# Patient Record
Sex: Female | Born: 1950 | Race: White | Hispanic: No | Marital: Married | Smoking: Former smoker
Health system: Southern US, Community
[De-identification: ages and names within clinical notes are randomized; demographics above are authoritative.]

## PROBLEM LIST (undated history)

## (undated) ENCOUNTER — Emergency Department (HOSPITAL_COMMUNITY): Admission: EM | Discharge status: Absent without leave | Payer: Medicare Other

## (undated) DIAGNOSIS — D509 Iron deficiency anemia, unspecified: Secondary | ICD-10-CM

## (undated) DIAGNOSIS — T4145XA Adverse effect of unspecified anesthetic, initial encounter: Secondary | ICD-10-CM

## (undated) DIAGNOSIS — G5603 Carpal tunnel syndrome, bilateral upper limbs: Secondary | ICD-10-CM

## (undated) DIAGNOSIS — C649 Malignant neoplasm of unspecified kidney, except renal pelvis: Secondary | ICD-10-CM

## (undated) DIAGNOSIS — F32A Depression, unspecified: Secondary | ICD-10-CM

## (undated) DIAGNOSIS — IMO0002 Reserved for concepts with insufficient information to code with codable children: Secondary | ICD-10-CM

## (undated) DIAGNOSIS — N189 Chronic kidney disease, unspecified: Secondary | ICD-10-CM

## (undated) DIAGNOSIS — D649 Anemia, unspecified: Secondary | ICD-10-CM

## (undated) DIAGNOSIS — Z8679 Personal history of other diseases of the circulatory system: Secondary | ICD-10-CM

## (undated) DIAGNOSIS — I872 Venous insufficiency (chronic) (peripheral): Secondary | ICD-10-CM

## (undated) DIAGNOSIS — M1990 Unspecified osteoarthritis, unspecified site: Secondary | ICD-10-CM

## (undated) DIAGNOSIS — I2510 Atherosclerotic heart disease of native coronary artery without angina pectoris: Secondary | ICD-10-CM

## (undated) DIAGNOSIS — E039 Hypothyroidism, unspecified: Secondary | ICD-10-CM

## (undated) DIAGNOSIS — C4359 Malignant melanoma of other part of trunk: Secondary | ICD-10-CM

## (undated) DIAGNOSIS — R079 Chest pain, unspecified: Secondary | ICD-10-CM

## (undated) DIAGNOSIS — F329 Major depressive disorder, single episode, unspecified: Secondary | ICD-10-CM

## (undated) DIAGNOSIS — S72001A Fracture of unspecified part of neck of right femur, initial encounter for closed fracture: Secondary | ICD-10-CM

## (undated) DIAGNOSIS — F419 Anxiety disorder, unspecified: Secondary | ICD-10-CM

## (undated) DIAGNOSIS — R51 Headache: Secondary | ICD-10-CM

## (undated) DIAGNOSIS — M545 Low back pain, unspecified: Secondary | ICD-10-CM

## (undated) DIAGNOSIS — I509 Heart failure, unspecified: Secondary | ICD-10-CM

## (undated) DIAGNOSIS — C3490 Malignant neoplasm of unspecified part of unspecified bronchus or lung: Secondary | ICD-10-CM

## (undated) DIAGNOSIS — J189 Pneumonia, unspecified organism: Secondary | ICD-10-CM

## (undated) DIAGNOSIS — T8859XA Other complications of anesthesia, initial encounter: Secondary | ICD-10-CM

## (undated) DIAGNOSIS — G8929 Other chronic pain: Secondary | ICD-10-CM

## (undated) DIAGNOSIS — M797 Fibromyalgia: Secondary | ICD-10-CM

## (undated) DIAGNOSIS — R0600 Dyspnea, unspecified: Secondary | ICD-10-CM

## (undated) DIAGNOSIS — Z8543 Personal history of malignant neoplasm of ovary: Secondary | ICD-10-CM

## (undated) DIAGNOSIS — G43909 Migraine, unspecified, not intractable, without status migrainosus: Secondary | ICD-10-CM

## (undated) DIAGNOSIS — F0390 Unspecified dementia without behavioral disturbance: Secondary | ICD-10-CM

## (undated) HISTORY — PX: MELANOMA EXCISION: SHX5266

## (undated) HISTORY — DX: Anxiety disorder, unspecified: F41.9

## (undated) HISTORY — DX: Unspecified osteoarthritis, unspecified site: M19.90

## (undated) HISTORY — PX: APPENDECTOMY: SHX54

## (undated) HISTORY — DX: Chronic kidney disease, unspecified: N18.9

## (undated) HISTORY — PX: TOTAL KNEE ARTHROPLASTY: SHX125

## (undated) HISTORY — PX: TOTAL SHOULDER REPLACEMENT: SUR1217

## (undated) HISTORY — PX: TUBAL LIGATION: SHX77

## (undated) HISTORY — DX: Anemia, unspecified: D64.9

## (undated) HISTORY — PX: KNEE ARTHROSCOPY: SHX127

## (undated) HISTORY — PX: SHOULDER ARTHROSCOPY W/ ROTATOR CUFF REPAIR: SHX2400

## (undated) HISTORY — PX: ORIF HIP FRACTURE: SHX2125

## (undated) HISTORY — PX: POSTERIOR LUMBAR FUSION: SHX6036

## (undated) HISTORY — PX: REDUCTION MAMMAPLASTY: SUR839

## (undated) HISTORY — PX: FRACTURE SURGERY: SHX138

## (undated) HISTORY — DX: Heart failure, unspecified: I50.9

## (undated) HISTORY — PX: PARTIAL NEPHRECTOMY: SHX414

## (undated) HISTORY — PX: RIGHT OOPHORECTOMY: SHX2359

## (undated) HISTORY — PX: PERICARDIAL WINDOW: SHX2213

---
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
## 1977-10-23 HISTORY — PX: ABDOMINAL HYSTERECTOMY: SHX81

## 1977-10-23 HISTORY — PX: LEFT OOPHORECTOMY: SHX1961

## 1982-10-23 HISTORY — PX: LUMBAR DISC SURGERY: SHX700

## 1995-08-15 ENCOUNTER — Encounter: Payer: Self-pay | Admitting: Internal Medicine

## 1995-08-17 ENCOUNTER — Encounter: Payer: Self-pay | Admitting: Internal Medicine

## 1998-02-11 ENCOUNTER — Other Ambulatory Visit: Admission: RE | Admit: 1998-02-11 | Discharge: 1998-02-11 | Payer: Self-pay | Admitting: Sports Medicine

## 1998-02-22 ENCOUNTER — Other Ambulatory Visit: Admission: RE | Admit: 1998-02-22 | Discharge: 1998-02-22 | Payer: Self-pay | Admitting: Obstetrics and Gynecology

## 1998-03-08 ENCOUNTER — Ambulatory Visit (HOSPITAL_COMMUNITY): Admission: RE | Admit: 1998-03-08 | Discharge: 1998-03-08 | Payer: Self-pay | Admitting: Orthopedic Surgery

## 1998-03-16 ENCOUNTER — Ambulatory Visit (HOSPITAL_COMMUNITY): Admission: RE | Admit: 1998-03-16 | Discharge: 1998-03-16 | Payer: Self-pay | Admitting: Orthopedic Surgery

## 1998-04-19 ENCOUNTER — Ambulatory Visit (HOSPITAL_COMMUNITY): Admission: RE | Admit: 1998-04-19 | Discharge: 1998-04-19 | Payer: Self-pay | Admitting: Orthopedic Surgery

## 1998-04-27 ENCOUNTER — Ambulatory Visit (HOSPITAL_COMMUNITY): Admission: RE | Admit: 1998-04-27 | Discharge: 1998-04-27 | Payer: Self-pay | Admitting: Orthopedic Surgery

## 1998-06-14 ENCOUNTER — Ambulatory Visit (HOSPITAL_COMMUNITY): Admission: RE | Admit: 1998-06-14 | Discharge: 1998-06-14 | Payer: Self-pay | Admitting: Neurosurgery

## 1998-10-19 ENCOUNTER — Other Ambulatory Visit: Admission: RE | Admit: 1998-10-19 | Discharge: 1998-10-19 | Payer: Self-pay | Admitting: Obstetrics and Gynecology

## 1999-01-21 ENCOUNTER — Other Ambulatory Visit: Admission: RE | Admit: 1999-01-21 | Discharge: 1999-01-21 | Payer: Self-pay | Admitting: *Deleted

## 1999-07-10 ENCOUNTER — Ambulatory Visit (HOSPITAL_COMMUNITY)
Admission: RE | Admit: 1999-07-10 | Discharge: 1999-07-10 | Payer: Self-pay | Admitting: Physical Medicine & Rehabilitation

## 1999-07-10 ENCOUNTER — Encounter: Payer: Self-pay | Admitting: Physical Medicine & Rehabilitation

## 1999-08-26 ENCOUNTER — Encounter: Payer: Self-pay | Admitting: Internal Medicine

## 1999-09-24 ENCOUNTER — Ambulatory Visit (HOSPITAL_COMMUNITY)
Admission: RE | Admit: 1999-09-24 | Discharge: 1999-09-24 | Payer: Self-pay | Admitting: Physical Medicine & Rehabilitation

## 1999-09-24 ENCOUNTER — Encounter: Payer: Self-pay | Admitting: Physical Medicine & Rehabilitation

## 1999-10-12 ENCOUNTER — Other Ambulatory Visit: Admission: RE | Admit: 1999-10-12 | Discharge: 1999-10-12 | Payer: Self-pay | Admitting: Obstetrics and Gynecology

## 2000-02-28 ENCOUNTER — Ambulatory Visit (HOSPITAL_COMMUNITY)
Admission: RE | Admit: 2000-02-28 | Discharge: 2000-02-28 | Payer: Self-pay | Admitting: Physical Medicine & Rehabilitation

## 2000-02-28 ENCOUNTER — Encounter: Payer: Self-pay | Admitting: Physical Medicine & Rehabilitation

## 2000-04-10 ENCOUNTER — Encounter: Payer: Self-pay | Admitting: Internal Medicine

## 2000-04-12 ENCOUNTER — Encounter (INDEPENDENT_AMBULATORY_CARE_PROVIDER_SITE_OTHER): Payer: Self-pay | Admitting: Specialist

## 2000-04-12 ENCOUNTER — Encounter (INDEPENDENT_AMBULATORY_CARE_PROVIDER_SITE_OTHER): Payer: Self-pay | Admitting: *Deleted

## 2000-04-12 ENCOUNTER — Encounter: Payer: Self-pay | Admitting: Internal Medicine

## 2000-04-12 ENCOUNTER — Other Ambulatory Visit: Admission: RE | Admit: 2000-04-12 | Discharge: 2000-04-12 | Payer: Self-pay | Admitting: Internal Medicine

## 2000-04-13 ENCOUNTER — Encounter: Payer: Self-pay | Admitting: Internal Medicine

## 2000-04-13 ENCOUNTER — Ambulatory Visit (HOSPITAL_COMMUNITY): Admission: RE | Admit: 2000-04-13 | Discharge: 2000-04-13 | Payer: Self-pay | Admitting: Internal Medicine

## 2000-04-13 ENCOUNTER — Encounter (INDEPENDENT_AMBULATORY_CARE_PROVIDER_SITE_OTHER): Payer: Self-pay | Admitting: *Deleted

## 2000-05-25 ENCOUNTER — Emergency Department (HOSPITAL_COMMUNITY): Admission: EM | Admit: 2000-05-25 | Discharge: 2000-05-25 | Payer: Self-pay | Admitting: Emergency Medicine

## 2000-05-25 ENCOUNTER — Encounter: Payer: Self-pay | Admitting: Emergency Medicine

## 2000-07-27 ENCOUNTER — Encounter: Payer: Self-pay | Admitting: Physical Medicine & Rehabilitation

## 2000-07-27 ENCOUNTER — Ambulatory Visit (HOSPITAL_COMMUNITY)
Admission: RE | Admit: 2000-07-27 | Discharge: 2000-07-27 | Payer: Self-pay | Admitting: Physical Medicine & Rehabilitation

## 2000-07-27 ENCOUNTER — Encounter (INDEPENDENT_AMBULATORY_CARE_PROVIDER_SITE_OTHER): Payer: Self-pay | Admitting: *Deleted

## 2000-08-07 ENCOUNTER — Ambulatory Visit (HOSPITAL_COMMUNITY): Admission: RE | Admit: 2000-08-07 | Discharge: 2000-08-07 | Payer: Self-pay | Admitting: Hematology & Oncology

## 2000-08-07 ENCOUNTER — Encounter (INDEPENDENT_AMBULATORY_CARE_PROVIDER_SITE_OTHER): Payer: Self-pay | Admitting: *Deleted

## 2000-12-07 ENCOUNTER — Other Ambulatory Visit: Admission: RE | Admit: 2000-12-07 | Discharge: 2000-12-07 | Payer: Self-pay | Admitting: Obstetrics and Gynecology

## 2001-01-26 ENCOUNTER — Ambulatory Visit (HOSPITAL_COMMUNITY)
Admission: RE | Admit: 2001-01-26 | Discharge: 2001-01-26 | Payer: Self-pay | Admitting: Physical Medicine & Rehabilitation

## 2001-01-26 ENCOUNTER — Encounter: Payer: Self-pay | Admitting: Physical Medicine & Rehabilitation

## 2001-02-28 ENCOUNTER — Encounter
Admission: RE | Admit: 2001-02-28 | Discharge: 2001-02-28 | Payer: Self-pay | Admitting: Physical Medicine & Rehabilitation

## 2001-02-28 ENCOUNTER — Encounter: Payer: Self-pay | Admitting: Physical Medicine & Rehabilitation

## 2001-03-20 ENCOUNTER — Encounter: Payer: Self-pay | Admitting: Physical Medicine & Rehabilitation

## 2001-03-20 ENCOUNTER — Encounter
Admission: RE | Admit: 2001-03-20 | Discharge: 2001-03-20 | Payer: Self-pay | Admitting: Physical Medicine & Rehabilitation

## 2001-08-12 ENCOUNTER — Ambulatory Visit (HOSPITAL_COMMUNITY)
Admission: RE | Admit: 2001-08-12 | Discharge: 2001-08-12 | Payer: Self-pay | Admitting: Physical Medicine & Rehabilitation

## 2001-08-12 ENCOUNTER — Encounter: Payer: Self-pay | Admitting: Physical Medicine & Rehabilitation

## 2001-10-26 ENCOUNTER — Encounter: Payer: Self-pay | Admitting: Physical Medicine & Rehabilitation

## 2001-10-26 ENCOUNTER — Ambulatory Visit (HOSPITAL_COMMUNITY)
Admission: RE | Admit: 2001-10-26 | Discharge: 2001-10-26 | Payer: Self-pay | Admitting: Physical Medicine & Rehabilitation

## 2002-01-06 ENCOUNTER — Inpatient Hospital Stay (HOSPITAL_COMMUNITY): Admission: RE | Admit: 2002-01-06 | Discharge: 2002-01-11 | Payer: Self-pay | Admitting: Neurological Surgery

## 2002-01-06 ENCOUNTER — Encounter: Payer: Self-pay | Admitting: Neurological Surgery

## 2002-05-06 ENCOUNTER — Encounter
Admission: RE | Admit: 2002-05-06 | Discharge: 2002-08-04 | Payer: Self-pay | Admitting: Physical Medicine & Rehabilitation

## 2002-07-07 ENCOUNTER — Encounter
Admission: RE | Admit: 2002-07-07 | Discharge: 2002-07-07 | Payer: Self-pay | Admitting: Physical Medicine & Rehabilitation

## 2002-07-07 ENCOUNTER — Encounter: Payer: Self-pay | Admitting: Physical Medicine & Rehabilitation

## 2002-08-05 ENCOUNTER — Encounter
Admission: RE | Admit: 2002-08-05 | Discharge: 2002-08-14 | Payer: Self-pay | Admitting: Physical Medicine & Rehabilitation

## 2003-02-18 ENCOUNTER — Encounter
Admission: RE | Admit: 2003-02-18 | Discharge: 2003-05-19 | Payer: Self-pay | Admitting: Physical Medicine & Rehabilitation

## 2003-06-02 ENCOUNTER — Encounter: Payer: Self-pay | Admitting: Physical Medicine & Rehabilitation

## 2003-06-02 ENCOUNTER — Ambulatory Visit (HOSPITAL_COMMUNITY)
Admission: RE | Admit: 2003-06-02 | Discharge: 2003-06-02 | Payer: Self-pay | Admitting: Physical Medicine & Rehabilitation

## 2003-06-09 ENCOUNTER — Encounter
Admission: RE | Admit: 2003-06-09 | Discharge: 2003-09-07 | Payer: Self-pay | Admitting: Physical Medicine & Rehabilitation

## 2003-07-31 ENCOUNTER — Encounter: Admission: RE | Admit: 2003-07-31 | Discharge: 2003-07-31 | Payer: Self-pay | Admitting: Emergency Medicine

## 2003-07-31 ENCOUNTER — Encounter: Payer: Self-pay | Admitting: Emergency Medicine

## 2003-11-03 ENCOUNTER — Encounter
Admission: RE | Admit: 2003-11-03 | Discharge: 2004-02-01 | Payer: Self-pay | Admitting: Physical Medicine & Rehabilitation

## 2003-12-10 ENCOUNTER — Other Ambulatory Visit: Admission: RE | Admit: 2003-12-10 | Discharge: 2003-12-10 | Payer: Self-pay | Admitting: Obstetrics and Gynecology

## 2004-01-18 ENCOUNTER — Encounter (INDEPENDENT_AMBULATORY_CARE_PROVIDER_SITE_OTHER): Payer: Self-pay | Admitting: Specialist

## 2004-01-18 ENCOUNTER — Inpatient Hospital Stay (HOSPITAL_COMMUNITY): Admission: RE | Admit: 2004-01-18 | Discharge: 2004-01-22 | Payer: Self-pay | Admitting: Urology

## 2004-02-01 ENCOUNTER — Encounter
Admission: RE | Admit: 2004-02-01 | Discharge: 2004-05-01 | Payer: Self-pay | Admitting: Physical Medicine & Rehabilitation

## 2004-05-02 ENCOUNTER — Ambulatory Visit (HOSPITAL_COMMUNITY)
Admission: RE | Admit: 2004-05-02 | Discharge: 2004-05-02 | Payer: Self-pay | Admitting: Physical Medicine & Rehabilitation

## 2004-06-30 ENCOUNTER — Encounter
Admission: RE | Admit: 2004-06-30 | Discharge: 2004-08-26 | Payer: Self-pay | Admitting: Physical Medicine & Rehabilitation

## 2004-07-04 ENCOUNTER — Ambulatory Visit: Payer: Self-pay | Admitting: Physical Medicine & Rehabilitation

## 2004-07-12 ENCOUNTER — Encounter (INDEPENDENT_AMBULATORY_CARE_PROVIDER_SITE_OTHER): Payer: Self-pay | Admitting: *Deleted

## 2004-07-12 ENCOUNTER — Inpatient Hospital Stay (HOSPITAL_COMMUNITY): Admission: EM | Admit: 2004-07-12 | Discharge: 2004-07-14 | Payer: Self-pay | Admitting: Obstetrics and Gynecology

## 2004-08-03 ENCOUNTER — Encounter: Admission: RE | Admit: 2004-08-03 | Discharge: 2004-08-03 | Payer: Self-pay | Admitting: Obstetrics and Gynecology

## 2004-08-23 ENCOUNTER — Ambulatory Visit: Admission: RE | Admit: 2004-08-23 | Discharge: 2004-08-23 | Payer: Self-pay | Admitting: Gynecologic Oncology

## 2004-08-26 ENCOUNTER — Encounter
Admission: RE | Admit: 2004-08-26 | Discharge: 2004-10-27 | Payer: Self-pay | Admitting: Physical Medicine & Rehabilitation

## 2004-10-27 ENCOUNTER — Encounter
Admission: RE | Admit: 2004-10-27 | Discharge: 2005-01-25 | Payer: Self-pay | Admitting: Physical Medicine & Rehabilitation

## 2004-10-31 ENCOUNTER — Ambulatory Visit: Payer: Self-pay | Admitting: Physical Medicine & Rehabilitation

## 2004-11-04 ENCOUNTER — Ambulatory Visit (HOSPITAL_COMMUNITY)
Admission: RE | Admit: 2004-11-04 | Discharge: 2004-11-04 | Payer: Self-pay | Admitting: Physical Medicine & Rehabilitation

## 2005-01-04 ENCOUNTER — Encounter (INDEPENDENT_AMBULATORY_CARE_PROVIDER_SITE_OTHER): Payer: Self-pay | Admitting: *Deleted

## 2005-01-04 ENCOUNTER — Ambulatory Visit (HOSPITAL_COMMUNITY): Admission: RE | Admit: 2005-01-04 | Discharge: 2005-01-04 | Payer: Self-pay | Admitting: Obstetrics and Gynecology

## 2005-01-25 ENCOUNTER — Encounter
Admission: RE | Admit: 2005-01-25 | Discharge: 2005-04-25 | Payer: Self-pay | Admitting: Physical Medicine & Rehabilitation

## 2005-01-30 ENCOUNTER — Ambulatory Visit: Payer: Self-pay | Admitting: Physical Medicine & Rehabilitation

## 2005-02-13 ENCOUNTER — Ambulatory Visit (HOSPITAL_BASED_OUTPATIENT_CLINIC_OR_DEPARTMENT_OTHER): Admission: RE | Admit: 2005-02-13 | Discharge: 2005-02-13 | Payer: Self-pay | Admitting: Urology

## 2005-02-13 ENCOUNTER — Ambulatory Visit (HOSPITAL_COMMUNITY): Admission: RE | Admit: 2005-02-13 | Discharge: 2005-02-13 | Payer: Self-pay | Admitting: Urology

## 2005-04-24 ENCOUNTER — Encounter
Admission: RE | Admit: 2005-04-24 | Discharge: 2005-07-23 | Payer: Self-pay | Admitting: Physical Medicine & Rehabilitation

## 2005-04-24 ENCOUNTER — Ambulatory Visit: Payer: Self-pay | Admitting: Physical Medicine & Rehabilitation

## 2005-06-28 ENCOUNTER — Ambulatory Visit: Payer: Self-pay | Admitting: Physical Medicine & Rehabilitation

## 2005-07-17 ENCOUNTER — Encounter: Admission: RE | Admit: 2005-07-17 | Discharge: 2005-07-17 | Payer: Self-pay | Admitting: Orthopedic Surgery

## 2005-08-23 ENCOUNTER — Encounter
Admission: RE | Admit: 2005-08-23 | Discharge: 2005-11-21 | Payer: Self-pay | Admitting: Physical Medicine & Rehabilitation

## 2005-08-23 ENCOUNTER — Ambulatory Visit: Payer: Self-pay | Admitting: Physical Medicine & Rehabilitation

## 2005-09-18 ENCOUNTER — Ambulatory Visit (HOSPITAL_COMMUNITY): Admission: RE | Admit: 2005-09-18 | Discharge: 2005-09-18 | Payer: Self-pay | Admitting: Obstetrics and Gynecology

## 2005-09-18 ENCOUNTER — Encounter (INDEPENDENT_AMBULATORY_CARE_PROVIDER_SITE_OTHER): Payer: Self-pay | Admitting: *Deleted

## 2005-10-25 ENCOUNTER — Other Ambulatory Visit: Admission: RE | Admit: 2005-10-25 | Discharge: 2005-10-25 | Payer: Self-pay | Admitting: Obstetrics and Gynecology

## 2005-11-17 ENCOUNTER — Ambulatory Visit: Payer: Self-pay | Admitting: Physical Medicine & Rehabilitation

## 2006-01-15 ENCOUNTER — Encounter
Admission: RE | Admit: 2006-01-15 | Discharge: 2006-04-15 | Payer: Self-pay | Admitting: Physical Medicine & Rehabilitation

## 2006-01-15 ENCOUNTER — Ambulatory Visit: Payer: Self-pay | Admitting: Physical Medicine & Rehabilitation

## 2006-01-22 ENCOUNTER — Encounter: Admission: RE | Admit: 2006-01-22 | Discharge: 2006-01-22 | Payer: Self-pay | Admitting: Urology

## 2006-02-21 ENCOUNTER — Encounter: Payer: Self-pay | Admitting: Neurosurgery

## 2006-05-09 ENCOUNTER — Ambulatory Visit: Payer: Self-pay | Admitting: Physical Medicine & Rehabilitation

## 2006-05-09 ENCOUNTER — Encounter
Admission: RE | Admit: 2006-05-09 | Discharge: 2006-08-07 | Payer: Self-pay | Admitting: Physical Medicine & Rehabilitation

## 2006-08-09 ENCOUNTER — Inpatient Hospital Stay (HOSPITAL_COMMUNITY): Admission: RE | Admit: 2006-08-09 | Discharge: 2006-08-12 | Payer: Self-pay | Admitting: Specialist

## 2006-09-05 ENCOUNTER — Encounter
Admission: RE | Admit: 2006-09-05 | Discharge: 2006-12-04 | Payer: Self-pay | Admitting: Physical Medicine & Rehabilitation

## 2006-09-05 ENCOUNTER — Ambulatory Visit: Payer: Self-pay | Admitting: Physical Medicine & Rehabilitation

## 2006-11-27 ENCOUNTER — Encounter
Admission: RE | Admit: 2006-11-27 | Discharge: 2007-02-25 | Payer: Self-pay | Admitting: Physical Medicine & Rehabilitation

## 2006-11-27 ENCOUNTER — Ambulatory Visit: Payer: Self-pay | Admitting: Physical Medicine & Rehabilitation

## 2006-11-29 ENCOUNTER — Encounter (INDEPENDENT_AMBULATORY_CARE_PROVIDER_SITE_OTHER): Payer: Self-pay | Admitting: *Deleted

## 2006-11-29 ENCOUNTER — Ambulatory Visit (HOSPITAL_COMMUNITY): Admission: RE | Admit: 2006-11-29 | Discharge: 2006-11-29 | Payer: Self-pay | Admitting: Neurological Surgery

## 2007-02-19 ENCOUNTER — Ambulatory Visit: Payer: Self-pay | Admitting: Physical Medicine & Rehabilitation

## 2007-04-12 ENCOUNTER — Ambulatory Visit: Payer: Self-pay | Admitting: Physical Medicine & Rehabilitation

## 2007-04-12 ENCOUNTER — Encounter
Admission: RE | Admit: 2007-04-12 | Discharge: 2007-07-11 | Payer: Self-pay | Admitting: Physical Medicine & Rehabilitation

## 2007-06-19 ENCOUNTER — Ambulatory Visit (HOSPITAL_COMMUNITY): Admission: RE | Admit: 2007-06-19 | Discharge: 2007-06-19 | Payer: Self-pay | Admitting: Obstetrics and Gynecology

## 2007-06-19 ENCOUNTER — Encounter (INDEPENDENT_AMBULATORY_CARE_PROVIDER_SITE_OTHER): Payer: Self-pay | Admitting: *Deleted

## 2007-07-22 ENCOUNTER — Ambulatory Visit: Payer: Self-pay | Admitting: Physical Medicine & Rehabilitation

## 2007-07-23 ENCOUNTER — Encounter
Admission: RE | Admit: 2007-07-23 | Discharge: 2007-07-24 | Payer: Self-pay | Admitting: Physical Medicine & Rehabilitation

## 2007-09-09 ENCOUNTER — Encounter (INDEPENDENT_AMBULATORY_CARE_PROVIDER_SITE_OTHER): Payer: Self-pay | Admitting: Urology

## 2007-09-09 ENCOUNTER — Inpatient Hospital Stay (HOSPITAL_COMMUNITY): Admission: RE | Admit: 2007-09-09 | Discharge: 2007-09-12 | Payer: Self-pay | Admitting: Urology

## 2007-10-08 ENCOUNTER — Ambulatory Visit: Payer: Self-pay | Admitting: Physical Medicine & Rehabilitation

## 2007-10-08 ENCOUNTER — Encounter
Admission: RE | Admit: 2007-10-08 | Discharge: 2007-10-10 | Payer: Self-pay | Admitting: Physical Medicine & Rehabilitation

## 2007-12-27 ENCOUNTER — Ambulatory Visit: Payer: Self-pay | Admitting: Oncology

## 2008-01-06 LAB — CBC WITH DIFFERENTIAL/PLATELET
BASO%: 0.4 % (ref 0.0–2.0)
Basophils Absolute: 0 10*3/uL (ref 0.0–0.1)
EOS%: 0.5 % (ref 0.0–7.0)
Eosinophils Absolute: 0 10*3/uL (ref 0.0–0.5)
HCT: 34.5 % — ABNORMAL LOW (ref 34.8–46.6)
HGB: 11.8 g/dL (ref 11.6–15.9)
LYMPH%: 29.7 % (ref 14.0–48.0)
MCH: 28.1 pg (ref 26.0–34.0)
MCHC: 34.2 g/dL (ref 32.0–36.0)
MCV: 82.3 fL (ref 81.0–101.0)
MONO#: 0.3 10*3/uL (ref 0.1–0.9)
MONO%: 3.8 % (ref 0.0–13.0)
NEUT#: 4.7 10*3/uL (ref 1.5–6.5)
NEUT%: 65.6 % (ref 39.6–76.8)
Platelets: 212 10*3/uL (ref 145–400)
RBC: 4.2 10*6/uL (ref 3.70–5.32)
RDW: 16.5 % — ABNORMAL HIGH (ref 11.3–14.5)
WBC: 7.2 10*3/uL (ref 3.9–10.0)
lymph#: 2.1 10*3/uL (ref 0.9–3.3)

## 2008-01-06 LAB — COMPREHENSIVE METABOLIC PANEL
ALT: 14 U/L (ref 0–35)
AST: 23 U/L (ref 0–37)
Albumin: 4.2 g/dL (ref 3.5–5.2)
Alkaline Phosphatase: 69 U/L (ref 39–117)
BUN: 22 mg/dL (ref 6–23)
CO2: 28 mEq/L (ref 19–32)
Calcium: 9.3 mg/dL (ref 8.4–10.5)
Chloride: 99 mEq/L (ref 96–112)
Creatinine, Ser: 0.82 mg/dL (ref 0.40–1.20)
Glucose, Bld: 102 mg/dL — ABNORMAL HIGH (ref 70–99)
Potassium: 4 mEq/L (ref 3.5–5.3)
Sodium: 141 mEq/L (ref 135–145)
Total Bilirubin: 0.3 mg/dL (ref 0.3–1.2)
Total Protein: 7.1 g/dL (ref 6.0–8.3)

## 2008-01-06 LAB — LACTATE DEHYDROGENASE: LDH: 172 U/L (ref 94–250)

## 2008-01-09 LAB — IRON AND TIBC
%SAT: 19 % — ABNORMAL LOW (ref 20–55)
Iron: 80 ug/dL (ref 42–145)
TIBC: 431 ug/dL (ref 250–470)
UIBC: 351 ug/dL

## 2008-01-09 LAB — VITAMIN B12: Vitamin B-12: 401 pg/mL (ref 211–911)

## 2008-01-09 LAB — TRANSFERRIN RECEPTOR, SOLUABLE: Transferrin Receptor, Soluble: 24 nmol/L

## 2008-01-09 LAB — FERRITIN: Ferritin: 11 ng/mL (ref 10–291)

## 2008-01-29 ENCOUNTER — Encounter
Admission: RE | Admit: 2008-01-29 | Discharge: 2008-02-03 | Payer: Self-pay | Admitting: Physical Medicine & Rehabilitation

## 2008-02-03 ENCOUNTER — Ambulatory Visit: Payer: Self-pay | Admitting: Physical Medicine & Rehabilitation

## 2008-02-06 LAB — CBC WITH DIFFERENTIAL/PLATELET
BASO%: 0.5 % (ref 0.0–2.0)
Basophils Absolute: 0 10*3/uL (ref 0.0–0.1)
EOS%: 2.6 % (ref 0.0–7.0)
Eosinophils Absolute: 0.1 10*3/uL (ref 0.0–0.5)
HCT: 33.9 % — ABNORMAL LOW (ref 34.8–46.6)
HGB: 11.7 g/dL (ref 11.6–15.9)
LYMPH%: 40.8 % (ref 14.0–48.0)
MCH: 29.9 pg (ref 26.0–34.0)
MCHC: 34.4 g/dL (ref 32.0–36.0)
MCV: 87.1 fL (ref 81.0–101.0)
MONO#: 0.4 10*3/uL (ref 0.1–0.9)
MONO%: 7.8 % (ref 0.0–13.0)
NEUT#: 2.7 10*3/uL (ref 1.5–6.5)
NEUT%: 48.3 % (ref 39.6–76.8)
Platelets: 205 10*3/uL (ref 145–400)
RBC: 3.89 10*6/uL (ref 3.70–5.32)
RDW: 18.4 % — ABNORMAL HIGH (ref 11.3–14.5)
WBC: 5.5 10*3/uL (ref 3.9–10.0)
lymph#: 2.3 10*3/uL (ref 0.9–3.3)

## 2008-02-06 LAB — ERYTHROCYTE SEDIMENTATION RATE: Sed Rate: 61 mm/hr — ABNORMAL HIGH (ref 0–30)

## 2008-02-08 LAB — COMPREHENSIVE METABOLIC PANEL
ALT: 21 U/L (ref 0–35)
AST: 15 U/L (ref 0–37)
Albumin: 3.7 g/dL (ref 3.5–5.2)
Alkaline Phosphatase: 74 U/L (ref 39–117)
BUN: 21 mg/dL (ref 6–23)
CO2: 29 mEq/L (ref 19–32)
Calcium: 8.8 mg/dL (ref 8.4–10.5)
Chloride: 101 mEq/L (ref 96–112)
Creatinine, Ser: 0.77 mg/dL (ref 0.40–1.20)
Glucose, Bld: 98 mg/dL (ref 70–99)
Potassium: 3.7 mEq/L (ref 3.5–5.3)
Sodium: 142 mEq/L (ref 135–145)
Total Bilirubin: 0.3 mg/dL (ref 0.3–1.2)
Total Protein: 6.5 g/dL (ref 6.0–8.3)

## 2008-02-08 LAB — IRON AND TIBC
%SAT: 24 % (ref 20–55)
Iron: 69 ug/dL (ref 42–145)
TIBC: 286 ug/dL (ref 250–470)
UIBC: 217 ug/dL

## 2008-02-08 LAB — FERRITIN: Ferritin: 577 ng/mL — ABNORMAL HIGH (ref 10–291)

## 2008-02-08 LAB — LACTATE DEHYDROGENASE: LDH: 152 U/L (ref 94–250)

## 2008-02-08 LAB — TRANSFERRIN RECEPTOR, SOLUABLE: Transferrin Receptor, Soluble: 19.3 nmol/L

## 2008-02-10 LAB — HEMOGLOBINOPATHY EVALUATION
Hemoglobin Other: 0 % (ref 0.0–0.0)
Hgb A2 Quant: 2 % — ABNORMAL LOW (ref 2.2–3.2)
Hgb A: 98 % — ABNORMAL HIGH (ref 96.8–97.8)
Hgb F Quant: 0 % (ref 0.0–2.0)
Hgb S Quant: 0 % (ref 0.0–0.0)

## 2008-03-03 ENCOUNTER — Ambulatory Visit: Payer: Self-pay | Admitting: Internal Medicine

## 2008-03-03 ENCOUNTER — Ambulatory Visit: Payer: Self-pay | Admitting: Family Medicine

## 2008-03-03 ENCOUNTER — Inpatient Hospital Stay (HOSPITAL_COMMUNITY): Admission: EM | Admit: 2008-03-03 | Discharge: 2008-03-09 | Payer: Self-pay | Admitting: Emergency Medicine

## 2008-03-04 ENCOUNTER — Encounter (INDEPENDENT_AMBULATORY_CARE_PROVIDER_SITE_OTHER): Payer: Self-pay | Admitting: Family Medicine

## 2008-03-05 ENCOUNTER — Ambulatory Visit: Payer: Self-pay | Admitting: Thoracic Surgery (Cardiothoracic Vascular Surgery)

## 2008-03-05 ENCOUNTER — Encounter (INDEPENDENT_AMBULATORY_CARE_PROVIDER_SITE_OTHER): Payer: Self-pay | Admitting: *Deleted

## 2008-03-06 ENCOUNTER — Encounter: Payer: Self-pay | Admitting: Thoracic Surgery (Cardiothoracic Vascular Surgery)

## 2008-03-18 ENCOUNTER — Ambulatory Visit: Payer: Self-pay | Admitting: Oncology

## 2008-03-23 ENCOUNTER — Inpatient Hospital Stay (HOSPITAL_COMMUNITY): Admission: EM | Admit: 2008-03-23 | Discharge: 2008-03-24 | Payer: Self-pay | Admitting: Emergency Medicine

## 2008-03-23 ENCOUNTER — Ambulatory Visit: Payer: Self-pay | Admitting: Internal Medicine

## 2008-03-24 ENCOUNTER — Encounter (INDEPENDENT_AMBULATORY_CARE_PROVIDER_SITE_OTHER): Payer: Self-pay | Admitting: *Deleted

## 2008-03-24 ENCOUNTER — Encounter (INDEPENDENT_AMBULATORY_CARE_PROVIDER_SITE_OTHER): Payer: Self-pay | Admitting: Internal Medicine

## 2008-03-27 LAB — COMPREHENSIVE METABOLIC PANEL
ALT: 19 U/L (ref 0–35)
AST: 20 U/L (ref 0–37)
Albumin: 3.6 g/dL (ref 3.5–5.2)
Alkaline Phosphatase: 93 U/L (ref 39–117)
BUN: 27 mg/dL — ABNORMAL HIGH (ref 6–23)
CO2: 25 mEq/L (ref 19–32)
Calcium: 9.6 mg/dL (ref 8.4–10.5)
Chloride: 99 mEq/L (ref 96–112)
Creatinine, Ser: 0.94 mg/dL (ref 0.40–1.20)
Glucose, Bld: 112 mg/dL — ABNORMAL HIGH (ref 70–99)
Potassium: 3.5 mEq/L (ref 3.5–5.3)
Sodium: 140 mEq/L (ref 135–145)
Total Bilirubin: 0.3 mg/dL (ref 0.3–1.2)
Total Protein: 7.2 g/dL (ref 6.0–8.3)

## 2008-03-27 LAB — CBC WITH DIFFERENTIAL/PLATELET
BASO%: 0.3 % (ref 0.0–2.0)
Basophils Absolute: 0 10*3/uL (ref 0.0–0.1)
EOS%: 1.8 % (ref 0.0–7.0)
Eosinophils Absolute: 0.1 10*3/uL (ref 0.0–0.5)
HCT: 30.6 % — ABNORMAL LOW (ref 34.8–46.6)
HGB: 10.1 g/dL — ABNORMAL LOW (ref 11.6–15.9)
LYMPH%: 26.3 % (ref 14.0–48.0)
MCH: 29.4 pg (ref 26.0–34.0)
MCHC: 33 g/dL (ref 32.0–36.0)
MCV: 88.9 fL (ref 81.0–101.0)
MONO#: 0.4 10*3/uL (ref 0.1–0.9)
MONO%: 5.5 % (ref 0.0–13.0)
NEUT#: 5 10*3/uL (ref 1.5–6.5)
NEUT%: 66.1 % (ref 39.6–76.8)
Platelets: 373 10*3/uL (ref 145–400)
RBC: 3.44 10*6/uL — ABNORMAL LOW (ref 3.70–5.32)
RDW: 15.4 % — ABNORMAL HIGH (ref 11.3–14.5)
WBC: 7.6 10*3/uL (ref 3.9–10.0)
lymph#: 2 10*3/uL (ref 0.9–3.3)

## 2008-03-27 LAB — LACTATE DEHYDROGENASE: LDH: 158 U/L (ref 94–250)

## 2008-03-27 LAB — IRON AND TIBC
%SAT: 13 % — ABNORMAL LOW (ref 20–55)
Iron: 25 ug/dL — ABNORMAL LOW (ref 42–145)
TIBC: 198 ug/dL — ABNORMAL LOW (ref 250–470)
UIBC: 173 ug/dL

## 2008-03-27 LAB — FERRITIN: Ferritin: 664 ng/mL — ABNORMAL HIGH (ref 10–291)

## 2008-03-28 LAB — SEDIMENTATION RATE: Sed Rate: 110 mm/hr — ABNORMAL HIGH (ref 0–22)

## 2008-03-30 LAB — RHEUMATOID FACTOR: Rhuematoid fact SerPl-aCnc: <20 IU/mL (ref 0–20)

## 2008-03-30 LAB — ANA: Anti Nuclear Antibody(ANA): NEGATIVE

## 2008-03-31 ENCOUNTER — Ambulatory Visit: Payer: Self-pay | Admitting: Internal Medicine

## 2008-04-02 ENCOUNTER — Encounter
Admission: RE | Admit: 2008-04-02 | Discharge: 2008-04-02 | Payer: Self-pay | Admitting: Thoracic Surgery (Cardiothoracic Vascular Surgery)

## 2008-04-02 ENCOUNTER — Ambulatory Visit: Payer: Self-pay | Admitting: Thoracic Surgery (Cardiothoracic Vascular Surgery)

## 2008-04-03 ENCOUNTER — Ambulatory Visit: Payer: Self-pay | Admitting: Physical Medicine & Rehabilitation

## 2008-04-03 ENCOUNTER — Encounter
Admission: RE | Admit: 2008-04-03 | Discharge: 2008-06-01 | Payer: Self-pay | Admitting: Physical Medicine & Rehabilitation

## 2008-04-21 LAB — CBC WITH DIFFERENTIAL/PLATELET
BASO%: 0.3 % (ref 0.0–2.0)
Basophils Absolute: 0 10*3/uL (ref 0.0–0.1)
EOS%: 1.8 % (ref 0.0–7.0)
Eosinophils Absolute: 0.1 10*3/uL (ref 0.0–0.5)
HCT: 34.3 % — ABNORMAL LOW (ref 34.8–46.6)
HGB: 11.7 g/dL (ref 11.6–15.9)
LYMPH%: 26.8 % (ref 14.0–48.0)
MCH: 29.8 pg (ref 26.0–34.0)
MCHC: 34 g/dL (ref 32.0–36.0)
MCV: 87.5 fL (ref 81.0–101.0)
MONO#: 0.6 10*3/uL (ref 0.1–0.9)
MONO%: 8.6 % (ref 0.0–13.0)
NEUT#: 4.1 10*3/uL (ref 1.5–6.5)
NEUT%: 62.5 % (ref 39.6–76.8)
Platelets: 181 10*3/uL (ref 145–400)
RBC: 3.92 10*6/uL (ref 3.70–5.32)
RDW: 15.6 % — ABNORMAL HIGH (ref 11.3–14.5)
WBC: 6.6 10*3/uL (ref 3.9–10.0)
lymph#: 1.8 10*3/uL (ref 0.9–3.3)

## 2008-04-21 LAB — COMPREHENSIVE METABOLIC PANEL
ALT: 17 U/L (ref 0–35)
AST: 30 U/L (ref 0–37)
Albumin: 4.2 g/dL (ref 3.5–5.2)
Alkaline Phosphatase: 79 U/L (ref 39–117)
BUN: 34 mg/dL — ABNORMAL HIGH (ref 6–23)
CO2: 28 mEq/L (ref 19–32)
Calcium: 9.7 mg/dL (ref 8.4–10.5)
Chloride: 100 mEq/L (ref 96–112)
Creatinine, Ser: 1.19 mg/dL (ref 0.40–1.20)
Glucose, Bld: 73 mg/dL (ref 70–99)
Potassium: 4.2 mEq/L (ref 3.5–5.3)
Sodium: 140 mEq/L (ref 135–145)
Total Bilirubin: 0.3 mg/dL (ref 0.3–1.2)
Total Protein: 7.2 g/dL (ref 6.0–8.3)

## 2008-04-21 LAB — ERYTHROCYTE SEDIMENTATION RATE: Sed Rate: 30 mm/hr (ref 0–30)

## 2008-04-21 LAB — LACTATE DEHYDROGENASE: LDH: 180 U/L (ref 94–250)

## 2008-04-28 ENCOUNTER — Ambulatory Visit: Payer: Self-pay | Admitting: Cardiovascular Disease

## 2008-04-28 ENCOUNTER — Inpatient Hospital Stay (HOSPITAL_COMMUNITY): Admission: AD | Admit: 2008-04-28 | Discharge: 2008-04-30 | Payer: Self-pay | Admitting: Cardiology

## 2008-04-30 ENCOUNTER — Ambulatory Visit: Payer: Self-pay | Admitting: Oncology

## 2008-06-01 ENCOUNTER — Ambulatory Visit: Payer: Self-pay | Admitting: Physical Medicine & Rehabilitation

## 2008-06-18 ENCOUNTER — Ambulatory Visit: Payer: Self-pay | Admitting: Oncology

## 2008-06-30 LAB — LACTATE DEHYDROGENASE: LDH: 169 U/L (ref 94–250)

## 2008-06-30 LAB — CBC WITH DIFFERENTIAL/PLATELET
BASO%: 0.5 % (ref 0.0–2.0)
Basophils Absolute: 0 10*3/uL (ref 0.0–0.1)
EOS%: 1 % (ref 0.0–7.0)
Eosinophils Absolute: 0.1 10*3/uL (ref 0.0–0.5)
HCT: 39.6 % (ref 34.8–46.6)
HGB: 13.6 g/dL (ref 11.6–15.9)
LYMPH%: 30.5 % (ref 14.0–48.0)
MCH: 30.6 pg (ref 26.0–34.0)
MCHC: 34.4 g/dL (ref 32.0–36.0)
MCV: 89.1 fL (ref 81.0–101.0)
MONO#: 0.3 10*3/uL (ref 0.1–0.9)
MONO%: 5.1 % (ref 0.0–13.0)
NEUT#: 3.7 10*3/uL (ref 1.5–6.5)
NEUT%: 62.9 % (ref 39.6–76.8)
Platelets: 185 10*3/uL (ref 145–400)
RBC: 4.45 10*6/uL (ref 3.70–5.32)
RDW: 15.2 % — ABNORMAL HIGH (ref 11.3–14.5)
WBC: 5.8 10*3/uL (ref 3.9–10.0)
lymph#: 1.8 10*3/uL (ref 0.9–3.3)

## 2008-06-30 LAB — COMPREHENSIVE METABOLIC PANEL
ALT: 16 U/L (ref 0–35)
AST: 17 U/L (ref 0–37)
Albumin: 4.5 g/dL (ref 3.5–5.2)
Alkaline Phosphatase: 74 U/L (ref 39–117)
BUN: 26 mg/dL — ABNORMAL HIGH (ref 6–23)
CO2: 28 mEq/L (ref 19–32)
Calcium: 9.7 mg/dL (ref 8.4–10.5)
Chloride: 96 mEq/L (ref 96–112)
Creatinine, Ser: 1.05 mg/dL (ref 0.40–1.20)
Glucose, Bld: 127 mg/dL — ABNORMAL HIGH (ref 70–99)
Potassium: 3.6 mEq/L (ref 3.5–5.3)
Sodium: 138 mEq/L (ref 135–145)
Total Bilirubin: 0.4 mg/dL (ref 0.3–1.2)
Total Protein: 7.6 g/dL (ref 6.0–8.3)

## 2008-06-30 LAB — ERYTHROCYTE SEDIMENTATION RATE: Sed Rate: 16 mm/hr (ref 0–30)

## 2008-07-27 ENCOUNTER — Ambulatory Visit (HOSPITAL_COMMUNITY): Admission: RE | Admit: 2008-07-27 | Discharge: 2008-07-27 | Payer: Self-pay | Admitting: Neurological Surgery

## 2008-08-04 ENCOUNTER — Encounter
Admission: RE | Admit: 2008-08-04 | Discharge: 2008-08-06 | Payer: Self-pay | Admitting: Physical Medicine & Rehabilitation

## 2008-08-06 ENCOUNTER — Ambulatory Visit: Payer: Self-pay | Admitting: Physical Medicine & Rehabilitation

## 2008-08-07 ENCOUNTER — Ambulatory Visit: Payer: Self-pay | Admitting: Oncology

## 2008-08-14 LAB — CBC WITH DIFFERENTIAL/PLATELET
BASO%: 0.4 % (ref 0.0–2.0)
Basophils Absolute: 0 10*3/uL (ref 0.0–0.1)
EOS%: 2.4 % (ref 0.0–7.0)
Eosinophils Absolute: 0.1 10*3/uL (ref 0.0–0.5)
HCT: 36.3 % (ref 34.8–46.6)
HGB: 12.6 g/dL (ref 11.6–15.9)
LYMPH%: 41.6 % (ref 14.0–48.0)
MCH: 31.4 pg (ref 26.0–34.0)
MCHC: 34.8 g/dL (ref 32.0–36.0)
MCV: 90.2 fL (ref 81.0–101.0)
MONO#: 0.4 10*3/uL (ref 0.1–0.9)
MONO%: 7.2 % (ref 0.0–13.0)
NEUT#: 2.4 10*3/uL (ref 1.5–6.5)
NEUT%: 48.4 % (ref 39.6–76.8)
Platelets: 170 10*3/uL (ref 145–400)
RBC: 4.03 10*6/uL (ref 3.70–5.32)
RDW: 13.2 % (ref 11.3–14.5)
WBC: 5 10*3/uL (ref 3.9–10.0)
lymph#: 2.1 10*3/uL (ref 0.9–3.3)

## 2008-09-25 ENCOUNTER — Ambulatory Visit: Payer: Self-pay | Admitting: Oncology

## 2008-09-29 LAB — CBC WITH DIFFERENTIAL/PLATELET
BASO%: 0.3 % (ref 0.0–2.0)
Basophils Absolute: 0 10*3/uL (ref 0.0–0.1)
EOS%: 2 % (ref 0.0–7.0)
Eosinophils Absolute: 0.1 10*3/uL (ref 0.0–0.5)
HCT: 36.8 % (ref 34.8–46.6)
HGB: 12.7 g/dL (ref 11.6–15.9)
LYMPH%: 36.3 % (ref 14.0–48.0)
MCH: 31.4 pg (ref 26.0–34.0)
MCHC: 34.5 g/dL (ref 32.0–36.0)
MCV: 90.8 fL (ref 81.0–101.0)
MONO#: 0.4 10*3/uL (ref 0.1–0.9)
MONO%: 6.3 % (ref 0.0–13.0)
NEUT#: 3.2 10*3/uL (ref 1.5–6.5)
NEUT%: 55.1 % (ref 39.6–76.8)
Platelets: 173 10*3/uL (ref 145–400)
RBC: 4.05 10*6/uL (ref 3.70–5.32)
RDW: 13.3 % (ref 11.3–14.5)
WBC: 5.8 10*3/uL (ref 3.9–10.0)
lymph#: 2.1 10*3/uL (ref 0.9–3.3)

## 2008-10-01 LAB — COMPREHENSIVE METABOLIC PANEL
ALT: 17 U/L (ref 0–35)
AST: 20 U/L (ref 0–37)
Albumin: 4.3 g/dL (ref 3.5–5.2)
Alkaline Phosphatase: 66 U/L (ref 39–117)
BUN: 22 mg/dL (ref 6–23)
CO2: 31 mEq/L (ref 19–32)
Calcium: 9 mg/dL (ref 8.4–10.5)
Chloride: 101 mEq/L (ref 96–112)
Creatinine, Ser: 0.73 mg/dL (ref 0.40–1.20)
Glucose, Bld: 87 mg/dL (ref 70–99)
Potassium: 4.4 mEq/L (ref 3.5–5.3)
Sodium: 140 mEq/L (ref 135–145)
Total Bilirubin: 0.4 mg/dL (ref 0.3–1.2)
Total Protein: 6.7 g/dL (ref 6.0–8.3)

## 2008-10-01 LAB — TRANSFERRIN RECEPTOR, SOLUABLE: Transferrin Receptor, Soluble: 15.4 nmol/L

## 2008-10-01 LAB — IRON AND TIBC
%SAT: 26 % (ref 20–55)
Iron: 71 ug/dL (ref 42–145)
TIBC: 268 ug/dL (ref 250–470)
UIBC: 197 ug/dL

## 2008-10-01 LAB — FERRITIN: Ferritin: 296 ng/mL — ABNORMAL HIGH (ref 10–291)

## 2008-10-01 LAB — LACTATE DEHYDROGENASE: LDH: 189 U/L (ref 94–250)

## 2008-10-28 ENCOUNTER — Encounter
Admission: RE | Admit: 2008-10-28 | Discharge: 2008-10-28 | Payer: Self-pay | Admitting: Physical Medicine & Rehabilitation

## 2008-10-28 ENCOUNTER — Ambulatory Visit: Payer: Self-pay | Admitting: Physical Medicine & Rehabilitation

## 2008-12-21 ENCOUNTER — Encounter
Admission: RE | Admit: 2008-12-21 | Discharge: 2009-03-21 | Payer: Self-pay | Admitting: Physical Medicine & Rehabilitation

## 2008-12-22 ENCOUNTER — Ambulatory Visit: Payer: Self-pay | Admitting: Physical Medicine & Rehabilitation

## 2008-12-24 ENCOUNTER — Ambulatory Visit: Payer: Self-pay | Admitting: Oncology

## 2009-01-05 LAB — COMPREHENSIVE METABOLIC PANEL
ALT: 29 U/L (ref 0–35)
AST: 21 U/L (ref 0–37)
Albumin: 4.1 g/dL (ref 3.5–5.2)
Alkaline Phosphatase: 98 U/L (ref 39–117)
BUN: 27 mg/dL — ABNORMAL HIGH (ref 6–23)
CO2: 28 mEq/L (ref 19–32)
Calcium: 9.4 mg/dL (ref 8.4–10.5)
Chloride: 96 mEq/L (ref 96–112)
Creatinine, Ser: 1.04 mg/dL (ref 0.40–1.20)
Glucose, Bld: 117 mg/dL — ABNORMAL HIGH (ref 70–99)
Potassium: 3.4 mEq/L — ABNORMAL LOW (ref 3.5–5.3)
Sodium: 139 mEq/L (ref 135–145)
Total Bilirubin: 0.4 mg/dL (ref 0.3–1.2)
Total Protein: 7.2 g/dL (ref 6.0–8.3)

## 2009-01-05 LAB — CBC WITH DIFFERENTIAL/PLATELET
BASO%: 0.7 % (ref 0.0–2.0)
Basophils Absolute: 0.1 10*3/uL (ref 0.0–0.1)
EOS%: 1.9 % (ref 0.0–7.0)
Eosinophils Absolute: 0.2 10*3/uL (ref 0.0–0.5)
HCT: 35 % (ref 34.8–46.6)
HGB: 12 g/dL (ref 11.6–15.9)
LYMPH%: 20.3 % (ref 14.0–49.7)
MCH: 31 pg (ref 25.1–34.0)
MCHC: 34.2 g/dL (ref 31.5–36.0)
MCV: 90.6 fL (ref 79.5–101.0)
MONO#: 0.6 10*3/uL (ref 0.1–0.9)
MONO%: 5.6 % (ref 0.0–14.0)
NEUT#: 7.3 10*3/uL — ABNORMAL HIGH (ref 1.5–6.5)
NEUT%: 71.5 % (ref 38.4–76.8)
Platelets: 228 10*3/uL (ref 145–400)
RBC: 3.87 10*6/uL (ref 3.70–5.45)
RDW: 13.3 % (ref 11.2–14.5)
WBC: 10.2 10*3/uL (ref 3.9–10.3)
lymph#: 2.1 10*3/uL (ref 0.9–3.3)

## 2009-01-05 LAB — IRON AND TIBC
%SAT: 10 % — ABNORMAL LOW (ref 20–55)
Iron: 26 ug/dL — ABNORMAL LOW (ref 42–145)
TIBC: 258 ug/dL (ref 250–470)
UIBC: 232 ug/dL

## 2009-01-05 LAB — ERYTHROCYTE SEDIMENTATION RATE: Sed Rate: 82 mm/hr — ABNORMAL HIGH (ref 0–30)

## 2009-01-05 LAB — FERRITIN: Ferritin: 372 ng/mL — ABNORMAL HIGH (ref 10–291)

## 2009-01-05 LAB — LACTATE DEHYDROGENASE: LDH: 207 U/L (ref 94–250)

## 2009-01-21 ENCOUNTER — Ambulatory Visit: Payer: Self-pay | Admitting: Physical Medicine & Rehabilitation

## 2009-02-25 ENCOUNTER — Ambulatory Visit: Payer: Self-pay | Admitting: Physical Medicine & Rehabilitation

## 2009-03-08 ENCOUNTER — Ambulatory Visit: Payer: Self-pay | Admitting: Physical Medicine & Rehabilitation

## 2009-03-12 ENCOUNTER — Encounter
Admission: RE | Admit: 2009-03-12 | Discharge: 2009-06-10 | Payer: Self-pay | Admitting: Physical Medicine & Rehabilitation

## 2009-03-16 ENCOUNTER — Ambulatory Visit (HOSPITAL_COMMUNITY)
Admission: RE | Admit: 2009-03-16 | Discharge: 2009-03-16 | Payer: Self-pay | Admitting: Physical Medicine & Rehabilitation

## 2009-03-16 ENCOUNTER — Ambulatory Visit: Payer: Self-pay | Admitting: Physical Medicine & Rehabilitation

## 2009-04-15 ENCOUNTER — Ambulatory Visit: Payer: Self-pay | Admitting: Physical Medicine & Rehabilitation

## 2009-04-15 ENCOUNTER — Ambulatory Visit: Payer: Self-pay | Admitting: Oncology

## 2009-04-19 LAB — CBC WITH DIFFERENTIAL/PLATELET
BASO%: 0.4 % (ref 0.0–2.0)
Basophils Absolute: 0 10*3/uL (ref 0.0–0.1)
EOS%: 1.6 % (ref 0.0–7.0)
Eosinophils Absolute: 0.2 10*3/uL (ref 0.0–0.5)
HCT: 36.2 % (ref 34.8–46.6)
HGB: 12.5 g/dL (ref 11.6–15.9)
LYMPH%: 34.9 % (ref 14.0–49.7)
MCH: 31.6 pg (ref 25.1–34.0)
MCHC: 34.7 g/dL (ref 31.5–36.0)
MCV: 91.3 fL (ref 79.5–101.0)
MONO#: 0.6 10*3/uL (ref 0.1–0.9)
MONO%: 6 % (ref 0.0–14.0)
NEUT#: 5.7 10*3/uL (ref 1.5–6.5)
NEUT%: 57.1 % (ref 38.4–76.8)
Platelets: 190 10*3/uL (ref 145–400)
RBC: 3.96 10*6/uL (ref 3.70–5.45)
RDW: 13.8 % (ref 11.2–14.5)
WBC: 10 10*3/uL (ref 3.9–10.3)
lymph#: 3.5 10*3/uL — ABNORMAL HIGH (ref 0.9–3.3)

## 2009-04-21 LAB — COMPREHENSIVE METABOLIC PANEL
ALT: 18 U/L (ref 0–35)
AST: 16 U/L (ref 0–37)
Albumin: 4.2 g/dL (ref 3.5–5.2)
Alkaline Phosphatase: 73 U/L (ref 39–117)
BUN: 31 mg/dL — ABNORMAL HIGH (ref 6–23)
CO2: 30 mEq/L (ref 19–32)
Calcium: 9.4 mg/dL (ref 8.4–10.5)
Chloride: 98 mEq/L (ref 96–112)
Creatinine, Ser: 1.18 mg/dL (ref 0.40–1.20)
Glucose, Bld: 98 mg/dL (ref 70–99)
Potassium: 3.5 mEq/L (ref 3.5–5.3)
Sodium: 141 mEq/L (ref 135–145)
Total Bilirubin: 0.4 mg/dL (ref 0.3–1.2)
Total Protein: 6.9 g/dL (ref 6.0–8.3)

## 2009-04-21 LAB — TRANSFERRIN RECEPTOR, SOLUABLE: Transferrin Receptor, Soluble: 19.1 nmol/L

## 2009-04-21 LAB — IRON AND TIBC
%SAT: 18 % — ABNORMAL LOW (ref 20–55)
Iron: 58 ug/dL (ref 42–145)
TIBC: 325 ug/dL (ref 250–470)
UIBC: 267 ug/dL

## 2009-04-21 LAB — LACTATE DEHYDROGENASE: LDH: 184 U/L (ref 94–250)

## 2009-04-21 LAB — FERRITIN: Ferritin: 226 ng/mL (ref 10–291)

## 2009-05-11 ENCOUNTER — Ambulatory Visit: Payer: Self-pay | Admitting: Physical Medicine & Rehabilitation

## 2009-06-04 ENCOUNTER — Encounter
Admission: RE | Admit: 2009-06-04 | Discharge: 2009-09-02 | Payer: Self-pay | Admitting: Physical Medicine & Rehabilitation

## 2009-06-09 ENCOUNTER — Ambulatory Visit: Payer: Self-pay | Admitting: Physical Medicine & Rehabilitation

## 2009-07-05 ENCOUNTER — Ambulatory Visit: Payer: Self-pay | Admitting: Physical Medicine & Rehabilitation

## 2009-08-03 ENCOUNTER — Ambulatory Visit: Payer: Self-pay | Admitting: Physical Medicine & Rehabilitation

## 2009-08-31 ENCOUNTER — Ambulatory Visit: Payer: Self-pay | Admitting: Physical Medicine & Rehabilitation

## 2009-09-27 ENCOUNTER — Encounter
Admission: RE | Admit: 2009-09-27 | Discharge: 2009-10-14 | Payer: Self-pay | Admitting: Physical Medicine & Rehabilitation

## 2009-09-28 ENCOUNTER — Ambulatory Visit: Payer: Self-pay | Admitting: Oncology

## 2009-09-28 ENCOUNTER — Ambulatory Visit: Payer: Self-pay | Admitting: Physical Medicine & Rehabilitation

## 2009-09-29 LAB — IRON AND TIBC
%SAT: 19 % — ABNORMAL LOW (ref 20–55)
Iron: 70 ug/dL (ref 42–145)
TIBC: 361 ug/dL (ref 250–470)
UIBC: 291 ug/dL

## 2009-09-29 LAB — FERRITIN: Ferritin: 244 ng/mL (ref 10–291)

## 2009-09-29 LAB — TRANSFERRIN RECEPTOR, SOLUABLE: Transferrin Receptor, Soluble: 26.7 nmol/L

## 2009-10-26 ENCOUNTER — Encounter
Admission: RE | Admit: 2009-10-26 | Discharge: 2010-01-24 | Payer: Self-pay | Admitting: Physical Medicine & Rehabilitation

## 2009-10-27 ENCOUNTER — Ambulatory Visit: Payer: Self-pay | Admitting: Physical Medicine & Rehabilitation

## 2009-11-23 ENCOUNTER — Ambulatory Visit: Payer: Self-pay | Admitting: Physical Medicine & Rehabilitation

## 2009-11-26 ENCOUNTER — Encounter (INDEPENDENT_AMBULATORY_CARE_PROVIDER_SITE_OTHER): Payer: Self-pay | Admitting: *Deleted

## 2009-12-22 ENCOUNTER — Ambulatory Visit: Payer: Self-pay | Admitting: Physical Medicine & Rehabilitation

## 2009-12-27 ENCOUNTER — Ambulatory Visit: Payer: Self-pay | Admitting: Internal Medicine

## 2009-12-27 DIAGNOSIS — R197 Diarrhea, unspecified: Secondary | ICD-10-CM | POA: Insufficient documentation

## 2010-01-04 ENCOUNTER — Ambulatory Visit: Payer: Self-pay | Admitting: Oncology

## 2010-01-13 ENCOUNTER — Ambulatory Visit (HOSPITAL_COMMUNITY): Admission: RE | Admit: 2010-01-13 | Discharge: 2010-01-13 | Payer: Self-pay | Admitting: Internal Medicine

## 2010-01-13 ENCOUNTER — Ambulatory Visit: Payer: Self-pay | Admitting: Internal Medicine

## 2010-01-18 ENCOUNTER — Ambulatory Visit: Payer: Self-pay | Admitting: Physical Medicine & Rehabilitation

## 2010-02-01 ENCOUNTER — Telehealth: Payer: Self-pay | Admitting: Internal Medicine

## 2010-02-03 ENCOUNTER — Ambulatory Visit: Payer: Self-pay | Admitting: Oncology

## 2010-02-03 LAB — CBC WITH DIFFERENTIAL/PLATELET
BASO%: 0.4 % (ref 0.0–2.0)
Basophils Absolute: 0 10*3/uL (ref 0.0–0.1)
EOS%: 0.7 % (ref 0.0–7.0)
Eosinophils Absolute: 0 10*3/uL (ref 0.0–0.5)
HCT: 34.9 % (ref 34.8–46.6)
HGB: 11.9 g/dL (ref 11.6–15.9)
LYMPH%: 27.6 % (ref 14.0–49.7)
MCH: 31.2 pg (ref 25.1–34.0)
MCHC: 34 g/dL (ref 31.5–36.0)
MCV: 91.6 fL (ref 79.5–101.0)
MONO#: 0.4 10*3/uL (ref 0.1–0.9)
MONO%: 5.5 % (ref 0.0–14.0)
NEUT#: 4.5 10*3/uL (ref 1.5–6.5)
NEUT%: 65.8 % (ref 38.4–76.8)
Platelets: 237 10*3/uL (ref 145–400)
RBC: 3.81 10*6/uL (ref 3.70–5.45)
RDW: 13.5 % (ref 11.2–14.5)
WBC: 6.8 10*3/uL (ref 3.9–10.3)
lymph#: 1.9 10*3/uL (ref 0.9–3.3)

## 2010-02-04 LAB — C-REACTIVE PROTEIN: CRP: 6.4 mg/dL — ABNORMAL HIGH (ref <0.6)

## 2010-02-05 LAB — LACTATE DEHYDROGENASE: LDH: 219 U/L (ref 94–250)

## 2010-02-05 LAB — COMPREHENSIVE METABOLIC PANEL
ALT: 14 U/L (ref 0–35)
AST: 16 U/L (ref 0–37)
Albumin: 4.1 g/dL (ref 3.5–5.2)
Alkaline Phosphatase: 76 U/L (ref 39–117)
BUN: 37 mg/dL — ABNORMAL HIGH (ref 6–23)
CO2: 24 mEq/L (ref 19–32)
Calcium: 9.6 mg/dL (ref 8.4–10.5)
Chloride: 100 mEq/L (ref 96–112)
Creatinine, Ser: 1.6 mg/dL — ABNORMAL HIGH (ref 0.40–1.20)
Glucose, Bld: 103 mg/dL — ABNORMAL HIGH (ref 70–99)
Potassium: 4 mEq/L (ref 3.5–5.3)
Sodium: 142 mEq/L (ref 135–145)
Total Bilirubin: 0.4 mg/dL (ref 0.3–1.2)
Total Protein: 7.1 g/dL (ref 6.0–8.3)

## 2010-02-05 LAB — IRON AND TIBC
%SAT: 20 % (ref 20–55)
Iron: 61 ug/dL (ref 42–145)
TIBC: 299 ug/dL (ref 250–470)
UIBC: 238 ug/dL

## 2010-02-05 LAB — TRANSFERRIN RECEPTOR, SOLUABLE: Transferrin Receptor, Soluble: 22.1 nmol/L

## 2010-02-05 LAB — SEDIMENTATION RATE: Sed Rate: 102 mm/hr — ABNORMAL HIGH (ref 0–22)

## 2010-02-05 LAB — FERRITIN: Ferritin: 276 ng/mL (ref 10–291)

## 2010-02-11 ENCOUNTER — Encounter: Admission: RE | Admit: 2010-02-11 | Discharge: 2010-02-11 | Payer: Self-pay | Admitting: Emergency Medicine

## 2010-03-07 ENCOUNTER — Ambulatory Visit: Payer: Self-pay | Admitting: Oncology

## 2010-03-07 LAB — SEDIMENTATION RATE: Sed Rate: 15 mm/hr (ref 0–22)

## 2010-03-07 LAB — COMPREHENSIVE METABOLIC PANEL
ALT: 19 U/L (ref 0–35)
AST: 15 U/L (ref 0–37)
Albumin: 4.6 g/dL (ref 3.5–5.2)
Alkaline Phosphatase: 56 U/L (ref 39–117)
BUN: 39 mg/dL — ABNORMAL HIGH (ref 6–23)
CO2: 24 mEq/L (ref 19–32)
Calcium: 9.7 mg/dL (ref 8.4–10.5)
Chloride: 102 mEq/L (ref 96–112)
Creatinine, Ser: 1.75 mg/dL — ABNORMAL HIGH (ref 0.40–1.20)
Glucose, Bld: 118 mg/dL — ABNORMAL HIGH (ref 70–99)
Potassium: 4 mEq/L (ref 3.5–5.3)
Sodium: 138 mEq/L (ref 135–145)
Total Bilirubin: 0.6 mg/dL (ref 0.3–1.2)
Total Protein: 7.3 g/dL (ref 6.0–8.3)

## 2010-03-07 LAB — CBC WITH DIFFERENTIAL/PLATELET
BASO%: 0.4 % (ref 0.0–2.0)
Basophils Absolute: 0 10*3/uL (ref 0.0–0.1)
EOS%: 0.3 % (ref 0.0–7.0)
Eosinophils Absolute: 0 10*3/uL (ref 0.0–0.5)
HCT: 36.6 % (ref 34.8–46.6)
HGB: 12.5 g/dL (ref 11.6–15.9)
LYMPH%: 21.5 % (ref 14.0–49.7)
MCH: 31.4 pg (ref 25.1–34.0)
MCHC: 34.2 g/dL (ref 31.5–36.0)
MCV: 91.8 fL (ref 79.5–101.0)
MONO#: 0.5 10*3/uL (ref 0.1–0.9)
MONO%: 5.2 % (ref 0.0–14.0)
NEUT#: 6.5 10*3/uL (ref 1.5–6.5)
NEUT%: 72.6 % (ref 38.4–76.8)
Platelets: 183 10*3/uL (ref 145–400)
RBC: 3.99 10*6/uL (ref 3.70–5.45)
RDW: 14.7 % — ABNORMAL HIGH (ref 11.2–14.5)
WBC: 8.9 10*3/uL (ref 3.9–10.3)
lymph#: 1.9 10*3/uL (ref 0.9–3.3)

## 2010-03-07 LAB — LACTATE DEHYDROGENASE: LDH: 207 U/L (ref 94–250)

## 2010-07-27 ENCOUNTER — Ambulatory Visit (HOSPITAL_COMMUNITY): Admission: RE | Admit: 2010-07-27 | Discharge: 2010-07-27 | Payer: Self-pay | Admitting: Urology

## 2010-09-23 ENCOUNTER — Ambulatory Visit: Payer: Self-pay | Admitting: Oncology

## 2010-10-13 ENCOUNTER — Ambulatory Visit (HOSPITAL_COMMUNITY)
Admission: RE | Admit: 2010-10-13 | Discharge: 2010-10-14 | Payer: Self-pay | Source: Home / Self Care | Attending: Orthopedic Surgery | Admitting: Orthopedic Surgery

## 2010-11-12 ENCOUNTER — Encounter: Payer: Self-pay | Admitting: Obstetrics and Gynecology

## 2010-11-14 ENCOUNTER — Encounter (HOSPITAL_COMMUNITY): Payer: Self-pay | Admitting: Oncology

## 2010-11-22 NOTE — Consult Note (Signed)
Summary: [facility]  [facility]   Imported By: Lester Audrain 01/20/2010 08:33:04  _____________________________________________________________________  External Attachment:    Type:   Image     Comment:   External Document

## 2010-11-22 NOTE — Procedures (Signed)
Summary: Upper Edoscopy  Upper Edoscopy   Imported By: Stephannie Li 04/09/2008 09:16:46  _____________________________________________________________________  External Attachment:    Type:   Image     Comment:   External Document

## 2010-11-22 NOTE — Letter (Signed)
Summary: [facility] Instructions  [facility]  [address] [place], [place] [address]   Phone: [phone]  Fax: [phone]       BRIYAH WHEELWRIGHT    1950/10/30    MRN: [id number]        Procedure Day /Date:THURSDAY, 01/13/10     Arrival Time:11:00 AM     Procedure Time:1:15 PM    Location of Procedure:                     X  [facility] ( Outpatient Registration)                        PREPARATION FOR COLONOSCOPY WITH MOVIPREP WITH PROPOFUL   Starting 5 days prior to your procedure 3/20/11do not eat nuts, seeds, popcorn, corn, beans, peas,  salads, or any raw vegetables.  Do not take any fiber supplements (e.g. Metamucil, Citrucel, and Benefiber).  THE DAY BEFORE YOUR PROCEDURE         DATE: 01/12/10 DAY: WEDNESDAY  1.  Drink clear liquids the entire day-NO SOLID FOOD  2.  Do not drink anything colored red or purple.  Avoid juices with pulp.  No orange juice.  3.  Drink at least 64 oz. (8 glasses) of fluid/clear liquids during the day to prevent dehydration and help the prep work efficiently.  CLEAR LIQUIDS INCLUDE: Water Jello Ice Popsicles Tea (sugar ok, no milk/cream) Powdered fruit flavored drinks Coffee (sugar ok, no milk/cream) Gatorade Juice: apple, white grape, white cranberry  Lemonade Clear bullion, consomm, broth Carbonated beverages (any kind) Strained chicken noodle soup Hard Candy                             4.  In the morning, mix first dose of MoviPrep solution:    Empty 1 Pouch A and 1 Pouch B into the disposable container    Add lukewarm drinking water to the top line of the container. Mix to dissolve    Refrigerate (mixed solution should be used within 24 hrs)  5.  Begin drinking the prep at 5:00 p.m. The MoviPrep container is divided by 4 marks.   Every 15 minutes drink the solution down to the next mark (approximately 8 oz) until the full liter is complete.   6.  Follow completed prep with 16 oz of clear liquid of  your choice (Nothing red or purple).  Continue to drink clear liquids until bedtime.  7.  Before going to bed, mix second dose of MoviPrep solution:    Empty 1 Pouch A and 1 Pouch B into the disposable container    Add lukewarm drinking water to the top line of the container. Mix to dissolve    Refrigerate  THE DAY OF YOUR PROCEDURE      DATE: 01/13/10 DAY: THURSDAY  Beginning at 8:00 a.m. (5 hours before procedure):         1. Every 15 minutes, drink the solution down to the next mark (approx 8 oz) until the full liter is complete.  2. Follow completed prep with 16 oz. of clear liquid of your choice.    3. NOTHING TO EAT OR DRINK AFTER MIDNIGHT   MEDICATION INSTRUCTIONS  Unless otherwise instructed, you should take regular prescription medications with a small sip of water   as early as possible the morning of your procedure.  : _  OTHER INSTRUCTIONS  You will need a responsible adult at least 62 years of age to accompany you and drive you home.   This person must remain in the waiting room during your procedure.  Wear loose fitting clothing that is easily removed.  Leave jewelry and other valuables at home.  However, you may wish to bring a book to read or  an iPod/MP3 player to listen to music as you wait for your procedure to start.  Remove all body piercing jewelry and leave at home.  Total time from sign-in until discharge is approximately 2-3 hours.  You should go home directly after your procedure and rest.  You can resume normal activities the  day after your procedure.  The day of your procedure you should not:   Drive   Make legal decisions   Operate machinery   Drink alcohol   Return to work  You will receive specific instructions about eating, activities and medications before you leave.    The above instructions have been reviewed and explained to me by   _______________________    I fully understand and can verbalize these  instructions _____________________________ Date _________

## 2010-11-22 NOTE — Procedures (Signed)
Summary: [facility]  [facility]   Imported By: Lester  01/20/2010 08:28:25  _____________________________________________________________________  External Attachment:    Type:   Image     Comment:   External Document

## 2010-11-22 NOTE — Letter (Signed)
Summary: New Patient letter  [facility]  [address] [place], [place] [address]   Phone: [phone]  Fax: [phone]       11/26/2009 MRN: [id number]  [name] [address] [place], [place]  [address]  Dear Ms. Gayla Doss,  Welcome to the Gastroenterology Division at [facility].    You are scheduled to see Dr. Marina Goodell on 12/27/2009 at 9:15AM on the 3rd floor at [facility], [address]  We ask that you try to arrive at our office 15 minutes prior to your appointment time to allow for check-in.  We would like you to complete the enclosed self-administered evaluation form prior to your visit and bring it with you on the day of your appointment.  We will review it with you.  Also, please bring a complete list of all your medications or, if you prefer, bring the medication bottles and we will list them.  Please bring your insurance card so that we may make a copy of it.  If your insurance requires a referral to see a specialist, please bring your referral form from your primary care physician.  Co-payments are due at the time of your visit and may be paid by cash, check or credit card.     Your office visit will consist of a consult with your physician (includes a physical exam), any laboratory testing he/she may order, scheduling of any necessary diagnostic testing (e.g. x-ray, ultrasound, CT-scan), and scheduling of a procedure (e.g. Endoscopy, Colonoscopy) if required.  Please allow enough time on your schedule to allow for any/all of these possibilities.    If you cannot keep your appointment, please call [phone] to cancel or reschedule prior to your appointment date.  This allows [place] the opportunity to schedule an appointment for another patient in need of care.  If you do not cancel or reschedule by 5 p.m. the business day prior to your appointment date, you will be charged a $50.00 late cancellation/no-show fee.    Thank you for choosing  [facility] for your medical needs.  We appreciate the opportunity to care for you.  Please visit [place] at our website  to learn more about our practice.                     Sincerely,                                                             The Gastroenterology Division

## 2010-11-22 NOTE — Consult Note (Signed)
Summary: [facility]  [facility]   Imported By: Lester  01/20/2010 08:34:49  _____________________________________________________________________  External Attachment:    Type:   Image     Comment:   External Document

## 2010-11-22 NOTE — Progress Notes (Signed)
Summary: wants col sheet  Phone Note Call from Patient Call back at Home Phone [phone]   Caller: Patient Call For: Marina Goodell Reason for Call: Talk to Nurse Summary of Call: Patient would like discharge sheet from colon done 3-24, states that she was not given anything and usually she gets a copy of the pictures. Initial call taken by: Tawni Levy,  February 01, 2010 1:48 PM  Follow-up for Phone Call        Pt. had done at [facility].Husband will give her message to call back. Follow-up by: Teryl Lucy RN,  February 01, 2010 2:37 PM  Additional Follow-up for Phone Call Additional follow up Details #1::        sending colonoscopy report with color pictures to Teryl Lucy for distribution to patient. Additional Follow-up by: Quincy Carnes RN,  February 01, 2010 3:17 PM

## 2010-11-22 NOTE — Procedures (Signed)
Summary: [facility]  [facility]   Imported By: Lester [place] 01/20/2010 08:31:20  _____________________________________________________________________  External Attachment:    Type:   Image     Comment:   External Document

## 2010-11-22 NOTE — Procedures (Signed)
Summary: Preparation for Colonoscopy / [facility]  Preparation for Colonoscopy / [facility]   Imported By: Lennie Odor 12/30/2009 15:12:41  _____________________________________________________________________  External Attachment:    Type:   Image     Comment:   External Document

## 2010-11-22 NOTE — Procedures (Signed)
Summary: Colonoscopy  Patient: [name] Note: All result statuses are Final unless otherwise noted.  Tests: (1) Colonoscopy (COL)   COL Colonoscopy           DONE     [facility]     [address] [place], [place]  [address]           COLONOSCOPY PROCEDURE REPORT           PATIENT:  [name], [name]  MR#:  [id number]     BIRTHDATE:  1951-06-23, 58 yrs. old  GENDER:  female     ENDOSCOPIST:  Wilhemina Bonito. Eda Keys, MD     REF. BY:  Lesle Chris, M.D.     PROCEDURE DATE:  01/13/2010     PROCEDURE:  Average-risk screening colonoscopy     G0121     ASA CLASS:  Class II     INDICATIONS:  colorectal cancer screening, average risk ; prior     colonoscopy 1996 and 2000 negative     MEDICATIONS:   MAC sedation, administered by CRNA           DESCRIPTION OF PROCEDURE:   After the risks benefits and     alternatives of the procedure were thoroughly explained, informed     consent was obtained.  Digital rectal exam was performed and     revealed no abnormalities.   The EC-[id number] ([id number]) endoscope was     introduced through the anus and advanced to the cecum, which was     identified by both the appendix and ileocecal valve, without     limitations.  The quality of the prep was good, using MoviPrep.     The instrument was then slowly withdrawn (time = ) as the     colon was fully examined.     <<PROCEDUREIMAGES>>           FINDINGS:  Mild diverticulosis was found in the sigmoid colon.     This was otherwise a normal examination of the colon.  No polyps or     cancers were seen.   Retroflexed views in the rectum revealed no     abnormalities.    The scope was then withdrawn from the patient     and the procedure completed.           COMPLICATIONS:  None     ENDOSCOPIC IMPRESSION:     1) Mild diverticulosis in the sigmoid colon     2) Otherwise normal examination     3) No polyps or cancers     RECOMMENDATIONS:     1) Continue current colorectal screening recommendations  for     "routine risk" patients with a repeat colonoscopy in 10 years.           ______________________________     Wilhemina Bonito. Eda Keys, MD           CC:  Lesle Chris, MD; The Patient           n.     eSIGNED:   Wilhemina Bonito. Eda Keys at 01/13/2010 02:37 PM           Gayla Doss, [place], [phone]  Note: An exclamation mark (!) indicates a result that was not dispersed into the flowsheet. Document Creation Date: 01/13/2010 2:38 PM _______________________________________________________________________  (1) Order result status: Final Collection or observation date-time: 01/13/2010 14:13 Requested date-time:  Receipt date-time:  Reported date-time:  Referring Physician:   Ordering  Physician: Fransico Setters [phone]) Specimen Source:  Source: Launa Grill Order Number: [id number] Lab site:   Appended Document: Colonoscopy    Clinical Lists Changes  Observations: Added new observation of COLONOSCOPY: 12/22/2019 (01/13/2010 14:40)      Appended Document: Colonoscopy Colon report mailed to pt.

## 2010-11-22 NOTE — Assessment & Plan Note (Signed)
Summary: diarrhea...as.   History of Present Illness Visit Type: Initial Visit Primary GI MD: Yancey Flemings MD Primary Provider: Lesle Chris, MD Chief Complaint: Patient has no GI complaints now when she made the appt she had some diarrhea which has resolved.  History of Present Illness:   60 year old female with multiple medical problems including hypothyroidism, congestive heart failure, morbid obesity, fibromyalgia, bilateral "kidney cancer", precancerous uterine/ovarian condition with subsequent resection, and melanoma. She presents today regarding diarrhea and screening colonoscopy. Patient reports developing diarrhea approximately one month ago. There was no associated nausea or vomiting. Mild abdominal cramping. No bleeding. The problem was exacerbated with meals. Somewhat improved with antidiarrheals. The condition continued for about 10 days then resolved. Currently she describes normal bowel movements. Patient has undergone colonoscopy on 2 prior occasions. Examination in 1996 revealed a diminutive hyperplastic polyp and mild diverticulosis. Followup in 2000 was normal except for mild diverticulosis. She did undergo upper endoscopy in 2001 regarding iron deficiency anemia. The exam was normal including normal small bowel biopsies. She has not been seen since. She takes chronic narcotics, benzodiazepines, and antidepressants for chronic pain syndromes.   GI Review of Systems      Denies abdominal pain, acid reflux, belching, bloating, chest pain, dysphagia with liquids, dysphagia with solids, heartburn, loss of appetite, nausea, vomiting, vomiting blood, weight loss, and  weight gain.      Reports diarrhea.     Denies anal fissure, black tarry stools, change in bowel habit, constipation, diverticulosis, fecal incontinence, heme positive stool, hemorrhoids, irritable bowel syndrome, jaundice, light color stool, liver problems, rectal bleeding, and  rectal pain. Preventive Screening-Counseling  & Management  Alcohol-Tobacco     Smoking Status: quit      Drug Use:  no.      Current Medications (verified): 1)  Ambien 10 Mg Tabs (Zolpidem Tartrate) .... Take One By Mouth At Bedtime 2)  Multivitamins  Tabs (Multiple Vitamin) .... Take One By Mouth Once Daily 3)  Celexa 40 Mg Tabs (Citalopram Hydrobromide) .... Take One By Mouth Once Daily 4)  Synthroid 100 Mcg Tabs (Levothyroxine Sodium) .... Take One By Mouth Once Daily 5)  Morphine Sulfate Cr 30 Mg Xr12h-Tab (Morphine Sulfate) .... Take One By Mouth Three Times A Day 6)  Morphine Sulfate 15 Mg Tabs (Morphine Sulfate) .... Take One By Mouth Four Times A Day 7)  Wellbutrin Xl 150 Mg Xr24h-Tab (Bupropion Hcl) .... Take One By Mouth Once Daily 8)  Valium 5 Mg Tabs (Diazepam) .... Take One By Mouth Two Times A Day 9)  Triamterene-Hctz 75-50 Mg Tabs (Triamterene-Hctz) .... Take One By Mouth Once Daily  Allergies (verified): 1)  ! Ampicillin  Past History:  Past Medical History: GERD Anemia Iron Deficiency Diverticulosis Adenomatous Colon Polyps Hypothyroidism Kidney Cancer Congestive Heart Failure / pericarditis Depression Obesity Fibromyalgia malignant melanoma  Past Surgical History: Hysterectomy Lt. Oophorectomy Hip Surgery (as a child) Lumbar Laminectomy  Tubal Ligation Appendectomy Tonsillectomy back surgery right shoulder surgeryright knee replacement Pericardial window  Family History: Family History of Stomach Cancer: mother Fmaily History of Lung Cancer: Father (smoker) No FH of Colon Cancer: Family History of Diabetes: maternal aunt  Social History: Occupation: dsablied Patient is a former smoker. quit years ago Alcohol Use - no Daily Caffeine Use 3 per day Illicit Drug Use - no Smoking Status:  quit Drug Use:  no  Review of Systems       The patient complains of allergy/sinus, anemia, anxiety-new, arthritis/joint pain, depression-new, and shortness of breath.  The patient denies back  pain, blood in urine, breast changes/lumps, change in vision, confusion, cough, coughing up blood, fainting, fatigue, fever, headaches-new, hearing problems, heart murmur, heart rhythm changes, itching, menstrual pain, muscle pains/cramps, night sweats, nosebleeds, pregnancy symptoms, skin rash, sleeping problems, sore throat, swelling of feet/legs, swollen lymph glands, thirst - excessive , urination - excessive , urination changes/pain, urine leakage, vision changes, and voice change.    Vital Signs:  Patient profile:   60 year old female Height:      66.5 inches Weight:      209.4 pounds BMI:     33.41 Pulse rate:   76 / minute Pulse rhythm:   regular BP sitting:   116 / 70  (left arm) Cuff size:   regular  Vitals Entered By: Harlow Mares CMA Duncan Dull) (December 27, 2009 9:28 AM)  Physical Exam  General:  Well developed, obese,well nourished, no acute distress. Head:  Normocephalic and atraumatic. Eyes:  PERRLA, no icterus. Ears:  Normal auditory acuity. Nose:  No deformity, discharge,  or lesions. Mouth:  No deformity or lesions,  Neck:  Supple; no masses or thyromegaly. Lungs:  Clear throughout to auscultation. Heart:  Regular rate and rhythm; no murmurs, rubs,  or bruits. Abdomen:  Soft, nontender and nondistended. No masses, hepatosplenomegaly or hernias noted. Normal bowel sounds.. Prior surgical incisions well-healed Rectal:  deferred until colonoscopy Msk:  Symmetrical with no gross deformities. Normal posture. Pulses:  Normal pulses noted. Extremities:  No clubbing, cyanosis, edema or deformities noted. Neurologic:  Alert and  oriented x4;  grossly normal neurologically. Skin:  Intact without significant lesions or rashes. Cervical Nodes:  No significant cervical adenopathy.no supraclavicular adenopathy Psych:  Alert and cooperative. Normal mood and affect.   Impression & Recommendations:  Problem # 1:  DIARRHEA-PRESUMED INFECTIOUS (ICD-009.3) recent 10 day illness  manifested diarrhea. Noninflammatory. Suspect viral gastroenteritis. Now resolved.  Plan: #1 expectant management  Problem # 2:  SCREENING COLORECTAL-CANCER (ICD-V76.51) appropriate candidate for followup colon cancer screening. Last examination in 2000. No active lower GI symptoms (save diarrhea see above). She is an appropriate candidate without contraindication. I think that she would be difficult to sedate or high risk for sedation given her medications and large body habitus. As she is HIGH RISK, we will schedule the examination at the hospital with MAC and propofol. I discussed this with her. She is agreeable. We also discussed the nature of colonoscopy as well as its risks, benefits, and alternatives. She understood and agreed to proceed. Additionally, Movi prep prescribed. The patient instructed on its use  Other Orders: ZCOL (ZCOL)  Patient Instructions: 1)  Colonoscopy with Propoful scheduled at [facility] 01/13/10 1:15 pm 2)  Colonoscopy and Flexible Sigmoidoscopy brochure given.  3)  Movi prep instructions given to patient and Movi prep Rx. sent to pharmacy for patient to pick up. 4)  Nothing to eat or drink after midnight night before procedure. 5)  The medication list was reviewed and reconciled.  All changed / newly prescribed medications were explained.  A complete medication list was provided to the patient / caregiver. 6)  Copy: Dr. Lesle Chris Prescriptions: MOVIPREP 100 GM  SOLR (PEG-KCL-NACL-NASULF-NA ASC-C) As per prep instructions.  #1 x 0   Entered by:   Milford Cage NCMA   Authorized by:   Hilarie Fredrickson MD   Signed by:   Milford Cage NCMA on 12/27/2009   Method used:   Electronically to        [facility] (  retail)       [address]       [place], [place]  [address]       Ph: [phone]       Fax: [phone]   RxID:   [id number]

## 2010-11-22 NOTE — Progress Notes (Signed)
Summary: EGD  [facility]   Imported By: Stephannie Li 04/09/2008 09:19:21  _____________________________________________________________________  External Attachment:    Type:   Image     Comment:   External Document

## 2010-11-22 NOTE — Discharge Summary (Signed)
Summary: Discharge Summary   NAME:  [name], [name]                 ACCOUNT NO.:  [id number]      MEDICAL RECORD NO.:  [id number]          PATIENT TYPE:  INP      LOCATION:  4730                         FACILITY:  [facility]      PHYSICIAN:  Zenaida Deed. Mayford Knife, M.D.DATE OF BIRTH:  February 08, 1951      DATE OF ADMISSION:  03/23/2008   DATE OF DISCHARGE:  03/24/2008                                  DISCHARGE SUMMARY      REASON FOR HOSPITALIZATION:  Chest pain.      DISCHARGE DIAGNOSIS:  Chest pain, ruled out for myocardial infarction.      OTHER DISCHARGE DIAGNOSES:   1. Multiple primary cancers including renal cell carcinoma, status       post left and right partial nephrectomies; ovarian cancer, status       post resection; and melanoma.   2. 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   3. Recent admission for pericardial effusion with window performed by       cardiovascular thoracic surgery with negative fluid studies.   4. Iron deficiency anemia, status post IV iron in May 2009.   5. Hyperlipidemia.   6. Status post lumbar decompression, March 2003.   7. Status post hemilaminectomy and partial facetectomy on February 08, 1989.   8. Status post right total knee replacement.   9. Status post total abdominal hysterectomy with bilateral salpingo-       oophorectomy status post ovarian cancer.      DISCHARGE MEDICATIONS:   1. Protonix 40 mg twice daily, this is a new medication.   2. Aspirin 650 mg 3 times daily, this is increased after previous dose       of aspirin.   3. Celexa 40 mg p.o. daily.   4. Synthroid 100 mcg p.o. daily.   5. Amitriptyline 75 mg nightly.   6. Ambien 10 mg nightly.   7. MS Contin 30 mg 4 times daily as needed.   8. Soma 350 mg as needed 3 times daily.   9. Flexeril 70 mg at night.   10.Xanax 0.5 mg 4 times daily as needed.   11.Vicodin as  needed.   12.Dilaudid 4 mg every 4 hours as needed.   13.Premarin 0.65 mg daily.      DISCHARGE INSTRUCTIONS:   1. The patient is to take medications as mentioned previously.   2. The patient is to follow up with Dr. Cleta Alberts at [facility] on       [name] 9, 2009, at 3 p.m.  The patient is to follow up with       cardiologist Dr. Clarene Duke.  Cardiologist office will call the patient       with an appointment time.  Dr. Cleta Alberts to follow up for establishment       of cardiology followup.  SIGNIFICANT FINDINGS.:  Admission workup.  Chest x-ray on admission [name]   1, 2009, showed no acute cardiopulmonary findings with mild chronic   interstitial lung changes.  Admission EKG showed diffuse T-wave changes   in all leads.  Point-of-care cardiac markers on admission were negative.   INR on admission was 1.2.  PT was 15.0, PTT was 29.  BNP was 67 on   admission.  CMP on admission had a low potassium of 2.6, slightly low   sodium at 134.  Liver function tests were within normal limits except   for a slightly low albumin at 3.3, and lipase on admission was less than   10.      Further inpatient workup, CBC with differential was performed.  On   admission; white blood cell count was 11.2, hemoglobin was 11.2, and   platelet count was 440.  Basic metabolic panel was performed on the   morning following after admission showed responsive potassium to   potassium repletion.  On admission, potassium was 3.6 on this panel.   Other values were within normal limits.  Cardiac enzymes were cycled for   2 more sets and were negative for both sets.  Cardiac echo on [name] 2,   2009, [name] noted to be 50-60%, and no left ventricular wall   motion abnormalities.  Left ventricular wall thickness was again at the   upper limits of normal.  The feature was consistent with mild diastolic   dysfunction.  Aortic valve thickness was noted to be mildly increased.   There was mild mitral annular  calcification and mild mitral valvular   regurgitation.  Estimated peak pulmonary artery systolic pressure was 29   mmHg, and there was a small pericardial effusion anterior to the heart   and a trivial apical effusion.      BRIEF HOSPITAL COURSE:  The patient is a 60 year old female with a   history of multiple primary cancers, who presents to the emergency   department with chest pain.  Of note, the patient was recently   discharged from the hospital on Mar 09, 2008, after being diagnosed with   a pericardial effusion.  On this admission, pericardial window was   performed by cardiovascular thoracic surgery, and fluid studies were   sent.  Pericardial fluid studies showed atypical cells present favoring   atypical mesothelial cells, and biopsy of the pericardium at that time   showed mild inflammation.  The patient was admitted for rule out of   myocardial infarction.  Cardiac enzymes were negative x3 sets.  EKG   changes were noted as evidenced by T-wave changes in all leads.   Therefore, echocardiogram was ordered to evaluate for changes from   previous.  Of note, echo on Mar 21, 2008, showed an EF of 60-65%.  No   left ventricular wall motion abnormalities with a left ventricular wall   thickness that was the upper limits of normal.  Left atrium was mildly   dilated, and there was a small-to-possibly moderate free-flowing   circumferential pericardial effusion.  No evidence of tamponade.   Echocardiogram performed on this admission showed small pericardial   effusion anterior to the heart and trivial apical effusion.  The patient   was placed on high-dose aspirin 3 times daily.  For treatment of this   effusion, she received Toradol x1 dose in the emergency department.   Consideration was given to prednisone and colchicine.  Colchicine was   deferred given the patient's  history of partial nephrectomies   bilaterally.  Prednisone was deferred and could be considered as an    outpatient if the patient continues to have symptoms.  Aspirin was   chosen as this is a cardiac preventative medication for myocardial   infarction and is also on anti-inflammatory effective for resolving   pericardial effusion.  By hospital day #2, the patient's chest pain had   improved significantly, and the patient has effectively been ruled out   for myocardial infarction.  She was set for follow up with Dr. Ellis Parents   office on Nash[place] 9, 2009, at 3:00 p.m., and family practice teaching   service attempted to set the patient up with follow up with Dr. Clarene Duke   of cardiology.  Dr. Fredirick Maudlin office stated that they would have to call   the patient with an appointment.  Dr. Cleta Alberts, her primary care provider,   can follow this up at the next office visit.  Vital signs remained   stable on this admission, and other medical issues were stable as well.   The patient was placed on her chronic pain regimen with the exception of   making her scheduled or we would make the medications p.r.n.  The   patient reported good pain control with this regimen.      PROCEDURES:  A 2-D echocardiogram.      CONSULTATIONS:  None.      DISPOSITION:  The patient discharged to home with close followup.      DISCHARGE CONDITION:  Stable.      ISSUES FOR FOLLOWUP:  The patient should have cardiology followup.  We   have attempted to make appointment with Dr. Clarene Duke.  We would recommend   that Dr. Cleta Alberts followup whether or not the patient has been able to be   establish with Dr. Clarene Duke.  Symptoms should be monitored for possible   recurrence of effusion or evidence of pericarditis.  It still remains   possible that the patient's pericarditis and pericardial effusion from   both previous admission and this admission as a result of an occult   malignancy, especially given the patient's strong history of multiple   primary cancers, we would recommend that the patient have oncologic   followup for further monitoring of  this.  We would defer this patient to   the patient's primary care provider Dr. Cleta Alberts at [facility].               Myrtie Soman, MD   Electronically Signed               Zenaida Deed. Mayford Knife, M.D.   Electronically Signed         TE/MEDQ  D:  03/24/2008  T:  03/25/2008  Job:  [id number]      cc:   Thereasa Solo. Little, M.D.   [place] Cleta Alberts, M.D.

## 2011-01-02 LAB — PROTIME-INR
INR: 0.99 (ref 0.00–1.49)
Prothrombin Time: 13.3 seconds (ref 11.6–15.2)

## 2011-01-02 LAB — COMPREHENSIVE METABOLIC PANEL
ALT: 18 U/L (ref 0–35)
AST: 25 U/L (ref 0–37)
Albumin: 4.3 g/dL (ref 3.5–5.2)
Alkaline Phosphatase: 76 U/L (ref 39–117)
BUN: 21 mg/dL (ref 6–23)
CO2: 30 mEq/L (ref 19–32)
Calcium: 9.7 mg/dL (ref 8.4–10.5)
Chloride: 98 mEq/L (ref 96–112)
Creatinine, Ser: 1.19 mg/dL (ref 0.4–1.2)
GFR calc Af Amer: 56 mL/min — ABNORMAL LOW (ref >60)
GFR calc non Af Amer: 46 mL/min — ABNORMAL LOW (ref >60)
Glucose, Bld: 100 mg/dL — ABNORMAL HIGH (ref 70–99)
Potassium: 3.2 mEq/L — ABNORMAL LOW (ref 3.5–5.1)
Sodium: 139 mEq/L (ref 135–145)
Total Bilirubin: 0.7 mg/dL (ref 0.3–1.2)
Total Protein: 7.2 g/dL (ref 6.0–8.3)

## 2011-01-02 LAB — CBC
HCT: 41.9 % (ref 36.0–46.0)
Hemoglobin: 13.9 g/dL (ref 12.0–15.0)
MCH: 31.8 pg (ref 26.0–34.0)
MCHC: 33.2 g/dL (ref 30.0–36.0)
MCV: 95.9 fL (ref 78.0–100.0)
Platelets: 240 10*3/uL (ref 150–400)
RBC: 4.37 MIL/uL (ref 3.87–5.11)
RDW: 13.4 % (ref 11.5–15.5)
WBC: 7.7 10*3/uL (ref 4.0–10.5)

## 2011-01-02 LAB — URINALYSIS, ROUTINE W REFLEX MICROSCOPIC
Bilirubin Urine: NEGATIVE
Glucose, UA: NEGATIVE mg/dL
Hgb urine dipstick: NEGATIVE
Ketones, ur: NEGATIVE mg/dL
Nitrite: NEGATIVE
Protein, ur: NEGATIVE mg/dL
Specific Gravity, Urine: 1.014 (ref 1.005–1.030)
Urobilinogen, UA: 1 mg/dL (ref 0.0–1.0)
pH: 7 (ref 5.0–8.0)

## 2011-01-02 LAB — APTT: aPTT: 28 seconds (ref 24–37)

## 2011-01-02 LAB — TYPE AND SCREEN
ABO/RH(D): AB POS
Antibody Screen: NEGATIVE

## 2011-01-02 LAB — SURGICAL PCR SCREEN
MRSA, PCR: NEGATIVE
Staphylococcus aureus: NEGATIVE

## 2011-01-04 LAB — CREATININE, SERUM
Creatinine, Ser: 0.95 mg/dL (ref 0.4–1.2)
GFR calc Af Amer: >60 mL/min (ref >60)
GFR calc non Af Amer: >60 mL/min (ref >60)

## 2011-01-26 ENCOUNTER — Other Ambulatory Visit: Payer: Self-pay | Admitting: Physical Medicine and Rehabilitation

## 2011-01-26 DIAGNOSIS — M545 Low back pain: Secondary | ICD-10-CM

## 2011-02-02 ENCOUNTER — Ambulatory Visit
Admission: RE | Admit: 2011-02-02 | Discharge: 2011-02-02 | Disposition: A | Discharge status: Home / Self Care | Payer: Medicare Other | Source: Ambulatory Visit | Attending: Physical Medicine and Rehabilitation | Admitting: Physical Medicine and Rehabilitation

## 2011-02-02 DIAGNOSIS — M545 Low back pain: Secondary | ICD-10-CM

## 2011-02-02 MED ORDER — GADOBENATE DIMEGLUMINE 529 MG/ML IV SOLN
20.0000 mL | Freq: Once | INTRAVENOUS | Status: AC | PRN
  Administered 2011-02-02: 20 mL via INTRAVENOUS

## 2011-02-23 HISTORY — PX: DOPPLER ECHOCARDIOGRAPHY: SHX263

## 2011-03-03 ENCOUNTER — Other Ambulatory Visit: Payer: Self-pay | Admitting: Neurological Surgery

## 2011-03-03 DIAGNOSIS — M48061 Spinal stenosis, lumbar region without neurogenic claudication: Secondary | ICD-10-CM

## 2011-03-07 NOTE — Assessment & Plan Note (Signed)
[name] returns to the clinic today for followup evaluation.  She  reports that she did see Dr. Danielle Dess, local neurosurgeon.  Dr. Danielle Dess had  done prior lumbar surgery with arthrodesis at L3-L5 in March 2003.  Unfortunately, the patient has experienced increased pain of her left  leg and she was told she has a herniated disk below the level of fusion.  Dr. Danielle Dess was considering surgery, but the patient is not interested in  any surgery at this time as long as she has sufficient pain relief.  She  does use her morphine, Soma, and hydrocodone for most of her pain  relief.  She has had a recent refill on hydrocodone and morphine, but  needs a refill on the Soma and alprazolam.  Overall, she is doing fairly  well, although she still has some stress-related to her son who had been  living with her, and her daughter who gives her a lot of grief regarding  her pain medicines.   REVIEW OF SYSTEMS:  Positive for urinary retention.   MEDICATIONS:  1. Celexa 40 mg daily.  2. Premarin 0.625 mg daily.  3. Synthroid daily.  4. Allegra daily.  5. Amitriptyline 25 mg at bedtime.  6. Ambien 10 mg at bedtime.  7. Morphine sulfate continuous release 30 mg q.i.d.  8. Soma 350 mg t.i.d. p.r.n. (3 per day).  9. Flexeril 10 mg 2 tablets p.o. at bedtime.  10.Pravachol daily.  11.Xanax 0.5 mg t.i.d. p.r.n. (0-3 per day).   PHYSICAL EXAMINATION:  GENERAL:  Well-appearing, fit adult female in  mild-to-moderate acute discomfort.  VITAL SIGNS:  Blood pressure 105/61 with a pulse of 96, respiratory rate  18, and O2 saturation 96% on room air.  EXTREMITIES:  She ambulates without any assistive device.  She has 5-/5  strength throughout the bilateral upper and lower extremities.   IMPRESSION:  1. Status post lumbar decompression/arthrodesis at L3-L5 in March      2003, with chronic low back pain status post hemilaminectomy and      partial facetectomy in April 1999.  2. Chronic lumbar spondylosis.  3.  Cervical stenosis at C5-C6 and C6-C7.  4. History of renal cell carcinoma with recent resection of the right      and prior resection of the left kidney tumor.  5. Status post rotator cuff repair, right upper extremity.  6. Right total knee replacement, October 2007.  7. Bilateral breast reduction surgery.  8. History of fall with left wrist fracture.  9. History of melanoma.  10.Kyphoplasty.  11.History of ovarian cancer.   At the present time, we have refilled the patient's Soma along with her  Ambien and alprazolam.  We will plan on seeing the patient in followup  in this office in approximately 3 months' time with refills prior to  that appointment if necessary.           ______________________________  [name], M.D.     DC/MedQ  D:  08/06/2008 10:22:30  T:  08/06/2008 22:21:16  Job #:  [id number]

## 2011-03-07 NOTE — Discharge Summary (Signed)
NAMEMALAY, FANTROY NO.:  [id number]   MEDICAL RECORD NO.:  [id number]          PATIENT TYPE:  INP   LOCATION:  4737                         FACILITY:  [facility]   PHYSICIAN:  Ritta Slot, MD     DATE OF BIRTH:  1951-02-05   DATE OF ADMISSION:  04/28/2008  DATE OF DISCHARGE:  04/30/2008                               DISCHARGE SUMMARY   DISCHARGE DIAGNOSES:  1. Recurrent pericarditis, steroids instituted this admission.  2. History of pericardial window, May 2009.  3. History of renal cell cancer, status post nephrectomy in 2005 with      recurrence and partial nephrectomy in November 2008.  4. History of ovarian cancer, status post total abdominal hysterectomy      and bilateral salpingo-oophorectomy in 2006.  5. History of prior melanoma status post surgical resection.  6. Degenerative joint disease with chronic back pain, on multiple pain      medicines.  7. Treated dyslipidemia.   HOSPITAL COURSE:  The patient is a 60 year old female is followed by Dr.  Cleta Alberts.  She has had recurrent pericarditis.  She had a pericardial window  in May 2009.  There is no evidence of malignancy at biopsy.  She has had  recurrent pericarditis and was hospitalized again in [place], started on  high-dose aspirin.  She also was put on ibuprofen and colchicine.  She  was seen in the office by Dr. Lynnea Ferrier on April 28, 2008.  He felt she had  failed medical therapy as an outpatient.  Echocardiogram showed normal  LV function but no evidence of tamponade, no significant hemodynamic  compromise from a small pericardial effusion.  The patient had been  seeing Dr. Arline Asp.  He felt she might have autoimmune pericarditis and  did not find any evidence of malignant pericarditis.  Dr. Lynnea Ferrier  admitted her to telemetry on April 28, 2008, and started on IV steroids  with plans to start p.o. steroids and then take her off nonsteroidal  antiinflammatories.  She was seen in consult by the  Hematology/Oncology  Service.  She was also seen in consult by Dr. Estill Bakes.  A cardiac MRI  was obtained.  The results of this confirm pericarditis, there is no  obvious malignancy noted.  PET scan was considered but did not felt to  be useful at this time.  The patient's symptoms improved on steroids.  Dr. Phylliss Bob would like to see the patient in followup.  We feel she can be  discharged on April 30, 2008, on tapering steroid dose.  She will follow  up with Dr. Estill Bakes and Dr. Lynnea Ferrier, and the patient has requested a  transfer to Dr. Darnelle Catalan for further hematology/oncology followup, and  this will be arranged.   LABORATORY DATA:  White count 5.7, hemoglobin 11.7, hematocrit 34,  platelets 183, sodium 140, potassium 4.1, BUN 24, creatinine 1.12.  The  sed rate is 30, retake count 1.4, reticulocyte count 53.8, iron level  220, B12 levels 358, folate 6.7, ferritin 289, LDH 146, CEA 3.8, C-  reactive protein 2.4, homocysteine level 17.3, TSH  0.50, liver functions  were normal PTT-LA 41, DRVVT 42.6, lupus anticoagulant was not detected.  Telemetry shows sinus rhythm.   EKG from the office shows sinus rhythm, nonspecific anterior ST changes.  Previous Myoview shows normal perfusion with an EF of 68%.  Echocardiogram on [place] 2, 2009, showed good LV function with an EF of  50%-60% with a small pericardial effusion.   DISCHARGE MEDICATIONS:  1. Aspirin 325 mg a day.  2. Gabapentin 300 mg daily.  3. Citalopram 40 mg a day.  4. Triamterene and hydrochlorothiazide daily.  5. Amitriptyline 25 mg nightly.  6. KCL 20 mEq a day.  7. Premarin 0.625 mg daily.  8. Cyclobenzaprine 20 mg daily.  9. Synthroid 0.1 mg daily.  10.Morphine 3 mg q.6 h. P.r.n.  11.Alprazolam 0.5 mg q.i.d.  12.Colchicine 0.6 mg 3 times a day.  13.Dilaudid p.r.n.  14.Ibuprofen 400 mg b.i.d. if needed.  15.Zolpidem 10 mg q.24 h.  16.Prednisone on a tapering schedule of 40 mg a day for 4 days, 30 mg      a day for 5 days, 20  mg a day for 5 days, and 10 mg a day after      that.   DISPOSITION:  The patient is discharged in stable condition.  She will  follow up with Dr. Darnelle Catalan, Dr. Estill Bakes, and Dr. Lynnea Ferrier.      Abelino Derrick, P.A.      Ritta Slot, MD  Electronically Signed    LKK/MEDQ  D:  04/30/2008  T:  05/01/2008  Job:  [id number]   cc:   Valentino Hue. Magrinat, M.D.  Areatha Keas, M.D.  Salvatore Decent Dorris Fetch, M.D.  [place] Cleta Alberts, M.D.

## 2011-03-07 NOTE — Assessment & Plan Note (Signed)
HISTORY OF PRESENT ILLNESS:  [name] returns to the clinic today for  followup evaluation. She has had recent kidney surgery on the right side  for recurrent renal cell carcinoma. She was hospitalized for  approximately 4 days with Dr. Isabel Caprice. The patient has used a slight  increased amounts of her morphine sulfate after her release from the  hospital. She reports that she was only given minimal pain medicines  from Dr. Isabel Caprice at the time of discharge. She does need a refill on her  hydrocodone and morphine in the office today. She has been feeling weak  lately and was not placed on any iron supplements at the time of  discharge. We have decided to start her on a months supply of iron  supplements in the office today.   REVIEW OF SYSTEMS:  Noncontributory.   MEDICATIONS:  Celexa 40 mg daily, Premarin 0.625 mg daily, Synthroid  daily, Allegra daily, amitriptyline 25 mg q.h.s., Ambien 10 mg q.h.s.,  morphine sulfate continuous release 30 mg q.i.d., Soma 250 mg t.i.d.  p.r.n., Flexeril 10 mg 2 tablets p.o. q.h.s., Pravachol daily, Xanax 0.5  mg t.i.d. p.r.n., hydrocodone 5/325 1 to 2 tablets p.o. q.i.d. p.r.n.   PHYSICAL EXAMINATION:  GENERAL:  A well appearing adult female in mild  acute discomfort.  VITAL SIGNS:  Blood pressure 122/67 with pulse of 95, respiratory rate  18, and O2 saturation 99% on room air.  CHEST:  She has a 9 inch scar in her right flank where surgery was  performed with the skin completely closed without drainage.  NEUROLOGIC:  She has 5-/5 strength throughout. She ambulates without any  assistive device.   IMPRESSION:  1. Status post lumbar decompression, arthrodesis L3-L5 March 2003.  2. Chronic low back pain, status post hemi-laminectomy and partial      facetectomy April 1999.  3. Chronic lumbar spondylosis.  4. Cervical stenosis C5-6 and C6-7.  5. History of renal cell carcinoma with recent resection on the right      and prior resection on the left.  6. Status post rotator cuff tear right upper extremity October 2006.  7. Prior right total knee replacement October 2007.  8. Breast reduction surgery bilaterally.  9. History of fall with left wrist fracture.  10.History of melanoma resection.  11.History of kyphoplasty.   In the office today, we did refill the patient's hydrocodone and  morphine, as of today, October 10, 2007. We also gave her a new  prescription for Nu-Iron at 150 mg 1 tablet b.i.d., total of 60, to  cover her for one months time. We will plan on seeing her in followup in  approximately 3 to 4 months with refill prior to that appointment if  necessary.           ______________________________  [name], M.D.     DC/MedQ  D:  10/10/2007 10:58:46  T:  10/10/2007 19:02:57  Job #:  [id number]

## 2011-03-07 NOTE — H&P (Signed)
[name], FILIPPI NO.:  [id number]   MEDICAL RECORD NO.:  [id number]          PATIENT TYPE:  INP   LOCATION:  4730                         FACILITY:  [facility]   PHYSICIAN:  Zenaida Deed. Mayford Knife, M.D.DATE OF BIRTH:  [birth year]   DATE OF ADMISSION:  03/23/2008  DATE OF DISCHARGE:                              HISTORY & PHYSICAL   PRIMARY CARE PHYSICIAN:  Tina Edin, MD of [place].   ONCOLOGIST:  Tina Dada, MD   UROLOGY:  Tina Fuller, MD   PAIN CLINIC PHYSICIAN:  Tina Dense, MD   CHIEF COMPLAINT:  Chest pain.   HISTORY OF PRESENT ILLNESS:  This is a 60 year old white female with a  history of multiple cancers including renal cell carcinoma, melanoma,  and ovarian cancer.  Recently discharged after an acute pericardial  effusion status post drainage with a window by [facility] and a chest tube,  presents with acute onset of left chest pain and pressure radiating to  her upper back.  Pain is described as intermittently sharp, but mostly  pressure on the left side that woke her from sleep this morning.  She  denies any associated dizziness, shortness of breath, or diaphoresis.  It is not exacerbated by anything including movement or eating and it is  not relieved by anything including rest, nitro, or the Dilaudid that she  took at home.  She also reports no relief with antacids.  Since arrival  to the Emergency Department, she reports some improvement after the IV  Dilaudid times 2 mg, but no change after getting the nitro.  The patient  currently is being worked up by Dr. Cleta [name] for the etiology of a  pericardial effusion.  The cytology shows likely reactive mesothelial  cells, but no frank malignancy was identified.  She has yet to be set up  with cardiology but her husband is seen by Dr Clarene [name].  A chest x-ray in  the emergency department was negative and point of care enzymes were  negative x1.  An EKG shows diffuse nonspecific T-wave changes, which is  a change from her previous EKG.   REVIEW OF SYSTEMS:  Negative except for the above.   ALLERGIES:  Ampicillin causes itching.   MEDICATIONS:  1. Celexa 40 mg p.o. daily.  2. Synthroid 100 mcg p.o. daily.  3. Amitriptyline 25 mg p.o. nightly.  4. Ambien 10 mg p.o. nightly.  5. MS Contin 30 mg q.i.d. p.r.n. pain.  The patient reports she has      not been taking this since being given the Dilaudid.  6. Dilaudid 4 mg q.4 h. p.r.n. pain.  7. Soma 350 mg p.o. t.i.d. p.r.n.  8. Flexeril 20 mg p.o. nightly p.r.n.  9. Xanax 0.5 mg p.o. q.i.d. p.r.n.  The patient reports taking      approximately one per day, although she does skip some days and      take upwards of 2 to 3 on others.  10.Vicodin p.r.n.  11.Premarin 0.65 mg p.o. daily since the age of 60.   PAST MEDICAL HISTORY:  1. Multiple  cancers including renal cell carcinoma status post partial      left and right nephrectomies, ovarian cancer status post resection      by Dr. Vincente [name] and melanoma.  2. Chronic severe back pain with cervical stenosis and status post      multiple surgeries.  She sees a pain clinic for this and has a pain      contract.  3. Recent admission for pericardial effusion status post window by Dr.      Dorris [name] with fluid studies showing reactive mesothelial      cells.  4. Anemia of iron deficiency status post IV iron infusion on May 2009      and a unclear etiology per Dr. Arline [name].  5. Hyperlipidemia although the patient is not on a statin.   PAST SURGICAL HISTORY:  1. Status post lumbar decompression on March 2003.  2. Status post hemilaminectomy and partial facetectomy on February 08, 1989.  3. Right total knee replacement.  4. TAH and BSO status ovarian cancer.   SOCIAL HISTORY:  The patient lives with her husband and is on primary  disability due to her back pain.  She denies any alcohol, tobacco, or  drugs.   FAMILY HISTORY:  Her father died at 56s with lung cancer.  Her mother   died in her 80s with stomach cancer.  She has 2 obese siblings, both  with diabetes.  She has 1 daughter with melanoma and a sister with  bipolar.   PHYSICAL EXAMINATION:  VITAL SIGNS:  Temperature 97.8, heart rate 97 to  102, blood pressure 87-108/56-59, and respirations is 20.  She is 99% on  room air.  GENERAL:  This is a morbidly obese white female who appears her stated  age, alert, oriented x3, and pleasant.  No apparent distress.  HEENT:  Pupils equal, round, and reactive to light.  Extraocular  movements intact.  Moist mucous membranes.  Upper dentures are in place.  Atraumatic and normocephalic.  NECK:  Supple.  Full range of motion.  CARDIOVASCULAR:  Regular rate and rhythm.  No murmurs.  Unable to  appreciate a rub.  Pericardial window incision is clean, dry, intact,  and nontender.  PULMONARY:  Clear to auscultation bilaterally.  No wheezes, rhonchi, or  crackles.  ABDOMEN:  Obese, positive bowel sounds, soft, nontender, and  nondistended.  EXTREMITIES:  No edema.  2+ radial and pedal pulses.  NEURO:  Alert and oriented x3, otherwise nonfocal.  SKIN:  Warm and dry and no apparent rashes.   LABORATORY DATA:  EKG showed nonspecific T-wave changes diffusely, which  is new from her previous EKG.  Chest x-ray showed nothing acute with  some mild chronic interstitial changes.  BNP was 67.  INR is 1.2.  Point  of care enzymes were negative x1.  The pericardial fluid analysis from  earlier this month shows a negative culture with atypical mesothelial  cells.  Differential diagnosis including florid atypical mesothelial  cells, but metastatic carcinoma cannot be excluded.  She also had a CRP  of 194, ESR of 64, ANA that was negative, and a rheumatoid factor that  was 7 on her previous admission.   ASSESSMENT AND PLAN:  This is a 60 year old white female with multiple  primary cancers and recent pericardial effusion of unknown etiology,  status post drainage 2 weeks ago and now  with acute chest pain.  1. Chest pain.  This is atypical in nature and  likely not secondary to      coronary artery disease given her history, negative point of care      and other risk factors, etc.  Concern for pericarditis, given her      EKG changes and then the history of the pericardial effusion.      Reaccumulation of the fluid is also a possibility although the      chest x-ray does not appear to show enlargement of the cardiac      silhouette at this time.  Differential diagnosis also includes GI      etiology, which could be reflux, pancreatic or hepatic source.  I      doubt pulmonary cause such as pneumonia or pulmonary embolus, given      a negative chest x-ray and no hypoxia or shortness of breath and no      calf discrepancies.  We will admit to telemetry for observation and      to empirically treat for pericarditis, while ruling out      reaccumulation of fluid versus an myocardial infarction.  Gave her      Toradol 30 mg IV push in the ED and I will start aspirin 650 mg      p.o. t.i.d. per literature search on pericarditis.  We will hold on      colchicine given limited data and also partial nephrectomies along      with holding prednisone until the aspirin takes effect.  I did not      opt to use NSAIDs in this instance, because we are ruling her out      for an myocardial infarction, although I think that is low      probability.  We will cycle her enzymes and repeat the EKG in the      morning.  Check an echo in the morning and Dilaudid for pain and      workup with pericardial effusion as below.  2. Pericardial effusion.  The etiology of this is still clear.  There      is no frank malignancy  seen on the path.  Pathology was still      concerning given her history of multiple cancers.  Her husband sees      Dr. Clarene [name] and we will attempt to get the patient set up with him      as an outpatient for workup.  3. History of multiple primary cancers.  The patient has  been offered      genetic testing by Dr. Cleta [name] that she cannot afford because she      reports her insurance does not cover it.  I was under the      impression that her insurance would pay for this if it was      medically indicated, which it most certainly is.  We will consider      calling genetics in the morning versus social work to determine if      this might be case and that she would benefit from this greatly      including her family members.  4. Chronic back pain.  She had a pain contract but I did believe she      is currently in legitimate pain.  I will give her Dilaudid 2 mg IV      push q.4 h. p.r.n. and taper to p.o. as quickly as possible.  We      will need to be in contact  closely with Dr. Thomasena Edis, who is her      pain doctor to reinforce her contract and be sure that we have open      lines of communication with him for any changes that need to be      made.  5. Polypharmacy.  Her list medications for chronic pain and insomnia      is extensive and has a potential to be quite dangerous.  We will      maintain her for now on her medicines on a p.r.n. basis as she is      on telemetry, but we will discuss with the team possibly tapering      or consolidating some of them.  6. Anemia.  The patient has a history of iron deficiency anemia of      undetermined cause.  She reports being followed by Dr. Arline [name] at      the [facility].  We will check her CBC and follow up with him as      an outpatient.  7. FEN, GI heplocked, heart healthy diet, check her electrolytes and      replace as needed.  8. Prophylaxis, SCDs and PPI.  9. Disposition.  I anticipate a 24-hour to 48-hour admission with this      patient to obtain an echo to rule out for myocardial infarction and      set her up with cardiology as an outpatient and also to control her      pain.      Lupita Raider, M.D.  Electronically Signed      Zenaida Deed. Mayford Knife, M.D.  Electronically Signed   KS/MEDQ   D:  03/23/2008  T:  03/24/2008  Job:  [id number]   cc:   Brett Canales A. [name], M.D.

## 2011-03-07 NOTE — Assessment & Plan Note (Signed)
HISTORY:  A 60 year old female with history of lumbar spondylosis and  spondylolisthesis, who underwent L3 and L4 laminectomy with posterior  interbody fusion, implantable bone spacers on January 06, 2002.   She has been treated with narcotic analgesic medications for number of  years.  She has been on hydrocodone back in 2005.  She also has history  of neck pain with left cervical stenosis at C5-6 and C6-7.  She has been  treated in the past for renal cell carcinoma as well by both Neurology  and HEM/ONC.  She has had knee problems, has had treatment by Dr. Valma Cava, injections of corticosteroid and Hyalgan.  She switched from  hydrocodone and oxycodone in 2006.  Eventually, underwent a right total  knee arthroplasty on August 09, 2006.  She has had some problems with  medication management claiming taking too many of her medicines when she  was on morphine.  She has also a history of L1 compression fracture and  had a L3-L5 fusion.  She has had recurrent renal cell mass in 2008.  She  has had history of chest pain, not felt to be cardiac or pulmonary  source.  Question pericarditis.  Underwent pericardial window in 2009.  She also trialed Dilaudid in the past for pain and that was in December  of 2009.   She indicates she has had recent MRI.  Last when I saw MRI of the lumbar  spine dated July 27, 2008, pedicle screw fusion at L3, L4, and L5,  pseudarthrosis at L4-5 level, central left-sided disk protrusion at L4,  L5, and S1 level.   Pain is 7/10.  Interferes with activity at 7.  This is fairly baseline  for her.  She seems to fluctuate in the 6-7/10 range on her medications.  Her review of system is positive for numbness, depression, weakness,  urine problems, night sweats.  No past medical history as noted above.  Heart disease, thyroid, renal carcinoma, partial nephrectomy, and  osteoarthritis.   SOCIAL HISTORY:  Married.  Denies any drug or alcohol abuse.   PHYSICAL  EXAMINATION:  Blood pressure 117/67, pulse 84, respirations 18,  and 02 sat is 100% on room air.   General, no acute distress.  Orientation x3.  Affect is alert.  Gait is  normal.  She has good strength in upper and lower extremities.  She has  mildly reduced sensation in hands.  Negative Phalen's.  Negative  Spurling's.   Upper and lower extremity range of motion is within functional limits.   IMPRESSION:  1. Status post lumbar decompression and arthrodesis at L3-L5 seven      years ago.  2. Cervical stenosis at C5-6 and C6-7.  3. History of renal cell carcinoma.  4. Osteoarthritis status post right total knee replacement.   PLAN:  1. We will continue on her current medications.  We have that done in      [facility] Check, which appeared to be consistent in      terms of having Dr. Thomasena Edis filled her medications with the      exception of phentermine, which they really do not want to fill      with and the Ambien, which her obstetrician-gynecologist prescribed      for her.  Plan to reduce Xanax over time.  2. Trial the nortriptyline in place of amitriptyline and Flexeril      together.  3. Hydrocodone 10/325 t.i.d.  4. MS Contin 60 mg b.i.d.  and MS Contin 30 t.i.d.  Xanax wean 0.5      b.i.d. x3 weeks and daily x2 weeks, then stop.  Gabapentin 100 mg      nightly x7 days, b.i.d. x7 days, and t.i.d. x7 days.      Erick Colace, M.D.  Electronically Signed     AEK/MedQ  D:  01/07/2009 17:21:11  T:  01/08/2009 03:16:07  Job #:  [id number]   cc:   Stefani Dama, M.D.  Fax: [phone]   Erasmo Leventhal, M.D.  Fax: [phone]

## 2011-03-07 NOTE — Op Note (Signed)
NAMELARAH, KUNTZMAN NO.:  [id number]   MEDICAL RECORD NO.:  [id number]          PATIENT TYPE:  INP   LOCATION:  1445                         FACILITY:  [facility]   PHYSICIAN:  [name], M.D.  DATE OF BIRTH:  1951-03-07   DATE OF PROCEDURE:  09/09/2007  DATE OF DISCHARGE:                               OPERATIVE REPORT   PREOPERATIVE DIAGNOSIS:  Right renal mass.   POSTOPERATIVE DIAGNOSES:  1. Right renal mass.  2. Right renal cell carcinoma.   PROCEDURE PERFORMED:  Right flank exploration with right partial  nephrectomy.   SURGEON:  [name], M.D.   ASSISTANT:  Heloise Purpura, MD   ANESTHESIA:  General endotracheal.   INDICATIONS:  Ms. Wonders is a 60 year old female.  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  She also has a history of borderline  ovarian tumor.  The patient recently had a CT scan which showed what  appeared to be a new suspicious 3.5 cm mixed cystic and solid  abnormality in the lower pole of the right kidney.  For a long period of  time she was known to have a small category 1 cyst of the lower pole of  her kidney which showed no worrisome features whatsoever.  Her most  recent CT scan was a dramatic change from 12 months earlier.  We felt  that this lesion was certainly suspicious enough to warrant exploration  with partial nephrectomy since there appeared to be a high likelihood  that this was indeed a new renal cell carcinoma.  Ms. Savich appeared to  understand the advantages and disadvantages of the surgical approach.  She appeared to understand the potential complications and risks of  surgery of this nature.  She was told that this operative procedure  would be more difficult than her original surgery given the location of  the tumor which was significantly into  the parenchyma and probably  adjacent to the collecting system.  She understood that radical  nephrectomy could be necessary because of failure to obtain clear  margins and also because of potential for complications or bleeding  issues.  She presents now for the procedure.   TECHNIQUE AND FINDINGS:  The patient was brought to the operating room  where she had successful induction of general endotracheal anesthesia.  She was then placed a standard flank position with the right flank up.  The flank had previously been marked to determine the correct side.  Axillary roll was used.  All extremities were very carefully padded.  The beanbag was utilized.  The kidney rest was utilized and the table  was flexed, giving [place] again a standard flank approach.  After careful  padding and protection of all extremities and after placement of the  Foley catheter, the patient was prepped and draped in the usual manner.   A standard right flank incision was then performed off the tip of the  twelfth  rib.  We found her fascia to be extremely attenuated.  The  standard muscular layers were incised and the posterior aspect of the  fossa was then entered exposing the inferior pole of the kidney.  Gerota's fascia was identified and the plane between the peritoneum and  Gerota's fascia was established.  The lower pole of the kidney was then  totally freed up off the posterior musculature.  The canal vein was  identified and followed towards its insertion into the vena cava.  The  ureter was also identified and then encircled with a vascular tape.  We  then elected to free the upper pole of the kidney to allow for better  mobilization and also for slush application of the kidney for the  ischemia portion.  We went across Gerota's fascia at the top of the  kidney.  The adrenal gland was identified and preserved.  Clips were  used across the top of Gerota's fascia which then allowed excellent  mobilization of the  upper pole of the kidney.  Once the kidney was  completely mobilized, we were able to identify a single renal artery and  renal vein.  These structures were individually dissected free.  They  were encircled with vessel loops individually.  Once the vasculature had  been identified, we began to carefully take off some of Gerota's fascia  overlying the inferior pole of the right kidney.  We could palpate this  mass which appeared to be both cystic and solid in nature.  Once we had  the Gerota's fascia off the parenchyma of the kidney we could see a  fairly clear demarcation between the tumor and the normal parenchyma.  We extended our margin around the renal capsule for about a centimeter  beyond the abnormality circumferentially around this mass.  Once the  renal capsule was scored, we asked anesthesia to give [place] 12.5 g of  mannitol.  After the mannitol had been administered, we placed a bowel  bag around the kidney.  The artery was then clamped with a bulldog clamp  and subsequently the vein was also clamped.  Once these structures were  clamped, we immediately provided ice slush around the kidney and allowed  the kidney to be subjected to hypothermia with the frozen slush mixture  for 10 minutes.  At the completion of 10 minutes, we went ahead with the  resection of the tumor.  Primarily scissors were utilized..  We felt  that we obtained a nice margin of normal parenchyma around the tumor.  There were two small entry points where we clearly were in the  collecting system.  Hemostasis was excellent.  Tumor was then sent for  frozen section.  The open collecting system was closed with some running  4-0 Vicryl suture.  The whole base of the resection margin was then  carefully fulgurated with the argon beam photocoagulator.  We then  utilized FloSeal with a Surgicel bolster.  We used horizontal mattress  sutures of Vicryl to reapproximate the renal parenchyma over this  FloSeal and  Surgicel bolster.  The vasculature was then released after  approximately 15-20 minutes of ischemia time.  Hemostasis was really  quite good.   We watched the defect in the kidney for at least 10 minutes.  In the  interim frozen sections revealed that this was renal cell carcinoma.  There appeared to be a margin of normal parenchyma.  There was really  minimal to no significant oozing or bleeding from the  resection margins  and the defect remained quite dry.  A copious amount of saline was used  to irrigate the flank.  A drain was then placed alongside the kidney  through a separate stab incision in the inferior aspect of the incision.  The drain was anchored to the skin.  The fascia was then closed with  running #1 PDS suture with three layers anteriorly and two posteriorly  in an anatomic manner.  Skin was infiltrated with Marcaine.  Clips were  used for the skin closure.  Urinary output remained quite good during  the procedure.  The patient had no hemodynamic instability and remained  quite stable.  Estimated blood loss was 700 mL.  All sponge and needle  counts were correct.  She was brought to recovery room in stable  condition.           ______________________________  [name], M.D.  Electronically Signed     DSG/MEDQ  D:  09/09/2007  T:  09/10/2007  Job:  [id number]

## 2011-03-07 NOTE — Procedures (Signed)
[name], [name]:  [id number]   MEDICAL RECORD NO.:  [id number]          PATIENT TYPE:  OUT   LOCATION:  XRAY                         FACILITY:  [facility]   PHYSICIAN:  Erick Colace, M.D.DATE OF BIRTH:  [birth year]   DATE OF PROCEDURE:  DATE OF DISCHARGE:  03/16/2009                               OPERATIVE REPORT   [name] returns today.  She has had EMG to evaluate upper and lower  extremity pain and weakness.  Her hand symptoms woke her up at night.  She did have signs of left median neuropathy at the wrist which looked  moderate.  We discussed results as well as splinting for the next month  and if no improvement, injection.  We also reviewed that she does have a  peroneal neuropathy left lower extremity which is mild, but maybe  explaining some of her left foot and ankle numbness and tingling  symptoms.  Her main complaint is left hip pain.  She points over the  lateral side of the hip.  Pain is rated as 8/10 on average.  She does  have her back pain as well as left shoulder pain which is her second  most bothersome symptoms.  She has had no falls.  No other trauma.   REVIEW OF SYSTEMS:  Positive for night sweats.   MEDICAL HISTORY:  Positive with thyroid disease, kidney cancer.  She is  having a surveillance CT scan this week.   SOCIAL HISTORY:  Married, lives with her husband, retired.   PHYSICAL EXAMINATION:  VITAL SIGNS:  Her blood pressure is 141/80, pulse  86, respirations 18, O2 sat 98% on room air.  Oswestry score 64% today.  NEUROLOGIC:  Orientation x3.  Affect is bright and alert.  MUSCULOSKELETAL:  Gait is normal.  Upper and lower extremity strength is  5/5 with exception of left ankle dorsiflexors 4.  She has no light touch  sensory deficit in the hand, but does have on the dorsum of left foot.  She has negative impingement signs, left shoulder.  Negative adduction  testing.  Negative pain with abduction testing.  Has mildly reduced  internal/external rotation, left shoulder.  Neck range of motion is 50%  range forward flexion/extension, lateral rotation, bending.   IMPRESSION:  1. Lumbar postlaminectomy syndrome.  2. Cervical spinal stenosis with the neck pain maybe contributing to      shoulder pain as well given her shoulder exam to show some mildly      reduced range of motion.  3. Left hip pain appears to be more of a trochanteric bursitis.  4. Left carpal tunnel syndrome, median neuropathy at the wrist noted      on EMG testing.   PLAN:  1. Wrist splint to the left upper extremity on nightly, off q.a.m.  2. For her lumbar postlaminectomy syndrome, we will go ahead and bump      up her short-acting morphine to q.i.d. 15 mg.  Continue her MS      Contin CR 30 t.i.d.  3. We will increase her gabapentin to 300 mg nightly  and work up to      q.i.d. over the course of next couple of weeks.  This will      hopefully help some of her peroneal nerve symptoms.  Certainly does      not need AFO given her relatively strong dorsiflexors.  4. Shoulder x-rays two-view to see if she got any subacromial spurring      or other signs of DJD.  5. I will see her back in 1 month.  6. Left hip trochanteric bursa injection.      Erick Colace, M.D.  Electronically Signed     AEK/MEDQ  D:  03/16/2009 11:23:34  T:  03/17/2009 01:36:26  Job:  [id number]   cc:   Dr. Ernest Mallick, M.D.  Fax: [phone]   Dr. Hayden Rasmussen

## 2011-03-07 NOTE — H&P (Signed)
[name], [name]:  [id number]   MEDICAL RECORD NO.:  [id number]          PATIENT TYPE:  INP   LOCATION:  2920                         FACILITY:  [facility]   PHYSICIAN:  Zenaida Deed. Mayford Knife, M.D.DATE OF BIRTH:  [birth year]   DATE OF ADMISSION:  03/03/2008  DATE OF DISCHARGE:                              HISTORY & PHYSICAL   PRIMARY CARE PHYSICIAN:  Dr. Marice Potter, [facility].   CHIEF COMPLAINT:  Chest pain and dyspnea.   HISTORY OF PRESENT ILLNESS:  This is a 60 year old white female with a  history significant for cancer who comes to the emergency room with a 2-  3 day history of continuous substernal chest pain.  It is not exertional  and was not relieved with nitro paste in the emergency room.  Her chest  pain is worse with deep inspiration and laying flat.  It has been  associated with shortness of breath and tachycardia while in the  emergency room.  She denies any fevers or cough.  She reports that she  has had a normal exercise stress test within the last 6 months by Dr.  Clarene Duke.   REVIEW OF SYSTEMS:  Negative except as above.   PAST MEDICAL HISTORY:  1. Renal cell carcinoma, status post partial resection of the left      kidney several years ago and subsequent partial resection of the      right kidney in November 2008.  2. History of left ovarian cancer, status post resection by Dr.      Vincente Poli.  3. History of melanoma.  4. Hyperlipidemia but not taking statin.  5. Chronic back pain, status post multiple procedures as well as      cervical stenosis.   SURGICAL HISTORY:  1. Status post lumbar decompression and arthrodesis of L5-L3 in March      2003.  2. Status post hemilaminectomy and partial facetectomy in April 1990.  3. History of a right total knee replacement in October 2007.  4. History of a bilateral breast reduction.  5. History of kyphoplasty.   FAMILY HISTORY:  She reports that her father died in his 41s from lung  cancer.  Her mother died in her 39s from stomach cancer and she has two  obese siblings, both who have diabetes.   SOCIAL HISTORY:  She lives with her husband and is on permanent  disability.  She denies any alcohol, tobacco, or drug use.   ALLERGIES:  She reports that AMOXICILLIN causes itching.   MEDICATIONS:  1. Vicodin 5/325 one tablet four times daily as needed.  2. Xanax 0.5 mg p.o. t.i.d.  3. Flexeril 20 mg at bedtime.  4. Soma 350 mg three times daily as needed.  5. Celexa 40 mg daily.  6. Premarin 0.625 mg daily.  7. Synthroid 100 mcg p.o. daily.  8. Amitriptyline 25 mg p.o. at bedtime.  9. Ambient 10 mg p.o. at bedtime.  10.MS Contin 30 mg four times daily.   Vitals reveal temperature of 100.0; initial blood pressure was 110/63,  repeat was 96/51, which has  been improved to 106/57; respirations 22;  pulse 106; and she is 100% on 2 liters of oxygen.   LABS:  Reveal a white count of 10.6, hemoglobin 12.1, and platelets 176.  Point of care cardiac markers are negative x1 and a basic metabolic  panel is significant only for potassium of 3.1, otherwise is normal.   A chest CT is currently pending.   ASSESSMENT:  This is a 60 year old female with a 2-3-day history of  chest pain that is pleuritic in nature.  1. Pleuritic chest pain:  There is a strong suspicion for pulmonary      embolism given her history of cancer with concomitant use of      Premarin.  This diagnosis is support by tachycardia and a low grade      fever.  We will presumptively treat her with Lovenox 1 mg/kg now      until her chest CT comes back.  We will also help to alleviate her      pain with Toradol 30 mg q.6 h. as needed.  We will continue cardiac      markers and obtain an EKG in the morning, although she has few      coronary artery risk factors.  2. Chronic pain:  We will continue her home pain and anxiety medicines      as prescribed.      Sylvan Cheese, M.D.  Electronically  Signed      Zenaida Deed. Mayford Knife, M.D.  Electronically Signed    MJ/MEDQ  D:  03/03/2008  T:  03/04/2008  Job:  [id number]

## 2011-03-07 NOTE — Assessment & Plan Note (Signed)
[name] is a 60 year old female with lumbar spondylosis and  spondylolisthesis who has a history of L3-L5 fusion.  Has a history of  renal cell carcinoma with partial resection.  She has used morphine as  well as Dilaudid in the past.  Currently, he is on morphine but we  reduced her dosage to 30 mg t.i.d., the MS Contin as well as hydrocodone  10/325 t.i.d.  We weaned her off the Xanax, we weaned her off the [place],  and trialed nortriptyline in the place of amitriptyline and Flexeril.  She feels that she is clear.  She is on gabapentin 100 t.i.d. as well.   She does have some anxiety and wondering if she can have her Xanax back.   She was brought to me and said at that time her average pain is 6-7/10.  Pain interferes her activity at 5/10 level.  Pain does interfere with  household duties and shopping but otherwise independent.  She has  depression, anxiety, on her review of systems as well as urine  retention, and she is being tested for lupus for pain all over her body.   SOCIAL HISTORY:  Married, lives with her husband.   PHYSICAL EXAMINATION:  VITAL SIGNS:  Blood pressure is 125/58, pulse 91,  respirations 18, O2 sat 97% on room air.  GENERAL:  No acute stress.  Orientation x3.  Affect is bright.  Gait is  normal.  EXTREMITIES:  Her exam of upper and lower extremity and range of motion  is normal.  She has normal strength in upper and lower extremities with  mildly reduced sensation in her hands.   IMPRESSION:  1. Lumbar post laminectomy syndrome, tolerated reduction of her      medication, she feels more clear.  2. History of renal cell carcinoma, no active treatment for this at      the current time but getting monitored by Hematology/Oncology.  3. Cervical stenosis C5-C6, C6-7.   PLAN:  1. We will go ahead and set her for medial branch blocks, looking      another nonmedication options for her.  2. Switch her from hydrocodone 10/325 t.i.d. to morphine sulfate  immediate release 15 t.i.d., sure medial branch blocks be helpful,      maybe, able to drop this down some.   Discussed with the patient and in agreement with plan.      Erick Colace, M.D.  Electronically Signed     AEK/MedQ  D:  01/21/2009 15:39:18  T:  01/22/2009 03:46:20  Job #:  [id number]   cc:   Stefani Dama, M.D.  Fax: [phone]   Erasmo Leventhal, M.D.  Fax: [phone]

## 2011-03-07 NOTE — Discharge Summary (Signed)
[name], [name]:  [id number]   MEDICAL RECORD NO.:  [id number]          PATIENT TYPE:  INP   LOCATION:  4730                         FACILITY:  [facility]   PHYSICIAN:  Zenaida Deed. Mayford Knife, M.D.DATE OF BIRTH:  Jul 05, 1951   DATE OF ADMISSION:  03/23/2008  DATE OF DISCHARGE:  03/24/2008                               DISCHARGE SUMMARY   REASON FOR HOSPITALIZATION:  Chest pain.   DISCHARGE DIAGNOSIS:  Chest pain, ruled out for myocardial infarction.   OTHER DISCHARGE DIAGNOSES:  1. Multiple primary cancers including renal cell carcinoma, status      post left and right partial nephrectomies; ovarian cancer, status      post resection; and melanoma.  2. Chronic severe back pain with central stenosis, status post      multiple surgeries and chronic pain regimen, for which she is      enrolled in pain clinic.  3. Recent admission for pericardial effusion with window performed by      cardiovascular thoracic surgery with negative fluid studies.  4. Iron deficiency anemia, status post IV iron in May 2009.  5. Hyperlipidemia.  6. Status post lumbar decompression, March 2003.  7. Status post hemilaminectomy and partial facetectomy on February 08, 1989.  8. Status post right total knee replacement.  9. Status post total abdominal hysterectomy with bilateral salpingo-      oophorectomy status post ovarian cancer.   DISCHARGE MEDICATIONS:  1. Protonix 40 mg twice daily, this is a new medication.  2. Aspirin 650 mg 3 times daily, this is increased after previous dose      of aspirin.  3. Celexa 40 mg p.o. daily.  4. Synthroid 100 mcg p.o. daily.  5. Amitriptyline 75 mg nightly.  6. Ambien 10 mg nightly.  7. MS Contin 30 mg 4 times daily as needed.  8. Soma 350 mg as needed 3 times daily.  9. Flexeril 70 mg at night.  10.Xanax 0.5 mg 4 times daily as needed.  11.Vicodin as needed.  12.Dilaudid 4 mg every 4 hours as needed.  13.Premarin 0.65 mg daily.   DISCHARGE INSTRUCTIONS:  1. The patient is to take medications as mentioned previously.  2. The patient is to follow up with Dr. Cleta Alberts at [facility] on      [name] 9, 2009, at 3 p.m.  The patient is to follow up with      cardiologist Dr. Clarene Duke.  Cardiologist office will call the patient      with an appointment time.  Dr. Cleta Alberts to follow up for establishment      of cardiology followup.   SIGNIFICANT FINDINGS.:  Admission workup.  Chest x-ray on admission [name]  1, 2009, showed no acute cardiopulmonary findings with mild chronic  interstitial lung changes.  Admission EKG showed diffuse T-wave changes  in all leads.  Point-of-care cardiac markers on admission were negative.  INR on admission was 1.2.  PT was 15.0, PTT was 29.  BNP was 67 on  admission.  CMP on admission had a low potassium  of 2.6, slightly low  sodium at 134.  Liver function tests were within normal limits except  for a slightly low albumin at 3.3, and lipase on admission was less than  10.   Further inpatient workup, CBC with differential was performed.  On  admission; white blood cell count was 11.2, hemoglobin was 11.2, and  platelet count was 440.  Basic metabolic panel was performed on the  morning following after admission showed responsive potassium to  potassium repletion.  On admission, potassium was 3.6 on this panel.  Other values were within normal limits.  Cardiac enzymes were cycled for  2 more sets and were negative for both sets.  Cardiac echo on [name] 2,  2009, ejection fraction noted to be 50-60%, and no left ventricular wall  motion abnormalities.  Left ventricular wall thickness was again at the  upper limits of normal.  The feature was consistent with mild diastolic  dysfunction.  Aortic valve thickness was noted to be mildly increased.  There was mild mitral annular calcification and mild mitral valvular  regurgitation.  Estimated peak pulmonary artery systolic pressure was 29  mmHg, and there  was a small pericardial effusion anterior to the heart  and a trivial apical effusion.   BRIEF HOSPITAL COURSE:  The patient is a 60 year old female with a  history of multiple primary cancers, who presents to the emergency  department with chest pain.  Of note, the patient was recently  discharged from the hospital on Mar 09, 2008, after being diagnosed with  a pericardial effusion.  On this admission, pericardial window was  performed by cardiovascular thoracic surgery, and fluid studies were  sent.  Pericardial fluid studies showed atypical cells present favoring  atypical mesothelial cells, and biopsy of the pericardium at that time  showed mild inflammation.  The patient was admitted for rule out of  myocardial infarction.  Cardiac enzymes were negative x3 sets.  EKG  changes were noted as evidenced by T-wave changes in all leads.  Therefore, echocardiogram was ordered to evaluate for changes from  previous.  Of note, echo on Mar 21, 2008, showed an EF of 60-65%.  No  left ventricular wall motion abnormalities with a left ventricular wall  thickness that was the upper limits of normal.  Left atrium was mildly  dilated, and there was a small-to-possibly moderate free-flowing  circumferential pericardial effusion.  No evidence of tamponade.  Echocardiogram performed on this admission showed small pericardial  effusion anterior to the heart and trivial apical effusion.  The patient  was placed on high-dose aspirin 3 times daily.  For treatment of this  effusion, she received Toradol x1 dose in the emergency department.  Consideration was given to prednisone and colchicine.  Colchicine was  deferred given the patient's history of partial nephrectomies  bilaterally.  Prednisone was deferred and could be considered as an  outpatient if the patient continues to have symptoms.  Aspirin was  chosen as this is a cardiac preventative medication for myocardial  infarction and is also on  anti-inflammatory effective for resolving  pericardial effusion.  By hospital day #2, the patient's chest pain had  improved significantly, and the patient has effectively been ruled out  for myocardial infarction.  She was set for follow up with Dr. Ellis Parents  office on [name] 9, 2009, at 3:00 p.m., and family practice teaching  service attempted to set the patient up with follow up with Dr. Clarene Duke  of cardiology.  Dr. Fredirick Maudlin office stated  that they would have to call  the patient with an appointment.  Dr. Cleta Alberts, her primary care [name],  can follow this up at the next office visit.  Vital signs remained  stable on this admission, and other medical issues were stable as well.  The patient was placed on her chronic pain regimen with the exception of  making her scheduled or we would make the medications p.r.n.  The  patient reported good pain control with this regimen.   PROCEDURES:  A 2-D echocardiogram.   CONSULTATIONS:  None.   DISPOSITION:  The patient discharged to home with close followup.   DISCHARGE CONDITION:  Stable.   ISSUES FOR FOLLOWUP:  The patient should have cardiology followup.  We  have attempted to make appointment with Dr. Clarene Duke.  We would recommend  that Dr. Cleta Alberts followup whether or not the patient has been able to be  establish with Dr. Clarene Duke.  Symptoms should be monitored for possible  recurrence of effusion or evidence of pericarditis.  It still remains  possible that the patient's pericarditis and pericardial effusion from  both previous admission and this admission as a result of an occult  malignancy, especially given the patient's strong history of multiple  primary cancers, we would recommend that the patient have oncologic  followup for further monitoring of this.  We would defer this patient to  the patient's primary care [name] Dr. Cleta Alberts at [facility].      Myrtie Soman, MD  Electronically Signed      Zenaida Deed. Mayford Knife, M.D.   Electronically Signed    TE/MEDQ  D:  03/24/2008  T:  03/25/2008  Job:  [id number]   cc:   Thereasa Solo. Little, M.D.  [place] Cleta Alberts, M.D.

## 2011-03-07 NOTE — Assessment & Plan Note (Signed)
She follows up today for left shoulder pain.  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   EMG also suggested a potential C8 radiculopathy and this may be  explaining some of her symptoms as well.  She does have cervical spinal  stenosis by history.   Examination shows positive impingement sign on the left side.  She has  no pain over the [facility] joint.  No pain with crossed abduction either.   Her mood and affect are appropriate.   Gait is normal.  She does have positive Tinel's left wrist.   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   PLAN:  1. We will go ahead and do injection, left wrist today.  2. Scheduled for left shoulder injection and if this is not      particularly helpful in relieving her pain, may need to reimage her      neck and send her back to neurosurgery.   She has seen Dr. Everardo All in the past, but this was in 2009.       Erick Colace, M.D.  Electronically Signed     AEK/MedQ  D:  04/15/2009 14:34:25  T:  04/16/2009 02:15:16  Job #:  [id number]

## 2011-03-07 NOTE — Assessment & Plan Note (Signed)
[name] returns to clinic today for followup evaluation.  Unfortunately she has had a CAT scan done of her abdomen and pelvis that  is dated June 19, 2007 that showed right renal mass suspicious for  cancer.  She has been seen by Dr. Isabel Caprice and they are planning surgery  for resection of either full or partial right kidney September 09, 2007.  The patient does have a history of prior left partial nephrectomy for  renal cell carcinoma a few years ago.  She also has a history of  melanoma and prior ovarian cancer.   In terms of her pain medicines she still gets good relief with the  combination of the morphine sulfate continuous release along with the  hydrocodone, Soma and Flexeril.  She does need a refill on several  medicines in the office today.   REVIEW OF SYSTEMS:  Positive for coughing.   MEDICATIONS:  1. Celexa 40 mg daily.  2. Premarin 0.625 mg daily.  3. Synthroid daily.  4. Allegra daily.  5. Amitriptyline 25 mg nightly.  6. Ambien 10 mg nightly.  7. Morphine sulfate continuous release 30 mg q.i.d.  8. Soma 350 mg t.i.d. p.r.n.  9. Flexeril 10 mg two tablets p.o. nightly.  10.Pravachol daily.  11.Xanax 0.5 mg t.i.d. p.r.n.  12.Hydrocodone 5/25 one to two tablets p.o. q.i.d. p.r.n.   PHYSICAL EXAMINATION:  Well-appearing, middle-aged adult female in mild  acute discomfort with mild anxiety.  Blood pressure is 104/63 with a  pulse of 93, respiratory rate 18 and O2 saturation 96% on room air.  She  ambulates without any assistive device.  She has 5-/5 strength  throughout.   IMPRESSION:  1. Status post lumbar decompression/arthrodesis L3-L5 March 2003.  2. Chronic low back pains, status post hemilaminectomy and partial      facetectomy April 1999.  3. Chronic lumbar spondylosis.  4. Cervical stenosis C5-6 and C6-7.  5. History of renal cell carcinoma on the left with new diagnosis on      the right.  6. Status post rotator cuff repair right upper extremity  October 2006.  7. 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  8. Breast reduction surgery bilaterally.  9. History of fall with left wrist fracture.  10.History of melanoma resection.  11.History of kyphoplasty.   In the office today we did refill the patient's Ambien, Flexeril and  Soma over the next few days.  We refilled her hydrocodone and morphine  as of the end of the month to avoid coming in for a refill which is  somewhat difficult for  her to do.  Will plan on seeing her in followup in approximately 3  month's time with refills prior to that appointment as necessary.           ______________________________  [name], M.D.     DC/MedQ  D:  07/24/2007 10:04:14  T:  07/24/2007 13:05:44  Job #:  [id number]

## 2011-03-07 NOTE — Assessment & Plan Note (Signed)
Ms. Qazi returns to the clinic today for follow-up evaluation.  She  reports that she has developed some flank pain and hematuria.  She  reports that she is planning to see a [facility] physician on April 24, 2007.  She really does not want any further surgery or procedures, but  she feels she needs to have this evaluation, and I have encouraged her  to do so also.   Patient reports that she has been using morphine sulfate continuous  release, 30 mg, occasionally 3 times a day instead of 4 times a day.  In  place of that, she has increased her hydrocodone up to 8-10 tablets per  day.  She reports that overall, she is getting reasonable relief.  She  does need a refill on the morphine along with the hydrocodone and Ambien  in the office today.   MEDICATIONS:  1. Celexa 40 mg daily.  2. Premarin 0.625 mg daily.  3. Synthroid 100 mcg daily.  4. Allegra daily.  5. Amitriptyline 25 mg nightly.  6. Ambien 10 mg nightly.  7. Morphine sulfate continuous release 30 mg q.i.d.  8. Soma 350 mg t.i.d. p.r.n.  9. Flexeril 2 tablets p.o. nightly.  10.Pravachol daily.  11.Xanax 0.5 mg t.i.d. p.r.n.  12.Hydrocodone 5/325 1-2 tablets p.o. q.i.d. p.r.n.   REVIEW OF SYSTEMS:  Positive for urinary retention along with left-sided  pain.   PHYSICAL EXAMINATION:  GENERAL:  A reasonably well-appearing, slightly  overweight adult female in mild to no acute discomfort.  VITAL SIGNS:  Blood pressure 121/68 with a pulse of 82, respiratory rate  16, O2 saturation 96% on room air.  NEUROMUSCULAR:  Patient has -5/5 strength at the bilateral upper and  lower extremities.  She ambulates without any assistive device.   IMPRESSION:  1. Status post lumbar decompressive/arthrodesis, L3-5, March, 2003.  2. Chronic low back pain, status post hemilaminectomy and partial      facetectomy in April, 1999.  3. Chronic lumbar spondylosis.  4. Left cervical stenosis, C5-6 and C6-7.  5. History of renal cell  carcinoma.  6. Status post rotator cuff surgery at the right upper extremity in      October, 2006.  7. Status post right total knee replacement in October, 2007.  8. Status post breast reduction surgery bilaterally.  9. Status post fall with left wrist fracture.  10.Status post melanoma resection.  11.Status post kyphoplasty.   In the office today, we did refill the patient's pain medicines.  I have  encouraged her to not over-use the hydrocodone, as she will experience  Tylenol overdose.  She needs to keep that hydrocodone no more than eight  tablets per day.  She still has the morphine sulfate available to up to  4 times a day and certainly the combination of those has been helpful  for her.  We also refilled her Ambien in the office today.  We will plan  on seeing her in  followup in approximately three months time.  I have encouraged her to  see the physician at [facility] for the flank pain and hematuria.           ______________________________  [name], M.D.     DC/MedQ  D:  04/17/2007 10:31:48  T:  04/17/2007 14:39:00  Job #:  [id number]

## 2011-03-07 NOTE — Op Note (Signed)
NAMELAVENDER, [name]:  [id number]   MEDICAL RECORD NO.:  [id number]          PATIENT TYPE:  INP   LOCATION:  2033                         FACILITY:  [facility]   PHYSICIAN:  Salvatore Decent. Dorris Fetch, M.D.DATE OF BIRTH:  19-Feb-1951   DATE OF PROCEDURE:  03/06/2008  DATE OF DISCHARGE:                               OPERATIVE REPORT   PREOPERATIVE DIAGNOSIS:  Pericardial effusion.   POSTOPERATIVE DIAGNOSIS:  Pericardial effusion.   PROCEDURE:  Subxiphoid pericardial window.   SURGEON:  Salvatore Decent. Dorris Fetch, MD.   FIRST ASSISTANT:  Sheliah Plane, M.D.   SECOND ASSISTANT:  Rowe Clack, PA-C   ANESTHESIA:  General.   FINDINGS:  100 mL of bloody pericardial fluid, pericardium thickened,  fibrous stranding, no definitive masses seen.   CLINICAL NOTE:  [name] is a 60 year old woman who presented with  chest pain.  Workup was negative for pulmonary embolus and angina.  She  was found to have a mild-to-moderate pericardial effusion and  pericardial thickening on both CT and echocardiography raising the  concern of a malignant effusion.  She was advised to undergo a  diagnostic subxiphoid pericardial window.  The indications, risks,  benefits, and alternatives were discussed in detail with the patient.  She understood that this was primarily a diagnostic, might provide some  symptomatic relief without guarantee.  She understood and accepted the  risks and agreed to proceed.   OPERATIVE NOTE:  [name] was brought to the preop holding area on Mar 06, 2008.  An arterial blood pressure monitoring catheter was placed.  Intravenous antibiotics were administered.  PAS hose were placed for DVT  prophylaxis.  The patient was taken to the operating room, anesthetized,  and intubated.  The chest and abdomen were prepped and draped in usual  sterile fashion.  The incision was made over the lower portion of the  sternum and upper portion of the abdomen, it was carried  through the  skin and subcutaneous tissue.  The patient was markedly obese.  The  xiphoid was identified.  It was cartilaginous.  It was excised, there  was bleeding from a crossing vein at the lower portion of the sternum,  which was controlled with electrocautery.  The patient's tissues were  generally friable and bled easily.  Retraction was placed on the  diaphragmatic insertion of the pericardium, and the pericardium was  incised.  A suction catheter was placed into the pericardium and  approximately 100 mL of bloody fluid was evacuated.  This was sent for  cytology, culture, cell counts, chemistries, and serologies.  After  evacuating the fluid, a window was created in the pericardium excising a  piece of the pericardium, which was sent for pathology.  The strand was  visible on the epicardial surface of the heart.  No discrete nodules  were noted on either of the epicardial surface of the heart or the  pericardium.  A standard tip was gently passed around in the pericardium  to ensure there were no areas of loculated effusion.  A 32-French Blake  drain was placed through  a separate incision and directed inferiorly on  the diaphragmatic surface of the pericardium.  The wound then was  closed with #1 Vicryl fascial suture, 2-0 Vicryl subcutaneous suture,  and a 3-0 Vicryl subcuticular suture.  All sponge, needle, and  instrument counts were correct at the end of the procedure.  The patient  was taken from the operating room to the postanesthetic care unit and  extubated in good condition.      Salvatore Decent Dorris Fetch, M.D.  Electronically Signed     SCH/MEDQ  D:  03/06/2008  T:  03/07/2008  Job:  [id number]

## 2011-03-07 NOTE — Assessment & Plan Note (Signed)
[name] returns to clinic today for followup evaluation.  We have  recently had her using Dilaudid for breakthrough pain.  That was at her  request.  She now reports that she is not getting any relief at all from  the Dilaudid and would like to return to using the hydrocodone for  breakthrough pain.  She does need a refill on hydrocodone, but reports  that she also needs a refill on the morphine sulfate continues release  which she uses 4 times a day for pain relief on a long term basis.  She  is accompanied into the office today by her granddaughter.  The patient  herself was recently on prednisone and just completed that apparently  fluid build up around her heart secondary to a viral infection.  That  has improved and she is doing well overall in that regard.   REVIEW OF SYSTEMS:  Noncontributory.   MEDICATIONS:  1. Celexa 40 mg daily.  2. Premarin 0.625 mg daily.  3. Synthroid daily.  4. Allegra daily.  5. Amitriptyline 25 mg nightly.  6. Ambien 10 mg nightly.  7. Morphine sulfate continuous release 30 mg q.i.d.  8. Soma 350 mg t.i.d. p.r.n.  9. Flexeril 10 mg 2 tablets p.o. nightly.  10.Pravachol daily.  11.Xanax 0.5 mg t.i.d. p.r.n.  12.Dilaudid 4 mg 1 to 2 p.o. t.i.d. p.r.n.   PHYSICAL EXAMINATION:  GENERAL:  Well-appearing fit adult female in mild  acute discomfort.  VITAL SIGNS:  Blood pressure 115/62 with a pulse of 103, respiratory  rate 18, and O2 saturation 96% on room air.  She ambulates without any  assistive device.  She has 5-/5 strength throughout.   IMPRESSION:  1. Status post lumbar decompression/arthrodesis L3-L5 in March 2003      with chronic low back pain, status post hemilaminectomy and partial      facetectomy in April 1999.  2. Chronic lumbar spondylosis.  3. Cervical stenosis C5-C6 and C6-C7.  4. History of renal cell carcinoma with recent resection of the right      and prior resection of the left kidney tumor.  5. Status post rotator cuff  repair, right upper extremity.  6. Right total knee replacement in October 2007.  7. Bilateral breast reduction surgery.  8. History of fall with left wrist fracture.  9. History of melanoma resection.  10.Kyphoplasty.  11.History of ovarian cancer.   In the office today, we did switch the patient back to hydrocodone 5/325  one to two tablets p.o. q.i.d. p.r.n. for breakthrough pain.  We also  refilled her morphine sulfate continues release as of June 06, 2008.  She is to stop  Dilaudid completely at this point.  We will plan on seeing the patient  in follow up in approximately 3 months' time with refills prior to that  appointment as necessary.           ______________________________  [name], M.D.     DC/MedQ  D:  06/01/2008 10:20:53  T:  06/01/2008 23:21:41  Job #:  [id number]

## 2011-03-07 NOTE — Assessment & Plan Note (Signed)
PROCEDURE:  Left hip trochanteric bursa injection.   INDICATION:  Left hip trochanteric bursitis with pain only partially  responsive to medication management and other conservative care.  Informed consent was obtained after describing the risks and benefits of  the procedure with the patient.  These include bleeding, bruising, and  infection.  She elects to proceed and has given written consent.   The patient was placed in the right lateral decubitus position.  Area  was marked, prepped with Betadine and alcohol, entered with 25-gauge 1-  1/2-inch needle was inserted, the bone contact slightly withdrawn.  Solution containing 1 mL of 40 mg/mL Depo-Medrol with 4 mL of 1%  lidocaine injected.  The patient tolerated the procedure well.  Post-  injection instruction was given including some TFL stretching exercises.      Erick Colace, M.D.  Electronically Signed     AEK/MedQ  D:  03/16/2009 11:24:31  T:  03/17/2009 02:25:08  Job #:  [id number]

## 2011-03-07 NOTE — Procedures (Signed)
[name], [name]:  [id number]   MEDICAL RECORD NO.:  [id number]          PATIENT TYPE:  OUT   LOCATION:  XRAY                         FACILITY:  [facility]   PHYSICIAN:  Erick Colace, M.D.DATE OF BIRTH:  May 06, 1951   DATE OF PROCEDURE:  04/15/2009  DATE OF DISCHARGE:  03/16/2009                               OPERATIVE REPORT   This is a left carpal tunnel injection.   INDICATIONS:  Left median neuropathy through its demonstrated as  moderate on EMG study from last month.   Informed consent was obtained after describing the risks and benefits of  the procedure with the patient.  These include bleeding, bruising, and  infection.  She elects to proceed and has given written consent.  The  patient placed seated on chair with hand on exam table.  Area marked  prepped with Betadine alcohol.  A skin wheal was raised with 1 quarter  cc of 1% lidocaine using a 27-gauge 5-inch needle.  Then a separate 27-  gauge 5-inch needle was inserted angling distally.  Paresthesias were  elicited at depth of 1-1/2-inch.  Needle was withdrawn slightly and then  a solution of 1 quarter cc of 40 mg/mL Depo-Medrol was injected.  The  patient tolerated the procedure well.  Post injection instructions  given.      Erick Colace, M.D.  Electronically Signed     AEK/MEDQ  D:  04/15/2009 14:36:20  T:  04/16/2009 02:30:30  Job:  [id number]

## 2011-03-07 NOTE — Procedures (Signed)
[name], [name]:  [id number]   MEDICAL RECORD NO.:  [id number]           PATIENT TYPE:   LOCATION:                                 FACILITY:   PHYSICIAN:  Erick Colace, M.D.DATE OF BIRTH:  [birth year]   DATE OF PROCEDURE:  05/11/2009  DATE OF DISCHARGE:                               OPERATIVE REPORT   This is a left subacromial bursa injection.   INDICATION:  Left subacromial bursitis with left shoulder pain.   Impingement sign, positive left side.   Informed consent was obtained after describing risks and benefits of the  procedure with the patient.  These include bleeding, bruising, and  infection.  She elects to proceed and has given written consent.  The  patient placed in a seated position.  Area marked and prepped with  Betadine.  Posterolateral approach utilized.  A 25-gauge inch and half  needle was inserted to approximately 1 inch depth.  After negative  drawback for blood solution containing 1 mL of 40 mg/mL Depo-Medrol and  3 mL of 1% lidocaine injected.  The patient tolerated the procedure  well.  Pre and post injection vitals stable.  Post injection  instructions given.      Erick Colace, M.D.  Electronically Signed     AEK/MEDQ  D:  05/11/2009 09:25:29  T:  05/11/2009 23:53:40  Job:  [id number]

## 2011-03-07 NOTE — Assessment & Plan Note (Signed)
The patient is seen in follow-up today.  She reports that since her last  clinic visit on October 09, 2008, she underwent an MRI scan of her  lumbar spine dated November 06, 2007.  The impression was extensive  postoperative changes with old compression fracture at L1 with probable  vertebroplasty.  There was mild central and left paracentral protrusion  at L5-S1 without high grade canal or foraminal stenosis or nerve root  compression or displacement.   The patient reports that she subsequently had a blood test which showed  a low iron and low ferritin levels.  We had previously prescribed iron  for her.  She subsequently was referred to [name], M.D., a  local hematologist.  She does have a history of extensive cancer  including renal cell carcinoma and melanoma.  Dr. Arline Asp saw her once  and they are planning a follow-up.  Apparently they did an IV infusion  of iron which she reports gave her some benefit for a few days, but now  she has felt weak again.  There is a possibility that a future bone  marrow biopsy may be necessary according to the patient.   The patient does need a refill on her pain medicines in the office  today.   REVIEW OF SYSTEMS:  Noncontributory.   MEDICATIONS:  1. Celexa 40 mg daily.  2. Premarin 0.625 mg daily.  3. Synthroid daily.  4. Allegra daily.  5. Amitriptyline 25 mg q.h.s.  6. Ambien 10 mg q.h.s.  7. Morphine sulfate continuous release 30 mg q.i.d.  8. Soma 350 mg t.i.d. p.r.n.  9. Flexeril 10 mg two tablet p.o. q.h.s.  10.Pravachol daily.  11.Xanax 0.5 mg t.i.d. p.r.n.  12.Hydrocodone 5/325 one to two tablets p.o. q.i.d. p.r.n. (5-8 per      day).   PHYSICAL EXAMINATION:  GENERAL:  A well-appearing middle-age adult  female in mild acute discomfort.  VITAL SIGNS:  Not obtained in the office today.  She ambulates without  any assistive device.  She has 5-/5 strength throughout.   IMPRESSION:  1. Status post lumbar  decompression/arthrodesis L3-L5 March 2003.  2. Chronic low back pain status post hemilaminectomy and partial      facetectomy in April of 1990.  3. Chronic lumbar spondylosis.  4. Cervical stenosis at C5-6 and C6-7.  5. History of renal cell carcinoma with recent resection of the right      and prior resection on the left.  6. Status post rotator cuff tear of the right upper extremity in      October of 2006.  7. Prior right total knee replacement in October of 2007.  8. Breast reduction surgery bilaterally.  9. History of fall with left wrist fracture.  10.History of melanoma resection.  11.History of kyphoplasty.  12.History of ovarian cancer.   In the office today, we did refill the patient's hydrocodone along with  her Soma, Flexeril, Ambien, morphine sulfate, and Xanax.  We will plan  on seeing the patient in follow-up in this office in approximately 3-4  months time.  We did have a discussion regarding her present hematologic  problems, and the fact that Dr. Arline Asp may be suggesting a bone marrow  biopsy.  Hopefully the patient may be responsive to hormone therapy  including Aranesp given the fact that she has had renal cell carcinoma  in the past.   We will plan on seeing the patient in follow-up as noted above.  ______________________________  [name], M.D.     DC/MedQ  D:  02/03/2008 12:29:36  T:  02/03/2008 13:27:06  Job #:  [id number]

## 2011-03-07 NOTE — H&P (Signed)
[name], [name] NO.:  [id number]   MEDICAL RECORD NO.:  [id number]          PATIENT TYPE:  INP   LOCATION:  0002                         FACILITY:  [facility]   PHYSICIAN:  [name], M.D.  DATE OF BIRTH:  Apr 05, 1951   DATE OF ADMISSION:  09/09/2007  DATE OF DISCHARGE:                              HISTORY & PHYSICAL   ADMISSION DIAGNOSIS:  Right renal mass.   HISTORY OF PRESENT ILLNESS:  Mr. [name] is a 60 year old female.  She  has a history of renal cell carcinoma involving the left kidney and  underwent a partial nephrectomy about 3-1/2 to 4 years ago.  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  She has never had evidence of recurrence or metastatic  disease.  In the lower pole of her right kidney, she has had a  longstanding 2-3 cm category 1 cyst involving that lower pole.  If  anything, that had decreased in size.  The patient subsequently had a  repeat CT for followup of a borderline ovarian tumor, which showed a new  3-1/2 cm complex mass involving the lower pole of the right kidney.  It  appeared that this mass was adjacent to the previous benign cyst and  appears that this is a new renal cell carcinoma involving the lower pole  of her right kidney.  We certainly felt that this was a substantially  suspicious abnormality and felt that flank exploration with partial  nephrectomy and possible radical nephrectomy would be necessary to deal  with this.  The patient has had some microscopic hematuria but has had  no gross hematuria.  She has had no flank pain.  The patient has had no  other complaints or issues.   PAST MEDICAL HISTORY:  Notable mostly for some chronic musculoskeletal  back problems.  She has also had hyperlipidemia and significant  arthritis.  She has had a multitude of orthopedic surgeries and back  surgeries in the past.   CURRENT MEDICATIONS:  Her current medications include Soma,  Hydrocodone,  thyroid replacement, cyclobenzaprine, triamterene/hydrochlorothiazide,  Ambien, Celexa, Premarin, amitriptyline, Neurontin and some potassium  replacement.   ALLERGIES:  The patient has allergy to ampicillin.  She had a previous  tobacco use history but has not had any recently.   EXAMINATION:  GENERAL:  On exam, she is a moderately obese female in no  acute distress.  She is afebrile.  LUNGS:  Respiratory effort is normal.  Lungs clear to auscultation.  ABDOMEN:  Exam shows well-healed left flank incision.  No palpable  masses, obvious hernia.  EXTREMITIES:  Showed no edema.   DATA:  Hemoglobin is 11.8.  Creatinine is 0.8.   ASSESSMENT:  A 3-1/2-cm solid and cystic mass involving the lower pole  of the right kidney.  This is suspicious for a cystic renal cell  carcinoma.  She is to undergo right flank exploration with probable  right partial nephrectomy, possible right radical nephrectomy.  She will  be hopefully admitted for routine postoperative management and care.  ______________________________  [name], M.D.  Electronically Signed     DSG/MEDQ  D:  09/09/2007  T:  09/09/2007  Job:  [id number]

## 2011-03-07 NOTE — Discharge Summary (Signed)
NAMENEYLAN, [name]   MEDICAL RECORD NO.:  [id number]          PATIENT TYPE:  INP   LOCATION:  1445                         FACILITY:  [facility]   PHYSICIAN:  [name], M.D.  DATE OF BIRTH:  1951-03-21   DATE OF ADMISSION:  09/09/2007  DATE OF DISCHARGE:  09/12/2007                               DISCHARGE SUMMARY   DISCHARGE DIAGNOSES:  1. Renal cell carcinoma.  2. History of borderline ovarian cancer.  3. Depressive disorder.  4. History of prior renal malignancy.   PROCEDURE PERFORMED:  Right flank exploration with right partial  nephrectomy on September 09, 2007.   HOSPITAL COURSE:  [name] is a 60 year old female.  She had been  diagnosed with a small solid renal lesion 4 years ago and underwent  partial nephrectomy for a 2 cm clear cell renal carcinoma.  Margins were  negative and the patient never had any recurrence or evidence of  metastatic disease.  The patient also had a history of borderline  ovarian tumor.  She had had followup imaging over the years for both a  history of renal cell carcinoma as well as her borderline ovarian tumor.  She recently had a CT scan which showed a new 3.5 cm solid abnormality  in the lower pole of the right kidney.  She had been noted to have a  small category 1 cyst in the lower pole of the right kidney but clearly  there had been a change in her most recent CT scan.  For that reason it  was recommended that this abnormality be further investigated with  exploration.   On September 09, 2007, the patient underwent right flank exploration.  We  did isolate the vessels and her kidney was subjected to cold ischemia.  Mannitol had been given and the patient did have her kidney iced after  placement of bulldog clamps on the artery and vein.  The patient then  underwent partial nephrectomy of a lower pole right renal lesion.  Open  collecting system was closed with Vicryl suture.  Ischemia time was  15-  20 minutes.  Hemostasis was quite good.  Frozen sections revealed that  we did have a renal cell carcinoma with negative margins.  The surgery  itself was otherwise uneventful.  The patient's postoperative course was  also unremarkable other than some issues with pain control due to a  history of some chronic pain medication use.  She remained otherwise  quite stable.  She had good urinary output with minimal JP drainage.  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  Her hemoglobin stabilized at  just about 9.0.  Renal function remained normal.  The patient was  eventually discharged on September 12, 2007.  At that time she had had  evidence of bowel activity, was tolerating a diet well and was  ambulating.   DISPOSITION:  The patient was discharged to home.  She was kept on  current medications as per the medication rec form as well as pain  medication.  She will follow up in our office in approximately 1 week.           ______________________________  [name], M.D.  Electronically Signed     DSG/MEDQ  D:  11/04/2007  T:  11/04/2007  Job:  [id number]   cc:   Marcelino Duster L. Vincente Poli, M.D.  Fax: [phone]

## 2011-03-07 NOTE — Assessment & Plan Note (Signed)
Ms. Vandehei returns today.  She has a history of lumbar post laminectomy  syndrome as well as cervical stenosis.  She has a peroneal neuropathy as  well as shoulder pain, has responded well to subacromial injections in  the past.  She has chronic anxiety, had been on long-term Xanax, but was  taken off this.  She is on Celexa as well as BuSpar.  She likes her  Xanax better.  Her average pain is 8/10, basically all over the whole  body, mainly shoulders, left lower extremity and back, as well as neck  area.   She walks 15 minutes at a time.  She drives.  She needs assist with  certain household duties and shopping.  She does take care of an 8-year-  old granddaughter part of the day.   REVIEW OF SYSTEMS:  Positive for weakness, numbness, tingling,  depression, anxiety, but negative for suicidal thoughts.  Remainder of  review of systems is negative.   SOCIAL HISTORY:  Married, lives with her husband, nondrinker, nonsmoker.   PHYSICAL EXAMINATION:  Vital Signs:  Blood pressure 124/48, pulse 97,  respirations 18, O2 sat 95% on room air.  GENERAL:  Overweight female in no acute distress.  Orientation x3.  Affect alert.  Gait is normal.  She has positive impingement sign, left  shoulder.  Mood and affect are appropriate.  EXTREMITIES:  Without edema.   IMPRESSION:  1. Lumbar post laminectomy syndrome.  2. Cervical stenosis.  3. Left shoulder pain, question whether this is mainly related to      subacromial bursitis versus radicular pain.  We will inject      shoulder and monitor results.  If she improves from this, then we      will not pursue any further cervical spine workup.  If she does not      improve from this, may consider further workup.      Erick Colace, M.D.  Electronically Signed     AEK/MedQ  D:  05/11/2009 09:30:58  T:  05/12/2009 00:16:51  Job #:  [id number]

## 2011-03-07 NOTE — Assessment & Plan Note (Signed)
[name] returns to clinic today ahead of schedule after recent  hospitalizations.  She was hospitalized a total of 2 times recently.  The first one was Mar 03, 2008 through Mar 09, 2008, when she had  shortness of breath and was told that she had fluid on her lung, and  possibly cancer in the area behind her sternum.  A biopsy was  subsequently negative for cancer, but she had treatment for the fluid  around her heart and had improvement.  She subsequently was discharged  home and had chest pain and was sent for a chest x-ray to rule out a  collapsed lung.  That was not the case, but she was told she had  suffered a myocardial infarction.  Subsequent lab tests ruled out the  myocardial infarction, but she was started on aspirin 325 mg 6 tablets  per day by Dr. Charissa Bash in [facility].  The patient  contacted her primary care physician, Dr. Cleta Alberts, at Urgent Care.  They  cut her aspirin back to 4 tablets per day.   The patient did follow up with Dr. Dorris Fetch yesterday.  A chest x-ray  showed minimal fluid around her left lung and he released her at that  time.   During the hospitalizations, she was started on Dilaudid 4 mg 1 tablet  q.4 hours for pain.  She was off the hydrocodone and morphine sulfate  during the time that she was using Dilaudid.  She subsequently has ran  out of the Dilaudid and has restarted the hydrocodone and morphine, but  feels that overall, she got better relief using the Dilaudid.  She is  not sure she got enough relief with the Dilaudid used alone and she  would like to use something for long-acting relief.   The patient did follow up with Dr. Arline Asp recently.  She was told she  has anemia and they planned to review her medical records to see what  action will be taken.  She was told that her iron level was now within  normal limits.   REVIEW OF SYSTEMS:  Positive for shortness of breath.   MEDICATIONS:  1. Celexa 40 mg.  2. Premarin  0.625 mg daily.  3. Synthroid daily.  4. Allegra daily.  5. Amitriptyline 25 mg at bedtime.  6. Ambien 10 mg at bedtime.  7. Morphine sulfate continuous release 30 mg q.i.d.  8. Soma 350 mg t.i.d. p.r.n.  9. Flexeril 10 mg 2 tablets p.o. at bedtime.  10.Pravachol daily.  11.Xanax 0.5 mg t.i.d. p.r.n.  12.Hydrocodone 5/325mg  1-2 tablets p.o. q.i.d. p.r.n. (5-8 per day).   PHYSICAL EXAMINATION:  Well-appearing fit adult female in mild-to-  moderate acute discomfort.  Blood pressure 123/62 with pulse 96, respiratory rate 20, and O2  saturation 96% on room air.  She has 5-/5 strength throughout and ambulates without any assistive  device.   IMPRESSION:  1. Status post lumbar decompression/arthrodesis L3-L5, March 2003 with      chronic low back pain; status post hemilaminectomy and partial      facetectomy, April 1999.  2. Chronic lumbar spondylosis.  3. Cervical stenosis C5-C6 and C6-C7.  4. History of renal cell carcinoma with recent resection of right and      prior resection of left kidney tumor.  5. Status post rotator cuff repair, right upper extremity.  6. Right total knee replacement, October 2007.  7. Bilateral breast reduction surgery.  8. History of fall with left wrist fracture.  9. History of melanoma resection.  10.Kyphoplasty.  11.History of ovarian cancer.   In the office today, we decided to continue the Dilaudid at 4 mg 1-2  tablets p.o. t.i.d. p.r.n.  We have also decided to restart slightly  lower dose of the morphine sulfate continuous release for long-term pain  management.  She will be using the morphine sulfate continuous release  30 mg t.i.d. and using the Dilaudid only as absolutely needed for  breakthrough pain.  She is off the hydrocodone completely at this time,  but continues to take the Soma, Flexeril, and Ambien along with Xanax.   We will plan on seeing her in follow up in approximately 3 months' time  with refills prior to that appointment as  necessary.           ______________________________  [name], M.D.     DC/MedQ  D:  04/03/2008 09:48:59  T:  04/03/2008 22:42:57  Job #:  [id number]

## 2011-03-07 NOTE — Procedures (Signed)
[name], Tina NO.:  [id number]   MEDICAL RECORD NO.:  [id number]           PATIENT TYPE:   LOCATION:                                 FACILITY:   PHYSICIAN:  Erick Colace, M.D.DATE OF BIRTH:  [birth year]   DATE OF PROCEDURE:  DATE OF DISCHARGE:                               OPERATIVE REPORT   PROCEDURE:  Bilateral T12, L1, L2 medial branch blocks under  fluoroscopic guidance.   INDICATIONS:  Lumbar spondylosis, lumbar spondylolisthesis with history  of L3-L5 fusion.  Her pain is only partially responsive to medication  management, interferes with self-care and mobility.   DESCRIPTION OF PROCEDURE:  Informed consent was obtained after  describing risks and benefits of the procedure with the patient.  These  include bleeding, bruising, infection, temporary or permanent paralysis.  She elects to proceed and has given written consent.  The patient placed  prone on fluoroscopy table.  Betadine prep, sterile draped.  A 25 gauge  inch and half needle was used to anesthetize the skin and subcutaneous  tissue with 1% lidocaine x2 mL and a 22-gauge 3-1/2-inch spinal needle  was inserted under fluoroscopic guidance.  First starting, left L3 SAP  transverse process junction, bone contact made and confirmed with  lateral imaging.  Omnipaque 180 under live fluoro demonstrate no  intravascular uptake, 0.5 mL of solution containing 1 mL of 4 mg/mL  dexamethasone and 2 mL of 2% MPF lidocaine were injected.  Then the left  L2 SAP transverse process junction targeted, bone contact made,  confirmed with lateral imaging.  Omnipaque 180 x 0.5 mL demonstrate no  intravascular uptake, then 0.5 mL of dexamethasone lidocaine solution  was injected.  Then left L1 SAP transverse process junction targeted,  bone contact made, confirmed with lateral imaging.  Omnipaque 180 under  live fluoro demonstrated no intravascular uptake, then 0.5 mL of  dexamethasone lidocaine solution  injected.  The same procedure was  repeated on the right side using same needle, injectate, and technique.  The patient tolerated the procedure well.  Pre and post injection vitals  stable.  Post injection instructions were given.  Pre injection pain  level 8/10.  Post injection 7/10.  We will follow with pain diary.  If  she has no significant improvement, may need to get intra-articular  injections at L5-S1.   She is also complaining of left upper and left lower extremity tingling  and numbness.  We will set up with EMG NCV.   She also reports taking more pain medicines and prescribed.  We will  need to follow pill counts.      Erick Colace, M.D.  Electronically Signed     AEK/MEDQ  D:  02/25/2009 14:21:00  T:  02/26/2009 04:10:34  Job:  [id number]   cc:   Stefani Dama, M.D.  Fax: [phone]   Erasmo Leventhal, M.D.  Fax: [phone]

## 2011-03-07 NOTE — Assessment & Plan Note (Signed)
[name] returns to clinic today for followup evaluation.  She reports  that she is having problems with her insomnia related to Ambien.  She  does not feel that it is working as well as it had been previously.  She  does not feel that she wants to try Ambien CR at this point.  She is on  the max dose of Ambien at 10 mg q. day.  She has decided that she will  just continue it at the present time without any adjustments.   The patient was seen recently for upper posterior back pain and  shortness of breath.  She underwent an echocardiogram with followup with  cardiologist in 2 weeks.  She continues on fluid pills daily.   The patient reports that Dr. Danielle Dess recently told her that she has  herniated disk below her prior fusion level.  She has decided to avoid  any repeat surgery at this point.  She does need a refill on morphine  sulfate along with her hydrocodone, Flexeril, and amitriptyline in the  office today.   REVIEW OF SYSTEMS:  Positive for urinary retention.   MEDICATIONS:  1. Celexa 40 mg daily.  2. Premarin 0.625 daily.  3. Synthroid daily.  4. Allegra daily.  5. Amitriptyline 25 mg nightly.  6. Ambien 10 mg nightly.  7. Morphine sulfate continuous release 30 mg q.i.d.  8. Hydrocodone 5/325 one to two tablets p.o. q.i.d. p.r.n. (8 per      day).  9. Soma 350 mg t.i.d. p.r.n. (3 per day).  10.Flexeril 10 mg 2 tablets p.o. nightly.  11.Pravachol daily.  12.Xanax 0.5 mg t.i.d. p.r.n. (0-3 per day).   PHYSICAL EXAMINATION:  GENERAL:  A well-appearing fit adult female in  mild-to-moderate acute discomfort.  Vitals were not obtained.  She  ambulates without any assistive device.  She has 5-/5 strength  throughout the bilateral upper and lower extremities.   IMPRESSION:  1. Status post lumbar decompression/arthrodesis at L3-L5 in March 2003      with chronic lower back pain.  2. Status post hemilaminectomy and partial facetectomy, April 1990.  3. Chronic lumbar  spondylosis.  4. Cervical stenosis at C5-C6 and C6-C7.  5. History of renal cell carcinoma with prior resection of right      kidney and prior resection of the left kidney tumor.  6. Status post rotator cuff repair, right upper extremities.  7. Right total knee replacement, October 2007.  8. Bilateral breast reduction surgery.  9. History of fall and left wrist fracture.  10.History of melanoma.  11.Kyphoplasty.  12.History of ovarian cancer.   In the office today, we did refill the patient's hydrocodone along with  her morphine sulfate, Flexeril, and amitriptyline.  She has sufficient  supply of Ambien at this time.  We have decided not to make a change  there.  We plan on seeing her in followup in approximately 3 months'  time.           ______________________________  [name], M.D.     DC/MedQ  D:  10/28/2008 09:51:34  T:  10/29/2008 00:15:54  Job #:  [id number]

## 2011-03-07 NOTE — Assessment & Plan Note (Signed)
OFFICE VISIT   [name], [name]  DOB:  1951/04/04                                        Mikayah 11, 2009  CHART #:  [id number]   HISTORY:  This patient is a 60 year old female who recently underwent a  subxiphoid pericardial window for a pericardial effusion on 03/06/2008  by Dr. Dorris Fetch.  She returns on today's date for office visit  followup.  She was recently hospitalized on  03/23/2008 to 03/24/2008  for presentation of chest pain where she ruled out for myocardial  infarction.  She has chronic pain issues.  She is treated in the Pain  Clinic.  She also has history of multiple malignancies including renal  cell carcinoma, ovarian cancer, as well as melanoma.  Currently, she  reports that she still continues to have some pain issues related to the  left side of her chest.  These have improved.  She denies significant  symptoms of shortness of breath, cough, or other constitutional  symptoms.   Chest x-ray was reviewed on today's date  and reveals only very tiny  left-sided effusion.  There are no other new abnormalities.   PHYSICAL EXAMINATION:  VITAL SIGNS:  Blood pressure is 120/83, pulse 88,  respirations 18, and oxygen saturation is 98% on room air.  GENERAL:  Adult female in no acute distress.  PULMONARY:  Reveals clear breath sounds bilaterally.  CARDIAC:  Regular rate and rhythm.  No murmurs or rubs.  Incision  inspected  and is healing well without signs of infection.   ASSESSMENT:  This patient is doing well following her pericardial window  from a cardiothoracic surgical viewpoint  Her pathology did reveal mild  inflammation.  The pericardial fluid revealed atypical cells that favor  atypical mesothelial cells.  She is encouraged to continue her follow up  with her cardiologist as scheduled for further evaluation of her chest  pain.  Additionally, she is encouraged to continue seeing her oncologist  and primary physician for  further  evaluation of these ongoing symptoms.  From our viewpoint, we do  not need to see her, but we will be happy to see her on a p.r.n. basis,  if we can be of any further assistance.   Rowe Clack, P.A.-C.   Sherryll Burger  D:  04/02/2008  T:  04/03/2008  Job:  [id number]   cc:   Brett Canales A. Cleta Alberts, M.D.

## 2011-03-07 NOTE — Discharge Summary (Signed)
[name], [name]   MEDICAL RECORD NO.:  [id number]          PATIENT TYPE:  INP   LOCATION:  2033                         FACILITY:  [facility]   PHYSICIAN:  Leighton Roach McDiarmid, M.D.DATE OF BIRTH:  February 13, 1951   DATE OF ADMISSION:  03/03/2008  DATE OF DISCHARGE:  03/09/2008                               DISCHARGE SUMMARY   PRIMARY CARE Kayode Petion:  Brett Canales A. Cleta Alberts, MD at [facility].   CONSULTANTS:  Salvatore Decent. Dorris Fetch, MD with [facility].   PROCEDURES:  Pericardial window.  Please see dictation for complete  details of that procedure.  In short, pericardial window was performed  and approximately 100 mL of bloody fluid was evacuated, which was sent  for cytology culture, cell count, chemistries, and serologies.  This  procedure was performed on Mar 06, 2008.  A drain was placed, which was  removed on Mar 08, 2008.   REASON FOR ADMISSION:  The patient is a 60 year old with a strong  history of cancer including a renal cell cancer status partial left and  right nephrectomies and a history of ovarian cancer.  She presented to  the emergency department with 2-3 days of continuous chest pain,  nonexertional and not improved by nitro paste.  An echocardiogram showed  of pericardial effusion and an EKG showed normal sinus rhythm but low-  voltage QRS.   DISCHARGE DIAGNOSES:  1. Pericardial effusion.  2. History of renal cell cancer.  3. History of ovarian cancer.  4. History of melanoma.  5. Hyperlipidemia.  6. History of severe back pain with cervical stenosis, status post      multiple surgeries.   LABS AND STUDIES:  On admission, the patient had electrolytes that were  within normal limits with the exception of a potassium of 3.1.  Of note,  her creatinine was 0.9.  CBC showed a white blood cell count of 10.6,  hemoglobin of 12.1, and platelets of 176.  A chest CT showed no  pulmonary embolism and normal aorta, a small pleural effusion with  bibasilar atelectasis, two radiodense objects in the posterior  mediastinum, and five and most importantly pericardial thickening versus  effusion.  As mentioned previously, an EKG showed normal sinus rhythm,  low-voltage QRS.  A chest x-ray showed cardiomegaly with bibasilar  atelectasis or infiltrate and small bilateral effusion.  The patient had  cardiac enzymes cycle those were negative.  An echocardiogram was done  that showed overall left ventricular ejection fraction normal with an EF  of between 60 and 65%, no wall motion abnormalities, left ventricular  wall thickness was noted to be the upper limits of normal, mildly  dilated left atrium, small-to-moderate free flowing circumferential  pericardial effusion with the largest component being posterior to the  heart, and no signs of tamponade.  Number of the labs were done  including HIV test, which was negative.  CRP, which was elevated at  194.4.  An ESR, which was elevated at 64.  A TSH, which was normal at  2.012.  A CT abdomen and pelvis done to look for  signs of metastatic  disease given the patient's history of cancer showed a 1.5-cm cyst on  the left lower pole of her kidney that was stable prior to past CTs, but  should have followup in 6 months with a CT or preferably an MRI with and  without contrast.  CT of the pelvis was normal.  An ANA was negative.  Culture of the pericardial effusion was no growth at 1 day.  A number of  tests were run on the pericardial fluid including an LDH, which was 1571  and amylase, which was 46.  Protein 3.8, and glucose 72.  Cell count of  the pericardial fluid showed 317 white blood cells, 46 segmented  neutrophils, 47 lymphocytes, 2 eosinophils, and 5 monocytes.  Other  cells included mesos.  Cytology of the pericardial fluid showed signs of  inflammation but no signs of malignancy.  Other tissue biopsies are  pending.   DISCHARGE MEDICATIONS:  The patient was discharged on the following   medicines:  1. Celexa 40 mg daily.  2. Synthroid 100 mcg daily.  3. Amitriptyline 25 mg at bedtime.  4. Ambien 10 mg at bedtime p.r.n. for sleep.  5. MS Contin 30 mg four times a day p.r.n. pain.  6. Soma 350 mg t.i.d. p.r.n.  7. Flexeril 20 mg at bedtime.  8. Xanax 0.5 mg t.i.d. p.r.n. anxiety.  9. Vicodin 5/325 mg 1-2 tablets four times a day as needed for pain.  10.Dilaudid 4 mg 1 tablet every 4 hours as needed for pain.  The other      pain medicines listed above were medicine she was taking upon      admission.  11.Aspirin 325 mg 2 tablets every 6 hours as needed for pain.      Dilaudid and aspirin are the only new pain medicines we are sending      her home on.  12.Premarin 0.65 mg daily.   HOSPITAL COURSE:  1. Chest pain.  The patient's history was consistent with pleuritic      chest pain.  Her EKG initially had a high suspicion for PE given      the pleuritic nature, tachycardia, temperature of 100 on exam, and      her history of cancer, but a chest CT showed no signs of pulmonary      embolism.  Her EKG did show some signs of a possible pericardial      effusion including the low QRS voltage and tachycardia.  Cardiac      enzymes were cycled and those were negative x3 and her EKG did not      show signs of acute coronary syndrome.  However, as mentioned      previously, an echocardiogram did show a pericardial effusion.      [facility] was consulted and a pericardial window was done.  Because of      this patient's strong history of cancer, there was significant      concern that perhaps this was a malignant effusion.  At the time of      discharge, most of her pathology is pending and the initial      cytology results did not show any signs of malignancy only of      inflammation.  The pericardial window was performed on Mar 06, 2008.  A drain was placed by [facility].  These were sent for pathology.      The drain was pulled on  the 17th and the patient's pain from this       procedure has been controlled primarily with Dilaudid and also some      scheduled aspirin.  I talked with the patient's pain clinic and      spoke with the nurse and let them know that we would likely send      her home on p.o. Dilaudid about 3 weeks worth with no refills.      This is acceptable to them and they are aware of this change.  The      patient is aware that she should call the pain clinic if she needs      any further adjustment to her pain medicines.  The patient is to      follow up with Dr. Cleta Alberts, her primary care Kyshon Tolliver who can follow      up the results of the pathology.  Please cc any copies of pathology      results to Dr. Cleta Alberts and he can do any referrals if there are any      signs of malignancy on the pathology.  2. Back pain.  The patient was continued on her extensive home regimen      of pain medicine.  3. Cancer risk.  The patient does have a strong personal history of      cancer as well as the family history.  Dr. Cleta Alberts may want to      consider at some point referring her for genetic testing if this      has not already been done for both her and her family members as      appropriate.  Followup chest x-ray after the pericardial window      showed a small right pleural effusion with suspected bibasilar      atelectasis, left greater than right but no pneumothorax.  At the      time of discharge, the patient's pain is controlled relatively      well.  Again, we will send her home with several weeks of Dilaudid      in addition to her home pain regimen.  The patient's condition at      the time of discharge is stable.  She certainly needs to follow up      on the pathology or certainly needs to follow up on her pathology      results.  Pending test results at the time of discharge as      mentioned previously pathology for biopsies done during the      pericardial window.   DISPOSITION:  The patient is discharged to home.   DISCHARGE FOLLOWUP:  The patient  is to follow up with Dr. Lady Deutscher at  [facility] on Mar 17, 2008, at 10:45 a.m.  She is also to call  Dr. Dorris Fetch, cardiovascular surgeon for an appointment in 3 weeks  after discharge, phone number [phone].  Follow up issues and primary  issues, follow up of her pathology from her pericardial window.      Asher Muir, MD  Electronically Signed      Leighton Roach McDiarmid, M.D.  Electronically Signed    SO/MEDQ  D:  03/09/2008  T:  03/10/2008  Job:  [id number]   cc:   Brett Canales A. Cleta Alberts, M.D.  Salvatore Decent Dorris Fetch, M.D.  [facility]  [name], M.D.

## 2011-03-07 NOTE — Consult Note (Signed)
[name], [name]:  [id number]   MEDICAL RECORD NO.:  [id number]          PATIENT TYPE:  INP   LOCATION:  2033                         FACILITY:  [facility]   PHYSICIAN:  Salvatore Decent. Dorris Fetch, M.D.DATE OF BIRTH:  Aug 03, 1951   DATE OF CONSULTATION:  03/05/2008  DATE OF DISCHARGE:                                 CONSULTATION   [name] is a 60 year old woman with a history of renal cell ovarian  cancers and melanoma.  She presented on Mar 03, 2008, with a 2-3 day  history of continuous chest pain.  This was substernal and was not  nonexertional.  She was treated with nitropaste in the emergency room  without relief.  She did have a pleuritic component.  The chest pain was  worse with deep inspiration and supine position.  She did have shortness  of breath and was also tachycardiac on admission.  She had a normal  stress test about 6 months prior to admission.  She had her chest CT to  rule out pulmonary embolus.  There was no evidence of pulmonary embolus.  There were small bilateral effusions and there also was mild-to-moderate  pericardial effusion, as well as some pericardial thickening.  A 2-D  echocardiogram was performed which confirmed mild-to-moderate  pericardial effusion.  There was no significant valvular pathology.  There was preserved left ventricular function.  There was no evidence of  tamponade.   The patient has continued to have pain.  Her tachycardia has improved  since admission.   Her past medical history is significant for  1. Renal cell carcinoma status post partial resection of both left and      right kidneys.  2. 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  3. Back pain with previous lumbar decompression, previous      hemilaminectomy, as well as injections to cervical vertebrae for      her fracture, chronic pain.  4. Prior breast reduction.  5. Prior right total knee replacement.   MEDICATIONS ON ADMISSION:  1.  Vicodin 5/325 one tablet q.i.d. p.r.n.  2. Xanax 0.5 mg t.i.d. p.r.n.  3. Flexeril 20 mg nightly.  4. Soma 350 mg t.i.d. p.r.n.  5. Celexa 40 mg daily.  6. Premarin 0.625 mg daily.  7. Synthroid 100 mcg daily.  8. Amitriptyline 25 mg p.o. nightly.  9. Ambien 10 mg p.o. nightly.  10.MS Contin 30 mg p.o. q.i.d.   ALLERGIES:  She has an allergy to AMOXICILLIN which causes rash and  itching.   FAMILY HISTORY:  Significant for father having lung cancer and mother  having stomach cancer.   SOCIAL HISTORY:  Denies any alcohol, tobacco or nonprescription drug  use.  She is on disability.  She lives with her husband.   REVIEW OF SYSTEMS:  Chronic pain is noted, shortness of breath which is  improved since admission, chest pain which has persisted and required  intravenous narcotics since admission and no recent exposures.  She does  have a low-grade fever on admission, no chills or night sweats.  All  other systems are  negative.   PHYSICAL EXAMINATION:  [name] is a 60 year old woman.  She is  overweight.  She is in moderate discomfort.  She is well-developed and  well-nourished.  Neurologically, she is alert and oriented x3 with no  focal deficits.  HEENT exam is unremarkable.  Neck is supple without thyromegaly or adenopathy.  Cardiac exam has regular rate and rhythm.  There is no rub or murmur.  Lungs are clear.  ABDOMEN:  Soft, nontender with well-healed scars.  Extremities are without clubbing, cyanosis or edema.  Skin is warm and dry.   LABORATORY DATA:  White count is 5.5, hematocrit 29, platelets 154.  Sodium 142, potassium 3.1, BUN and creatinine 11 and 0.6, glucose 129,  albumin is 2.3.  LFTs within normal limits.  CK-MBs and troponins  negative x3.  ANA is pending.  HIV is pending.  TSH is pending.  CRP is  markedly elevated at 194.  Sed rate is elevated at 64.   IMPRESSION:  [name] is a 60 year old woman who presents with new  onset chest pain.  Workup has shown  no evidence of a primary cardiac  source and no evidence of pulmonary embolus.  She has been found to have  a mild-to-moderate pericardial effusion and pericardial thickening  raising concern her case for malignancy given her previous history of  renal cell cancer, ovarian cancer and melanoma.  There also is a  possibility this could represent pericarditis from an inflammatory or  infectious source, although given her normal white count and relatively  low-grade temperature, the infectious source is unlikely.   I have offered [name] two options, one would be continued  observation of her pericardial effusion with a repeat 2-D echocardiogram  and apparent treatment for pericarditis.  In her case, given her history  of multiple malignancies, I think she will be  better off with  subxiphoid pericardial window for definitive diagnostic and initial  therapy.  I discussed in detail with her the indications, risks,  benefits and alternatives.  She understands that there is no urgent  indication for surgery but it does offer the best chance of a definitive  diagnosis.  She understands that general conduct of the procedure are  need for general anesthesia and expected hospital stay.  She understands  the risk of the  procedure include but not limited to death, pulmonary embolus, cardiac  injury, bleeding, infection, as well as possibility of a recurrent  effusion.  She understands and accepts these risks and agrees to  proceed.  Will plan to proceed with surgery tomorrow, second case.      Salvatore Decent Dorris Fetch, M.D.  Electronically Signed     SCH/MEDQ  D:  03/05/2008  T:  03/06/2008  Job:  [id number]   cc:   [name], M.D.  Dr. Shari Prows, M.D.  Dr. Guadlupe Spanish A. Mayford Knife, M.D.

## 2011-03-08 ENCOUNTER — Other Ambulatory Visit: Payer: 59

## 2011-03-09 ENCOUNTER — Ambulatory Visit
Admission: RE | Admit: 2011-03-09 | Discharge: 2011-03-09 | Disposition: A | Discharge status: Home / Self Care | Payer: Medicare Other | Source: Ambulatory Visit | Attending: Neurological Surgery | Admitting: Neurological Surgery

## 2011-03-09 ENCOUNTER — Other Ambulatory Visit: Payer: 59

## 2011-03-09 DIAGNOSIS — M48061 Spinal stenosis, lumbar region without neurogenic claudication: Secondary | ICD-10-CM

## 2011-03-10 NOTE — Discharge Summary (Signed)
[place]  Patient:    [name], [name] Visit Number: [id number] MRN: [id number]          Service Type: SUR Location: [id number] Attending Physician:  Jonne Ply Dictated by:   Payton Doughty, M.D. 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  OPERATIVE PROCEDURE:  L3-4 and L4-5 laminectomy, discectomy, posterior lumbar body interbody fusion with posterior arthrodesis.  ATTENDING:  Stefani Dama, M.D.  COMPLICATIONS:  None.  DISCHARGE STATUS:  Alive and well.  HISTORY OF PRESENT ILLNESS:  This is a 60 year old right-handed white female whose history and physical was recounted in the chart.  Dr. Danielle Dess had been visiting with her for a few months.  The 4-5 showed continued progression as well as 3-4.  She was therefore admitted for decompression and fusion.  PAST MEDICAL HISTORY:  This is remarkable for good general health.  She has had tonsillectomy, hip operation twice, an appendectomy and hysterectomy, back surgery in 1984, and shoulder operation in 1999 and 2000, knee operations in 90 and 92.  MEDICATIONS:  Synthroid, Celexa, Lipitor, Allegra and Premarin, Vioxx, hydrocodone and Flexeril.  PHYSICAL EXAMINATION:  General exam was unremarkable.  Neurologic exam showed she was awake, alert and oriented.  She had intact strength save for the tibialis anterior on the left which was 4/5.  She had some lumbosacral tenderness.  HOSPITAL COURSE:  She was admitted after ascertainment of normal laboratory values and underwent a 3-4, 4-5 laminectomy, discectomy and pedicle fixation. Postoperatively, she did well.  The patient was on a PCA for a couple of days and then that was stopped. She has been up and about walking. She has difficulty with sleeping and  she would be started on Ambien for that.  On exam, her lower extremity strength is full.  She is being discharged home at this time, having completed physical therapy, facile with a walker.  FOLLOW-UP:  This will be in the [facility] office in about a week for follow-up with Dr. Danielle Dess.  It was noted that she did have a vaginal yeast infection as well as probable UTI.  She was started on Nystatin vaginal suppositories for that and ciprofloxacin for the urinary tract infection.  She will continue these medications at home as well as the Percocet for pain. Dictated by:   Payton Doughty, M.D. Attending Physician:  Jonne Ply DD:  01/11/02 TD:  01/13/02 Job: [id number]

## 2011-03-10 NOTE — Op Note (Signed)
[name], [name]:  [id number]   MEDICAL RECORD NO.:  [id number]          PATIENT TYPE:  INP   LOCATION:  0375                         FACILITY:  [facility]   PHYSICIAN:  De Blanch, M.D.DATE OF BIRTH:  1951/05/05   DATE OF PROCEDURE:  07/12/2004  DATE OF DISCHARGE:                                 OPERATIVE REPORT   PREOPERATIVE DIAGNOSES:  1.  Ovarian cyst.  2.  Pelvic pain.   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   SURGEON:  De Blanch, M.D.   ASSISTANT:  Stann Mainland. Vincente Poli, M.D.   ANESTHESIA:  General with orotracheal tube.   ESTIMATED BLOOD LOSS:  100 mL.   SURGICAL FINDINGS:  I was called to the operating room to assist Dr. Vincente Poli,  who had previously removed the patient's right tube and ovary and a cystic  mass from the left pelvic sidewall.  Interpretation on frozen section was  that the cystic mass, which had measured approximately 3 cm in diameter, was  a low-grade peritoneal tumor.  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  The patient had a past history of renal carcinoma.  Exploration of the upper abdomen was initially performed by laparoscopy,  visualizing the liver capsule and diaphragms.  At the time of laparotomy,  the upper abdomen was palpated and no metastases were noted.  The small and  large bowel appeared normal except for some adhesions from prior pelvic  surgery.  The omentum appeared normal.  The appendix was surgically absent.  There were a number of peritoneal implants on the left side of the pelvis,  the largest measuring approximately 2 cm in diameter, with the smallest  being 5 mm in diameter.  These involved the pelvic sidewall and vaginal cuff  peritoneum.   At the completion of the surgical procedure, there was no gross residual  disease.   Upon entering the operating room, we removed the O'Connor-O'Sullivan  retractor and extended the previously-placed midline incision.  A Bookwalter  retractor was assembled.  The upper abdomen and pelvis were explored with  the above-noted findings.  In addition to the above-noted findings, the  lymph nodes were palpated and were normal.   The bowel was packed out of the pelvis.  The left pelvic peritoneum was  incised and the retroperitoneal space opened.  Peritoneal implants were  implanted along the left pelvic sidewall and in order to resect these  implants, a ureterolysis was required.  The ureter was mobilized from its  attachments to the peritoneum down to the level where the uterine artery  crossed it.  The uterine artery was then doubly clipped and divided and the  ureter further mobilized to the bladder.  The peritoneum on the pelvic  sidewall was then excised, incorporating all of the peritoneal implants.  We  extended the peritoneal incision along the bladder flap and the vaginal  cuff.  At this juncture the vaginal cuff was opened and grasped.  Vaginal  veins were  clamped and suture ligated.  The peritoneum was further excised  and then submitted to frozen section.  Hemostasis was achieved in the pelvis  using cautery.  The vaginal cuff was closed with interrupted figure-of-eight  sutures of 0 Vicryl.   Attention was turned to the upper abdomen.  Because of the uncertain  histologic nature of the peritoneal implants and because of the possibility  of a malignancy, omentectomy was performed, removing the omentum from the  transverse colon.  Vascular pedicles were clamped and free-tied using 2-0  Vicryl.  The pelvis was irrigated and found to be hemostatic.  Packs and  retractors were removed.  The anterior abdominal wall was closed in layers,  the first being a running mass closure using #1 PDS, subcutaneous tissue was  irrigated, hemostasis achieved.  The skin was closed with  skin staples and  the laparoscopy site was also closed.  The patient was awakened from  anesthesia and taken to the recovery room in satisfactory condition.  Sponge, needle, and instrument counts correct x2.     Dani   DC/MEDQ  D:  07/12/2004  T:  07/13/2004  Job:  [id number]   cc:   Marcelino Duster L. Vincente Poli, M.D.  [address]  [place]  [place] [address]  Fax: [phone]   Telford Nab, R.N.  [phone] [address]  [place], [place] [address]

## 2011-03-10 NOTE — Op Note (Signed)
NAME:  [name], [name]                           ACCOUNT NO.:  [id number]   MEDICAL RECORD NO.:  [id number]                   PATIENT TYPE:  INP   LOCATION:  1610                                 FACILITY:  [facility]   PHYSICIAN:  [name], M.D.               DATE OF BIRTH:  August 28, 1951   DATE OF PROCEDURE:  01/18/2004  DATE OF DISCHARGE:                                 OPERATIVE REPORT   PREOPERATIVE DIAGNOSIS:  Left renal mass.   POSTOPERATIVE DIAGNOSIS:  Left renal mass.   OPERATION/PROCEDURE:  Flank exploration on the left with left partial  nephrectomy.   SURGEON:  [name], M.D.   ASSISTANT:  Thyra Breed, M.D.   ANESTHESIA:  General endotracheal anesthesia.   INDICATIONS:  Mrs. [name] is a 60 year old female.  As part of an evaluation  for some nonspecific left lower quadrant pain and microhematuria, she had a  contrast CT scan of her abdomen and pelvis.  This showed an enhancing solid  lesion off the lower pole of her left kidney measuring approximately 2.2 cm.  Mrs. [name] and her family were told that this has about an 80% chance of  being a malignancy.  We recommended against fine-needle aspiration for  several reasons.  We recommended flank exploration with probably partial  nephrectomy.  She understood that there was the potential for radical  nephrectomy if margins could not be obtained or significant bleeding or  other complications occurred.  The patient appeared to understand the  advantages and disadvantages of surgery as well as all the potential risks.  Full and informed consent was obtained.   DESCRIPTION OF PROCEDURE:  The patient was brought to the operating room  where she had successful induction of general endotracheal anesthesia.  A  Foley catheter was placed.  She was then placed in the standard flank  position.  Compression stockings were used for the lower extremities.  Great  care was taken in padding all the extremities and an axillary roll  was  utilized.  Kidney rest was utilized and the table was flexed.  She was then  prepped and draped in the usual manner.  Incision was made off the tip of  the 12th rib.  A rib was not taken and we essentially were underneath the  majority of the 11th rib.  Once the retroperitoneal space was entered, the  lower pole of the kidney was palpable.  We were able to use self-retaining  Omni retractor.  The anterior surface of Gerota's fascia was dissected free  from the colon.  Once we had the lower pole of the kidney isolated, we  opened Gerota's fascia in the inferior aspect of the kidney.  A 2-2.5 cm  solid mass was palpable right off the inferior pole laterally.  Gerota's  fascia was opened completely on the lower half of the kidney, leaving the  fat adherent to the  actual tumor.  Once we had complete mobilization of the  lower pole, we felt that vascular occlusion was not necessary given the  location of the tumor and we felt that compression of the parenchyma would  be adequate or any hemostatic control.  We clearly demarcated the area of  normal parenchyma and then used an electrocautery unit to score the renal  capsule.  A blunt and sharp dissection technique was then used to excise the  tumor with surrounding rim of parenchyma.  The tumor was sent for frozen  section.  We had a normal margin of parenchyma.  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  Flow seal was used on the kidney defect in addition to the argon  beam photocoagulator.  Some Surgicel and Gelfoam were then packed and  pressure was held for five minutes.  At the completion of his, no obvious  bleeding occurred.  The resection site appeared quite dry.  Utilizing some  interrupted Vicryl suture, the Gerota's fascia was reapproximated over the  defect, completing encapsulating the kidney.  We saw no evidence of any  collecting system injury and did not feel  a drain was required.  The flank  was copiously irrigated.  We then closed the incision anatomically with  three layers anteriorly and two posteriorly with #1 PDS suture.  Skin was  closed with clips.  Sponge and needle counts were correct.  The patient  appeared to tolerate the procedure well.  There were no obvious  complications.                                               [name], M.D.    DSG/MEDQ  D:  01/18/2004  T:  01/18/2004  Job:  [id number]

## 2011-03-10 NOTE — Assessment & Plan Note (Signed)
[name] returns to the clinic today for follow-up evaluation.  She  underwent a right total knee replacement August 09, 2006 at [facility] performed by Dr. Lloyd Huger.  We had seen her in consult for pain  control at that time.  She had previously given her 30 tablets of continuous  release morphine as of August 21, 2006.  The patient reports that Dr. Lloyd Huger gave her 30 more tablets of the continuous release when he discharged  her and also 30 mg of immediate release 15 mg strength.  The patient reports  that she is out of all medicine at this time.  That essentially results in  her having taken 130 of the continuous release 30 mg tablets over the past  41 days.  She reports that even on that regimen she has not had complete  relief.  She has been very aggressive with her therapy and has range of  motion of her right knee up to 108 degrees with home health therapy.  She is  due to follow up with Dr. Lloyd Huger next Thursday, a week from today and  reports that she will probably have one session of outpatient therapy.  She  has sufficient supply of Soma and Flexeril at this point.  She seems to also  have been told that she has soft bones and is due for a bone study and they  possibly start of osteoporotic medication.   MEDICATION:  1. Celexa 40 mg daily.  2. Premarin 0.625 mg daily.  3. Synthroid 100 mcg q.day.  4. Allegra daily.  5. Amitriptyline 25 mg q.h.s.  6. Ambien 10 mg q.h.s.  7. Morphine sulfate continuous release 30 mg t.i.d. (out at present).  8. Morphine sulfate immediate release 15 mg p.r.n. (out at present).  9. Soma 350 mg t.i.d.  10.Flexeril 10 mg two tablets q.h.s.  11.Pravachol __________.  12.Xanax 0.5 mg t.i.d. p.r.n.   REVIEW OF SYSTEMS:  Positive for night sweats.   PHYSICAL EXAMINATION:  GENERAL:  Well-appearing, slightly overweight adult  female in mild acute discomfort.  VITAL SIGNS:  BP 140/53 with a pulse of 103, respirations 16  and O2  saturation 99% on room air.  She has __________  strength with the bilateral  upper and lower extremities.  She ambulates without any assistive device.  MUSCULOSKELETAL:  Examination of her right knee shows that it has healed  well with no drainage noted.   IMPRESSION:  1. Status post lumbar decompression/arthrodesis at L3-L5 March 2003.  2. Chronic low back pain status post hemilaminectomy and partial      facetectomy April 1999.  3. Chronic lumbar spondylosis.  4. Left cervical stenosis C5-C6 and C6-C7 with __________  history of      renal cell carcinoma.  5. Status post rotator cuff surgery on the right upper extremity October      2006.  6. Status post right total knee replacement August 09, 2006.  7. Status post breast reduction surgery bilaterally.  8. Status post fall with left wrist fracture.   In the office today we did refill the patient's morphine sulfate continuous  release taken 30 mg q.day, no immediately release medication will be written  for.  This is slightly increased up from her t.i.d. dosing that we have been  using previously, but she has undergone recent surgery and still had some  persistent pain with good improvement in the  range of motion and walking abilities.  We  will plan on seeing the patient  in follow-up in this office in approximately 3 months' time with refills  prior to that appointment if necessary.           ______________________________  [name], M.D.     DC/MedQ  D:  09/06/2006 09:51:12  T:  09/06/2006 10:20:35  Job #:  [id number]

## 2011-03-10 NOTE — Discharge Summary (Signed)
NAME:  [name], [name]                           ACCOUNT NO.:  [id number]   MEDICAL RECORD NO.:  [id number]                   PATIENT TYPE:  INP   LOCATION:  1610                                 FACILITY:  [facility]   PHYSICIAN:  [name], M.D.               DATE OF BIRTH:  Nov 30, 1950   DATE OF ADMISSION:  01/18/2004  DATE OF DISCHARGE:  01/22/2004                                 DISCHARGE SUMMARY   DIAGNOSIS:  Left kidney mass.   PROCEDURE:  Left partial nephrectomy, performed January 18, 2004.   DISCHARGE MEDICATIONS:  The patient may resume home medications. In addition  the patient is sent home with p.o. Percocet p.r.n. pain.   DISCHARGE DIET:  Regular.   ACTIVITY LEVEL:  The patient should avoid bending, lifting, driving, or  moving heavy objects.   FOLLOWUP:  The patient will be contacted with a date for follow-up  appointment.   HISTORY OF PRESENT ILLNESS:  The patient is a 60 year old female who has a  long history of voiding dysfunction and was recently noted to have  microhematuria. The patient underwent a contrasted CT scan to evaluate her  upper tracts secondary to her microhematuria as well as her flank pain and  this demonstrated a 2.2 cm exophytic solid renal mass which was enhancing  coming off the lower pole of the left kidney.  The patient presents for left  partial nephrectomy.   HOSPITAL COURSE:  [name] was admitted to [facility] on January 18, 2004, and taken to the operating room at which time she underwent a left  partial nephrectomy. The patient tolerated the procedure well and  postoperatively was transferred to the PACU in stable condition. For a  detailed description of the operation please see the typed operative note on  the chart.  The patient did well postoperatively and by postoperative day  one remained afebrile and hemodynamically stable with good urine output. The  patient's hemoglobin on postoperative day one was 11.0 and her  creatinine  was 0.7.  the patient complained of flank pain, however this appeared to be  fairly well controlled on a Dilaudid PCA.  The patient however had  experienced no flatus and was subsequently given a Dulcolax suppository.  In  the addition, the patient's diet was advanced as tolerated and she was  encouraged to at least get out of the bed to a chair. The patient continued  to do well and by postoperative day two had had a loose bowel movement, and  remained stable. The patient's Foley catheter was subsequently removed and  she was able to ambulate in the hallway without difficulty. On postoperative  day three, the patient had experienced a low-grade fever up to 100.6, but  was otherwise feeling well with no major complaints. She was encouraged to  ambulate and to use an incentive spirometer to enhance deep breathing  exercises.  The patient was also changed from a Dilaudid PCA to p.o. pain  medication. The patient was watched the final night and by postoperative day  four had  been afebrile and hemodynamically stable with no low-grade fevers for over  24 hours. The patient was subsequently discharged to home in stable  condition. The plan will be for our office to contact her with a date of  follow-up appointment.     Tina Mech, MD                        [name], M.D.    JP/MEDQ  D:  03/08/2004  T:  03/08/2004  Job:  [id number]

## 2011-03-10 NOTE — Assessment & Plan Note (Signed)
[name] returns to clinic today for follow-up evaluation.  Unfortunately,  she has been seeing her surgeon and was found to have some type of tumor or  cyst on her right ovary.  There are planning a laparoscopic resection on  July 12, 2004, with Dr. Loyal Jacobson.  She also reports that Dr. Truett Perna  may also be on standby.  The patient does have a history of renal cell  carcinoma treated by Dr. Isabel Caprice in the past.   The patient reports that she has been using the Xanax 0.25 mg.  She reports  that that does not seem to be strong enough and she would like a stronger  dose of the same medication for increased anxiety.   We did copy the MRI scan result of her hip and gave her a copy of that.  The  MRI scan was done May 02, 2004, and showed no significant abnormality of  the left hip.  She does have a history of chronic low back pain and our last  MRI scan of her lumbar spine was done June 02, 2003.  We will probably  repeat that study in October or November when she recovers from this  upcoming surgery.  The patient does report that she uses her hydrocodone,  approximately 2 tablets three times a day with an occasional extra dose.  She does need a refill on hydrocodone as of tomorrow.  She also would like  an increased dose of the Xanax as noted above.  She continues to take her  Soma 350 mg three times a day.   MEDICATIONS:  1.  Celexa 40 mg daily.  2.  Premarin 0.625 mg daily.  3.  Synthroid 100 mcg daily.  4.  Allegra daily.  5.  Amitriptyline 25 mg at bedtime.  6.  Lunesta 1 tablet at bedtime.  7.  Aleve PRN  8.  Hydrocodone 7.5/750, 2 tablets p.o. three times a day PRN (approximately      6 per day).  9.  Soma 350 mg three times a day.   PHYSICAL EXAMINATION:  Well-appearing, well-nourished adult female.  Blood  pressure 116/53; pulse 83; respiratory rate 14; O2 saturations 97% on room  air.  She has 5-/5 strength throughout bilateral upper and lower  extremities.  Bulk  and tone were normal.  Reflexes 2+ and symmetrical.  She  ambulates without assistive device.  There was minimal fluid buildup in the  right knee on exam today.   IMPRESSION:  1.  Status lumbar decompression and arthrodesis L3-25 December 2001.  2.  Chronic low back pain, status post prior hemilaminectomy and partial      facetectomy April 1999.  3.  Chronic left lumbar spondylosis.  4.  Prior arthroscopic debridement of the right knee x2.  5.  Chronic left trochanteric bursitis.  6.  Left cervical stenosis C5-6 and foraminal stenosis C6-7.  7.  History of renal cell carcinoma treated by Dr. Isabel Caprice.  8.  New onset right ovarian tumor.   In the clinic today, we did refill her Xanax at 0.5 mg, 1 tablet p.o. three  times a day as of the 17th.  She will be using her Xanax 0.25 mg until that  time and will be doubling the dose up to the equivalent of 0.5 mg.  We have  also refilled her hydrocodone 7.5/325 mg and she is allowed to take 2  tablets p.o. three times a day, a total of 180, as of tomorrow.  That  dose  of hydrocodone is switched in terms of the Tylenol from 750 mg to 325 mg  each tablet to put her at lower risk of liver problems.  She has had liver  function tests which were normal recently.   We will plan on seeing the patient in followup in approximately late October  to early November 2005 after she has recovered from this planned  laparoscopic surgery with Dr. Loyal Jacobson.      [name], M.D.   DC/MedQ  D:  07/04/2004 11:16:37  T:  07/04/2004 12:48:53  Job #:  [id number]

## 2011-03-10 NOTE — Assessment & Plan Note (Signed)
MEDICAL RECORD NUMBER:  [id number]   DATE OF BIRTH:  [birth year]   REASON FOR EVALUATION:  Ms. Flegal returns to clinic today for followup  evaluation.  She reports that she is doing a little bit better today,  especially with the warmer weather.  She continues to take her pain  medicines and still has persistent pain, especially of her bilateral legs,  more of the right knee along with her right shoulder.  She reports that Dr.  Rennis Chris for the right shoulder and Dr. Hayden Rasmussen for the right knee have  been told that she is trying to avoid any surgery at this time, as she has a  wedding coming up within her family.   Dr. Isabel Caprice recently saw her for hematuria that was microscopic.  He  apparently sent a urinalysis out for evaluation and may have been for  pathology, as she has a history of renal cell carcinoma.  He also requested  a chest x-ray, but she has not had that done yet.   The patient  reports that she continues to take her Relafen twice a day per  Dr. Rennis Chris.  She does need a refill on her Ambien and Avinza in the office  today.  She reports that Avinza is helping more than any medicine that she  has taken in the past.   MEDICATIONS:  1.  Celexa 40 mg daily.  2.  Premarin 0.625 mg daily.  3.  Synthroid 100 mcg daily.  4.  Allegra daily.  5.  Amitriptyline 25 mg nightly.  6.  Ambien 10 mg nightly.  7.  Avinza 30 mg 3 times daily and occasional one extra tablet daily.  8.  Soma 350 mg 3 times daily.  9.  Flexeril 10 mg two tablets nightly.   REVIEW OF SYSTEMS:  Positive for knee pain on the right.   PHYSICAL EXAMINATION:  GENERAL:  A reasonably well-appearing adult female in  mild acute discomfort.  VITAL SIGNS:  Blood pressure 133/77 with a pulse of 93, respiratory rate of  16 and O2 saturation 94% on room air.  NEUROMUSCULAR:  She has 4/5 strength  distally.  Left upper extremity strength was 4/5 and bilateral lower  extremity strength was 4 to 4+/5.   IMPRESSION:  1.  Status post lumbar decompression/arthrodesis, L3-L5, March 2003.  2.  Chronic low back pain, status post prior hemilaminectomy and partial      facetectomy, April 1999.  3.  Chronic lumbar spondylosis.  4.  Left cervical stenosis, C5-6 and C6-7.  5.  Chronic restless leg syndrome.  6.  History of renal cell carcinoma.  7.  Status post right rotator cuff surgery, October 2006.  8.  Osteoarthritis of the right knee.   DISCHARGE MEDICATIONS:  In the office today, we did refill the patient's  Avinza and Ambien medications.  She has a sufficient supply of the  amitriptyline and Soma at this point.   FOLLOWUP:  We will plan on seeing her in followup in approximately 3 months'  time.           ______________________________  [name], M.D.     DC/MedQ  D:  01/15/2006 13:44:59  T:  01/17/2006 05:11:31  Job #:  [id number]

## 2011-03-10 NOTE — Assessment & Plan Note (Signed)
MEDICAL RECORD NUMBER:  [id number]   [name] returns to the clinic today for follow-up evaluation. When I last  saw her June 29, 2005, we were using oxycodone and she was using up to  eight per day. She subsequently called in around the third week in October  and asked to be switched from that medication. We subsequently started her  on Avinza 30 mg twice a day. She reports that she presently takes and gets  about 5 hours of relief with each dose. She would like to have an adjustment  in her medication. She has undergone the right shoulder surgery July 27, 2005, with Dr. Rennis Chris. She reports that she did follow up with Dr. Rennis Chris  approximately 2 weeks ago and has a follow-up scheduled for September 06, 2005. He apparently knows that she has persistent pain, and according to the  patient's report there was no promise about complete pain relief with the  last shoulder surgery. Apparently, there was significant damage to the  rotator cuff and he performed an open right shoulder subacromial bursectomy  and lysis of adhesions as well as removal of retained sutures from the prior  rotator cuff repair. 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   The patient reports that she has three more therapy visits this year. She  also was started on Relafen one tablet twice a day by Dr. Rennis Chris.   MEDICATIONS:  1.  Celexa 40 mg daily.  2.  Premarin 0.625 mg daily.  3.  Synthroid 100 mcg daily.  4.  Allegra daily.  5.  Amitriptyline 25 mg nightly.  6.  Ambien 10 mg nightly.  7.  Avinza 30 mg b.i.d.  8.  Soma 350 mg t.i.d.  9.  Flexeril 10 mg two tablets q.h.s.   REVIEW OF SYSTEMS:  Noncontributory.   PHYSICAL EXAMINATION:  Reasonably well-appearing, mildly- to moderately-  overweight adult female in moderate to mild acute discomfort. Blood pressure  109/48 with a pulse of 86, respiratory rate 16, and O2 saturation 98% on  room air. She has 3-/5 strength  throughout the right shoulder. Right upper  extremity strength was otherwise 4/5 distally. Left upper extremity strength  was 4+/5.   IMPRESSION:  1.  Status post lumbar decompression/arthrodesis at L3-L5 March 2003.  2.  Chronic low back pain status post prior hemilaminectomy and partial      facetectomy April 1999.  3.  Chronic lumbar spondylosis.  4.  Left cervical stenosis C5-6 and C6-7.  5.  Chronic restless leg syndrome.  6.  History of renal cell carcinoma.  7.  Status post recent surgery of her right shoulder for recurrent retracted      rotator cuff injury.  8.  Osteoarthritis of the right knee.   At the present, we have decided to try to switch her from Avinza to morphine  sulfate extended release at 30 mg three times a day. This will be an  increase from twice a day to three times a day and hopefully we will be able  to get slightly less expensive medicine compared to the Avinza. We have also  refilled her Flexeril, Ambien, and Soma in  the office today. We will plan on seeing her in follow-up in approximately 3  months' time and she will, of course, call in if other problems arise.           ______________________________  [name], M.D.     DC/MedQ  D:  08/24/2005 10:32:02  T:  08/24/2005 11:41:13  Job #:  [id number]

## 2011-03-10 NOTE — H&P (Signed)
[place]  Patient:    [name], [name] Visit Number: [id number] MRN: [id number]          Service Type: SUR Location: [id number] Attending Physician:  Jonne Ply Dictated by:   Stefani Dama, M.D. 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  HISTORY OF PRESENT ILLNESS:  The patient is a 60 year old individual whom I first saw in August of 2002.  At that time she complained of significant pain with burning in the left hip with pain radiating through the left hip through the posterior thigh and intermittent lower extremity pain down to the heel of the left leg.  The patient had no problems with bowel or bladder control.  At that time I evaluated an MRI which showed that the patient had had a previous laminectomy at L4-5 on the right side and she had some stenosis at the L3-L4 level.  I advised further conservative management.  I did not feel that surgery was indicated.  The patient had a normal neurologic examination.  The patient returned in March of this year with new MRI on October 26, 2001.  This study demonstrated that there had been progression of the stenotic changes at the L3-L4 levels from the films a year ago.  L4-L5 showed that she had progression of some spondylitic changes which were minimal at best. Nonetheless because of the significant changes at L3-4, I advised surgical decompression of L3-L4 with a stabilization procedure because a spondylolisthesis was noted at that level.  Furthermore at L4-5 because of the spondylitic changes there, I advised that this could be incorporated into the arthrodesis.  L5-S1 appeared to be fairly stable.  She is now being admitted to undergo this above surgical procedure.  PAST MEDICAL HISTORY:  The patients  general health has been good.  She had previous surgery including tonsillectomy and broken left hip in 1955 and then again in 1956 which required surgery.  An appendectomy in 58, hysterectomy in 76, back surgery in 1984 and then again she had shoulder surgery in 1999 and 2000, knee surgery in 1990, again in 1992.  ALLERGIES:  She notes an allergy to AMPICILLIN which causes some sensitivity.  CURRENT MEDICATIONS: 1. Synthroid. 2. Celexa. 3. Lipitor. 4. Allegra. 5. Premarin. 6. Vioxx. 7. Hydrocodone. 8. Flexeril.  SOCIAL HISTORY:  She does not smoke.  She drinks alcohol on a social basis. She has had some weight gain and currently states her height and weight at 5 feet 6 inches and 189 pounds.  REVIEW OF SYSTEMS:  Notable for night sweats, wearing of glasses, nasal congestion and drainage, sinus problems, sinus headaches, swelling in the hands and feet, arm weakness, leg weakness, back pain, arm pain, neck pain, joint pain, swelling, arthritis, indigestion and problems with memory, skin disease, thyroid problems and chronic anemia.  PHYSICAL EXAMINATION:  GENERAL:  She is an alert and oriented individual in no overt distress.  She stands straight and erect without difficulty.  Straight leg raising reproducing pain in the region of the left hip and Patricks maneuver is positive on the left side.  Patricks maneuver on the right side is negative. Palpation and percussion of her back produces moderate tenderness in the lumbosacral junction and  the midline region above where she has a well-healed scar.  Mobility in her back appears quite normal and there is tenderness over the spinous processes notably most at L4.  Motor strength in the lower extremities reveals that the iliopsoas, quad, tibialis anterior and gastroc have normal strength, tone and bulk to confrontation; however, tibialis anterior on the left appears to be weak to 4+/5 when heal walking for any distance.  Deep  tendon reflexes are 2+ in the patellae, 1+ in the Achilles. Babinskis downgoing.  Sensation is intact to pin and light touch.  Straight leg raise is as noted above.  HEENT:  The general physical exam reveals that head, ears, eyes, nose and throat are within limits of normal and pupils are 4 mm briskly reactive to light and accommodation.  Extraocular movements were full.  The face is symmetric ____________ tongue and uvula are in the midline.  Sclerae and conjunctivae are clear.  Fundi reveal the disks to be flat.  Neck reveals no masses and no bruits are heard.  LUNGS:  The lungs are clear to auscultation.  HEART:  The heart has a regular rate and rhythm.  ABDOMEN:  Abdomen is soft.  Bowel sounds are positive.  No masses are palpable.  EXTREMITIES:  Extremities reveal no clubbing, cyanosis or edema.  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  She also had severe spondylosis at L4-5, particularly on the left side.  She has been advised regarding surgical decompression and stabilization of the lower two levels of the lumbar spine. She is now being admitted to undergo this procedure. Dictated by:   Stefani Dama, M.D. Attending Physician:  Jonne Ply DD:  01/06/02 TD:  01/07/02 Job: [id number]

## 2011-03-10 NOTE — Assessment & Plan Note (Signed)
MEDICAL RECORD NUMBER:  [id number].   Ms. Eckstrom returns to clinic today for followup evaluation. Since her last  appointment October 31, 2004, she has undergone arthroscopy of the right knee  December 20, 2004 by Dr. Thomasena Edis. She still has problems involving pain at  the right knee and had a cortisone injection into that right knee  approximately one month ago. Dr. Thomasena Edis is still planning possible Hyalgan  injection into the right knee and possible future total knee replacement.   The patient had multiple complaints. One is pain of her anterolateral right  upper arm. She reports that she also has cramps of her legs which are a  problem for her at night and causing her sleeping problems. This is despite  using the Soma at night along with the Ambien. She also reports that she has  pain and itching of her bladder region. She reports that she had been  diagnosed with chronic cystitis and was tried on Vesicare but was unable to  tolerate that. She is due to followup with her urologist tomorrow. She does  report that she takes hydrocodone 7.5/325 approximately two tablets three to  four times per day. She would like to have a refill on that dated for early  May so that we can avoid problems with call ins.   MEDICATIONS:  1.  Celexa 40 mg q.d.  2.  Premarin 0.625 mg q.d.  3.  Synthroid 100 mcg q.d.  4.  Allegra daily.  5.  Amitriptyline 25 mg q.h.s.  6.  Ambien 10 mg q.h.s.  7.  Hydrocodone 7.5/325 two tablets p.o. t.i.d. p.r.n.  8.  Soma 350 mg t.i.d.  9.  Relpax daily p.r.n.   PHYSICAL EXAMINATION:  Reasonably well appearing adult female in mild acute  discomfort. Blood pressure 135/75 with a pulse of 66, respiratory rate 16,  and O2 saturation 97% on room air. She complains of pain of her right upper  lateral arm and would like an injection into that area today. She also  complains of right knee pain which is chronic along with pain of her low  back. We did review the MRI scans of  her back done November 04, 2004. This  showed stable L3 to L5 fusion without any spinal or foraminal stenosis at  those two levels. There was a swallow disk protrusion at L5-S1 which  appeared stable and may contact the left S1 nerve root. There was mild  diffuse annular bulge at L2-3 in combination with facet joint hypertrophy  and ligamentum flavum thickening, creating early spinal and lateral recess  stenosis which also appeared relatively stable.   IMPRESSION:  1.  Status post lumbar decompression/arthrodesis, L3-L5, March 2003.  2.  Chronic low back pain, status post prior hemilaminectomy and partial      facetectomy April 1999.  3.  Chronic left lumbar spondylosis.  4.  Left trochanteric bursitis.  5.  Left cervical stenosis at C5-6 and foraminal stenosis at C6-7.  6.  History of renal cell carcinoma.  7.  Borderline peritoneal serous tumor, status post resection 2005.  8.  Chronic restless leg syndrome.   In the office today, we did give her a refill on the Vicodin 7.5/325 as of  December 21, 2004. We also gave her a new prescription for Flexeril to be taken  10 mg 2 tablets p.o. q.h.s. to see we can help her with her restless leg  syndrome. We will plan on seeing the patient in followup in this  office in  approximately three months' time with refill of  medications prior to that appointment as necessary. She will be following up  with Dr. Isabel Caprice, her urologist, tomorrow for evaluation and treatment of  chronic hematuria and cystitis.      DC/MedQ  D:  01/30/2005 11:55:22  T:  01/30/2005 13:11:45  Job #:  [id number]

## 2011-03-10 NOTE — Op Note (Signed)
[name], [name]:  [id number]   MEDICAL RECORD NO.:  [id number]          PATIENT TYPE:  INP   LOCATION:  1511                         FACILITY:  [facility]   PHYSICIAN:  Erasmo Leventhal, M.D.DATE OF BIRTH:  Aug 15, 1951   DATE OF PROCEDURE:  08/09/2006  DATE OF DISCHARGE:                                 OPERATIVE REPORT   PREOPERATIVE DIAGNOSIS:  Right knee end stage osteoarthritis.   POSTOPERATIVE DIAGNOSIS:  Right knee end stage osteoarthritis.   PROCEDURE:  Right total knee arthroplasty.   SURGEON:  Erasmo Leventhal, M.D.   ASSISTANT:  Jaquelyn Bitter. Chabon, P.A.-C.   ANESTHESIA:  Spinal with monitored anesthesia care.   ESTIMATED BLOOD LOSS:  Less than 100 mL.   COMPLICATIONS:  None.   DISPOSITION:  To the PACU stable.   PROCEDURE IN DETAIL:  The patient was counseled in the holding area, correct  side was identified.  IV was started, antibiotics were given.  She was taken  to the OR where a spinal anesthetic was administered by Dr. Rica Mast.  She  was placed in the supine position and a Foley catheter was placed utilizing  sterile technique by the OR circulating nurse.  The operative extremity was  well padded.  The right knee was examined, it had a 5 degree flexion  contracture, could flex to 120 degrees.  It was elevated, prepped with  DuraPrep and draped in a sterile fashion.  It was exsanguinated with an  Esmarch and the tourniquet inflated to 300 mmHg.  A straight midline  incision was made through the skin and subcutaneous tissue.  Medial and  lateral soft tissue flaps were developed.  The small vessels were  electrocoagulated.  A medial parapatellar arthrotomy was performed, a  proximal medial release was done just enough to see the proximal medial  tibia.  The patella was retracted but not everted.  The knee was flexed.  End stage arthritic changes.  The cruciate ligaments were resected.  The  starting hole was made in the distal  femur, the canal was irrigated, the  effluent was clear, extramedullary guide was gently placed.  We chose to a 5  degree valgus cut and a 10 mm cut off the distal femur.  The distal femur  was found to be a size 3.  Rotation marks were cut and set and it was set to  cut for the size 3.  I will note that the patient's bone quality was  relatively soft.  The medial and lateral menisci were removed.  The  geniculate vessels were coagulated under direct visualization.  The tibial  eminence was resected.  The proximal tibia was subluxed, found to be a size  3, central aspect was noted, reamed with the step reamer, and the canal was  irrigated until the effluent was clear.  An intramedullary guide was gently  placed.  We chose a 10 mm cut based upon the least efficient side.  This was  a 0 0 degree slope.  Posteromedial approach, lateral femoral chamfer was  augmented under direct visualization.  At  this time, with the 10 mm flexion  extension blocks, we were well balanced in the flexion and extension gaps.  The knee was then flexed, the tibial baseplate was applied, rotation cup was  set, reamer and punch.  The femoral box cutting guide was applied and  general femoral box cut was performed.  With a size 3 femur, size 3 tibia,  10 insert, we had excellent range of motion and soft tissue balance.  We  only took a very small shim cut across the top with small size bone that we  could, found to be a size 32.  Locking holes were made, patellar tracking  was anatomic now with the button in place.  The trials were then removed.  The knee was irrigated with pulsatile lavage.  Utilizing Moder cement  technique, all components were cemented into place, size 3 tibia, size 3  femur, with a 32 patella.  After the cement had cured with a trial of 10 mm  insert, we had an excellent range of motion and soft tissue balance.  The  trial was removed, excess cement was removed, the knee was copiously  irrigated  again, and the final 10 mm posterior stabilized rotating platform  tibial insert was implanted.  We now had a well balanced knee, excellent  alignment and fixation.  Two medium Hemovac drains were placed.  Sequential  closure of the layers were done.  The arthrotomy with Vicryl, subcu with  Vicryl, the skin was closed with subcuticular Monocryl suture.  Steri-Strips  were applied.  The drain was then hooked to suction.  A sterile dressing was  applied to the knee.  There were no complications or problems.  The  tourniquet was deflated, she had normal circulation at the end of the case.  She tolerated the procedure well with no complications.  She was taken to  the recovery room in stable condition.  To help with surgical time and  decision making, Mr. Jodene Nam, P.A.-C. was needed throughout the  entire case.           ______________________________  Erasmo Leventhal, M.D.     RAC/MEDQ  D:  08/09/2006  T:  08/11/2006  Job:  [id number]

## 2011-03-10 NOTE — Assessment & Plan Note (Signed)
Ms. Belli returns to the clinic today for follow-up evaluation.  She  has recovered well from her recent right total knee replacement done by  Dr. Hayden Rasmussen.  She reports that she is due to have vertebroplasty  done in her thoracic spine with Dr. Danielle Dess tomorrow.  Apparently, there  is a vertebral compression fracture and it is pressing on a nerve.  The  patient was told by Dr. Danielle Dess that vertebroplasty should help that  condition.   In terms of her overall pain medicine, she continues to take the  morphine sulfate continuous release 30 mg q.i.d.  That was the change  that we had made up from t.i.d. during the last clinic visit when we  also had discontinued the immediate release morphine.  She feels that  she is getting reasonable benefit from that although she does take an  extra tablet and is short on the prescription a few days.  She reports  that no other medicine changes have been made.  She does need a refill  on the Soma also in the office today.   MEDICATIONS:  1. Celexa 40 mg q. day.  2. Premarin 0.625 mg q. day.  3. Synthroid 100 mcg q. day.  4. Allegra daily.  5. Amitriptyline 25 mg nightly.  6. Ambien 10 mg nightly.  7. Morphine sulfate continuous release 30 mg q.i.d.  8. Soma 350 mg t.i.d. p.r.n.  9. Flexeril 10 mg 2 tablets p.o. nightly.  10.Pravachol q. day.  11.Xanax 0.5 mg t.i.d. p.r.n.   REVIEW OF SYSTEMS:  Positive for urinary retention along with vertebral  pain in her upper thoracic region.   PHYSICAL EXAMINATION:  Reasonably well appearing, slightly overweight  adult female in mild acute discomfort.  Blood pressure is 134/73, with  pulse of 98, respiratory rate 16, and O2 saturation 97% on room air.  She has 4+/5 strength throughout the bilateral upper and lower  extremities.  Bulk and tone were normal.  Reflexes are 2+ and  symmetrical.   IMPRESSION:  1. Status post lumbar decompression/arthrodesis L3-L5 March 2003.  2. Chronic low back pain,  status post hemilaminectomy and partial      facetectomy April 1999.  3. Chronic lumbar spondylosis.  4. Left cervical stenosis C5-6 and C6-7.  5. History of renal cell carcinoma.  6. Status post rotator cuff surgery in the right upper extremity      October 2006.  7. 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  8. Status post breast reduction surgery bilaterally.  9. Status post fall with left wrist fracture.   In the office today we did refill the patient's Soma along with her  morphine sulfate continuous release at 30 mg q.i.d.  We will plan on  seeing her in follow-up in approximately 3 months time.  Hopefully she  will gain some relief from the vertebroplasty planned with Dr. Danielle Dess  for tomorrow.           ______________________________  [name], M.D.     DC/MedQ  D:  11/28/2006 09:52:54  T:  11/28/2006 10:21:32  Job #:  [id number]

## 2011-03-10 NOTE — Assessment & Plan Note (Signed)
[name] returns to clinic today for follow-up evaluation.  She has  undergone a recent right shoulder surgery for  rotator cuff repair by Dr.  Rennis Chris approximately four weeks ago.  She is doing outpatient therapies at  this time.  She still has decreased range of motion especially in abduction  but has fairly good flexion at the present time.  She is also complaining of  a fair amount of right knee pain.  She has had Dr. Hayden Rasmussen do cortisone  injections at least once in the past, a series of three and then also she  has had Synvisc injections in the past.  She follows up with him in the next  several weeks and is considering having repeat injections.  She would like  to try to put off any total knee replacement for several months so that she  could recover from this recent right shoulder surgery.  She has had repeat  surgeries to the shoulder and knee on the right side over the past several  years.   The patient does need a refill on her Soma, Flexeril, Ambien and oxycodone  in the office today.   MEDICATIONS:  1.  Celexa 40 mg daily.  2.  Premarin 0.625 mg daily.  3.  Synthroid 100 mcg daily.  4.  Allegra daily.  5.  Amitriptyline 25 mg nightly.  6.  Ambien 10 mg nightly.  7.  Oxycodone 5 mg two tablets p.o. q.i.d. p.r.n. (eight per day).  8.  Soma 350 mg t.i.d.  9.  Flexeril 10 mg one to two tablets p.o. nightly p.r.n.   PHYSICAL EXAMINATION:  GENERAL:  A well-appearing mildly overweight adult  female in mild acute discomfort.  VITAL SIGNS:  Blood pressure 109/60 with pulse 77, respiratory rate 16 and  O2 saturation 95% on room air.   She has 3-/5 strength in the abduction of the right shoulder.  Right upper  extremity examination otherwise reveals 4/5 strength.  Left upper extremity  strength was 4+5.  Bilateral lower extremity strength was 4+/5.   REVIEW OF SYSTEMS:  Noncontributory.   IMPRESSION:  1.  Status post lumbar decompression/arthrodesis L3-L5 March  2003.  2.  Chronic low back pain, status post prior hemilaminectomy and partial      facetectomy April 1999.  3.  Chronic lumbar spondylosis.  4.  Left trochanteric bursitis.  5.  Left cervical stenosis at C5-6 and C6-7.  6.  History of renal cell carcinoma.  7.  Chronic restless leg syndrome.  8.  Peritoneal serous tumor, status post resection in 2005.  9.  Recent repair of right rotator cuff tear.  10. Osteoarthritis of the right knee.   In the office today, we did refill the patient's oxycodone along with her  Ambien, Soma and Flexeril medications.  We will plan on seeing the patient  in follow-up in approximately two months' time with refill of medication  prior to that appointment.  I have encouraged her to talk with Dr. Hayden Rasmussen about repeat injections either with Cortisone  or Synvisc into the right knee.  She continues on outpatient therapy for  recovery after the recent right rotator cuff surgery.  We will plan on  seeing her in follow-up as noted above.           ______________________________  [name], M.D.     DC/MedQ  D:  06/29/2005 10:13:07  T:  06/29/2005 10:57:08  Job #:  [id number]

## 2011-03-10 NOTE — Assessment & Plan Note (Signed)
REASON FOR VISIT:  [name] returns to clinic today for follow-up  evaluation.  I last saw her in this office on January 30, 2005.  At that time,  she was using Vicodin, but she subsequently called back requesting a switch  to oxycodone.  We have had her on oxycodone 5 mg two tablets p.o. t.i.d.  She generally uses six per day, but occasionally does go up to eight.  She  is having a fair amount of pain involving her right shoulder where she is  due to have rotator cuff repair and spurs treated by Dr. Hayden Rasmussen in mid  August 2006.  She has had two cortisone injections into her right shoulder  without any benefit.  She is requesting an increase in her pain medicines  from her baseline.   The patient did have a recent EKG which was abnormal, but a followup  Cardiolite stress test was unremarkable.  She also had ultrasound of her  gallbladder that reportedly showed slightly enlarged duct in her liver, but  a CAT scan of her abdomen was judged to be within normal limits.  She  continues to have injections of her right knee with Dr. Hayden Rasmussen, but  those have given her minimal benefit.  She does not need a refill on her  Ambien or Flexeril in the office today, but does need one on her Soma and  oxycodone.   MEDICATIONS:  1.  Celexa 40 mg q.d.  2.  Premarin 0.625 mg q.d.  3.  Synthroid 100 mcg q.d.  4.  Allegra q.d.  5.  Amitriptyline 25 mg q.h.s.  6.  Ambien 10 mg q.h.s.  7.  Oxycodone 5 mg two tablets p.o. t.i.d. p.r.n. (6-8 per day).  8.  Soma 350 mg t.i.d.  9.  Flexeril 10 mg one to two tablets p.o. q.h.s. p.r.n.   PHYSICAL EXAMINATION:  GENERAL:  Reasonably well-appearing, adult female in  mild to moderate acute discomfort.  VITAL SIGNS:  Blood pressure 125/61, pulse 87, respirations 16, O2  saturations 93% on room air.  NEUROLOGIC:  She has 3+/5 strength in her right upper extremity at least  proximally and 4+/5 strength distally.  Left upper extremity strength  generally  4/5.  Bilateral lower extremity strength was 4+/5.   IMPRESSION:  1.  Status post lumbar decompression/arthrodesis L3-L5 in March 2003.  2.  Chronic low back pain, status post prior hemilaminectomy and partial      facetectomy in April 1999.  3.  Chronic lumbar spondylosis.  4.  Left trochanteric bursitis.  5.  Left cervical stenosis at C5-C6 and C6-C7.  6.  History of renal cell carcinoma.  7.  Chronic restless leg syndrome.  8.  Peritoneal serous tumor, status post resection in 2005.  9.  Right rotator cuff tear.   PLAN:  In the office today, we did refill her oxycodone 5 mg and allowed her  to go up to two tablets p.o. q.i.d., a total of 240 were written.  She will  use up the prescription she has and fill this new prescription as of May 07, 2005.  We also refilled her Soma at 350 mg one tablet p.o. t.i.d.  Ideally, the oxycodone that we have her on at present would continue up  until the time of her surgery.  We will have to reassess her pain level at  that time when she is finished with surgery with Dr. Hayden Rasmussen.   I will plan on seeing  her in followup in approximately 2 months' time to  allow adequate recovery time from her planned surgery in mid August.       DC/MedQ  D:  04/27/2005 10:03:33  T:  04/27/2005 10:51:35  Job #:  [id number]

## 2011-03-10 NOTE — Assessment & Plan Note (Signed)
DATE OF EVALUATION:  October 31, 2004.   MEDICAL RECORD NUMBER:  [id number].   DATE OF BIRTH:  09-17-1951.   [name] returns to the clinic today for followup evaluation.  She reports  that overall she is doing reasonably well.  She has had a recent cortisone  injection into her right knee by Erasmo Leventhal, M.D.  She reports an  MRI scan is scheduled for November 05, 2004, involving her right knee.  She  has a history of left hip trochanteric bursitis and reports that she has  been told she probably has bursitis of the right hip with no arthritis being  seen on x-rays.   The patient continues to be followed by Dr. Vincente Poli and Dr. Stanford Breed  for peritoneal serous tumor.  She has also been contacted by [facility] to see if  she would like to participate in their cancer study for ovarian cancer.   The patient requests a refill prescription for Ambien, but she would like  the continuous release as she has used the Ambien regular in the past.  She  has not gained any relief with Alfonso Patten that she has been using and the copay  is high.   The patient also reports that her back pain has been giving her more  problems recently.  The last MRI scan of her lumbar spine was in August of  2004.  The patient continues to get help for migraines with Relpax.   MEDICATIONS:  1.  Celexa 40 mg daily.  2.  Premarin 0.625 mg daily.  3.  Synthroid 100 mcg daily.  4.  Allegra daily.  5.  Amitriptyline 25 mg q.h.s.  6.  Lunesta one tablet q.h.s.  7.  Oxycodone 5 mg two tablets p.o. t.i.d. (approximately six per day).  8.  Soma 350 mg t.i.d.  9.  Relpax daily p.r.n.   PHYSICAL EXAMINATION:  A well-appearing, well-nourished, adult female in  mild acute discomfort.  Blood pressure 117/69 with a pulse of 78,  respiratory rate 18 and O2 saturation 97% on room air.  She has 5-/5  strength throughout the bilateral upper and lower extremities.  Lumbar range  of motion was decreased in all planes  with complaints of pain.  She  ambulates without assistive device.  Sensation was intact to light touch  throughout the bilateral upper and lower extremities.   IMPRESSION:  1.  Status post lumbar decompression/arthrodesis at L3-5 in March of 2003.  2.  Chronic low back pain, status post prior hemilaminectomy and partial      facetectomy in April of 1999.  3.  Chronic left lumbar spondylosis.  4.  Chronic left trochanteric bursitis.  5.  Left cervical stenosis at C5-6 and foraminal stenosis at C6-7.  6.  History of renal cell carcinoma.  7.  Borderline peritoneal serous tumors, status post resection in September      of 2005.   In the clinic today we did set the patient up for a followup MRI of her  lumbar spine to be compared with the August of 2004 study to rule out  worsening stenosis.  We also refilled her Soma and Ambien in the office  today.  We will  plan on seeing the patient in followup in this office in approximately two  to three months' time.  I will contact her regarding the results of the MRI  scan if that is available.       DC/MedQ  D:  10/31/2004  12:01:56  T:  10/31/2004 12:23:30  Job #:  [id number]

## 2011-03-10 NOTE — Assessment & Plan Note (Signed)
HISTORY:  Ms. Mcinroy returns to clinic today for followup evaluation.  She  reports that overall the morphine sulfate extended release 30 mg used three  times a day is helping her.  She does use an occasional tablet periodically  and gets good relief.  She would to have the ability to add an extra tablet  through the day as needed after her three tablets are used up.  She reports  that Dr. Hayden Rasmussen has done injections into her right knee twice recently  with removal of fluid from the lateral aspect of the right knee.  She is due  for a third injection this week.  She also reports that Dr. Rennis Chris has told  her that she may eventually need a shoulder replacement on the right side  where she has had rotator cuff surgery in October of 2006.  She is trying to  put both of those surgeries off until absolutely necessary.   MEDICATIONS:  1.  Celexa 40 mg daily.  2.  Premarin 0.625 mg daily.  3.  Synthroid 100 mcg daily.  4.  Allegra daily.  5.  Amitriptyline 25 mg nightly.  6.  Ambien 10 mg nightly.  7.  Avinza 30 mg b.i.d. and occasionally one tablet extra daily.  8.  Soma 350 mg t.i.d.  9.  Flexeril 10 mg two tablets q.h.s.   REVIEW OF SYSTEMS:  Fluid on the right knee.   PHYSICAL EXAMINATION:  GENERAL:  A well-appearing, fit, adult female in mild  to moderate acute discomfort.  VITAL SIGNS:  Blood pressure was 127/71 with a pulse of 95, respiratory rate  16, and O2 saturation 97% on room air.  NEUROLOGIC:  She is 3-/5 strength throughout the right upper extremity  proximally, and 4/5 strength distally.  Left upper extremity strength was  4/5.  Bilateral lower extremity strength was generally 4 to 4+/5.   IMPRESSION:  1.  Status post lumbar decompression/arthrodesis, L3-L5, March 2003.  2.  Chronic low back pain, status post prior hemilaminectomy and partial      facetectomy, April 1999.  3.  Chronic lumbar spondylosis.  4.  Left cervical stenosis, C5-6 and C6-7.  5.  Chronic  restless leg syndrome.  6.  History of renal cell carcinoma.  7.  Status post right rotator cuff surgery, October 2006.  8.  Osteoarthritis of the right knee.   In the office today, we did refill the patient's Flexeril, Amitriptyline,  and Soma as of today.  We also allowed her a slight increase on her morphine  sulfate extended release to 30 mg t.i.d. and one extra tablet daily up to 10  days per month.  That is a total of 100 tablets and that was written for  December 07, 2005, when she needs her next refill.  We will plan on seeing  her in followup in this office in approximately two months time with refill  medication prior to that appointment as necessary.          ______________________________  [name], M.D.    DC/MedQ  D:  11/20/2005 09:44:05  T:  11/20/2005 04:54:09  Job #:  [id number]

## 2011-03-10 NOTE — Assessment & Plan Note (Signed)
A 60 year old female with history of left shoulder pain.  She has a  diagnostics/therapeutic injection of the left subacromial bursa with 1%  lidocaine x 4 mL and 1 mL of 40 mg/mL Kenalog.  Post injection, she had  good relief of pain.  She can now raise her arm over her head without  any significant pain.  Given these results, we will not order any  further cervical imaging studies.      Erick Colace, M.D.  Electronically Signed     AEK/MedQ  D:  07/05/2009 17:05:13  T:  07/06/2009 16:10:96  Job #:  [id number]   cc:   Stefani Dama, M.D.  Fax: [phone]

## 2011-03-10 NOTE — Assessment & Plan Note (Signed)
CONTINUATION:  This is a continuation of a note that I just finished, and  the note's number is [id number]. I need to continue before we sign at the bottom  there:  In the office today, we did have a patient sign a consent for the  right upper arm steroid injection. After the consent was signed, she was  chaperoned, and I prepared a 3-cm syringe with 2 cc of 1% lidocaine, 1 cc of  Kenalog 40. I cleansed the right upper arm with an alcohol pad and injected  sterilely. The patient tolerated the procedure well, and good hemostasis was  obtained.   We will plan on seeing the patient in followup as noted above.      DC/MedQ  D:  01/30/2005 11:57:24  T:  01/30/2005 13:24:54  Job #:  [id number]

## 2011-03-10 NOTE — Op Note (Signed)
[name]                 ACCOUNT NO.:  [id number]   MEDICAL RECORD NO.:  [id number]          PATIENT TYPE:  AMB   LOCATION:  SDS                          FACILITY:  [facility]   PHYSICIAN:  Stefani Dama, M.D.  DATE OF BIRTH:  01-12-1951   DATE OF PROCEDURE:  11/29/2006  DATE OF DISCHARGE:                               OPERATIVE REPORT   PREOPERATIVE DIAGNOSIS:  L1 compression fracture, status post  arthrodesis L3-L5.   POSTOPERATIVE DIAGNOSIS:  L1 compression fracture, status post  arthrodesis L3-L5.   OPERATION:  1. Percutaneous biopsy of L1 vertebra.  2. Acrylic balloon kyphoplasty via bilateral approach L1 vertebra.   SURGEON:  Dr. Barnett Abu.   ANESTHESIA:  General endotracheal.   INDICATIONS:  [name] is a 60 year old individual who has had  significant back pain at the thoracolumbar junction for the past number  of weeks.  She was evaluated in the office and had found to have  evidence of subacute fracture of the L1 vertebra.  She has previously  undergone surgical decompression arthrodesis from L3-L5.  Bone density  testing revealed that she did not have evidence of significant  osteoporosis.  That study was from September 19, 2006.  We had started  the patient on [name] nasal spray which did not give her significant  relief and therefore she is taken to the operating room to undergo  balloon kyphoplasty.   PROCEDURE:  The patient was brought to the operating room supine on the  stretcher.  After smooth induction of general endotracheal anesthesia,  she was turned prone.  The back was prepped with DuraPrep and draped in  sterile fashion.  Fluoroscopic guidance was used in the biplane  arrangement to obtain views of the L1 vertebra.  Entry sites were chosen  at the 10 o'clock and 2 o'clock levels of the left and right pedicles,  respectively.  The skin above this area was infiltrated with lidocaine  1% mixed 50/50 with 0.50% Marcaine for total of 5 mL.   Stab incision was  created with 15 blade and then a Jamshidi needle was used to perforate  the superior outer portion of the posterior arch of the pedicle, first  on the left side and then on the right side.  The needle was driven with  fluoroscopic guidance into the vertebral body through the pedicle using  a lateral to medial trajectory.  Once the tip of the needle was well  within the vertebral body, then her trocar was removed and a bone biopsy  cannula was inserted into the vertebra and a core of bone was retrieved.  The bone felt solid and the biopsy was removed.  A second trajectory was  chosen, second biopsy was obtained on the left side, then a balloon was  inserted and gradually increased in pressure to expand the cavity.  Pressure was noted to rapidly rise to approximately 250 mmHg.  This was  allowed to decay to 180 mmHg.  Meanwhile, the cannula was inserted on  the right side and an additional biopsy was obtained.  The bone was also  noted be significantly rigid.  On the left side there had been more  collapse of the vertebrae than on the right side and it did seem that  this bone was slightly softer and that the pressures decayed more  rapidly with expansion on the left side.  In the end it was evident that  approximately 4 mL of dye was injected into the balloons to pressurize  them and some reduction of the fracture, particularly on the left side,  had occurred.  Once this was accomplished, the balloons were removed and  then a cannula of the premixed cement that was allowed to harden into a  very thick consistency was inserted. A total of 2.5 mL was inserted on  the left side, 1.5 mL was inserted on the right side for total of  approximately 4 mL of cement being inserted into this fracture.  With  that, the procedure  was completed.  The cannula were removed.  A small tail existed on the  right side.  Attempts to tamp this were unsuccessful.  The wounds were  then  dressed with a singular 3-0 stitch and a dry sterile dressing was  placed on the skin.  The patient tolerated the procedure well.  Blood  loss was estimated at less than 5 mL.      Stefani Dama, M.D.  Electronically Signed     HJE/MEDQ  D:  11/29/2006  T:  11/29/2006  Job:  [id number]

## 2011-03-10 NOTE — Assessment & Plan Note (Signed)
REFERRING PHYSICIAN:  Reinaldo Meeker, M.D.   [name] returns to clinic today for follow-up evaluation.  The patient  unfortunately has had recent bad news.  She was diagnosed with a mass on her  left kidney.  Dr. Isabel Caprice, local neurologist, apparently did a resection of  that mass of her left kidney and part of the kidney was also removed.  The  pathology on that came back positive for a renal cell type of cancer.  The  patient reports that no chemotherapy or radiation is planned.  Dr. Isabel Caprice  apparently feels that there is a high percentage of chance of complete  resolution with the resection that he did.   After surgery, the patient was initially placed on Tylox 5/500 one to two  tablets p.o. q.4-6h.  She used that medication in place of her hydrocodone  which we had her on for a period of time.  Now that she is away from the  initial postoperative pain, she has returned to taking her hydrocodone  7.5/750 and she takes approximately two tablets in the morning, two tablets  at noon, two tablets in the evening and one tablet at bedtime.  She  generally uses approximately seven per day of the hydrocodone.   The patient reports that she still has persistent pain around her surgery  area.  She would like to have some Tylox just for a short period of time to  be used on an as needed basis.   MEDICATIONS:  1. Celexa 40 mg daily.  2. Premarin 0.625 mg daily.  3. Synthroid 100 mcg daily.  4. Allegra daily.  5. Amitriptyline 25 mg q.h.s.  6. Ambien 10 mg q.h.s.  7. Crestor 10 mg daily.  8. Aleve p.r.n.  9. Hydrocodone 7.5/750 two tablets p.o. t.i.d. to q.i.d. (seven per day).   PHYSICAL EXAMINATION:  GENERAL APPEARANCE:  A well-appearing adult female.  VITAL SIGNS:  Blood pressure 143/73, pulse 79, respiratory rate 14, O2  saturation 99% on room air.  NEUROLOGIC:  The patient has 5/5 strength throughout the bilateral upper  extremities.  Left lower extremity strength was 4/5.   Right lower extremity  strength was 4+/5.  The patient ambulates without an assistive device.   IMPRESSION:  1. Status post lumbar decompression and arthrodesis L3-25 December 2001.  2. Chronic low back pain, status post prior hemilaminectomy and partial     facetectomy April 1999.  3. Chronic left lumbar spondylosis.  4. Prior arthroscopic debridement of the right knee x2.  5. Chronic left trochanteric bursitis.  6. Left cervical stenosis C5-6 and foraminal stenosis C6-7.  7. Recent diagnosis of renal cell carcinoma.   In the clinic today, I did refill her hydrocodone 7.5/750 to use two tablets  p.o. t.i.d. p.r.n.  I have given that to her as of February 11, 2004.  I have  also given her a minimal amount of oxycodone to use one to two tablets p.o.  b.i.d. p.r.n. and a total of 40 tablets were prescribed.  That is to be used  only for severe pain not relieved by the hydrocodone.  I have told her that  the plan would be to use that oxycodone only on an as needed basis for a  short time.   We have decided also to obtain liver function tests on her in the office  today.  Those are being drawn at this time and will get back to her if any  abnormalities are noted.  I will plan on seeing the patient in follow-up in approximately two months'  time.      [name], M.D.   DC/MedQ  D:  02/03/2004 13:14:22  T:  02/03/2004 14:09:53  Job #:  [id number]   cc:   Reinaldo Meeker, M.D.  [address]  [place]  [place] [address]  Fax: [phone]

## 2011-03-10 NOTE — Consult Note (Signed)
[name], [name]:  [id number]   MEDICAL RECORD NO.:  [id number]          PATIENT TYPE:  OUT   LOCATION:  GYN                          FACILITY:  [facility]   PHYSICIAN:  [name] T. Kyla Balzarine, M.D.    DATE OF BIRTH:  02/21/1951   DATE OF CONSULTATION:  08/23/2004  DATE OF DISCHARGE:                                   CONSULTATION   CHIEF COMPLAINT:  Malignant potential tumor of the ovary.   HISTORY OF PRESENT ILLNESS:  This patient underwent exploration on July 12, 2004 for an ovarian cyst. She was found to have a cystic mass involving  the left pelvic sidewall, approximately 3 cm in diameter.  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 Frozen  section confirmed a low-grade peritoneal tumor.  The patient had assessment  of the upper abdomen via laparoscopy without evidence of peritoneal  carcinomatosis.   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  The right tube and ovary were histologically  negative.  It should be noted that the patient has a remote history of renal  cancer.   The patient's postoperative convalescence has been relatively slow but  steady.  She has normalized bowel and bladder function and states that her  left pelvic discomfort has markedly improved since surgery.  Incision is  giving her no problems.   She has a history of renal cancer treated with surgery alone and is being  followed with CT scans every 6 months.   PHYSICAL EXAMINATION:  Weight 186 pounds, vital signs stable and afebrile  according to the flow sheet. The patient is alert and oriented x3, in no  acute distress.  The abdominal examination reveals well-healed incision  without erythema, ascites, mass and with minimal tenderness.  No hernia is  present.  Pelvic examination is deferred per patient request.   ASSESSMENT:  Convalescing from BSO and radical peritoneal stripping of LMP  tumor combined with endosalpingiosis.  Remote history of renal cancer.   RECOMMENDATIONS:  I had a long discussion with patient regarding the nature  of her disease.  It likely has been adequately treated with surgery alone,  and her risk of recurrent LMP tumor of the ovary even in the presence of  disseminated endosalpingiosis is exceedingly small. There were no invasive  components and so there is no indication for a need for chemotherapy.  Because she is being followed with intermittent CT scans, I believe it would  be reasonable for her to be followed by Dr. Vincente Poli at 24-month intervals with  general and pelvic examinations  coupled with CA-125 values.  We would be glad to see the patient back for  follow up if there were any concerns regarding findings on follow up.  I  answered questions posed by the patient and she appeared satisfied with  these recommendations.     [name]   JTS/MEDQ  D:  08/23/2004  T:  08/24/2004  Job:  [id number]   cc:   Telford Nab, R.N.  [address]  [place], [place] [address]   Stann Mainland. Vincente Poli, M.D.  [address] [place]  [place] [address]  Fax: [phone]

## 2011-03-10 NOTE — Op Note (Signed)
[facility]  Patient:    [name], [name] Visit Number: [id number] MRN: [id number]          Service Type: SUR Location: [id number] Attending Physician:  Jonne Ply Dictated by:   Stefani Dama, M.D. Proc. 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 Spondylosis L4-5 with left lumbar radiculopathy.  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  OPERATION PERFORMED:  L3 and L4 laminectomy.  Posterior interbody arthrodesis with symmetry implantable bone spacers, fixation with Spinetek 360 pedicle screws and rod system, local autograft.  SURGEON:  Stefani Dama, M.D.  ASSISTANT:  Hewitt Shorts, M.D.  ANESTHESIA:  General endotracheal.  INDICATIONS FOR PROCEDURE:  The patient is a 61 year old individual who has had significant back and leg pain.  She has progressive stenosis at the L3-L4 level demonstrated on a recent MRI from January of 2003.  The patient was advised regarding surgical decompression and stabilization.  DESCRIPTION OF PROCEDURE:  The patient was brought to the operating room supine on the stretcher.  After smooth induction of general endotracheal anesthesia, she was turned prone.  The back was shaved, prepped with DuraPrep and draped in sterile fashion.  An elliptical incision was made around the previous scar and this was excised.  Dissection was carried down to the lumbodorsal fascia which was opened on either side of the midline to expose the spinous processes of L3, L4 and L5.  L4 was identified positively on localizing radiograph.  Then laminectomy of L3 and L4 were completed with the superior arch of L3 being intact.  The facets at L3-4 and L4-5 were then completely removed to well decompress the  common dural tube and identify the take offs of the L3 nerve root superiorly and the L4 nerve roots in the middle and the L5 nerve roots inferiorly.  Dissection of scar and thickened, redundant yellow ligament identified that there was a tight stenosis at L3-4 as noted and a modest stenosis at L4-5 with lateral recess stenosis particularly on the left side.  With this being decompressed, care was taken to retract the dural tube and expose the disk space at L3-4 and L4-5.  Then using a 15 blade to open the ligament, the disk space was entered and all the disk material that could be removed safely through this aperture was removed on both sides at L3-4 and at L4-5.  In the end, the interspaces could be sized and spaced for a 9 mm spacer at L4-5 and an 11 mm spacer at L3-4.  The interbody spacers were then removed and Symmetry bone grafts were placed at L3-4 and L4-5.  These were 12 mm spacers at L3-4 and 10 mm spacers at L4-5. These were countersunk to the appropriate depth and then a combination of the autograft from the laminectomy bone was mixed with 10 cc of ____________ and this was packed into the interspace so as to fill the entire interspace. Pedicle entry sites were chosen at L3, L4 and L5.  Localizing radiograph identified the position of the probes within the pedicle sites and then 6.5 by 45 mm screws were placed in L3, L4 and L5 bilaterally.  The ST360 apparatus was then connected together  using the appropriate sized rod that was precontoured and cut to the appropriate length and then screw caps were applied and tightened and torqued in the required fashion as per the ST360 recommendation.  A final localizing x-ray identified good position of the screws and the rods.  The lateral recess on the left side was then packed with the remaining portions of bone that were had and then the wound was copiously irrigated with antibiotic irrigating solution.  Care was taken to make sure that  the L3, L4 and L5 nerve roots were all free and clear and well decompressed and then hemostasis was achieved in the soft tissues and the lumbodorsal fascia was closed with #1 Vicryl in interrupted fashion.  2-0 Vicryl was used in the subcutaneous and subcuticular tissues.  The patient tolerated the procedure well and was returned to the recovery room in stable condition. Dictated by:   Stefani Dama, M.D. Attending Physician:  Jonne Ply DD:  01/06/02 TD:  01/07/02 Job: [id number]

## 2011-03-10 NOTE — H&P (Signed)
NAME:  [name]                           ACCOUNT NO.:  [id number]   MEDICAL RECORD NO.:  [id number]                   PATIENT TYPE:  INP   LOCATION:  X003                                 FACILITY:  [facility]   PHYSICIAN:  [name], M.D.               DATE OF BIRTH:  Jan 06, 1951   DATE OF ADMISSION:  01/18/2004  DATE OF DISCHARGE:                                HISTORY & PHYSICAL   CHIEF COMPLAINT:  Left renal mass.   HISTORY OF PRESENT ILLNESS:  This is a 60 year old female who has been  followed in the past for a longstanding history of voiding dysfunction.  In  addition, she was also found to have microhematuria.  In addition, she has  been complaining of some vague left lower quadrant pain.  Therefore, she  underwent a contrasted CT scan to evaluate her upper tracts with also her  flank pain.  This demonstrated a 2.2 cm exophytic solid renal mass which was  enhancing coming off the lower pole of the left kidney.  The remainder of  her CT scan as well as examination of her lower tracts by cystoscopy was  negative.  The patient has a normal creatinine level at 0.8.  She denies any  associated symptoms such as fever, chills, night sweats, nausea, vomiting,  chest pain, or shortness of breath.   PAST MEDICAL HISTORY:  1. Chronic low back pain from scoliosis.  2. Migraine headaches.  3. Depression.  4. Hypothyroidism.  5. Hypertension.   PAST SURGICAL HISTORY:  1. Left hip surgery x2.  2. Hysterectomy.  3. Back surgery x2 with fusion.  4. Right shoulder surgery x2.  5. Right knee arthroscopy x2.  6. Tonsillectomy.  7. Appendectomy.  8. 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  9. Tubal ligation.  10.      Surgery to the right foot.   SOCIAL HISTORY:  The patient has a 40 pack-year smoking history but quit  approximately 15 years ago.  She denies any alcohol or illicit drug abuse.   FAMILY HISTORY:  No family history of genitourinary malignancy.   REVIEW OF  SYSTEMS:  As per the history of present illness.  All other  systems reviewed are negative.   MEDICATIONS:  1. Allegra 60 mg daily.  2. Elavil 25 mg q.h.s.  3. Ambien 10 mg q.h.s.  4. Celexa 40 mg daily.  5. Premarin 0.625 mg daily.  6. Synthroid 100 mcg p.o. daily.  7. Soma one p.o. t.i.d.  8. Hydrocodone two p.o. b.i.d.  9. Hydrochlorothiazide 12.5 mg p.o. daily.  10.      Relpax p.r.n. migraines.   ALLERGIES:  AMPICILLIN (rash).   PHYSICAL EXAMINATION:  VITAL SIGNS:  Temperature 98.4, heart rate 74,  respiratory rate 16, blood pressure 126/65, saturations 96%, weight 180  pounds.  GENERAL:  Well-developed, well-nourished, in no apparent distress.  HEENT:  Normocephalic, atraumatic.  Pupils equal, round, and reactive to  light and accommodation.  Extraocular movements are intact.  The oropharynx  is clear and moist.  NECK:  Supple, no lymphadenopathy.  There is no JVD, no carotid bruits  bilaterally.  CARDIOVASCULAR:  Regular rate and rhythm.  No murmurs, gallops, or rubs.  PULMONARY:  Clear to auscultation bilaterally.  ABDOMEN:  Soft, nontender, nondistended, with positive bowel sounds.  Previous right lower quadrant appendectomy incision, well healed.  GENITOURINARY:  Normal-appearing female genitalia.  EXTREMITIES:  No clubbing, cyanosis, or edema.  NEUROLOGIC:  Cranial nerves II-XII intact.  Sensation is grossly nonfocal  and motor is 5/5 throughout.  SKIN:  Integument intact.   IMPRESSION:  This is a 60 year old white female with a history of voiding  dysfunction as well as microhematuria.  On CT scan she has been found to  have a 2.2 cm exophytic enhancing left lower pole renal mass which appears  to be amenable to a partial nephrectomy through a flank incision.  The  risks, benefits, and alternatives of this procedure have been discussed with  the patient and she is willing to proceed; specifically, risks of the  procedure including bleeding, infection, damage to  adjacent structures,  future renal problems, and mortality.   PLAN:  1. She will undergo a left partial nephrectomy today.  The patient has been     consented on the possibility of converting to a left radical nephrectomy     if necessary.  2. Following surgery she will be admitted to the floor for observation and     advancement of her diet.     Thyra Breed, MD                            [name], M.D.    EG/MEDQ  D:  01/18/2004  T:  01/18/2004  Job:  [id number]

## 2011-03-10 NOTE — Assessment & Plan Note (Signed)
MEDICAL RECORD #[id number]   REASON FOR VISIT:  Ms. Manke returns to the clinic today for follow-up  evaluation.  The patient reports that she is getting good relief with her  hydrocodone used two tablets p.o. t.i.d. p.r.n.  She has had a recent refill  as of Janell 9, 2005 but that prescription was for #120.  That will cover her  for approximately 3 weeks and she will need a refill at the end of [place].   The patient reports that she has obtained SSI as of January 06, 2002.  The  finding is retroactive and she will be paid back to that date when she had  her last lumbar surgery.  She also reports that Medicare is due to start  September 2005.   The patient complains of burning persistent pain of her left lower abdomen.  She reportedly only has one ovary left and that is on that side.  She also  has a diagnosis of renal cell carcinoma in the past.  She is planning to see  her OB/GYN and due for an ultrasound Jaquelyne 23, 2005.  We did discuss that she  may still need a CT of her abdomen and pelvis if that ultrasound is  negative.  She does have a history of lumbar decompression and arthrodesis  at L3-5 but the pain is present in her lower abdomen region on the left and  is not consistent with an L3-5 problem.   There has been some concern from the pharmacy about prescriptions filled for  Ms. Recupero.  The prescriptions for oxycodone had been through Dr. Ellin Goodie  office and those were after surgery with him for the renal cell carcinoma.  The patient has not used oxycodone recently and gets all of her pain  medicine through this office.  She is presently maintained on hydrocodone  and Soma and has had refills of medications - specifically those medications  - through this office with other physicians in this office.  She does not  doctor-shop nor obtain any other pain medicines from other sources at the  present time.   MEDICATIONS:  1. Celexa 40 mg daily.  2. Premarin 0.625 mg daily.  3.  Synthroid 100 mcg daily.  4. Allegra daily.  5. Amitriptyline 25 mg q.h.s.  6. Ambien 10 mg q.h.s.  7. Crestor 10 mg daily.  8. Aleve p.r.n.  9. Hydrocodone 7.5/750 two tablets p.o. t.i.d. p.r.n.  10.      Soma 350 mg t.i.d.   PHYSICAL EXAMINATION:  Well-appearing, well-nourished adult female, blood  pressure 111/38 with a pulse of 75 and O2 saturation 98% on room air.  Respirations were 20.  The patient has 5-/5 strength throughout the  bilateral upper and lower extremities.  Bulk and tone were normal and  reflexes were 2+ and symmetrical.  She ambulates without any assistive  device.   IMPRESSION:  1. Status post lumbar decompression and arthrodesis L3-25 December 2001.  2. Chronic low back pain, status post prior hemilaminectomy and partial     facetectomy April 1999.  3. Chronic left lumbar spondylosis.  4. Prior arthroscopic debridement of the right knee x2.  5. Chronic left trochanteric bursitis.  6. Left cervical stenosis C5-6 and foraminal stenosis C6-7.  7. Recent diagnosis of renal cell carcinoma treated by Dr. Isabel Caprice.   In the clinic today I did refill her hydrocodone 7.5/750 two tablets p.o.  t.i.d. p.r.n. as of Metta 28, 2005.  A total of #180 were prescribed and  this  will cover her for 1 month's time using six per day.   She has sufficient Soma at the present time.  We will plan on seeing her in  follow-up in approximately 2-3 months time.  I have asked her to contact  this office if the ultrasound is negative for any acute abnormalities.  We  may need to proceed with a CT of her abdomen and pelvis if that ultrasound  is negative for any acute abnormality.      [name], M.D.   DC/MedQ  D:  04/04/2004 14:16:04  T:  04/04/2004 15:16:06  Job #:  [id number]

## 2011-03-10 NOTE — H&P (Signed)
[name], [name]:  [id number]   MEDICAL RECORD NO.:  [id number]          PATIENT TYPE:  INP   LOCATION:  NA                           FACILITY:  [facility]   PHYSICIAN:  Erasmo Leventhal, M.D.DATE OF BIRTH:  13-Sep-1951   DATE OF ADMISSION:  08/09/2006  DATE OF DISCHARGE:                                HISTORY & PHYSICAL   CHIEF COMPLAINT:  Right knee osteoarthritis.   HISTORY OF PRESENT ILLNESS:  This is a 60 year old lady with a history of  end-stage osteoarthritis of her right knee.  She has failed conservative  management to alleviate her pain and has pain with every step and pain at  night.  Due to her continued symptoms, she wants to proceed with total knee  arthroplasty.  The surgery, risks, benefits and aftercare were discussed in  detail with the patient.  She understands that with her chronic pain history  and her strong history of narcotic use in the past that she will have  significant pain postoperatively and it may be difficult to control her pain  completely but that we should be able to keep it in a tolerable range.  She  understands that.  The surgery will go ahead as scheduled.  The surgery,  risks, benefits and aftercare again were discussed in detail with the  patient.  Questions invited and answered.   PAST MEDICAL HISTORY:  DRUG ALLERGY TO AMPICILLIN WITH A RASH.   CURRENT MEDICATIONS:  1. Premarin 0.65 mg one daily.  2. Phenazopyridine 200 mg b.i.d.  3. Celexa 40 mg daily.  4. Fexofenadine 60 mg b.i.d.  5. Triamterene/hydrochlorothiazide 75/50 one daily.  6. Synthroid 100 mcg daily.  7. Bethanechol 25 mg daily.  8. Xanax 0.25 mg t.i.d.  9. Morphine sulfate 30 mg one t.i.d.  10.Flexeril 10 mg one b.i.d.  11.Elavil 50 mg q.h.s.  12.Ambien 10 mg q.h.s.  13.Soma one t.i.d.   MEDICAL HISTORY:  Positive for hypertension, depression, renal cell  carcinoma, ovarian cancer and chronic pain syndrome.   SURGICAL HISTORY:  Positive  for a left femur fracture, ORIF, tonsillectomy,  appendectomy, lumbar laminectomy, lumbar fusion, knee arthroscopy x3, right  shoulder rotator cuff repair, partial nephrectomy, total hysterectomy and  left finger partial amputation of the index finger.   FAMILY HISTORY:  Positive for cancer.   SOCIAL HISTORY:  The patient is married.  She lives at home.  She is  disabled.  She does not smoke or drink.   REVIEW OF SYSTEMS:  CENTRAL NERVOUS SYSTEM:  Positive for migraine  headaches, otherwise negative.  PULMONARY:  Negative for shortness of  breath, PND and orthopnea. CARDIOVASCULAR:  Negative for chest pain or  palpitation.  GI:  Negative for ulcers or hepatitis.  GU:  Positive for  history of renal cell cancer with partial nephrectomy and total hysterectomy  for ovarian cancer.   PHYSICAL EXAMINATION:  VITAL SIGNS:  BP 122/72, respirations 16, pulse 68  and regular.  GENERAL APPEARANCE:  This is a well-developed, well-nourished lady in no  acute distress.  HEENT:  Head normocephalic.  Nose patent.  Ears patent.  Pupils equal,  round, react to light.  Throat without injection.  NECK:  Supple without adenopathy.  Carotids 2+ without bruit.  CHEST:  Clear to auscultation.  No rales or rhonchi. Respirations 16.  HEART:  Regular rate and rhythm at 68 beats per minute without murmur.  ABDOMEN:  Soft with active bowel sounds.  No masses or organomegaly.  Old  scar secondary to her hysterectomy and partial nephrectomy.  NEUROLOGIC:  Patient alert and oriented to time, place and person.  Cranial  nerves II-XII grossly intact.  EXTREMITIES:  Show the right knee with a slight valgus deformity.  Negative  3-135 degree range of motion.  Dorsalis pedis and posterior tibialis pulses  are 2+.   X-RAYS:  X-rays show end-stage osteoarthritis of the right knee.   IMPRESSION:  End-stage osteoarthritis right knee.   PLAN:  Total knee arthroplasty right knee.      Jaquelyn Bitter. Chabon, P.A.     ______________________________  Erasmo Leventhal, M.D.    SJC/MEDQ  D:  08/01/2006  T:  08/01/2006  Job:  [id number]

## 2011-03-10 NOTE — Discharge Summary (Signed)
[name], WAMPOLE NO.:  [id number]   MEDICAL RECORD NO.:  [id number]          PATIENT TYPE:  INP   LOCATION:  1511                         FACILITY:  [facility]   PHYSICIAN:  Erasmo Leventhal, M.D.DATE OF BIRTH:  [birth year]   DATE OF ADMISSION:  08/09/2006  DATE OF DISCHARGE:  08/12/2006                                 DISCHARGE SUMMARY   ADMITTING DIAGNOSIS:  End-stage osteoarthritis of the right knee.   DISCHARGE DIAGNOSIS:  End-stage osteoarthritis of the right knee.   OPERATION:  Total knee arthroplasty, right knee.   BRIEF HISTORY:  This 60 year old lady with history of end-stage  osteoarthritis of the right knee had failed conservative management to  alleviate her pain. After discussion of treatment options, the patient was  scheduled for total knee arthroplasty.  Surgery, risks, benefits and  aftercare were discussed in detail with the patient. Questions invited and  answered.   LABORATORY VALUES:  Admission CBC showed hemoglobin low at 11.7, hematocrit  low at 33.9, RDW high at 15.9, otherwise normal.  Hemoglobin and hematocrit  reached a low of 9.5, and 27.6, respectively on October 21.  Admission  PT/PTT within normal limits.  INR at discharge 3.7.  Admission CMET showed  CO2 slightly elevated at 34, glucose high at 120, albumin low at 3.1,  otherwise within normal limits.  Glucose ran moderately elevated through  admission at 172 and 167.  Urinalysis within normal limits.   COURSE IN THE HOSPITAL:  The patient tolerated the operative procedure well.  On the first postoperative day, vital signs were stable.  She was afebrile.  I and O was good.  WBC slightly elevated at 10.6. Hemoglobin and hematocrit  were stable.  BMET within normal with exception of glucose at 172.  Dressing  was dry.  Calves were negative.  Drain was removed without difficulty.  Heart sounds and lung sounds were clear and normal, and bowel sounds were  sluggish.  The  patient was started on CPM, physical therapy, and incentive  spirometry q.1 h due to her slightly elevated WBC.  With her chronic pain  history, a consult was obtained with Dr. [name] regarding  postoperative outpatient pain management.   The second postoperative day, vital signs were stable.  Temperature to 01.2  maximum, O2 97% on room air.  I's and O's good. Hemoglobin 99, hematocrit  28.5. Glucose 167, INR 2.2. Heart sounds normal.  Bowel sounds active. Lung  sounds clear.  Dressing was changed.  Wound was benign, and physical therapy  was continued.   On the third postoperative day, the patient was progressing well in therapy,  was having expected discomfort well maintained on her medication regimen  recommended by Dr. Thomasena Edis.  Her vital signs were stable.  She was afebrile.  Hemoglobin 9.5, hematocrit 27.6.  Neurovascular status was intact. The leg  calves were negative.  Wound was benign. After discussion of continued  treatment, the patient would like to be discharged home for followup with  home health, home physical therapy, and this is reasonable and patient  subsequently discharged home.  CONDITION ON DISCHARGE:  Improved.   DISCHARGE MEDICATIONS:  1. Morphine CR 30 mg one p.o. 3 times a day.  2. Morphine IR 1 q. 8 h. p.r.n. breakthrough pain.  3. She will resume her home medications with the exception of anti-      inflammatories and aspirin.  4. She will also take Coumadin per pharmacy protocol for 3 weeks.   Her diet will be Coumadin-restricted diet.  She will weightbear as  tolerated. work with home health, home physical therapy. Follow up in our  office in 2 weeks.  Follow up with Dr. [name] at her regularly  scheduled appointment.  Questions were invited and answered, and the patient  is subsequently discharged home.      Jaquelyn Bitter. Chabon, P.A.    ______________________________  Erasmo Leventhal, M.D.    SJC/MEDQ  D:  08/29/2006   T:  08/29/2006  Job:  [id number]   cc:   [name], M.D.  Fax: [phone]

## 2011-03-10 NOTE — Op Note (Signed)
NAMEJERRIAH, INES NO.:  [id number]   MEDICAL RECORD NO.:  [id number]          PATIENT TYPE:  AMB   LOCATION:  [facility]                         FACILITY:  [facility]   PHYSICIAN:  [name], M.D.  DATE OF BIRTH:  03/09/1951   DATE OF PROCEDURE:  02/13/2005  DATE OF DISCHARGE:                                 OPERATIVE REPORT   PREOPERATIVE DIAGNOSES:  1.  Chronic pelvic pain and dysuria.  2.  Persistent microhematuria.  3.  History of renal cell carcinoma.   POSTOPERATIVE DIAGNOSES:  1.  Chronic pelvic pain and dysuria.  2.  Persistent microhematuria.  3.  History of renal cell carcinoma.   PROCEDURES PERFORMED:  1.  Cystoscopy.  2.  Bilateral retrograde pyelography  3.  Hydraulic overdistention of the bladder.  4.  Instillation of Marcaine.   INDICATIONS:  Ms. Woolen is a 60 year old female.  Approximately a year ago,  she underwent a left flank exploration and was found to have a small renal  cell carcinoma about 2 cm in size.  She underwent a partial nephrectomy and  did well.  She has had several follow up CT scans which have failed to  reveal any evidence of obvious recurrence.  The patient has also been seeing  Dr. 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  Ms. Swalley had a recent CT scan for follow up which did  not reveal any evidence of renal cell carcinoma recurrence since she seems  to be doing well from both malignancies.  The patient, however, has had some  ongoing problems with chronic pelvic and vaginal discomfort.  She has also  had persistent microhematuria, and we have assessed that a couple of times  including office bedside cystoscopy which has failed to reveal anything  significant.  Dr. Vincente Poli had raised the question of possibility of  interstitial cystitis.  We felt that that was a possibility, although we  did  not feel her symptoms were really classic for that.  Given her ongoing  microhematuria as well as her ongoing complaints, we decided to fully  evaluate her urologic status.   TECHNIQUE AND FINDINGS:  The patient was brought to the operating room where  she had successful induction of general anesthesia.  She was placed in  lithotomy position and prepped and draped in the usual manner.  Cystoscopy  revealed an unremarkable urethra and bladder.  I saw no evidence of any  significant pathology.  We performed bilateral retrograde pyelograms which  showed delicate ureters and collecting systems bilaterally without evidence  of obstruction or filling defect.  The patient then underwent hydraulic  overdistention of her bladder for 4-5 minutes at 1 meter of water pressure.  Bladder capacity was measured at 1200 mL.  The patient did not have any  significant glomerular hemorrhaging, and endoscopically she did not have any  findings really consistent with interstitial cystitis.  I did instill some  Marcaine in her bladder to help with  any postop pain issues.  The patient  was brought to recovery room in stable condition.      DSG/MEDQ  D:  02/13/2005  T:  02/13/2005  Job:  [id number]   cc:   Marcelino Duster L. Vincente Poli, M.D.  [address] [place]  [place] [address]  Fax: [phone]

## 2011-03-10 NOTE — Procedures (Signed)
[place]  Patient:    [name], [name]                          MRN: [id number] Proc. Date: 08/07/00 Adm. Date:  [id number] Attending:  Jim Desanctis                           Procedure Report  PROCEDURE:  Left posterior iliac crest bone marrow biopsy aspirate.  DESCRIPTION OF PROCEDURE:  The patient was brought to the short-stay unit. She had an IV placed.  She was placed on a monitor.  We put her over onto her right side.  She was given 6 mg of Versed IV for sedation.  We prepped and draped the left posterior iliac crest region in sterile fashion.  With the use of a spinal needle, we infiltrated lidocaine down to the periosteum.  We were able to obtain a good aspirate biopsy without difficulties.  Throughout there were no complications.  The patient dropped her blood pressure during the procedure but was totally asymptomatic.  Her blood pressure came back with IV hydration. DD:  08/07/00 TD:  08/07/00 Job: [id number]

## 2011-03-10 NOTE — Assessment & Plan Note (Signed)
[name] returns to clinic today for follow-up evaluation.  She has had  subsequent surgeries.  She had a breast reduction done Mar 08, 2006 with Dr.  Odis Luster.  She reports that that helped a lot with decreasing shoulder pain.  She also reports that she fell and broke her left wrist approximately six to  seven weeks ago and was in a cast and then a brace treated by Dr. Shelle Iron.   The patient does need refills on numerous medicines in the office today.  She reports that she gets good relief from her morphine sulfate continuous  release, used 30 mg three times daily along with an extra tablet as needed  up to 10 times throughout the month.  She also uses Soma and Flexeril as  noted below.   MEDICATIONS:  1.  Celexa 40 mg daily.  2.  Premarin 0.625 mg daily.  3.  Synthroid 100 mcg daily.  4.  Allegra daily.  5.  Amitriptyline 25 mg at bedtime.  6.  Ambien 10 mg at bedtime.  7.  Morphine sulfate continuous relief 30 mg three times daily and an      occasional extra tablet daily as needed.  8.  Soma 350 mg three times daily.  9.  Flexeril 10 mg two tablets by mouth at bedtime.  10. Pravachol q.d.  11. Xanax 0.5 mg three times daily as needed.   REVIEW OF SYSTEMS:  Noncontributory.   PHYSICAL EXAMINATION:  GENERAL:  Well appearing, slightly overweight adult  female in mild to no acute discomfort.  VITAL SIGNS:  Blood pressure 115/63 with a pulse of 81, respiratory rate 16  and oxygen saturations 98% on room air.   She has 5-/5 strength throughout the bilateral upper and lower extremities.  She ambulates without any assistive device.   IMPRESSION:  Status post lumbar decompression/arthrodesis, L3 and L5 March  2003.  Chronic low back pain status post prior hemilaminectomy and partial  facetectomy, April 1999.  Chronic lumbar spondylosis.  Left cervical stenosis, C5-C6 and C6-C7 with chronic restless leg syndrome.  History of renal cell carcinoma.  Status post rotator cuff surgery  on the right, October 2006.  Osteoarthritis of the right knee.  Status post breast reduction surgery bilaterally.  Status post fall with left wrist fracture.   In the office today we did fill the Xanax, morphine sulfate, Ambien,  amitriptyline and several medications for her.  She has refills on all but  the Xanax and morphine sulfate continuous release.  We will plan to see her  in follow-up in approximately four months with refills prior to that  appointment as necessary.           ______________________________  [name], M.D.     DC/MedQ  D:  05/10/2006 10:39:56  T:  05/10/2006 12:43:43  Job #:  [id number]

## 2011-03-10 NOTE — Assessment & Plan Note (Signed)
[name] returned to clinic today for followup evaluation.  She has  undergone vertebroplasty for compression fracture in her back with Dr.  Danielle Dess.  She gained minimal relief and subsequently had radiating pain  around her trunk.  She had an injection with Dr. Danielle Dess that helped only  minimally.  She was started on hydrocodone, approximately 8 tablets per  day of 5/500 strength.  She reports that when she takes that with the  morphine she gets better relief, and she would like to have that filled  through this office.  She reports that she had a melanoma resected with  Dr. Yetta Barre, a local dermatologist, over the past few weeks.  She does  need refills on numerous medicines in the office today.   MEDICATIONS:  1. Celexa 40 mg daily.  2. Premarin 0.625 mg daily.  3. Synthroid 100 mcg daily.  4. Allegra daily.  5. Amitriptyline 25 mg nightly.  6. Ambien 10 mg nightly.  7. Morphine sulfate, continuous-release, 30 mg q.i.d.  8. Soma 350 mg t.i.d. p.r.n.  9. Flexeril 10 mg 2 tablets p.o. nightly.  10.Pravachol daily.  11.Xanax 0.5 mg t.i.d. p.r.n.   REVIEW OF SYSTEMS:  Noncontributory.   PHYSICAL EXAMINATION:  GENERAL:  Reasonably well appearing, slightly  overweight, adult female in mild to no acute discomfort.  VITAL SIGNS:  Blood pressure and vitals were not obtained in the office  today.  MUSCULOSKELETAL:  She has 5-/5 strength throughout the bilateral upper  and lower extremities.   IMPRESSION:  1. Status post lumbar decompression/arthrodesis, L3-L5, March 2003.  2. Chronic low back pain, status post hemilaminectomy and partial      facetectomy, April 1999.  3. Chronic lumbar spondylosis.  4. Left cervical stenosis, C5-6 and C6-7.  5. History of renal cell carcinoma.  6. Status post rotator cuff surgery of the right upper extremity,      October 2006.  7. Status post right total knee replacement, October 2007.  8. Status post breast reduction surgery bilaterally.  9.  Status post fall with left wrist fracture.  10.Status post melanoma resection.  11.Status post kyphoplasty.   In the office today we did refill the patient's amitriptyline, Ambien,  Flexeril, and morphine.  We also changed her hydrocodone over to 5/325  one to two tablets p.o. q.i.d. p.r.n.; a total of #240 were prescribed.  Will plan on seeing  the patient in followup in this office in approximately 2-3 months'  time, with refills prior to that appointment if necessary.           ______________________________  [name], M.D.     DC/MedQ  D:  02/20/2007 10:36:24  T:  02/20/2007 11:25:13  Job #:  [id number]

## 2011-03-10 NOTE — Assessment & Plan Note (Signed)
HISTORY OF PRESENT ILLNESS:  [name] returns to the clinic today for  follow up evaluation.  She reports that overall she is doing well.  She does  still take her Hydrocodone 7.5/750 two tablets p.o. b.i.d. and that was  recently filled at the end of December.  She also takes her Soma  approximately one tablet p.o. t.i.d. p.r.n. and that was also filled at that  time.  In addition, she is on other medications as noted below.  Those are  her main pain medications at the present time.  Some apparently working  better than Flexeril when we tried it in the past.  She is not using Aleve  or Vioxx at the present time.   The patient is attending [employer] weight loss program, and she is working on  flexibility along with walking.  She also does some light weight lifting at  the gym area.   The patient has not worked since March 2003 after last lumbar surgery with  Dr. Danielle Dess.  She is still on long-term disability but is still applying for  [employer] at the present time.   MEDICATIONS:  1. Celexa 40 mg daily.  2. Premarin 0.625 mg daily.  3. Synthroid 100 mcg daily.  4. Allegra daily.  5. Amitriptyline 25 mg q.h.s.  6. Ambien 10 mg q.h.s.  7. Crestor 10 mg daily.  8. Aleve p.r.n.  9. Hydrocodone 7.5/750 two tablets p.o. b.i.d.  10.      Soma 350 mg one tablet p.o. t.i.d. p.r.n.   PHYSICAL EXAMINATION:  GENERAL APPEARANCE:  Well-appearing adult female.  VITAL SIGNS:  Blood pressure 108/60 with a pulse of 82 and O2 saturation 97%  on room air.  NEUROLOGICAL:  She has strength 5- to 5/5 throughout the bilateral upper and  lower extremities.  Bulk and tone were normal and reflex were 2+ and  symmetrical.  Sensation was intact to light touch.  She also has some mild  tenderness in the lower aspect of her lumbar spine near the midline of her  back.  There is no appreciable spasms in the paraspinal musculature.   IMPRESSION:  1. Status post lumbar decompression and arthrodesis L3-25 December 2001.  2. Chronic low back pain, status post prior hemilaminectomy and partial     facetectomy April 1999.  3. Chronic left lumbar spondylosis.  4. Prior arthroscopic debridement of the right knee x2.  5. Chronic left trochanteric bursitis.  6. Left cervical stenosis C5-6 and foraminal stenosis C6-7.   PLAN:  The patient reports that she has sufficient medication with the Soma  having failed October 21, 2003, and the Hydrocodone November 04, 2003.  We  plan on seeing her in follow up for evaluation in approximately 3-4 months.      [name], M.D.   DC/MedQ  D:  11/05/2003 10:39:30  T:  11/05/2003 11:01:23  Job #:  [id number]

## 2011-03-10 NOTE — Op Note (Signed)
[name], [name]                 ACCOUNT NO.:  [id number]   MEDICAL RECORD NO.:  [id number]          PATIENT TYPE:  INP   LOCATION:  0375                         FACILITY:  [facility]   PHYSICIAN:  Michelle L. Grewal, M.D.DATE OF BIRTH:  [birth year]   DATE OF PROCEDURE:  07/12/2004  DATE OF DISCHARGE:                                 OPERATIVE REPORT   PREOPERATIVE DIAGNOSES:  Left lower quadrant pain and left adnexal mass.   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   PROCEDURE:  1.  Diagnostic laparoscopy.  2.  Exploratory laparotomy.  3.  Right salpingo-oophorectomy.  4.  Resection of borderline peritoneal tumor.  5.  Left ureterolysis.  6.  Infracolic omentectomy.   SURGEONS:  1.  Dr. Vincente Poli  2.  Dr. Stanford Breed   ANESTHESIA:  General endotracheal tubal anesthesia.   ESTIMATED BLOOD LOSS:  Less than 100 mL.   DRAINS:  Foley.   PATHOLOGY:  Right tube and ovary and implants on the left pelvic sidewall  overlying the left ureter and the left vaginal cuff and omentum.   DESCRIPTION OF PROCEDURE:  After informed consent was obtained, the patient  was taken to the operating room.  She was intubated in the standard fashion  without any difficulty.  She was prepped and draped in the low lithotomy  position.  A sterile drape was applied.  A Foley catheter was inserted which  was draining clear urine.  Using the scalpel, a small infraumbilical  incision was made, and the Veress needle was inserted without difficulty.  Pneumoperitoneum was achieved with carbon dioxide with a maximum pressure of  15 mmHg.  The Veress needle was removed, and 11 mm trocar was inserted into  the same incision.  The laparoscope was then immediately introduced into the  abdominal cavity, and the patient was placed in Trendelenburg position.  There was no area of bleeding or intestinal injury noted.  A secondary  trocar was  placed under direct visualization using the 5 mm trocar.  This  was placed suprapubically.  We then did a careful exploration of the abdomen  and pelvis.  Upper abdomen appeared normal.  The diaphragmatic surface was  normal as well as the liver surface.  The omentum grossly appeared normal.  She had a right tube and ovary that appeared to be normal.  In the region  where the left ovary and tube had been, there was nothing that was  discernable as a discrete ovary; however, overlying the ureter, there were  small papillations and excrescences that appeared mucus-appearing along the  course of the left ureter.  There also appeared to be some drop mets into  the vaginal cuff over on the left side.  Pelvic washings were obtained.  We  then called Dr. Stanford Breed in for an intraoperative consult.  He took a  look through the laparoscope and agreed with my assessment that this might  possibly be a borderline tumor and recommended laparotomy.  At this point, I  removed the laparoscope, released the pneumoperitoneum,  and converted to  laparotomy by making a vertical skin incision, was carried down to the  fascia.  The rectus muscles were divided in the midline, and the peritoneum  was entered bluntly, and the peritoneal incision was then stretched.  We  then placed an O'Connor-O'Sullivan retractor in the abdominal cavity.  The  large and small bowel were placed in the upper abdomen.  I then removed some  of the implants over the vaginal cuff.  Those were set aside for frozen  section as well as some of the superficial nodular area overlying the left  ureter and along the left pelvic sidewall.  These were sent for frozen  section.  Frozen section returned with revealed that the excrescences along  the vaginal cuff were just peritoneal inclusion cysts; however, the nodular  area that was approximately 1.5-2 cm in diameter was consistent with a  borderline tumor.  While we were waiting for the  frozen section to return, I  had placed a curved Heaney clamp just beneath the right tube and ovary with  careful attention to avoid the ureter, and the specimen was removed using  Mayo scissors, and the vessels were secured using a suture ligature of 0  Vicryl suture and a further free tie of 0 Vicryl suture.  After we received  the information that the nodules were borderline nature, we called Dr.  Stanford Breed back to the operating room and at this point in time, he  scrubbed in, and this portion of the surgery will be dictated by him.  Briefly, what he did was performed a left ureterolysis with my assistance  and then removed the peritoneal surfaces that included the nodular areas and  the excrescences on the left pelvic sidewall.  We then performed an  infracolic omentectomy, and he then performed a thorough exploration of the  upper abdomen and did not note any enlarged lymph nodes.  We then closed the  fascia using #1 loop PDS x 2 and after irrigation of the subcutaneous  layers, the skin was closed with staples.  All sponge, lap, and instrument  counts were correct x 2.  The patient tolerated the procedure extremely  well.  She was extubated and went to the recovery room in stable condition.      MLG/MEDQ  D:  07/12/2004  T:  07/13/2004  Job:  [id number]

## 2011-03-10 NOTE — Assessment & Plan Note (Signed)
PROCEDURE:  Left shoulder injection subacromial bursa.   INDICATIONS:  Subacromial bursitis.  X-rays showed no evidence of  significant OA.   Informed consent was obtained after describing risks and benefits of  this procedure with the patient these include bleeding bruising  infection.  She elects to proceed and has given written consent.  The  patient seated on exam table, area marked and prepped with Betadine and  alcohol, entered with a 25-gauge inch half needle inserted to 0.75 inch  depth.  After negative draw back for blood, 1 mL of 40 mg/mL Kenalog and  4 mL of 1% lidocaine were injected.  The patient tolerated the procedure  well.  Pre and post injection vitals stable.  Post injection  instructions given.      Erick Colace, M.D.  Electronically Signed     AEK/MedQ  D:  07/05/2009 17:03:56  T:  07/06/2009 06:51:59  Job #:  [id number]

## 2011-04-06 ENCOUNTER — Other Ambulatory Visit (HOSPITAL_COMMUNITY): Payer: Self-pay | Admitting: Neurological Surgery

## 2011-04-06 ENCOUNTER — Encounter (HOSPITAL_COMMUNITY)
Admission: RE | Admit: 2011-04-06 | Discharge: 2011-04-06 | Disposition: A | Discharge status: Home / Self Care | Payer: Medicare Other | Source: Ambulatory Visit | Attending: Neurological Surgery | Admitting: Neurological Surgery

## 2011-04-06 ENCOUNTER — Ambulatory Visit (HOSPITAL_COMMUNITY)
Admission: RE | Admit: 2011-04-06 | Discharge: 2011-04-06 | Disposition: A | Discharge status: Home / Self Care | Payer: Medicare Other | Source: Ambulatory Visit | Attending: Neurological Surgery | Admitting: Neurological Surgery

## 2011-04-06 DIAGNOSIS — Z01812 Encounter for preprocedural laboratory examination: Secondary | ICD-10-CM | POA: Insufficient documentation

## 2011-04-06 DIAGNOSIS — M47816 Spondylosis without myelopathy or radiculopathy, lumbar region: Secondary | ICD-10-CM

## 2011-04-06 DIAGNOSIS — Z01818 Encounter for other preprocedural examination: Secondary | ICD-10-CM | POA: Insufficient documentation

## 2011-04-06 DIAGNOSIS — M48061 Spinal stenosis, lumbar region without neurogenic claudication: Secondary | ICD-10-CM

## 2011-04-06 DIAGNOSIS — Z01810 Encounter for preprocedural cardiovascular examination: Secondary | ICD-10-CM | POA: Insufficient documentation

## 2011-04-06 DIAGNOSIS — M47817 Spondylosis without myelopathy or radiculopathy, lumbosacral region: Secondary | ICD-10-CM | POA: Insufficient documentation

## 2011-04-06 LAB — SURGICAL PCR SCREEN
MRSA, PCR: POSITIVE — AB
Staphylococcus aureus: POSITIVE — AB

## 2011-04-06 LAB — CBC
HCT: 34.1 % — ABNORMAL LOW (ref 36.0–46.0)
Hemoglobin: 11 g/dL — ABNORMAL LOW (ref 12.0–15.0)
MCH: 29.1 pg (ref 26.0–34.0)
MCHC: 32.3 g/dL (ref 30.0–36.0)
MCV: 90.2 fL (ref 78.0–100.0)
Platelets: 194 10*3/uL (ref 150–400)
RBC: 3.78 MIL/uL — ABNORMAL LOW (ref 3.87–5.11)
RDW: 15.1 % (ref 11.5–15.5)
WBC: 5.5 10*3/uL (ref 4.0–10.5)

## 2011-04-06 LAB — BASIC METABOLIC PANEL
BUN: 29 mg/dL — ABNORMAL HIGH (ref 6–23)
CO2: 33 mEq/L — ABNORMAL HIGH (ref 19–32)
Calcium: 9.4 mg/dL (ref 8.4–10.5)
Chloride: 100 mEq/L (ref 96–112)
Creatinine, Ser: 0.98 mg/dL (ref 0.4–1.2)
GFR calc Af Amer: >60 mL/min (ref >60)
GFR calc non Af Amer: 58 mL/min — ABNORMAL LOW (ref >60)
Glucose, Bld: 90 mg/dL (ref 70–99)
Potassium: 4.1 mEq/L (ref 3.5–5.1)
Sodium: 141 mEq/L (ref 135–145)

## 2011-04-06 LAB — TYPE AND SCREEN
ABO/RH(D): AB POS
Antibody Screen: NEGATIVE

## 2011-04-10 ENCOUNTER — Inpatient Hospital Stay (HOSPITAL_COMMUNITY): Payer: Medicare Other

## 2011-04-10 ENCOUNTER — Inpatient Hospital Stay (HOSPITAL_COMMUNITY)
Admission: RE | Admit: 2011-04-10 | Discharge: 2011-04-13 | DRG: 460 | Disposition: A | Discharge status: Home / Self Care | Payer: Medicare Other | Source: Ambulatory Visit | Attending: Neurological Surgery | Admitting: Neurological Surgery

## 2011-04-10 DIAGNOSIS — I509 Heart failure, unspecified: Secondary | ICD-10-CM | POA: Diagnosis present

## 2011-04-10 DIAGNOSIS — M47817 Spondylosis without myelopathy or radiculopathy, lumbosacral region: Principal | ICD-10-CM | POA: Diagnosis present

## 2011-04-10 DIAGNOSIS — I10 Essential (primary) hypertension: Secondary | ICD-10-CM | POA: Diagnosis present

## 2011-04-10 DIAGNOSIS — Z981 Arthrodesis status: Secondary | ICD-10-CM

## 2011-04-10 DIAGNOSIS — IMO0002 Reserved for concepts with insufficient information to code with codable children: Secondary | ICD-10-CM | POA: Diagnosis present

## 2011-04-27 NOTE — Op Note (Signed)
[name], [name]:  [id number]  MEDICAL RECORD NO.:  [id number]  LOCATION:  3033                         FACILITY:  [facility]  PHYSICIAN:  Stefani Dama, M.D.  DATE OF BIRTH:  [birth year]  DATE OF PROCEDURE:  04/10/2011 DATE OF DISCHARGE:                              OPERATIVE REPORT   PREOPERATIVE DIAGNOSIS:  Lumbar spondylosis and stenosis at L5-S1 with lumbar radiculopathy at L5-S1 distributions, status post arthrodesis, L3- L5, 10 years ago.  POSTOPERATIVE DIAGNOSIS:  Lumbar spondylosis and stenosis at L5-S1 with lumbar radiculopathy at L5-S1 distributions, status post arthrodesis, L3- L5, 10 years ago.  PROCEDURE:  Decompression of L5-S1 via laminectomy and total diskectomy at L5-S1, posterior lumbar interbody arthrodesis with PEEK spacers, local autograft and allograft L5-S1, add-on pedicle screw fixation at L5- S1, and posterolateral arthrodesis using local autograft and allograft L5-S1.  SURGEON:  Stefani Dama, MD.  FIRST ASSISTANT:  Hewitt Shorts, MD.  ANESTHESIA:  General endotracheal.  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  She had done well until last year to where she had developed increasing back pain, bilateral lower extremity pain, was found to have developed advanced degenerative changes at L5-S1.  She now has a large central disk rupture eccentric to the right side that causes a moderately severe central and lateral recess stenosis.  She has been advised regarding the need for further surgery to decompress and stabilize L5-S1.  DESCRIPTION OF PROCEDURE:  The patient was brought to the operating room, placed supine on the stretcher.  After smooth induction of general endotracheal anesthesia,  she was turned prone and care was taken to appropriately pad and protect all  the bony prominences.  The right arm was placed at her side as she has significant shoulder problems in the past and has the severely limited range of motion of the right shoulder. Back was then prepped with alcohol and DuraPrep and draped in sterile fashion and lower portion of her previously made incision was opened and this was dissected down to lumbodorsal fascia which was opened on either side of midline, and hardware was uncovered on the inferior aspect of it.  There was noted be some evidence of the screw protruding from the bottom of the hardware.  This was more prominent on the right side than on the left.  Dissection was then carried inferiorly to expose the laminar arch of L5 and the L5-S1 junction.  The soft tissues above this area were cleared and then laminotomies were created removing the inferior margin lamina out to including the entirety of the facet at L5- S1 on either side.  Yellow ligament was noted be severely thickened. This was then removed in a piecemeal fashion and this decompressed the central canal and both S1 nerve roots which were severely compromised in the lateral recesses.  Once these areas were decompressed further, the S1 nerve roots could easily be mobilized towards the midline.  The L5 nerve root was then checked for its patency out in the lateral recesses of the foramen.  The disk was noted be severely degenerated in this level and it protruded to either lateral recess.  Diskectomy was then performed by incising the annular ligament with a #15 blade and then removing entirety of the disk from within the interspace at L5-S1 from both sides.  Care was taken to protect the S1 nerve roots in the common dural tube at each of these levels.  Then by exploring the disk space further, we were able to place distracting spacers up to a 10-mm size. The 10-mm size spacer was then packed with local autograft and allograft and Vitoss bone sponge that was soaked in  [employer]ure[employer].  This was then packed into each side of the disk space.  The posterolateral gutters were then exposed and the intertransverse space at L5-S1 was prepared for grafting.  Pedicle screws were then placed at S1.  These were 6.5 x 45 mm sacral screws.  To connect the construct to the previously placed system, add-on connectors from [employer] were used on the right side and the left-sided screws required removal of the L5 screw in order to place an add-on connected there to allow placement of short rod measuring 40 mm in length to connect to the pedicle screw at S1.  Once this hardware was installed and provisionally tightened, radiographically was checked and was found be in good position with good anatomic alignment.  The system was torqued into place, securing the add-on connectors and also the S1 screws to short precontoured rods.  The lateral gutters were then packed with local autograft and allograft.  After this, the nerve roots were inspected and found be free and clear.  Hemostasis was doubly checked in the soft tissues.  Blood loss is minimum, estimated about 300 mL for the entirety of the case, and then the lumbodorsal fascia was closed with #1 Vicryl interrupted fashion, 2-0 Vicryl using subcutaneous tissues, and 3-0 Vicryl subcuticularly.  Dermabond was placed on the skin.  Blood loss for the procedure was estimated 300 mL.     Stefani Dama, M.D.     [name]  D:  04/10/2011  T:  04/11/2011  Job:  [id number]  Electronically Signed by Barnett Abu M.D. on 04/27/2011 05:19:48 PM

## 2011-05-16 ENCOUNTER — Other Ambulatory Visit: Payer: Self-pay | Admitting: Dermatology

## 2011-06-23 ENCOUNTER — Other Ambulatory Visit (HOSPITAL_COMMUNITY): Payer: Self-pay | Admitting: Urology

## 2011-06-23 DIAGNOSIS — C649 Malignant neoplasm of unspecified kidney, except renal pelvis: Secondary | ICD-10-CM

## 2011-06-29 NOTE — Discharge Summary (Signed)
  [name], [name] NO.:  [id number]  MEDICAL RECORD NO.:  [id number]  LOCATION:  3033                         FACILITY:  [facility]  PHYSICIAN:  Stefani Dama, M.D.  DATE OF BIRTH:  1951/04/15  DATE OF ADMISSION:  04/10/2011 DATE OF DISCHARGE:  04/13/2011                              DISCHARGE SUMMARY   ADMITTING DIAGNOSIS:  Lumbar spondylosis and stenosis at L5-S1 with lumbar radiculopathy, status post arthrodesis L3-L5 ten years ago.  DISCHARGE DIAGNOSIS:  Lumbar spondylosis and stenosis at L5-S1 with lumbar radiculopathy, status post arthrodesis L3-L5 ten years ago.  OPERATIONS AND PROCEDURES:  Decompression L5-S1 with posterior lumbar interbody arthrodesis with add-on pedicle screw fixation and posterolateral arthrodesis.  BRIEF HISTORY AND HOSPITAL COURSE:  [name] is a 60 year old female with degenerative spondylolisthesis at L4-5 and spondylosis at L3-4, underwent decompression and fusion.  She has done well until she developed some advanced degenerative changes to adjacent segment at L5- S1, has a large central disk rupture and moderately severe central recess stenosis advised regarding further surgery to decompress and stabilized L5-S1.  She tolerated this procedure well on Brienna 18, 2012, stabilized in recovery room, placed on a PCA pump for pain control. First day postoperatively, she was doing well.  Foley catheter was discontinued, weaned off the PCA pump to p.o. pain medicines.  Started with physical therapy, occupational therapy.  Placed on Toradol for pain control and a bowel regimen.  Second day postoperatively, the patient was feeling better.  Vital signs stable, afebrile.  Continue with physical therapy, ready for discharge home.  On Coco 21, 2012, eating well, voiding well, ambulating safely.  Neurovascularly intact.  Wound benign.  No erythema or drainage.  Vital signs stable, afebrile.  DISCHARGE CONDITION:  Stable,  improved.  DISCHARGE INSTRUCTIONS:  Work with physical therapy on stair work prior to discharge.  Given prescription for Percocet and Valium.  Follow up in 3-4 weeks.  Continue with back precautions.  Contact our office prior to follow up if she has any question or concerns.  Continue with her home medications of: 1. Synthroid 100 mcg daily. 2. MiraLax p.r.n. 3. Cyclobenzaprine 5 mg 3 times a day p.r.n. 4. Wellbutrin SR 150 mg p.o. daily. 5. Triamterene/hydrochlorothiazide 75/50 p.o. b.i.d. 6. Phenergan 25 mg weekly. 7. Multivitamins p.o. daily. 8. Morphine sulfate 30 mg 3 times a day. 9. Hydrocodone 10/500 one p.o. t.i.d. p.r.n. pain or Percocet for     breakthrough pain at this point in time. 10.Folic acid 1 mg daily. 11.Estrogens vaginal cream twice weekly. 12.Valium 5 mg 1 p.o. daily p.r.n. 13.Ambien 10 mg 1 p.o. at bedtime p.r.n. sleep.  Contact our office prior to followup.  All questions encouraged and answered.     [name].   ______________________________ Stefani Dama, M.D.    SCI/MEDQ  D:  06/22/2011  T:  06/22/2011  Job:  [id number]  Electronically Signed by Velora Heckler. on 06/27/2011 09:36:55 AM Electronically Signed by Barnett Abu M.D. on 06/29/2011 03:16:01 PM

## 2011-07-04 ENCOUNTER — Ambulatory Visit (HOSPITAL_COMMUNITY)
Admission: RE | Admit: 2011-07-04 | Discharge: 2011-07-04 | Disposition: A | Discharge status: Home / Self Care | Payer: Medicare Other | Source: Ambulatory Visit | Attending: Urology | Admitting: Urology

## 2011-07-04 DIAGNOSIS — C649 Malignant neoplasm of unspecified kidney, except renal pelvis: Secondary | ICD-10-CM

## 2011-07-04 DIAGNOSIS — K8050 Calculus of bile duct without cholangitis or cholecystitis without obstruction: Secondary | ICD-10-CM | POA: Insufficient documentation

## 2011-07-04 DIAGNOSIS — Z85528 Personal history of other malignant neoplasm of kidney: Secondary | ICD-10-CM | POA: Insufficient documentation

## 2011-07-04 DIAGNOSIS — N289 Disorder of kidney and ureter, unspecified: Secondary | ICD-10-CM | POA: Insufficient documentation

## 2011-07-04 LAB — CREATININE, SERUM
Creatinine, Ser: 1.06 mg/dL (ref 0.50–1.10)
GFR calc Af Amer: >60 mL/min (ref >60)
GFR calc non Af Amer: 53 mL/min — ABNORMAL LOW (ref >60)

## 2011-07-04 MED ORDER — GADOBENATE DIMEGLUMINE 529 MG/ML IV SOLN
17.0000 mL | Freq: Once | INTRAVENOUS | Status: AC | PRN
  Administered 2011-07-04: 17 mL via INTRAVENOUS

## 2011-07-19 LAB — LACTATE DEHYDROGENASE, PLEURAL OR PERITONEAL FLUID: LD, Fluid: 1571 — ABNORMAL HIGH

## 2011-07-19 LAB — BODY FLUID CULTURE: Culture: NO GROWTH

## 2011-07-19 LAB — BASIC METABOLIC PANEL
BUN: 14
BUN: 8
BUN: 8
CO2: 27
CO2: 27
CO2: 28
Calcium: 8.2 — ABNORMAL LOW
Calcium: 8.3 — ABNORMAL LOW
Calcium: 8.4
Chloride: 105
Chloride: 106
Chloride: 107
Creatinine, Ser: 0.67
Creatinine, Ser: 0.71
Creatinine, Ser: 0.77
GFR calc Af Amer: >60
GFR calc Af Amer: >60
GFR calc Af Amer: >60
GFR calc non Af Amer: >60
GFR calc non Af Amer: >60
GFR calc non Af Amer: >60
Glucose, Bld: 110 — ABNORMAL HIGH
Glucose, Bld: 116 — ABNORMAL HIGH
Glucose, Bld: 94
Potassium: 3.9
Potassium: 4
Potassium: 4.4
Sodium: 139
Sodium: 140
Sodium: 142

## 2011-07-19 LAB — COMPREHENSIVE METABOLIC PANEL
ALT: 22
AST: 20
Albumin: 2.1 — ABNORMAL LOW
Alkaline Phosphatase: 93
BUN: 10
CO2: 28
Calcium: 8.2 — ABNORMAL LOW
Chloride: 104
Creatinine, Ser: 0.73
GFR calc Af Amer: >60
GFR calc non Af Amer: >60
Glucose, Bld: 139 — ABNORMAL HIGH
Potassium: 3.3 — ABNORMAL LOW
Sodium: 137
Total Bilirubin: 0.3
Total Protein: 5.4 — ABNORMAL LOW

## 2011-07-19 LAB — BLOOD GAS, ARTERIAL
Acid-Base Excess: 1.4
Bicarbonate: 25.5 — ABNORMAL HIGH
FIO2: 0.21
O2 Saturation: 86.9
Patient temperature: 97.5
TCO2: 26.8
pCO2 arterial: 39.7
pH, Arterial: 7.42 — ABNORMAL HIGH
pO2, Arterial: 50.1 — ABNORMAL LOW

## 2011-07-19 LAB — BODY FLUID CELL COUNT WITH DIFFERENTIAL
Eos, Fluid: 2
Lymphs, Fluid: 47
Monocyte-Macrophage-Serous Fluid: 5 — ABNORMAL LOW
Neutrophil Count, Fluid: 46 — ABNORMAL HIGH
Total Nucleated Cell Count, Fluid: 317

## 2011-07-19 LAB — CBC
HCT: 29.7 — ABNORMAL LOW
HCT: 30.2 — ABNORMAL LOW
Hemoglobin: 10.2 — ABNORMAL LOW
Hemoglobin: 10.3 — ABNORMAL LOW
MCHC: 34.1
MCHC: 34.4
MCV: 91.6
MCV: 93.6
Platelets: 193
Platelets: 195
RBC: 3.23 — ABNORMAL LOW
RBC: 3.24 — ABNORMAL LOW
RDW: 16 — ABNORMAL HIGH
RDW: 16.1 — ABNORMAL HIGH
WBC: 6.4
WBC: 7.3

## 2011-07-19 LAB — GLUCOSE, SEROUS FLUID: Glucose, Fluid: 72

## 2011-07-19 LAB — RHEUMATOID FACTORS, FLUID: Cortisol #1 (Base): 7 IU/mL (ref 0–14)

## 2011-07-19 LAB — PROTEIN, BODY FLUID: Total protein, fluid: 3.8

## 2011-07-19 LAB — AMYLASE, BODY FLUID: Amylase, Fluid: 46

## 2011-07-19 LAB — ANA, BODY FLUID: Anti-Nuclear Ab, IgG: NOT DETECTED

## 2011-07-20 LAB — LUPUS ANTICOAGULANT PANEL
DRVVT: 42.6 (ref 36.1–47.0)
Lupus Anticoagulant: NOT DETECTED
PTT Lupus Anticoagulant: 41 (ref 36.3–48.8)

## 2011-07-20 LAB — IRON AND TIBC
Iron: 220 — ABNORMAL HIGH
UIBC: <55

## 2011-07-20 LAB — CEA
CEA: <0.5
CEA: 3.8

## 2011-07-20 LAB — COMPREHENSIVE METABOLIC PANEL
ALT: 13
ALT: 18
AST: 16
AST: 21
Albumin: 3.3 — ABNORMAL LOW
Albumin: 3.4 — ABNORMAL LOW
Alkaline Phosphatase: 88
Alkaline Phosphatase: 68
BUN: 19
BUN: 26 — ABNORMAL HIGH
CO2: 29
CO2: 32
Calcium: 9.1
Calcium: 8.8
Chloride: 95 — ABNORMAL LOW
Chloride: 100
Creatinine, Ser: 0.96
Creatinine, Ser: 1.24 — ABNORMAL HIGH
GFR calc Af Amer: >60
GFR calc Af Amer: 54 — ABNORMAL LOW
GFR calc non Af Amer: 60 — ABNORMAL LOW
GFR calc non Af Amer: 44 — ABNORMAL LOW
Glucose, Bld: 129 — ABNORMAL HIGH
Glucose, Bld: 105 — ABNORMAL HIGH
Potassium: 2.6 — CL
Potassium: 3.5
Sodium: 134 — ABNORMAL LOW
Sodium: 141
Total Bilirubin: 0.6
Total Bilirubin: 0.6
Total Protein: 7.1
Total Protein: 6.4

## 2011-07-20 LAB — BASIC METABOLIC PANEL
BUN: 24 — ABNORMAL HIGH
BUN: 24 — ABNORMAL HIGH
CO2: 28
CO2: 29
Calcium: 9.1
Calcium: 9.1
Chloride: 99
Chloride: 102
Creatinine, Ser: 1.13
Creatinine, Ser: 1.12
GFR calc Af Amer: 60 — ABNORMAL LOW
GFR calc Af Amer: >60
GFR calc non Af Amer: 49 — ABNORMAL LOW
GFR calc non Af Amer: 50 — ABNORMAL LOW
Glucose, Bld: 111 — ABNORMAL HIGH
Glucose, Bld: 136 — ABNORMAL HIGH
Potassium: 3.6
Potassium: 4.1
Sodium: 138
Sodium: 140

## 2011-07-20 LAB — TSH: TSH: 0.507 (ref 0.350–4.500)

## 2011-07-20 LAB — CBC
HCT: 32.5 — ABNORMAL LOW
HCT: 32 — ABNORMAL LOW
HCT: 34 — ABNORMAL LOW
Hemoglobin: 11.2 — ABNORMAL LOW
Hemoglobin: 10.8 — ABNORMAL LOW
Hemoglobin: 11.7 — ABNORMAL LOW
MCHC: 34.4
MCHC: 33.7
MCHC: 34.5
MCV: 92
MCV: 89.4
MCV: 90.4
Platelets: 440 — ABNORMAL HIGH
Platelets: 171
Platelets: 183
RBC: 3.54 — ABNORMAL LOW
RBC: 3.58 — ABNORMAL LOW
RBC: 3.76 — ABNORMAL LOW
RDW: 15.2
RDW: 15.6 — ABNORMAL HIGH
RDW: 15.8 — ABNORMAL HIGH
WBC: 11.2 — ABNORMAL HIGH
WBC: 5.7
WBC: 6

## 2011-07-20 LAB — PROTIME-INR
INR: 1.2
Prothrombin Time: 15

## 2011-07-20 LAB — DIFFERENTIAL
Basophils Absolute: 0
Basophils Absolute: 0.1
Basophils Relative: 0
Basophils Relative: 1
Eosinophils Absolute: 0.1
Eosinophils Absolute: 0.1
Eosinophils Relative: 1
Eosinophils Relative: 3
Lymphocytes Relative: 23
Lymphocytes Relative: 39
Lymphs Abs: 2.6
Lymphs Abs: 2.3
Monocytes Absolute: 0.9
Monocytes Absolute: 0.4
Monocytes Relative: 8
Monocytes Relative: 7
Neutro Abs: 7.6
Neutro Abs: 3
Neutrophils Relative %: 68
Neutrophils Relative %: 50

## 2011-07-20 LAB — CK TOTAL AND CKMB (NOT AT ARMC)
CK, MB: 1.4
Relative Index: INVALID
Total CK: 49

## 2011-07-20 LAB — SEDIMENTATION RATE
Sed Rate: 30 — ABNORMAL HIGH
Sed Rate: 39 — ABNORMAL HIGH

## 2011-07-20 LAB — C-REACTIVE PROTEIN
CRP: 15.7 — ABNORMAL HIGH (ref <0.6)
CRP: 2.4 — ABNORMAL HIGH (ref <0.6)

## 2011-07-20 LAB — CARDIAC PANEL(CRET KIN+CKTOT+MB+TROPI)
CK, MB: 1.2
Relative Index: INVALID
Total CK: 51
Troponin I: 0.02

## 2011-07-20 LAB — FOLATE: Folate: 6.7

## 2011-07-20 LAB — B-NATRIURETIC PEPTIDE (CONVERTED LAB): Pro B Natriuretic peptide (BNP): 67

## 2011-07-20 LAB — HOMOCYSTEINE: Homocysteine: 17.3 — ABNORMAL HIGH

## 2011-07-20 LAB — LIPASE, BLOOD: Lipase: <10 — ABNORMAL LOW

## 2011-07-20 LAB — TROPONIN I: Troponin I: 0.01

## 2011-07-20 LAB — VITAMIN B12: Vitamin B-12: 358 (ref 211–911)

## 2011-07-20 LAB — POCT CARDIAC MARKERS
CKMB, poc: 2.4
Myoglobin, poc: 122
Operator id: 151321
Troponin i, poc: <0.05

## 2011-07-20 LAB — LACTATE DEHYDROGENASE: LDH: 146

## 2011-07-20 LAB — RETICULOCYTES
RBC.: 3.84 — ABNORMAL LOW
Retic Count, Absolute: 53.8
Retic Ct Pct: 1.4

## 2011-07-20 LAB — ANTI-NEUTROPHIL ANTIBODY

## 2011-07-20 LAB — ANA: Anti Nuclear Antibody(ANA): NEGATIVE

## 2011-07-20 LAB — FERRITIN: Ferritin: 289 (ref 10–291)

## 2011-07-20 LAB — APTT: aPTT: 29

## 2011-08-01 LAB — BASIC METABOLIC PANEL
BUN: 2 — ABNORMAL LOW
BUN: 7
BUN: 20
CO2: 30
CO2: 33 — ABNORMAL HIGH
CO2: 32
Calcium: 7.7 — ABNORMAL LOW
Calcium: 8 — ABNORMAL LOW
Calcium: 9.1
Chloride: 96
Chloride: 99
Chloride: 101
Creatinine, Ser: 0.68
Creatinine, Ser: 0.69
Creatinine, Ser: 0.77
GFR calc Af Amer: >60
GFR calc Af Amer: >60
GFR calc Af Amer: >60
GFR calc non Af Amer: >60
GFR calc non Af Amer: >60
GFR calc non Af Amer: >60
Glucose, Bld: 104 — ABNORMAL HIGH
Glucose, Bld: 144 — ABNORMAL HIGH
Glucose, Bld: 93
Potassium: 3.6
Potassium: 3.7
Potassium: 4.2
Sodium: 133 — ABNORMAL LOW
Sodium: 134 — ABNORMAL LOW
Sodium: 141

## 2011-08-01 LAB — DIFFERENTIAL
Basophils Absolute: 0
Basophils Relative: 0
Eosinophils Absolute: 0 — ABNORMAL LOW
Eosinophils Relative: 0
Lymphocytes Relative: 19
Lymphs Abs: 1.5
Monocytes Absolute: 0.6
Monocytes Relative: 8
Neutro Abs: 5.6
Neutrophils Relative %: 73

## 2011-08-01 LAB — CBC
HCT: 25.5 — ABNORMAL LOW
HCT: 26.2 — ABNORMAL LOW
Hemoglobin: 8.9 — ABNORMAL LOW
Hemoglobin: 9.1 — ABNORMAL LOW
MCHC: 34.9
MCHC: 34.9
MCV: 89.1
MCV: 90.1
Platelets: 151
Platelets: 155
RBC: 2.84 — ABNORMAL LOW
RBC: 2.94 — ABNORMAL LOW
RDW: 13.9
RDW: 13.9
WBC: 6.4
WBC: 7.8

## 2011-08-01 LAB — HEMOGLOBIN AND HEMATOCRIT, BLOOD
HCT: 34.5 — ABNORMAL LOW
Hemoglobin: 11.8 — ABNORMAL LOW

## 2011-08-01 LAB — TYPE AND SCREEN
ABO/RH(D): AB POS
Antibody Screen: NEGATIVE

## 2011-09-29 ENCOUNTER — Other Ambulatory Visit (HOSPITAL_COMMUNITY): Payer: Self-pay | Admitting: Obstetrics and Gynecology

## 2011-09-29 DIAGNOSIS — R1032 Left lower quadrant pain: Secondary | ICD-10-CM

## 2011-10-04 ENCOUNTER — Ambulatory Visit (HOSPITAL_COMMUNITY)
Admission: RE | Admit: 2011-10-04 | Discharge: 2011-10-04 | Disposition: A | Discharge status: Home / Self Care | Payer: Medicare Other | Source: Ambulatory Visit | Attending: Obstetrics and Gynecology | Admitting: Obstetrics and Gynecology

## 2011-10-04 DIAGNOSIS — R1032 Left lower quadrant pain: Secondary | ICD-10-CM | POA: Insufficient documentation

## 2011-10-04 DIAGNOSIS — Z8553 Personal history of malignant neoplasm of renal pelvis: Secondary | ICD-10-CM | POA: Insufficient documentation

## 2011-10-04 DIAGNOSIS — D3000 Benign neoplasm of unspecified kidney: Secondary | ICD-10-CM | POA: Insufficient documentation

## 2011-10-04 DIAGNOSIS — Z90710 Acquired absence of both cervix and uterus: Secondary | ICD-10-CM | POA: Insufficient documentation

## 2011-10-04 MED ORDER — IOHEXOL 300 MG/ML  SOLN: 100.0000 mL | Freq: Once | INTRAMUSCULAR | Status: AC | PRN

## 2011-10-04 MED ORDER — IOHEXOL 300 MG/ML  SOLN
100.0000 mL | Freq: Once | INTRAMUSCULAR | Status: AC | PRN
  Administered 2011-10-04: 100 mL via INTRAVENOUS

## 2011-12-20 ENCOUNTER — Telehealth: Payer: Self-pay

## 2011-12-20 NOTE — Telephone Encounter (Signed)
Please fax refill info to [facility]: RX 5mg  Diazepam. Took last pill today. Please call back with results. Only have pharmacy Phone #: [phone]

## 2011-12-22 NOTE — Telephone Encounter (Signed)
This had already been done on 12/20/2011.

## 2012-01-13 ENCOUNTER — Other Ambulatory Visit: Payer: Self-pay | Admitting: *Deleted

## 2012-01-13 MED ORDER — DIAZEPAM 5 MG PO TABS: 5.0000 mg | ORAL_TABLET | Freq: Two times a day (BID) | ORAL | Status: AC

## 2012-01-17 ENCOUNTER — Other Ambulatory Visit: Payer: Self-pay

## 2012-01-17 MED ORDER — BUPROPION HCL ER (SR) 150 MG PO TB12: 150.0000 mg | ORAL_TABLET | ORAL | Status: DC

## 2012-02-14 ENCOUNTER — Other Ambulatory Visit: Payer: Self-pay | Admitting: Family Medicine

## 2012-02-14 MED ORDER — DIAZEPAM 5 MG PO TABS: 5.0000 mg | ORAL_TABLET | Freq: Two times a day (BID) | ORAL | Status: AC | PRN

## 2012-02-28 ENCOUNTER — Other Ambulatory Visit: Payer: Self-pay | Admitting: Emergency Medicine

## 2012-02-29 NOTE — Telephone Encounter (Signed)
Pt would like to have wellbutrin sent over to her pharmacy asap because she is currently out.

## 2012-03-11 ENCOUNTER — Other Ambulatory Visit: Payer: Self-pay | Admitting: Physician Assistant

## 2012-03-12 ENCOUNTER — Telehealth: Payer: Self-pay

## 2012-03-12 NOTE — Telephone Encounter (Signed)
Patient request refill on Vallium

## 2012-03-13 ENCOUNTER — Other Ambulatory Visit: Payer: Self-pay | Admitting: Family Medicine

## 2012-03-13 MED ORDER — DIAZEPAM 5 MG PO TABS: 5.0000 mg | ORAL_TABLET | Freq: Two times a day (BID) | ORAL | Status: DC | PRN

## 2012-03-13 NOTE — Telephone Encounter (Signed)
Chart already at nurse station 

## 2012-03-13 NOTE — Telephone Encounter (Signed)
Done

## 2012-03-13 NOTE — Telephone Encounter (Signed)
Please pull paper chart.  

## 2012-03-13 NOTE — Telephone Encounter (Signed)
Pt states that she has not has had her vallium in 2 days and would like to know whether or not we could refill it for enough to lat her until her appt on Mar 19, 2012.

## 2012-03-14 ENCOUNTER — Telehealth: Payer: Self-pay

## 2012-03-14 NOTE — Telephone Encounter (Signed)
Pt is not going to have enough of her valium to last to her appt would like to know if we could prescribe her enough to last until then

## 2012-03-15 NOTE — Telephone Encounter (Signed)
Left message on VM of info

## 2012-03-15 NOTE — Telephone Encounter (Signed)
Already done

## 2012-03-19 ENCOUNTER — Encounter: Payer: Self-pay | Admitting: Emergency Medicine

## 2012-03-19 ENCOUNTER — Ambulatory Visit (INDEPENDENT_AMBULATORY_CARE_PROVIDER_SITE_OTHER): Payer: Medicare Other | Admitting: Emergency Medicine

## 2012-03-19 VITALS — BP 122/79 | HR 88 | Temp 96.5°F | Resp 16 | Ht 65.5 in | Wt 197.5 lb

## 2012-03-19 DIAGNOSIS — C689 Malignant neoplasm of urinary organ, unspecified: Secondary | ICD-10-CM

## 2012-03-19 DIAGNOSIS — G8929 Other chronic pain: Secondary | ICD-10-CM

## 2012-03-19 DIAGNOSIS — G4700 Insomnia, unspecified: Secondary | ICD-10-CM

## 2012-03-19 DIAGNOSIS — E039 Hypothyroidism, unspecified: Secondary | ICD-10-CM

## 2012-03-19 DIAGNOSIS — Z79899 Other long term (current) drug therapy: Secondary | ICD-10-CM

## 2012-03-19 DIAGNOSIS — F419 Anxiety disorder, unspecified: Secondary | ICD-10-CM

## 2012-03-19 LAB — CBC WITH DIFFERENTIAL/PLATELET
Basophils Absolute: 0 10*3/uL (ref 0.0–0.1)
Basophils Relative: 1 % (ref 0–1)
Eosinophils Absolute: 0.4 10*3/uL (ref 0.0–0.7)
Eosinophils Relative: 6 % — ABNORMAL HIGH (ref 0–5)
HCT: 34.6 % — ABNORMAL LOW (ref 36.0–46.0)
Hemoglobin: 11.4 g/dL — ABNORMAL LOW (ref 12.0–15.0)
Lymphocytes Relative: 33 % (ref 12–46)
Lymphs Abs: 2.1 10*3/uL (ref 0.7–4.0)
MCH: 29.9 pg (ref 26.0–34.0)
MCHC: 32.9 g/dL (ref 30.0–36.0)
MCV: 90.8 fL (ref 78.0–100.0)
Monocytes Absolute: 0.4 10*3/uL (ref 0.1–1.0)
Monocytes Relative: 7 % (ref 3–12)
Neutro Abs: 3.4 10*3/uL (ref 1.7–7.7)
Neutrophils Relative %: 53 % (ref 43–77)
Platelets: 185 10*3/uL (ref 150–400)
RBC: 3.81 MIL/uL — ABNORMAL LOW (ref 3.87–5.11)
RDW: 14.7 % (ref 11.5–15.5)
WBC: 6.3 10*3/uL (ref 4.0–10.5)

## 2012-03-19 LAB — TSH: TSH: 1.261 u[IU]/mL (ref 0.350–4.500)

## 2012-03-19 LAB — COMPREHENSIVE METABOLIC PANEL
ALT: 25 U/L (ref 0–35)
AST: 30 U/L (ref 0–37)
Albumin: 4.2 g/dL (ref 3.5–5.2)
Alkaline Phosphatase: 103 U/L (ref 39–117)
BUN: 29 mg/dL — ABNORMAL HIGH (ref 6–23)
CO2: 29 mEq/L (ref 19–32)
Calcium: 9.5 mg/dL (ref 8.4–10.5)
Chloride: 102 mEq/L (ref 96–112)
Creat: 1.24 mg/dL — ABNORMAL HIGH (ref 0.50–1.10)
Glucose, Bld: 121 mg/dL — ABNORMAL HIGH (ref 70–99)
Potassium: 3.9 mEq/L (ref 3.5–5.3)
Sodium: 140 mEq/L (ref 135–145)
Total Bilirubin: 0.3 mg/dL (ref 0.3–1.2)
Total Protein: 6.5 g/dL (ref 6.0–8.3)

## 2012-03-19 LAB — T4: T4, Total: 7.8 ug/dL (ref 5.0–12.5)

## 2012-03-19 MED ORDER — LEVOTHYROXINE SODIUM 100 MCG PO TABS: 100.0000 ug | ORAL_TABLET | Freq: Every day | ORAL | Status: DC

## 2012-03-19 MED ORDER — DIAZEPAM 5 MG PO TABS: 5.0000 mg | ORAL_TABLET | Freq: Two times a day (BID) | ORAL | Status: AC | PRN

## 2012-03-19 MED ORDER — ZOLPIDEM TARTRATE 10 MG PO TABS: ORAL_TABLET | ORAL | Status: DC

## 2012-03-19 NOTE — Progress Notes (Signed)
  Subjective:    Patient ID: [name], female    DOB: Mar 03, 1951, 61 y.o.   MRN: [id number]  HPI patient enters for followup of her chronic anxiety chronic pain disorder. All pain medications are prescribed by pain management.She has had 3 malignancies; a melanoma on her back as well as renal cell carcinoma and a premalignant ovarian tumor removal.    Review of Systems     Objective:   Physical Exam  Constitutional: She appears well-developed.  HENT:  Head: Normocephalic.  Eyes: Pupils are equal, round, and reactive to light.  Neck: Neck supple. No thyromegaly present.  Cardiovascular: Normal rate.   Pulmonary/Chest: No respiratory distress. She has no wheezes.  Abdominal: Soft. There is no tenderness.          Assessment & Plan:  Patient stable regarding her anxiety and depression. As noted we did not prescribe any of her pain medications. Her other medications were refilled and lab work was done. She is followed regularly because of her history of 2 previous malignancies and one premalignant lesion. She has a urologist and a gynecologist to follow both of these issues.

## 2012-04-01 ENCOUNTER — Other Ambulatory Visit: Payer: Self-pay | Admitting: Physician Assistant

## 2012-06-10 ENCOUNTER — Other Ambulatory Visit: Payer: Self-pay | Admitting: Physician Assistant

## 2012-06-10 ENCOUNTER — Ambulatory Visit
Admission: RE | Admit: 2012-06-10 | Discharge: 2012-06-10 | Disposition: A | Discharge status: Home / Self Care | Payer: Medicare Other | Source: Ambulatory Visit | Attending: Physician Assistant | Admitting: Physician Assistant

## 2012-06-10 ENCOUNTER — Other Ambulatory Visit (HOSPITAL_COMMUNITY): Payer: Self-pay | Admitting: Urology

## 2012-06-10 DIAGNOSIS — M1710 Unilateral primary osteoarthritis, unspecified knee: Secondary | ICD-10-CM

## 2012-06-10 DIAGNOSIS — C649 Malignant neoplasm of unspecified kidney, except renal pelvis: Secondary | ICD-10-CM

## 2012-06-25 ENCOUNTER — Other Ambulatory Visit: Payer: Self-pay | Admitting: Emergency Medicine

## 2012-06-27 ENCOUNTER — Other Ambulatory Visit (HOSPITAL_COMMUNITY): Payer: Medicare Other

## 2012-06-27 ENCOUNTER — Inpatient Hospital Stay (HOSPITAL_COMMUNITY): Admission: RE | Admit: 2012-06-27 | Payer: Medicare Other | Source: Ambulatory Visit

## 2012-06-27 ENCOUNTER — Telehealth: Payer: Self-pay

## 2012-06-27 DIAGNOSIS — N289 Disorder of kidney and ureter, unspecified: Secondary | ICD-10-CM

## 2012-06-27 NOTE — Telephone Encounter (Signed)
Patient has malignancy of her kidney, has urologist, does she need nephrologist? She is asking, please advise.

## 2012-06-27 NOTE — Telephone Encounter (Signed)
Collagen and let her know her kidney function has been stable over the last year. We would be happy to refer her to a nephrologist but we should probably discuss it at her next office visit. If she has had recent blood work that shows a decline in  kidney function please let me know and I can make the referral sooner

## 2012-06-27 NOTE — Telephone Encounter (Signed)
Pt would like a referral to a kidney doctor she says her levels are high

## 2012-06-27 NOTE — Telephone Encounter (Signed)
I have called patient to advise. She had labs done with Urology Dr Isabel Caprice. Her creat was 2.7. She is sending me the labs tomorrow. Patient is concerned she is in full blown kidney failure. When I get labs I will refer.

## 2012-06-28 ENCOUNTER — Other Ambulatory Visit: Payer: Self-pay | Admitting: Emergency Medicine

## 2012-06-28 DIAGNOSIS — N289 Disorder of kidney and ureter, unspecified: Secondary | ICD-10-CM

## 2012-06-28 NOTE — Telephone Encounter (Signed)
She is aware we are sending for Nephrology. She is on Methotrexate, but was told not to discontinue this she uses it every 7 days/ she uses this for pericarditis, the cardiologist is going to see her today.

## 2012-07-02 ENCOUNTER — Other Ambulatory Visit: Payer: Self-pay | Admitting: Emergency Medicine

## 2012-07-19 ENCOUNTER — Other Ambulatory Visit: Payer: Self-pay | Admitting: Physician Assistant

## 2012-08-27 ENCOUNTER — Telehealth: Payer: Self-pay

## 2012-08-27 MED ORDER — DIAZEPAM 5 MG PO TABS: 5.0000 mg | ORAL_TABLET | Freq: Two times a day (BID) | ORAL | Status: DC | PRN

## 2012-08-27 NOTE — Telephone Encounter (Signed)
Patient advised Rx called in , she has appt with Dr Cleta Alberts on 11/19.

## 2012-08-27 NOTE — Telephone Encounter (Signed)
PT STATES HER PHARMACY HAVE BEEN TRYING TO GET HER DIAZEPAM FILLED AND THEY HAVEN'T HEARD FROM [place]. SHE IS OUT AND NEED HER MEDS PLEASE CALL [phone]    [facility] IN [facility] [place]

## 2012-08-27 NOTE — Telephone Encounter (Signed)
Signed at desk

## 2012-08-27 NOTE — Telephone Encounter (Signed)
Looks like she is due for follow up end of the month. Pended Rx. Please advise.

## 2012-09-10 ENCOUNTER — Encounter: Payer: Self-pay | Admitting: Emergency Medicine

## 2012-09-10 ENCOUNTER — Ambulatory Visit (INDEPENDENT_AMBULATORY_CARE_PROVIDER_SITE_OTHER): Payer: Medicare Other | Admitting: Emergency Medicine

## 2012-09-10 VITALS — BP 100/62 | HR 96 | Temp 98.9°F | Resp 16 | Ht 65.5 in | Wt 159.8 lb

## 2012-09-10 DIAGNOSIS — G4700 Insomnia, unspecified: Secondary | ICD-10-CM

## 2012-09-10 DIAGNOSIS — E039 Hypothyroidism, unspecified: Secondary | ICD-10-CM

## 2012-09-10 DIAGNOSIS — F439 Reaction to severe stress, unspecified: Secondary | ICD-10-CM

## 2012-09-10 DIAGNOSIS — Z23 Encounter for immunization: Secondary | ICD-10-CM

## 2012-09-10 MED ORDER — ZOLPIDEM TARTRATE 5 MG PO TABS: 5.0000 mg | ORAL_TABLET | Freq: Every evening | ORAL | Status: DC | PRN

## 2012-09-10 MED ORDER — LEVOTHYROXINE SODIUM 100 MCG PO TABS: 100.0000 ug | ORAL_TABLET | Freq: Every day | ORAL | Status: DC

## 2012-09-10 MED ORDER — DIAZEPAM 5 MG PO TABS: 5.0000 mg | ORAL_TABLET | Freq: Three times a day (TID) | ORAL | Status: DC | PRN

## 2012-09-10 NOTE — Progress Notes (Signed)
  Subjective:    Patient ID: [name], female    DOB: 03-12-1951, 61 y.o.   MRN: [id number]  HPI patient enters for followup of her chronic pain. She was also found to have renal dysfunction and has been to see the nephrologist. She also has been with Dr. Isabel Caprice. for evaluation of a lesion found in the kidney. She is scheduled to have a followup CT in one year overall she is doing fairly well. She's under a lot of stress at home she continues to go to pain management. She is requesting a mild increase her Valium to 3 times a day.    Review of Systems     Objective:   Physical Exam patient is alert cooperative in no distress. She has had a significant weight loss by intention. Neck is supple. Chest clear. Heart regular rate no murmurs. Abdomen soft no tenderness .        Assessment & Plan:  I did increase her Valium to 5 mg 3 times a day. I let him be in the same. We do not write for her pain medications as she goes to pain management for this. We'll followup in about 4 months.

## 2012-11-18 ENCOUNTER — Telehealth: Payer: Self-pay | Admitting: *Deleted

## 2012-11-18 MED ORDER — BUPROPION HCL ER (SR) 150 MG PO TB12: 150.0000 mg | ORAL_TABLET | Freq: Every day | ORAL | Status: DC

## 2012-11-18 NOTE — Telephone Encounter (Signed)
[facility] requesting refill on bupropion sr 150mg .  Last fill 10/14/12

## 2012-11-18 NOTE — Telephone Encounter (Signed)
Sent!

## 2012-11-27 ENCOUNTER — Encounter (HOSPITAL_COMMUNITY): Payer: Self-pay | Admitting: Cardiology

## 2012-11-27 ENCOUNTER — Inpatient Hospital Stay (HOSPITAL_COMMUNITY): Payer: Medicare Other

## 2012-11-27 ENCOUNTER — Inpatient Hospital Stay (HOSPITAL_COMMUNITY)
Admission: AD | Admit: 2012-11-27 | Discharge: 2012-11-28 | DRG: 313 | Disposition: A | Discharge status: Home / Self Care | Payer: Medicare Other | Source: Ambulatory Visit | Attending: Internal Medicine | Admitting: Internal Medicine

## 2012-11-27 DIAGNOSIS — Z905 Acquired absence of kidney: Secondary | ICD-10-CM

## 2012-11-27 DIAGNOSIS — Z8543 Personal history of malignant neoplasm of ovary: Secondary | ICD-10-CM

## 2012-11-27 DIAGNOSIS — Z85820 Personal history of malignant melanoma of skin: Secondary | ICD-10-CM

## 2012-11-27 DIAGNOSIS — R0600 Dyspnea, unspecified: Secondary | ICD-10-CM | POA: Diagnosis present

## 2012-11-27 DIAGNOSIS — G40909 Epilepsy, unspecified, not intractable, without status epilepticus: Secondary | ICD-10-CM | POA: Diagnosis present

## 2012-11-27 DIAGNOSIS — F419 Anxiety disorder, unspecified: Secondary | ICD-10-CM | POA: Diagnosis present

## 2012-11-27 DIAGNOSIS — E039 Hypothyroidism, unspecified: Secondary | ICD-10-CM | POA: Diagnosis present

## 2012-11-27 DIAGNOSIS — R0789 Other chest pain: Principal | ICD-10-CM | POA: Diagnosis present

## 2012-11-27 DIAGNOSIS — Z87891 Personal history of nicotine dependence: Secondary | ICD-10-CM

## 2012-11-27 DIAGNOSIS — I872 Venous insufficiency (chronic) (peripheral): Secondary | ICD-10-CM | POA: Diagnosis present

## 2012-11-27 DIAGNOSIS — Z79899 Other long term (current) drug therapy: Secondary | ICD-10-CM

## 2012-11-27 DIAGNOSIS — Z7982 Long term (current) use of aspirin: Secondary | ICD-10-CM

## 2012-11-27 DIAGNOSIS — G8929 Other chronic pain: Secondary | ICD-10-CM | POA: Diagnosis present

## 2012-11-27 DIAGNOSIS — Z8679 Personal history of other diseases of the circulatory system: Secondary | ICD-10-CM

## 2012-11-27 DIAGNOSIS — G4700 Insomnia, unspecified: Secondary | ICD-10-CM

## 2012-11-27 DIAGNOSIS — Z96659 Presence of unspecified artificial knee joint: Secondary | ICD-10-CM

## 2012-11-27 DIAGNOSIS — R079 Chest pain, unspecified: Secondary | ICD-10-CM | POA: Diagnosis present

## 2012-11-27 DIAGNOSIS — M549 Dorsalgia, unspecified: Secondary | ICD-10-CM | POA: Diagnosis present

## 2012-11-27 DIAGNOSIS — F439 Reaction to severe stress, unspecified: Secondary | ICD-10-CM

## 2012-11-27 DIAGNOSIS — R0602 Shortness of breath: Secondary | ICD-10-CM | POA: Diagnosis present

## 2012-11-27 HISTORY — DX: Personal history of malignant neoplasm of ovary: Z85.43

## 2012-11-27 HISTORY — DX: Personal history of other diseases of the circulatory system: Z86.79

## 2012-11-27 HISTORY — DX: Hypothyroidism, unspecified: E03.9

## 2012-11-27 HISTORY — DX: Fibromyalgia: M79.7

## 2012-11-27 HISTORY — DX: Venous insufficiency (chronic) (peripheral): I87.2

## 2012-11-27 HISTORY — DX: Chest pain, unspecified: R07.9

## 2012-11-27 LAB — PRO B NATRIURETIC PEPTIDE: Pro B Natriuretic peptide (BNP): 99.4 pg/mL (ref 0–125)

## 2012-11-27 LAB — CBC WITH DIFFERENTIAL/PLATELET
Basophils Absolute: 0 10*3/uL (ref 0.0–0.1)
Basophils Relative: 0 % (ref 0–1)
Eosinophils Absolute: 0.1 10*3/uL (ref 0.0–0.7)
Eosinophils Relative: 2 % (ref 0–5)
HCT: 31.4 % — ABNORMAL LOW (ref 36.0–46.0)
Hemoglobin: 10.6 g/dL — ABNORMAL LOW (ref 12.0–15.0)
Lymphocytes Relative: 21 % (ref 12–46)
Lymphs Abs: 1.4 10*3/uL (ref 0.7–4.0)
MCH: 31.3 pg (ref 26.0–34.0)
MCHC: 33.8 g/dL (ref 30.0–36.0)
MCV: 92.6 fL (ref 78.0–100.0)
Monocytes Absolute: 0.1 10*3/uL (ref 0.1–1.0)
Monocytes Relative: 1 % — ABNORMAL LOW (ref 3–12)
Neutro Abs: 5.3 10*3/uL (ref 1.7–7.7)
Neutrophils Relative %: 77 % (ref 43–77)
Platelets: 175 10*3/uL (ref 150–400)
RBC: 3.39 MIL/uL — ABNORMAL LOW (ref 3.87–5.11)
RDW: 14 % (ref 11.5–15.5)
WBC: 6.9 10*3/uL (ref 4.0–10.5)

## 2012-11-27 LAB — COMPREHENSIVE METABOLIC PANEL
ALT: 17 U/L (ref 0–35)
AST: 20 U/L (ref 0–37)
Albumin: 3.4 g/dL — ABNORMAL LOW (ref 3.5–5.2)
Alkaline Phosphatase: 77 U/L (ref 39–117)
BUN: 32 mg/dL — ABNORMAL HIGH (ref 6–23)
CO2: 29 mEq/L (ref 19–32)
Calcium: 9.5 mg/dL (ref 8.4–10.5)
Chloride: 96 mEq/L (ref 96–112)
Creatinine, Ser: 1.27 mg/dL — ABNORMAL HIGH (ref 0.50–1.10)
GFR calc Af Amer: 51 mL/min — ABNORMAL LOW (ref >90)
GFR calc non Af Amer: 44 mL/min — ABNORMAL LOW (ref >90)
Glucose, Bld: 207 mg/dL — ABNORMAL HIGH (ref 70–99)
Potassium: 3.7 mEq/L (ref 3.5–5.1)
Sodium: 137 mEq/L (ref 135–145)
Total Bilirubin: 0.2 mg/dL — ABNORMAL LOW (ref 0.3–1.2)
Total Protein: 6.9 g/dL (ref 6.0–8.3)

## 2012-11-27 LAB — D-DIMER, QUANTITATIVE: D-Dimer, Quant: 1.95 ug/mL-FEU — ABNORMAL HIGH (ref 0.00–0.48)

## 2012-11-27 LAB — TROPONIN I
Troponin I: <0.3 ng/mL (ref <0.30)
Troponin I: <0.3 ng/mL (ref <0.30)

## 2012-11-27 LAB — APTT: aPTT: 39 seconds — ABNORMAL HIGH (ref 24–37)

## 2012-11-27 LAB — TSH: TSH: 0.345 u[IU]/mL — ABNORMAL LOW (ref 0.350–4.500)

## 2012-11-27 LAB — MRSA PCR SCREENING: MRSA by PCR: POSITIVE — AB

## 2012-11-27 LAB — PROTIME-INR
INR: 1.05 (ref 0.00–1.49)
Prothrombin Time: 13.6 seconds (ref 11.6–15.2)

## 2012-11-27 LAB — MAGNESIUM: Magnesium: 1.7 mg/dL (ref 1.5–2.5)

## 2012-11-27 LAB — SEDIMENTATION RATE: Sed Rate: 84 mm/hr — ABNORMAL HIGH (ref 0–22)

## 2012-11-27 MED ORDER — IOHEXOL 350 MG/ML SOLN
100.0000 mL | Freq: Once | INTRAVENOUS | Status: AC | PRN
  Administered 2012-11-27: 100 mL via INTRAVENOUS

## 2012-11-27 MED ORDER — ATORVASTATIN CALCIUM 10 MG PO TABS
10.0000 mg | ORAL_TABLET | Freq: Every day | ORAL | Status: DC
  Administered 2012-11-27: 10 mg via ORAL
  Filled 2012-11-27 (×2): qty 1

## 2012-11-27 MED ORDER — ASPIRIN EC 81 MG PO TBEC: 81.0000 mg | DELAYED_RELEASE_TABLET | Freq: Every day | ORAL | Status: DC

## 2012-11-27 MED ORDER — ACETAMINOPHEN 325 MG PO TABS: 650.0000 mg | ORAL_TABLET | ORAL | Status: DC | PRN

## 2012-11-27 MED ORDER — KETOROLAC TROMETHAMINE 30 MG/ML IJ SOLN
30.0000 mg | Freq: Four times a day (QID) | INTRAMUSCULAR | Status: DC | PRN
  Administered 2012-11-27 – 2012-11-28 (×2): 30 mg via INTRAVENOUS
  Filled 2012-11-27: qty 1

## 2012-11-27 MED ORDER — METOPROLOL TARTRATE 12.5 MG HALF TABLET
12.5000 mg | ORAL_TABLET | Freq: Two times a day (BID) | ORAL | Status: DC
  Administered 2012-11-27 – 2012-11-28 (×2): 12.5 mg via ORAL
  Filled 2012-11-27 (×3): qty 1

## 2012-11-27 MED ORDER — ASPIRIN 300 MG RE SUPP
300.0000 mg | RECTAL | Status: AC
  Filled 2012-11-27: qty 1

## 2012-11-27 MED ORDER — SODIUM CHLORIDE 0.9 % IV SOLN
INTRAVENOUS | Status: DC
  Administered 2012-11-27 – 2012-11-28 (×2): via INTRAVENOUS

## 2012-11-27 MED ORDER — ONDANSETRON HCL 4 MG/2ML IJ SOLN: 4.0000 mg | Freq: Four times a day (QID) | INTRAMUSCULAR | Status: DC | PRN

## 2012-11-27 MED ORDER — BUPROPION HCL ER (SR) 150 MG PO TB12
150.0000 mg | ORAL_TABLET | Freq: Every day | ORAL | Status: DC
  Administered 2012-11-27 – 2012-11-28 (×2): 150 mg via ORAL
  Filled 2012-11-27 (×2): qty 1

## 2012-11-27 MED ORDER — KETOROLAC TROMETHAMINE 30 MG/ML IJ SOLN
INTRAMUSCULAR | Status: AC
  Administered 2012-11-27: 30 mg via INTRAVENOUS
  Filled 2012-11-27: qty 1

## 2012-11-27 MED ORDER — LEVOTHYROXINE SODIUM 100 MCG PO TABS
100.0000 ug | ORAL_TABLET | Freq: Every day | ORAL | Status: DC
  Administered 2012-11-28: 100 ug via ORAL
  Filled 2012-11-27 (×2): qty 1

## 2012-11-27 MED ORDER — NITROGLYCERIN 0.4 MG SL SUBL: 0.4000 mg | SUBLINGUAL_TABLET | SUBLINGUAL | Status: DC | PRN

## 2012-11-27 MED ORDER — MORPHINE SULFATE 15 MG PO TABS
30.0000 mg | ORAL_TABLET | Freq: Two times a day (BID) | ORAL | Status: DC
  Administered 2012-11-27 – 2012-11-28 (×2): 30 mg via ORAL
  Filled 2012-11-27 (×2): qty 1
  Filled 2012-11-27: qty 2

## 2012-11-27 MED ORDER — B COMPLEX-C PO TABS
1.0000 | ORAL_TABLET | Freq: Every day | ORAL | Status: DC
  Administered 2012-11-27 – 2012-11-28 (×2): 1 via ORAL
  Filled 2012-11-27 (×2): qty 1

## 2012-11-27 MED ORDER — HYDROCODONE-ACETAMINOPHEN 10-325 MG PO TABS: 1.0000 | ORAL_TABLET | Freq: Three times a day (TID) | ORAL | Status: DC | PRN

## 2012-11-27 MED ORDER — DIAZEPAM 5 MG PO TABS
5.0000 mg | ORAL_TABLET | Freq: Two times a day (BID) | ORAL | Status: DC | PRN
  Administered 2012-11-28: 5 mg via ORAL
  Filled 2012-11-27: qty 1

## 2012-11-27 MED ORDER — FOLIC ACID 1 MG PO TABS
1.0000 mg | ORAL_TABLET | Freq: Every day | ORAL | Status: DC
  Administered 2012-11-27 – 2012-11-28 (×2): 1 mg via ORAL
  Filled 2012-11-27 (×2): qty 1

## 2012-11-27 MED ORDER — ASPIRIN 81 MG PO CHEW
324.0000 mg | CHEWABLE_TABLET | ORAL | Status: AC
  Administered 2012-11-27: 324 mg via ORAL
  Filled 2012-11-27: qty 4

## 2012-11-27 MED ORDER — MORPHINE SULFATE 4 MG/ML IJ SOLN
4.0000 mg | INTRAMUSCULAR | Status: DC | PRN
  Administered 2012-11-27: 4 mg via INTRAVENOUS
  Filled 2012-11-27: qty 1

## 2012-11-27 MED ORDER — ZOLPIDEM TARTRATE 5 MG PO TABS
10.0000 mg | ORAL_TABLET | Freq: Every evening | ORAL | Status: DC | PRN
  Administered 2012-11-27: 10 mg via ORAL
  Filled 2012-11-27 (×2): qty 1

## 2012-11-27 MED ORDER — ASPIRIN EC 81 MG PO TBEC
81.0000 mg | DELAYED_RELEASE_TABLET | Freq: Every day | ORAL | Status: DC
  Administered 2012-11-28: 81 mg via ORAL
  Filled 2012-11-27 (×2): qty 1

## 2012-11-27 MED ORDER — ASPIRIN 81 MG PO TABS: 81.0000 mg | ORAL_TABLET | Freq: Every day | ORAL | Status: DC

## 2012-11-27 MED ORDER — VITAMIN B COMPLEX PO TABS: ORAL_TABLET | Freq: Every day | ORAL | Status: DC

## 2012-11-27 NOTE — H&P (Signed)
Pt. Seen and examined. Agree with the NP/PA-C note as written.  62 yo female with chronic chest pain, pericarditis in the past s/p window with recurrent bouts of chest pain attributable to pericarditis. She is on chronic methotrexate therapy by Dr. Dareen Piano and recently had a dose increase without improvement. She now reports subacute onset increasing shortness of breath (which is actually difficulty taking a deep breath) due to associated pleuritic chest pain. There is pain on palpation over the chest wall and she is reporting 10/10 chest pain (while lying comfortably in bed).  She is on chronic pain medication, including morphine extended release.  EKG shows low voltage, but no acute changes.   Impression: 1. Pleuritic, probably chest wall pain - suspect fibromyalgia flare or CRPS 2. Acute on chronic dyspnea with pleuritic chest pain - r/o PE 3. Progressive weight loss of unknown etiology  Plan: 1. Admitted for pain management. Will try anti-inflammatory toradol IV.  Added IV morphine for breakthrough. Continue MSSR. 2. CTPA to r/o PE, d-dimer. 3. Check ESR/CRP. 4. R/o ACS with enzymes.  Chrystie Nose, MD, [facility] Attending Cardiologist [facility]
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
## 2012-11-28 DIAGNOSIS — F411 Generalized anxiety disorder: Secondary | ICD-10-CM | POA: Insufficient documentation

## 2012-11-28 DIAGNOSIS — G40909 Epilepsy, unspecified, not intractable, without status epilepticus: Secondary | ICD-10-CM | POA: Diagnosis present

## 2012-11-28 DIAGNOSIS — F419 Anxiety disorder, unspecified: Secondary | ICD-10-CM | POA: Diagnosis present

## 2012-11-28 LAB — TROPONIN I: Troponin I: <0.3 ng/mL (ref <0.30)

## 2012-11-28 LAB — C-REACTIVE PROTEIN: CRP: 11.3 mg/dL — ABNORMAL HIGH (ref <0.60)

## 2012-11-28 LAB — CBC
HCT: 28.6 % — ABNORMAL LOW (ref 36.0–46.0)
Hemoglobin: 9.8 g/dL — ABNORMAL LOW (ref 12.0–15.0)
MCH: 31.8 pg (ref 26.0–34.0)
MCHC: 34.3 g/dL (ref 30.0–36.0)
MCV: 92.9 fL (ref 78.0–100.0)
Platelets: 167 10*3/uL (ref 150–400)
RBC: 3.08 MIL/uL — ABNORMAL LOW (ref 3.87–5.11)
RDW: 13.9 % (ref 11.5–15.5)
WBC: 5 10*3/uL (ref 4.0–10.5)

## 2012-11-28 LAB — LIPID PANEL
Cholesterol: 159 mg/dL (ref 0–200)
HDL: 51 mg/dL (ref >39)
LDL Cholesterol: 93 mg/dL (ref 0–99)
Total CHOL/HDL Ratio: 3.1 RATIO
Triglycerides: 74 mg/dL (ref <150)
VLDL: 15 mg/dL (ref 0–40)

## 2012-11-28 LAB — BASIC METABOLIC PANEL
BUN: 35 mg/dL — ABNORMAL HIGH (ref 6–23)
CO2: 29 mEq/L (ref 19–32)
Calcium: 8.9 mg/dL (ref 8.4–10.5)
Chloride: 99 mEq/L (ref 96–112)
Creatinine, Ser: 1.27 mg/dL — ABNORMAL HIGH (ref 0.50–1.10)
GFR calc Af Amer: 51 mL/min — ABNORMAL LOW (ref >90)
GFR calc non Af Amer: 44 mL/min — ABNORMAL LOW (ref >90)
Glucose, Bld: 104 mg/dL — ABNORMAL HIGH (ref 70–99)
Potassium: 4.2 mEq/L (ref 3.5–5.1)
Sodium: 136 mEq/L (ref 135–145)

## 2012-11-28 LAB — HEMOGLOBIN A1C
Hgb A1c MFr Bld: 6 % — ABNORMAL HIGH (ref <5.7)
Mean Plasma Glucose: 126 mg/dL — ABNORMAL HIGH (ref <117)

## 2012-11-28 MED ORDER — ASPIRIN 81 MG PO TBEC: 81.0000 mg | DELAYED_RELEASE_TABLET | Freq: Every day | ORAL | Status: DC

## 2012-11-28 MED ORDER — CHLORHEXIDINE GLUCONATE CLOTH 2 % EX PADS
6.0000 | MEDICATED_PAD | Freq: Every day | CUTANEOUS | Status: DC
  Administered 2012-11-28: 6 via TOPICAL

## 2012-11-28 MED ORDER — MUPIROCIN 2 % EX OINT
1.0000 "application " | TOPICAL_OINTMENT | Freq: Two times a day (BID) | CUTANEOUS | Status: DC
  Administered 2012-11-28: 1 via NASAL
  Filled 2012-11-28: qty 22

## 2012-11-28 MED ORDER — METOPROLOL TARTRATE 12.5 MG HALF TABLET: 12.5000 mg | ORAL_TABLET | Freq: Two times a day (BID) | ORAL | Status: DC

## 2012-11-28 MED ORDER — ACETAMINOPHEN 325 MG PO TABS: 650.0000 mg | ORAL_TABLET | ORAL | Status: DC | PRN

## 2012-11-28 MED ORDER — ATORVASTATIN CALCIUM 10 MG PO TABS: 10.0000 mg | ORAL_TABLET | Freq: Every day | ORAL | Status: DC

## 2012-11-28 NOTE — Progress Notes (Signed)
[facility]  Subjective: Feeling Better today.  Still with some CP.  It does not hurt as much with inspiration.  The toradol helped.  Objective: Vital signs in last 24 hours: Temp:  [97.6 F (36.4 C)-98.2 F (36.8 C)] 97.6 F (36.4 C) (02/06 0714) Pulse Rate:  [62-94] 62  (02/06 0714) Resp:  [20] 20  (02/05 1714) BP: (90-120)/(49-54) 90/50 mmHg (02/06 0714) SpO2:  [98 %-100 %] 100 % (02/06 0714) Weight:  [69.4 kg (153 lb)-73.573 kg (162 lb 3.2 oz)] 73.573 kg (162 lb 3.2 oz) (02/06 0714) Last BM Date: 11/26/12  Intake/Output from previous day: 02/05 0701 - 02/06 0700 In: 280 [P.O.:280] Out: -  Intake/Output this shift:    Medications Current Facility-Administered Medications  Medication Dose Route Frequency Provider Last Rate Last Dose  . 0.9 %  sodium chloride infusion   Intravenous Continuous Nada Boozer, NP 50 mL/hr at 11/27/12 1652    . acetaminophen (TYLENOL) tablet 650 mg  650 mg Oral Q4H PRN Nada Boozer, NP      . aspirin EC tablet 81 mg  81 mg Oral Daily Chrystie Nose, MD      . atorvastatin (LIPITOR) tablet 10 mg  10 mg Oral q1800 Nada Boozer, NP   10 mg at 11/27/12 1748  . B-complex with vitamin C tablet 1 tablet  1 tablet Oral Daily Chrystie Nose, MD   1 tablet at 11/27/12 1748  . buPROPion [facility] SR) 12 hr tablet 150 mg  150 mg Oral Daily Nada Boozer, NP   150 mg at 11/27/12 1748  . diazepam (VALIUM) tablet 5 mg  5 mg Oral Q12H PRN Nada Boozer, NP   5 mg at 11/28/12 0134  . folic acid (FOLVITE) tablet 1 mg  1 mg Oral Daily Nada Boozer, NP   1 mg at 11/27/12 1748  . ketorolac (TORADOL) 30 MG/ML injection 30 mg  30 mg Intravenous Q6H PRN Wilburt Finlay, PA   30 mg at 11/27/12 1658  . levothyroxine (SYNTHROID, LEVOTHROID) tablet 100 mcg  100 mcg Oral QAC breakfast Nada Boozer, NP   100 mcg at 11/28/12 1191  . metoprolol tartrate (LOPRESSOR) tablet 12.5 mg  12.5 mg Oral BID Nada Boozer, NP   12.5 mg at 11/27/12 2240  .  morphine (MSIR) tablet 30 mg  30 mg Oral BID Nada Boozer, NP   30 mg at 11/28/12 0920  . morphine 4 MG/ML injection 4 mg  4 mg Intravenous Q4H PRN Chrystie Nose, MD   4 mg at 11/27/12 2008  . nitroGLYCERIN (NITROSTAT) SL tablet 0.4 mg  0.4 mg Sublingual Q5 Min x 3 PRN Nada Boozer, NP      . ondansetron [facility]) injection 4 mg  4 mg Intravenous Q6H PRN Nada Boozer, NP      . zolpidem (AMBIEN) tablet 10 mg  10 mg Oral QHS PRN Nada Boozer, NP   10 mg at 11/27/12 2240    PE: General appearance: alert, cooperative and no distress Lungs: clear to auscultation bilaterally Heart: regular rate and rhythm, S1, S2 normal, no murmur, click, rub or gallop Extremities: No LEE Pulses: 2+ and symmetric Skin: Warm and dry. Neurologic: Grossly normal  Lab Results:   Basename 11/28/12 0500 11/27/12 1631  WBC 5.0 6.9  HGB 9.8* 10.6*  HCT 28.6* 31.4*  PLT 167 175   BMET  Basename 11/28/12 0500 11/27/12 1631  NA 136 137  K 4.2 3.7  CL 99 96  CO2 29 29  GLUCOSE 104* 207*  BUN 35* 32*  CREATININE 1.27* 1.27*  CALCIUM 8.9 9.5   PT/INR  Basename 11/27/12 1631  LABPROT 13.6  INR 1.05   Cholesterol  Basename 11/28/12 0500  CHOL 159   Cardiac Enzymes Cardiac Panel (last 3 results)  Basename 11/28/12 0410 11/27/12 2140 11/27/12 1631  CKTOTAL -- -- --  CKMB -- -- --  TROPONINI <0.30 <0.30 <0.30  RELINDX -- -- --    Studies/Results: CT ANGIOGRAPHY CHEST  Technique: Multidetector CT imaging of the chest using the  standard protocol during bolus administration of intravenous  contrast. Multiplanar reconstructed images including MIPs were  obtained and reviewed to evaluate the vascular anatomy.  Contrast: OMNIPAQUE IOHEXOL 350 MG/ML SOLN  Comparison: Chest x-ray 11/27/2012.  Findings: Pulmonary arterial opacification is excellent. No focal  filling defects are evident to suggest pulmonary embolus.  The study is not optimized for the evaluation of the aorta of bone  no  aortic injury is evident. Heart size is normal. No significant  pleural or pericardial effusion is present. No significant  mediastinal or axillary adenopathy is evident. Limited imaging of  the upper abdomen is unremarkable.  Lung windows demonstrate patchy airspace disease in the superior  segment of the left lower lobe compatible with atypical pneumonia.  Areas of more dependent atelectasis are present in the inferior  lower lobes bilaterally. No other significant airspace  consolidation is evident. There is no significant nodule or mass.  IMPRESSION:  1. The superior segment left lower lobe airspace disease most  likely represents pneumonia.  2. Moderate dependent atelectasis in the more inferior lower lobes  bilaterally.  3. No evidence for pulmonary embolus.  4. Atherosclerosis of the aorta without evidence for acute injury.  Please see the discussion above.    Assessment/Plan  Principal Problem:  *Chest pain at rest, rule out pericarditis Active Problems:  History of pericarditis, with pericardial window in 2009, and recurrent episodes 2011,2013, treated with methotrexate  SOB (shortness of breath)  Hypothyroidism  History of partial nephrectomy, both rt. and lt  Hx of ovarian cancer  Venous insufficiency  Chronic back pain, hx of lumbar spondylosis and stenosis L5-S1 with hx decompression and total diskectomy  Hx of melanoma of skin  Plan:  Ruled out for MI.   D dimer elevated.  CT angio was negative for PE but suggestive of LLL pna.  Clinically she does not have pna.  WBCs WNL and no obvious symptoms.  Mildly hypotensive.  Can likely DC home today.   LOS: 1 day    HAGER, BRYAN 11/28/2012 9:33 AM   Patient seen and examined. Agree with assessment and plan. Very pleasant 62 yo wf with history of recurrent pericaqrdial, s/p pericardial window and chronic therapy for recurrent chest pain episodes. No recurrent effusion on cxr. ESR and CRP elevated c/w ongoing  inflammation. Pt feels markedly improved following toradol. Non specific T changes on ECG. Nl WBC, no active lung disease on CXR. Pt does have increased muscle tension with knots on trapezius muscle which may be contributing to her discomfort. Prob OK to dc today with outpatient f/u.   Lennette Bihari, MD, [facility] 11/28/2012 11:31 AM

## 2012-11-28 NOTE — Discharge Summary (Signed)
Patient ID: [name],  MRN: [id number], DOB/AGE: Dec 24, 1950 62 y.o.  Admit date: 11/27/2012 Discharge date: 11/28/2012  Primary Care Provider: Dr Cleta Alberts Primary Cardiologist: Dr Rennis Golden  Discharge Diagnoses  Principal Problem:  *Chest pain at rest, believed to be musculoskeletal  Active Problems:  History of pericarditis, with pericardial window in 2009, and recurrent episodes 2011,2013, treated with methotrexate  SOB (shortness of breath)  Hypothyroidism  History of partial nephrectomy, both rt. and lt  Hx of ovarian cancer  Venous insufficiency  Chronic back pain, hx of lumbar spondylosis and stenosis L5-S1 with hx decompression and total diskectomy  Hx of melanoma of skin    Procedures: None   Hospital Course: The patient is a 61 year old female with a history of pericarditis, which is recurrent, and a history of ovarian cancer, status post TAH-BSO, right partial nephrectomy, history of pericarditis and pericardial window in 2009, now on methotrexate being followed by Dr Dareen Piano. In addition, she has a history of varicose veins and mild venous reflux. She was admitted 11/27/12 with chest pain consistent with her previous pericarditis episodes. She was treated with Toradal with relief of her symptoms. There was a suggestion of pneumonia on CXR but this did corollate clinically. CTA was negative for PE. She does have an elevated Sed Rate consistent with ongoing inflammation. Dr Tresa Endo feels she can be discharge 11/28/12.  Discharge Vitals:  Blood pressure 103/44, pulse 74, temperature 97.6 F (36.4 C), temperature source Oral, resp. rate 20, height 5' 5.5" (1.664 m), weight 73.573 kg (162 lb 3.2 oz), SpO2 100.00%.    Labs: Results for orders placed during the hospital encounter of 11/27/12 (from the past 48 hour(s))  SEDIMENTATION RATE     Status: Abnormal   Collection Time   11/27/12  4:27 PM      Component Value Range Comment   Sed Rate 84 (*) 0 - 22 mm/hr   C-REACTIVE PROTEIN      Status: Abnormal   Collection Time   11/27/12  4:27 PM      Component Value Range Comment   CRP 11.3 (*) <0.60 mg/dL   TROPONIN I     Status: Normal   Collection Time   11/27/12  4:31 PM      Component Value Range Comment   Troponin I <0.30  <0.30 ng/mL   PROTIME-INR     Status: Normal   Collection Time   11/27/12  4:31 PM      Component Value Range Comment   Prothrombin Time 13.6  11.6 - 15.2 seconds    INR 1.05  0.00 - 1.49   APTT     Status: Abnormal   Collection Time   11/27/12  4:31 PM      Component Value Range Comment   aPTT 39 (*) 24 - 37 seconds   CBC WITH DIFFERENTIAL     Status: Abnormal   Collection Time   11/27/12  4:31 PM      Component Value Range Comment   WBC 6.9  4.0 - 10.5 K/uL    RBC 3.39 (*) 3.87 - 5.11 MIL/uL    Hemoglobin 10.6 (*) 12.0 - 15.0 g/dL    HCT 02.7 (*) 25.3 - 46.0 %    MCV 92.6  78.0 - 100.0 fL    MCH 31.3  26.0 - 34.0 pg    MCHC 33.8  30.0 - 36.0 g/dL    RDW 66.4  40.3 - 47.4 %    Platelets 175  150 -  400 K/uL    Neutrophils Relative 77  43 - 77 %    Neutro Abs 5.3  1.7 - 7.7 K/uL    Lymphocytes Relative 21  12 - 46 %    Lymphs Abs 1.4  0.7 - 4.0 K/uL    Monocytes Relative 1 (*) 3 - 12 %    Monocytes Absolute 0.1  0.1 - 1.0 K/uL    Eosinophils Relative 2  0 - 5 %    Eosinophils Absolute 0.1  0.0 - 0.7 K/uL    Basophils Relative 0  0 - 1 %    Basophils Absolute 0.0  0.0 - 0.1 K/uL   TSH     Status: Abnormal   Collection Time   11/27/12  4:31 PM      Component Value Range Comment   TSH 0.345 (*) 0.350 - 4.500 uIU/mL   COMPREHENSIVE METABOLIC PANEL     Status: Abnormal   Collection Time   11/27/12  4:31 PM      Component Value Range Comment   Sodium 137  135 - 145 mEq/L    Potassium 3.7  3.5 - 5.1 mEq/L    Chloride 96  96 - 112 mEq/L    CO2 29  19 - 32 mEq/L    Glucose, Bld 207 (*) 70 - 99 mg/dL    BUN 32 (*) 6 - 23 mg/dL    Creatinine, Ser 4.09 (*) 0.50 - 1.10 mg/dL    Calcium 9.5  8.4 - 81.1 mg/dL    Total Protein 6.9  6.0 - 8.3 g/dL     Albumin 3.4 (*) 3.5 - 5.2 g/dL    AST 20  0 - 37 U/L    ALT 17  0 - 35 U/L    Alkaline Phosphatase 77  39 - 117 U/L    Total Bilirubin 0.2 (*) 0.3 - 1.2 mg/dL    GFR calc non Af Amer 44 (*) >90 mL/min    GFR calc Af Amer 51 (*) >90 mL/min   MAGNESIUM     Status: Normal   Collection Time   11/27/12  4:31 PM      Component Value Range Comment   Magnesium 1.7  1.5 - 2.5 mg/dL   PRO B NATRIURETIC PEPTIDE     Status: Normal   Collection Time   11/27/12  4:31 PM      Component Value Range Comment   Pro B Natriuretic peptide (BNP) 99.4  0 - 125 pg/mL   HEMOGLOBIN A1C     Status: Abnormal   Collection Time   11/27/12  4:31 PM      Component Value Range Comment   Hemoglobin A1C 6.0 (*) <5.7 %    Mean Plasma Glucose 126 (*) <117 mg/dL   D-DIMER, QUANTITATIVE     Status: Abnormal   Collection Time   11/27/12  4:31 PM      Component Value Range Comment   D-Dimer, Quant 1.95 (*) 0.00 - 0.48 ug/mL-FEU   MRSA PCR SCREENING     Status: Abnormal   Collection Time   11/27/12  4:32 PM      Component Value Range Comment   MRSA by PCR POSITIVE (*) NEGATIVE   TROPONIN I     Status: Normal   Collection Time   11/27/12  9:40 PM      Component Value Range Comment   Troponin I <0.30  <0.30 ng/mL   TROPONIN I     Status: Normal  Collection Time   11/28/12  4:10 AM      Component Value Range Comment   Troponin I <0.30  <0.30 ng/mL   CBC     Status: Abnormal   Collection Time   11/28/12  5:00 AM      Component Value Range Comment   WBC 5.0  4.0 - 10.5 K/uL    RBC 3.08 (*) 3.87 - 5.11 MIL/uL    Hemoglobin 9.8 (*) 12.0 - 15.0 g/dL    HCT 16.1 (*) 09.6 - 46.0 %    MCV 92.9  78.0 - 100.0 fL    MCH 31.8  26.0 - 34.0 pg    MCHC 34.3  30.0 - 36.0 g/dL    RDW 04.5  40.9 - 81.1 %    Platelets 167  150 - 400 K/uL   BASIC METABOLIC PANEL     Status: Abnormal   Collection Time   11/28/12  5:00 AM      Component Value Range Comment   Sodium 136  135 - 145 mEq/L    Potassium 4.2  3.5 - 5.1 mEq/L    Chloride 99   96 - 112 mEq/L    CO2 29  19 - 32 mEq/L    Glucose, Bld 104 (*) 70 - 99 mg/dL    BUN 35 (*) 6 - 23 mg/dL    Creatinine, Ser 9.14 (*) 0.50 - 1.10 mg/dL    Calcium 8.9  8.4 - 78.2 mg/dL    GFR calc non Af Amer 44 (*) >90 mL/min    GFR calc Af Amer 51 (*) >90 mL/min   LIPID PANEL     Status: Normal   Collection Time   11/28/12  5:00 AM      Component Value Range Comment   Cholesterol 159  0 - 200 mg/dL    Triglycerides 74  <956 mg/dL    HDL 51  >21 mg/dL    Total CHOL/HDL Ratio 3.1      VLDL 15  0 - 40 mg/dL    LDL Cholesterol 93  0 - 99 mg/dL     Disposition:      Follow-up Information    Follow up with [facility], NP. (office call)    Contact information:   [address] [place] [address] [phone]          Discharge Medications:    Medication List     As of 11/28/2012  1:02 PM    TAKE these medications         acetaminophen 325 MG tablet   Commonly known as: TYLENOL   Take 2 tablets (650 mg total) by mouth every 4 (four) hours as needed.      aspirin 81 MG EC tablet   Take 1 tablet (81 mg total) by mouth daily.      atorvastatin 10 MG tablet   Commonly known as: LIPITOR   Take 1 tablet (10 mg total) by mouth daily at 6 PM.      buPROPion 150 MG 12 hr tablet   Commonly known as: WELLBUTRIN SR   Take 1 tablet (150 mg total) by mouth daily.      cyclobenzaprine 5 MG tablet   Commonly known as: FLEXERIL   Take 5 mg by mouth 3 (three) times daily as needed. For spasms      diazepam 5 MG tablet   Commonly known as: VALIUM   Take 5 mg by mouth every 8 (eight)  hours as needed. For anxiety      diclofenac sodium 1 % Gel   Commonly known as: VOLTAREN   Apply 2 g topically as needed. For pain      Diethylpropion HCl CR 75 MG Tb24   Take 75 mg by mouth Daily.      folic acid 1 MG tablet   Commonly known as: FOLVITE   Take 1 mg by mouth daily.      HYDROcodone-acetaminophen 10-325 MG per tablet   Commonly known as: NORCO   Take 1  tablet by mouth every 6 (six) hours as needed. For pain      levothyroxine 100 MCG tablet   Commonly known as: SYNTHROID, LEVOTHROID   Take 1 tablet (100 mcg total) by mouth daily.      Methotrexate (PF) 25 MG/0.4ML Soaj   Inject 25 mg into the skin once a week.      metoprolol tartrate 12.5 mg Tabs   Commonly known as: LOPRESSOR   Take 0.5 tablets (12.5 mg total) by mouth 2 (two) times daily.      morphine 30 MG 12 hr tablet   Commonly known as: MS CONTIN   Take 30 mg by mouth 2 (two) times daily.      multivitamin with minerals Tabs   Take 1 tablet by mouth daily.      rOPINIRole 4 MG tablet   Commonly known as: REQUIP   Take 4 mg by mouth at bedtime.      triamterene-hydrochlorothiazide 75-50 MG per tablet   Commonly known as: MAXZIDE   Take 1 tablet by mouth daily.      VITAMIN B-12 PO   Take 1 tablet by mouth daily.      VITAMIN C PO   Take 1 tablet by mouth daily.      zolpidem 5 MG tablet   Commonly known as: AMBIEN   Take 2.5 mg by mouth at bedtime as needed. For sleep          Duration of Discharge Encounter: Greater than 30 minutes including physician time.  Jolene Provost PA-C 11/28/2012 1:02 PM

## 2012-11-29 NOTE — Progress Notes (Signed)
UR Completed.  [name] [phone] 11/29/2012  

## 2012-12-10 ENCOUNTER — Other Ambulatory Visit (HOSPITAL_COMMUNITY): Payer: Self-pay | Admitting: Cardiology

## 2012-12-10 DIAGNOSIS — I309 Acute pericarditis, unspecified: Secondary | ICD-10-CM

## 2012-12-10 DIAGNOSIS — R079 Chest pain, unspecified: Secondary | ICD-10-CM

## 2012-12-19 ENCOUNTER — Encounter (HOSPITAL_COMMUNITY): Payer: Self-pay

## 2012-12-19 ENCOUNTER — Ambulatory Visit (HOSPITAL_COMMUNITY)
Admission: RE | Admit: 2012-12-19 | Discharge: 2012-12-19 | Disposition: A | Discharge status: Home / Self Care | Payer: Medicare Other | Source: Ambulatory Visit | Attending: Cardiology | Admitting: Cardiology

## 2012-12-19 ENCOUNTER — Ambulatory Visit (HOSPITAL_COMMUNITY)
Admission: RE | Admit: 2012-12-19 | Discharge: 2012-12-19 | Disposition: A | Discharge status: Home / Self Care | Payer: Medicare Other | Source: Ambulatory Visit | Attending: Internal Medicine | Admitting: Internal Medicine

## 2012-12-19 DIAGNOSIS — R0989 Other specified symptoms and signs involving the circulatory and respiratory systems: Secondary | ICD-10-CM | POA: Insufficient documentation

## 2012-12-19 DIAGNOSIS — R5381 Other malaise: Secondary | ICD-10-CM | POA: Insufficient documentation

## 2012-12-19 DIAGNOSIS — E785 Hyperlipidemia, unspecified: Secondary | ICD-10-CM | POA: Insufficient documentation

## 2012-12-19 DIAGNOSIS — R42 Dizziness and giddiness: Secondary | ICD-10-CM | POA: Insufficient documentation

## 2012-12-19 DIAGNOSIS — R079 Chest pain, unspecified: Secondary | ICD-10-CM | POA: Insufficient documentation

## 2012-12-19 DIAGNOSIS — I10 Essential (primary) hypertension: Secondary | ICD-10-CM | POA: Insufficient documentation

## 2012-12-19 DIAGNOSIS — R0609 Other forms of dyspnea: Secondary | ICD-10-CM | POA: Insufficient documentation

## 2012-12-19 DIAGNOSIS — R5383 Other fatigue: Secondary | ICD-10-CM | POA: Insufficient documentation

## 2012-12-19 DIAGNOSIS — I309 Acute pericarditis, unspecified: Secondary | ICD-10-CM

## 2012-12-19 DIAGNOSIS — I739 Peripheral vascular disease, unspecified: Secondary | ICD-10-CM | POA: Insufficient documentation

## 2012-12-19 DIAGNOSIS — R0602 Shortness of breath: Secondary | ICD-10-CM | POA: Insufficient documentation

## 2012-12-19 DIAGNOSIS — R002 Palpitations: Secondary | ICD-10-CM | POA: Insufficient documentation

## 2012-12-19 MED ORDER — REGADENOSON 0.4 MG/5ML IV SOLN
0.4000 mg | Freq: Once | INTRAVENOUS | Status: AC
  Administered 2012-12-19: 0.4 mg via INTRAVENOUS

## 2012-12-19 MED ORDER — TECHNETIUM TC 99M SESTAMIBI GENERIC - CARDIOLITE
30.0000 | Freq: Once | INTRAVENOUS | Status: AC | PRN
  Administered 2012-12-19: 30 via INTRAVENOUS

## 2012-12-19 MED ORDER — TECHNETIUM TC 99M SESTAMIBI GENERIC - CARDIOLITE
10.0000 | Freq: Once | INTRAVENOUS | Status: AC | PRN
  Administered 2012-12-19: 10 via INTRAVENOUS

## 2012-12-19 NOTE — Procedures (Addendum)
[facility] [address] [place] [address] [place] [address] [phone]  Cardiology Nuclear Med Study  [name] is a 62 y.o. female     MRN : [id number]     DOB: 1951/09/23  Procedure Date: 12/19/2012  Nuclear Med Background Indication for Stress Test:  Post Hospital History:  NO PRIOR CARDIAC HISTORY Cardiac Risk Factors: History of Smoking, Hypertension, Lipids and PVD  Symptoms:  Chest Pain, Dizziness, DOE, Fatigue, Palpitations and SOB   Nuclear Pre-Procedure Caffeine/Decaff Intake:  1:00am NPO After: 11 AM   IV Site: R Forearm  IV 0.9% NS with Angio Cath:  22g  Chest Size (in):  N/A  IV Started by: Emmit Pomfret, RN  Height: 5\' 7"  (1.702 m)  Cup Size: DD  BMI:  Body mass index is 24.27 kg/(m^2). Weight:  155 lb (70.308 kg)   Tech Comments:  N/A    Nuclear Med Study 1 or 2 day study: 1 day  Stress Test Type:  Lexiscan  Order Authorizing Provider:  Iantha Fallen Huntington Leverich,MD    Resting Radionuclide: Technetium 30m Sestamibi  Resting Radionuclide Dose: 9.9 mCi   Stress Radionuclide:  Technetium 30m Sestamibi  Stress Radionuclide Dose: 30.8 mCi           Stress Protocol Rest HR: 63 Stress HR: 75  Rest BP: 117/68 Stress BP: 97/53  Exercise Time (min): n/a METS: n/a   Predicted Max HR: 157 bpm % Max HR: 47.77 bpm Rate Pressure Product: 5100  Dose of Adenosine (mg):  n/a Dose of Lexiscan: 0.4 mg  Dose of Atropine (mg): n/a Dose of Dobutamine: n/a mcg/kg/min (at max HR)  Stress Test Technologist: Ernestene Mention, CCT Nuclear Technologist: Koren Shiver, CNMT   Rest Procedure:  Myocardial perfusion imaging was performed at rest 45 minutes following the intravenous administration of Technetium 75m Sestamibi. Stress Procedure:  The patient received IV Lexiscan 0.4 mg over 15-seconds.  Technetium 17m Sestamibi injected at 30-seconds.  There were no significant changes with Lexiscan.  Quantitative spect images were obtained after a 45  minute delay.  Transient Ischemic Dilatation (Normal <1.22):  0.97 Lung/Heart Ratio (Normal <0.45):  0.28 QGS EDV:  67 ml QGS ESV:  21 ml LV Ejection Fraction: 69%  Rest ECG: NSR - Normal EKG  Stress ECG: No significant change from baseline ECG  QPS Raw Data Images:  Mild diaphragmatic attenuation.  Normal left ventricular size. Stress Images:  Normal homogeneous uptake in all areas of the myocardium. Rest Images:  Normal homogeneous uptake in all areas of the myocardium. Subtraction (SDS):  No evidence of ischemia.  Impression Exercise Capacity:  Lexiscan with no exercise. BP Response:  Normal blood pressure response. Clinical Symptoms:  No significant symptoms noted. ECG Impression:  No significant ECG changes with Lexiscan. Comparison with Prior Nuclear Study: No previous nuclear study performed  Overall Impression:  Normal stress nuclear study.  LV Wall Motion:  Wall motion cannot be assessed to problems with gating acquisition. EF was calculated to be 69%.  Chrystie Nose, MD, [facility] Board Certified in Nuclear Cardiology Attending Cardiologist [facility]  Chrystie Nose, MD  12/19/2012 4:54 PM

## 2012-12-31 ENCOUNTER — Telehealth: Payer: Self-pay

## 2012-12-31 ENCOUNTER — Other Ambulatory Visit: Payer: Self-pay | Admitting: Emergency Medicine

## 2012-12-31 DIAGNOSIS — R062 Wheezing: Secondary | ICD-10-CM

## 2012-12-31 MED ORDER — ALBUTEROL SULFATE HFA 108 (90 BASE) MCG/ACT IN AERS: 2.0000 | INHALATION_SPRAY | RESPIRATORY_TRACT | Status: DC | PRN

## 2012-12-31 NOTE — Telephone Encounter (Signed)
Pt would like another inhaler. Said Dr. Cleta Alberts rx'd one for her about a year ago and it really helped her breath. She doesn't remember the name. She has an appt 3/25 but would like inhaler before if possible   [facility]   [phone]

## 2012-12-31 NOTE — Telephone Encounter (Signed)
Need to know name of inhaler she previously had. She does not know and the pharmacy does not know either.

## 2013-01-01 NOTE — Telephone Encounter (Signed)
Patient states this is helping, she got it yesterday.

## 2013-01-01 NOTE — Telephone Encounter (Signed)
Patient advised Dr Cleta Alberts refilled the inhaler.

## 2013-01-07 ENCOUNTER — Other Ambulatory Visit (HOSPITAL_COMMUNITY): Payer: Self-pay | Admitting: Internal Medicine

## 2013-01-07 DIAGNOSIS — M79605 Pain in left leg: Secondary | ICD-10-CM

## 2013-01-08 ENCOUNTER — Encounter (HOSPITAL_COMMUNITY): Payer: Medicare Other

## 2013-01-14 ENCOUNTER — Ambulatory Visit (INDEPENDENT_AMBULATORY_CARE_PROVIDER_SITE_OTHER): Payer: Medicare Other | Admitting: Emergency Medicine

## 2013-01-14 ENCOUNTER — Encounter: Payer: Self-pay | Admitting: Emergency Medicine

## 2013-01-14 ENCOUNTER — Telehealth: Payer: Self-pay | Admitting: Family Medicine

## 2013-01-14 VITALS — BP 144/60 | HR 82 | Temp 98.5°F | Resp 16 | Ht 66.0 in | Wt 151.2 lb

## 2013-01-14 DIAGNOSIS — E039 Hypothyroidism, unspecified: Secondary | ICD-10-CM

## 2013-01-14 DIAGNOSIS — F411 Generalized anxiety disorder: Secondary | ICD-10-CM

## 2013-01-14 DIAGNOSIS — Z8619 Personal history of other infectious and parasitic diseases: Secondary | ICD-10-CM

## 2013-01-14 MED ORDER — DIAZEPAM 5 MG PO TABS: 5.0000 mg | ORAL_TABLET | Freq: Three times a day (TID) | ORAL | Status: DC | PRN

## 2013-01-14 MED ORDER — LEVOTHYROXINE SODIUM 100 MCG PO TABS: 100.0000 ug | ORAL_TABLET | Freq: Every day | ORAL | Status: DC

## 2013-01-14 NOTE — Telephone Encounter (Signed)
Faxed a signed medical release to Dr. Vincenza Hews Anderson's office.

## 2013-01-14 NOTE — Progress Notes (Signed)
  Subjective:    Patient ID: [name], female    DOB: [birth year], 63 y.o.   MRN: [id number]  HPI patient in for refill on her medications. She is a regular patient of Dr. Ewell Poe who sees her for Pericarditis. She also had a recent admission for pericarditis. The cardiologist manage this admission.  She had a CT of the chest during this admission and this turned out normal. She currently doing well. She continues to go to pain management and goes to [facility] for this. She continues to see Dr. Dareen Piano for regular treatment with methotrexate.. She overall is doing well and has lost a significant amount of weight with her diet. .    Review of Systems     Objective:   Physical Exam patient looks good she is in no distress. Her chest is clear. Cardiac exam reveals no rub        Assessment & Plan:  I did go ahead and refill her Synthroid. She states she had blood work done through Dr. Ewell Poe office. Will refill her Valium she takes 5 mg 3 times a day for anxiety and muscle spasm. Refills on this. I also did a swab of her nose for MRSA and that she tested positive for MRSA during her recent hospitalization.

## 2013-01-17 LAB — WOUND CULTURE
Gram Stain: NONE SEEN
Gram Stain: NONE SEEN
Organism ID, Bacteria: NORMAL

## 2013-01-20 ENCOUNTER — Other Ambulatory Visit: Payer: Self-pay | Admitting: Radiology

## 2013-01-20 NOTE — Telephone Encounter (Signed)
Please advise on refill request. pended

## 2013-01-21 ENCOUNTER — Other Ambulatory Visit: Payer: Self-pay | Admitting: Physician Assistant

## 2013-01-21 MED ORDER — BUPROPION HCL ER (SR) 150 MG PO TB12: 150.0000 mg | ORAL_TABLET | Freq: Every day | ORAL | Status: DC

## 2013-01-30 ENCOUNTER — Encounter (HOSPITAL_COMMUNITY): Payer: Medicare Other

## 2013-02-14 ENCOUNTER — Telehealth: Payer: Self-pay

## 2013-02-14 NOTE — Telephone Encounter (Signed)
Patient has been throwing up all night and is wondering if Dr Cleta Alberts can call in medication for her and/or tell her what to do. I told her she would need to be seen and she says she is not able to come in and asked me to put in this message anyway.

## 2013-02-14 NOTE — Telephone Encounter (Signed)
Patient should come in for this. Left message for her to advise, if she has questions she will call me back.

## 2013-02-27 ENCOUNTER — Other Ambulatory Visit: Payer: Self-pay | Admitting: Emergency Medicine

## 2013-02-27 NOTE — Telephone Encounter (Signed)
PATIENT NEEDS REFILL ON AMBIEN. SAYS HER PHARMACY NEVER GOT A REQUEST FROM [place]. USES [facility]. PLEASE CALL PT AT [phone]

## 2013-02-28 ENCOUNTER — Telehealth: Payer: Self-pay

## 2013-02-28 NOTE — Telephone Encounter (Signed)
Pt would like to know why she was prescribed 5 tablets of ambien. Best# [phone]

## 2013-03-01 NOTE — Telephone Encounter (Signed)
Please call patient and let her no that was a mistake in the computer system. Please get the name of her pharmacy and we can call her pharmacy and give her a one-month supply with 5 refills to be filled each month

## 2013-03-02 NOTE — Telephone Encounter (Signed)
Spoke with patient and voiced understanding. She uses [facility] and they are closed on Sundays. Will call tomorrow

## 2013-03-02 NOTE — Telephone Encounter (Signed)
done

## 2013-03-04 ENCOUNTER — Telehealth: Payer: Self-pay

## 2013-03-04 MED ORDER — ZOLPIDEM TARTRATE 5 MG PO TABS: ORAL_TABLET | ORAL | Status: DC

## 2013-03-04 NOTE — Telephone Encounter (Signed)
Pt CB to check status of message. I advised her that Dr Cleta Alberts did authorize [place] to change quantity to #30 as it should have been called in previously. I called [facility] and gave pharmacist corrected Rx.

## 2013-03-04 NOTE — Telephone Encounter (Signed)
She is supposed to be on ambien  5 mg 1 each bedtime #30 with 5 refills

## 2013-03-04 NOTE — Telephone Encounter (Signed)
PT STATES DR DAUB HAD CALLED IN SOME AMBIEN FOR HER AND SHE THINK HE MAY HAVE PUSHED THE WRONG BUTTON BECAUSE THEY GOT IT FOR 5 PILLS WITH 5 REFILLS, WAS HOPING WE WOULD HAVE IT FIXED BUT IT'S NOT PLEASE CALL  PT AT [phone]    [facility] [place]

## 2013-03-04 NOTE — Telephone Encounter (Signed)
This is how it was written, do you want to change?

## 2013-03-10 ENCOUNTER — Telehealth: Payer: Self-pay | Admitting: Internal Medicine

## 2013-03-10 NOTE — Telephone Encounter (Signed)
Hoy Morn from [facility] called for ekg, echo, stress test and last office note.  These records were faxed to her today 03/10/13. ST.

## 2013-03-18 ENCOUNTER — Encounter: Payer: Self-pay | Admitting: Internal Medicine

## 2013-03-18 ENCOUNTER — Other Ambulatory Visit: Payer: Self-pay | Admitting: Emergency Medicine

## 2013-03-19 ENCOUNTER — Ambulatory Visit (INDEPENDENT_AMBULATORY_CARE_PROVIDER_SITE_OTHER): Payer: Medicare Other | Admitting: Internal Medicine

## 2013-03-19 ENCOUNTER — Encounter: Payer: Self-pay | Admitting: Internal Medicine

## 2013-03-19 VITALS — BP 118/70 | HR 64 | Ht 67.0 in | Wt 149.8 lb

## 2013-03-19 DIAGNOSIS — I872 Venous insufficiency (chronic) (peripheral): Secondary | ICD-10-CM

## 2013-03-19 MED ORDER — DIAZEPAM 5 MG PO TABS: 5.0000 mg | ORAL_TABLET | Freq: Once | ORAL | Status: DC

## 2013-03-19 NOTE — Progress Notes (Signed)
OFFICE NOTE  Chief Complaint:  Symptomatic varicose vein  Primary Care Physician: [name], MD  HPI:  [name] has symptomatic left lower extremity varicose veins. She is now reporting that she is more symptomatic with her left leg varicose veins, which were found to be abnormal on a lower extremity Doppler in October of 2013. There was continuous reflux from the proximal calf to the proximal thigh and the vessel ranged between 0.43 and 0.51 mm. She has been wearing stockings for this without much improvement. Initially, she was set up for endovenous ablation. However, she felt that she was not interested at the time. However, she has now returned and is interested in venous ablation. As mentioned, she has been wearing stockings again for greater than 90 days, as evidenced in my note in September of 2013. She also has CEAP 1, 2, 3 disease. We will discuss with [name] about rescheduling endovenous ablation next month and re-contact her insurance company for prior authorization for this procedure.  PMHx:  Past Medical History  Diagnosis Date  . Chest pain at rest, rule out pericarditis 11/27/2012  . History of pericarditis, with pericardial window in 2009 11/27/2012  . SOB (shortness of breath) 11/27/2012  . Hypothyroidism 11/27/2012  . History of partial nephrectomy 11/27/2012  . Hx of ovarian cancer 11/27/2012  . Venous insufficiency 11/27/2012  . Chronic back pain 11/27/2012  . Hx of melanoma of skin 11/27/2012  . 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 H/O iron deficiency anemia   . Fibromyalgia     Past Surgical History  Procedure Laterality Date  . Joint replacement    . Abdominal hysterectomy    . Total abdominal hysterectomy w/ bilateral salpingoophorectomy    . Appendectomy    . Brain surgery    . Breast surgery    . Spine surgery    . Fracture surgery    . Tubal ligation    . Doppler echocardiography  02/23/2011    LVEF>50%  . Appendectomy      FAMHx:  Family  History  Problem Relation Age of Onset  . Cancer Mother   . Cancer Father   . Bipolar disorder Sister     SOCHx:   reports that she quit smoking about 27 years ago. Her smoking use included Cigarettes. She smoked 0.00 packs per day for 18 years. She does not have any smokeless tobacco history on file. She reports that she does not drink alcohol or use illicit drugs.  ALLERGIES:  Allergies  Allergen Reactions  . Ampicillin Rash    ROS: Pertinent items are noted in HPI.  HOME MEDS: Current Outpatient Prescriptions  Medication Sig Dispense Refill  . acetaminophen (TYLENOL) 325 MG tablet Take 2 tablets (650 mg total) by mouth every 4 (four) hours as needed.      Marland Kitchen albuterol (PROVENTIL HFA;VENTOLIN HFA) 108 (90 BASE) MCG/ACT inhaler Inhale 2 puffs into the lungs every 4 (four) hours as needed for wheezing (cough, shortness of breath or wheezing.).  1 Inhaler  1  . Ascorbic Acid (VITAMIN C PO) Take 1 tablet by mouth daily.      Marland Kitchen aspirin EC 81 MG EC tablet Take 1 tablet (81 mg total) by mouth daily.      Marland Kitchen atorvastatin (LIPITOR) 10 MG tablet Take 1 tablet (10 mg total) by mouth daily at 6 PM.  30 tablet  5  . buPROPion (WELLBUTRIN SR) 150 MG 12 hr tablet Take 1 tablet (150  mg total) by mouth daily.  30 tablet  1  . Cyanocobalamin (VITAMIN B-12 PO) Take 1 tablet by mouth daily.      . cyclobenzaprine (FLEXERIL) 5 MG tablet Take 5 mg by mouth 3 (three) times daily as needed. For spasms      . diazepam (VALIUM) 5 MG tablet Take 1 tablet (5 mg total) by mouth every 8 (eight) hours as needed for anxiety. For anxiety  90 tablet  3  . diclofenac sodium (VOLTAREN) 1 % GEL Apply 2 g topically as needed. For pain      . Diethylpropion HCl CR 75 MG TB24 Take 75 mg by mouth Daily.      . folic acid (FOLVITE) 1 MG tablet Take 1 mg by mouth daily.      Marland Kitchen HYDROcodone-acetaminophen (NORCO) 10-325 MG per tablet Take 1 tablet by mouth every 6 (six) hours as needed. For pain      . levothyroxine  (SYNTHROID, LEVOTHROID) 100 MCG tablet Take 1 tablet (100 mcg total) by mouth daily.  30 tablet  11  . Methotrexate, PF, 25 MG/0.4ML SOAJ Inject 25 mg into the skin once a week.      . metoprolol tartrate (LOPRESSOR) 12.5 mg TABS Take 0.5 tablets (12.5 mg total) by mouth 2 (two) times daily.  30 tablet  11  . morphine (MS CONTIN) 30 MG 12 hr tablet Take 30 mg by mouth 2 (two) times daily.      . Multiple Vitamin (MULTIVITAMIN WITH MINERALS) TABS Take 1 tablet by mouth daily.      . predniSONE (DELTASONE) 5 MG tablet Take 2 tablets by mouth daily.      Marland Kitchen rOPINIRole (REQUIP) 4 MG tablet Take 4 mg by mouth at bedtime.      . triamterene-hydrochlorothiazide (MAXZIDE) 75-50 MG per tablet Take 1 tablet by mouth daily.      Marland Kitchen zolpidem (AMBIEN) 5 MG tablet TAKE 1 TABLET BY MOUTH AT BEDTIME AS NEEDED FOR SLEEP  30 tablet  5  . diazepam (VALIUM) 5 MG tablet Take 1 tablet (5 mg total) by mouth once.  1 tablet  0   No current facility-administered medications for this visit.    LABS/IMAGING: No results found for this or any previous visit (from the past 48 hour(s)). No results found.  VITALS: BP 118/70  Pulse 64  Ht 5\' 7"  (1.702 m)  Wt 149 lb 12.8 oz (67.949 kg)  BMI 23.46 kg/m2  EXAM: deferred  EKG: deferred  ASSESSMENT: 1. Symptomatic varicose veins  PLAN: 1.   [name] has symptomatic varicose veins on the left leg. Specifically an enlarged left greater saphenous vein with a small tributary vein which is also noted to have continuous reflux. She's worn stockings for much greater than 90 days without significant relief. She continues to have swelling in her legs in a dependent position, associated with tenderness, pain, itching and discomfort. She is now desiring venous ablation for permanent solution of her venous insufficiency. We again discussed the risk benefit profile for the procedure and she provided informed consent for the procedure in the office today. I will provide her a  prescription for 5 mg Valium to take 2 hours prior to the procedure. She is aware that she needs a ride to and from the procedure. Followup will be with her approximately 2-3 weeks after the procedure to reassess her outcome.  Chrystie Nose, MD, [facility] Attending Cardiologist [facility]  Renette Hsu C 03/19/2013,  4:56 PM

## 2013-03-19 NOTE — Patient Instructions (Addendum)
Schedule an Endovascular Ablation.  No labs needed. Take 2 tablets of your Valium the day of your procedure before coming to the office . You will need someone with you.    Follow up appointment 2-3 weeks after Endovascular Ablation.

## 2013-03-21 ENCOUNTER — Other Ambulatory Visit (HOSPITAL_COMMUNITY): Payer: Self-pay | Admitting: Internal Medicine

## 2013-03-21 DIAGNOSIS — I872 Venous insufficiency (chronic) (peripheral): Secondary | ICD-10-CM

## 2013-03-26 ENCOUNTER — Other Ambulatory Visit: Payer: Self-pay | Admitting: Physician Assistant

## 2013-03-28 ENCOUNTER — Other Ambulatory Visit: Payer: Self-pay | Admitting: Physician Assistant

## 2013-03-29 DIAGNOSIS — M359 Systemic involvement of connective tissue, unspecified: Secondary | ICD-10-CM | POA: Insufficient documentation

## 2013-03-29 DIAGNOSIS — G4730 Sleep apnea, unspecified: Secondary | ICD-10-CM | POA: Insufficient documentation

## 2013-03-31 ENCOUNTER — Telehealth: Payer: Self-pay

## 2013-03-31 NOTE — Telephone Encounter (Signed)
Call she needs to come in and see me on Thursday or Friday when I'm here

## 2013-03-31 NOTE — Telephone Encounter (Signed)
Do you want her to watch/ wait or come in?

## 2013-03-31 NOTE — Telephone Encounter (Signed)
Pt called and states that she found a nodule on her neck this morning and wants Dr. Cleta Alberts to tell her whether he would like to see her before her appt. With him in July. Please call [phone]

## 2013-03-31 NOTE — Telephone Encounter (Signed)
Called her to advise. She is provided his hours

## 2013-04-04 ENCOUNTER — Ambulatory Visit: Payer: BC Managed Care – PPO

## 2013-04-04 ENCOUNTER — Ambulatory Visit (INDEPENDENT_AMBULATORY_CARE_PROVIDER_SITE_OTHER): Payer: Medicare Other | Admitting: Emergency Medicine

## 2013-04-04 VITALS — BP 122/76 | HR 82 | Temp 97.5°F | Resp 18 | Ht 66.0 in | Wt 154.0 lb

## 2013-04-04 DIAGNOSIS — D518 Other vitamin B12 deficiency anemias: Secondary | ICD-10-CM

## 2013-04-04 DIAGNOSIS — D519 Vitamin B12 deficiency anemia, unspecified: Secondary | ICD-10-CM

## 2013-04-04 DIAGNOSIS — D649 Anemia, unspecified: Secondary | ICD-10-CM

## 2013-04-04 DIAGNOSIS — R9389 Abnormal findings on diagnostic imaging of other specified body structures: Secondary | ICD-10-CM

## 2013-04-04 DIAGNOSIS — R918 Other nonspecific abnormal finding of lung field: Secondary | ICD-10-CM

## 2013-04-04 DIAGNOSIS — R5381 Other malaise: Secondary | ICD-10-CM

## 2013-04-04 LAB — POCT CBC
Granulocyte percent: 48.6 %G (ref 37–80)
HCT, POC: 37 % — AB (ref 37.7–47.9)
Hemoglobin: 11.3 g/dL — AB (ref 12.2–16.2)
Lymph, poc: 2.1 (ref 0.6–3.4)
MCH, POC: 30.8 pg (ref 27–31.2)
MCHC: 30.5 g/dL — AB (ref 31.8–35.4)
MCV: 100.8 fL — AB (ref 80–97)
MID (cbc): 0.3 (ref 0–0.9)
MPV: 10.6 fL (ref 0–99.8)
POC Granulocyte: 2.3 (ref 2–6.9)
POC LYMPH PERCENT: 45 %L (ref 10–50)
POC MID %: 6.4 %M (ref 0–12)
Platelet Count, POC: 164 10*3/uL (ref 142–424)
RBC: 3.67 M/uL — AB (ref 4.04–5.48)
RDW, POC: 14.9 %
WBC: 4.7 10*3/uL (ref 4.6–10.2)

## 2013-04-04 NOTE — Progress Notes (Signed)
  Subjective:    Patient ID: [name], female    DOB: July 21, 1951, 62 y.o.   MRN: [id number]  HPI Pt first noticed "lump" on Left side of neck. Has a history of melanoma, borderline ovarian cancer, and kidney cancer. She noticed this 3 days ago, and she was waiting to see if it would go away, but it has not. The site of her melanoma was on her back. She has a history of connective tissue disease which caused pericarditis. She is under the care of Dr. Azzie Roup as her rheumatologist . She is on methotrexate at the present time. Dr. Rennis Golden is her cardiologist    Review of Systems     Objective:   Physical Exam patient is alert and cooperative. I do not feel any masses in her neck. She does have a artery and her left superior clavicular area which is palpable it is about an inch in length. Chest is clear to auscultation and percussion.  [facility] reading (PRIMARY) by  Dr. Cleta Alberts there is evidence of a left shoulder replacement. The chest x-ray itself is normal.  Results for orders placed in visit on 01/14/13  WOUND CULTURE      Result Value Range   GRAM STAIN No WBC Seen     GRAM STAIN Rare Squamous Epithelial Cells Present     GRAM STAIN No Organisms Seen     Organism ID, Bacteria NORMAL NASOPHARYNGEAL FLORA     Results for orders placed in visit on 04/04/13  POCT CBC      Result Value Range   WBC 4.7  4.6 - 10.2 K/uL   Lymph, poc 2.1  0.6 - 3.4   POC LYMPH PERCENT 45.0  10 - 50 %L   MID (cbc) 0.3  0 - 0.9   POC MID % 6.4  0 - 12 %M   POC Granulocyte 2.3  2 - 6.9   Granulocyte percent 48.6  37 - 80 %G   RBC 3.67 (*) 4.04 - 5.48 M/uL   Hemoglobin 11.3 (*) 12.2 - 16.2 g/dL   HCT, POC 40.9 (*) 81.1 - 47.9 %   MCV 100.8 (*) 80 - 97 fL   MCH, POC 30.8  27 - 31.2 pg   MCHC 30.5 (*) 31.8 - 35.4 g/dL   RDW, POC 91.4     Platelet Count, POC 164  142 - 424 K/uL   MPV 10.6  0 - 99.8 fL       Assessment & Plan:  I do not feel a mass in her left supraclavicular area. Her chest x-ray  looks normal to me. She is somewhat anemic and has been so for a number of years and labs were ordered to further evaluate this.

## 2013-04-05 LAB — IRON AND TIBC
%SAT: 17 % — ABNORMAL LOW (ref 20–55)
Iron: 49 ug/dL (ref 42–145)
TIBC: 288 ug/dL (ref 250–470)
UIBC: 239 ug/dL (ref 125–400)

## 2013-04-05 LAB — VITAMIN B12: Vitamin B-12: 566 pg/mL (ref 211–911)

## 2013-04-05 LAB — FERRITIN: Ferritin: 98 ng/mL (ref 10–291)

## 2013-04-06 ENCOUNTER — Telehealth: Payer: Self-pay

## 2013-04-06 LAB — FOLATE RBC

## 2013-04-06 LAB — RETICULOCYTES

## 2013-04-06 NOTE — Telephone Encounter (Signed)
No. We will check next visit

## 2013-04-06 NOTE — Telephone Encounter (Signed)
Dr. Cleta Alberts, Loney Loh called, no lavender was sent for the Retic count. Would you like for [place] to have pt RTC for blood draw?

## 2013-04-14 ENCOUNTER — Ambulatory Visit (INDEPENDENT_AMBULATORY_CARE_PROVIDER_SITE_OTHER): Payer: Medicare Other | Admitting: Internal Medicine

## 2013-04-14 DIAGNOSIS — I83893 Varicose veins of bilateral lower extremities with other complications: Secondary | ICD-10-CM

## 2013-04-14 DIAGNOSIS — I872 Venous insufficiency (chronic) (peripheral): Secondary | ICD-10-CM

## 2013-04-14 NOTE — Patient Instructions (Signed)
We will ask for you to return for a repeat ultrasound in 2 weeks.  Please reference your post-procedure discharge instructions.

## 2013-04-16 ENCOUNTER — Encounter (HOSPITAL_COMMUNITY): Payer: Medicare Other

## 2013-05-07 ENCOUNTER — Ambulatory Visit: Payer: Medicare Other | Admitting: Internal Medicine

## 2013-05-20 ENCOUNTER — Encounter: Payer: Self-pay | Admitting: Emergency Medicine

## 2013-05-20 ENCOUNTER — Ambulatory Visit (INDEPENDENT_AMBULATORY_CARE_PROVIDER_SITE_OTHER): Payer: Medicare Other | Admitting: Emergency Medicine

## 2013-05-20 VITALS — BP 136/65 | HR 106 | Temp 97.1°F | Resp 16 | Ht 66.0 in | Wt 155.0 lb

## 2013-05-20 DIAGNOSIS — E039 Hypothyroidism, unspecified: Secondary | ICD-10-CM

## 2013-05-20 DIAGNOSIS — G4700 Insomnia, unspecified: Secondary | ICD-10-CM

## 2013-05-20 DIAGNOSIS — F411 Generalized anxiety disorder: Secondary | ICD-10-CM

## 2013-05-20 DIAGNOSIS — R634 Abnormal weight loss: Secondary | ICD-10-CM

## 2013-05-20 DIAGNOSIS — I10 Essential (primary) hypertension: Secondary | ICD-10-CM

## 2013-05-20 DIAGNOSIS — I309 Acute pericarditis, unspecified: Secondary | ICD-10-CM

## 2013-05-20 MED ORDER — DIAZEPAM 5 MG PO TABS: 5.0000 mg | ORAL_TABLET | Freq: Three times a day (TID) | ORAL | Status: DC | PRN

## 2013-05-20 MED ORDER — LEVOTHYROXINE SODIUM 100 MCG PO TABS: 100.0000 ug | ORAL_TABLET | Freq: Every day | ORAL | Status: DC

## 2013-05-20 MED ORDER — ZOLPIDEM TARTRATE 5 MG PO TABS: ORAL_TABLET | ORAL | Status: DC

## 2013-05-20 NOTE — Progress Notes (Signed)
  Subjective:    Patient ID: [name], female    DOB: 08-19-1951, 62 y.o.   MRN: [id number]  HPI patient in for routine followup. She overall is doing well without recent problems with her pericarditis. She had a pericardial window placed and has done well since that time. She continues on methotrexate. She continues on chronic pain management. We'll write prescriptions for her Synthroid Valium and Ambien    Review of Systems     Objective:   Physical Exam she looks great today she is alert and conversive. Her neck is supple her chest was clear her heart was regular rate without murmurs.        Assessment & Plan:  [name] is doing well. She has been able to decrease the amount of pain medication she is taking. She is stable on her other medications. She continues under treatment for her pericarditis with the rheumatologist and cardiologist.

## 2013-06-24 ENCOUNTER — Other Ambulatory Visit: Payer: Self-pay | Admitting: Physician Assistant

## 2013-08-08 ENCOUNTER — Other Ambulatory Visit: Payer: Self-pay

## 2013-08-08 MED ORDER — BUPROPION HCL ER (SR) 150 MG PO TB12: ORAL_TABLET | ORAL | Status: DC

## 2013-08-19 ENCOUNTER — Inpatient Hospital Stay (HOSPITAL_COMMUNITY)
Admission: EM | Admit: 2013-08-19 | Discharge: 2013-08-21 | DRG: 194 | Disposition: A | Discharge status: Home / Self Care | Payer: Medicare Other | Attending: Family Medicine | Admitting: Family Medicine

## 2013-08-19 ENCOUNTER — Emergency Department (HOSPITAL_COMMUNITY): Payer: Medicare Other

## 2013-08-19 ENCOUNTER — Encounter (HOSPITAL_COMMUNITY): Payer: Self-pay | Admitting: Emergency Medicine

## 2013-08-19 ENCOUNTER — Telehealth: Payer: Self-pay | Admitting: Internal Medicine

## 2013-08-19 DIAGNOSIS — F411 Generalized anxiety disorder: Secondary | ICD-10-CM | POA: Diagnosis present

## 2013-08-19 DIAGNOSIS — K5900 Constipation, unspecified: Secondary | ICD-10-CM | POA: Diagnosis present

## 2013-08-19 DIAGNOSIS — I319 Disease of pericardium, unspecified: Secondary | ICD-10-CM | POA: Diagnosis present

## 2013-08-19 DIAGNOSIS — Z8614 Personal history of Methicillin resistant Staphylococcus aureus infection: Secondary | ICD-10-CM

## 2013-08-19 DIAGNOSIS — IMO0001 Reserved for inherently not codable concepts without codable children: Secondary | ICD-10-CM | POA: Diagnosis present

## 2013-08-19 DIAGNOSIS — N390 Urinary tract infection, site not specified: Secondary | ICD-10-CM | POA: Diagnosis present

## 2013-08-19 DIAGNOSIS — E039 Hypothyroidism, unspecified: Secondary | ICD-10-CM | POA: Diagnosis present

## 2013-08-19 DIAGNOSIS — I129 Hypertensive chronic kidney disease with stage 1 through stage 4 chronic kidney disease, or unspecified chronic kidney disease: Secondary | ICD-10-CM | POA: Diagnosis present

## 2013-08-19 DIAGNOSIS — Z905 Acquired absence of kidney: Secondary | ICD-10-CM

## 2013-08-19 DIAGNOSIS — J189 Pneumonia, unspecified organism: Principal | ICD-10-CM | POA: Diagnosis present

## 2013-08-19 DIAGNOSIS — M129 Arthropathy, unspecified: Secondary | ICD-10-CM | POA: Diagnosis present

## 2013-08-19 DIAGNOSIS — Z87891 Personal history of nicotine dependence: Secondary | ICD-10-CM

## 2013-08-19 DIAGNOSIS — Z2989 Encounter for other specified prophylactic measures: Secondary | ICD-10-CM

## 2013-08-19 DIAGNOSIS — F329 Major depressive disorder, single episode, unspecified: Secondary | ICD-10-CM | POA: Diagnosis present

## 2013-08-19 DIAGNOSIS — R079 Chest pain, unspecified: Secondary | ICD-10-CM | POA: Diagnosis present

## 2013-08-19 DIAGNOSIS — G4700 Insomnia, unspecified: Secondary | ICD-10-CM | POA: Diagnosis present

## 2013-08-19 DIAGNOSIS — Z8679 Personal history of other diseases of the circulatory system: Secondary | ICD-10-CM

## 2013-08-19 DIAGNOSIS — I509 Heart failure, unspecified: Secondary | ICD-10-CM | POA: Diagnosis present

## 2013-08-19 DIAGNOSIS — N189 Chronic kidney disease, unspecified: Secondary | ICD-10-CM | POA: Diagnosis present

## 2013-08-19 DIAGNOSIS — Z418 Encounter for other procedures for purposes other than remedying health state: Secondary | ICD-10-CM

## 2013-08-19 DIAGNOSIS — G8929 Other chronic pain: Secondary | ICD-10-CM | POA: Diagnosis present

## 2013-08-19 DIAGNOSIS — Z85820 Personal history of malignant melanoma of skin: Secondary | ICD-10-CM

## 2013-08-19 DIAGNOSIS — Z8543 Personal history of malignant neoplasm of ovary: Secondary | ICD-10-CM

## 2013-08-19 DIAGNOSIS — Z96659 Presence of unspecified artificial knee joint: Secondary | ICD-10-CM

## 2013-08-19 DIAGNOSIS — M549 Dorsalgia, unspecified: Secondary | ICD-10-CM | POA: Diagnosis present

## 2013-08-19 DIAGNOSIS — Z7982 Long term (current) use of aspirin: Secondary | ICD-10-CM

## 2013-08-19 DIAGNOSIS — F3289 Other specified depressive episodes: Secondary | ICD-10-CM | POA: Diagnosis present

## 2013-08-19 HISTORY — DX: Iron deficiency anemia, unspecified: D50.9

## 2013-08-19 HISTORY — DX: Low back pain, unspecified: M54.50

## 2013-08-19 HISTORY — DX: Reserved for concepts with insufficient information to code with codable children: IMO0002

## 2013-08-19 HISTORY — DX: Headache: R51

## 2013-08-19 HISTORY — DX: Pneumonia, unspecified organism: J18.9

## 2013-08-19 HISTORY — DX: Migraine, unspecified, not intractable, without status migrainosus: G43.909

## 2013-08-19 HISTORY — DX: Malignant neoplasm of unspecified kidney, except renal pelvis: C64.9

## 2013-08-19 HISTORY — DX: Low back pain: M54.5

## 2013-08-19 HISTORY — DX: Malignant melanoma of other part of trunk: C43.59

## 2013-08-19 HISTORY — DX: Adverse effect of unspecified anesthetic, initial encounter: T41.45XA

## 2013-08-19 HISTORY — DX: Other complications of anesthesia, initial encounter: T88.59XA

## 2013-08-19 HISTORY — DX: Other chronic pain: G89.29

## 2013-08-19 HISTORY — DX: Depression, unspecified: F32.A

## 2013-08-19 HISTORY — DX: Major depressive disorder, single episode, unspecified: F32.9

## 2013-08-19 LAB — BASIC METABOLIC PANEL
BUN: 34 mg/dL — ABNORMAL HIGH (ref 6–23)
CO2: 26 mEq/L (ref 19–32)
Calcium: 9.3 mg/dL (ref 8.4–10.5)
Chloride: 100 mEq/L (ref 96–112)
Creatinine, Ser: 1.33 mg/dL — ABNORMAL HIGH (ref 0.50–1.10)
GFR calc Af Amer: 48 mL/min — ABNORMAL LOW (ref >90)
GFR calc non Af Amer: 42 mL/min — ABNORMAL LOW (ref >90)
Glucose, Bld: 105 mg/dL — ABNORMAL HIGH (ref 70–99)
Potassium: 3.5 mEq/L (ref 3.5–5.1)
Sodium: 139 mEq/L (ref 135–145)

## 2013-08-19 LAB — CBC
HCT: 37.1 % (ref 36.0–46.0)
HCT: 31.5 % — ABNORMAL LOW (ref 36.0–46.0)
Hemoglobin: 12.7 g/dL (ref 12.0–15.0)
Hemoglobin: 10.9 g/dL — ABNORMAL LOW (ref 12.0–15.0)
MCH: 31.8 pg (ref 26.0–34.0)
MCH: 32.4 pg (ref 26.0–34.0)
MCHC: 34.2 g/dL (ref 30.0–36.0)
MCHC: 34.6 g/dL (ref 30.0–36.0)
MCV: 93 fL (ref 78.0–100.0)
MCV: 93.8 fL (ref 78.0–100.0)
Platelets: 145 10*3/uL — ABNORMAL LOW (ref 150–400)
Platelets: 149 10*3/uL — ABNORMAL LOW (ref 150–400)
RBC: 3.99 MIL/uL (ref 3.87–5.11)
RBC: 3.36 MIL/uL — ABNORMAL LOW (ref 3.87–5.11)
RDW: 14.3 % (ref 11.5–15.5)
RDW: 14.5 % (ref 11.5–15.5)
WBC: 15.1 10*3/uL — ABNORMAL HIGH (ref 4.0–10.5)
WBC: 27.7 10*3/uL — ABNORMAL HIGH (ref 4.0–10.5)

## 2013-08-19 LAB — POCT I-STAT TROPONIN I: Troponin i, poc: 0 ng/mL (ref 0.00–0.08)

## 2013-08-19 LAB — TROPONIN I: Troponin I: <0.3 ng/mL (ref <0.30)

## 2013-08-19 LAB — GLUCOSE, CAPILLARY: Glucose-Capillary: 129 mg/dL — ABNORMAL HIGH (ref 70–99)

## 2013-08-19 LAB — CREATININE, SERUM
Creatinine, Ser: 1.24 mg/dL — ABNORMAL HIGH (ref 0.50–1.10)
GFR calc Af Amer: 52 mL/min — ABNORMAL LOW (ref >90)
GFR calc non Af Amer: 45 mL/min — ABNORMAL LOW (ref >90)

## 2013-08-19 LAB — PRO B NATRIURETIC PEPTIDE: Pro B Natriuretic peptide (BNP): 296.8 pg/mL — ABNORMAL HIGH (ref 0–125)

## 2013-08-19 LAB — MRSA PCR SCREENING: MRSA by PCR: POSITIVE — AB

## 2013-08-19 MED ORDER — SODIUM CHLORIDE 0.9 % IV SOLN: 250.0000 mL | INTRAVENOUS | Status: DC | PRN

## 2013-08-19 MED ORDER — SODIUM CHLORIDE 0.9 % IV BOLUS (SEPSIS)
500.0000 mL | Freq: Once | INTRAVENOUS | Status: AC
  Administered 2013-08-19: 500 mL via INTRAVENOUS

## 2013-08-19 MED ORDER — SENNA 8.6 MG PO TABS
1.0000 | ORAL_TABLET | Freq: Two times a day (BID) | ORAL | Status: DC
  Administered 2013-08-19 – 2013-08-20 (×3): 8.6 mg via ORAL
  Filled 2013-08-19 (×5): qty 1

## 2013-08-19 MED ORDER — DIAZEPAM 5 MG PO TABS
5.0000 mg | ORAL_TABLET | Freq: Three times a day (TID) | ORAL | Status: DC | PRN
  Administered 2013-08-20 – 2013-08-21 (×2): 5 mg via ORAL
  Filled 2013-08-19 (×2): qty 1

## 2013-08-19 MED ORDER — LEVOTHYROXINE SODIUM 100 MCG PO TABS
100.0000 ug | ORAL_TABLET | Freq: Every day | ORAL | Status: DC
  Administered 2013-08-20 – 2013-08-21 (×2): 100 ug via ORAL
  Filled 2013-08-19 (×3): qty 1

## 2013-08-19 MED ORDER — ASPIRIN EC 81 MG PO TBEC
81.0000 mg | DELAYED_RELEASE_TABLET | Freq: Every day | ORAL | Status: DC
  Administered 2013-08-20 – 2013-08-21 (×2): 81 mg via ORAL
  Filled 2013-08-19 (×2): qty 1

## 2013-08-19 MED ORDER — DOCUSATE SODIUM 100 MG PO CAPS
100.0000 mg | ORAL_CAPSULE | Freq: Two times a day (BID) | ORAL | Status: DC
  Administered 2013-08-19 – 2013-08-21 (×4): 100 mg via ORAL
  Filled 2013-08-19 (×6): qty 1

## 2013-08-19 MED ORDER — AZITHROMYCIN 500 MG PO TABS
500.0000 mg | ORAL_TABLET | Freq: Every day | ORAL | Status: DC
  Filled 2013-08-19: qty 1

## 2013-08-19 MED ORDER — SODIUM CHLORIDE 0.9 % IJ SOLN
3.0000 mL | Freq: Two times a day (BID) | INTRAMUSCULAR | Status: DC
  Administered 2013-08-20: 3 mL via INTRAVENOUS

## 2013-08-19 MED ORDER — CEFTRIAXONE SODIUM 1 G IJ SOLR
1.0000 g | INTRAMUSCULAR | Status: DC
Start: 2013-08-20 — End: 2013-08-20

## 2013-08-19 MED ORDER — ACETAMINOPHEN 325 MG PO TABS: 650.0000 mg | ORAL_TABLET | ORAL | Status: DC | PRN

## 2013-08-19 MED ORDER — ALBUTEROL SULFATE HFA 108 (90 BASE) MCG/ACT IN AERS
2.0000 | INHALATION_SPRAY | RESPIRATORY_TRACT | Status: DC | PRN
  Filled 2013-08-19: qty 6.7

## 2013-08-19 MED ORDER — METOPROLOL TARTRATE 12.5 MG HALF TABLET
12.5000 mg | ORAL_TABLET | Freq: Two times a day (BID) | ORAL | Status: DC
  Administered 2013-08-20: 12.5 mg via ORAL
  Filled 2013-08-19 (×5): qty 1

## 2013-08-19 MED ORDER — DEXTROSE 5 % IV SOLN
1.0000 g | Freq: Once | INTRAVENOUS | Status: AC
  Administered 2013-08-19: 1 g via INTRAVENOUS
  Filled 2013-08-19: qty 10

## 2013-08-19 MED ORDER — ROPINIROLE HCL 1 MG PO TABS
4.0000 mg | ORAL_TABLET | Freq: Every day | ORAL | Status: DC
  Administered 2013-08-19 – 2013-08-20 (×2): 4 mg via ORAL
  Filled 2013-08-19 (×3): qty 4

## 2013-08-19 MED ORDER — BUPROPION HCL ER (SR) 150 MG PO TB12
150.0000 mg | ORAL_TABLET | Freq: Every day | ORAL | Status: DC
  Administered 2013-08-20 – 2013-08-21 (×2): 150 mg via ORAL
  Filled 2013-08-19 (×2): qty 1

## 2013-08-19 MED ORDER — HEPARIN SODIUM (PORCINE) 5000 UNIT/ML IJ SOLN
5000.0000 [IU] | Freq: Three times a day (TID) | INTRAMUSCULAR | Status: DC
Start: 2013-08-19 — End: 2013-08-21
  Administered 2013-08-19 – 2013-08-21 (×4): 5000 [IU] via SUBCUTANEOUS
  Filled 2013-08-19 (×8): qty 1

## 2013-08-19 MED ORDER — ATORVASTATIN CALCIUM 10 MG PO TABS
10.0000 mg | ORAL_TABLET | Freq: Every day | ORAL | Status: DC
  Administered 2013-08-20: 10 mg via ORAL
  Filled 2013-08-19 (×2): qty 1

## 2013-08-19 MED ORDER — DICLOFENAC SODIUM 1 % TD GEL
2.0000 g | Freq: Every day | TRANSDERMAL | Status: DC | PRN
  Filled 2013-08-19: qty 100

## 2013-08-19 MED ORDER — SODIUM CHLORIDE 0.9 % IJ SOLN: 3.0000 mL | INTRAMUSCULAR | Status: DC | PRN

## 2013-08-19 MED ORDER — ZOLPIDEM TARTRATE 5 MG PO TABS
5.0000 mg | ORAL_TABLET | Freq: Every evening | ORAL | Status: DC | PRN
  Administered 2013-08-19 – 2013-08-21 (×2): 5 mg via ORAL
  Filled 2013-08-19 (×2): qty 1

## 2013-08-19 MED ORDER — SODIUM CHLORIDE 0.9 % IJ SOLN
3.0000 mL | Freq: Two times a day (BID) | INTRAMUSCULAR | Status: DC
  Administered 2013-08-19: 3 mL via INTRAVENOUS

## 2013-08-19 MED ORDER — AZITHROMYCIN 250 MG PO TABS
500.0000 mg | ORAL_TABLET | Freq: Once | ORAL | Status: AC
  Administered 2013-08-19: 500 mg via ORAL
  Filled 2013-08-19: qty 2

## 2013-08-19 MED ORDER — LIDOCAINE-PRILOCAINE 2.5-2.5 % EX CREA: TOPICAL_CREAM | Freq: Every day | CUTANEOUS | Status: DC | PRN

## 2013-08-19 MED ORDER — MORPHINE SULFATE ER 30 MG PO TBCR
30.0000 mg | EXTENDED_RELEASE_TABLET | Freq: Two times a day (BID) | ORAL | Status: DC
  Administered 2013-08-19 – 2013-08-21 (×4): 30 mg via ORAL
  Filled 2013-08-19 (×4): qty 1

## 2013-08-19 MED ORDER — HYDROCODONE-ACETAMINOPHEN 10-325 MG PO TABS
1.0000 | ORAL_TABLET | Freq: Four times a day (QID) | ORAL | Status: DC | PRN
  Administered 2013-08-19 – 2013-08-21 (×5): 1 via ORAL
  Filled 2013-08-19 (×5): qty 1

## 2013-08-19 MED ORDER — TRIAMTERENE-HCTZ 75-50 MG PO TABS
1.0000 | ORAL_TABLET | Freq: Every day | ORAL | Status: DC
  Administered 2013-08-20 – 2013-08-21 (×2): 1 via ORAL
  Filled 2013-08-19 (×2): qty 1

## 2013-08-19 NOTE — Telephone Encounter (Signed)
Pt is at [employer].

## 2013-08-19 NOTE — Progress Notes (Signed)
Received report from ED.  

## 2013-08-19 NOTE — Telephone Encounter (Signed)
[name] was having chest pain and back pain and hard to take deep breaths.  Wanted a nurse to all her.  Phone got disconnected when her daughter called her. I called her back and she said she was going to call an ambulance and go to ER . FYI

## 2013-08-19 NOTE — ED Provider Notes (Signed)
CSN: [id number]     Arrival date & time 08/19/13  1442 History   First MD Initiated Contact with Patient 08/19/13 1510     Chief Complaint  Patient presents with  . Chest Pain   (Consider location/radiation/quality/duration/timing/severity/associated sxs/prior Treatment) Patient is a 62 y.o. female presenting with chest pain. The history is provided by the patient.  Chest Pain Pain location:  Substernal area Associated symptoms: shortness of breath   Associated symptoms: no abdominal pain, no back pain, no headache, no nausea, no numbness, not vomiting and no weakness    patient fell pain in her upper chest it goes for back earlier today. She states she's had chills. No real cough. She's had some mild difficulty breathing. She states that she has a history of pericarditis. She's had previous pericardial window. She is on chronic methotrexate. She states she's been having chills but no fever. No vomiting. She states she's had some dysuria.  Past Medical History  Diagnosis Date  . Chest pain at rest, rule out pericarditis 11/27/2012  . History of pericarditis, with pericardial window in 2009 11/27/2012  . SOB (shortness of breath) 11/27/2012  . Hypothyroidism 11/27/2012  . History of partial nephrectomy 11/27/2012  . Hx of ovarian cancer 11/27/2012  . Venous insufficiency 11/27/2012  . Chronic back pain 11/27/2012  . Hx of melanoma of skin 11/27/2012  . 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 H/O iron deficiency anemia   . Fibromyalgia   . Arthritis   . CHF (congestive heart failure)   . Chronic kidney disease   . Anemia   . Anxiety   . Heart murmur    Past Surgical History  Procedure Laterality Date  . Joint replacement    . Abdominal hysterectomy    . Total abdominal hysterectomy w/ bilateral salpingoophorectomy    . Appendectomy    . Breast surgery    . Spine surgery    . Fracture surgery    . Tubal ligation    . Doppler echocardiography  02/23/2011    LVEF>50%  . Appendectomy      Family History  Problem Relation Age of Onset  . Cancer Mother   . Cancer Father   . Bipolar disorder Sister    History  Substance Use Topics  . Smoking status: Former Smoker -- 18 years    Types: Cigarettes    Quit date: 09/22/1985  . Smokeless tobacco: Not on file  . Alcohol Use: No   OB History   Grav Para Term Preterm Abortions TAB SAB Ect Mult Living                 Review of Systems  Constitutional: Negative for activity change and appetite change.  Eyes: Negative for pain.  Respiratory: Positive for shortness of breath. Negative for chest tightness.   Cardiovascular: Positive for chest pain. Negative for leg swelling.  Gastrointestinal: Negative for nausea, vomiting, abdominal pain and diarrhea.  Genitourinary: Positive for dysuria. Negative for flank pain.  Musculoskeletal: Negative for back pain and neck stiffness.  Skin: Negative for rash.  Neurological: Negative for weakness, numbness and headaches.  Psychiatric/Behavioral: Negative for behavioral problems.    Allergies  Ampicillin  Home Medications   Current Outpatient Rx  Name  Route  Sig  Dispense  Refill  . acetaminophen (TYLENOL) 325 MG tablet   Oral   Take 2 tablets (650 mg total) by mouth every 4 (four) hours as needed.         Marland Kitchen  albuterol (PROVENTIL HFA;VENTOLIN HFA) 108 (90 BASE) MCG/ACT inhaler   Inhalation   Inhale 2 puffs into the lungs every 4 (four) hours as needed for wheezing (cough, shortness of breath or wheezing.).   1 Inhaler   1   . Ascorbic Acid (VITAMIN C PO)   Oral   Take 1 tablet by mouth daily.         Marland Kitchen aspirin EC 81 MG EC tablet   Oral   Take 1 tablet (81 mg total) by mouth daily.         Marland Kitchen atorvastatin (LIPITOR) 10 MG tablet   Oral   Take 1 tablet (10 mg total) by mouth daily at 6 PM.   30 tablet   5   . buPROPion (WELLBUTRIN SR) 150 MG 12 hr tablet      TAKE 1 TABLET BY MOUTH ONCE DAILY.   30 tablet   2   . Cyanocobalamin (VITAMIN B-12 PO)    Oral   Take 1 tablet by mouth daily.         . cyclobenzaprine (FLEXERIL) 5 MG tablet   Oral   Take 5 mg by mouth 3 (three) times daily as needed. For spasms         . diazepam (VALIUM) 5 MG tablet   Oral   Take 1 tablet (5 mg total) by mouth once.   1 tablet   0     TAKE 2 TABLET MORNING OF Endovascular ABLATION   . diazepam (VALIUM) 5 MG tablet   Oral   Take 1 tablet (5 mg total) by mouth every 8 (eight) hours as needed for anxiety. For anxiety   90 tablet   3   . diclofenac sodium (VOLTAREN) 1 % GEL   Topical   Apply 2 g topically as needed. For pain         . Diethylpropion HCl CR 75 MG TB24   Oral   Take 75 mg by mouth Daily.         . folic acid (FOLVITE) 1 MG tablet   Oral   Take 1 mg by mouth daily.         Marland Kitchen HYDROcodone-acetaminophen (NORCO) 10-325 MG per tablet   Oral   Take 1 tablet by mouth every 6 (six) hours as needed. For pain         . levothyroxine (SYNTHROID, LEVOTHROID) 100 MCG tablet   Oral   Take 1 tablet (100 mcg total) by mouth daily.   30 tablet   11   . Methotrexate, PF, 25 MG/0.4ML SOAJ   Subcutaneous   Inject 25 mg into the skin once a week.         . metoprolol tartrate (LOPRESSOR) 12.5 mg TABS   Oral   Take 0.5 tablets (12.5 mg total) by mouth 2 (two) times daily.   30 tablet   11   . morphine (MS CONTIN) 30 MG 12 hr tablet   Oral   Take 30 mg by mouth 2 (two) times daily.         . Multiple Vitamin (MULTIVITAMIN WITH MINERALS) TABS   Oral   Take 1 tablet by mouth daily.         . predniSONE (DELTASONE) 5 MG tablet   Oral   Take 2 tablets by mouth daily.         Marland Kitchen rOPINIRole (REQUIP) 4 MG tablet   Oral   Take 4 mg by mouth at bedtime.         Marland Kitchen  triamterene-hydrochlorothiazide (MAXZIDE) 75-50 MG per tablet   Oral   Take 1 tablet by mouth daily.         Marland Kitchen zolpidem (AMBIEN) 5 MG tablet      TAKE 1 TABLET BY MOUTH AT BEDTIME AS NEEDED FOR SLEEP   30 tablet   5    BP 117/48  Pulse 111   Temp(Src) 100.1 F (37.8 C) (Oral)  Resp 15  Ht 5\' 6"  (1.676 m)  Wt 151 lb 9.6 oz (68.765 kg)  BMI 24.48 kg/m2  SpO2 97% Physical Exam  Nursing note and vitals reviewed. Constitutional: She is oriented to person, place, and time. She appears well-developed and well-nourished.  Patient feels warm.  HENT:  Head: Normocephalic and atraumatic.  Eyes: EOM are normal. Pupils are equal, round, and reactive to light.  Neck: Normal range of motion. Neck supple.  Cardiovascular: Regular rhythm and normal heart sounds.  Exam reveals no friction rub.   No murmur heard. Tachycardia  Pulmonary/Chest: Effort normal and breath sounds normal. No respiratory distress. She has no wheezes. She has no rales.  Abdominal: Soft. Bowel sounds are normal. She exhibits no distension. There is no tenderness. There is no rebound and no guarding.  Musculoskeletal: Normal range of motion.  Neurological: She is alert and oriented to person, place, and time. No cranial nerve deficit.  Skin: Skin is warm and dry.  Psychiatric: She has a normal mood and affect. Her speech is normal.    ED Course  Procedures (including critical care time) Labs Review Labs Reviewed  CBC - Abnormal; Notable for the following:    WBC 15.1 (*)    Platelets 145 (*)    All other components within normal limits  BASIC METABOLIC PANEL - Abnormal; Notable for the following:    Glucose, Bld 105 (*)    BUN 34 (*)    Creatinine, Ser 1.33 (*)    GFR calc non Af Amer 42 (*)    GFR calc Af Amer 48 (*)    All other components within normal limits  PRO B NATRIURETIC PEPTIDE - Abnormal; Notable for the following:    Pro B Natriuretic peptide (BNP) 296.8 (*)    All other components within normal limits  POCT I-STAT TROPONIN I   Imaging Review Dg Chest 2 View  08/19/2013   CLINICAL DATA:  Shortness of breath, fever  EXAM: CHEST  2 VIEW  COMPARISON:  04/04/2013  FINDINGS: Cardiomediastinal silhouette is stable. There is patchy infiltrate/  pneumonia in right upper lobe. Follow-up to resolution after appropriate treatment is recommended. Bony thorax is stable. Left shoulder prosthesis again noted.  IMPRESSION: Patchy infiltrate/ pneumonia in right upper lobe. Follow-up to resolution after appropriate treatment is recommended.   Electronically Signed   By: Natasha Mead M.D.   On: 08/19/2013 15:43    EKG Interpretation     Ventricular Rate:  131 PR Interval:  166 QRS Duration: 84 QT Interval:  256 QTC Calculation: 378 R Axis:   50 Text Interpretation:  Sinus tachycardia Right atrial enlargement Anterior infarct , age undetermined ST \\T \ T wave abnormality, consider inferolateral ischemia Abnormal ECG            MDM   1. CAP (community acquired pneumonia)    Patient presents with upper chest pain and fever. X-ray shows pneumonia. White count is elevated. She also has a previous history of pericarditis and there could be an involvement of this. Will be admitted to [facility] with a  cardiology consult.    Juliet Rude. Rubin Payor, MD 08/19/13 1712

## 2013-08-19 NOTE — Telephone Encounter (Signed)
Returned call.  Pt stated her daughter is a few minutes away from getting her to take her to ER.  Pt able to talk, but breathing audibly labored.  Pt c/o CP that radiates to back.  Stated when she had this before Vernona Rieger (NP) put her in the hospital.  Pt stated her daughter will be there shortly and she can breathe fine right now.  Pt thanked Charity fundraiser for calling and ended call.    Will monitor chart to check for pt's arrival in ER.    Message forwarded to Dr. Rennis Golden [facility]).

## 2013-08-19 NOTE — Telephone Encounter (Signed)
She is having pain in back and chest  Cant take a deep breath

## 2013-08-19 NOTE — H&P (Signed)
Family Medicine Teaching [facility] Admission History and Physical Service Pager: [phone]  Patient name: [name] Medical record number: [id number] Date of birth: [birth year] Age: 62 y.o. Gender: female  Primary Care Provider: [name], MD Consultants: None  Code Status: Full  Chief Complaint: Chest pain, SOB  Assessment and Plan: [name] is a 62 y.o. female presenting with chest pain, shortness of breath and fever. PMH is significant for pericarditis.   # Pleuritic chest pain. Likely explained by pulmonary infiltrate, i.e. pneumonia, and so will treat with antibiotics for community-acquired pneumonia. With history of pericarditis, there is concern for recurrence but pro-BNP not greatly elevated and there have been no changes in medical management. There is also no positional component to pain and cardiology consultant attributes symptoms to PNA. Will also rule out ischemia.  She is tachycardic raising concern for PE, but no signs/symptoms of DVT and the PNA is a more likely cause. - Ceftriaxone, azithromycin - Repeat CBC in AM - Continue albuterol prn ACS rule out - POC troponin neg. Will cycle troponins x3 - ECG with sinus tachycardia and some baseline wander, no signs of ischemia. Will repeat in AM - Risk stratification: Last lipid panel < 1 yr ago wnl; checking Hb A1c (previous was 6% in February) and TSH as she has hypothyroidism and previous result was abnormal.  - Continue statin  # Chronic pain.   - Continue home pain medications and monitor for over-sedation.  - Senna and ducosate for anticipated constipation  # Depression/anxiety/insomnia - Continue wellbutrin, valium, and ambien  # Dysuria and urinary frequency - Will obtain U/A with microscopy, though utility is limited after dosing of antibiotics.   # Elevated creatinine. Seems to be near baseline, so suspect some CKD.  - No IVF as can tolerate po.  - Repeat BMP in AM  # Hypothyroidism:  -  continue home dose of synthroid , check TSH  # History of MRSA infection - Contact isolation  FEN/GI: SLIV, carb-modified diet (pt is "pre-diabetic") Prophylaxis: Subcutaneous heparin  Disposition: Admit to FMTS, Dr. Jennette Kettle admitting, on telemetry for continued monitoring and IV antibiotic therapy.   History of Present Illness: [name] is a 62 y.o. female presenting with fever, chest pain, and dyspnea. She has a history of recurrent pericarditis s/p pericardial window in 2009, ovarian CA s/p TAH-BSO, partial nephrectomy, hypothyroidism, HTN, anxiety, insomnia, fibromyalgia and back pain.   She complains of substernal chest pain that started around noon today associated with fever and chills and some dyspnea. It radiates to her back, not related to exertion or relieved by rest, worse with stabbing sensation on deep inspiration. This is somewhat relieved by an albuterol inhaler. The pain has no positional component. She denies any cough, nausea, vomiting, diarrhea, leg swelling, orthopnea. She has had urinary frequency and dysuria, denies gross hematuria. She's never had a UTI. She has a remote history of smoking. Denies shortness of breath at rest or with exertion.  She endorses a moderate headache which she attributes to not having food for several hours and not receiving any of her home pain medications since early this morning. She is followed by pain management, Dr. Genella Mech in [facility], [place] for chronic pain related to fibromyalgia, and chronic back pain with a history of lumbar spondylosis and stenosis of L5-S1 s/p decompression and total diskectomy.   She called her cardiologist's office today complaining of chest pain and reported that she was going to the ED. In the  ED she was found to have an infiltrate in the right upper lobe consistent with pneumonia.    Review Of Systems: Per HPI Otherwise 12 point review of systems was performed and was unremarkable.  Patient Active  Problem List   Diagnosis Date Noted  . Anxiety 11/28/2012  . Seizure disorder 11/28/2012  . Chest pain at rest, rule out pericarditis 11/27/2012  . History of pericarditis, with pericardial window in 2009, and recurrent episodes 2011,2013, treated with methotrexate 11/27/2012  . SOB (shortness of breath) 11/27/2012  . Hypothyroidism 11/27/2012  . History of partial nephrectomy, both rt. and lt 11/27/2012  . Hx of ovarian cancer 11/27/2012  . Venous insufficiency 11/27/2012  . Chronic back pain, hx of lumbar spondylosis and stenosis L5-S1 with hx decompression and total diskectomy 11/27/2012  . Hx of melanoma of skin 11/27/2012  . 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 DIARRHEA-PRESUMED INFECTIOUS 12/27/2009   Past Medical History: Past Medical History  Diagnosis Date  . Chest pain at rest, rule out pericarditis 11/27/2012  . History of pericarditis, with pericardial window in 2009 11/27/2012  . SOB (shortness of breath) 11/27/2012  . Hypothyroidism 11/27/2012  . History of partial nephrectomy 11/27/2012  . Hx of ovarian cancer 11/27/2012  . Venous insufficiency 11/27/2012  . Chronic back pain 11/27/2012  . Hx of melanoma of skin 11/27/2012  . 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 H/O iron deficiency anemia   . Fibromyalgia   . Arthritis   . CHF (congestive heart failure)   . Chronic kidney disease   . Anemia   . Anxiety   . Heart murmur    Past Surgical History: Past Surgical History  Procedure Laterality Date  . Joint replacement    . Abdominal hysterectomy    . Total abdominal hysterectomy w/ bilateral salpingoophorectomy    . Appendectomy    . Breast surgery    . Spine surgery    . Fracture surgery    . Tubal ligation    . Doppler echocardiography  02/23/2011    LVEF>50%  . Appendectomy     Social History: History  Substance Use Topics  . Smoking status: Former Smoker -- 18 years    Types: Cigarettes    Quit date: 09/22/1985  . Smokeless tobacco: Not on file   . Alcohol Use: No   Please also refer to relevant sections of EMR.  Family History: Family History  Problem Relation Age of Onset  . Cancer Mother   . Cancer Father   . Bipolar disorder Sister    Allergies and Medications: Allergies  Allergen Reactions  . Ampicillin Rash   No current facility-administered medications on file prior to encounter.   Current Outpatient Prescriptions on File Prior to Encounter  Medication Sig Dispense Refill  . acetaminophen (TYLENOL) 325 MG tablet Take 2 tablets (650 mg total) by mouth every 4 (four) hours as needed.      Marland Kitchen albuterol (PROVENTIL HFA;VENTOLIN HFA) 108 (90 BASE) MCG/ACT inhaler Inhale 2 puffs into the lungs every 4 (four) hours as needed for wheezing (cough, shortness of breath or wheezing.).  1 Inhaler  1  . Ascorbic Acid (VITAMIN C PO) Take 1 tablet by mouth daily.      Marland Kitchen aspirin EC 81 MG EC tablet Take 1 tablet (81 mg total) by mouth daily.      Marland Kitchen atorvastatin (LIPITOR) 10 MG tablet Take 1 tablet (10 mg total) by mouth daily at 6 PM.  30  tablet  5  . buPROPion (WELLBUTRIN SR) 150 MG 12 hr tablet TAKE 1 TABLET BY MOUTH ONCE DAILY.  30 tablet  2  . Cyanocobalamin (VITAMIN B-12 PO) Take 1 tablet by mouth daily.      . cyclobenzaprine (FLEXERIL) 5 MG tablet Take 5 mg by mouth 3 (three) times daily as needed. For spasms      . diazepam (VALIUM) 5 MG tablet Take 1 tablet (5 mg total) by mouth once.  1 tablet  0  . diazepam (VALIUM) 5 MG tablet Take 1 tablet (5 mg total) by mouth every 8 (eight) hours as needed for anxiety. For anxiety  90 tablet  3  . diclofenac sodium (VOLTAREN) 1 % GEL Apply 2 g topically as needed. For pain      . Diethylpropion HCl CR 75 MG TB24 Take 75 mg by mouth Daily.      . folic acid (FOLVITE) 1 MG tablet Take 1 mg by mouth daily.      Marland Kitchen HYDROcodone-acetaminophen (NORCO) 10-325 MG per tablet Take 1 tablet by mouth every 6 (six) hours as needed. For pain      . levothyroxine (SYNTHROID, LEVOTHROID) 100 MCG tablet  Take 1 tablet (100 mcg total) by mouth daily.  30 tablet  11  . Methotrexate, PF, 25 MG/0.4ML SOAJ Inject 25 mg into the skin once a week.      . metoprolol tartrate (LOPRESSOR) 12.5 mg TABS Take 0.5 tablets (12.5 mg total) by mouth 2 (two) times daily.  30 tablet  11  . morphine (MS CONTIN) 30 MG 12 hr tablet Take 30 mg by mouth 2 (two) times daily.      . Multiple Vitamin (MULTIVITAMIN WITH MINERALS) TABS Take 1 tablet by mouth daily.      . predniSONE (DELTASONE) 5 MG tablet Take 2 tablets by mouth daily.      Marland Kitchen rOPINIRole (REQUIP) 4 MG tablet Take 4 mg by mouth at bedtime.      . triamterene-hydrochlorothiazide (MAXZIDE) 75-50 MG per tablet Take 1 tablet by mouth daily.      Marland Kitchen zolpidem (AMBIEN) 5 MG tablet TAKE 1 TABLET BY MOUTH AT BEDTIME AS NEEDED FOR SLEEP  30 tablet  5    Objective: BP 138/65  Pulse 128  Temp(Src) 100.1 F (37.8 C) (Oral)  Resp 24  Ht 5\' 6"  (1.676 m)  Wt 151 lb 9.6 oz (68.765 kg)  BMI 24.48 kg/m2  SpO2 97% Exam: General: Thin 62 yo female in some pain.  HEENT: MMM, oropharynx clear, no drainage Cardiovascular: tachycardic without mumurs Respiratory: Non-labored and normal rate on RA, diminished breath sounds at RUL.  Coarse breath sounds on the right. Abdomen: Normoactive BS, soft, NT, ND Extremities: Warm, well perfused. No edema, no calf tenderness, 2+ DP pulses  Skin: Healing burn on right shoulder without signs of infection, no other wounds or lesions noted.  Neuro: AOx3, CN II-XII intact, speech normal, gait normal  Labs and Imaging: CBC BMET   Recent Labs Lab 08/19/13 1500  WBC 15.1*  HGB 12.7  HCT 37.1  PLT 145*    Recent Labs Lab 08/19/13 1500  NA 139  K 3.5  CL 100  CO2 26  BUN 34*  CREATININE 1.33*  GLUCOSE 105*  CALCIUM 9.3    Pro-BNP: 296  CHEST 2 VIEW   FINDINGS: Cardiomediastinal silhouette is stable. There is patchy infiltrate/ pneumonia in right upper lobe. Follow-up to resolution after appropriate treatment is  recommended. Bony thorax is  stable. Left shoulder prosthesis again noted. IMPRESSION: Patchy infiltrate/ pneumonia in right upper lobe. Follow-up to resolution after appropriate treatment is recommended.   EKG: nsr; tachycardic; normal axis; normal intervals; T-wave inversions in infero-lateral leads (present on prior ekgs)  Tina Junker, MD 08/19/2013, 4:03 PM PGY-1, [facility] Family Medicine FPTS Intern pager: [phone], text pages welcome  PGY-3  Addendum I have seen and examined this patient.  I agree with the above note with my additions in pink.  [name] 08/19/2013, 10:30 PM

## 2013-08-19 NOTE — ED Notes (Addendum)
Pt reports mid chest sharp and dull pains that radiate through to her back and sob since this am. Recent hx of pericarditis. Denies swelling to extremities, reports hx of chf. Airway intact, no resp distress noted at triage, skin w/d, ekg done at triage.

## 2013-08-19 NOTE — Progress Notes (Signed)
[name] [id number] Code Status: full   Admission Data: 08/19/2013 6:57 PM Attending Provider:  neal WUJ:WJXB, Stan Head, MD Consults/ Treatment Team: Treatment Team:  Chrystie Nose, MD  [name] is a 62 y.o. female patient admitted from ED awake, alert - oriented  X 3 - no acute distress noted.  VSS - Blood pressure 115/62, pulse 104, temperature 98.3 F (36.8 C), temperature source Oral, resp. rate 18, height 5\' 6"  (1.676 m), weight 69.174 kg (152 lb 8 oz), SpO2 90.00%.  no c/o shortness of breath, no c/o chest pain. Cardiac tele # [id number], in place, cardiac monitor yields:normal sinus rhythm.  IV Fluids:  IV in place, occlusive dsg intact without redness, IV cath antecubital left, condition patent and no redness normal saline.  Allergies:   Allergies  Allergen Reactions  . Ampicillin Rash     Past Medical History  Diagnosis Date  . Chest pain at rest, rule out pericarditis 11/27/2012  . History of pericarditis, with pericardial window in 2009 11/27/2012  . SOB (shortness of breath) 11/27/2012  . Hypothyroidism 11/27/2012  . History of partial nephrectomy 11/27/2012  . Hx of ovarian cancer 11/27/2012  . Venous insufficiency 11/27/2012  . Chronic back pain 11/27/2012  . Hx of melanoma of skin 11/27/2012  . 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 H/O iron deficiency anemia   . Fibromyalgia   . Arthritis   . CHF (congestive heart failure)   . Chronic kidney disease   . Anemia   . Anxiety   . Heart murmur    Medications Prior to Admission  Medication Sig Dispense Refill  . acetaminophen (TYLENOL) 325 MG tablet Take 2 tablets (650 mg total) by mouth every 4 (four) hours as needed.      Marland Kitchen albuterol (PROVENTIL HFA;VENTOLIN HFA) 108 (90 BASE) MCG/ACT inhaler Inhale 2 puffs into the lungs every 4 (four) hours as needed for wheezing (cough, shortness of breath or wheezing.).  1 Inhaler  1  . Ascorbic Acid (VITAMIN C PO) Take 1 tablet by mouth daily.      Marland Kitchen aspirin EC 81 MG EC tablet  Take 1 tablet (81 mg total) by mouth daily.      Marland Kitchen atorvastatin (LIPITOR) 10 MG tablet Take 1 tablet (10 mg total) by mouth daily at 6 PM.  30 tablet  5  . buPROPion (WELLBUTRIN SR) 150 MG 12 hr tablet TAKE 1 TABLET BY MOUTH ONCE DAILY.  30 tablet  2  . Cyanocobalamin (VITAMIN B-12 PO) Take 1 tablet by mouth daily.      . cyclobenzaprine (FLEXERIL) 5 MG tablet Take 5 mg by mouth 3 (three) times daily as needed. For spasms      . diazepam (VALIUM) 5 MG tablet Take 1 tablet (5 mg total) by mouth once.  1 tablet  0  . diazepam (VALIUM) 5 MG tablet Take 1 tablet (5 mg total) by mouth every 8 (eight) hours as needed for anxiety. For anxiety  90 tablet  3  . diclofenac sodium (VOLTAREN) 1 % GEL Apply 2 g topically as needed. For pain      . Diethylpropion HCl CR 75 MG TB24 Take 75 mg by mouth Daily.      . folic acid (FOLVITE) 1 MG tablet Take 1 mg by mouth daily.      Marland Kitchen HYDROcodone-acetaminophen (NORCO) 10-325 MG per tablet Take 1 tablet by mouth every 6 (six) hours as needed. For pain      .  levothyroxine (SYNTHROID, LEVOTHROID) 100 MCG tablet Take 1 tablet (100 mcg total) by mouth daily.  30 tablet  11  . Methotrexate, PF, 25 MG/0.4ML SOAJ Inject 25 mg into the skin once a week.      . metoprolol tartrate (LOPRESSOR) 12.5 mg TABS Take 0.5 tablets (12.5 mg total) by mouth 2 (two) times daily.  30 tablet  11  . morphine (MS CONTIN) 30 MG 12 hr tablet Take 30 mg by mouth 2 (two) times daily.      . Multiple Vitamin (MULTIVITAMIN WITH MINERALS) TABS Take 1 tablet by mouth daily.      . predniSONE (DELTASONE) 5 MG tablet Take 2 tablets by mouth daily.      Marland Kitchen rOPINIRole (REQUIP) 4 MG tablet Take 4 mg by mouth at bedtime.      . triamterene-hydrochlorothiazide (MAXZIDE) 75-50 MG per tablet Take 1 tablet by mouth daily.      Marland Kitchen zolpidem (AMBIEN) 5 MG tablet TAKE 1 TABLET BY MOUTH AT BEDTIME AS NEEDED FOR SLEEP  30 tablet  5   History:  obtained from the patient. Tobacco/alcohol: denied social  drinker  Orientation to room, and floor completed with information packet given to patient/family.  Patient declined safety video at this time.  Admission INP armband ID verified with patient/family, and in place.   SR up x 2, fall assessment complete, with patient and family able to verbalize understanding of risk associated with falls, and verbalized understanding to call nsg before up out of bed.  Call light within reach, patient able to voice, and demonstrate understanding.  Skin, clean-dry- intact without evidence of bruising, or skin tears.   No evidence of skin break down noted on exam.     Will cont to eval and treat per MD orders.  Orvan Seen, RN 08/19/2013 6:57 PM

## 2013-08-19 NOTE — Telephone Encounter (Signed)
Thanks

## 2013-08-19 NOTE — Consult Note (Signed)
CONSULTATION NOTE  Reason for Consult: Chest pain  Requesting Physician: Dr. Rubin Payor  Cardiologist: Rennis Golden  HPI: This is a 62 y.o. 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 In addition, she has a history of varicose veins and mild venous reflux which has not been attended to. She returns today for regular followup. It has been almost 16 months. She has had about a 40-pound weight loss since her last visit, BMI now 26.5, blood pressure well controlled at 100/64. Again, she has very infrequent chest pain but has recently been having worsening renal function and the etiology is yet unclear.  I had seen her a few months ago for problems with varicose veins and she underwent successful venous ablation.   Over the past few days, she has noted increasing chest pressure, no cough, mild chills and today fevers.  She had reported dropping her curling iron on her right shoulder and burning it, but has been treating it to try to keep it from getting infected.  She called the office due to chest pain and was indicating she was presenting to the ER.  In the ER she was noted to have a right upper lobe pneumonia on CXR.    PMHx:  Past Medical History  Diagnosis Date  . Chest pain at rest, rule out pericarditis 11/27/2012  . History of pericarditis, with pericardial window in 2009 11/27/2012  . SOB (shortness of breath) 11/27/2012  . Hypothyroidism 11/27/2012  . History of partial nephrectomy 11/27/2012  . Hx of ovarian cancer 11/27/2012  . Venous insufficiency 11/27/2012  . Chronic back pain 11/27/2012  . Hx of melanoma of skin 11/27/2012  . 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 H/O iron deficiency anemia   . Fibromyalgia   .  Arthritis   . CHF (congestive heart failure)   . Chronic kidney disease   . Anemia   . Anxiety   . Heart murmur    Past Surgical History  Procedure Laterality Date  . Joint replacement    . Abdominal hysterectomy    . Total abdominal hysterectomy w/ bilateral salpingoophorectomy    . Appendectomy    . Breast surgery    . Spine surgery    . Fracture surgery    . Tubal ligation    . Doppler echocardiography  02/23/2011    LVEF>50%  . Appendectomy      FAMHx: Family History  Problem Relation Age of Onset  . Cancer Mother   . Cancer Father   . Bipolar disorder Sister     SOCHx:  reports that she quit smoking about 27 years ago. Her smoking use included Cigarettes. She smoked 0.00 packs per day for 18 years. She does not have any smokeless tobacco history on file. She reports that she does not drink alcohol or use illicit drugs.  ALLERGIES: Allergies  Allergen Reactions  . Ampicillin Rash    ROS: A comprehensive review of systems was negative except for: Constitutional: positive for anorexia, chills, fatigue, malaise and sweats Respiratory: positive for dyspnea on exertion and pleurisy/chest pain Cardiovascular: positive for chest pain and tachycardia Integument/breast: positive for healing second degree burn to the right shoulder above the clavicle, measuring 1 x 2 cm  HOME MEDICATIONS: Prescriptions prior to  admission  Medication Sig Dispense Refill  . acetaminophen (TYLENOL) 325 MG tablet Take 2 tablets (650 mg total) by mouth every 4 (four) hours as needed.      Marland Kitchen albuterol (PROVENTIL HFA;VENTOLIN HFA) 108 (90 BASE) MCG/ACT inhaler Inhale 2 puffs into the lungs every 4 (four) hours as needed for wheezing (cough, shortness of breath or wheezing.).  1 Inhaler  1  . Ascorbic Acid (VITAMIN C PO) Take 1 tablet by mouth daily.      Marland Kitchen aspirin EC 81 MG EC tablet Take 1 tablet (81 mg total) by mouth daily.      Marland Kitchen atorvastatin (LIPITOR) 10 MG tablet Take 1 tablet (10 mg total)  by mouth daily at 6 PM.  30 tablet  5  . buPROPion (WELLBUTRIN SR) 150 MG 12 hr tablet TAKE 1 TABLET BY MOUTH ONCE DAILY.  30 tablet  2  . Cyanocobalamin (VITAMIN B-12 PO) Take 1 tablet by mouth daily.      . cyclobenzaprine (FLEXERIL) 5 MG tablet Take 5 mg by mouth 3 (three) times daily as needed. For spasms      . diazepam (VALIUM) 5 MG tablet Take 1 tablet (5 mg total) by mouth once.  1 tablet  0  . diazepam (VALIUM) 5 MG tablet Take 1 tablet (5 mg total) by mouth every 8 (eight) hours as needed for anxiety. For anxiety  90 tablet  3  . diclofenac sodium (VOLTAREN) 1 % GEL Apply 2 g topically as needed. For pain      . Diethylpropion HCl CR 75 MG TB24 Take 75 mg by mouth Daily.      . folic acid (FOLVITE) 1 MG tablet Take 1 mg by mouth daily.      Marland Kitchen HYDROcodone-acetaminophen (NORCO) 10-325 MG per tablet Take 1 tablet by mouth every 6 (six) hours as needed. For pain      . levothyroxine (SYNTHROID, LEVOTHROID) 100 MCG tablet Take 1 tablet (100 mcg total) by mouth daily.  30 tablet  11  . Methotrexate, PF, 25 MG/0.4ML SOAJ Inject 25 mg into the skin once a week.      . metoprolol tartrate (LOPRESSOR) 12.5 mg TABS Take 0.5 tablets (12.5 mg total) by mouth 2 (two) times daily.  30 tablet  11  . morphine (MS CONTIN) 30 MG 12 hr tablet Take 30 mg by mouth 2 (two) times daily.      . Multiple Vitamin (MULTIVITAMIN WITH MINERALS) TABS Take 1 tablet by mouth daily.      . predniSONE (DELTASONE) 5 MG tablet Take 2 tablets by mouth daily.      Marland Kitchen rOPINIRole (REQUIP) 4 MG tablet Take 4 mg by mouth at bedtime.      . triamterene-hydrochlorothiazide (MAXZIDE) 75-50 MG per tablet Take 1 tablet by mouth daily.      Marland Kitchen zolpidem (AMBIEN) 5 MG tablet TAKE 1 TABLET BY MOUTH AT BEDTIME AS NEEDED FOR SLEEP  30 tablet  5    HOSPITAL MEDICATIONS: Prior to Admission:  Prescriptions prior to admission  Medication Sig Dispense Refill  . acetaminophen (TYLENOL) 325 MG tablet Take 2 tablets (650 mg total) by mouth  every 4 (four) hours as needed.      Marland Kitchen albuterol (PROVENTIL HFA;VENTOLIN HFA) 108 (90 BASE) MCG/ACT inhaler Inhale 2 puffs into the lungs every 4 (four) hours as needed for wheezing (cough, shortness of breath or wheezing.).  1 Inhaler  1  . Ascorbic Acid (VITAMIN C PO) Take 1 tablet by mouth  daily.      . aspirin EC 81 MG EC tablet Take 1 tablet (81 mg total) by mouth daily.      Marland Kitchen atorvastatin (LIPITOR) 10 MG tablet Take 1 tablet (10 mg total) by mouth daily at 6 PM.  30 tablet  5  . buPROPion (WELLBUTRIN SR) 150 MG 12 hr tablet TAKE 1 TABLET BY MOUTH ONCE DAILY.  30 tablet  2  . Cyanocobalamin (VITAMIN B-12 PO) Take 1 tablet by mouth daily.      . cyclobenzaprine (FLEXERIL) 5 MG tablet Take 5 mg by mouth 3 (three) times daily as needed. For spasms      . diazepam (VALIUM) 5 MG tablet Take 1 tablet (5 mg total) by mouth once.  1 tablet  0  . diazepam (VALIUM) 5 MG tablet Take 1 tablet (5 mg total) by mouth every 8 (eight) hours as needed for anxiety. For anxiety  90 tablet  3  . diclofenac sodium (VOLTAREN) 1 % GEL Apply 2 g topically as needed. For pain      . Diethylpropion HCl CR 75 MG TB24 Take 75 mg by mouth Daily.      . folic acid (FOLVITE) 1 MG tablet Take 1 mg by mouth daily.      Marland Kitchen HYDROcodone-acetaminophen (NORCO) 10-325 MG per tablet Take 1 tablet by mouth every 6 (six) hours as needed. For pain      . levothyroxine (SYNTHROID, LEVOTHROID) 100 MCG tablet Take 1 tablet (100 mcg total) by mouth daily.  30 tablet  11  . Methotrexate, PF, 25 MG/0.4ML SOAJ Inject 25 mg into the skin once a week.      . metoprolol tartrate (LOPRESSOR) 12.5 mg TABS Take 0.5 tablets (12.5 mg total) by mouth 2 (two) times daily.  30 tablet  11  . morphine (MS CONTIN) 30 MG 12 hr tablet Take 30 mg by mouth 2 (two) times daily.      . Multiple Vitamin (MULTIVITAMIN WITH MINERALS) TABS Take 1 tablet by mouth daily.      . predniSONE (DELTASONE) 5 MG tablet Take 2 tablets by mouth daily.      Marland Kitchen rOPINIRole  (REQUIP) 4 MG tablet Take 4 mg by mouth at bedtime.      . triamterene-hydrochlorothiazide (MAXZIDE) 75-50 MG per tablet Take 1 tablet by mouth daily.      Marland Kitchen zolpidem (AMBIEN) 5 MG tablet TAKE 1 TABLET BY MOUTH AT BEDTIME AS NEEDED FOR SLEEP  30 tablet  5    VITALS: Blood pressure 115/62, pulse 104, temperature 98.3 F (36.8 C), temperature source Oral, resp. rate 18, height 5\' 6"  (1.676 m), weight 152 lb 8 oz (69.174 kg), SpO2 90.00%.  PHYSICAL EXAM: General appearance: alert, but appears fatigued and in pain Neck: no carotid bruit and no JVD Lungs: diminished breath sounds RUL Heart: regular tachycardia Abdomen: soft, non-tender; bowel sounds normal; no masses,  no organomegaly Extremities: extremities normal, atraumatic, no cyanosis or edema Pulses: 2+ and symmetric Skin: Skin color is bright red across the chest, very warm to the touch, there is a 1x2 cm healing 2nd degree burn on the right shoulder above the clavicle Neurologic: Grossly normal Psych: Appears tired, upset  LABS: Results for orders placed during the hospital encounter of 08/19/13 (from the past 48 hour(s))  CBC     Status: Abnormal   Collection Time    08/19/13  3:00 PM      Result Value Range   WBC 15.1 (*) 4.0 -  10.5 K/uL   RBC 3.99  3.87 - 5.11 MIL/uL   Hemoglobin 12.7  12.0 - 15.0 g/dL   HCT 28.4  13.2 - 44.0 %   MCV 93.0  78.0 - 100.0 fL   MCH 31.8  26.0 - 34.0 pg   MCHC 34.2  30.0 - 36.0 g/dL   RDW 10.2  72.5 - 36.6 %   Platelets 145 (*) 150 - 400 K/uL  BASIC METABOLIC PANEL     Status: Abnormal   Collection Time    08/19/13  3:00 PM      Result Value Range   Sodium 139  135 - 145 mEq/L   Potassium 3.5  3.5 - 5.1 mEq/L   Chloride 100  96 - 112 mEq/L   CO2 26  19 - 32 mEq/L   Glucose, Bld 105 (*) 70 - 99 mg/dL   BUN 34 (*) 6 - 23 mg/dL   Creatinine, Ser 4.40 (*) 0.50 - 1.10 mg/dL   Calcium 9.3  8.4 - 34.7 mg/dL   GFR calc non Af Amer 42 (*) >90 mL/min   GFR calc Af Amer 48 (*) >90 mL/min    Comment: (NOTE)     The eGFR has been calculated using the CKD EPI equation.     This calculation has not been validated in all clinical situations.     eGFR's persistently <90 mL/min signify possible Chronic Kidney     Disease.  PRO B NATRIURETIC PEPTIDE     Status: Abnormal   Collection Time    08/19/13  3:00 PM      Result Value Range   Pro B Natriuretic peptide (BNP) 296.8 (*) 0 - 125 pg/mL  POCT I-STAT TROPONIN I     Status: None   Collection Time    08/19/13  3:16 PM      Result Value Range   Troponin i, poc 0.00  0.00 - 0.08 ng/mL   Comment 3            Comment: Due to the release kinetics of cTnI,     a negative result within the first hours     of the onset of symptoms does not rule out     myocardial infarction with certainty.     If myocardial infarction is still suspected,     repeat the test at appropriate intervals.    IMAGING: Dg Chest 2 View  08/19/2013   CLINICAL DATA:  Shortness of breath, fever  EXAM: CHEST  2 VIEW  COMPARISON:  04/04/2013  FINDINGS: Cardiomediastinal silhouette is stable. There is patchy infiltrate/ pneumonia in right upper lobe. Follow-up to resolution after appropriate treatment is recommended. Bony thorax is stable. Left shoulder prosthesis again noted.  IMPRESSION: Patchy infiltrate/ pneumonia in right upper lobe. Follow-up to resolution after appropriate treatment is recommended.   Electronically Signed   By: Natasha Mead M.D.   On: 08/19/2013 15:43    HOSPITAL DIAGNOSES: Principal Problem:   Pneumonia, organism unspecified Active Problems:   Chest pain at rest, rule out pericarditis   History of pericarditis, with pericardial window in 2009, and recurrent episodes 2011,2013, treated with methotrexate   IMPRESSION: 1. Right upper lobe infiltrate, suspicious for pneumonia 2. Pericarditis flare not suspected (pro BNP 296)  RECOMMENDATION: 1. [name] is having pleuritic chest pain, which was similar to her pericarditis in the past,  but is more likely explained by pneumonia. I would recommend medicine admission and antibiotic therapy for this. I would continue  her methotrexate and treat her pain with analgesics and NSAIDs.  I will be available as necessary for new cardiac issues if they arise.  Time Spent Directly with Patient: 30 minutes  Chrystie Nose, MD, [place] Attending Cardiologist [facility]  HILTY,Kenneth C 08/19/2013, 5:54 PM

## 2013-08-19 NOTE — ED Notes (Signed)
Pt followed up with pain clinic and normaly takes lot of pain medication.Pt asking for pain medication.

## 2013-08-20 ENCOUNTER — Inpatient Hospital Stay (HOSPITAL_COMMUNITY): Payer: Medicare Other

## 2013-08-20 ENCOUNTER — Encounter (HOSPITAL_COMMUNITY): Payer: Self-pay | Admitting: Radiology

## 2013-08-20 DIAGNOSIS — Z85820 Personal history of malignant melanoma of skin: Secondary | ICD-10-CM

## 2013-08-20 DIAGNOSIS — Z8543 Personal history of malignant neoplasm of ovary: Secondary | ICD-10-CM

## 2013-08-20 LAB — BASIC METABOLIC PANEL
BUN: 31 mg/dL — ABNORMAL HIGH (ref 6–23)
CO2: 26 mEq/L (ref 19–32)
Calcium: 8.5 mg/dL (ref 8.4–10.5)
Chloride: 97 mEq/L (ref 96–112)
Creatinine, Ser: 1.15 mg/dL — ABNORMAL HIGH (ref 0.50–1.10)
GFR calc Af Amer: 57 mL/min — ABNORMAL LOW (ref >90)
GFR calc non Af Amer: 50 mL/min — ABNORMAL LOW (ref >90)
Glucose, Bld: 110 mg/dL — ABNORMAL HIGH (ref 70–99)
Potassium: 3.1 mEq/L — ABNORMAL LOW (ref 3.5–5.1)
Sodium: 136 mEq/L (ref 135–145)

## 2013-08-20 LAB — HEMOGLOBIN A1C
Hgb A1c MFr Bld: 5.8 % — ABNORMAL HIGH (ref <5.7)
Mean Plasma Glucose: 120 mg/dL — ABNORMAL HIGH (ref <117)

## 2013-08-20 LAB — CBC
HCT: 33.9 % — ABNORMAL LOW (ref 36.0–46.0)
Hemoglobin: 11.8 g/dL — ABNORMAL LOW (ref 12.0–15.0)
MCH: 32.5 pg (ref 26.0–34.0)
MCHC: 34.8 g/dL (ref 30.0–36.0)
MCV: 93.4 fL (ref 78.0–100.0)
Platelets: 139 10*3/uL — ABNORMAL LOW (ref 150–400)
RBC: 3.63 MIL/uL — ABNORMAL LOW (ref 3.87–5.11)
RDW: 14.7 % (ref 11.5–15.5)
WBC: 29.2 10*3/uL — ABNORMAL HIGH (ref 4.0–10.5)

## 2013-08-20 LAB — TROPONIN I
Troponin I: <0.3 ng/mL (ref <0.30)
Troponin I: <0.3 ng/mL (ref <0.30)

## 2013-08-20 LAB — LACTIC ACID, PLASMA: Lactic Acid, Venous: 0.8 mmol/L (ref 0.5–2.2)

## 2013-08-20 LAB — TSH: TSH: 0.153 u[IU]/mL — ABNORMAL LOW (ref 0.350–4.500)

## 2013-08-20 LAB — GLUCOSE, CAPILLARY: Glucose-Capillary: 106 mg/dL — ABNORMAL HIGH (ref 70–99)

## 2013-08-20 MED ORDER — POTASSIUM CHLORIDE CRYS ER 20 MEQ PO TBCR
40.0000 meq | EXTENDED_RELEASE_TABLET | Freq: Two times a day (BID) | ORAL | Status: AC
  Administered 2013-08-20: 40 meq via ORAL
  Filled 2013-08-20: qty 2

## 2013-08-20 MED ORDER — ENSURE COMPLETE PO LIQD
237.0000 mL | Freq: Two times a day (BID) | ORAL | Status: DC
  Administered 2013-08-20 – 2013-08-21 (×2): 237 mL via ORAL

## 2013-08-20 MED ORDER — SODIUM CHLORIDE 0.9 % IV BOLUS (SEPSIS)
500.0000 mL | Freq: Once | INTRAVENOUS | Status: AC
  Administered 2013-08-20: 500 mL via INTRAVENOUS

## 2013-08-20 MED ORDER — IOHEXOL 300 MG/ML  SOLN
75.0000 mL | Freq: Once | INTRAMUSCULAR | Status: AC | PRN
  Administered 2013-08-20: 75 mL via INTRAVENOUS

## 2013-08-20 MED ORDER — PIPERACILLIN-TAZOBACTAM 3.375 G IVPB
3.3750 g | Freq: Three times a day (TID) | INTRAVENOUS | Status: DC
  Filled 2013-08-20 (×2): qty 50

## 2013-08-20 MED ORDER — CHLORHEXIDINE GLUCONATE CLOTH 2 % EX PADS
6.0000 | MEDICATED_PAD | Freq: Every day | CUTANEOUS | Status: DC
  Administered 2013-08-21: 6 via TOPICAL

## 2013-08-20 MED ORDER — KETOROLAC TROMETHAMINE 15 MG/ML IJ SOLN
30.0000 mg | Freq: Once | INTRAMUSCULAR | Status: AC
  Administered 2013-08-20: 30 mg via INTRAVENOUS
  Filled 2013-08-20: qty 2

## 2013-08-20 MED ORDER — VANCOMYCIN HCL IN DEXTROSE 750-5 MG/150ML-% IV SOLN
750.0000 mg | Freq: Two times a day (BID) | INTRAVENOUS | Status: DC
  Administered 2013-08-20 – 2013-08-21 (×3): 750 mg via INTRAVENOUS
  Filled 2013-08-20 (×5): qty 150

## 2013-08-20 MED ORDER — DEXTROSE 5 % IV SOLN
2.0000 g | INTRAVENOUS | Status: DC
  Administered 2013-08-20 – 2013-08-21 (×2): 2 g via INTRAVENOUS
  Filled 2013-08-20 (×2): qty 2

## 2013-08-20 MED ORDER — CHLORHEXIDINE GLUCONATE CLOTH 2 % EX PADS
6.0000 | MEDICATED_PAD | Freq: Every day | CUTANEOUS | Status: DC
  Administered 2013-08-20: 6 via TOPICAL

## 2013-08-20 MED ORDER — MUPIROCIN 2 % EX OINT
1.0000 "application " | TOPICAL_OINTMENT | Freq: Two times a day (BID) | CUTANEOUS | Status: DC
  Administered 2013-08-20 (×2): 1 via NASAL

## 2013-08-20 MED ORDER — MUPIROCIN 2 % EX OINT
1.0000 "application " | TOPICAL_OINTMENT | Freq: Two times a day (BID) | CUTANEOUS | Status: DC
  Administered 2013-08-20 – 2013-08-21 (×2): 1 via NASAL
  Filled 2013-08-20: qty 22

## 2013-08-20 NOTE — Progress Notes (Signed)
Met with Mrs Hepp and husband at bedside to explain [facility] Care Management services. Consents signed. Explained to patient she will receive post hospital discharge call and will be evaluated for monthly home visits. Explained that [facility] Care Management will not interfere with any services arranged by inpatient RNCM. Left [facility] Care Management packet at bedside and confirmed best contact information. Appreciative of visit. Raiford Noble, MSN-Ed, RN,BSN- [facility] [phone]

## 2013-08-20 NOTE — Progress Notes (Signed)
ANTIBIOTIC CONSULT NOTE - INITIAL  Pharmacy Consult for Vancomycin/Cefepime Indication: pneumonia, RUL  Allergies  Allergen Reactions  . Ampicillin Rash    Patient Measurements: Height: 5\' 6"  (167.6 cm) Weight: 152 lb 8 oz (69.174 kg) IBW/kg (Calculated) : 59.3  Vital Signs: Temp: 98.7 F (37.1 C) (10/29 0536) Temp src: Oral (10/29 0536) BP: 105/67 mmHg (10/29 0536) Pulse Rate: 85 (10/29 0536)  Labs:  Recent Labs  08/19/13 1500 08/19/13 2013 08/20/13 0155  WBC 15.1* 27.7* 29.2*  HGB 12.7 10.9* 11.8*  PLT 145* 149* 139*  CREATININE 1.33* 1.24* 1.15*   Estimated Creatinine Clearance: 46.9 ml/min (by C-G formula based on Cr of 1.15).  Microbiology: Recent Results (from the past 720 hour(s))  MRSA PCR SCREENING     Status: Abnormal   Collection Time    08/19/13  7:13 PM      Result Value Range Status   MRSA by PCR POSITIVE (*) NEGATIVE Final   Comment:            The GeneXpert MRSA Assay (FDA     approved for NASAL specimens     only), is one component of a     comprehensive MRSA colonization     surveillance program. It is not     intended to diagnose MRSA     infection nor to guide or     monitor treatment for     MRSA infections.     RESULT CALLED TO, READ BACK BY AND VERIFIED WITHBridget Hartshorn RN 2227 08/19/13 A BROWNING    Medical History: Past Medical History  Diagnosis Date  . Chest pain at rest, rule out pericarditis 11/27/2012  . History of pericarditis, with pericardial window in 2009 11/27/2012  . Hypothyroidism 11/27/2012  . Venous insufficiency 11/27/2012  . Hx of melanoma of skin 11/27/2012  . Fibromyalgia   . CHF (congestive heart failure)   . Chronic kidney disease   . Anxiety   . Heart murmur   . Complication of anesthesia     "woke up twice during knee replacement" (08/19/2013)  . Pneumonia     "several times; including today", RUL/notes 08/19/2013  . SOB (shortness of breath)     "can happen at anytime; it's not all the time"  (08/19/2013)  . Iron deficiency anemia   . Anemia   . Headache(784.0)     "felt like I've had one all day today; usually get headaches under stressful situations" (08/19/2013)  . Migraines     "maybe q 3-4 months" (08/19/2013)  . Arthritis     "everywhere" (08/19/2013)  . Chronic lower back pain   . Depression   . Hx of ovarian cancer 11/27/2012  . Kidney carcinoma     "both kidneys; ~ 3 yr apart" (08/19/2013)  . Melanoma of back   . Squamous carcinoma     "face, side of my nose, chest; used acid to get rid of them" (08/19/2013)    Assessment: 62 y/o F here with pleuritic CP, CXR with infiltrate in RUL, WBC 29.2<27.7, Tmax 100.1, Scr 1.15 with CrCl ~ 45-50. Reports allergy to ampicillin (rash)-tolerated dose of ceftriaxone.   10/29 Vanco>> 10/29 Zosyn>> 10/28 CTX x 1 10/28 Azithro x 1  Goal of Therapy:  Vancomycin trough level 15-20 mcg/ml  Plan:  -Vancomycin 750 mg IV q12h -Cefepime 2g IV q24h -Trend WBC, temp, renal function  Thank you for allowing me to take part in this patient's care,  Abran Duke,  PharmD Clinical Pharmacist Phone: [phone] Pager: [phone] 08/20/2013 7:04 AM

## 2013-08-20 NOTE — Care Management Note (Signed)
    Page 1 of 1   08/21/2013     12:51:58 PM   CARE MANAGEMENT NOTE 08/21/2013  Patient:  [name], [name]   Account Number:  [id number]  Date Initiated:  08/20/2013  Documentation initiated by:  Letha Cape  Subjective/Objective Assessment:   dx pna,hx of pericarditis  admit- from [facility] - indep living. pta indep.     Action/Plan:   Anticipated DC Date:  08/21/2013   Anticipated DC Plan:  HOME/SELF CARE      DC Planning Services  CM consult      Choice offered to / List presented to:             Status of service:  Completed, signed off Medicare Important Message given?   (If response is "NO", the following Medicare IM given date fields will be blank) Date Medicare IM given:   Date Additional Medicare IM given:    Discharge Disposition:  HOME/SELF CARE  Per UR Regulation:  Reviewed for med. necessity/level of care/duration of stay  If discussed at Long Length of Stay Meetings, dates discussed:    Comments:  08/21/13 12:48 Letha Cape RN, BSN [phone] patient is from [facility] indep living, [facility] indep. Patient has medication coverage and transportation at dc. No needs anticipated. Pateint for dc today.

## 2013-08-20 NOTE — Progress Notes (Addendum)
INITIAL NUTRITION ASSESSMENT  DOCUMENTATION CODES Per approved criteria  -Severe malnutrition in the context of chronic illness   INTERVENTION: Add Ensure Complete po BID, each supplement provides 350 kcal and 13 grams of protein. RD to continue to follow nutrition care plan.  NUTRITION DIAGNOSIS: Inadequate oral intake related to poor appetite as evidenced by pt report.   Goal: Intake to meet >90% of estimated nutrition needs.  Monitor:  weight trends, lab trends, I/O's, PO intake, supplement tolerance  Reason for Assessment: Malnutrition Screening Tool  62 y.o. female  Admitting Dx: Pneumonia, organism unspecified  ASSESSMENT: PMHx significant for pericarditis, hypothyroidism, depression. Admitted with chest pain, SOB and fever. Work-up reveals PNA.  RD drawn to patient 2/2 malnutrition screening tool. Pt reports 48 lb wt loss since last year. Per chart review, pt with 23% wt loss x 1.5 years which is significant for time frame. Pt reports that at first she was trying to lose weight but then she lost her appetite. She states that she has a big house and does lots of walking but doubts that she gets in at least 800 kcal daily. Pt appears thin with obvious muscle and fat mass loss.  She reports that she ate 3 bites of her breakfast this morning. Has tried Ensure in the past and likes it okay, agreeable to receiving it while here.  Nutrition Focused Physical Exam:  Subcutaneous Fat:  Orbital Region: moderate wasting Upper Arm Region: severe wasting Thoracic and Lumbar Region: moderate wasting  Muscle:  Temple Region: severe wasting Clavicle Bone Region: severe wasting Clavicle and Acromion Bone Region: n/a Scapular Bone Region: n/a Dorsal Hand: n/a Patellar Region: n/a Anterior Thigh Region: n/a Posterior Calf Region: n/a  Edema: none  Pt meets criteria for severe MALNUTRITION in the context of chronic illness as evidenced by severe fat and muscle mass  loss.  Potassium is currently low at 3.1.  Height: Ht Readings from Last 1 Encounters:  08/19/13 5\' 6"  (1.676 m)    Weight: Wt Readings from Last 1 Encounters:  08/19/13 152 lb 8 oz (69.174 kg)    Ideal Body Weight: 130 lb  % Ideal Body Weight: 117%  Wt Readings from Last 10 Encounters:  08/19/13 152 lb 8 oz (69.174 kg)  05/20/13 155 lb (70.308 kg)  04/04/13 154 lb (69.854 kg)  03/19/13 149 lb 12.8 oz (67.949 kg)  01/14/13 151 lb 3.2 oz (68.584 kg)  12/19/12 155 lb (70.308 kg)  11/28/12 162 lb 3.2 oz (73.573 kg)  09/10/12 159 lb 12.8 oz (72.485 kg)  03/19/12 197 lb 8 oz (89.585 kg)  12/27/09 209 lb 6.4 oz (94.983 kg)    Usual Body Weight: 197 lb  % Usual Body Weight: 77%  BMI:  Body mass index is 24.63 kg/(m^2). WNL  Estimated Nutritional Needs: Kcal: 1700 - 2000 Protein: 60 - 70 g Fluid: 1.7 - 2 liters  Skin: burn on R shoulder from curling iron  Diet Order: Cardiac  EDUCATION NEEDS: -No education needs identified at this time  No intake or output data in the 24 hours ending 08/20/13 1010  Last BM: 10/27  Labs:   Recent Labs Lab 08/19/13 1500 08/19/13 2013 08/20/13 0155  NA 139  --  136  K 3.5  --  3.1*  CL 100  --  97  CO2 26  --  26  BUN 34*  --  31*  CREATININE 1.33* 1.24* 1.15*  CALCIUM 9.3  --  8.5  GLUCOSE 105*  --  110*    CBG (last 3)   Recent Labs  08/19/13 2148 08/20/13 0739  GLUCAP 129* 106*    Scheduled Meds: . aspirin EC  81 mg Oral Daily  . atorvastatin  10 mg Oral q1800  . buPROPion  150 mg Oral Daily  . ceFEPime (MAXIPIME) IV  2 g Intravenous Q24H  . Chlorhexidine Gluconate Cloth  6 each Topical Q0600  . docusate sodium  100 mg Oral BID  . heparin  5,000 Units Subcutaneous Q8H  . levothyroxine  100 mcg Oral QAC breakfast  . metoprolol tartrate  12.5 mg Oral BID  . morphine  30 mg Oral BID  . mupirocin ointment  1 application Nasal BID  . potassium chloride  40 mEq Oral BID  . rOPINIRole  4 mg Oral QHS  .  senna  1 tablet Oral BID  . sodium chloride  3 mL Intravenous Q12H  . triamterene-hydrochlorothiazide  1 tablet Oral Daily  . vancomycin  750 mg Intravenous Q12H    Continuous Infusions:   Past Medical History  Diagnosis Date  . Chest pain at rest, rule out pericarditis 11/27/2012  . History of pericarditis, with pericardial window in 2009 11/27/2012  . Hypothyroidism 11/27/2012  . Venous insufficiency 11/27/2012  . Hx of melanoma of skin 11/27/2012  . Fibromyalgia   . CHF (congestive heart failure)   . Chronic kidney disease   . Anxiety   . Heart murmur   . Complication of anesthesia     "woke up twice during knee replacement" (08/19/2013)  . Pneumonia     "several times; including today", RUL/notes 08/19/2013  . SOB (shortness of breath)     "can happen at anytime; it's not all the time" (08/19/2013)  . Iron deficiency anemia   . Anemia   . Headache(784.0)     "felt like I've had one all day today; usually get headaches under stressful situations" (08/19/2013)  . Migraines     "maybe q 3-4 months" (08/19/2013)  . Arthritis     "everywhere" (08/19/2013)  . Chronic lower back pain   . Depression   . Hx of ovarian cancer 11/27/2012  . Kidney carcinoma     "both kidneys; ~ 3 yr apart" (08/19/2013)  . Melanoma of back   . Squamous carcinoma     "face, side of my nose, chest; used acid to get rid of them" (08/19/2013)    Past Surgical History  Procedure Laterality Date  . Joint replacement    . Left oophorectomy Left 1979  . Lumbar disc surgery  1984    "ruptured disc"  . Fracture surgery    . Doppler echocardiography  02/23/2011    LVEF>50%  . Partial nephrectomy Right 2005; ~ 2008  . Pericardial window  ~ 2012  . Tonsillectomy  1954  . Appendectomy    . Carpal tunnel release Right ~ 1992  . Tubal ligation  1952  . Reduction mammaplasty Bilateral ~ 2008  . Shoulder arthroscopy w/ rotator cuff repair Right 1990's  . Total shoulder replacement Left 2006?  Marland Kitchen Total knee  arthroplasty Right 1990's  . Knee arthroscopy Right     "twice before the replacement" (08/19/2013)  . Abdominal hysterectomy  1979  . Right oophorectomy  ~ 2007    "it had cancer in it" (08/19/2013)  . Posterior lumbar fusion  2002?; 03/2012  . Melanoma excision  ~ 2010    "my lower back" (08/19/2013)    Jarold Motto MS, RD, LDN Pager:  [phone] After-hours pager: [phone]

## 2013-08-20 NOTE — Progress Notes (Signed)
Family Medicine Teaching Service Daily Progress Note Intern Pager: [phone]  Patient name: [name] Medical record number: [id number] Date of birth: October 24, 1950 Age: 62 y.o. Gender: female  Primary Care Provider: [name], MD Consultants: cardiology Code Status: full  Pt Overview and Major Events to Date:  10/28: admitted for pleuritic chest with PNA on CXR  Assessment and Plan: [name] is a 62 y.o. female presenting with chest pain, shortness of breath and fever. PMH is significant for pericarditis.   # CAP. Seen on CXR.  Correlates with clinical picture.  No O2 requirement. - white count is rising - will broaden antibiotics to vanc and cefepime - continue albuterol prn  # Pleuritic chest pain. Likely explained by pneumonia.  Does have h/o pericarditis; has been seen by cardiology who think the pneumonia is the more likely cause.  Tachycardia is resolved and there no signs of DVT making PE unlikely. - troponins negative x3 - am EKG pending - A1c and TSH pending - started on lipitor 10mg  this admission  - continue methotrexate for recurrent pericarditis - will give toradol 30mg  IV x1 to see that helps with the pain  # Chronic pain.  Stable. - Continue home pain medications and monitor for over-sedation.  - Senna and ducosate for anticipated constipation   # Depression/anxiety/insomnia. Stable. - Continue wellbutrin, valium, and ambien   # Dysuria and urinary frequency  - UA ordered but not yet collected  # Elevated creatinine. Seems to be near baseline, so suspect some CKD.  - No IVF as can tolerate po.  - monitor with daily BMP   # Hypothyroidism:  - continue home dose of synthroid - TSH pending  # History of MRSA infection  - Contact isolation  FEN/GI: SLIV; cardiac diet; K repleted PPx: SQ heparin  Disposition: pending clinical improvement  Subjective: Reports feeling about the same today.  States the chest pain that radiates to her back  is the same as yesterday.  Also feeling short of breath this morning.  She was able to sleep okay last night.  Objective: Temp:  [98.3 F (36.8 C)-100.1 F (37.8 C)] 98.7 F (37.1 C) (10/29 0536) Pulse Rate:  [85-128] 85 (10/29 0536) Resp:  [15-24] 18 (10/29 0536) BP: (91-138)/(48-67) 105/67 mmHg (10/29 0536) SpO2:  [90 %-99 %] 99 % (10/29 0536) Weight:  [151 lb 9.6 oz (68.765 kg)-152 lb 8 oz (69.174 kg)] 152 lb 8 oz (69.174 kg) (10/28 1722) Physical Exam: General: sleeping but rousable, NAD, cooperative Cardiovascular: RRR, no murmurs Respiratory: decreased air movement on right; no wheezes or rales Extremities: no edema, no calf tenderness  Laboratory:  Recent Labs Lab 08/19/13 1500 08/19/13 2013 08/20/13 0155  WBC 15.1* 27.7* 29.2*  HGB 12.7 10.9* 11.8*  HCT 37.1 31.5* 33.9*  PLT 145* 149* 139*    Recent Labs Lab 08/19/13 1500 08/19/13 2013 08/20/13 0155  NA 139  --  136  K 3.5  --  3.1*  CL 100  --  97  CO2 26  --  26  BUN 34*  --  31*  CREATININE 1.33* 1.24* 1.15*  CALCIUM 9.3  --  8.5  GLUCOSE 105*  --  110*   Troponins neg x3  Imaging/Diagnostic Tests: CXR (10/28): Patchy infiltrate/ pneumonia in right upper lobe. Follow-up to  resolution after appropriate treatment is recommended.   Charm Rings, MD 08/20/2013, 6:08 AM PGY-3, [facility] FPTS Intern pager: [phone], text pages welcome

## 2013-08-21 LAB — URINE MICROSCOPIC-ADD ON

## 2013-08-21 LAB — BASIC METABOLIC PANEL
BUN: 27 mg/dL — ABNORMAL HIGH (ref 6–23)
CO2: 27 mEq/L (ref 19–32)
Calcium: 8.9 mg/dL (ref 8.4–10.5)
Chloride: 99 mEq/L (ref 96–112)
Creatinine, Ser: 0.98 mg/dL (ref 0.50–1.10)
GFR calc Af Amer: 70 mL/min — ABNORMAL LOW (ref >90)
GFR calc non Af Amer: 60 mL/min — ABNORMAL LOW (ref >90)
Glucose, Bld: 130 mg/dL — ABNORMAL HIGH (ref 70–99)
Potassium: 3.6 mEq/L (ref 3.5–5.1)
Sodium: 137 mEq/L (ref 135–145)

## 2013-08-21 LAB — URINALYSIS, ROUTINE W REFLEX MICROSCOPIC
Bilirubin Urine: NEGATIVE
Glucose, UA: NEGATIVE mg/dL
Ketones, ur: 15 mg/dL — AB
Nitrite: NEGATIVE
Protein, ur: NEGATIVE mg/dL
Specific Gravity, Urine: 1.037 — ABNORMAL HIGH (ref 1.005–1.030)
Urobilinogen, UA: 0.2 mg/dL (ref 0.0–1.0)
pH: 5 (ref 5.0–8.0)

## 2013-08-21 LAB — CBC
HCT: 31 % — ABNORMAL LOW (ref 36.0–46.0)
Hemoglobin: 10.5 g/dL — ABNORMAL LOW (ref 12.0–15.0)
MCH: 31.6 pg (ref 26.0–34.0)
MCHC: 33.9 g/dL (ref 30.0–36.0)
MCV: 93.4 fL (ref 78.0–100.0)
Platelets: 149 10*3/uL — ABNORMAL LOW (ref 150–400)
RBC: 3.32 MIL/uL — ABNORMAL LOW (ref 3.87–5.11)
RDW: 14.5 % (ref 11.5–15.5)
WBC: 18 10*3/uL — ABNORMAL HIGH (ref 4.0–10.5)

## 2013-08-21 MED ORDER — LEVOFLOXACIN 500 MG PO TABS: 500.0000 mg | ORAL_TABLET | Freq: Every day | ORAL | Status: DC

## 2013-08-21 MED ORDER — LEVOFLOXACIN 750 MG PO TABS: 750.0000 mg | ORAL_TABLET | Freq: Every day | ORAL | Status: DC

## 2013-08-21 MED ORDER — LEVOFLOXACIN 750 MG PO TABS
750.0000 mg | ORAL_TABLET | Freq: Every day | ORAL | Status: DC
  Filled 2013-08-21: qty 1

## 2013-08-21 MED ORDER — LEVOFLOXACIN 500 MG PO TABS
500.0000 mg | ORAL_TABLET | Freq: Every day | ORAL | Status: DC
  Filled 2013-08-21: qty 1

## 2013-08-21 NOTE — Progress Notes (Signed)
Family Medicine Teaching Service Daily Progress Note Intern Pager: [phone]  Patient name: [name] Medical record number: [id number] Date of birth: September 03, 1951 Age: 62 y.o. Gender: female  Primary Care Provider: [name], MD Consultants: cardiology Code Status: full  Pt Overview and Major Events to Date:  10/28: admitted for pleuritic chest with PNA on CXR  Assessment and Plan: [name] is a 62 y.o. female presenting with chest pain, shortness of breath and fever. PMH is significant for pericarditis.   # CAP. Seen on CXR.  Correlates with clinical picture.  No O2 requirement. - white count down significantly from yesterday 29 > 18 - Transition IV abx to po levaquin for a prolonged course - continue albuterol prn - Repeat CXR in 8 weeks to monitor resolution  # Pleuritic chest pain. Likely explained by pneumonia.  Does have h/o pericarditis; has been seen by cardiology who think the pneumonia is the more likely cause. Tachycardia is resolved and there are no signs of DVT making PE unlikely. - troponins negative x3 - A1c 5.8 - started on lipitor 10mg  this admission  - continue methotrexate for recurrent pericarditis  # Chronic pain.  Stable. - Continue home pain medications and monitor for over-sedation.  - Senna and ducosate for anticipated constipation   # Depression/anxiety/insomnia. Stable. - Continue wellbutrin, valium, and ambien   # Dysuria and urinary frequency  - UA with equivocal findings, and UTI likely to be treated effectively with abx for PNA.   # Elevated creatinine. Improved. Seems to be near baseline, so suspect some CKD.  - No IVF as can tolerate po.   # Hypothyroidism:  - continue home dose of synthroid - TSH 0.153, dose modification per PCP  # History of MRSA infection  - Contact isolation  FEN/GI: SLIV; cardiac diet; K repleted PPx: SQ heparin  Disposition: Discharge home today with [facility] care management after transition to po  antibiotics.   Subjective: Pt reports some unchanged pain on deep inspiration and some shortness of breath. Denies cough or fever.   Objective: Temp:  [98.4 F (36.9 C)-98.6 F (37 C)] 98.4 F (36.9 C) (10/30 0525) Pulse Rate:  [84-100] 100 (10/30 0525) Resp:  [18] 18 (10/30 0525) BP: (95-96)/(55-57) 96/57 mmHg (10/30 0525) SpO2:  [94 %-96 %] 96 % (10/30 0525) Physical Exam: General: sleeping but rousable, NAD, cooperative Cardiovascular: RRR, no murmurs Respiratory: decreased air movement on right; no wheezes or rales Extremities: no edema, no calf tenderness  Laboratory:  Recent Labs Lab 08/19/13 2013 08/20/13 0155 08/21/13 0500  WBC 27.7* 29.2* 18.0*  HGB 10.9* 11.8* 10.5*  HCT 31.5* 33.9* 31.0*  PLT 149* 139* 149*    Recent Labs Lab 08/19/13 1500 08/19/13 2013 08/20/13 0155 08/21/13 0500  NA 139  --  136 137  K 3.5  --  3.1* 3.6  CL 100  --  97 99  CO2 26  --  26 27  BUN 34*  --  31* 27*  CREATININE 1.33* 1.24* 1.15* 0.98  CALCIUM 9.3  --  8.5 8.9  GLUCOSE 105*  --  110* 130*   Troponins neg x3  Imaging/Diagnostic Tests: CXR (10/28): Patchy infiltrate/ pneumonia in right upper lobe. Follow-up to  resolution after appropriate treatment is recommended.   Hazeline Junker, MD 08/21/2013, 10:52 AM PGY-1, [facility] Family Medicine FPTS Intern pager: [phone], text pages welcome

## 2013-08-21 NOTE — Progress Notes (Signed)
FMTS Attending Daily Note: Denny Levy MD [phone] pager office [phone] I  have seen and examined this patient, reviewed their chart. I have discussed this patient with the resident. I agree with the resident's findings, assessment and care plan. Would recommend follow up CXR in 6-8 weeks.

## 2013-08-21 NOTE — Progress Notes (Signed)
NURSING PROGRESS NOTE  [name] [id number] Discharge Data: 08/21/2013 12:48 PM Attending Provider: Nestor Ramp, MD WUJ:WJXB, Stan Head, MD     [name] to be D/C'd Home per MD order.    All IV's discontinued with no bleeding noted.  All belongings returned to patient for patient to take home.   Last Vital Signs:  Blood pressure 96/57, pulse 100, temperature 98.4 F (36.9 C), temperature source Oral, resp. rate 18, height 5\' 6"  (1.676 m), weight 69.174 kg (152 lb 8 oz), SpO2 96.00%.  Discharge Medication List   Medication List         albuterol 108 (90 BASE) MCG/ACT inhaler  Commonly known as:  PROVENTIL HFA;VENTOLIN HFA  Inhale 2 puffs into the lungs every 4 (four) hours as needed for wheezing (cough, shortness of breath or wheezing.).     aspirin 81 MG EC tablet  Take 1 tablet (81 mg total) by mouth daily.     buPROPion 150 MG 12 hr tablet  Commonly known as:  WELLBUTRIN SR  TAKE 1 TABLET BY MOUTH ONCE DAILY.     cyclobenzaprine 5 MG tablet  Commonly known as:  FLEXERIL  Take 5 mg by mouth 3 (three) times daily as needed. For spasms     diazepam 5 MG tablet  Commonly known as:  VALIUM  Take 1 tablet (5 mg total) by mouth every 8 (eight) hours as needed for anxiety. For anxiety     diclofenac sodium 1 % Gel  Commonly known as:  VOLTAREN  Apply 2 g topically 4 (four) times daily as needed (for pain).     folic acid 1 MG tablet  Commonly known as:  FOLVITE  Take 1 mg by mouth daily.     HYDROcodone-acetaminophen 10-325 MG per tablet  Commonly known as:  NORCO  Take 1 tablet by mouth every 6 (six) hours as needed. For pain     levofloxacin 750 MG tablet  Commonly known as:  LEVAQUIN  Take 1 tablet (750 mg total) by mouth daily.     levothyroxine 100 MCG tablet  Commonly known as:  SYNTHROID, LEVOTHROID  Take 1 tablet (100 mcg total) by mouth daily.     Methotrexate (PF) 25 MG/0.4ML Soaj  Inject 25 mg into the skin once a week.     metoprolol  tartrate 12.5 mg Tabs tablet  Commonly known as:  LOPRESSOR  Take 0.5 tablets (12.5 mg total) by mouth 2 (two) times daily.     morphine 30 MG 12 hr tablet  Commonly known as:  MS CONTIN  Take 30 mg by mouth 2 (two) times daily.     multivitamin with minerals Tabs tablet  Take 1 tablet by mouth daily.     rOPINIRole 4 MG tablet  Commonly known as:  REQUIP  Take 4 mg by mouth at bedtime.     triamterene-hydrochlorothiazide 75-50 MG per tablet  Commonly known as:  MAXZIDE  Take 1 tablet by mouth daily.     VITAMIN B-12 PO  Take 1 tablet by mouth daily.     VITAMIN C PO  Take 1 tablet by mouth daily.     zolpidem 5 MG tablet  Commonly known as:  AMBIEN  Take 5 mg by mouth at bedtime.        Madelin Rear, MSN, RN, [employer]

## 2013-08-21 NOTE — Discharge Summary (Signed)
Family Medicine Teaching [facility] Discharge Summary  Patient name: [name] Medical record number: [id number] Date of birth: 1951-09-06 Age: 62 y.o. Gender: female Date of Admission: 08/19/2013  Date of Discharge: 08/21/2013 Admitting Physician: Nestor Ramp, MD  Primary Care Provider: [name], MD Consultants: Cardiology  Indication for Hospitalization: Fever, shortness of breath and chest pain  Discharge Diagnoses/Problem List:  Community-acquired pneumonia Recurrent pericarditis Hypothyroidism History of partial nephrectomy History of ovarian cancer History of melanoma Chronic back pain Anxiety Fibromyalgia  Disposition: Discharged to home  Discharge Condition: Stable, improved  Discharge Exam:  VSS, afebrile General: sleeping but rousable, NAD, cooperative  Cardiovascular: RRR, no murmurs  Respiratory: decreased air movement on right; no wheezes or rales  Extremities: no edema, no calf tenderness  Brief Hospital Course:  [name] is a 62 y.o. female who presented on 10/28 with pleuritic chest pain, shortness of breath and fever. She has a history of recurrent pericarditis s/p pericardial window in 2009, ovarian CA s/p TAH-BSO, melanoma, partial nephrectomy, hypothyroidism, HTN, anxiety, insomnia, fibromyalgia and back pain.   A right upper lobe infiltrate was seen on chest x-ray, she had a leukocytosis of 27.7 and reported fever indicating community-acquired pneumonia as the source of her symptoms. Empiric treatment with rocephin and azithromycin was started. Contact isolation precautions were instated because of a history of MRSA colonization. The following day the leukocytosis increased and coverage was expanded to vancomycin and cefepime with good response the following day. Because of this delayed response to treatment and history of cancer (melanoma, ovarian) a CT of the chest with contrast was performed, which showed the RUL infiltrate without  evidence of obstruction. Follow up imaging was recommended. She did not complain of a cough and remained afebrile throughout admission (100.42F in the ED). Pleuritic chest pain continued but did not limit her functional status. She was discharged on a 14-day course of oral levaquin in improved condition on 08/21/2013.   Dr. Ella Jubilee, her outpatient cardiologist saw her and believed her symptoms did not have a cardiac etiology. Her ECG was stable from previous and troponins remained negative. There were no reported events on the cardiac monitor.   Ms. Gironda complained of urinary frequency and a urinalysis showed equivocal results (see below). No further action was taken as the antibiotics targeting pneumonia are effective against UTI-causing organisms. Urine culture was obtained and is pending at time of discharge. Creatinine was mildly elevated (1.24) on admission and improved without IV resuscitation to 0.98.   Medications treating chronic pain, depression, anxiety, insomnia, and hypothyroidism were continued.   Issues for Follow Up:  - Will need follow up CXR in 8 weeks.  - TSH was 0.153, synthroid dose adjustment was deferred to PCP - Hb A1c was 5.8%  Significant Procedures: None  Significant Labs and Imaging:   Recent Labs Lab 08/19/13 2013 08/20/13 0155 08/21/13 0500  WBC 27.7* 29.2* 18.0*  HGB 10.9* 11.8* 10.5*  HCT 31.5* 33.9* 31.0*  PLT 149* 139* 149*    Recent Labs Lab 08/19/13 1500 08/19/13 2013 08/20/13 0155 08/21/13 0500  NA 139  --  136 137  K 3.5  --  3.1* 3.6  CL 100  --  97 99  CO2 26  --  26 27  GLUCOSE 105*  --  110* 130*  BUN 34*  --  31* 27*  CREATININE 1.33* 1.24* 1.15* 0.98  CALCIUM 9.3  --  8.5 8.9  Troponins neg x3    08/21/2013 01:25  Color, Urine YELLOW  APPearance CLEAR  Specific Gravity, Urine 1.037 (H)  pH 5.0  Glucose NEGATIVE  Bilirubin Urine NEGATIVE  Ketones, ur 15 (A)  Protein NEGATIVE  Urobilinogen, UA 0.2  Nitrite NEGATIVE   Leukocytes, UA TRACE (A)  Hgb urine dipstick TRACE (A)  WBC, UA 3-6  Squamous Epithelial / LPF FEW (A)  Bacteria, UA RARE  Casts GRANULAR CAST (A)    CXR (10/28): Patchy infiltrate/ pneumonia in right upper lobe. Follow-up to  resolution after appropriate treatment is recommended.  CT CHEST WITH CONTRAST 10/29 FINDINGS:  THORACIC INLET/BODY WALL:  No acute abnormality.  MEDIASTINUM:  Normal heart size. No pericardial effusion. Diffuse atherosclerosis,  with notable noncalcified plaque involving the proximal left  subclavian artery, without high-grade stenosis. No acute vascular  abnormality. Mild right hilar lymphadenopathy, 13 mm short axis,  likely related to the ipsilateral pneumonia.  LUNG WINDOWS:  Dense consolidation in the posterior segment right upper lobe. There  are patchy ground-glass opacities in the bilateral lower lobes,  likely endobronchial spread of infection. No suspicious pulmonary  nodule. No effusion or cavitation.  UPPER ABDOMEN:  Prominent biliary tree, stable from prior.  OSSEOUS:  No acute osseous findings. Left glenohumeral total arthroplasty with  atrophy of the rotator cuff musculature.  IMPRESSION:  Right upper lobe pneumonia. No evidence of lung mass, but recommend  followup to radiologic clearing.  Results/Tests Pending at Time of Discharge: Urine Culture  Discharge Medications:    Medication List         albuterol 108 (90 BASE) MCG/ACT inhaler  Commonly known as:  PROVENTIL HFA;VENTOLIN HFA  Inhale 2 puffs into the lungs every 4 (four) hours as needed for wheezing (cough, shortness of breath or wheezing.).     aspirin 81 MG EC tablet  Take 1 tablet (81 mg total) by mouth daily.     buPROPion 150 MG 12 hr tablet  Commonly known as:  WELLBUTRIN SR  TAKE 1 TABLET BY MOUTH ONCE DAILY.     cyclobenzaprine 5 MG tablet  Commonly known as:  FLEXERIL  Take 5 mg by mouth 3 (three) times daily as needed. For spasms     diazepam 5 MG  tablet  Commonly known as:  VALIUM  Take 1 tablet (5 mg total) by mouth every 8 (eight) hours as needed for anxiety. For anxiety     diclofenac sodium 1 % Gel  Commonly known as:  VOLTAREN  Apply 2 g topically 4 (four) times daily as needed (for pain).     folic acid 1 MG tablet  Commonly known as:  FOLVITE  Take 1 mg by mouth daily.     HYDROcodone-acetaminophen 10-325 MG per tablet  Commonly known as:  NORCO  Take 1 tablet by mouth every 6 (six) hours as needed. For pain     levofloxacin 750 MG tablet  Commonly known as:  LEVAQUIN  Take 1 tablet (750 mg total) by mouth daily.     levothyroxine 100 MCG tablet  Commonly known as:  SYNTHROID, LEVOTHROID  Take 1 tablet (100 mcg total) by mouth daily.     Methotrexate (PF) 25 MG/0.4ML Soaj  Inject 25 mg into the skin once a week.     metoprolol tartrate 12.5 mg Tabs tablet  Commonly known as:  LOPRESSOR  Take 0.5 tablets (12.5 mg total) by mouth 2 (two) times daily.     morphine 30 MG 12 hr tablet  Commonly known as:  MS CONTIN  Take 30  mg by mouth 2 (two) times daily.     multivitamin with minerals Tabs tablet  Take 1 tablet by mouth daily.     rOPINIRole 4 MG tablet  Commonly known as:  REQUIP  Take 4 mg by mouth at bedtime.     triamterene-hydrochlorothiazide 75-50 MG per tablet  Commonly known as:  MAXZIDE  Take 1 tablet by mouth daily.     VITAMIN B-12 PO  Take 1 tablet by mouth daily.     VITAMIN C PO  Take 1 tablet by mouth daily.     zolpidem 5 MG tablet  Commonly known as:  AMBIEN  Take 5 mg by mouth at bedtime.        Discharge Instructions: Please refer to Patient Instructions section of EMR for full details.  Patient was counseled important signs and symptoms that should prompt return to medical care, changes in medications, dietary instructions, activity restrictions, and follow up appointments.   Follow-Up Appointments: Follow-up Information   Follow up with DAUB, STEVE A, MD In 1 week. (Call  for hospital follow up appointment)    Specialty:  Family Medicine   Contact information:   [address] [place] [address] [phone]       Hazeline Junker, MD 08/21/2013, 1:41 PM PGY-1, [facility]

## 2013-08-22 LAB — URINE CULTURE
Colony Count: NO GROWTH
Culture: NO GROWTH

## 2013-08-22 NOTE — Discharge Summary (Signed)
Family Medicine Teaching Service  Discharge Note : Attending Jan Olano MD Pager [phone] Office [phone] I have seen and examined this patient, reviewed their chart and discussed discharge planning wit the resident at the time of discharge. I agree with the discharge plan as above.  

## 2013-08-22 NOTE — Progress Notes (Signed)
FMTS Attending Daily Note: Angla Delahunt MD [phone] pager office [phone] I have discussed this patient with the resident and reviewed the assessment and plan as documented above. I agree wit the resident's findings and plan.  

## 2013-08-22 NOTE — H&P (Signed)
FMTS Attending Admission Note: Tina Nucci MD [phone] pager office [phone] I  have seen and examined this patient, reviewed their chart. I have discussed this patient with the resident. I agree with the resident's findings, assessment and care plan. 

## 2013-08-24 ENCOUNTER — Telehealth: Payer: Self-pay

## 2013-08-24 NOTE — Telephone Encounter (Signed)
Patient called stated she is patient of Dr Cleta Alberts & has just got out of hospital. Patient needs call back from doctor to discuss  Some issues. [phone]

## 2013-08-25 NOTE — Telephone Encounter (Signed)
Called patient. She was in hospital for pneumonia, was in hospital for 3 days. She is home now and was told to follow up in 1 week, wants to know best time to do this, Dr Cleta Alberts wants her to come in on Friday 7:45 - 5. Or Wednesday. Called her to advise.

## 2013-08-25 NOTE — Telephone Encounter (Signed)
lmom to cb. 

## 2013-08-27 ENCOUNTER — Ambulatory Visit (INDEPENDENT_AMBULATORY_CARE_PROVIDER_SITE_OTHER): Payer: Medicare Other | Admitting: Emergency Medicine

## 2013-08-27 ENCOUNTER — Ambulatory Visit: Payer: Medicare Other

## 2013-08-27 VITALS — BP 110/68 | HR 90 | Temp 98.0°F | Resp 16 | Ht 66.0 in | Wt 152.0 lb

## 2013-08-27 DIAGNOSIS — J189 Pneumonia, unspecified organism: Secondary | ICD-10-CM

## 2013-08-27 DIAGNOSIS — A4902 Methicillin resistant Staphylococcus aureus infection, unspecified site: Secondary | ICD-10-CM

## 2013-08-27 LAB — POCT CBC
Granulocyte percent: 62.9 %G (ref 37–80)
HCT, POC: 35.9 % — AB (ref 37.7–47.9)
Hemoglobin: 11 g/dL — AB (ref 12.2–16.2)
Lymph, poc: 2.1 (ref 0.6–3.4)
MCH, POC: 30.2 pg (ref 27–31.2)
MCHC: 30.6 g/dL — AB (ref 31.8–35.4)
MCV: 98.7 fL — AB (ref 80–97)
MID (cbc): 0.4 (ref 0–0.9)
MPV: 9.1 fL (ref 0–99.8)
POC Granulocyte: 4.2 (ref 2–6.9)
POC LYMPH PERCENT: 31.3 %L (ref 10–50)
POC MID %: 5.8 %M (ref 0–12)
Platelet Count, POC: 274 10*3/uL (ref 142–424)
RBC: 3.64 M/uL — AB (ref 4.04–5.48)
RDW, POC: 13.8 %
WBC: 6.7 10*3/uL (ref 4.6–10.2)

## 2013-08-27 LAB — POCT SEDIMENTATION RATE: POCT SED RATE: 102 mm/hr — AB (ref 0–22)

## 2013-08-27 NOTE — Progress Notes (Addendum)
Subjective:    Patient ID: [name], female    DOB: 1950/11/06, 62 y.o.   MRN: [id number]  HPI This chart was scribed for Viviann Spare Kyna Blahnik-MD, by Ladona Ridgel Day, Scribe. This patient was seen in room 1 and the patient's care was started at 12:20 PM.  HPI Comments: [name] is a 62 y.o. female who presents to the [facility] for f/u after she was recently in the hospital for pneumonia and questionable pericarditis which she states was ruled out.  Today she states here for follow up and brought with her test results from hospital admission, she was discharged 5 days ago and started on 14-day Levaquin medicine. While in the hospital she had a chest CT which was noted to have likely pneumonia and unremarkable findings of her heart. She states had been taking Methotrexate which she thought might have decreased her immunity. She also states had positive MRSA test at hospital.   Past Medical History  Diagnosis Date  . Chest pain at rest, rule out pericarditis 11/27/2012  . History of pericarditis, with pericardial window in 2009 11/27/2012  . Hypothyroidism 11/27/2012  . Venous insufficiency 11/27/2012  . Hx of melanoma of skin 11/27/2012  . Fibromyalgia   . CHF (congestive heart failure)   . Chronic kidney disease   . Anxiety   . Heart murmur   . Complication of anesthesia     "woke up twice during knee replacement" (08/19/2013)  . Pneumonia     "several times; including today", RUL/notes 08/19/2013  . SOB (shortness of breath)     "can happen at anytime; it's not all the time" (08/19/2013)  . Iron deficiency anemia   . Anemia   . Headache(784.0)     "felt like I've had one all day today; usually get headaches under stressful situations" (08/19/2013)  . Migraines     "maybe q 3-4 months" (08/19/2013)  . Arthritis     "everywhere" (08/19/2013)  . Chronic lower back pain   . Depression   . Hx of ovarian cancer 11/27/2012  . Kidney carcinoma     "both kidneys; ~ 3 yr apart"  (08/19/2013)  . Melanoma of back   . Squamous carcinoma     "face, side of my nose, chest; used acid to get rid of them" (08/19/2013)    Past Surgical History  Procedure Laterality Date  . Joint replacement    . Left oophorectomy Left 1979  . Lumbar disc surgery  1984    "ruptured disc"  . Fracture surgery    . Doppler echocardiography  02/23/2011    LVEF>50%  . Partial nephrectomy Right 2005; ~ 2008  . Pericardial window  ~ 2012  . Tonsillectomy  1954  . Appendectomy    . Carpal tunnel release Right ~ 1992  . Tubal ligation  1952  . Reduction mammaplasty Bilateral ~ 2008  . Shoulder arthroscopy w/ rotator cuff repair Right 1990's  . Total shoulder replacement Left 2006?  Marland Kitchen Total knee arthroplasty Right 1990's  . Knee arthroscopy Right     "twice before the replacement" (08/19/2013)  . Abdominal hysterectomy  1979  . Right oophorectomy  ~ 2007    "it had cancer in it" (08/19/2013)  . Posterior lumbar fusion  2002?; 03/2012  . Melanoma excision  ~ 2010    "my lower back" (08/19/2013)    Family History  Problem Relation Age of Onset  . Cancer Mother   . Cancer Father   .  Bipolar disorder Sister     History   Social History  . Marital Status: Married    Spouse Name: N/A    Number of Children: N/A  . Years of Education: N/A   Occupational History  . Not on file.   Social History Main Topics  . Smoking status: Former Smoker -- 1.50 packs/day for 18 years    Types: Cigarettes    Quit date: 09/22/1985  . Smokeless tobacco: Never Used  . Alcohol Use: Yes     Comment: 08/19/2013 "drank occasionally in my 20's"  . Drug Use: No     Comment: prescribed  . Sexual Activity: No   Other Topics Concern  . Not on file   Social History Narrative   Married.    Allergies  Allergen Reactions  . Ampicillin Rash    Patient Active Problem List   Diagnosis Date Noted  . Pneumonia, organism unspecified 08/19/2013  . Anxiety 11/28/2012  . Seizure disorder 11/28/2012   . Chest pain at rest, rule out pericarditis 11/27/2012  . History of pericarditis, with pericardial window in 2009, and recurrent episodes 2011,2013, treated with methotrexate 11/27/2012  . SOB (shortness of breath) 11/27/2012  . Hypothyroidism 11/27/2012  . History of partial nephrectomy, both rt. and lt 11/27/2012  . Hx of ovarian cancer 11/27/2012  . Venous insufficiency 11/27/2012  . Chronic back pain, hx of lumbar spondylosis and stenosis L5-S1 with hx decompression and total diskectomy 11/27/2012  . Hx of melanoma of skin 11/27/2012  . 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 DIARRHEA-PRESUMED INFECTIOUS 12/27/2009    Results for orders placed during the hospital encounter of 08/19/13  MRSA PCR SCREENING      Result Value Range   MRSA by PCR POSITIVE (*) NEGATIVE  URINE CULTURE      Result Value Range   Specimen Description URINE, RANDOM     Special Requests NONE     Culture  Setup Time       Value: 08/21/2013 09:17     Performed at [facility] Count       Value: NO GROWTH     Performed at [facility]   Culture       Value: NO GROWTH     Performed at [facility]   Report Status 08/22/2013 FINAL    CBC      Result Value Range   WBC 15.1 (*) 4.0 - 10.5 K/uL   RBC 3.99  3.87 - 5.11 MIL/uL   Hemoglobin 12.7  12.0 - 15.0 g/dL   HCT 16.1  09.6 - 04.5 %   MCV 93.0  78.0 - 100.0 fL   MCH 31.8  26.0 - 34.0 pg   MCHC 34.2  30.0 - 36.0 g/dL   RDW 40.9  81.1 - 91.4 %   Platelets 145 (*) 150 - 400 K/uL  BASIC METABOLIC PANEL      Result Value Range   Sodium 139  135 - 145 mEq/L   Potassium 3.5  3.5 - 5.1 mEq/L   Chloride 100  96 - 112 mEq/L   CO2 26  19 - 32 mEq/L   Glucose, Bld 105 (*) 70 - 99 mg/dL   BUN 34 (*) 6 - 23 mg/dL   Creatinine, Ser 7.82 (*) 0.50 - 1.10 mg/dL   Calcium 9.3  8.4 - 95.6 mg/dL   GFR calc non Af Amer 42 (*) >90 mL/min   GFR calc Af Denyse Dago  48 (*) >90 mL/min  PRO B NATRIURETIC PEPTIDE      Result Value  Range   Pro B Natriuretic peptide (BNP) 296.8 (*) 0 - 125 pg/mL  CBC      Result Value Range   WBC 27.7 (*) 4.0 - 10.5 K/uL   RBC 3.36 (*) 3.87 - 5.11 MIL/uL   Hemoglobin 10.9 (*) 12.0 - 15.0 g/dL   HCT 16.1 (*) 09.6 - 04.5 %   MCV 93.8  78.0 - 100.0 fL   MCH 32.4  26.0 - 34.0 pg   MCHC 34.6  30.0 - 36.0 g/dL   RDW 40.9  81.1 - 91.4 %   Platelets 149 (*) 150 - 400 K/uL  CREATININE, SERUM      Result Value Range   Creatinine, Ser 1.24 (*) 0.50 - 1.10 mg/dL   GFR calc non Af Amer 45 (*) >90 mL/min   GFR calc Af Amer 52 (*) >90 mL/min  CBC      Result Value Range   WBC 29.2 (*) 4.0 - 10.5 K/uL   RBC 3.63 (*) 3.87 - 5.11 MIL/uL   Hemoglobin 11.8 (*) 12.0 - 15.0 g/dL   HCT 78.2 (*) 95.6 - 21.3 %   MCV 93.4  78.0 - 100.0 fL   MCH 32.5  26.0 - 34.0 pg   MCHC 34.8  30.0 - 36.0 g/dL   RDW 08.6  57.8 - 46.9 %   Platelets 139 (*) 150 - 400 K/uL  BASIC METABOLIC PANEL      Result Value Range   Sodium 136  135 - 145 mEq/L   Potassium 3.1 (*) 3.5 - 5.1 mEq/L   Chloride 97  96 - 112 mEq/L   CO2 26  19 - 32 mEq/L   Glucose, Bld 110 (*) 70 - 99 mg/dL   BUN 31 (*) 6 - 23 mg/dL   Creatinine, Ser 6.29 (*) 0.50 - 1.10 mg/dL   Calcium 8.5  8.4 - 52.8 mg/dL   GFR calc non Af Amer 50 (*) >90 mL/min   GFR calc Af Amer 57 (*) >90 mL/min  TSH      Result Value Range   TSH 0.153 (*) 0.350 - 4.500 uIU/mL  TROPONIN I      Result Value Range   Troponin I <0.30  <0.30 ng/mL  TROPONIN I      Result Value Range   Troponin I <0.30  <0.30 ng/mL  TROPONIN I      Result Value Range   Troponin I <0.30  <0.30 ng/mL  HEMOGLOBIN A1C      Result Value Range   Hemoglobin A1C 5.8 (*) <5.7 %   Mean Plasma Glucose 120 (*) <117 mg/dL  GLUCOSE, CAPILLARY      Result Value Range   Glucose-Capillary 129 (*) 70 - 99 mg/dL   Comment 1 Notify RN     Comment 2 Documented in Chart    URINALYSIS, ROUTINE W REFLEX MICROSCOPIC      Result Value Range   Color, Urine YELLOW  YELLOW   APPearance CLEAR  CLEAR    Specific Gravity, Urine 1.037 (*) 1.005 - 1.030   pH 5.0  5.0 - 8.0   Glucose, UA NEGATIVE  NEGATIVE mg/dL   Hgb urine dipstick TRACE (*) NEGATIVE   Bilirubin Urine NEGATIVE  NEGATIVE   Ketones, ur 15 (*) NEGATIVE mg/dL   Protein, ur NEGATIVE  NEGATIVE mg/dL   Urobilinogen, UA 0.2  0.0 - 1.0 mg/dL  Nitrite NEGATIVE  NEGATIVE   Leukocytes, UA TRACE (*) NEGATIVE  LACTIC ACID, PLASMA      Result Value Range   Lactic Acid, Venous 0.8  0.5 - 2.2 mmol/L  GLUCOSE, CAPILLARY      Result Value Range   Glucose-Capillary 106 (*) 70 - 99 mg/dL  BASIC METABOLIC PANEL      Result Value Range   Sodium 137  135 - 145 mEq/L   Potassium 3.6  3.5 - 5.1 mEq/L   Chloride 99  96 - 112 mEq/L   CO2 27  19 - 32 mEq/L   Glucose, Bld 130 (*) 70 - 99 mg/dL   BUN 27 (*) 6 - 23 mg/dL   Creatinine, Ser 1.61  0.50 - 1.10 mg/dL   Calcium 8.9  8.4 - 09.6 mg/dL   GFR calc non Af Amer 60 (*) >90 mL/min   GFR calc Af Amer 70 (*) >90 mL/min  CBC      Result Value Range   WBC 18.0 (*) 4.0 - 10.5 K/uL   RBC 3.32 (*) 3.87 - 5.11 MIL/uL   Hemoglobin 10.5 (*) 12.0 - 15.0 g/dL   HCT 04.5 (*) 40.9 - 81.1 %   MCV 93.4  78.0 - 100.0 fL   MCH 31.6  26.0 - 34.0 pg   MCHC 33.9  30.0 - 36.0 g/dL   RDW 91.4  78.2 - 95.6 %   Platelets 149 (*) 150 - 400 K/uL  URINE MICROSCOPIC-ADD ON      Result Value Range   Squamous Epithelial / LPF FEW (*) RARE   WBC, UA 3-6  <3 WBC/hpf   Bacteria, UA RARE  RARE   Casts GRANULAR CAST (*) NEGATIVE  POCT I-STAT TROPONIN I      Result Value Range   Troponin i, poc 0.00  0.00 - 0.08 ng/mL   Comment 3             No diagnosis found.  No orders of the defined types were placed in this encounter.    Review of Systems Triage Vitals: BP 110/68  Pulse 90  Temp(Src) 98 F (36.7 C) (Oral)  Resp 16  Ht 5\' 6"  (1.676 m)  Wt 152 lb (68.947 kg)  BMI 24.55 kg/m2  SpO2 98%    Objective:   Physical Exam  Nursing note and vitals reviewed. Constitutional: She is oriented to person,  place, and time. She appears well-developed and well-nourished. No distress.  HENT:  Head: Normocephalic and atraumatic.  Neck: Neck supple. No tracheal deviation present.  Cardiovascular: Normal rate.   Pulmonary/Chest: Effort normal. No respiratory distress.  Musculoskeletal: Normal range of motion.  Neurological: She is alert and oriented to person, place, and time.  Skin: Skin is warm and dry.  Psychiatric: She has a normal mood and affect. Her behavior is normal.   Results for orders placed in visit on 08/27/13  POCT CBC      Result Value Range   WBC 6.7  4.6 - 10.2 K/uL   Lymph, poc 2.1  0.6 - 3.4   POC LYMPH PERCENT 31.3  10 - 50 %L   MID (cbc) 0.4  0 - 0.9   POC MID % 5.8  0 - 12 %M   POC Granulocyte 4.2  2 - 6.9   Granulocyte percent 62.9  37 - 80 %G   RBC 3.64 (*) 4.04 - 5.48 M/uL   Hemoglobin 11.0 (*) 12.2 - 16.2 g/dL   HCT, POC 21.3 (*)  37.7 - 47.9 %   MCV 98.7 (*) 80 - 97 fL   MCH, POC 30.2  27 - 31.2 pg   MCHC 30.6 (*) 31.8 - 35.4 g/dL   RDW, POC 16.1     Platelet Count, POC 274  142 - 424 K/uL   MPV 9.1  0 - 99.8 fL   [facility] reading (PRIMARY) by  Dr. Cleta Alberts  approximately 50% resolution of the right upper lobe pneumonia        Assessment & Plan:  White count is better chest x-ray improved she will finish out her antibiotics recheck in one week.

## 2013-08-28 ENCOUNTER — Other Ambulatory Visit: Payer: Self-pay

## 2013-08-28 ENCOUNTER — Encounter: Payer: Self-pay | Admitting: Emergency Medicine

## 2013-08-29 ENCOUNTER — Telehealth: Payer: Self-pay

## 2013-08-29 NOTE — Telephone Encounter (Signed)
It is in process. Called to advise.

## 2013-08-29 NOTE — Telephone Encounter (Signed)
Pt would like to know if her wound culture results was back yet. Best# [phone]

## 2013-08-30 LAB — MRSA CULTURE: Organism ID, Bacteria: NO GROWTH

## 2013-09-10 ENCOUNTER — Other Ambulatory Visit: Payer: Self-pay | Admitting: Emergency Medicine

## 2013-09-10 DIAGNOSIS — F4321 Adjustment disorder with depressed mood: Secondary | ICD-10-CM

## 2013-09-11 ENCOUNTER — Other Ambulatory Visit: Payer: Self-pay | Admitting: Radiology

## 2013-09-23 ENCOUNTER — Ambulatory Visit (INDEPENDENT_AMBULATORY_CARE_PROVIDER_SITE_OTHER): Payer: Medicare Other | Admitting: Emergency Medicine

## 2013-09-23 ENCOUNTER — Encounter: Payer: Self-pay | Admitting: Emergency Medicine

## 2013-09-23 ENCOUNTER — Telehealth: Payer: Self-pay | Admitting: *Deleted

## 2013-09-23 VITALS — BP 138/73 | HR 85 | Temp 97.7°F | Resp 16 | Ht 65.75 in | Wt 148.0 lb

## 2013-09-23 DIAGNOSIS — F411 Generalized anxiety disorder: Secondary | ICD-10-CM

## 2013-09-23 DIAGNOSIS — H539 Unspecified visual disturbance: Secondary | ICD-10-CM

## 2013-09-23 DIAGNOSIS — R634 Abnormal weight loss: Secondary | ICD-10-CM

## 2013-09-23 DIAGNOSIS — C649 Malignant neoplasm of unspecified kidney, except renal pelvis: Secondary | ICD-10-CM

## 2013-09-23 DIAGNOSIS — E039 Hypothyroidism, unspecified: Secondary | ICD-10-CM

## 2013-09-23 DIAGNOSIS — Z23 Encounter for immunization: Secondary | ICD-10-CM

## 2013-09-23 DIAGNOSIS — C439 Malignant melanoma of skin, unspecified: Secondary | ICD-10-CM

## 2013-09-23 DIAGNOSIS — R700 Elevated erythrocyte sedimentation rate: Secondary | ICD-10-CM

## 2013-09-23 DIAGNOSIS — I309 Acute pericarditis, unspecified: Secondary | ICD-10-CM

## 2013-09-23 LAB — CBC WITH DIFFERENTIAL/PLATELET
Basophils Absolute: 0 10*3/uL (ref 0.0–0.1)
Basophils Relative: 1 % (ref 0–1)
Eosinophils Absolute: 0.2 10*3/uL (ref 0.0–0.7)
Eosinophils Relative: 3 % (ref 0–5)
HCT: 34.1 % — ABNORMAL LOW (ref 36.0–46.0)
Hemoglobin: 11.5 g/dL — ABNORMAL LOW (ref 12.0–15.0)
Lymphocytes Relative: 49 % — ABNORMAL HIGH (ref 12–46)
Lymphs Abs: 2.7 10*3/uL (ref 0.7–4.0)
MCH: 30.6 pg (ref 26.0–34.0)
MCHC: 33.7 g/dL (ref 30.0–36.0)
MCV: 90.7 fL (ref 78.0–100.0)
Monocytes Absolute: 0.4 10*3/uL (ref 0.1–1.0)
Monocytes Relative: 8 % (ref 3–12)
Neutro Abs: 2.1 10*3/uL (ref 1.7–7.7)
Neutrophils Relative %: 39 % — ABNORMAL LOW (ref 43–77)
Platelets: 163 10*3/uL (ref 150–400)
RBC: 3.76 MIL/uL — ABNORMAL LOW (ref 3.87–5.11)
RDW: 14.7 % (ref 11.5–15.5)
WBC: 5.5 10*3/uL (ref 4.0–10.5)

## 2013-09-23 LAB — SEDIMENTATION RATE: Sed Rate: 11 mm/hr (ref 0–22)

## 2013-09-23 MED ORDER — LEVOTHYROXINE SODIUM 88 MCG PO TABS: 88.0000 ug | ORAL_TABLET | Freq: Every day | ORAL | Status: DC

## 2013-09-23 MED ORDER — BUPROPION HCL ER (SR) 150 MG PO TB12: ORAL_TABLET | ORAL | Status: DC

## 2013-09-23 MED ORDER — DIAZEPAM 5 MG PO TABS: 5.0000 mg | ORAL_TABLET | Freq: Three times a day (TID) | ORAL | Status: DC | PRN

## 2013-09-23 MED ORDER — LEVOTHYROXINE SODIUM 100 MCG PO TABS: 100.0000 ug | ORAL_TABLET | Freq: Every day | ORAL | Status: DC

## 2013-09-23 NOTE — Progress Notes (Signed)
   Subjective:    Patient ID: [name], female    DOB: 1951/05/15, 62 y.o.   MRN: [id number]  HPI patient in for followup. She has been felt to have some type of inflammatory process with elevated sedimentation rate which caused pericarditis. This has never been clearly defined. She does have a cardiologist and is due to get a second opinion from Dr. Jacinto Halim. 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 She also has a history of melanoma. She is concerned about weight loss. She was recently hospitalized with a pneumonia which on last chest x-ray showed evidence of clearing. CT did not show any evidence of mass formation. She is complaining of visual discomfort scotomata involving the left eye but no headache.    Review of Systems     Objective:   Physical Exam HEENT exam is unremarkable. Neck is supple. Her chest is clear. Breast exam reveals evidence of a previous breast reduction. There no masses felt. Her cardiac exam reveals no rub or murmur. Her abdomen is soft nontender. Her extremities are without edema neurological exam is intact. Cranial nerves intact disc margins are sharp        Assessment & Plan:  Her last TSH is low she may be hyperthyroid secondary to too high dose of Synthroid. I did decrease this from 100 mcg to 88 mcg. We'll schedule CT of the head because of her history of melanoma and recent scotomata. She could have temporal arteritis that appointment has been made with Dr. Alden Hipp for his evaluation. I also schedule a noncontrast CT of the abdomen. She has a history of renal cancer. Her last BUN was elevated and I did not want to give her IV contrast. Shot was given she was given a pneumonia vaccine because of her recent episode of pneumonia in the hospital. From our records she has had an 11 pound weight loss in the last year.

## 2013-09-23 NOTE — Telephone Encounter (Signed)
Called [facility] to let them know medication Synthroid has been changed to 88 mcg, instead of 100 mcg, per Dr Cleta Alberts. Patient was advised in the office at her appointment.

## 2013-09-24 LAB — ANCA SCREEN W REFLEX TITER
Atypical p-ANCA Screen: NEGATIVE
c-ANCA Screen: NEGATIVE
p-ANCA Screen: NEGATIVE

## 2013-09-24 LAB — T4, FREE: Free T4: 1.32 ng/dL (ref 0.80–1.80)

## 2013-09-24 LAB — TSH: TSH: 0.785 u[IU]/mL (ref 0.350–4.500)

## 2013-09-27 ENCOUNTER — Encounter: Payer: Self-pay | Admitting: Emergency Medicine

## 2013-10-06 ENCOUNTER — Ambulatory Visit
Admission: RE | Admit: 2013-10-06 | Discharge: 2013-10-06 | Disposition: A | Discharge status: Home / Self Care | Payer: Medicare Other | Source: Ambulatory Visit | Attending: Emergency Medicine | Admitting: Emergency Medicine

## 2013-10-06 DIAGNOSIS — H539 Unspecified visual disturbance: Secondary | ICD-10-CM

## 2013-10-06 DIAGNOSIS — C649 Malignant neoplasm of unspecified kidney, except renal pelvis: Secondary | ICD-10-CM

## 2013-10-06 DIAGNOSIS — C439 Malignant melanoma of skin, unspecified: Secondary | ICD-10-CM

## 2013-10-06 DIAGNOSIS — R700 Elevated erythrocyte sedimentation rate: Secondary | ICD-10-CM

## 2013-10-06 DIAGNOSIS — R634 Abnormal weight loss: Secondary | ICD-10-CM

## 2013-10-09 ENCOUNTER — Telehealth: Payer: Self-pay

## 2013-10-09 ENCOUNTER — Other Ambulatory Visit: Payer: Self-pay | Admitting: Physician Assistant

## 2013-10-09 NOTE — Telephone Encounter (Signed)
Pt called regarding recent scan results on her stomach/abdomen  Please call pt.  bf

## 2013-10-10 ENCOUNTER — Encounter: Payer: Self-pay | Admitting: Emergency Medicine

## 2013-10-10 NOTE — Telephone Encounter (Signed)
Found it, in the CT abdomen radiology report indicates Centrilobular emphysema. There is not a mention of this in any of her chest xrays, patient very concerned over this, please advise.

## 2013-10-10 NOTE — Telephone Encounter (Signed)
Called her yesterday, she has questions I can not help her with she states xray findings show emphysema, but I do not see this in any of the reports.

## 2013-10-10 NOTE — Telephone Encounter (Signed)
Reply sent

## 2013-10-10 NOTE — Telephone Encounter (Signed)
Emphysema just means there changes in the lung tissue. Her chest x-ray reading is normal. This does not change her treatment. In the next time she comes to the office we can do some pulmonary function tests. The main thing with emphysema is to be sure that patients do not smoke.

## 2013-10-11 ENCOUNTER — Encounter: Payer: Self-pay | Admitting: Emergency Medicine

## 2013-10-13 NOTE — Telephone Encounter (Signed)
I called to advise, she quit smoking 28 yrs ago.

## 2013-10-29 ENCOUNTER — Other Ambulatory Visit: Payer: Self-pay | Admitting: Obstetrics and Gynecology

## 2013-11-06 ENCOUNTER — Other Ambulatory Visit: Payer: Self-pay | Admitting: Emergency Medicine

## 2013-11-06 NOTE — Telephone Encounter (Signed)
Called in.

## 2013-11-20 ENCOUNTER — Encounter: Payer: Self-pay | Admitting: Emergency Medicine

## 2013-11-27 ENCOUNTER — Other Ambulatory Visit: Payer: Self-pay | Admitting: Physician Assistant

## 2013-12-30 ENCOUNTER — Ambulatory Visit (INDEPENDENT_AMBULATORY_CARE_PROVIDER_SITE_OTHER): Payer: Medicare Other | Admitting: Emergency Medicine

## 2013-12-30 ENCOUNTER — Encounter: Payer: Self-pay | Admitting: Emergency Medicine

## 2013-12-30 VITALS — BP 132/79 | HR 94 | Temp 98.2°F | Resp 16 | Ht 65.5 in | Wt 155.4 lb

## 2013-12-30 DIAGNOSIS — E039 Hypothyroidism, unspecified: Secondary | ICD-10-CM

## 2013-12-30 DIAGNOSIS — Z23 Encounter for immunization: Secondary | ICD-10-CM

## 2013-12-30 DIAGNOSIS — R634 Abnormal weight loss: Secondary | ICD-10-CM

## 2013-12-30 DIAGNOSIS — G8929 Other chronic pain: Secondary | ICD-10-CM

## 2013-12-30 MED ORDER — LEVOTHYROXINE SODIUM 100 MCG PO TABS: 100.0000 ug | ORAL_TABLET | Freq: Every day | ORAL | Status: DC

## 2013-12-30 NOTE — Addendum Note (Signed)
Addended by: Alfredia Ferguson A on: 12/30/2013 01:52 PM   Modules accepted: Orders

## 2013-12-30 NOTE — Patient Instructions (Signed)
Tetanus, Diphtheria (Td) Vaccine What You Need to Know WHY GET VACCINATED? Tetanus  and diphtheria are very serious diseases. They are rare in the [place] today, but people who do become infected often have severe complications. Td vaccine is used to protect adolescents and adults from both of these diseases. Both tetanus and diphtheria are infections caused by bacteria. Diphtheria spreads from person to person through coughing or sneezing. Tetanus-causing bacteria enter the body through cuts, scratches, or wounds. TETANUS (Lockjaw) causes painful muscle tightening and stiffness, usually all over the body.  It can lead to tightening of muscles in the head and neck so you can't open your mouth, swallow, or sometimes even breathe. Tetanus kills about 1 out of every 5 people who are infected. DIPHTHERIA can cause a thick coating to form in the back of the throat.  It can lead to breathing problems, paralysis, heart failure, and death. Before vaccines, the [place] saw as many as 200,000 cases a year of diphtheria and hundreds of cases of tetanus. Since vaccination began, cases of both diseases have dropped by about 99%. TD VACCINE Td vaccine can protect adolescents and adults from tetanus and diphtheria. Td is usually given as a booster dose every 10 years but it can also be given earlier after a severe and dirty wound or burn. Your doctor can give you more information. Td may safely be given at the same time as other vaccines. SOME PEOPLE SHOULD NOT GET THIS VACCINE  If you ever had a life-threatening allergic reaction after a dose of any tetanus or diphtheria containing vaccine, OR if you have a severe allergy to any part of this vaccine, you should not get Td. Tell your doctor if you have any severe allergies.  Talk to your doctor if you:  have epilepsy or another nervous system problem,  had severe pain or swelling after any vaccine containing diphtheria or tetanus,  ever had  Guillain Barr Syndrome (GBS),  aren't feeling well on the day the shot is scheduled. RISKS OF A VACCINE REACTION With a vaccine, like any medicine, there is a chance of side effects. These are usually mild and go away on their own. Serious side effects are also possible, but are very rare. Most people who get Td vaccine do not have any problems with it. Mild Problems  following Td (Did not interfere with activities)  Pain where the shot was given (about 8 people in 10)  Redness or swelling where the shot was given (about 1 person in 3)  Mild fever (about 1 person in 15)  Headache or Tiredness (uncommon) Moderate Problems following Td (Interfered with activities, but did not require medical attention)  Fever over 102 F (38.9 C) (rare) Severe Problems  following Td (Unable to perform usual activities; required medical attention)  Swelling, severe pain, bleeding, or redness in the arm where the shot was given (rare). Problems that could happen after any vaccine:  Brief fainting spells can happen after any medical procedure, including vaccination. Sitting or lying down for about 15 minutes can help prevent fainting, and injuries caused by a fall. Tell your doctor if you feel dizzy, or have vision changes or ringing in the ears.  Severe shoulder pain and reduced range of motion in the arm where a shot was given can happen, very rarely, after a vaccination.  Severe allergic reactions from a vaccine are very rare, estimated at less than 1 in a million doses. If one were to occur, it would  usually be within a few minutes to a few hours after the vaccination. WHAT IF THERE IS A SERIOUS REACTION? What should I look for?  Look for anything that concerns you, such as signs of a severe allergic reaction, very high fever, or behavior changes. Signs of a severe allergic reaction can include hives, swelling of the face and throat, difficulty breathing, a fast heartbeat, dizziness, and  weakness. These would usually start a few minutes to a few hours after the vaccination. What should I do?  If you think it is a severe allergic reaction or other emergency that can't wait, call 911 or get the person to the nearest hospital. Otherwise, call your doctor.  Afterward, the reaction should be reported to the Vaccine Adverse Event Reporting System (VAERS). Your doctor might file this report, or, you can do it yourself through the VAERS website or by calling [phone]. VAERS is only for reporting reactions. They do not give medical advice. THE NATIONAL VACCINE INJURY COMPENSATION PROGRAM The National Vaccine Injury Compensation Program (VICP) is a federal program that was created to compensate people who may have been injured by certain vaccines. Persons who believe they may have been injured by a vaccine can learn about the program and about filing a claim by calling 2675967339 or visiting the [facility] website. HOW CAN I LEARN MORE?  Ask your doctor.  Contact your local or state health department.  Contact the Centers for Disease Control and Prevention (CDC):  Call 778-681-9449 (1-800-CDC-INFO)  Visit CDC's vaccines website CDC Td Vaccine Interim VIS (11/26/12) Document Released: 08/06/2006 Document Revised: 02/03/2013 Document Reviewed: 01/29/2013 [facility] Patient Information 2014 [place], [place].

## 2013-12-30 NOTE — Progress Notes (Addendum)
Subjective:  This chart was scribed for Tina Russian, MD by Mercy Moore, Medial Scribe. This patient was seen in room 22 and the patient's care was started at 1:25 PM.   Patient ID: [name], female    DOB: 04/17/1951, 63 y.o.   MRN: [id number]  HPI [name] is a 63 y.o. female here for a follow up. Patient is requesting an increase in her Synthroid dosage, because she felt better on the higher dosage. Patient reports that she feels sluggish and lazy on the her current, lowered dosage. Patient reports weight gain and expresses satisfaction with her current weight. Patient shares that she was in the hospital recently due to a bout with pneumonia. Patient vocalizes concern for the emphysema in her right lung. Patient has 28 year smoking history, but quit years ago.    Patient expresses need for an updated need for a tetanus shot.   Patient Active Problem List   Diagnosis Date Noted  . Pneumonia, organism unspecified 08/19/2013  . Anxiety 11/28/2012  . Seizure disorder 11/28/2012  . Chest pain at rest, rule out pericarditis 11/27/2012  . History of pericarditis, with pericardial window in 2009, and recurrent episodes 2011,2013, treated with methotrexate 11/27/2012  . SOB (shortness of breath) 11/27/2012  . Hypothyroidism 11/27/2012  . History of partial nephrectomy, both rt. and lt 11/27/2012  . Hx of ovarian cancer 11/27/2012  . Venous insufficiency 11/27/2012  . Chronic back pain, hx of lumbar spondylosis and stenosis L5-S1 with hx decompression and total diskectomy 11/27/2012  . Hx of melanoma of skin 11/27/2012  . 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 DIARRHEA-PRESUMED INFECTIOUS 12/27/2009   Past Medical History  Diagnosis Date  . Chest pain at rest, rule out pericarditis 11/27/2012  . History of pericarditis, with pericardial window in 2009 11/27/2012  . Hypothyroidism 11/27/2012  . Venous insufficiency 11/27/2012  . Hx of melanoma of skin 11/27/2012  . Fibromyalgia    . CHF (congestive heart failure)   . Chronic kidney disease   . Anxiety   . Heart murmur   . Complication of anesthesia     "woke up twice during knee replacement" (08/19/2013)  . Pneumonia     "several times; including today", RUL/notes 08/19/2013  . SOB (shortness of breath)     "can happen at anytime; it's not all the time" (08/19/2013)  . Iron deficiency anemia   . Anemia   . Headache(784.0)     "felt like I've had one all day today; usually get headaches under stressful situations" (08/19/2013)  . Migraines     "maybe q 3-4 months" (08/19/2013)  . Arthritis     "everywhere" (08/19/2013)  . Chronic lower back pain   . Depression   . Hx of ovarian cancer 11/27/2012  . Kidney carcinoma     "both kidneys; ~ 3 yr apart" (08/19/2013)  . Melanoma of back   . Squamous carcinoma     "face, side of my nose, chest; used acid to get rid of them" (08/19/2013)   Past Surgical History  Procedure Laterality Date  . Joint replacement    . Left oophorectomy Left 1979  . Lumbar disc surgery  1984    "ruptured disc"  . Fracture surgery    . Doppler echocardiography  02/23/2011    LVEF>50%  . Partial nephrectomy Right 2005; ~ 2008  . Pericardial window  ~ 2012  . Tonsillectomy  1954  . Appendectomy    . Carpal  tunnel release Right ~ 1992  . Tubal ligation  1952  . Reduction mammaplasty Bilateral ~ 2008  . Shoulder arthroscopy w/ rotator cuff repair Right 1990's  . Total shoulder replacement Left 2006?  Marland Kitchen Total knee arthroplasty Right 1990's  . Knee arthroscopy Right     "twice before the replacement" (08/19/2013)  . Abdominal hysterectomy  1979  . Right oophorectomy  ~ 2007    "it had cancer in it" (08/19/2013)  . Posterior lumbar fusion  2002?; 03/2012  . Melanoma excision  ~ 2010    "my lower back" (08/19/2013)   Allergies  Allergen Reactions  . Ampicillin Rash   Prior to Admission medications   Medication Sig Start Date End Date Taking? Authorizing Provider  albuterol  (PROVENTIL HFA;VENTOLIN HFA) 108 (90 BASE) MCG/ACT inhaler Inhale 2 puffs into the lungs every 4 (four) hours as needed for wheezing (cough, shortness of breath or wheezing.). 12/31/12   Tina Russian, MD  Ascorbic Acid (VITAMIN C PO) Take 1 tablet by mouth daily.    Historical Provider, MD  aspirin EC 81 MG EC tablet Take 1 tablet (81 mg total) by mouth daily. 11/28/12   Erlene Quan, PA-C  buPROPion (WELLBUTRIN SR) 150 MG 12 hr tablet TAKE 1 TABLET BY MOUTH ONCE DAILY. 09/23/13   Tina Russian, MD  Cyanocobalamin (VITAMIN B-12 PO) Take 1 tablet by mouth daily.    Historical Provider, MD  cyclobenzaprine (FLEXERIL) 5 MG tablet Take 5 mg by mouth 3 (three) times daily as needed. For spasms 08/23/12   Historical Provider, MD  diazepam (VALIUM) 5 MG tablet Take 1 tablet (5 mg total) by mouth every 8 (eight) hours as needed for anxiety. For anxiety 09/23/13   Tina Russian, MD  diclofenac sodium (VOLTAREN) 1 % GEL Apply 2 g topically 4 (four) times daily as needed (for pain).    Historical Provider, MD  folic acid (FOLVITE) 1 MG tablet Take 1 mg by mouth daily.    Historical Provider, MD  HYDROcodone-acetaminophen (NORCO) 10-325 MG per tablet Take 1 tablet by mouth every 6 (six) hours as needed. For pain    Historical Provider, MD  levothyroxine (SYNTHROID, LEVOTHROID) 88 MCG tablet Take 1 tablet (88 mcg total) by mouth daily. 09/23/13   Tina Russian, MD  Methotrexate, PF, 25 MG/0.4ML SOAJ Inject 25 mg into the skin once a week.    Historical Provider, MD  metoprolol tartrate (LOPRESSOR) 12.5 mg TABS Take 0.5 tablets (12.5 mg total) by mouth 2 (two) times daily. 11/28/12   Erlene Quan, PA-C  morphine (MS CONTIN) 30 MG 12 hr tablet Take 30 mg by mouth 2 (two) times daily.    Historical Provider, MD  Multiple Vitamin (MULTIVITAMIN WITH MINERALS) TABS Take 1 tablet by mouth daily.    Historical Provider, MD  rOPINIRole (REQUIP) 4 MG tablet Take 4 mg by mouth at bedtime.    Historical Provider, MD    triamterene-hydrochlorothiazide (MAXZIDE) 75-50 MG per tablet Take 1 tablet by mouth daily.    Historical Provider, MD  zolpidem (AMBIEN) 5 MG tablet TAKE 1 TABLET BY MOUTH AT BEDTIME AS NEEDED FOR SLEEP. 11/06/13   Tina Russian, MD   History   Social History  . Marital Status: Married    Spouse Name: N/A    Number of Children: N/A  . Years of Education: N/A   Occupational History  . Not on file.   Social History Main Topics  . Smoking status: Former  Smoker -- 1.50 packs/day for 18 years    Types: Cigarettes    Quit date: 09/22/1985  . Smokeless tobacco: Never Used  . Alcohol Use: Yes     Comment: 08/19/2013 "drank occasionally in my 20's"  . Drug Use: No     Comment: prescribed  . Sexual Activity: No   Other Topics Concern  . Not on file   Social History Narrative   Married.      Review of Systems     Objective:   Physical Exam  CONSTITUTIONAL: Well developed/well nourished HEAD: Normocephalic/atraumatic EYES: EOMI/PERRL ENMT: Mucous membranes moist NECK: supple no meningeal signs SPINE:entire spine nontender CV: S1/S2 noted, grade 2 murmur at left sternal border LUNGS: Lungs are clear to auscultation bilaterally, no apparent distress ABDOMEN: soft, nontender, no rebound or guarding GU:no cva tenderness NEURO: Pt is awake/alert, moves all extremitiesx4 EXTREMITIES: pulses normal, full ROM SKIN: warm, color normal PSYCH: no abnormalities of mood noted  Filed Vitals:   12/30/13 1308  BP: 132/79  Pulse: 94  Temp: 98.2 F (36.8 C)  TempSrc: Oral  Resp: 16  Height: 5' 5.5" (1.664 m)  Weight: 155 lb 6.4 oz (70.489 kg)  SpO2: 93%   Results for orders placed in visit on 09/23/13  CBC WITH DIFFERENTIAL      Result Value Ref Range   WBC 5.5  4.0 - 10.5 K/uL   RBC 3.76 (*) 3.87 - 5.11 MIL/uL   Hemoglobin 11.5 (*) 12.0 - 15.0 g/dL   HCT 34.1 (*) 36.0 - 46.0 %   MCV 90.7  78.0 - 100.0 fL   MCH 30.6  26.0 - 34.0 pg   MCHC 33.7  30.0 - 36.0 g/dL   RDW  14.7  11.5 - 15.5 %   Platelets 163  150 - 400 K/uL   Neutrophils Relative % 39 (*) 43 - 77 %   Neutro Abs 2.1  1.7 - 7.7 K/uL   Lymphocytes Relative 49 (*) 12 - 46 %   Lymphs Abs 2.7  0.7 - 4.0 K/uL   Monocytes Relative 8  3 - 12 %   Monocytes Absolute 0.4  0.1 - 1.0 K/uL   Eosinophils Relative 3  0 - 5 %   Eosinophils Absolute 0.2  0.0 - 0.7 K/uL   Basophils Relative 1  0 - 1 %   Basophils Absolute 0.0  0.0 - 0.1 K/uL   Smear Review Criteria for review not met    SEDIMENTATION RATE      Result Value Ref Range   Sed Rate 11  0 - 22 mm/hr  ANCA SCREEN W REFLEX TITER      Result Value Ref Range   c-ANCA Screen NEGATIVE  NEGATIVE   p-ANCA Screen NEGATIVE  NEGATIVE   Atypical p-ANCA Screen NEGATIVE  NEGATIVE  TSH      Result Value Ref Range   TSH 0.785  0.350 - 4.500 uIU/mL  T4, FREE      Result Value Ref Range   Free T4 1.32  0.80 - 1.80 ng/dL       Assessment & Plan:   Patient is eating better and has put on weight since her last visit. She felt much better on the higher dose of Synthroid 100 mcg so we'll reinstate that since she has put her weight back on. We'll need to see her in 3 months repeat TSH and T4 at that time. Otherwise no changes . I personally performed the services described in this documentation,  which was scribed in my presence. The recorded information has been reviewed and is accurate.

## 2014-01-19 ENCOUNTER — Telehealth: Payer: Self-pay

## 2014-03-02 ENCOUNTER — Other Ambulatory Visit: Payer: Self-pay | Admitting: Emergency Medicine

## 2014-03-03 ENCOUNTER — Other Ambulatory Visit: Payer: Self-pay | Admitting: Physician Assistant

## 2014-04-07 ENCOUNTER — Ambulatory Visit (INDEPENDENT_AMBULATORY_CARE_PROVIDER_SITE_OTHER): Payer: Medicare Other | Admitting: Emergency Medicine

## 2014-04-07 ENCOUNTER — Ambulatory Visit (INDEPENDENT_AMBULATORY_CARE_PROVIDER_SITE_OTHER): Payer: Medicare Other

## 2014-04-07 ENCOUNTER — Encounter: Payer: Self-pay | Admitting: Emergency Medicine

## 2014-04-07 VITALS — BP 134/66 | HR 93 | Temp 98.9°F | Resp 16 | Ht 65.5 in | Wt 163.4 lb

## 2014-04-07 DIAGNOSIS — R9389 Abnormal findings on diagnostic imaging of other specified body structures: Secondary | ICD-10-CM

## 2014-04-07 DIAGNOSIS — G894 Chronic pain syndrome: Secondary | ICD-10-CM

## 2014-04-07 DIAGNOSIS — Z8679 Personal history of other diseases of the circulatory system: Secondary | ICD-10-CM

## 2014-04-07 DIAGNOSIS — E039 Hypothyroidism, unspecified: Secondary | ICD-10-CM

## 2014-04-07 DIAGNOSIS — R918 Other nonspecific abnormal finding of lung field: Secondary | ICD-10-CM

## 2014-04-07 DIAGNOSIS — R0602 Shortness of breath: Secondary | ICD-10-CM

## 2014-04-07 LAB — CBC WITH DIFFERENTIAL/PLATELET
Basophils Absolute: 0.1 10*3/uL (ref 0.0–0.1)
Basophils Relative: 1 % (ref 0–1)
Eosinophils Absolute: 0.2 10*3/uL (ref 0.0–0.7)
Eosinophils Relative: 3 % (ref 0–5)
HCT: 36.5 % (ref 36.0–46.0)
Hemoglobin: 12.5 g/dL (ref 12.0–15.0)
Lymphocytes Relative: 39 % (ref 12–46)
Lymphs Abs: 2.3 10*3/uL (ref 0.7–4.0)
MCH: 29.8 pg (ref 26.0–34.0)
MCHC: 34.2 g/dL (ref 30.0–36.0)
MCV: 86.9 fL (ref 78.0–100.0)
Monocytes Absolute: 0.4 10*3/uL (ref 0.1–1.0)
Monocytes Relative: 7 % (ref 3–12)
Neutro Abs: 3 10*3/uL (ref 1.7–7.7)
Neutrophils Relative %: 50 % (ref 43–77)
Platelets: 165 10*3/uL (ref 150–400)
RBC: 4.2 MIL/uL (ref 3.87–5.11)
RDW: 13.4 % (ref 11.5–15.5)
WBC: 5.9 10*3/uL (ref 4.0–10.5)

## 2014-04-07 LAB — COMPLETE METABOLIC PANEL WITH GFR
ALT: 16 U/L (ref 0–35)
AST: 25 U/L (ref 0–37)
Albumin: 4.2 g/dL (ref 3.5–5.2)
Alkaline Phosphatase: 88 U/L (ref 39–117)
BUN: 27 mg/dL — ABNORMAL HIGH (ref 6–23)
CO2: 30 mEq/L (ref 19–32)
Calcium: 9.3 mg/dL (ref 8.4–10.5)
Chloride: 99 mEq/L (ref 96–112)
Creat: 1.12 mg/dL — ABNORMAL HIGH (ref 0.50–1.10)
GFR, Est African American: 60 mL/min
GFR, Est Non African American: 52 mL/min — ABNORMAL LOW
Glucose, Bld: 107 mg/dL — ABNORMAL HIGH (ref 70–99)
Potassium: 4 mEq/L (ref 3.5–5.3)
Sodium: 139 mEq/L (ref 135–145)
Total Bilirubin: 0.4 mg/dL (ref 0.2–1.2)
Total Protein: 6.7 g/dL (ref 6.0–8.3)

## 2014-04-07 MED ORDER — ALPRAZOLAM 0.5 MG PO TABS: 0.5000 mg | ORAL_TABLET | Freq: Three times a day (TID) | ORAL | Status: DC | PRN

## 2014-04-07 MED ORDER — ALPRAZOLAM 0.5 MG PO TBDP: 0.5000 mg | ORAL_TABLET | Freq: Three times a day (TID) | ORAL | Status: DC | PRN

## 2014-04-07 NOTE — Progress Notes (Addendum)
Subjective:    Patient ID: [name], female    DOB: 02/23/1951, 63 y.o.   MRN: [id number] This chart was scribed for [name], MD by Anastasia Pall, ED Scribe. This patient was seen in room 22 and the patient's care was started at 2:08 PM.  Chief Complaint  Patient presents with  . Hypothyroidism  . shortness of breath    x 1 month   HPI [name] is a 63 y.o. female Pt with h/o Hypothyroidism who presents with SOB, onset 1 month ago.   She states she stopped taking Methatrexate per Dr. Ouida Sills. She states she has been using her inhaler PRN with some relief. She reports she is tired, doesn't rest well at night. She still takes Ambien in the evenings. She states the Summer heat may be part of the cause of her SOB. She states she has always had a hard time breathing ever since her CHF. She also reports h/o emphysema. She states she quit smoking 20 years ago. 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 She denies fever, cough, congestion.   She states she has been taking her thyroid medication regularly. She reports she has been gaining some weight since her last visit. She reports constant back pain and has about 2 injections a year. Dr. Rochele Pages in [facility] follows her for pain management. [facility] is with [facility].   Patient Active Problem List   Diagnosis Date Noted  . Pneumonia, organism unspecified 08/19/2013  . Anxiety 11/28/2012  . Seizure disorder 11/28/2012  . Chest pain at rest, rule out pericarditis 11/27/2012  . History of pericarditis, with pericardial window in 2009, and recurrent episodes 2011,2013, treated with methotrexate 11/27/2012  . SOB (shortness of breath) 11/27/2012  . Hypothyroidism 11/27/2012  . History of partial nephrectomy, both rt. and lt 11/27/2012  . Hx of ovarian cancer 11/27/2012  . Venous insufficiency 11/27/2012  . Chronic back pain, hx of lumbar spondylosis and stenosis L5-S1  with hx decompression and total diskectomy 11/27/2012  . Hx of melanoma of skin 11/27/2012  . 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 DIARRHEA-PRESUMED INFECTIOUS 12/27/2009   Past Medical History  Diagnosis Date  . Chest pain at rest, rule out pericarditis 11/27/2012  . History of pericarditis, with pericardial window in 2009 11/27/2012  . Hypothyroidism 11/27/2012  . Venous insufficiency 11/27/2012  . Hx of melanoma of skin 11/27/2012  . Fibromyalgia   . CHF (congestive heart failure)   . Chronic kidney disease   . Anxiety   . Heart murmur   . Complication of anesthesia     "woke up twice during knee replacement" (08/19/2013)  . Pneumonia     "several times; including today", RUL/notes 08/19/2013  . SOB (shortness of breath)     "can happen at anytime; it's not all the time" (08/19/2013)  . Iron deficiency anemia   . Anemia   . Headache(784.0)     "felt like I've had one all day today; usually get headaches under stressful situations" (08/19/2013)  . Migraines     "maybe q 3-4 months" (08/19/2013)  . Arthritis     "everywhere" (08/19/2013)  . Chronic lower back pain   . Depression   . Hx of ovarian cancer 11/27/2012  . Kidney carcinoma     "both kidneys; ~ 3 yr apart" (08/19/2013)  . Melanoma of back   . Squamous carcinoma     "  face, side of my nose, chest; used acid to get rid of them" (08/19/2013)   Past Surgical History  Procedure Laterality Date  . Joint replacement    . Left oophorectomy Left 1979  . Lumbar disc surgery  1984    "ruptured disc"  . Fracture surgery    . Doppler echocardiography  02/23/2011    LVEF>50%  . Partial nephrectomy Right 2005; ~ 2008  . Pericardial window  ~ 2012  . Tonsillectomy  1954  . Appendectomy    . Carpal tunnel release Right ~ 1992  . Tubal ligation  1952  . Reduction mammaplasty Bilateral ~ 2008  . Shoulder arthroscopy w/ rotator cuff repair Right 1990's  . Total shoulder replacement Left 2006?  Marland Kitchen Total knee arthroplasty  Right 1990's  . Knee arthroscopy Right     "twice before the replacement" (08/19/2013)  . Abdominal hysterectomy  1979  . Right oophorectomy  ~ 2007    "it had cancer in it" (08/19/2013)  . Posterior lumbar fusion  2002?; 03/2012  . Melanoma excision  ~ 2010    "my lower back" (08/19/2013)   Allergies  Allergen Reactions  . Ampicillin Rash   Prior to Admission medications   Medication Sig Start Date End Date Taking? Authorizing Provider  Ascorbic Acid (VITAMIN C PO) Take 1 tablet by mouth daily.   Yes Historical Provider, MD  aspirin EC 81 MG EC tablet Take 1 tablet (81 mg total) by mouth daily. 11/28/12  Yes Luke K Kilroy, PA-C  buPROPion (WELLBUTRIN SR) 150 MG 12 hr tablet TAKE 1 TABLET BY MOUTH ONCE DAILY. 09/23/13  Yes [name], MD  Cyanocobalamin (VITAMIN B-12 PO) Take 1 tablet by mouth daily.   Yes Historical Provider, MD  cyclobenzaprine (FLEXERIL) 5 MG tablet Take 5 mg by mouth 3 (three) times daily as needed. For spasms 08/23/12  Yes Historical Provider, MD  diazepam (VALIUM) 5 MG tablet Take 1 tablet (5 mg total) by mouth every 8 (eight) hours as needed for anxiety. For anxiety 09/23/13  Yes [name], MD  diclofenac sodium (VOLTAREN) 1 % GEL Apply 2 g topically 4 (four) times daily as needed (for pain).   Yes Historical Provider, MD  HYDROcodone-acetaminophen (NORCO) 10-325 MG per tablet Take 1 tablet by mouth every 6 (six) hours as needed. For pain   Yes Historical Provider, MD  levothyroxine (SYNTHROID, LEVOTHROID) 100 MCG tablet Take 1 tablet (100 mcg total) by mouth daily. 12/30/13  Yes [name], MD  morphine (MS CONTIN) 30 MG 12 hr tablet Take 30 mg by mouth 2 (two) times daily.   Yes Historical Provider, MD  Multiple Vitamin (MULTIVITAMIN WITH MINERALS) TABS Take 1 tablet by mouth daily.   Yes Historical Provider, MD  PROAIR HFA 108 (90 BASE) MCG/ACT inhaler INHALE 2 PUFFS EVERY 4 HOURS AS NEEDED FOR WHEEZING, COUGH OR SHORTNESS OF BREATH   Yes [name], MD    rOPINIRole (REQUIP) 4 MG tablet Take 4 mg by mouth at bedtime.   Yes Historical Provider, MD  triamterene-hydrochlorothiazide (MAXZIDE) 75-50 MG per tablet Take 1 tablet by mouth daily.   Yes Historical Provider, MD  zolpidem (AMBIEN) 5 MG tablet TAKE 1 TABLET BY MOUTH AT BEDTIME AS NEEDED FOR SLEEP. 11/06/13  Yes [name], MD  folic acid (FOLVITE) 1 MG tablet Take 1 mg by mouth daily.    Historical Provider, MD  Methotrexate, PF, 25 MG/0.4ML SOAJ Inject 25 mg into the skin once a  week.    Historical Provider, MD  metoprolol tartrate (LOPRESSOR) 12.5 mg TABS Take 0.5 tablets (12.5 mg total) by mouth 2 (two) times daily. 11/28/12   Erlene Quan, PA-C   Review of Systems  Constitutional: Positive for fatigue. Negative for fever, chills, diaphoresis and unexpected weight change.  HENT: Negative for congestion.   Respiratory: Positive for shortness of breath. Negative for cough and wheezing.   Cardiovascular: Negative for chest pain.  Gastrointestinal: Negative for abdominal pain.  Musculoskeletal: Positive for back pain. Negative for neck pain.  Skin: Negative for rash.      Objective:   Physical Exam Nursing note and vitals reviewed. Constitutional: She is oriented to person, place, and time. She appears well-developed and well-nourished. No distress.  HENT:  Head: Normocephalic and atraumatic.  Eyes: Conjunctivae and EOM are normal. Pupils are equal, round, and reactive to light.  Neck: Normal range of motion. Neck supple. No thyromegaly present.  Cardiovascular: Normal rate, regular rhythm and normal heart sounds.   No murmur heard. Pulmonary/Chest: Effort normal and breath sounds normal. No respiratory distress. She exhibits no tenderness.  Abdominal: Soft. Bowel sounds are normal. She exhibits no mass. There is no hepatosplenomegaly. There is no tenderness. There is no rebound and no guarding.  Musculoskeletal: Normal range of motion.  Lymphadenopathy:    She has no cervical  adenopathy.  Neurological: She is alert and oriented to person, place, and time.  Skin: Skin is warm and dry.  Psychiatric: She has a normal mood and affect. Her behavior is normal.   BP 134/66  Pulse 93  Temp(Src) 98.9 F (37.2 C) (Oral)  Resp 16  Ht 5' 5.5" (1.664 m)  Wt 163 lb 6.4 oz (74.118 kg)  BMI 26.77 kg/m2  SpO2 97%  [facility] reading (PRIMARY) by Dr. Everlene Farrier there appears to be some scarring in the right upper lobe. This was an area of pneumonia is seen on previous chest x-ray and CT this chest x-ray was not able to be sent    Assessment & Plan:   Repeat CT ordered of the chest to evaluate the right upper lobe.. Cardiac size was normal showing no signs of pericarditis . She will continue her albuterol inhaler when necessary I personally performed the services described in this documentation, which was scribed in my presence. The recorded information has been reviewed and is accurate.

## 2014-04-08 LAB — TSH: TSH: 1.293 u[IU]/mL (ref 0.350–4.500)

## 2014-04-08 LAB — T4, FREE: Free T4: 1.17 ng/dL (ref 0.80–1.80)

## 2014-04-08 LAB — SEDIMENTATION RATE: Sed Rate: 16 mm/hr (ref 0–22)

## 2014-04-10 ENCOUNTER — Encounter: Payer: Self-pay | Admitting: Emergency Medicine

## 2014-04-17 ENCOUNTER — Inpatient Hospital Stay: Admission: RE | Admit: 2014-04-17 | Payer: Medicare Other | Source: Ambulatory Visit

## 2014-04-22 ENCOUNTER — Inpatient Hospital Stay: Admission: RE | Admit: 2014-04-22 | Payer: Medicare Other | Source: Ambulatory Visit

## 2014-04-28 ENCOUNTER — Other Ambulatory Visit: Payer: Self-pay | Admitting: Emergency Medicine

## 2014-04-29 ENCOUNTER — Ambulatory Visit
Admission: RE | Admit: 2014-04-29 | Discharge: 2014-04-29 | Disposition: A | Discharge status: Home / Self Care | Payer: Medicare Other | Source: Ambulatory Visit | Attending: Emergency Medicine | Admitting: Emergency Medicine

## 2014-04-29 DIAGNOSIS — R0602 Shortness of breath: Secondary | ICD-10-CM

## 2014-04-29 DIAGNOSIS — R9389 Abnormal findings on diagnostic imaging of other specified body structures: Secondary | ICD-10-CM

## 2014-04-30 NOTE — Telephone Encounter (Signed)
Refill - ambien Please advise.

## 2014-05-01 ENCOUNTER — Telehealth: Payer: Self-pay | Admitting: Emergency Medicine

## 2014-05-01 NOTE — Telephone Encounter (Signed)
Call patient the pneumonia has resolved. There are 2 small nodules present in the right upper lobe. She needs to have another CT scan done in 6 months to assure they remain stable. Please add this to her problem list  Pt wants to know if the nodules are related to her SOB? Also can wen RF her Ambien? Thanks

## 2014-05-01 NOTE — Telephone Encounter (Signed)
I suspect her shortness of breath is related to her cardiac problems. I would discuss these with her cardiologist. Ask her if she also has a pulmonary specialist and if she does we can make a referral to them.

## 2014-05-01 NOTE — Telephone Encounter (Signed)
Ambien faxed  

## 2014-05-01 NOTE — Telephone Encounter (Signed)
Pt prefers to hold off on pulmonary referral. About to lose ins. She says that her cardio said that she didn't have pericarditis and didn't say anything else about her SOB

## 2014-05-01 NOTE — Telephone Encounter (Signed)
Patient is calling about a scan she had done on her lungs. Also states that she has gone 3 days without her Ambien.   [phone]

## 2014-07-22 ENCOUNTER — Other Ambulatory Visit: Payer: Self-pay | Admitting: Emergency Medicine

## 2014-07-23 NOTE — Telephone Encounter (Signed)
Called in.

## 2014-07-28 ENCOUNTER — Ambulatory Visit (INDEPENDENT_AMBULATORY_CARE_PROVIDER_SITE_OTHER): Payer: Medicare Other | Admitting: Emergency Medicine

## 2014-07-28 VITALS — BP 140/75 | HR 92 | Temp 97.5°F | Resp 16 | Ht 66.0 in | Wt 160.0 lb

## 2014-07-28 DIAGNOSIS — Z23 Encounter for immunization: Secondary | ICD-10-CM

## 2014-07-28 DIAGNOSIS — E039 Hypothyroidism, unspecified: Secondary | ICD-10-CM

## 2014-07-28 MED ORDER — LEVOTHYROXINE SODIUM 100 MCG PO TABS: 100.0000 ug | ORAL_TABLET | Freq: Every day | ORAL | Status: DC

## 2014-07-28 NOTE — Progress Notes (Deleted)
   Subjective:    Patient ID: [name], female    DOB: 1951/05/01, 63 y.o.   MRN: [id number]  HPI    Review of Systems     Objective:   Physical Exam        Assessment & Plan:

## 2014-07-28 NOTE — Patient Instructions (Signed)
Influenza Vaccine (Flu Vaccine, Inactivated or Recombinant) 2014-2015: What You Need to Know 1. Why get vaccinated? Influenza ("flu") is a contagious disease that spreads around the [place] every winter, usually between October and May. Flu is caused by influenza viruses, and is spread mainly by coughing, sneezing, and close contact. Anyone can get flu, but the risk of getting flu is highest among children. Symptoms come on suddenly and may last several days. They can include:  fever/chills  sore throat  muscle aches  fatigue  cough  headache  runny or stuffy nose Flu can make some people much sicker than others. These people include young children, people 65 and older, pregnant women, and people with certain health conditions-such as heart, lung or kidney disease, nervous system disorders, or a weakened immune system. Flu vaccination is especially important for these people, and anyone in close contact with them. Flu can also lead to pneumonia, and make existing medical conditions worse. It can cause diarrhea and seizures in children. Each year thousands of people in the [place] die from flu, and many more are hospitalized. Flu vaccine is the best protection against flu and its complications. Flu vaccine also helps prevent spreading flu from person to person. 2. Inactivated and recombinant flu vaccines You are getting an injectable flu vaccine, which is either an "inactivated" or "recombinant" vaccine. These vaccines do not contain any live influenza virus. They are given by injection with a needle, and often called the "flu shot."  A different live, attenuated (weakened) influenza vaccine is sprayed into the nostrils. This vaccine is described in a separate Vaccine Information Statement. Flu vaccination is recommended every year. Some children 6 months through 8 years of age might need two doses during one year. Flu viruses are always changing. Each year's flu vaccine is made  to protect against 3 or 4 viruses that are likely to cause disease that year. Flu vaccine cannot prevent all cases of flu, but it is the best defense against the disease.  It takes about 2 weeks for protection to develop after the vaccination, and protection lasts several months to a year. Some illnesses that are not caused by influenza virus are often mistaken for flu. Flu vaccine will not prevent these illnesses. It can only prevent influenza. Some inactivated flu vaccine contains a very small amount of a mercury-based preservative called thimerosal. Studies have shown that thimerosal in vaccines is not harmful, but flu vaccines that do not contain a preservative are available. 3. Some people should not get this vaccine Tell the person who gives you the vaccine:  If you have any severe, life-threatening allergies. If you ever had a life-threatening allergic reaction after a dose of flu vaccine, or have a severe allergy to any part of this vaccine, including (for example) an allergy to gelatin, antibiotics, or eggs, you may be advised not to get vaccinated. Most, but not all, types of flu vaccine contain a small amount of egg protein.  If you ever had Guillain-Barr Syndrome (a severe paralyzing illness, also called GBS). Some people with a history of GBS should not get this vaccine. This should be discussed with your doctor.  If you are not feeling well. It is usually okay to get flu vaccine when you have a mild illness, but you might be advised to wait until you feel better. You should come back when you are better. 4. Risks of a vaccine reaction With a vaccine, like any medicine, there is a chance of side   effects. These are usually mild and go away on their own. Problems that could happen after any vaccine:  Brief fainting spells can happen after any medical procedure, including vaccination. Sitting or lying down for about 15 minutes can help prevent fainting, and injuries caused by a fall. Tell  your doctor if you feel dizzy, or have vision changes or ringing in the ears.  Severe shoulder pain and reduced range of motion in the arm where a shot was given can happen, very rarely, after a vaccination.  Severe allergic reactions from a vaccine are very rare, estimated at less than 1 in a million doses. If one were to occur, it would usually be within a few minutes to a few hours after the vaccination. Mild problems following inactivated flu vaccine:  soreness, redness, or swelling where the shot was given  hoarseness  sore, red or itchy eyes  cough  fever  aches  headache  itching  fatigue If these problems occur, they usually begin soon after the shot and last 1 or 2 days. Moderate problems following inactivated flu vaccine:  Young children who get inactivated flu vaccine and pneumococcal vaccine (PCV13) at the same time may be at increased risk for seizures caused by fever. Ask your doctor for more information. Tell your doctor if a child who is getting flu vaccine has ever had a seizure. Inactivated flu vaccine does not contain live flu virus, so you cannot get the flu from this vaccine. As with any medicine, there is a very remote chance of a vaccine causing a serious injury or death. The safety of vaccines is always being monitored. For more information, visit: www.cdc.gov/vaccinesafety/ 5. What if there is a serious reaction? What should I look for?  Look for anything that concerns you, such as signs of a severe allergic reaction, very high fever, or behavior changes. Signs of a severe allergic reaction can include hives, swelling of the face and throat, difficulty breathing, a fast heartbeat, dizziness, and weakness. These would start a few minutes to a few hours after the vaccination. What should I do?  If you think it is a severe allergic reaction or other emergency that can't wait, call 9-1-1 and get the person to the nearest hospital. Otherwise, call your  doctor.  Afterward, the reaction should be reported to the Vaccine Adverse Event Reporting System (VAERS). Your doctor should file this report, or you can do it yourself through the VAERS web site at www.vaers.hhs.gov, or by calling 1-800-822-7967. VAERS does not give medical advice. 6. The National Vaccine Injury Compensation Program The National Vaccine Injury Compensation Program (VICP) is a federal program that was created to compensate people who may have been injured by certain vaccines. Persons who believe they may have been injured by a vaccine can learn about the program and about filing a claim by calling 1-800-338-2382 or visiting the VICP website at www.hrsa.gov/vaccinecompensation. There is a time limit to file a claim for compensation. 7. How can I learn more?  Ask your health care provider.  Call your local or state health department.  Contact the Centers for Disease Control and Prevention (CDC):  Call 1-800-232-4636 (1-800-CDC-INFO) or  Visit CDC's website at www.cdc.gov/flu CDC Vaccine Information Statement (Interim) Inactivated Influenza Vaccine (06/10/2013) Document Released: 08/03/2006 Document Revised: 02/23/2014 Document Reviewed: 09/26/2013 ExitCare Patient Information 2015 [employer]. This information is not intended to replace advice given to you by your health care provider. Make sure you discuss any questions you have with your health   care provider.  

## 2014-07-28 NOTE — Progress Notes (Signed)
Subjective:    Patient ID: [name], female    DOB: Aug 13, 1951, 63 y.o.   MRN: [id number]  HPI Patient presents today for follow up and is doing well. Has questions in regards to her most recent CXR. Not following with cardiologist closely any longer as pericarditis resolved. Nephrologist following for chronic hematuria and right kidney lesion appt later this year (2015). Still seeing Dr. Rochele Pages for chronic pain management. Only needs inhaler once/month and denies current SOB or wheezing. Has hot flashes once per week. Not related to times she eats, takes meds, time of day and denies recent illness.  Only needs refill of synthyroid. Has not missed any doses of any medications.  Dentist did panoramic of mouth and found significant bone loss of the maxilla. Wearing dentures currently.  States that she remains active by doing yard work and walking her dog.   Health maintanence: Recived flu vaccine today and pneumococcal vaccine 2014.  Review of Systems  Constitutional: Negative for fever, chills, activity change, appetite change and fatigue.  HENT: Negative for congestion, ear pain, facial swelling, rhinorrhea, sinus pressure and sore throat.   Respiratory: Negative for shortness of breath.   Cardiovascular: Negative for chest pain, palpitations and leg swelling.  Gastrointestinal: Negative for nausea, vomiting, abdominal pain, diarrhea and constipation.  Genitourinary: Positive for frequency.  Musculoskeletal: Positive for arthralgias, back pain, joint swelling (hands, back), myalgias and neck pain.  Allergic/Immunologic: Negative for environmental allergies and food allergies.  Neurological: Negative for dizziness, weakness, light-headedness and headaches.  Hematological: Bruises/bleeds easily.  Psychiatric/Behavioral: Negative for behavioral problems and dysphoric mood. The patient is not nervous/anxious.        Objective:   Physical Exam  Constitutional: She is oriented to  person, place, and time. She appears well-developed and well-nourished. No distress.  HENT:  Head: Normocephalic and atraumatic.  Right Ear: External ear and ear canal normal. No drainage or tenderness. Tympanic membrane is scarred. Tympanic membrane is not injected, not perforated, not erythematous and not bulging. No middle ear effusion.  Left Ear: Tympanic membrane, external ear and ear canal normal. No drainage or tenderness. Tympanic membrane is not injected, not perforated, not erythematous and not bulging.  No middle ear effusion.  Mouth/Throat: No oropharyngeal exudate.  Eyes: Conjunctivae and EOM are normal. Pupils are equal, round, and reactive to light. Right eye exhibits no discharge. Left eye exhibits no discharge. Scleral icterus is present.  Neck: Neck supple. No JVD present. No thyromegaly present.  Cardiovascular: Normal rate, regular rhythm and normal heart sounds.  Exam reveals no friction rub.   No murmur heard. Pulmonary/Chest: Effort normal and breath sounds normal. No stridor. No respiratory distress. She has no wheezes. She has no rales.  Abdominal: Soft. Bowel sounds are normal. She exhibits no distension and no mass. There is no tenderness.  Musculoskeletal: She exhibits no edema and no tenderness.       Right shoulder: She exhibits decreased range of motion and pain. She exhibits no tenderness, no swelling, no deformity and normal strength.       Left shoulder: She exhibits decreased range of motion and pain. She exhibits no tenderness, no swelling, no deformity and normal strength.  Lymphadenopathy:    She has cervical adenopathy.  Neurological: She is alert and oriented to person, place, and time. She displays abnormal reflex.  Reflex Scores:      Patellar reflexes are 0 on the right side and 0 on the left side. Skin: Skin is warm and  dry. No rash noted. She is not diaphoretic. No erythema. No pallor.  Psychiatric: She has a normal mood and affect. Her behavior is  normal. Judgment and thought content normal.   Blood pressure 140/75, pulse 92, temperature 97.5 F (36.4 C), resp. rate 16, height 5\' 6"  (1.676 m), weight 160 lb (72.576 kg), SpO2 97.00%.      Assessment & Plan:  We'll plan a repeat CT chest in January. These are to followup on 2 small nodules. She has a urologist who follows her for her history of renal cell cancer 1. Hypothyroidism, unspecified hypothyroidism type - levothyroxine (SYNTHROID, LEVOTHROID) 100 MCG tablet; Take 1 tablet (100 mcg total) by mouth daily.  Dispense: 30 tablet; Refill: 11 - Has appt with nephrologist next week and labs will be drawn at that time and sent here  2. Need for prophylactic vaccination and inoculation against influenza - Flu Vaccine QUAD 36+ mos IM  Tishira Brewington PA-C  [facility] 07/28/2014 3:03 PM

## 2014-08-18 ENCOUNTER — Other Ambulatory Visit: Payer: Self-pay | Admitting: Emergency Medicine

## 2014-08-19 ENCOUNTER — Telehealth: Payer: Self-pay | Admitting: Emergency Medicine

## 2014-08-20 ENCOUNTER — Other Ambulatory Visit: Payer: Self-pay | Admitting: Family Medicine

## 2014-08-20 ENCOUNTER — Other Ambulatory Visit: Payer: Self-pay | Admitting: *Deleted

## 2014-08-20 MED ORDER — ZOLPIDEM TARTRATE 5 MG PO TABS
5.0000 mg | ORAL_TABLET | Freq: Every evening | ORAL | Status: DC | PRN
Start: 2014-08-20 — End: 2014-10-27

## 2014-08-20 NOTE — Telephone Encounter (Signed)
Called in by Grant Fontana and she notified pt.

## 2014-08-20 NOTE — Telephone Encounter (Signed)
Pt is about to lose her insurance. Can we send in a #90 for her? Her insurance expires tonight at midnight.

## 2014-08-20 NOTE — Telephone Encounter (Signed)
Pt insurance cancels at midnight today. Please advise refill

## 2014-08-21 ENCOUNTER — Other Ambulatory Visit: Payer: Self-pay | Admitting: Radiology

## 2014-08-21 NOTE — Telephone Encounter (Signed)
This was called in last night- it was refill for Ambien.

## 2014-08-21 NOTE — Telephone Encounter (Signed)
Dr Everlene Farrier has discussed with me, he indicates can not renew any controlled substances for more than 30 days (due to previous suicidal thoughts/attempt). He will approve 30 days only, is this request for Ambien?

## 2014-08-21 NOTE — Telephone Encounter (Signed)
error 

## 2014-08-21 NOTE — Telephone Encounter (Signed)
What medication does she need for  90 days ?Marland Kitchen I do not feel comfortable giving her 90 day supplies of medicine

## 2014-08-22 NOTE — Telephone Encounter (Signed)
Dr. Tamala Julian wrote her for 90 days of Ambien Thursday night.

## 2014-09-30 ENCOUNTER — Other Ambulatory Visit: Payer: Self-pay | Admitting: Emergency Medicine

## 2014-09-30 NOTE — Telephone Encounter (Signed)
Dr Everlene Farrier, pt was here for 3 mos Check up in Oct, but don't see anx/dep discussed. Ok to RF?

## 2014-10-12 ENCOUNTER — Other Ambulatory Visit: Payer: Self-pay | Admitting: Emergency Medicine

## 2014-10-12 NOTE — Telephone Encounter (Signed)
Pt shouldn't be out until 10/22/14.

## 2014-10-13 ENCOUNTER — Other Ambulatory Visit: Payer: Self-pay | Admitting: Radiology

## 2014-10-13 NOTE — Telephone Encounter (Signed)
Faxed xanax to pharmacy

## 2014-10-27 ENCOUNTER — Ambulatory Visit (INDEPENDENT_AMBULATORY_CARE_PROVIDER_SITE_OTHER): Payer: Medicare Other | Admitting: Emergency Medicine

## 2014-10-27 ENCOUNTER — Encounter: Payer: Self-pay | Admitting: Emergency Medicine

## 2014-10-27 ENCOUNTER — Other Ambulatory Visit: Payer: Self-pay | Admitting: Emergency Medicine

## 2014-10-27 VITALS — BP 146/70 | HR 82 | Temp 98.0°F | Resp 16 | Ht 65.5 in | Wt 165.0 lb

## 2014-10-27 DIAGNOSIS — R918 Other nonspecific abnormal finding of lung field: Secondary | ICD-10-CM

## 2014-10-27 DIAGNOSIS — Z1273 Encounter for screening for malignant neoplasm of ovary: Secondary | ICD-10-CM

## 2014-10-27 DIAGNOSIS — E039 Hypothyroidism, unspecified: Secondary | ICD-10-CM

## 2014-10-27 DIAGNOSIS — C649 Malignant neoplasm of unspecified kidney, except renal pelvis: Secondary | ICD-10-CM

## 2014-10-27 LAB — COMPREHENSIVE METABOLIC PANEL
ALT: 17 U/L (ref 0–35)
AST: 23 U/L (ref 0–37)
Albumin: 4 g/dL (ref 3.5–5.2)
Alkaline Phosphatase: 70 U/L (ref 39–117)
BUN: 19 mg/dL (ref 6–23)
CO2: 28 mEq/L (ref 19–32)
Calcium: 9.6 mg/dL (ref 8.4–10.5)
Chloride: 102 mEq/L (ref 96–112)
Creat: 0.96 mg/dL (ref 0.50–1.10)
Glucose, Bld: 87 mg/dL (ref 70–99)
Potassium: 3.9 mEq/L (ref 3.5–5.3)
Sodium: 139 mEq/L (ref 135–145)
Total Bilirubin: 0.4 mg/dL (ref 0.2–1.2)
Total Protein: 6.5 g/dL (ref 6.0–8.3)

## 2014-10-27 LAB — TSH: TSH: 0.301 u[IU]/mL — ABNORMAL LOW (ref 0.350–4.500)

## 2014-10-27 MED ORDER — LEVOTHYROXINE SODIUM 100 MCG PO TABS
100.0000 ug | ORAL_TABLET | Freq: Every day | ORAL | Status: DC
Start: 2014-10-27 — End: 2015-11-22

## 2014-10-27 MED ORDER — ZOLPIDEM TARTRATE 5 MG PO TABS: 5.0000 mg | ORAL_TABLET | Freq: Every evening | ORAL | Status: DC | PRN

## 2014-10-27 MED ORDER — METOPROLOL TARTRATE 25 MG PO TABS: ORAL_TABLET | ORAL | Status: DC

## 2014-10-27 MED ORDER — ALPRAZOLAM 0.5 MG PO TABS: ORAL_TABLET | ORAL | Status: DC

## 2014-10-27 NOTE — Progress Notes (Addendum)
Subjective:  This chart was scribed for Tina Jordan, MD by [name], ED Scribe at [facility].The patient was seen in exam room 22 and the patient's care was started at 1:48 PM.   Patient ID: [name], female    DOB: [birth year], 64 y.o.   MRN: [id number] Chief Complaint  Patient presents with  . Follow-up  . thyroid and blood work   HPI HPI Comments: [name] is a 64 y.o. female who presents to [facility] for a follow up and blood work. Pt scheduled her mammogram on January 16th. Pt has a hx melanoma, ovarian and kidney carcinoma. Pt quit smoking 29 years ago, her husband quit at the same time as well. Sees Dr. Einar [name] as needed for pericarditis. She is on synthroid for her hypothyroidism. Pt has a history of pneumonia as well.   Patient Active Problem List   Diagnosis Date Noted  . Pneumonia, organism unspecified 08/19/2013  . Anxiety 11/28/2012  . Seizure disorder 11/28/2012  . Chest pain at rest, rule out pericarditis 11/27/2012  . History of pericarditis, with pericardial window in 2009, and recurrent episodes 2011,2013, treated with methotrexate 11/27/2012  . SOB (shortness of breath) 11/27/2012  . Hypothyroidism 11/27/2012  . History of partial nephrectomy, both rt. and lt 11/27/2012  . Hx of ovarian cancer 11/27/2012  . Venous insufficiency 11/27/2012  . Chronic back pain, hx of lumbar spondylosis and stenosis L5-S1 with hx decompression and total diskectomy 11/27/2012  . Hx of melanoma of skin 11/27/2012  . 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 DIARRHEA-PRESUMED INFECTIOUS 12/27/2009   Past Medical History  Diagnosis Date  . Chest pain at rest, rule out pericarditis 11/27/2012  . History of pericarditis, with pericardial window in 2009 11/27/2012  . Hypothyroidism 11/27/2012  . Venous insufficiency 11/27/2012  . Hx of melanoma of skin 11/27/2012  . Fibromyalgia   . CHF (congestive heart failure)   . Chronic kidney disease   . Anxiety   .  Heart murmur   . Complication of anesthesia     "woke up twice during knee replacement" (08/19/2013)  . Pneumonia     "several times; including today", RUL/notes 08/19/2013  . SOB (shortness of breath)     "can happen at anytime; it's not all the time" (08/19/2013)  . Iron deficiency anemia   . Anemia   . Headache(784.0)     "felt like I've had one all day today; usually get headaches under stressful situations" (08/19/2013)  . Migraines     "maybe q 3-4 months" (08/19/2013)  . Arthritis     "everywhere" (08/19/2013)  . Chronic lower back pain   . Depression   . Hx of ovarian cancer 11/27/2012  . Kidney carcinoma     "both kidneys; ~ 3 yr apart" (08/19/2013)  . Melanoma of back   . Squamous carcinoma     "face, side of my nose, chest; used acid to get rid of them" (08/19/2013)   Past Surgical History  Procedure Laterality Date  . Joint replacement    . Left oophorectomy Left 1979  . Lumbar disc surgery  1984    "ruptured disc"  . Fracture surgery    . Doppler echocardiography  02/23/2011    LVEF>50%  . Partial nephrectomy Right 2005; ~ 2008  . Pericardial window  ~ 2012  . Tonsillectomy  1954  . Appendectomy    . Carpal tunnel release Right ~ 1992  . Tubal  ligation  1952  . Reduction mammaplasty Bilateral ~ 2008  . Shoulder arthroscopy w/ rotator cuff repair Right 1990's  . Total shoulder replacement Left 2006?  Marland Kitchen Total knee arthroplasty Right 1990's  . Knee arthroscopy Right     "twice before the replacement" (08/19/2013)  . Abdominal hysterectomy  1979  . Right oophorectomy  ~ 2007    "it had cancer in it" (08/19/2013)  . Posterior lumbar fusion  2002?; 03/2012  . Melanoma excision  ~ 2010    "my lower back" (08/19/2013)   Allergies  Allergen Reactions  . Ampicillin Rash   Prior to Admission medications   Medication Sig Start Date End Date Taking? Authorizing Provider  ALPRAZolam Duanne Moron) 0.5 MG tablet TAKE 1 TABLET BY MOUTH 3 TIMES DAILY AS NEEDED FOR ANXIETY  10/13/14  Yes [name], MD  Ascorbic Acid (VITAMIN C PO) Take 1 tablet by mouth daily.   Yes Historical Provider, MD  aspirin EC 81 MG EC tablet Take 1 tablet (81 mg total) by mouth daily. 11/28/12  Yes Luke K Kilroy, PA-C  buPROPion (WELLBUTRIN SR) 150 MG 12 hr tablet TAKE 1 TABLET BY MOUTH DAILY. 10/01/14  Yes [name], MD  Cyanocobalamin (VITAMIN B-12 PO) Take 1 tablet by mouth daily.   Yes Historical Provider, MD  cyclobenzaprine (FLEXERIL) 5 MG tablet Take 5 mg by mouth 3 (three) times daily as needed. For spasms 08/23/12  Yes Historical Provider, MD  diclofenac sodium (VOLTAREN) 1 % GEL Apply 2 g topically 4 (four) times daily as needed (for pain).   Yes Historical Provider, MD  HYDROcodone-acetaminophen (NORCO) 10-325 MG per tablet Take 1 tablet by mouth every 6 (six) hours as needed. For pain   Yes Historical Provider, MD  levothyroxine (SYNTHROID, LEVOTHROID) 100 MCG tablet Take 1 tablet (100 mcg total) by mouth daily. 07/28/14  Yes Tishira R Brewington, PA-C  morphine (MS CONTIN) 30 MG 12 hr tablet Take 30 mg by mouth 2 (two) times daily.   Yes Historical Provider, MD  Multiple Vitamin (MULTIVITAMIN WITH MINERALS) TABS Take 1 tablet by mouth daily.   Yes Historical Provider, MD  PROAIR HFA 108 (90 BASE) MCG/ACT inhaler INHALE 2 PUFFS EVERY 4 HOURS AS NEEDED FOR WHEEZING, COUGH OR SHORTNESS OF BREATH   Yes [name], MD  rOPINIRole (REQUIP) 4 MG tablet Take 4 mg by mouth at bedtime.   Yes Historical Provider, MD  triamterene-hydrochlorothiazide (MAXZIDE) 75-50 MG per tablet Take 1 tablet by mouth daily.   Yes Historical Provider, MD  zolpidem (AMBIEN) 5 MG tablet Take 1 tablet (5 mg total) by mouth at bedtime as needed for sleep. 08/20/14  Yes Wardell Honour, MD  diazepam (VALIUM) 5 MG tablet Take 1 tablet (5 mg total) by mouth every 8 (eight) hours as needed for anxiety. For anxiety Patient not taking: Reported on 10/27/2014 09/23/13   [name], MD  folic acid (FOLVITE) 1 MG  tablet Take 1 mg by mouth daily.    Historical Provider, MD  metoprolol tartrate (LOPRESSOR) 12.5 mg TABS Take 0.5 tablets (12.5 mg total) by mouth 2 (two) times daily. Patient not taking: Reported on 10/27/2014 11/28/12   Erlene Quan, PA-C   History   Social History  . Marital Status: Married    Spouse Name: N/A    Number of Children: N/A  . Years of Education: N/A   Occupational History  . Not on file.   Social History Main Topics  . Smoking  status: Former Smoker -- 1.50 packs/day for 18 years    Types: Cigarettes    Quit date: 09/22/1985  . Smokeless tobacco: Never Used  . Alcohol Use: Yes     Comment: 08/19/2013 "drank occasionally in my 20's"  . Drug Use: No     Comment: prescribed  . Sexual Activity: No   Other Topics Concern  . Not on file   Social History Narrative   Married.   Review of Systems     Objective:  BP 158/76 mmHg  Pulse 82  Temp(Src) 98 F (36.7 C) (Oral)  Resp 16  Ht 5' 5.5" (1.664 m)  Wt 165 lb (74.844 kg)  BMI 27.03 kg/m2  SpO2 97%  Physical Exam  Constitutional: She is oriented to person, place, and time. She appears well-developed and well-nourished. No distress.  HENT:  Head: Normocephalic and atraumatic.  Eyes: EOM are normal.  Neck: Normal range of motion.  Cardiovascular: Normal rate.   Pulmonary/Chest: Effort normal.  Neurological: She is alert and oriented to person, place, and time. No cranial nerve deficit. She exhibits normal muscle tone. Coordination normal.  Skin: Skin is warm and dry.  Psychiatric: She has a normal mood and affect. Her behavior is normal.  Nursing note and vitals reviewed.      Assessment & Plan:  She is placed on Lopressor 25 a half tablet twice a day. Her Xanax and Synthroid and Ambien were refilled. We'll recheck in 3-4 months. She looks good today.I personally performed the services described in this documentation, which was scribed in my presence. The recorded information has been reviewed and is  accurate. CT chest was ordered to follow-up on pulmonary nodules from CT in [place].

## 2014-10-27 NOTE — Progress Notes (Deleted)
   Subjective:    Patient ID: [name], female    DOB: 1951/08/21, 64 y.o.   MRN: [id number]  HPI    Review of Systems     Objective:   Physical Exam        Assessment & Plan:

## 2014-10-28 LAB — CBC
HCT: 37.7 % (ref 36.0–46.0)
Hemoglobin: 12.6 g/dL (ref 12.0–15.0)
MCH: 29.6 pg (ref 26.0–34.0)
MCHC: 33.4 g/dL (ref 30.0–36.0)
MCV: 88.7 fL (ref 78.0–100.0)
MPV: 11.8 fL (ref 8.6–12.4)
Platelets: 176 10*3/uL (ref 150–400)
RBC: 4.25 MIL/uL (ref 3.87–5.11)
RDW: 13.8 % (ref 11.5–15.5)
WBC: 6.2 10*3/uL (ref 4.0–10.5)

## 2014-10-28 LAB — CA 125: CA 125: 8 U/mL (ref <35)

## 2014-10-28 LAB — T4, FREE: Free T4: 1.54 ng/dL (ref 0.80–1.80)

## 2014-11-02 ENCOUNTER — Ambulatory Visit
Admission: RE | Admit: 2014-11-02 | Discharge: 2014-11-02 | Disposition: A | Discharge status: Home / Self Care | Payer: Medicare Other | Source: Ambulatory Visit | Attending: Emergency Medicine | Admitting: Emergency Medicine

## 2014-11-02 DIAGNOSIS — R918 Other nonspecific abnormal finding of lung field: Secondary | ICD-10-CM

## 2014-11-06 LAB — HM MAMMOGRAPHY

## 2014-11-14 ENCOUNTER — Other Ambulatory Visit: Payer: Self-pay | Admitting: Emergency Medicine

## 2014-11-16 NOTE — Telephone Encounter (Signed)
faxed

## 2014-12-05 ENCOUNTER — Other Ambulatory Visit: Payer: Self-pay | Admitting: Emergency Medicine

## 2014-12-15 ENCOUNTER — Telehealth: Payer: Self-pay | Admitting: Emergency Medicine

## 2014-12-15 NOTE — Telephone Encounter (Signed)
Patient states that she was recently diagnosed with COPD by her cardiologist. She states that they advised her to change the Lopressor 5 mg to something else immediately.   [phone]

## 2014-12-16 ENCOUNTER — Telehealth: Payer: Self-pay | Admitting: Emergency Medicine

## 2014-12-16 NOTE — Telephone Encounter (Signed)
I will call pt.

## 2014-12-16 NOTE — Telephone Encounter (Signed)
FYI

## 2014-12-16 NOTE — Telephone Encounter (Signed)
I called patient and told her there was no emphysema on her previous CT. I told her to continue the Lopressor and make an appointment to see me and we can discuss her medications

## 2014-12-16 NOTE — Telephone Encounter (Signed)
She had a recent CT which did not show any signs of emphysema. Who gave her a diagnosis of COPD. Some patients on a beta blocker have some wheezing but there are many patients on a beta blocker who have COPD. Can she get me the records of the physician that diagnosed her with COPD and how the diagnosis was made and then we can discuss a medication change

## 2014-12-17 NOTE — Telephone Encounter (Signed)
Left message for pt to call back  °

## 2014-12-18 NOTE — Telephone Encounter (Signed)
Spoke with pt, she spoke with Dr. Everlene Farrier already and has an appt for Tuesday to see him.

## 2014-12-22 ENCOUNTER — Ambulatory Visit (INDEPENDENT_AMBULATORY_CARE_PROVIDER_SITE_OTHER): Payer: Medicare Other | Admitting: Emergency Medicine

## 2014-12-22 VITALS — BP 122/78 | HR 77 | Temp 98.4°F | Resp 16 | Ht 65.5 in | Wt 164.0 lb

## 2014-12-22 DIAGNOSIS — R0602 Shortness of breath: Secondary | ICD-10-CM | POA: Diagnosis not present

## 2014-12-22 NOTE — Progress Notes (Signed)
   Subjective:    Patient ID: [name], female    DOB: Mar 18, 1951, 63 y.o.   MRN: [id number] This chart was scribed for [name], MD by Zola Button, Medical Scribe. This patient was seen in room 23 and the patient's care was started at 1:39 PM.   HPI HPI Comments: [name] is a 64 y.o. female who presents to the [facility] for a follow-up. When she talked about getting a refill of her Toprol, one of the nurses at Dr. Irven Shelling office advised her to get off of that because she has a note in her record about COPD. I advised her to come in to discuss the issue to see if the medication is appropriate for her or not. Patient notes having occasional SOB and left knee pain.   Review of Systems  Respiratory: Positive for shortness of breath.   Musculoskeletal: Positive for arthralgias.       Objective:   Physical Exam CONSTITUTIONAL: Well developed/well nourished HEAD: Normocephalic/atraumatic EYES: EOM/PERRL ENMT: Mucous membranes moist NECK: supple no meningeal signs SPINE: entire spine nontender CV: S1/S2 noted, no murmurs/rubs/gallops noted LUNGS: Lungs are clear to auscultation bilaterally, no apparent distress ABDOMEN: soft, nontender, no rebound or guarding GU: no cva tenderness NEURO: Pt is awake/alert, moves all extremitiesx4 EXTREMITIES: pulses normal, full ROM SKIN: warm, color normal there is a 1 x 1 cm wartlike area on the mid back. PSYCH: no abnormalities of mood noted  PFT normal      Assessment & Plan:  This patient does not have COPD. She can continue her Toprol. She is going to see her dermatologist regarding the lesion on her back.I personally performed the services described in this documentation, which was scribed in my presence. The recorded information has been reviewed and is accurate.

## 2014-12-30 ENCOUNTER — Other Ambulatory Visit: Payer: Self-pay | Admitting: Physician Assistant

## 2015-01-13 ENCOUNTER — Telehealth: Payer: Self-pay

## 2015-01-13 NOTE — Telephone Encounter (Signed)
Pt states her allergies are really bad and would like Dr.Daub to call her something in for it. Please call [name]

## 2015-01-14 MED ORDER — AZELASTINE HCL 0.1 % NA SOLN: 2.0000 | Freq: Two times a day (BID) | NASAL | Status: DC

## 2015-01-14 NOTE — Telephone Encounter (Signed)
Spoke with pt, advised Rx sent in. 

## 2015-01-14 NOTE — Telephone Encounter (Signed)
Dr. Everlene Farrier, please advise. Spoke with pt, she states she may need a a nasal spray to help her with allergies.

## 2015-01-14 NOTE — Telephone Encounter (Signed)
Please call in Astepro 1-2 puffs each nares twice a day

## 2015-01-25 ENCOUNTER — Telehealth: Payer: Self-pay

## 2015-01-25 NOTE — Telephone Encounter (Signed)
Her I do not think thais a good idea because Armour Thyroid will probably  be taken off the market soon.

## 2015-01-25 NOTE — Telephone Encounter (Signed)
Gave pt message.

## 2015-01-25 NOTE — Telephone Encounter (Signed)
Dr. Everlene Farrier   Patient is requesting to be switched to [facility] for her thyroid.  It worked well for her in the past and it is a natural product.   [facility] in [place]

## 2015-02-08 ENCOUNTER — Other Ambulatory Visit: Payer: Self-pay | Admitting: Emergency Medicine

## 2015-02-09 NOTE — Telephone Encounter (Signed)
Dr Everlene Farrier, you saw pt for check up in Jan, but don't see this med discussed. Do you want to RF?

## 2015-02-22 ENCOUNTER — Other Ambulatory Visit: Payer: Self-pay | Admitting: Emergency Medicine

## 2015-02-24 NOTE — Telephone Encounter (Signed)
Faxed

## 2015-03-09 ENCOUNTER — Ambulatory Visit: Payer: Medicare Other | Admitting: Emergency Medicine

## 2015-03-30 ENCOUNTER — Encounter: Payer: Self-pay | Admitting: *Deleted

## 2015-04-22 ENCOUNTER — Other Ambulatory Visit: Payer: Self-pay | Admitting: Emergency Medicine

## 2015-04-23 DIAGNOSIS — G5600 Carpal tunnel syndrome, unspecified upper limb: Secondary | ICD-10-CM | POA: Insufficient documentation

## 2015-04-27 ENCOUNTER — Encounter: Payer: Self-pay | Admitting: Emergency Medicine

## 2015-04-27 ENCOUNTER — Ambulatory Visit (INDEPENDENT_AMBULATORY_CARE_PROVIDER_SITE_OTHER): Payer: Medicare Other | Admitting: Emergency Medicine

## 2015-04-27 VITALS — BP 124/78 | HR 96 | Temp 98.8°F | Resp 16 | Ht 66.0 in | Wt 170.0 lb

## 2015-04-27 DIAGNOSIS — E039 Hypothyroidism, unspecified: Secondary | ICD-10-CM | POA: Diagnosis not present

## 2015-04-27 DIAGNOSIS — Z139 Encounter for screening, unspecified: Secondary | ICD-10-CM | POA: Diagnosis not present

## 2015-04-27 DIAGNOSIS — C649 Malignant neoplasm of unspecified kidney, except renal pelvis: Secondary | ICD-10-CM | POA: Diagnosis not present

## 2015-04-27 DIAGNOSIS — Z1389 Encounter for screening for other disorder: Secondary | ICD-10-CM

## 2015-04-27 DIAGNOSIS — Z01818 Encounter for other preprocedural examination: Secondary | ICD-10-CM | POA: Diagnosis not present

## 2015-04-27 DIAGNOSIS — Z23 Encounter for immunization: Secondary | ICD-10-CM | POA: Diagnosis not present

## 2015-04-27 LAB — COMPREHENSIVE METABOLIC PANEL
ALT: 16 U/L (ref 0–35)
AST: 24 U/L (ref 0–37)
Albumin: 3.9 g/dL (ref 3.5–5.2)
Alkaline Phosphatase: 76 U/L (ref 39–117)
BUN: 30 mg/dL — ABNORMAL HIGH (ref 6–23)
CO2: 30 mEq/L (ref 19–32)
Calcium: 9.5 mg/dL (ref 8.4–10.5)
Chloride: 94 mEq/L — ABNORMAL LOW (ref 96–112)
Creat: 0.95 mg/dL (ref 0.50–1.10)
Glucose, Bld: 97 mg/dL (ref 70–99)
Potassium: 3.9 mEq/L (ref 3.5–5.3)
Sodium: 134 mEq/L — ABNORMAL LOW (ref 135–145)
Total Bilirubin: 0.4 mg/dL (ref 0.2–1.2)
Total Protein: 6.5 g/dL (ref 6.0–8.3)

## 2015-04-27 LAB — CBC
HCT: 37.1 % (ref 36.0–46.0)
Hemoglobin: 12.2 g/dL (ref 12.0–15.0)
MCH: 28.8 pg (ref 26.0–34.0)
MCHC: 32.9 g/dL (ref 30.0–36.0)
MCV: 87.7 fL (ref 78.0–100.0)
MPV: 11.4 fL (ref 8.6–12.4)
Platelets: 186 10*3/uL (ref 150–400)
RBC: 4.23 MIL/uL (ref 3.87–5.11)
RDW: 14.1 % (ref 11.5–15.5)
WBC: 4.2 10*3/uL (ref 4.0–10.5)

## 2015-04-27 NOTE — Progress Notes (Signed)
   Subjective:    Patient ID: [name], female    DOB: 04-07-1951, 64 y.o.   MRN: [id number]  HPI This is a 64 yo patient who presents today for preop clearance before total knee replacement that is scheduled for 05/30/15.   She has an extensive past medical history significant for pericarditis, hypothyroidism, melanoma, ovarian cancer, right total knee replacement, pneumonia, bilateral partial nephrectomy, chronic back pain, anxiety and carpel tunnel syndrome.   She sees Dr. Esther Hardy annually. Has had mammogram within the last year.  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    Review of Systems No chest pain, occasional SOB- uses inhaler PRN (1x/ month), no edema,     Objective:   Physical Exam  Constitutional: She is oriented to person, place, and time. She appears well-developed and well-nourished.  HENT:  Head: Normocephalic and atraumatic.  Right Ear: Tympanic membrane, external ear and ear canal normal.  Left Ear: Tympanic membrane, external ear and ear canal normal.  Nose: Nose normal.  Eyes: Conjunctivae are normal. Pupils are equal, round, and reactive to light.  Neck: Normal range of motion. Neck supple. Carotid bruit is not present.  Cardiovascular: Normal rate, regular rhythm, normal heart sounds and intact distal pulses.  Exam reveals no gallop and no friction rub.   No murmur heard. Pulmonary/Chest: Effort normal and breath sounds normal.  Musculoskeletal: Normal range of motion. She exhibits no edema.  Neurological: She is alert and oriented to person, place, and time.  Skin: Skin is warm and dry.  Psychiatric: She has a normal mood and affect. Her behavior is normal. Judgment and thought content normal.  Vitals reviewed.  BP 124/78 mmHg  Pulse 96  Temp(Src) 98.8 F (37.1 C) (Oral)  Resp 16  Ht '5\' 6"'$   (1.676 m)  Wt 170 lb (77.111 kg)  BMI 27.45 kg/m2  SpO2 98%     Assessment & Plan:  Discussed with Dr. Everlene Farrier 1. Hypothyroidism, unspecified hypothyroidism type - TSH  2. Renal cancer, unspecified laterality - Comprehensive metabolic panel  3. Pre-op evaluation - CBC - Comprehensive metabolic panel  4. Screening for hematuria or proteinuria - POCT urinalysis dipstick- patient unable to void   5. Need for prophylactic vaccination against Streptococcus pneumoniae (pneumococcus) - Pneumococcal conjugate vaccine 13-valent IM  - follow up in 4 months  - Cleared for surgery pending cardiology clearance. She has an appointment with Dr. Einar Gip this afternoon.   Clarene Reamer, FNP-BC  [facility]  04/27/2015 2:07 PM

## 2015-04-27 NOTE — Patient Instructions (Signed)
You have an appointment with Dr. Einar Gip at 3 pm today

## 2015-04-28 ENCOUNTER — Telehealth: Payer: Self-pay | Admitting: *Deleted

## 2015-04-28 LAB — TSH: TSH: 0.36 u[IU]/mL (ref 0.350–4.500)

## 2015-04-28 NOTE — Telephone Encounter (Signed)
Faxed request to [facility] for results of most recent mammo.  Will update health maintenance once received.

## 2015-05-06 NOTE — Telephone Encounter (Signed)
Received faxed mammo report dated 11/09/2014 - no mammographic evidence of malignancy.  Updated health maintenance, abstracted, sent to PCP for review and then to be scanned into EMR.

## 2015-05-26 ENCOUNTER — Telehealth: Payer: Self-pay

## 2015-05-26 ENCOUNTER — Other Ambulatory Visit: Payer: Self-pay | Admitting: Emergency Medicine

## 2015-05-26 MED ORDER — CICLOPIROX 8 % EX SOLN: Freq: Every day | CUTANEOUS | Status: DC

## 2015-05-26 NOTE — Telephone Encounter (Signed)
Pt states she have another fungus growing under her thumb and would like to have Dr Everlene Farrier to call her something in. She has had it before. Please call [place]

## 2015-05-26 NOTE — Telephone Encounter (Signed)
Call patient and tell her I sent a topical medication to the pharmacy.

## 2015-05-27 NOTE — Telephone Encounter (Signed)
Called in Rx and notified pt on VM. 

## 2015-05-31 ENCOUNTER — Other Ambulatory Visit: Payer: Self-pay | Admitting: Emergency Medicine

## 2015-06-01 ENCOUNTER — Encounter (HOSPITAL_COMMUNITY): Admission: RE | Payer: Self-pay | Source: Ambulatory Visit

## 2015-06-01 ENCOUNTER — Inpatient Hospital Stay (HOSPITAL_COMMUNITY)
Admission: RE | Admit: 2015-06-01 | Payer: PRIVATE HEALTH INSURANCE | Source: Ambulatory Visit | Admitting: Orthopedic Surgery

## 2015-06-01 SURGERY — ARTHROPLASTY, KNEE, TOTAL
Anesthesia: Spinal | Laterality: Left

## 2015-07-16 ENCOUNTER — Telehealth: Payer: Self-pay

## 2015-07-16 NOTE — Telephone Encounter (Signed)
Pt called again to request a refill of Xanax.

## 2015-07-16 NOTE — Telephone Encounter (Signed)
Patient requested a refill for Xanax two days ago and the request was never put in. Pharmacy is [facility]

## 2015-07-17 ENCOUNTER — Other Ambulatory Visit: Payer: Self-pay | Admitting: Emergency Medicine

## 2015-07-17 MED ORDER — ALPRAZOLAM 0.5 MG PO TABS: ORAL_TABLET | ORAL | Status: DC

## 2015-07-17 NOTE — Telephone Encounter (Signed)
Out of Xanax for two days.   [facility]  [phone]

## 2015-07-17 NOTE — Telephone Encounter (Signed)
Rx faxed to pharmacy and pt notified

## 2015-07-27 ENCOUNTER — Encounter: Payer: Self-pay | Admitting: Emergency Medicine

## 2015-07-27 NOTE — Patient Instructions (Addendum)
Panhia R Broerman  07/27/2015   Your procedure is scheduled on: 08/02/2015    Report to [facility] Main  Entrance take [facility]  elevators to 3rd floor to  [place] at   0900 AM.  Call this number if you have problems the morning of surgery [phone]   Remember: ONLY 1 PERSON MAY GO WITH YOU TO SHORT STAY TO GET  READY MORNING OF [place].  Do not eat food or drink liquids :After Midnight.     Take these medicines the morning of surgery with A SIP OF WATER:  Xanax if needed, Astelin nasal spray, Wellbutrin, Synthroid, , Proair Inhaler if needed and bring , MS contin if needed or oxycodone if needed,                                You may not have any metal on your body including hair pins and              piercings  Do not wear jewelry, make-up, lotions, powders or perfumes, deodorant             Do not wear nail polish.  Do not shave  48 hours prior to surgery.                 Do not bring valuables to the hospital. [place].  Contacts, dentures or bridgework may not be worn into surgery.  Leave suitcase in the car. After surgery it may be brought to your room.       Special Instructions: coughing and deep breathing exercises, leg exercises              Please read over the following fact sheets you were given: _____________________________________________________________________             [facility] - Preparing for Surgery Before surgery, you can play an important role.  Because skin is not sterile, your skin needs to be as free of germs as possible.  You can reduce the number of germs on your skin by washing with CHG (chlorahexidine gluconate) soap before surgery.  CHG is an antiseptic cleaner which kills germs and bonds with the skin to continue killing germs even after washing. Please DO NOT use if you have an allergy to CHG or antibacterial soaps.  If your skin becomes reddened/irritated  stop using the CHG and inform your nurse when you arrive at Short Stay. Do not shave (including legs and underarms) for at least 48 hours prior to the first CHG shower.  You may shave your face/neck. Please follow these instructions carefully:  1.  Shower with CHG Soap the night before surgery and the  morning of Surgery.  2.  If you choose to wash your hair, wash your hair first as usual with your  normal  shampoo.  3.  After you shampoo, rinse your hair and body thoroughly to remove the  shampoo.                           4.  Use CHG as you would any other liquid soap.  You can apply chg directly  to the skin and wash  Gently with a scrungie or clean washcloth.  5.  Apply the CHG Soap to your body ONLY FROM THE NECK DOWN.   Do not use on face/ open                           Wound or open sores. Avoid contact with eyes, ears mouth and genitals (private parts).                       Wash face,  Genitals (private parts) with your normal soap.             6.  Wash thoroughly, paying special attention to the area where your surgery  will be performed.  7.  Thoroughly rinse your body with warm water from the neck down.  8.  DO NOT shower/wash with your normal soap after using and rinsing off  the CHG Soap.                9.  Pat yourself dry with a clean towel.            10.  Wear clean pajamas.            11.  Place clean sheets on your bed the night of your first shower and do not  sleep with pets. Day of Surgery : Do not apply any lotions/deodorants the morning of surgery.  Please wear clean clothes to the hospital/surgery center.  FAILURE TO FOLLOW THESE INSTRUCTIONS MAY RESULT IN THE CANCELLATION OF YOUR SURGERY PATIENT SIGNATURE_________________________________  NURSE SIGNATURE__________________________________  ________________________________________________________________________  WHAT IS A BLOOD TRANSFUSION? Blood Transfusion Information  A transfusion is the  replacement of blood or some of its parts. Blood is made up of multiple cells which provide different functions.  Red blood cells carry oxygen and are used for blood loss replacement.  White blood cells fight against infection.  Platelets control bleeding.  Plasma helps clot blood.  Other blood products are available for specialized needs, such as hemophilia or other clotting disorders. BEFORE THE TRANSFUSION  Who gives blood for transfusions?   Healthy volunteers who are fully evaluated to make sure their blood is safe. This is blood bank blood. Transfusion therapy is the safest it has ever been in the practice of medicine. Before blood is taken from a donor, a complete history is taken to make sure that person has no history of diseases nor engages in risky social behavior (examples are intravenous drug use or sexual activity with multiple partners). The donor's travel history is screened to minimize risk of transmitting infections, such as malaria. The donated blood is tested for signs of infectious diseases, such as HIV and hepatitis. The blood is then tested to be sure it is compatible with you in order to minimize the chance of a transfusion reaction. If you or a relative donates blood, this is often done in anticipation of surgery and is not appropriate for emergency situations. It takes many days to process the donated blood. RISKS AND COMPLICATIONS Although transfusion therapy is very safe and saves many lives, the main dangers of transfusion include:  1. Getting an infectious disease. 2. Developing a transfusion reaction. This is an allergic reaction to something in the blood you were given. Every precaution is taken to prevent this. The decision to have a blood transfusion has been considered carefully by your caregiver before blood is given. Blood is not given unless the benefits outweigh  the risks. AFTER THE TRANSFUSION  Right after receiving a blood transfusion, you will usually  feel much better and more energetic. This is especially true if your red blood cells have gotten low (anemic). The transfusion raises the level of the red blood cells which carry oxygen, and this usually causes an energy increase.  The nurse administering the transfusion will monitor you carefully for complications. HOME CARE INSTRUCTIONS  No special instructions are needed after a transfusion. You may find your energy is better. Speak with your caregiver about any limitations on activity for underlying diseases you may have. SEEK MEDICAL CARE IF:   Your condition is not improving after your transfusion.  You develop redness or irritation at the intravenous (IV) site. SEEK IMMEDIATE MEDICAL CARE IF:  Any of the following symptoms occur over the next 12 hours:  Shaking chills.  You have a temperature by mouth above 102 F (38.9 C), not controlled by medicine.  Chest, back, or muscle pain.  People around you feel you are not acting correctly or are confused.  Shortness of breath or difficulty breathing.  Dizziness and fainting.  You get a rash or develop hives.  You have a decrease in urine output.  Your urine turns a dark color or changes to pink, red, or brown. Any of the following symptoms occur over the next 10 days:  You have a temperature by mouth above 102 F (38.9 C), not controlled by medicine.  Shortness of breath.  Weakness after normal activity.  The white part of the eye turns yellow (jaundice).  You have a decrease in the amount of urine or are urinating less often.  Your urine turns a dark color or changes to pink, red, or brown. Document Released: 10/06/2000 Document Revised: 01/01/2012 Document Reviewed: 05/25/2008 ExitCare Patient Information 2014 [place].  _______________________________________________________________________  Incentive Spirometer  An incentive spirometer is a tool that can help keep your lungs clear and active. This tool  measures how well you are filling your lungs with each breath. Taking long deep breaths may help reverse or decrease the chance of developing breathing (pulmonary) problems (especially infection) following:  A long period of time when you are unable to move or be active. BEFORE THE PROCEDURE   If the spirometer includes an indicator to show your best effort, your nurse or respiratory therapist will set it to a desired goal.  If possible, sit up straight or lean slightly forward. Try not to slouch.  Hold the incentive spirometer in an upright position. INSTRUCTIONS FOR USE  3. Sit on the edge of your bed if possible, or sit up as far as you can in bed or on a chair. 4. Hold the incentive spirometer in an upright position. 5. Breathe out normally. 6. Place the mouthpiece in your mouth and seal your lips tightly around it. 7. Breathe in slowly and as deeply as possible, raising the piston or the ball toward the top of the column. 8. Hold your breath for 3-5 seconds or for as long as possible. Allow the piston or ball to fall to the bottom of the column. 9. Remove the mouthpiece from your mouth and breathe out normally. 10. Rest for a few seconds and repeat Steps 1 through 7 at least 10 times every 1-2 hours when you are awake. Take your time and take a few normal breaths between deep breaths. 11. The spirometer may include an indicator to show your best effort. Use the indicator as a goal to work  toward during each repetition. 12. After each set of 10 deep breaths, practice coughing to be sure your lungs are clear. If you have an incision (the cut made at the time of surgery), support your incision when coughing by placing a pillow or rolled up towels firmly against it. Once you are able to get out of bed, walk around indoors and cough well. You may stop using the incentive spirometer when instructed by your caregiver.  RISKS AND COMPLICATIONS  Take your time so you do not get dizzy or  light-headed.  If you are in pain, you may need to take or ask for pain medication before doing incentive spirometry. It is harder to take a deep breath if you are having pain. AFTER USE  Rest and breathe slowly and easily.  It can be helpful to keep track of a log of your progress. Your caregiver can provide you with a simple table to help with this. If you are using the spirometer at home, follow these instructions: [place] IF:   You are having difficultly using the spirometer.  You have trouble using the spirometer as often as instructed.  Your pain medication is not giving enough relief while using the spirometer.  You develop fever of 100.5 F (38.1 C) or higher. SEEK IMMEDIATE MEDICAL CARE IF:   You cough up bloody sputum that had not been present before.  You develop fever of 102 F (38.9 C) or greater.  You develop worsening pain at or near the incision site. MAKE SURE YOU:   Understand these instructions.  Will watch your condition.  Will get help right away if you are not doing well or get worse. Document Released: 02/19/2007 Document Revised: 01/01/2012 Document Reviewed: 04/22/2007 [facility] Patient Information 2014 [place], [place].   ________________________________________________________________________

## 2015-07-28 ENCOUNTER — Encounter (HOSPITAL_COMMUNITY): Payer: Self-pay

## 2015-07-28 ENCOUNTER — Encounter (HOSPITAL_COMMUNITY)
Admission: RE | Admit: 2015-07-28 | Discharge: 2015-07-28 | Disposition: A | Discharge status: Home / Self Care | Payer: Medicare Other | Source: Ambulatory Visit | Attending: Orthopedic Surgery | Admitting: Orthopedic Surgery

## 2015-07-28 DIAGNOSIS — Z01812 Encounter for preprocedural laboratory examination: Secondary | ICD-10-CM | POA: Diagnosis present

## 2015-07-28 DIAGNOSIS — M179 Osteoarthritis of knee, unspecified: Secondary | ICD-10-CM

## 2015-07-28 DIAGNOSIS — Z01818 Encounter for other preprocedural examination: Secondary | ICD-10-CM

## 2015-07-28 DIAGNOSIS — Z79899 Other long term (current) drug therapy: Secondary | ICD-10-CM | POA: Diagnosis not present

## 2015-07-28 HISTORY — DX: Carpal tunnel syndrome, bilateral upper limbs: G56.03

## 2015-07-28 LAB — PROTIME-INR
INR: 1.05 (ref 0.00–1.49)
Prothrombin Time: 13.9 seconds (ref 11.6–15.2)

## 2015-07-28 LAB — APTT: aPTT: 30 seconds (ref 24–37)

## 2015-07-28 LAB — CBC
HCT: 39.2 % (ref 36.0–46.0)
Hemoglobin: 12.7 g/dL (ref 12.0–15.0)
MCH: 29.5 pg (ref 26.0–34.0)
MCHC: 32.4 g/dL (ref 30.0–36.0)
MCV: 91 fL (ref 78.0–100.0)
Platelets: 171 10*3/uL (ref 150–400)
RBC: 4.31 MIL/uL (ref 3.87–5.11)
RDW: 12.9 % (ref 11.5–15.5)
WBC: 5 10*3/uL (ref 4.0–10.5)

## 2015-07-28 LAB — BASIC METABOLIC PANEL
Anion gap: 7 (ref 5–15)
BUN: 32 mg/dL — ABNORMAL HIGH (ref 6–20)
CO2: 32 mmol/L (ref 22–32)
Calcium: 9.5 mg/dL (ref 8.9–10.3)
Chloride: 98 mmol/L — ABNORMAL LOW (ref 101–111)
Creatinine, Ser: 1.31 mg/dL — ABNORMAL HIGH (ref 0.44–1.00)
GFR calc Af Amer: 48 mL/min — ABNORMAL LOW (ref >60)
GFR calc non Af Amer: 42 mL/min — ABNORMAL LOW (ref >60)
Glucose, Bld: 89 mg/dL (ref 65–99)
Potassium: 3.9 mmol/L (ref 3.5–5.1)
Sodium: 137 mmol/L (ref 135–145)

## 2015-07-28 LAB — SURGICAL PCR SCREEN
MRSA, PCR: NEGATIVE
Staphylococcus aureus: POSITIVE — AB

## 2015-07-28 NOTE — Progress Notes (Signed)
BMP results done 07/28/2015 faxed via EPIC to Dr Alvan Dame.

## 2015-07-28 NOTE — Progress Notes (Signed)
Patient unable to void at time of preop appointment.  Patient sent home with wipes and urine cup with name sticker in biohazard bag.  Patient to bring on 07/29/2015 and she will call to let us know she is on her way.  Patient has phone number to call nurse at [phone].  Patient states she will bring in U/A on 07/29/15.

## 2015-07-28 NOTE — Progress Notes (Signed)
ECHO - 2014- EPIC  Stress 11/2012- EPIC  11/02/14- CT chest in EPIC  04/27/2015- EKG , LOV with Dr Einar Gip- 04/27/15, ECHO-10/20/13- and clearance 06/12/15 on chart

## 2015-07-29 ENCOUNTER — Other Ambulatory Visit (HOSPITAL_COMMUNITY)
Admission: RE | Admit: 2015-07-29 | Discharge: 2015-07-29 | Disposition: A | Discharge status: Home / Self Care | Payer: Medicare Other | Source: Ambulatory Visit | Attending: Orthopedic Surgery | Admitting: Orthopedic Surgery

## 2015-07-29 DIAGNOSIS — Z01812 Encounter for preprocedural laboratory examination: Secondary | ICD-10-CM | POA: Insufficient documentation

## 2015-07-29 DIAGNOSIS — Z79899 Other long term (current) drug therapy: Secondary | ICD-10-CM | POA: Insufficient documentation

## 2015-07-29 DIAGNOSIS — M179 Osteoarthritis of knee, unspecified: Secondary | ICD-10-CM | POA: Insufficient documentation

## 2015-07-29 LAB — URINALYSIS, ROUTINE W REFLEX MICROSCOPIC
Bilirubin Urine: NEGATIVE
Glucose, UA: NEGATIVE mg/dL
Hgb urine dipstick: NEGATIVE
Ketones, ur: NEGATIVE mg/dL
Nitrite: NEGATIVE
Protein, ur: NEGATIVE mg/dL
Specific Gravity, Urine: 1.018 (ref 1.005–1.030)
Urobilinogen, UA: 1 mg/dL (ref 0.0–1.0)
pH: 5.5 (ref 5.0–8.0)

## 2015-07-29 LAB — URINE MICROSCOPIC-ADD ON

## 2015-07-29 NOTE — Progress Notes (Signed)
U/A and micro results faxed via EPIC to Dr Alvan Dame.

## 2015-07-30 NOTE — H&P (Signed)
TOTAL KNEE ADMISSION H&P  Patient is being admitted for left total knee arthroplasty.  Subjective:  Chief Complaint:  Left knee primary OA / pain.  HPI: [name], 64 y.o. female, has a history of pain and functional disability in the left knee due to arthritis and has failed non-surgical conservative treatments for greater than 12 weeks to include NSAID's and/or analgesics, corticosteriod injections, viscosupplementation injections and activity modification.  Onset of symptoms was gradual, starting 1+ years ago with gradually worsening course since that time. The patient noted prior procedures on the knee to include  arthroplasty on the right knee in 2004 by Dr. Theda Sers.  Patient currently rates pain in the left knee(s) at 9 out of 10 with activity. Patient has night pain, worsening of pain with activity and weight bearing, pain that interferes with activities of daily living, pain with passive range of motion, crepitus and joint swelling.  Patient has evidence of periarticular osteophytes and joint space narrowing by imaging studies. There is no active infection.  Risks, benefits and expectations were discussed with the patient.  Risks including but not limited to the risk of anesthesia, blood clots, nerve damage, blood vessel damage, failure of the prosthesis, infection and up to and including death.  Patient understand the risks, benefits and expectations and wishes to proceed with surgery.   PCP: Jenny Reichmann, MD  D/C Plans:      Home with HHPT  Post-op Meds:       No Rx given   Tranexamic Acid:      To be given - IV  Decadron:      Is to be given  FYI:     ASA post-op  On Norco and Morphine pre-op  Needs 3-n-1, has a RW  HHPT with [place]     Patient Active Problem List   Diagnosis Date Noted  . Carpal tunnel syndrome 04/23/2015  . Pneumonia, organism unspecified 08/19/2013  . Autoimmune disease ([place]) 03/29/2013  . Apnea, sleep 03/29/2013  . Anxiety 11/28/2012  . Seizure  disorder ([place]) 11/28/2012  . Anxiety state 11/28/2012  . Chest pain at rest, rule out pericarditis 11/27/2012  . History of pericarditis, with pericardial window in 2009, and recurrent episodes 2011,2013, treated with methotrexate 11/27/2012  . SOB (shortness of breath) 11/27/2012  . Hypothyroidism 11/27/2012  . History of partial nephrectomy, both rt. and lt 11/27/2012  . Hx of ovarian cancer 11/27/2012  . Venous insufficiency 11/27/2012  . Chronic back pain, hx of lumbar spondylosis and stenosis L5-S1 with hx decompression and total diskectomy 11/27/2012  . Hx of melanoma of skin 11/27/2012  . 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 Chronic venous insufficiency 11/27/2012  . DIARRHEA-PRESUMED INFECTIOUS 12/27/2009   Past Medical History  Diagnosis Date  . Chest pain at rest, rule out pericarditis 11/27/2012  . History of pericarditis, with pericardial window in 2009 11/27/2012  . Hypothyroidism 11/27/2012  . Venous insufficiency 11/27/2012  . Hx of melanoma of skin 11/27/2012  . Fibromyalgia   . CHF (congestive heart failure) ([place])   . Chronic kidney disease   . Anxiety   . Heart murmur   . Complication of anesthesia     "woke up twice during knee replacement" (08/19/2013)  . Pneumonia     "several times; including today", RUL/notes 08/19/2013  . Iron deficiency anemia   . Anemia   . Arthritis     "everywhere" (08/19/2013)  . Chronic lower back pain   . Depression   .  Hx of ovarian cancer 11/27/2012  . Kidney carcinoma ([place])     "both kidneys; ~ 3 yr apart" (08/19/2013)  . Melanoma of back ([place])   . Squamous carcinoma ([place])     "face, side of my nose, chest; used acid to get rid of them" (08/19/2013)  . SOB (shortness of breath)     occasional with exertion   . Headache(784.0)     hx of migraines   . Migraines   . Bilateral carpal tunnel syndrome     Past Surgical History  Procedure Laterality Date  . Joint replacement    . Left oophorectomy Left 1979  . Lumbar disc  surgery  1984    "ruptured disc"  . Fracture surgery    . Doppler echocardiography  02/23/2011    LVEF>50%  . Partial nephrectomy Right 2005; ~ 2008  . Pericardial window  ~ 2012  . Tonsillectomy  1954  . Appendectomy    . Tubal ligation  1952  . Reduction mammaplasty Bilateral ~ 2008  . Shoulder arthroscopy w/ rotator cuff repair Right 1990's  . Total shoulder replacement Left 2006?  Marland Kitchen Total knee arthroplasty Right 1990's  . Knee arthroscopy Right     "twice before the replacement" (08/19/2013)  . Abdominal hysterectomy  1979  . Right oophorectomy  ~ 2007    "it had cancer in it" (08/19/2013)  . Posterior lumbar fusion  2002?; 03/2012  . Melanoma excision  ~ 2010    "my lower back" (08/19/2013)    No prescriptions prior to admission   Allergies  Allergen Reactions  . Amoxicillin     Has patient had a PCN reaction causing immediate rash, facial/tongue/throat swelling, SOB or lightheadedness with hypotension: No Has patient had a PCN reaction causing severe rash involving mucus membranes or skin necrosis: No Has patient had a PCN reaction that required hospitalization No Has patient had a PCN reaction occurring within the last 10 years: No If all of the above answers are "NO", then may proceed with Cephalosporin use.  . Ampicillin Rash    Has patient had a PCN reaction causing immediate rash, facial/tongue/throat swelling, SOB or lightheadedness with hypotension: No Has patient had a PCN reaction causing severe rash involving mucus membranes or skin necrosis: No Has patient had a PCN reaction that required hospitalization No Has patient had a PCN reaction occurring within the last 10 years: No If all of the above answers are "NO", then may proceed with Cephalosporin use.    Social History  Substance Use Topics  . Smoking status: Former Smoker -- 1.50 packs/day for 18 years    Types: Cigarettes    Quit date: 09/22/1985  . Smokeless tobacco: Never Used  . Alcohol Use: No     Family History  Problem Relation Age of Onset  . Cancer Mother   . Cancer Father   . Bipolar disorder Sister      Review of Systems  Constitutional: Negative.   Eyes: Negative.   Respiratory: Negative.   Cardiovascular: Negative.   Gastrointestinal: Negative.   Genitourinary: Negative.   Musculoskeletal: Positive for joint pain.  Skin: Negative.   Neurological: Positive for headaches.  Endo/Heme/Allergies: Negative.   Psychiatric/Behavioral: Positive for depression. The patient is nervous/anxious.     Objective:  Physical Exam  Constitutional: She is oriented to person, place, and time. She appears well-developed.  HENT:  Head: Normocephalic.  Eyes: Pupils are equal, round, and reactive to light.  Neck: Neck supple. No JVD present. No  tracheal deviation present. No thyromegaly present.  Cardiovascular: Normal rate, regular rhythm and intact distal pulses.   Respiratory: Effort normal and breath sounds normal. No respiratory distress. She has no wheezes.  GI: Soft. There is no tenderness. There is no guarding.  Musculoskeletal:       Left knee: She exhibits decreased range of motion, swelling, abnormal alignment and bony tenderness. She exhibits no ecchymosis, no deformity, no laceration and no erythema. Tenderness found.  Lymphadenopathy:    She has no cervical adenopathy.  Neurological: She is alert and oriented to person, place, and time.  Skin: Skin is warm and dry.  Psychiatric: She has a normal mood and affect.     Labs:  Estimated body mass index is 27.45 kg/(m^2) as calculated from the following:   Height as of 04/27/15: '5\' 6"'$  (1.676 m).   Weight as of 04/27/15: 77.111 kg (170 lb).   Imaging Review Plain radiographs demonstrate severe degenerative joint disease of the left knee(s). The overall alignment is mild valgus. The bone quality appears to be good for age and reported activity level.  Assessment/Plan:  End stage arthritis, left knee   The patient  history, physical examination, clinical judgment of the provider and imaging studies are consistent with end stage degenerative joint disease of the left knee(s) and total knee arthroplasty is deemed medically necessary. The treatment options including medical management, injection therapy arthroscopy and arthroplasty were discussed at length. The risks and benefits of total knee arthroplasty were presented and reviewed. The risks due to aseptic loosening, infection, stiffness, patella tracking problems, thromboembolic complications and other imponderables were discussed. The patient acknowledged the explanation, agreed to proceed with the plan and consent was signed. Patient is being admitted for inpatient treatment for surgery, pain control, PT, OT, prophylactic antibiotics, VTE prophylaxis, progressive ambulation and ADL's and discharge planning. The patient is planning to be discharged home with home health services.     West Pugh Terius Jacuinde   PA-C  07/30/2015, 9:54 AM

## 2015-08-02 ENCOUNTER — Encounter (HOSPITAL_COMMUNITY)
Admission: RE | Disposition: A | Discharge status: Home Health | Payer: Self-pay | Source: Ambulatory Visit | Attending: Orthopedic Surgery

## 2015-08-02 ENCOUNTER — Inpatient Hospital Stay (HOSPITAL_COMMUNITY): Payer: Medicare Other | Admitting: Anesthesiology

## 2015-08-02 ENCOUNTER — Inpatient Hospital Stay (HOSPITAL_COMMUNITY)
Admission: RE | Admit: 2015-08-02 | Discharge: 2015-08-03 | DRG: 470 | Disposition: A | Discharge status: Home Health | Payer: Medicare Other | Source: Ambulatory Visit | Attending: Orthopedic Surgery | Admitting: Orthopedic Surgery

## 2015-08-02 ENCOUNTER — Encounter (HOSPITAL_COMMUNITY): Payer: Self-pay

## 2015-08-02 DIAGNOSIS — M25462 Effusion, left knee: Secondary | ICD-10-CM | POA: Diagnosis present

## 2015-08-02 DIAGNOSIS — Z01812 Encounter for preprocedural laboratory examination: Secondary | ICD-10-CM

## 2015-08-02 DIAGNOSIS — Z96659 Presence of unspecified artificial knee joint: Secondary | ICD-10-CM

## 2015-08-02 DIAGNOSIS — Z85820 Personal history of malignant melanoma of skin: Secondary | ICD-10-CM

## 2015-08-02 DIAGNOSIS — Z96651 Presence of right artificial knee joint: Secondary | ICD-10-CM | POA: Diagnosis present

## 2015-08-02 DIAGNOSIS — Z809 Family history of malignant neoplasm, unspecified: Secondary | ICD-10-CM

## 2015-08-02 DIAGNOSIS — M659 Synovitis and tenosynovitis, unspecified: Secondary | ICD-10-CM | POA: Diagnosis present

## 2015-08-02 DIAGNOSIS — Z981 Arthrodesis status: Secondary | ICD-10-CM | POA: Diagnosis not present

## 2015-08-02 DIAGNOSIS — Z87891 Personal history of nicotine dependence: Secondary | ICD-10-CM

## 2015-08-02 DIAGNOSIS — I872 Venous insufficiency (chronic) (peripheral): Secondary | ICD-10-CM | POA: Diagnosis present

## 2015-08-02 DIAGNOSIS — E039 Hypothyroidism, unspecified: Secondary | ICD-10-CM | POA: Diagnosis present

## 2015-08-02 DIAGNOSIS — Z8543 Personal history of malignant neoplasm of ovary: Secondary | ICD-10-CM | POA: Diagnosis not present

## 2015-08-02 DIAGNOSIS — M1712 Unilateral primary osteoarthritis, left knee: Principal | ICD-10-CM | POA: Diagnosis present

## 2015-08-02 DIAGNOSIS — Z96652 Presence of left artificial knee joint: Secondary | ICD-10-CM

## 2015-08-02 DIAGNOSIS — M25562 Pain in left knee: Secondary | ICD-10-CM | POA: Diagnosis present

## 2015-08-02 DIAGNOSIS — F411 Generalized anxiety disorder: Secondary | ICD-10-CM | POA: Diagnosis present

## 2015-08-02 DIAGNOSIS — N189 Chronic kidney disease, unspecified: Secondary | ICD-10-CM | POA: Diagnosis present

## 2015-08-02 DIAGNOSIS — M25762 Osteophyte, left knee: Secondary | ICD-10-CM | POA: Diagnosis present

## 2015-08-02 DIAGNOSIS — G40909 Epilepsy, unspecified, not intractable, without status epilepticus: Secondary | ICD-10-CM | POA: Diagnosis present

## 2015-08-02 DIAGNOSIS — Z85528 Personal history of other malignant neoplasm of kidney: Secondary | ICD-10-CM | POA: Diagnosis not present

## 2015-08-02 DIAGNOSIS — M797 Fibromyalgia: Secondary | ICD-10-CM | POA: Diagnosis present

## 2015-08-02 DIAGNOSIS — G4730 Sleep apnea, unspecified: Secondary | ICD-10-CM | POA: Diagnosis present

## 2015-08-02 DIAGNOSIS — Z90710 Acquired absence of both cervix and uterus: Secondary | ICD-10-CM

## 2015-08-02 DIAGNOSIS — Z880 Allergy status to penicillin: Secondary | ICD-10-CM | POA: Diagnosis not present

## 2015-08-02 HISTORY — PX: TOTAL KNEE ARTHROPLASTY: SHX125

## 2015-08-02 LAB — TYPE AND SCREEN
ABO/RH(D): AB POS
Antibody Screen: NEGATIVE

## 2015-08-02 SURGERY — ARTHROPLASTY, KNEE, TOTAL
Anesthesia: General | Laterality: Left

## 2015-08-02 MED ORDER — PROMETHAZINE HCL 25 MG/ML IJ SOLN: 6.2500 mg | INTRAMUSCULAR | Status: DC | PRN

## 2015-08-02 MED ORDER — ONDANSETRON HCL 4 MG/2ML IJ SOLN: 4.0000 mg | Freq: Four times a day (QID) | INTRAMUSCULAR | Status: DC | PRN

## 2015-08-02 MED ORDER — ROPIVACAINE HCL 5 MG/ML IJ SOLN
INTRAMUSCULAR | Status: DC | PRN
  Administered 2015-08-02: 30 mL via PERINEURAL

## 2015-08-02 MED ORDER — METOCLOPRAMIDE HCL 5 MG/ML IJ SOLN: 5.0000 mg | Freq: Three times a day (TID) | INTRAMUSCULAR | Status: DC | PRN

## 2015-08-02 MED ORDER — FENTANYL CITRATE (PF) 250 MCG/5ML IJ SOLN
INTRAMUSCULAR | Status: DC | PRN
  Administered 2015-08-02 (×2): 25 ug via INTRAVENOUS
  Administered 2015-08-02: 50 ug via INTRAVENOUS

## 2015-08-02 MED ORDER — ALBUTEROL SULFATE (2.5 MG/3ML) 0.083% IN NEBU: 3.0000 mL | INHALATION_SOLUTION | RESPIRATORY_TRACT | Status: DC | PRN

## 2015-08-02 MED ORDER — DIPHENHYDRAMINE HCL 25 MG PO CAPS: 25.0000 mg | ORAL_CAPSULE | Freq: Four times a day (QID) | ORAL | Status: DC | PRN

## 2015-08-02 MED ORDER — POLYETHYLENE GLYCOL 3350 17 G PO PACK
17.0000 g | PACK | Freq: Two times a day (BID) | ORAL | Status: DC
Start: 2015-08-02 — End: 2015-08-03
  Administered 2015-08-02 – 2015-08-03 (×2): 17 g via ORAL

## 2015-08-02 MED ORDER — ROPIVACAINE HCL 5 MG/ML IJ SOLN
INTRAMUSCULAR | Status: AC
  Filled 2015-08-02: qty 30

## 2015-08-02 MED ORDER — MIDAZOLAM HCL 2 MG/2ML IJ SOLN
INTRAMUSCULAR | Status: AC
  Filled 2015-08-02: qty 4

## 2015-08-02 MED ORDER — BISACODYL 10 MG RE SUPP: 10.0000 mg | Freq: Every day | RECTAL | Status: DC | PRN

## 2015-08-02 MED ORDER — PROPOFOL 10 MG/ML IV BOLUS
INTRAVENOUS | Status: DC | PRN
  Administered 2015-08-02: 120 mg via INTRAVENOUS
  Administered 2015-08-02: 20 mg via INTRAVENOUS

## 2015-08-02 MED ORDER — SODIUM CHLORIDE 0.9 % IJ SOLN
INTRAMUSCULAR | Status: DC | PRN
  Administered 2015-08-02: 30 mL

## 2015-08-02 MED ORDER — DOCUSATE SODIUM 100 MG PO CAPS
100.0000 mg | ORAL_CAPSULE | Freq: Two times a day (BID) | ORAL | Status: DC
  Administered 2015-08-02 – 2015-08-03 (×2): 100 mg via ORAL

## 2015-08-02 MED ORDER — CHLORHEXIDINE GLUCONATE 4 % EX LIQD: 60.0000 mL | Freq: Once | CUTANEOUS | Status: DC

## 2015-08-02 MED ORDER — MAGNESIUM CITRATE PO SOLN: 1.0000 | Freq: Once | ORAL | Status: DC | PRN

## 2015-08-02 MED ORDER — CELECOXIB 200 MG PO CAPS
200.0000 mg | ORAL_CAPSULE | Freq: Two times a day (BID) | ORAL | Status: DC
  Administered 2015-08-02 – 2015-08-03 (×2): 200 mg via ORAL
  Filled 2015-08-02 (×4): qty 1

## 2015-08-02 MED ORDER — HYDROMORPHONE HCL 1 MG/ML IJ SOLN
INTRAMUSCULAR | Status: AC
  Filled 2015-08-02: qty 1

## 2015-08-02 MED ORDER — SODIUM CHLORIDE 0.9 % IR SOLN
Status: DC | PRN
  Administered 2015-08-02: 1000 mL

## 2015-08-02 MED ORDER — DEXAMETHASONE SODIUM PHOSPHATE 10 MG/ML IJ SOLN
INTRAMUSCULAR | Status: AC
  Filled 2015-08-02: qty 1

## 2015-08-02 MED ORDER — KETOROLAC TROMETHAMINE 30 MG/ML IJ SOLN
INTRAMUSCULAR | Status: AC
  Filled 2015-08-02: qty 1

## 2015-08-02 MED ORDER — CEFAZOLIN SODIUM-DEXTROSE 2-3 GM-% IV SOLR
2.0000 g | Freq: Four times a day (QID) | INTRAVENOUS | Status: AC
  Administered 2015-08-02 – 2015-08-03 (×2): 2 g via INTRAVENOUS
  Filled 2015-08-02 (×3): qty 50

## 2015-08-02 MED ORDER — ACETAMINOPHEN 10 MG/ML IV SOLN
1000.0000 mg | Freq: Once | INTRAVENOUS | Status: AC
  Administered 2015-08-02: 1000 mg via INTRAVENOUS

## 2015-08-02 MED ORDER — ZOLPIDEM TARTRATE 5 MG PO TABS
5.0000 mg | ORAL_TABLET | Freq: Every evening | ORAL | Status: DC | PRN
  Administered 2015-08-02: 5 mg via ORAL
  Filled 2015-08-02: qty 1

## 2015-08-02 MED ORDER — ALUM & MAG HYDROXIDE-SIMETH 200-200-20 MG/5ML PO SUSP: 30.0000 mL | ORAL | Status: DC | PRN

## 2015-08-02 MED ORDER — HYDROMORPHONE HCL 1 MG/ML IJ SOLN
INTRAMUSCULAR | Status: DC | PRN
  Administered 2015-08-02: .2 mg via INTRAVENOUS
  Administered 2015-08-02 (×2): .4 mg via INTRAVENOUS
  Administered 2015-08-02: .2 mg via INTRAVENOUS
  Administered 2015-08-02 (×2): .4 mg via INTRAVENOUS

## 2015-08-02 MED ORDER — METHOCARBAMOL 1000 MG/10ML IJ SOLN
500.0000 mg | Freq: Four times a day (QID) | INTRAVENOUS | Status: DC | PRN
  Administered 2015-08-02: 500 mg via INTRAVENOUS
  Filled 2015-08-02 (×2): qty 5

## 2015-08-02 MED ORDER — CEFAZOLIN SODIUM-DEXTROSE 2-3 GM-% IV SOLR
INTRAVENOUS | Status: AC
  Filled 2015-08-02: qty 50

## 2015-08-02 MED ORDER — ACETAMINOPHEN 10 MG/ML IV SOLN
INTRAVENOUS | Status: AC
Start: 2015-08-02 — End: 2015-08-03
  Filled 2015-08-02: qty 100

## 2015-08-02 MED ORDER — SUCCINYLCHOLINE CHLORIDE 20 MG/ML IJ SOLN
INTRAMUSCULAR | Status: DC | PRN
  Administered 2015-08-02: 100 mg via INTRAVENOUS

## 2015-08-02 MED ORDER — MORPHINE SULFATE ER 30 MG PO TBCR
30.0000 mg | EXTENDED_RELEASE_TABLET | Freq: Three times a day (TID) | ORAL | Status: DC
  Administered 2015-08-02 – 2015-08-03 (×3): 30 mg via ORAL
  Filled 2015-08-02 (×3): qty 1

## 2015-08-02 MED ORDER — HYDROMORPHONE HCL 1 MG/ML IJ SOLN
0.5000 mg | INTRAMUSCULAR | Status: DC | PRN
  Administered 2015-08-02 (×2): 0.5 mg via INTRAVENOUS
  Administered 2015-08-03 (×5): 1 mg via INTRAVENOUS
  Filled 2015-08-02 (×7): qty 1

## 2015-08-02 MED ORDER — ALPRAZOLAM 0.5 MG PO TABS
0.5000 mg | ORAL_TABLET | Freq: Three times a day (TID) | ORAL | Status: DC | PRN
  Administered 2015-08-02 – 2015-08-03 (×3): 0.5 mg via ORAL
  Filled 2015-08-02 (×3): qty 1

## 2015-08-02 MED ORDER — DEXAMETHASONE SODIUM PHOSPHATE 10 MG/ML IJ SOLN
10.0000 mg | Freq: Once | INTRAMUSCULAR | Status: AC
  Administered 2015-08-02: 10 mg via INTRAVENOUS

## 2015-08-02 MED ORDER — CEFAZOLIN SODIUM-DEXTROSE 2-3 GM-% IV SOLR
2.0000 g | INTRAVENOUS | Status: AC
  Administered 2015-08-02: 2 g via INTRAVENOUS

## 2015-08-02 MED ORDER — SODIUM CHLORIDE 0.9 % IJ SOLN
INTRAMUSCULAR | Status: AC
  Filled 2015-08-02: qty 50

## 2015-08-02 MED ORDER — PROPOFOL 10 MG/ML IV BOLUS
INTRAVENOUS | Status: AC
  Filled 2015-08-02: qty 20

## 2015-08-02 MED ORDER — TRANEXAMIC ACID 1000 MG/10ML IV SOLN
1000.0000 mg | Freq: Once | INTRAVENOUS | Status: AC
  Administered 2015-08-02: 1000 mg via INTRAVENOUS
  Filled 2015-08-02: qty 10

## 2015-08-02 MED ORDER — POTASSIUM CHLORIDE 2 MEQ/ML IV SOLN
INTRAVENOUS | Status: DC
  Administered 2015-08-02 – 2015-08-03 (×2): via INTRAVENOUS
  Filled 2015-08-02 (×6): qty 1000

## 2015-08-02 MED ORDER — LACTATED RINGERS IV SOLN
INTRAVENOUS | Status: DC
  Administered 2015-08-02: 14:00:00 via INTRAVENOUS
  Administered 2015-08-02: 1000 mL via INTRAVENOUS

## 2015-08-02 MED ORDER — ONDANSETRON HCL 4 MG/2ML IJ SOLN
INTRAMUSCULAR | Status: AC
  Filled 2015-08-02: qty 2

## 2015-08-02 MED ORDER — ONDANSETRON HCL 4 MG/2ML IJ SOLN
INTRAMUSCULAR | Status: DC | PRN
  Administered 2015-08-02: 4 mg via INTRAVENOUS

## 2015-08-02 MED ORDER — ONDANSETRON HCL 4 MG PO TABS
4.0000 mg | ORAL_TABLET | Freq: Four times a day (QID) | ORAL | Status: DC | PRN
Start: 2015-08-02 — End: 2015-08-03

## 2015-08-02 MED ORDER — FENTANYL CITRATE (PF) 100 MCG/2ML IJ SOLN
INTRAMUSCULAR | Status: AC
  Filled 2015-08-02: qty 4

## 2015-08-02 MED ORDER — KETOROLAC TROMETHAMINE 30 MG/ML IJ SOLN
INTRAMUSCULAR | Status: DC | PRN
  Administered 2015-08-02: 30 mg

## 2015-08-02 MED ORDER — BUPROPION HCL ER (SR) 150 MG PO TB12
150.0000 mg | ORAL_TABLET | Freq: Every day | ORAL | Status: DC
  Administered 2015-08-03: 150 mg via ORAL
  Filled 2015-08-02: qty 1

## 2015-08-02 MED ORDER — LEVOTHYROXINE SODIUM 100 MCG PO TABS
100.0000 ug | ORAL_TABLET | Freq: Every day | ORAL | Status: DC
  Administered 2015-08-03: 100 ug via ORAL
  Filled 2015-08-02 (×2): qty 1

## 2015-08-02 MED ORDER — FERROUS SULFATE 325 (65 FE) MG PO TABS
325.0000 mg | ORAL_TABLET | Freq: Three times a day (TID) | ORAL | Status: DC
  Administered 2015-08-03 (×2): 325 mg via ORAL
  Filled 2015-08-02 (×5): qty 1

## 2015-08-02 MED ORDER — OXYCODONE HCL 5 MG PO TABS
10.0000 mg | ORAL_TABLET | ORAL | Status: DC
  Administered 2015-08-02: 20 mg via ORAL
  Administered 2015-08-02 (×2): 10 mg via ORAL
  Administered 2015-08-03 (×4): 20 mg via ORAL
  Filled 2015-08-02: qty 4
  Filled 2015-08-02 (×2): qty 2
  Filled 2015-08-02: qty 4
  Filled 2015-08-02 (×2): qty 2
  Filled 2015-08-02 (×2): qty 4

## 2015-08-02 MED ORDER — DEXAMETHASONE SODIUM PHOSPHATE 10 MG/ML IJ SOLN
10.0000 mg | Freq: Once | INTRAMUSCULAR | Status: AC
  Administered 2015-08-03: 10 mg via INTRAVENOUS
  Filled 2015-08-02 (×2): qty 1

## 2015-08-02 MED ORDER — METHOCARBAMOL 500 MG PO TABS
500.0000 mg | ORAL_TABLET | Freq: Four times a day (QID) | ORAL | Status: DC | PRN
  Administered 2015-08-02 – 2015-08-03 (×3): 500 mg via ORAL
  Filled 2015-08-02 (×3): qty 1

## 2015-08-02 MED ORDER — MENTHOL 3 MG MT LOZG: 1.0000 | LOZENGE | OROMUCOSAL | Status: DC | PRN

## 2015-08-02 MED ORDER — HYDROMORPHONE HCL 1 MG/ML IJ SOLN
0.2500 mg | INTRAMUSCULAR | Status: DC | PRN
Start: 2015-08-02 — End: 2015-08-02
  Administered 2015-08-02 (×4): 0.5 mg via INTRAVENOUS

## 2015-08-02 MED ORDER — AZELASTINE HCL 0.1 % NA SOLN
2.0000 | Freq: Two times a day (BID) | NASAL | Status: DC
  Administered 2015-08-02 – 2015-08-03 (×2): 2 via NASAL
  Filled 2015-08-02: qty 30

## 2015-08-02 MED ORDER — HYDROMORPHONE HCL 2 MG/ML IJ SOLN
INTRAMUSCULAR | Status: AC
  Filled 2015-08-02: qty 1

## 2015-08-02 MED ORDER — BUPIVACAINE-EPINEPHRINE (PF) 0.25% -1:200000 IJ SOLN
INTRAMUSCULAR | Status: DC | PRN
  Administered 2015-08-02: 30 mL

## 2015-08-02 MED ORDER — PHENOL 1.4 % MT LIQD
1.0000 | OROMUCOSAL | Status: DC | PRN
  Filled 2015-08-02: qty 177

## 2015-08-02 MED ORDER — ASPIRIN EC 325 MG PO TBEC
325.0000 mg | DELAYED_RELEASE_TABLET | Freq: Two times a day (BID) | ORAL | Status: DC
  Administered 2015-08-03: 325 mg via ORAL
  Filled 2015-08-02 (×3): qty 1

## 2015-08-02 MED ORDER — METOCLOPRAMIDE HCL 10 MG PO TABS: 5.0000 mg | ORAL_TABLET | Freq: Three times a day (TID) | ORAL | Status: DC | PRN

## 2015-08-02 MED ORDER — HYDROMORPHONE HCL 1 MG/ML IJ SOLN
0.2500 mg | INTRAMUSCULAR | Status: DC | PRN
  Administered 2015-08-02 (×4): 0.5 mg via INTRAVENOUS

## 2015-08-02 MED ORDER — BUPIVACAINE-EPINEPHRINE (PF) 0.25% -1:200000 IJ SOLN
INTRAMUSCULAR | Status: AC
  Filled 2015-08-02: qty 30

## 2015-08-02 SURGICAL SUPPLY — 53 items
BAG DECANTER FOR FLEXI CONT (MISCELLANEOUS) IMPLANT
BAG ZIPLOCK 12X15 (MISCELLANEOUS) IMPLANT
BANDAGE ELASTIC 6 VELCRO ST LF (GAUZE/BANDAGES/DRESSINGS) ×3 IMPLANT
BANDAGE ESMARK 6X9 LF (GAUZE/BANDAGES/DRESSINGS) ×1 IMPLANT
BLADE SAW SGTL 13.0X1.19X90.0M (BLADE) ×3 IMPLANT
BNDG ESMARK 6X9 LF (GAUZE/BANDAGES/DRESSINGS) ×3
BOWL SMART MIX CTS (DISPOSABLE) ×3 IMPLANT
CAP KNEE TOTAL 3 SIGMA ×3 IMPLANT
CEMENT HV SMART SET (Cement) ×6 IMPLANT
CUFF TOURN SGL QUICK 34 (TOURNIQUET CUFF) ×2
CUFF TRNQT CYL 34X4X40X1 (TOURNIQUET CUFF) ×1 IMPLANT
DECANTER SPIKE VIAL GLASS SM (MISCELLANEOUS) ×3 IMPLANT
DRAPE EXTREMITY T 121X128X90 (DRAPE) ×3 IMPLANT
DRAPE POUCH INSTRU U-SHP 10X18 (DRAPES) ×3 IMPLANT
DRAPE U-SHAPE 47X51 STRL (DRAPES) ×3 IMPLANT
DRSG AQUACEL AG ADV 3.5X10 (GAUZE/BANDAGES/DRESSINGS) ×3 IMPLANT
DURAPREP 26ML APPLICATOR (WOUND CARE) ×6 IMPLANT
ELECT REM PT RETURN 9FT ADLT (ELECTROSURGICAL) ×3
ELECTRODE REM PT RTRN 9FT ADLT (ELECTROSURGICAL) ×1 IMPLANT
FACESHIELD WRAPAROUND (MASK) ×15 IMPLANT
GLOVE BIOGEL PI IND STRL 7.5 (GLOVE) ×9 IMPLANT
GLOVE BIOGEL PI IND STRL 8.5 (GLOVE) ×1 IMPLANT
GLOVE BIOGEL PI INDICATOR 7.5 (GLOVE) ×18
GLOVE BIOGEL PI INDICATOR 8.5 (GLOVE) ×2
GLOVE ECLIPSE 8.0 STRL XLNG CF (GLOVE) ×3 IMPLANT
GLOVE ORTHO TXT STRL SZ7.5 (GLOVE) ×6 IMPLANT
GOWN SPEC L3 XXLG W/TWL (GOWN DISPOSABLE) ×3 IMPLANT
GOWN STRL REUS W/TWL LRG LVL3 (GOWN DISPOSABLE) ×9 IMPLANT
HANDPIECE INTERPULSE COAX TIP (DISPOSABLE) ×2
KIT BASIN OR (CUSTOM PROCEDURE TRAY) ×3 IMPLANT
LIQUID BAND (GAUZE/BANDAGES/DRESSINGS) ×3 IMPLANT
MANIFOLD NEPTUNE II (INSTRUMENTS) ×3 IMPLANT
NDL SAFETY ECLIPSE 18X1.5 (NEEDLE) ×1 IMPLANT
NEEDLE HYPO 18GX1.5 SHARP (NEEDLE) ×2
PACK TOTAL JOINT (CUSTOM PROCEDURE TRAY) ×3 IMPLANT
PEN SKIN MARKING BROAD (MISCELLANEOUS) ×3 IMPLANT
POSITIONER SURGICAL ARM (MISCELLANEOUS) ×3 IMPLANT
SET HNDPC FAN SPRY TIP SCT (DISPOSABLE) ×1 IMPLANT
SET PAD KNEE POSITIONER (MISCELLANEOUS) ×3 IMPLANT
SUCTION FRAZIER 12FR DISP (SUCTIONS) ×3 IMPLANT
SUT MNCRL AB 4-0 PS2 18 (SUTURE) ×3 IMPLANT
SUT VIC AB 1 CT1 36 (SUTURE) ×3 IMPLANT
SUT VIC AB 2-0 CT1 27 (SUTURE) ×6
SUT VIC AB 2-0 CT1 TAPERPNT 27 (SUTURE) ×3 IMPLANT
SUT VLOC 180 0 24IN GS25 (SUTURE) ×3 IMPLANT
SYR 50ML LL SCALE MARK (SYRINGE) ×3 IMPLANT
TOWEL OR 17X26 10 PK STRL BLUE (TOWEL DISPOSABLE) ×3 IMPLANT
TOWEL OR NON WOVEN STRL DISP B (DISPOSABLE) ×3 IMPLANT
TRAY FOLEY W/METER SILVER 14FR (SET/KITS/TRAYS/PACK) ×3 IMPLANT
TRAY FOLEY W/METER SILVER 16FR (SET/KITS/TRAYS/PACK) IMPLANT
WATER STERILE IRR 1500ML POUR (IV SOLUTION) ×3 IMPLANT
WRAP KNEE MAXI GEL POST OP (GAUZE/BANDAGES/DRESSINGS) ×3 IMPLANT
YANKAUER SUCT BULB TIP 10FT TU (MISCELLANEOUS) ×3 IMPLANT

## 2015-08-02 NOTE — Anesthesia Procedure Notes (Addendum)
Anesthesia Regional Block:  Femoral nerve block  Pre-Anesthetic Checklist: ,, timeout performed, Correct Patient, Correct Site, Correct Laterality, Correct Procedure, Correct Position, site marked, Risks and benefits discussed,  Surgical consent,  Pre-op evaluation,  At surgeon's request and post-op pain management  Laterality: Lower and Left  Prep: chloraprep       Needles:  Injection technique: Single-shot  Needle Type: Stimiplex      Needle Gauge: 21 and 21 G    Additional Needles:  Procedures: ultrasound guided (picture in chart) and nerve stimulator Femoral nerve block  Nerve Stimulator or Paresthesia:  Response: quadriceps, 0.6 mA,   Additional Responses:   Narrative:  Injection made incrementally with aspirations every 5 mL.  Performed by: Personally  Anesthesiologist: Franne Grip  Additional Notes: No pain on injection. No increased resistance to injection. Motor intact immediately after block. Loss of quadriceps strength at 20 minutes. Meaningful verbal contact maintained throughout block placement.   Procedure Name: Intubation Date/Time: 08/02/2015 12:42 PM Performed by: Dione Booze Pre-anesthesia Checklist: Emergency Drugs available, Suction available, Patient being monitored and Patient identified Patient Re-evaluated:Patient Re-evaluated prior to inductionOxygen Delivery Method: Circle system utilized Preoxygenation: Pre-oxygenation with 100% oxygen Intubation Type: IV induction Laryngoscope Size: Mac and 3 Grade View: Grade I Tube type: Oral Tube size: 7.5 mm Number of attempts: 1 Airway Equipment and Method: Stylet Placement Confirmation: ETT inserted through vocal cords under direct vision,  breath sounds checked- equal and bilateral and positive ETCO2 Secured at: 20 cm Tube secured with: Tape Dental Injury: Teeth and Oropharynx as per pre-operative assessment

## 2015-08-02 NOTE — Anesthesia Preprocedure Evaluation (Addendum)
Anesthesia Evaluation  Patient identified by MRN, date of birth, ID band Patient awake    Reviewed: Allergy & Precautions, NPO status , Patient's Chart, lab work & pertinent test results  History of Anesthesia Complications (+) history of anesthetic complications  Airway Mallampati: II  TM Distance: >3 FB Neck ROM: Full    Dental no notable dental hx.    Pulmonary shortness of breath, sleep apnea , pneumonia, former smoker,    Pulmonary exam normal breath sounds clear to auscultation       Cardiovascular Exercise Tolerance: Good + Peripheral Vascular Disease and +CHF  Normal cardiovascular exam Rhythm:Regular Rate:Normal     Neuro/Psych  Headaches, PSYCHIATRIC DISORDERS Anxiety Depression  Neuromuscular disease    GI/Hepatic negative GI ROS, Neg liver ROS,   Endo/Other  Hypothyroidism   Renal/GU Renal disease  negative genitourinary   Musculoskeletal  (+) Arthritis , Fibromyalgia -  Abdominal   Peds negative pediatric ROS (+)  Hematology negative hematology ROS (+) anemia ,   Anesthesia Other Findings   Reproductive/Obstetrics negative OB ROS                            Anesthesia Physical Anesthesia Plan  ASA: III  Anesthesia Plan: General   Post-op Pain Management: GA combined w/ Regional for post-op pain   Induction: Intravenous  Airway Management Planned: Oral ETT  Additional Equipment:   Intra-op Plan:   Post-operative Plan: Extubation in OR  Informed Consent: I have reviewed the patients History and Physical, chart, labs and discussed the procedure including the risks, benefits and alternatives for the proposed anesthesia with the patient or authorized representative who has indicated his/her understanding and acceptance.   Dental advisory given  Plan Discussed with: CRNA  Anesthesia Plan Comments: (Posterior lumbar fusion L-2 to L-5.  Discussed r/b of general and  spinal anesthesia. With lumbar fusion, it may be difficult to perform spinal anesthesia. She prefers general with FNB.  Discussed risks of femoral nerve block including failure, bleeding, infection, nerve damage.  Femoral nerve block does not usually prevent all pain. Specifically, it treats the anterior, but often not the posterior knee. Questions answered.  Patient consents to block.  )      Anesthesia Quick Evaluation

## 2015-08-02 NOTE — Interval H&P Note (Signed)
History and Physical Interval Note:  08/02/2015 11:35 AM  [name]  has presented today for surgery, with the diagnosis of LEFT KNEE OA  The various methods of treatment have been discussed with the patient and family. After consideration of risks, benefits and other options for treatment, the patient has consented to  Procedure(s): LEFT TOTAL KNEE ARTHROPLASTY (Left) as a surgical intervention .  The patient's history has been reviewed, patient examined, no change in status, stable for surgery.  I have reviewed the patient's chart and labs.  Questions were answered to the patient's satisfaction.     Mauri Pole

## 2015-08-02 NOTE — Progress Notes (Signed)
AssistedDr. Delma Post with left, ultrasound guided, femoral block. Side rails up, monitors on throughout procedure. See vital signs in flow sheet. Tolerated Procedure well.

## 2015-08-02 NOTE — Plan of Care (Signed)
Problem: Consults Goal: Diagnosis- Total Joint Replacement Left total knee     

## 2015-08-02 NOTE — Op Note (Signed)
NAME:  [name]                      MEDICAL RECORD NO.:  [id number]                             FACILITY:  [facility]      PHYSICIAN:  Pietro Cassis. Alvan Dame, M.D.  DATE OF BIRTH:  1951/04/05      DATE OF PROCEDURE:  08/02/2015                                     OPERATIVE REPORT         PREOPERATIVE DIAGNOSIS:  Left knee osteoarthritis.      POSTOPERATIVE DIAGNOSIS:  Left knee osteoarthritis.      FINDINGS:  The patient was noted to have complete loss of cartilage and   bone-on-bone arthritis with associated osteophytes in the lateral and patellofemoral compartments of   the knee with a significant synovitis and associated effusion.      PROCEDURE:  Left total knee replacement.      COMPONENTS USED:  [employer] rotating platform posterior stabilized knee   system, a size 3 femur, 3 tibia, 10 mm PS insert, and 35 patellar   button.      SURGEON:  Pietro Cassis. Alvan Dame, M.D.      ASSISTANT:  Danae Orleans, PA-C.      ANESTHESIA:  General and Regional.      SPECIMENS:  None.      COMPLICATION:  None.      DRAINS:  None.  EBL: <50cc      TOURNIQUET TIME:   Total Tourniquet Time Documented: Thigh (Left) - 32 minutes Total: Thigh (Left) - 32 minutes      The patient was stable to the recovery room.      INDICATION FOR PROCEDURE:  [name] is a 64 y.o. female patient of   mine.  The patient had been seen, evaluated, and treated conservatively in the   office with medication, activity modification, and injections.  The patient had   radiographic changes of bone-on-bone arthritis with endplate sclerosis and osteophytes noted.      The patient failed conservative measures including medication, injections, and activity modification, and at this point was ready for more definitive measures.   Based on the radiographic changes and failed conservative measures, the patient   decided to proceed with total knee replacement.  Risks of infection,   DVT, component failure, need for  revision surgery, postop course, and   expectations were all   discussed and reviewed.  Consent was obtained for benefit of pain   relief.      PROCEDURE IN DETAIL:  The patient was brought to the operative theater.   Once adequate anesthesia, preoperative antibiotics, 2 gm of Ancef, 1 gm of Tranexamic Acid, and 10 mg of Decadron administered, the patient was positioned supine with the left thigh tourniquet placed.  The  left lower extremity was prepped and draped in sterile fashion.  A time-   out was performed identifying the patient, planned procedure, and   extremity.      The left lower extremity was placed in the [facility] leg holder.  The leg was   exsanguinated, tourniquet elevated to 250 mmHg.  A midline incision was   made followed by median  parapatellar arthrotomy.  Following initial   exposure, attention was first directed to the patella.  Precut   measurement was noted to be 22 mm.  I resected down to 13 mm and used a   35 patellar button to restore patellar height as well as cover the cut   surface.      The lug holes were drilled and a metal shim was placed to protect the   patella from retractors and saw blades.      At this point, attention was now directed to the femur.  The femoral   canal was opened with a drill, irrigated to try to prevent fat emboli.  An   intramedullary rod was passed at 3 degrees valgus, 10 mm of bone was   resected off the distal femur.  Following this resection, the tibia was   subluxated anteriorly.  Using the extramedullary guide, 4 mm of bone was resected off   the proximal lateral tibia.  We confirmed the gap would be   stable medially and laterally with a 10 mm insert as well as confirmed   the cut was perpendicular in the coronal plane, checking with an alignment rod.      Once this was done, I sized the femur to be a size 3 in the anterior-   posterior dimension, chose a standard component based on medial and   lateral dimension.  The  size 3 rotation block was then pinned in   position anterior referenced using the C-clamp to set rotation.  The   anterior, posterior, and  chamfer cuts were made without difficulty nor   notching making certain that I was along the anterior cortex to help   with flexion gap stability.      The final box cut was made off the lateral aspect of distal femur.      At this point, the tibia was sized to be a size 3, the size 3 tray was   then pinned in position through the medial third of the tubercle,   drilled, and keel punched.  Trial reduction was now carried with a 3 femur,  3 tibia, a size 10 mm insert, and the 35 patella botton.  The knee was brought to   extension, full extension with good flexion stability with the patella   tracking through the trochlea without application of pressure.  Given   all these findings, the trial components removed.  Final components were   opened and cement was mixed.  The knee was irrigated with normal saline   solution and pulse lavage.  The synovial lining was   then injected with 30cc of 0.25% Marcaine with epinephrine and 1 cc of Toradol plus 30cc of NS for a   total of 61 cc.      The knee was irrigated.  Final implants were then cemented onto clean and   dried cut surfaces of bone with the knee brought to extension with a 10 mm trial insert.      Once the cement had fully cured, the excess cement was removed   throughout the knee.  I confirmed I was satisfied with the range of   motion and stability, and the final 10 mm PS insert was chosen.  It was   placed into the knee.      The tourniquet had been let down at 32 minutes.  No significant   hemostasis required.  The   extensor mechanism was then reapproximated using #1  Vicryl and #0 V-lock sutures with the knee   in flexion.  The   remaining wound was closed with 2-0 Vicryl and running 4-0 Monocryl.   The knee was cleaned, dried, dressed sterilely using Dermabond and   Aquacel dressing.   The patient was then   brought to recovery room in stable condition, tolerating the procedure   well.   Please note that Physician Assistant, Danae Orleans, PA-C, was present for the entirety of the case, and was utilized for pre-operative positioning, peri-operative retractor management, general facilitation of the procedure.  He was also utilized for primary wound closure at the end of the case.              Pietro Cassis Alvan Dame, M.D.    08/02/2015 2:00 PM

## 2015-08-02 NOTE — Anesthesia Postprocedure Evaluation (Signed)
  Anesthesia Post-op Note  Patient: Sammy R Dubiel  Procedure(s) Performed: Procedure(s) (LRB): LEFT TOTAL KNEE ARTHROPLASTY (Left)  Patient Location: PACU  Anesthesia Type: GA combined with regional for post-op pain  Level of Consciousness: awake and alert   Airway and Oxygen Therapy: Patient Spontanous Breathing  Post-op Pain: mild  Post-op Assessment: Post-op Vital signs reviewed, Patient's Cardiovascular Status Stable, Respiratory Function Stable, Patent Airway and No signs of Nausea or vomiting. A lot of pain in PACU treated with increased levels of narcotics. Of note, she is on MS contin at home. Sleeping intermittently in PACU upon discharge from PACU.  Last Vitals:  Filed Vitals:   08/02/15 1537  BP: 143/72  Pulse: 82  Temp: 36.5 C  Resp: 17    Post-op Vital Signs: stable   Complications: No apparent anesthesia complications

## 2015-08-02 NOTE — Progress Notes (Signed)
Utilization review completed.  

## 2015-08-02 NOTE — Discharge Instructions (Signed)

## 2015-08-02 NOTE — Transfer of Care (Signed)
Immediate Anesthesia Transfer of Care Note  Patient: [name]  Procedure(s) Performed: Procedure(s): LEFT TOTAL KNEE ARTHROPLASTY (Left)  Patient Location: PACU  Anesthesia Type:General and Regional  Level of Consciousness: awake, alert , oriented and patient cooperative  Airway & Oxygen Therapy: Patient Spontanous Breathing and Patient connected to face mask oxygen  Post-op Assessment: Report given to RN and Post -op Vital signs reviewed and stable  Post vital signs: Reviewed and stable  Last Vitals:  Filed Vitals:   08/02/15 0909  BP: 130/60  Pulse: 78  Temp: 36.5 C  Resp: 18    Complications: No apparent anesthesia complications

## 2015-08-03 ENCOUNTER — Encounter: Payer: Self-pay | Admitting: Emergency Medicine

## 2015-08-03 LAB — BASIC METABOLIC PANEL
Anion gap: 6 (ref 5–15)
BUN: 31 mg/dL — ABNORMAL HIGH (ref 6–20)
CO2: 28 mmol/L (ref 22–32)
Calcium: 8.8 mg/dL — ABNORMAL LOW (ref 8.9–10.3)
Chloride: 105 mmol/L (ref 101–111)
Creatinine, Ser: 1.11 mg/dL — ABNORMAL HIGH (ref 0.44–1.00)
GFR calc Af Amer: 59 mL/min — ABNORMAL LOW (ref >60)
GFR calc non Af Amer: 51 mL/min — ABNORMAL LOW (ref >60)
Glucose, Bld: 149 mg/dL — ABNORMAL HIGH (ref 65–99)
Potassium: 4.2 mmol/L (ref 3.5–5.1)
Sodium: 139 mmol/L (ref 135–145)

## 2015-08-03 LAB — CBC
HCT: 31.6 % — ABNORMAL LOW (ref 36.0–46.0)
Hemoglobin: 10.5 g/dL — ABNORMAL LOW (ref 12.0–15.0)
MCH: 29.9 pg (ref 26.0–34.0)
MCHC: 33.2 g/dL (ref 30.0–36.0)
MCV: 90 fL (ref 78.0–100.0)
Platelets: 155 10*3/uL (ref 150–400)
RBC: 3.51 MIL/uL — ABNORMAL LOW (ref 3.87–5.11)
RDW: 13.2 % (ref 11.5–15.5)
WBC: 8.9 10*3/uL (ref 4.0–10.5)

## 2015-08-03 MED ORDER — POLYETHYLENE GLYCOL 3350 17 G PO PACK: 17.0000 g | PACK | Freq: Two times a day (BID) | ORAL | Status: DC

## 2015-08-03 MED ORDER — DOCUSATE SODIUM 100 MG PO CAPS: 100.0000 mg | ORAL_CAPSULE | Freq: Two times a day (BID) | ORAL | Status: DC

## 2015-08-03 MED ORDER — OXYCODONE HCL 10 MG PO TABS: 10.0000 mg | ORAL_TABLET | ORAL | Status: DC | PRN

## 2015-08-03 MED ORDER — ACETAMINOPHEN 325 MG PO TABS: 650.0000 mg | ORAL_TABLET | Freq: Four times a day (QID) | ORAL | Status: DC | PRN

## 2015-08-03 MED ORDER — CYCLOBENZAPRINE HCL 5 MG PO TABS: 5.0000 mg | ORAL_TABLET | Freq: Three times a day (TID) | ORAL | Status: DC | PRN

## 2015-08-03 MED ORDER — ASPIRIN 325 MG PO TBEC: 325.0000 mg | DELAYED_RELEASE_TABLET | Freq: Two times a day (BID) | ORAL | Status: AC

## 2015-08-03 MED ORDER — FERROUS SULFATE 325 (65 FE) MG PO TABS: 325.0000 mg | ORAL_TABLET | Freq: Three times a day (TID) | ORAL | Status: DC

## 2015-08-03 NOTE — Progress Notes (Signed)
Patient ID: [name], female   DOB: [birth year], 64 y.o.   MRN: [id number] Subjective: 1 Day Post-Op Procedure(s) (LRB): LEFT TOTAL KNEE ARTHROPLASTY (Left)    Patient reports pain as mild but has not done therapy yet.  Granddaughter in room.  Wants to go home if cleared with PT today  Objective:   VITALS:   Filed Vitals:   08/03/15 0620  BP: 113/52  Pulse: 68  Temp: 98.2 F (36.8 C)  Resp: 16    Neurovascular intact Incision: dressing C/D/I  LABS  Recent Labs  08/03/15 0515  HGB 10.5*  HCT 31.6*  WBC 8.9  PLT 155     Recent Labs  08/03/15 0515  NA 139  K 4.2  BUN 31*  CREATININE 1.11*  GLUCOSE 149*    No results for input(s): LABPT, INR in the last 72 hours.   Assessment/Plan: 1 Day Post-Op Procedure(s) (LRB): LEFT TOTAL KNEE ARTHROPLASTY (Left)   Advance diet Up with therapy Discharge home with home health if cleared by PT

## 2015-08-03 NOTE — Progress Notes (Signed)
Physical Therapy Treatment Note    08/03/15 1600  PT Visit Information  Last PT Received On 08/03/15  Assistance Needed +1  History of Present Illness Pt is a 64 year old female s/p L TKA with hx of R TKA, back surgery  PT Time Calculation  PT Start Time (ACUTE ONLY) 1430  PT Stop Time (ACUTE ONLY) 1441  PT Time Calculation (min) (ACUTE ONLY) 11 min  Subjective Data  Subjective Pt ambulated again in hallway and practiced safe stair technique.  Pt's granddaughter observed stairs.  Pt provided with stair handout (for spouse who was not present).  Pt wishes to d/c home today.  Precautions  Precautions Fall;Knee  Restrictions  Other Position/Activity Restrictions WBAT  Pain Assessment  Pain Assessment 0-10  Pain Score 4  Pain Location L knee  Pain Intervention(s) Monitored during session;Limited activity within patient's tolerance  Cognition  Arousal/Alertness Awake/alert  Behavior During Therapy WFL for tasks assessed/performed  Overall Cognitive Status Within Functional Limits for tasks assessed  Bed Mobility  General bed mobility comments Pt up in recliner  Transfers  Overall transfer level Needs assistance  Equipment used Rolling walker (2 wheeled)  Transfers Sit to/from Stand  Sit to Stand Min guard  General transfer comment verbal cues for safe technique  Ambulation/Gait  Ambulation/Gait assistance Min guard  Ambulation Distance (Feet) 80 Feet  Assistive device Rolling walker (2 wheeled)  Gait Pattern/deviations Antalgic;Decreased stance time - left;Step-through pattern;Decreased step length - right  General Gait Details verbal cues for safe technique, sequence, RW postioning, attempting more weight bearing on L LE using RW for UE support (again initially was not placing weight on L side)  Stairs Yes  Stairs assistance Min guard  Stair Management Backwards;With walker;Step to pattern  Number of Stairs 2  General stair comments verbal cues for sequence, safety,  technique, performed twice, granddaughter observed, provided with handout  PT - End of Session  Activity Tolerance Patient tolerated treatment well  Patient left Other (comment) (in bathroom, NT aware pt to use pull cord)  PT - Assessment/Plan  PT Plan Current plan remains appropriate  PT Frequency (ACUTE ONLY) 7X/week  Follow Up Recommendations Home health PT  PT equipment None recommended by PT  PT Goal Progression  Progress towards PT goals Progressing toward goals  PT General Charges  $$ ACUTE PT VISIT 1 Procedure  PT Treatments  $Gait Training 8-22 mins   Carmelia Bake, PT, DPT 08/03/2015 Pager: [phone]

## 2015-08-03 NOTE — Care Management Note (Signed)
Case Management Note  Patient Details  Name: [name] MRN: [id number] Date of Birth: 1951-08-26  Subjective/Objective:                   LEFT TOTAL KNEE ARTHROPLASTY (Left) Action/Plan:  Discharge planning Expected Discharge Date:  08/03/15               Expected Discharge Plan:  [place]  In-House Referral:     Discharge planning Services  CM Consult  Post Acute Care Choice:  Home Health Choice offered to:  Patient  DME Arranged:  3-N-1 DME Agency:  [place]:  PT [place] Agency:  [place]  Status of Service:  Completed, signed off  Medicare Important Message Given:    Date Medicare IM Given:    Medicare IM give by:    Date Additional Medicare IM Given:    Additional Medicare Important Message give by:     If discussed at [place] of Stay Meetings, dates discussed:    Additional Comments: CM met with pt in room to offer choice of home health agency.  Pt chooses [facility] to render HHPT.  Address and contact information verified by pt.  Pt has a rolling walker but needs a 3n1.  CM called [facility] DME rep, [name] to please deliver the 3n1 to room prior to pt discharge.  Referral given to [place], [name] (on unit).   NO other CM needs were communicated.   Dellie Catholic, RN 08/03/2015, 11:39 AM

## 2015-08-03 NOTE — Evaluation (Signed)
Physical Therapy Evaluation Patient Details Name: [name] MRN: [id number] DOB: 1951/08/16 Today's Date: 08/03/2015   History of Present Illness  Pt is a 64 year old female s/p L TKA with hx of R TKA, back surgery  Clinical Impression  Pt is s/p L TKA resulting in the deficits listed below (see PT Problem List).  Pt will benefit from skilled PT to increase their independence and safety with mobility to allow discharge to the venue listed below.  Pt ambulated 40 feet with RW and performed LE exercises.  Pt will need to practice steps prior to d/c (hopeful for later today).     Follow Up Recommendations Home health PT    Equipment Recommendations  None recommended by PT    Recommendations for Other Services       Precautions / Restrictions Precautions Precautions: Fall;Knee Restrictions Other Position/Activity Restrictions: WBAT      Mobility  Bed Mobility Overal bed mobility: Needs Assistance Bed Mobility: Supine to Sit     Supine to sit: Min guard;HOB elevated     General bed mobility comments: verbal cues for self assist  Transfers Overall transfer level: Needs assistance Equipment used: Rolling walker (2 wheeled) Transfers: Sit to/from Stand Sit to Stand: Min guard         General transfer comment: verbal cues for safe technique  Ambulation/Gait Ambulation/Gait assistance: Min guard Ambulation Distance (Feet): 40 Feet Assistive device: Rolling walker (2 wheeled) Gait Pattern/deviations: Step-to pattern;Step-through pattern;Decreased stride length;Decreased stance time - left;Decreased step length - right;Antalgic     General Gait Details: verbal cues for safe technique, sequence, RW postioning, attempting more weight bearing on L LE using RW for UE support (initially was not placing weight on L side)  Stairs            Wheelchair Mobility    Modified Rankin (Stroke Patients Only)       Balance                                              Pertinent Vitals/Pain Pain Assessment: 0-10 Pain Score: 5  Pain Location: L knee Pain Intervention(s): Limited activity within patient's tolerance;Repositioned;Ice applied;Monitored during session    Home Living Family/patient expects to be discharged to:: Private residence Living Arrangements: Spouse/significant other Available Help at Discharge: Family Type of Home: House Home Access: Stairs to enter   Technical brewer of Steps: 3 Home Layout: Able to live on main level with bedroom/bathroom Home Equipment: Walker - 2 wheels      Prior Function Level of Independence: Independent               Hand Dominance        Extremity/Trunk Assessment               Lower Extremity Assessment: LLE deficits/detail   LLE Deficits / Details: unable to perform SLR without lag, AAROM knee flexion 70*     Communication   Communication: No difficulties  Cognition Arousal/Alertness: Awake/alert Behavior During Therapy: WFL for tasks assessed/performed Overall Cognitive Status: Within Functional Limits for tasks assessed                      General Comments      Exercises Total Joint Exercises Ankle Circles/Pumps: AROM;Both;10 reps Quad Sets: 10 reps;Both;AROM Short Arc QuadSinclair Ship;Left;10 reps Heel Slides: AAROM;Left;10 reps  Hip ABduction/ADduction: AROM;Left;10 reps Straight Leg Raises: 10 reps;Left;AAROM      Assessment/Plan    PT Assessment Patient needs continued PT services  PT Diagnosis Difficulty walking;Acute pain   PT Problem List Decreased strength;Decreased range of motion;Decreased mobility;Decreased knowledge of precautions;Decreased knowledge of use of DME;Pain  PT Treatment Interventions Functional mobility training;DME instruction;Gait training;Stair training;Patient/family education;Therapeutic activities;Therapeutic exercise   PT Goals (Current goals can be found in the Care Plan section) Acute Rehab PT  Goals PT Goal Formulation: With patient Time For Goal Achievement: 08/07/15 Potential to Achieve Goals: Good    Frequency 7X/week   Barriers to discharge        Co-evaluation               End of Session Equipment Utilized During Treatment: Gait belt Activity Tolerance: Patient tolerated treatment well Patient left: in chair;with call bell/phone within reach;with family/visitor present;with nursing/sitter in room           Time: 2111-7356 PT Time Calculation (min) (ACUTE ONLY): 19 min   Charges:   PT Evaluation $Initial PT Evaluation Tier I: 1 Procedure     PT G Codes:        Lynda Capistran,KATHrine E 08/03/2015, 11:08 AM Carmelia Bake, PT, DPT 08/03/2015 Pager: [phone]

## 2015-08-03 NOTE — Progress Notes (Signed)
[facility] is providing the following services: patient received a commode 6/21//2012.  Her insurance will not cover another commode as of now.  She declined the order and stated that if she feels that she will in fact need it, she will call.  If patient discharges after hours, please call [phone].   Linward Headland 08/03/2015, 11:49 AM

## 2015-08-04 NOTE — Discharge Summary (Signed)
Physician Discharge Summary  Patient ID: [name] MRN: [id number] DOB/AGE: [birth year] 64 y.o.  Admit date: 08/02/2015 Discharge date: 08/03/2015   Procedures:  Procedure(s) (LRB): LEFT TOTAL KNEE ARTHROPLASTY (Left)  Attending Physician:  Dr. Paralee Cancel   Admission Diagnoses:   Left knee primary OA / pain  Discharge Diagnoses:  Principal Problem:   S/P left TKA Active Problems:   S/P knee replacement  Past Medical History  Diagnosis Date  . Chest pain at rest, rule out pericarditis 11/27/2012  . History of pericarditis, with pericardial window in 2009 11/27/2012  . Hypothyroidism 11/27/2012  . Venous insufficiency 11/27/2012  . Hx of melanoma of skin 11/27/2012  . Fibromyalgia   . CHF (congestive heart failure) ([place])   . Chronic kidney disease   . Anxiety   . Heart murmur   . Complication of anesthesia     "woke up twice during knee replacement" (08/19/2013)  . Pneumonia     "several times; including today", RUL/notes 08/19/2013  . Iron deficiency anemia   . Anemia   . Arthritis     "everywhere" (08/19/2013)  . Chronic lower back pain   . Depression   . Hx of ovarian cancer 11/27/2012  . Kidney carcinoma ([place])     "both kidneys; ~ 3 yr apart" (08/19/2013)  . Melanoma of back ([place])   . Squamous carcinoma ([place])     "face, side of my nose, chest; used acid to get rid of them" (08/19/2013)  . SOB (shortness of breath)     occasional with exertion   . Headache(784.0)     hx of migraines   . Migraines   . Bilateral carpal tunnel syndrome     HPI:    [name], 65 y.o. female, has a history of pain and functional disability in the left knee due to arthritis and has failed non-surgical conservative treatments for greater than 12 weeks to include NSAID's and/or analgesics, corticosteriod injections, viscosupplementation injections and activity modification. Onset of symptoms was gradual, starting 1+ years ago with gradually worsening course since that time. The  patient noted prior procedures on the knee to include arthroplasty on the right knee in 2004 by Dr. Theda Sers. Patient currently rates pain in the left knee(s) at 9 out of 10 with activity. Patient has night pain, worsening of pain with activity and weight bearing, pain that interferes with activities of daily living, pain with passive range of motion, crepitus and joint swelling. Patient has evidence of periarticular osteophytes and joint space narrowing by imaging studies. There is no active infection. Risks, benefits and expectations were discussed with the patient. Risks including but not limited to the risk of anesthesia, blood clots, nerve damage, blood vessel damage, failure of the prosthesis, infection and up to and including death. Patient understand the risks, benefits and expectations and wishes to proceed with surgery.   PCP: Jenny Reichmann, MD   Discharged Condition: good  Hospital Course:  Patient underwent the above stated procedure on 08/02/2015. Patient tolerated the procedure well and brought to the recovery room in good condition and subsequently to the floor.  POD #1 BP: 113/52 ; Pulse: 68 ; Temp: 98.2 F (36.8 C) ; Resp: 16 Patient reports pain as mild but has not done therapy yet. Granddaughter in room. Wants to go home if cleared with PT today. Dorsiflexion/plantar flexion intact, incision: dressing C/D/I, no cellulitis present and compartment soft.   LABS  Basename    HGB  10.5  HCT  31.6    Discharge Exam: General appearance: alert, cooperative and no distress Extremities: Homans sign is negative, no sign of DVT, no edema, redness or tenderness in the calves or thighs and no ulcers, gangrene or trophic changes  Disposition: Home with follow up in 2 weeks   Follow-up Information    Follow up with Mauri Pole, MD. Schedule an appointment as soon as possible for a visit in 2 weeks.   Specialty:  Orthopedic Surgery   Contact information:   [address] [place] [address] [phone]       Follow up with [facility].   Why:  home health physical therapy   Contact information:   [place]  [address] [phone]       Discharge Instructions    Call MD / Call 911    Complete by:  As directed   If you experience chest pain or shortness of breath, CALL 911 and be transported to the hospital emergency room.  If you develope a fever above 101 F, pus (white drainage) or increased drainage or redness at the wound, or calf pain, call your surgeon's office.     Change dressing    Complete by:  As directed   Maintain surgical dressing until follow up in the clinic. If the edges start to pull up, may reinforce with tape. If the dressing is no longer working, may remove and cover with gauze and tape, but must keep the area dry and clean.  Call with any questions or concerns.     Constipation Prevention    Complete by:  As directed   Drink plenty of fluids.  Prune juice may be helpful.  You may use a stool softener, such as Colace (over the counter) 100 mg twice a day.  Use MiraLax (over the counter) for constipation as needed.     Diet - low sodium heart healthy    Complete by:  As directed      Discharge instructions    Complete by:  As directed   Maintain surgical dressing until follow up in the clinic. If the edges start to pull up, may reinforce with tape. If the dressing is no longer working, may remove and cover with gauze and tape, but must keep the area dry and clean.  Follow up in 2 weeks at [facility]. Call with any questions or concerns.     Increase activity slowly as tolerated    Complete by:  As directed   Weight bearing as tolerated with assist device (walker, cane, etc) as directed, use it as long as suggested by your surgeon or therapist, typically at least 4-6 weeks.     TED hose    Complete by:  As directed   Use stockings (TED hose) for 2 weeks on both  leg(s).  You may remove them at night for sleeping.     Weight bearing as tolerated    Complete by:  As directed   Laterality:  left  Extremity:  Lower             Medication List    STOP taking these medications        diclofenac sodium 1 % Gel  Commonly known as:  VOLTAREN     oxyCODONE-acetaminophen 10-325 MG tablet  Commonly known as:  PERCOCET      TAKE these medications        acetaminophen 325 MG tablet  Commonly known as:  TYLENOL  Take 2 tablets (650 mg total) by mouth every 6 (six) hours as needed.     ALPRAZolam 0.5 MG tablet  Commonly known as:  XANAX  TAKE 1 TABLET BY MOUTH 3 TIMES DAILY AS NEEDED FOR ANXIETY     aspirin 325 MG EC tablet  Take 1 tablet (325 mg total) by mouth 2 (two) times daily.     azelastine 0.1 % nasal spray  Commonly known as:  ASTELIN  Place 2 sprays into both nostrils 2 (two) times daily. Use in each nostril as directed     BIOTIN 5000 PO  Take 1 tablet by mouth daily.     buPROPion 150 MG 12 hr tablet  Commonly known as:  WELLBUTRIN SR  TAKE 1 TABLET BY MOUTH DAILY     ciclopirox 8 % solution  Commonly known as:  PENLAC  Apply topically at bedtime. Apply over nail and surrounding skin. Apply daily over previous coat. After seven (7) days, may remove with alcohol and continue cycle.     cyclobenzaprine 5 MG tablet  Commonly known as:  FLEXERIL  Take 1 tablet (5 mg total) by mouth 3 (three) times daily as needed. For spasms     docusate sodium 100 MG capsule  Commonly known as:  COLACE  Take 1 capsule (100 mg total) by mouth 2 (two) times daily.     ferrous sulfate 325 (65 FE) MG tablet  Take 1 tablet (325 mg total) by mouth 3 (three) times daily after meals.     l-methylfolate-B6-B12 3-35-2 MG Tabs tablet  Commonly known as:  METANX  Take 1 tablet by mouth 2 (two) times daily.     levothyroxine 100 MCG tablet  Commonly known as:  SYNTHROID, LEVOTHROID  Take 1 tablet (100 mcg total) by mouth daily.     metoprolol  tartrate 25 MG tablet  Commonly known as:  LOPRESSOR  Take one half tablet twice a day     morphine 30 MG 12 hr tablet  Commonly known as:  MS CONTIN  Take 30 mg by mouth every 8 (eight) hours.     multivitamin with minerals Tabs tablet  Take 1 tablet by mouth daily.     Oxycodone HCl 10 MG Tabs  Take 1-2 tablets (10-20 mg total) by mouth every 4 (four) hours as needed for severe pain.     polyethylene glycol packet  Commonly known as:  MIRALAX / GLYCOLAX  Take 17 g by mouth 2 (two) times daily.     PROAIR HFA 108 (90 BASE) MCG/ACT inhaler  Generic drug:  albuterol  INHALE 2 PUFFS EVERY 4 HOURS AS NEEDED FOR WHEEZING, COUGH OR SHORTNESS OF BREATH     zolpidem 5 MG tablet  Commonly known as:  AMBIEN  TAKE 1 TABLET BY MOUTH AT BEDTIME AS NEEDED FOR SLEEP.         Signed: [place]. Micala Saltsman   PA-C  08/04/2015, 1:27 PM

## 2015-08-05 ENCOUNTER — Telehealth: Payer: Self-pay | Admitting: Radiology

## 2015-08-05 NOTE — Telephone Encounter (Signed)
There is a message in the box regarding this patient/ she has seen Dr Alvan Dame and has elevated kidney functions. She will see you on 09/02/15, FYI only

## 2015-08-31 ENCOUNTER — Ambulatory Visit: Payer: Medicare Other | Admitting: Emergency Medicine

## 2015-09-02 ENCOUNTER — Encounter: Payer: Self-pay | Admitting: Emergency Medicine

## 2015-09-02 ENCOUNTER — Ambulatory Visit (INDEPENDENT_AMBULATORY_CARE_PROVIDER_SITE_OTHER): Payer: Medicare Other | Admitting: Emergency Medicine

## 2015-09-02 VITALS — BP 120/58 | HR 94 | Temp 98.4°F | Resp 16 | Ht 65.75 in | Wt 166.2 lb

## 2015-09-02 DIAGNOSIS — N183 Chronic kidney disease, stage 3 unspecified: Secondary | ICD-10-CM

## 2015-09-02 DIAGNOSIS — L989 Disorder of the skin and subcutaneous tissue, unspecified: Secondary | ICD-10-CM

## 2015-09-02 DIAGNOSIS — Z23 Encounter for immunization: Secondary | ICD-10-CM

## 2015-09-02 DIAGNOSIS — R739 Hyperglycemia, unspecified: Secondary | ICD-10-CM

## 2015-09-02 LAB — BASIC METABOLIC PANEL WITH GFR
BUN: 41 mg/dL — ABNORMAL HIGH (ref 7–25)
CO2: 31 mmol/L (ref 20–31)
Calcium: 9.5 mg/dL (ref 8.6–10.4)
Chloride: 97 mmol/L — ABNORMAL LOW (ref 98–110)
Creat: 1.33 mg/dL — ABNORMAL HIGH (ref 0.50–0.99)
GFR, Est African American: 48 mL/min — ABNORMAL LOW (ref >=60)
GFR, Est Non African American: 42 mL/min — ABNORMAL LOW (ref >=60)
Glucose, Bld: 157 mg/dL — ABNORMAL HIGH (ref 65–99)
Potassium: 4 mmol/L (ref 3.5–5.3)
Sodium: 139 mmol/L (ref 135–146)

## 2015-09-02 LAB — HEMOGLOBIN A1C
Hgb A1c MFr Bld: 6 % — ABNORMAL HIGH (ref <5.7)
Mean Plasma Glucose: 126 mg/dL — ABNORMAL HIGH (ref <117)

## 2015-09-02 MED ORDER — ALPRAZOLAM 0.5 MG PO TABS: ORAL_TABLET | ORAL | Status: DC

## 2015-09-02 MED ORDER — MUPIROCIN 2 % EX OINT: TOPICAL_OINTMENT | CUTANEOUS | Status: DC

## 2015-09-02 NOTE — Progress Notes (Signed)
Subjective:  This chart was scribed for [name], MD by Tamsen Roers, at [facility].  This patient was seen in room 22 and the patient's care was started at 12:05 PM.    Patient ID: [name], female    DOB: [birth year], 64 y.o.   MRN: [id number] Chief Complaint  Patient presents with  . Follow-up    4 mos  . Hypothyroidism  . Pulmonary nodules  . Medication Refill    Xanax    HPI HPI Comments: [name] is a 64 y.o. female who presents to the [facility] for a follow up.  She is currently taking Wellbutrin (1 per day) and is wondering if she could increase her dosage.  Patient notes that she feels depressed at times and thinks its due to everything she has been through especially with her recent knee replacement. Patient is willing to come back in 4 months for another follow- up.  She would also like a refill for her Xanax today.   Knee replacement: Patient had her left knee replaced and states that she is still having constant pain.  She is currently attending physical therapy (2 times a week).    Wound/rash: She has a small wound on the back of her right leg and thinks that she may have cut it shaving. Patient thinks that it has been there for the past couple of weeks.   Cardiologist: Patient sees Dr. Einar Gip and states that there were no concerns.    Patient Active Problem List   Diagnosis Date Noted  . S/P left TKA 08/02/2015  . S/P knee replacement 08/02/2015  . Carpal tunnel syndrome 04/23/2015  . Pneumonia, organism unspecified 08/19/2013  . Autoimmune disease ([place]) 03/29/2013  . Apnea, sleep 03/29/2013  . Anxiety 11/28/2012  . Seizure disorder ([place]) 11/28/2012  . Anxiety state 11/28/2012  . Chest pain at rest, rule out pericarditis 11/27/2012  . History of pericarditis, with pericardial window in 2009, and recurrent episodes 2011,2013, treated with methotrexate 11/27/2012  . SOB (shortness of breath)  11/27/2012  . Hypothyroidism 11/27/2012  . History of partial nephrectomy, both rt. and lt 11/27/2012  . Hx of ovarian cancer 11/27/2012  . Venous insufficiency 11/27/2012  . Chronic back pain, hx of lumbar spondylosis and stenosis L5-S1 with hx decompression and total diskectomy 11/27/2012  . Hx of melanoma of skin 11/27/2012  . 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 Chronic venous insufficiency 11/27/2012  . DIARRHEA-PRESUMED INFECTIOUS 12/27/2009   Past Medical History  Diagnosis Date  . Chest pain at rest, rule out pericarditis 11/27/2012  . History of pericarditis, with pericardial window in 2009 11/27/2012  . Hypothyroidism 11/27/2012  . Venous insufficiency 11/27/2012  . Hx of melanoma of skin 11/27/2012  . Fibromyalgia   . CHF (congestive heart failure) ([place])   . Chronic kidney disease   . Anxiety   . Heart murmur   . Complication of anesthesia     "woke up twice during knee replacement" (08/19/2013)  . Pneumonia     "several times; including today", RUL/notes 08/19/2013  . Iron deficiency anemia   . Anemia   . Arthritis     "everywhere" (08/19/2013)  . Chronic lower back pain   . Depression   . Hx of ovarian cancer 11/27/2012  . Kidney carcinoma ([place])     "both kidneys; ~ 3 yr apart" (08/19/2013)  . Melanoma of back ([place])   . Squamous  carcinoma ([place])     "face, side of my nose, chest; used acid to get rid of them" (08/19/2013)  . SOB (shortness of breath)     occasional with exertion   . Headache(784.0)     hx of migraines   . Migraines   . Bilateral carpal tunnel syndrome    Past Surgical History  Procedure Laterality Date  . Joint replacement    . Left oophorectomy Left 1979  . Lumbar disc surgery  1984    "ruptured disc"  . Fracture surgery    . Doppler echocardiography  02/23/2011    LVEF>50%  . Partial nephrectomy Right 2005; ~ 2008  . Pericardial window  ~ 2012  . Tonsillectomy  1954  . Appendectomy    . Tubal ligation  1952  . Reduction  mammaplasty Bilateral ~ 2008  . Shoulder arthroscopy w/ rotator cuff repair Right 1990's  . Total shoulder replacement Left 2006?  Marland Kitchen Total knee arthroplasty Right 1990's  . Knee arthroscopy Right     "twice before the replacement" (08/19/2013)  . Abdominal hysterectomy  1979  . Right oophorectomy  ~ 2007    "it had cancer in it" (08/19/2013)  . Posterior lumbar fusion  2002?; 03/2012  . Melanoma excision  ~ 2010    "my lower back" (08/19/2013)  . Total knee arthroplasty Left 08/02/2015    Procedure: LEFT TOTAL KNEE ARTHROPLASTY;  Surgeon: Paralee Cancel, MD;  Location: [facility];  Service: Orthopedics;  Laterality: Left;   Allergies  Allergen Reactions  . Amoxicillin     Has patient had a PCN reaction causing immediate rash, facial/tongue/throat swelling, SOB or lightheadedness with hypotension: No Has patient had a PCN reaction causing severe rash involving mucus membranes or skin necrosis: No Has patient had a PCN reaction that required hospitalization No Has patient had a PCN reaction occurring within the last 10 years: No If all of the above answers are "NO", then may proceed with Cephalosporin use.  . Ampicillin Rash    Has patient had a PCN reaction causing immediate rash, facial/tongue/throat swelling, SOB or lightheadedness with hypotension: No Has patient had a PCN reaction causing severe rash involving mucus membranes or skin necrosis: No Has patient had a PCN reaction that required hospitalization No Has patient had a PCN reaction occurring within the last 10 years: No If all of the above answers are "NO", then may proceed with Cephalosporin use.   Prior to Admission medications   Medication Sig Start Date End Date Taking? Authorizing Provider  acetaminophen (TYLENOL) 325 MG tablet Take 2 tablets (650 mg total) by mouth every 6 (six) hours as needed. 08/03/15   Danae Orleans, PA-C  ALPRAZolam Duanne Moron) 0.5 MG tablet TAKE 1 TABLET BY MOUTH 3 TIMES DAILY AS NEEDED FOR ANXIETY  07/17/15   [name], MD  azelastine (ASTELIN) 0.1 % nasal spray Place 2 sprays into both nostrils 2 (two) times daily. Use in each nostril as directed 01/14/15   [name], MD  BIOTIN 5000 PO Take 1 tablet by mouth daily.    Historical Provider, MD  buPROPion (WELLBUTRIN SR) 150 MG 12 hr tablet TAKE 1 TABLET BY MOUTH DAILY 06/01/15   [name], MD  ciclopirox (PENLAC) 8 % solution Apply topically at bedtime. Apply over nail and surrounding skin. Apply daily over previous coat. After seven (7) days, may remove with alcohol and continue cycle. 05/26/15   [name], MD  cyclobenzaprine (FLEXERIL) 5 MG tablet Take 1  tablet (5 mg total) by mouth 3 (three) times daily as needed. For spasms 08/03/15   Danae Orleans, PA-C  docusate sodium (COLACE) 100 MG capsule Take 1 capsule (100 mg total) by mouth 2 (two) times daily. 08/03/15   Danae Orleans, PA-C  ferrous sulfate 325 (65 FE) MG tablet Take 1 tablet (325 mg total) by mouth 3 (three) times daily after meals. 08/03/15   Danae Orleans, PA-C  l-methylfolate-B6-B12 (METANX) 3-35-2 MG TABS tablet Take 1 tablet by mouth 2 (two) times daily.    Historical Provider, MD  levothyroxine (SYNTHROID, LEVOTHROID) 100 MCG tablet Take 1 tablet (100 mcg total) by mouth daily. 10/27/14   [name], MD  metoprolol tartrate (LOPRESSOR) 25 MG tablet Take one half tablet twice a day Patient not taking: Reported on 07/28/2015 10/27/14   [name], MD  morphine (MS CONTIN) 30 MG 12 hr tablet Take 30 mg by mouth every 8 (eight) hours.     Historical Provider, MD  Multiple Vitamin (MULTIVITAMIN WITH MINERALS) TABS Take 1 tablet by mouth daily.    Historical Provider, MD  oxyCODONE 10 MG TABS Take 1-2 tablets (10-20 mg total) by mouth every 4 (four) hours as needed for severe pain. 08/03/15   Danae Orleans, PA-C  polyethylene glycol (MIRALAX / GLYCOLAX) packet Take 17 g by mouth 2 (two) times daily. 08/03/15   Matthew Babish, PA-C  PROAIR HFA 108 (90 BASE) MCG/ACT  inhaler INHALE 2 PUFFS EVERY 4 HOURS AS NEEDED FOR WHEEZING, COUGH OR SHORTNESS OF BREATH    [name], MD  zolpidem (AMBIEN) 5 MG tablet TAKE 1 TABLET BY MOUTH AT BEDTIME AS NEEDED FOR SLEEP. 11/15/14   [name], MD   Social History   Social History  . Marital Status: Married    Spouse Name: N/A  . Number of Children: N/A  . Years of Education: N/A   Occupational History  . Not on file.   Social History Main Topics  . Smoking status: Former Smoker -- 1.50 packs/day for 18 years    Types: Cigarettes    Quit date: 09/22/1985  . Smokeless tobacco: Never Used  . Alcohol Use: No  . Drug Use: No     Comment: prescribed  . Sexual Activity: No   Other Topics Concern  . Not on file   Social History Narrative   Married.       Review of Systems  Constitutional: Negative for fever and chills.  Eyes: Negative for pain, redness and itching.  Gastrointestinal: Negative for nausea and vomiting.  Musculoskeletal: Positive for arthralgias. Negative for neck pain and neck stiffness.  Skin: Positive for rash and wound.       Objective:   Physical Exam Filed Vitals:   09/02/15 1127  BP: 120/58  Pulse: 94  Temp: 98.4 F (36.9 C)  TempSrc: Oral  Resp: 16  Height: 5' 5.75" (1.67 m)  Weight: 166 lb 3.2 oz (75.388 kg)  SpO2: 99%     CONSTITUTIONAL: Well developed/well nourished HEAD: Normocephalic/atraumatic EYES: EOMI/PERRL ENMT: Mucous membranes moist NECK: supple no meningeal signs SPINE/BACK:entire spine nontender CV: S1/S2 noted, no murmurs/rubs/gallops noted LUNGS: Lungs are clear to auscultation bilaterally, no apparent distress ABDOMEN: soft, nontender, no rebound or guarding, bowel sounds noted throughout abdomen GU:no cva tenderness NEURO: Pt is awake/alert/appropriate, moves all extremitiesx4.  No facial droop.   EXTREMITIES: pulses normal/equal, full ROM SKIN: right lower posterior leg is a 2 by 1 cm excoriated inflamed area.  PSYCH: no  abnormalities  of mood noted, alert and oriented to situation        Assessment & Plan:  Patient is doing well. She does seem somewhat depressed. Her knee replacement is doing well. She has been released regarding her pericardial effusion. I refilled her medications recheck 3 months. I did not go up on her Wellbutrin at the present time. I personally performed the services described in this documentation, which was scribed in my presence. The recorded information has been reviewed and is accurate.

## 2015-09-07 NOTE — Addendum Note (Signed)
Addended by: Constance Goltz on: 09/07/2015 08:11 AM   Modules accepted: Orders

## 2015-09-23 ENCOUNTER — Ambulatory Visit: Payer: Medicare Other | Admitting: Emergency Medicine

## 2015-10-20 ENCOUNTER — Other Ambulatory Visit: Payer: Self-pay | Admitting: Physician Assistant

## 2015-11-11 DIAGNOSIS — Z6828 Body mass index (BMI) 28.0-28.9, adult: Secondary | ICD-10-CM | POA: Diagnosis not present

## 2015-11-11 DIAGNOSIS — N958 Other specified menopausal and perimenopausal disorders: Secondary | ICD-10-CM | POA: Diagnosis not present

## 2015-11-11 DIAGNOSIS — Z1272 Encounter for screening for malignant neoplasm of vagina: Secondary | ICD-10-CM | POA: Diagnosis not present

## 2015-11-11 DIAGNOSIS — Z124 Encounter for screening for malignant neoplasm of cervix: Secondary | ICD-10-CM | POA: Diagnosis not present

## 2015-11-11 DIAGNOSIS — Z1231 Encounter for screening mammogram for malignant neoplasm of breast: Secondary | ICD-10-CM | POA: Diagnosis not present

## 2015-11-11 DIAGNOSIS — M816 Localized osteoporosis [Lequesne]: Secondary | ICD-10-CM | POA: Diagnosis not present

## 2015-11-11 DIAGNOSIS — Z90710 Acquired absence of both cervix and uterus: Secondary | ICD-10-CM | POA: Diagnosis not present

## 2015-11-12 DIAGNOSIS — M545 Low back pain: Secondary | ICD-10-CM | POA: Diagnosis not present

## 2015-11-12 DIAGNOSIS — M5416 Radiculopathy, lumbar region: Secondary | ICD-10-CM | POA: Diagnosis not present

## 2015-11-12 DIAGNOSIS — M4726 Other spondylosis with radiculopathy, lumbar region: Secondary | ICD-10-CM | POA: Diagnosis not present

## 2015-11-15 DIAGNOSIS — G894 Chronic pain syndrome: Secondary | ICD-10-CM | POA: Diagnosis not present

## 2015-11-15 DIAGNOSIS — G8929 Other chronic pain: Secondary | ICD-10-CM | POA: Diagnosis not present

## 2015-11-15 DIAGNOSIS — Z6828 Body mass index (BMI) 28.0-28.9, adult: Secondary | ICD-10-CM | POA: Diagnosis not present

## 2015-11-15 DIAGNOSIS — M47817 Spondylosis without myelopathy or radiculopathy, lumbosacral region: Secondary | ICD-10-CM | POA: Diagnosis not present

## 2015-11-15 DIAGNOSIS — M545 Low back pain: Secondary | ICD-10-CM | POA: Diagnosis not present

## 2015-11-15 DIAGNOSIS — M7061 Trochanteric bursitis, right hip: Secondary | ICD-10-CM | POA: Diagnosis not present

## 2015-11-15 DIAGNOSIS — M1712 Unilateral primary osteoarthritis, left knee: Secondary | ICD-10-CM | POA: Diagnosis not present

## 2015-11-15 DIAGNOSIS — M67919 Unspecified disorder of synovium and tendon, unspecified shoulder: Secondary | ICD-10-CM | POA: Diagnosis not present

## 2015-11-15 DIAGNOSIS — M719 Bursopathy, unspecified: Secondary | ICD-10-CM | POA: Diagnosis not present

## 2015-11-15 DIAGNOSIS — M7062 Trochanteric bursitis, left hip: Secondary | ICD-10-CM | POA: Diagnosis not present

## 2015-11-15 DIAGNOSIS — G5603 Carpal tunnel syndrome, bilateral upper limbs: Secondary | ICD-10-CM | POA: Diagnosis not present

## 2015-11-22 ENCOUNTER — Other Ambulatory Visit: Payer: Self-pay | Admitting: Emergency Medicine

## 2015-12-06 DIAGNOSIS — M25552 Pain in left hip: Secondary | ICD-10-CM | POA: Diagnosis not present

## 2015-12-06 DIAGNOSIS — M76892 Other specified enthesopathies of left lower limb, excluding foot: Secondary | ICD-10-CM | POA: Diagnosis not present

## 2015-12-06 DIAGNOSIS — Z471 Aftercare following joint replacement surgery: Secondary | ICD-10-CM | POA: Diagnosis not present

## 2015-12-06 DIAGNOSIS — Z96652 Presence of left artificial knee joint: Secondary | ICD-10-CM | POA: Diagnosis not present

## 2015-12-09 ENCOUNTER — Ambulatory Visit: Payer: Self-pay | Admitting: Emergency Medicine

## 2015-12-13 ENCOUNTER — Other Ambulatory Visit: Payer: Self-pay | Admitting: Emergency Medicine

## 2015-12-16 ENCOUNTER — Ambulatory Visit (INDEPENDENT_AMBULATORY_CARE_PROVIDER_SITE_OTHER): Payer: Medicare Other | Admitting: Emergency Medicine

## 2015-12-16 VITALS — BP 131/75 | HR 102 | Temp 98.2°F | Resp 18 | Ht 66.0 in | Wt 172.0 lb

## 2015-12-16 DIAGNOSIS — F32A Depression, unspecified: Secondary | ICD-10-CM

## 2015-12-16 DIAGNOSIS — F329 Major depressive disorder, single episode, unspecified: Secondary | ICD-10-CM | POA: Diagnosis not present

## 2015-12-16 DIAGNOSIS — E039 Hypothyroidism, unspecified: Secondary | ICD-10-CM | POA: Diagnosis not present

## 2015-12-16 DIAGNOSIS — F419 Anxiety disorder, unspecified: Secondary | ICD-10-CM

## 2015-12-16 MED ORDER — ALPRAZOLAM 0.5 MG PO TABS: ORAL_TABLET | ORAL | Status: DC

## 2015-12-16 MED ORDER — BUPROPION HCL ER (SR) 150 MG PO TB12: 150.0000 mg | ORAL_TABLET | Freq: Every day | ORAL | Status: DC

## 2015-12-16 MED ORDER — LEVOTHYROXINE SODIUM 100 MCG PO TABS: 100.0000 ug | ORAL_TABLET | Freq: Every day | ORAL | Status: DC

## 2015-12-16 NOTE — Progress Notes (Signed)
By signing my name below, I, [name], attest that this documentation has been prepared under the direction and in the presence of Tina Queen, MD.  [name], Medical Scribe. 12/16/2015.  10:23 AM.  Chief Complaint:  Chief Complaint  Patient presents with  . Follow-up  . Hypothyroidism    HPI: [name] is a 65 y.o. female with a history of melanoma, renal cancer, ovarian cancer, and recurrent pericarditis who reports to [facility] for a follow up.  Pt reports that she is doing well, she notes that she feels much better than she did 5 years ago. She is happy with her current situation.  Pt  indicates that she underwent a knee replacement surgery last October, from which she is still recovering. Pt did get PT on the area. Pt indicates that she had gained weight recently due to long periods of inactivity.  Pt follows up with her cardiologist, last time was for surgery clearance, and indicates that she had an [facility] cardiogram that showed normal results. Pt follows up with a nephrologist regularly, as well as with a dermatologist once a year. She reports that she had pulmonary nodules, last CT on 11/02/2014 showed that to be stable. She does indicate that she used to be a smoker, and notes that she quit smoking 3 years ago.   Pt is raising her granddaughter, and indicates that she is not having too much trouble with her.    Past Medical History  Diagnosis Date  . Chest pain at rest, rule out pericarditis 11/27/2012  . History of pericarditis, with pericardial window in 2009 11/27/2012  . Hypothyroidism 11/27/2012  . Venous insufficiency 11/27/2012  . Hx of melanoma of skin 11/27/2012  . Fibromyalgia   . CHF (congestive heart failure) ([place])   . Chronic kidney disease   . Anxiety   . Heart murmur   . Complication of anesthesia     "woke up twice during knee replacement" (08/19/2013)  . Pneumonia     "several times; including today", RUL/notes 08/19/2013  . Iron deficiency  anemia   . Anemia   . Arthritis     "everywhere" (08/19/2013)  . Chronic lower back pain   . Depression   . Hx of ovarian cancer 11/27/2012  . Kidney carcinoma ([place])     "both kidneys; ~ 3 yr apart" (08/19/2013)  . Melanoma of back ([place])   . Squamous carcinoma ([place])     "face, side of my nose, chest; used acid to get rid of them" (08/19/2013)  . SOB (shortness of breath)     occasional with exertion   . Headache(784.0)     hx of migraines   . Migraines   . Bilateral carpal tunnel syndrome    Past Surgical History  Procedure Laterality Date  . Joint replacement    . Left oophorectomy Left 1979  . Lumbar disc surgery  1984    "ruptured disc"  . Fracture surgery    . Doppler echocardiography  02/23/2011    LVEF>50%  . Partial nephrectomy Right 2005; ~ 2008  . Pericardial window  ~ 2012  . Tonsillectomy  1954  . Appendectomy    . Tubal ligation  1952  . Reduction mammaplasty Bilateral ~ 2008  . Shoulder arthroscopy w/ rotator cuff repair Right 1990's  . Total shoulder replacement Left 2006?  Marland Kitchen Total knee arthroplasty Right 1990's  . Knee arthroscopy Right     "twice before the replacement" (08/19/2013)  .  Abdominal hysterectomy  1979  . Right oophorectomy  ~ 2007    "it had cancer in it" (08/19/2013)  . Posterior lumbar fusion  2002?; 03/2012  . Melanoma excision  ~ 2010    "my lower back" (08/19/2013)  . Total knee arthroplasty Left 08/02/2015    Procedure: LEFT TOTAL KNEE ARTHROPLASTY;  Surgeon: Paralee Cancel, MD;  Location: [facility];  Service: Orthopedics;  Laterality: Left;   Social History   Social History  . Marital Status: Married    Spouse Name: N/A  . Number of Children: N/A  . Years of Education: N/A   Social History Main Topics  . Smoking status: Former Smoker -- 1.50 packs/day for 18 years    Types: Cigarettes    Quit date: 09/22/1985  . Smokeless tobacco: Never Used  . Alcohol Use: No  . Drug Use: No     Comment: prescribed  . Sexual Activity: No    Other Topics Concern  . Not on file   Social History Narrative   Married.   Family History  Problem Relation Age of Onset  . Cancer Mother   . Cancer Father   . Bipolar disorder Sister    Allergies  Allergen Reactions  . Amoxicillin     Has patient had a PCN reaction causing immediate rash, facial/tongue/throat swelling, SOB or lightheadedness with hypotension: No Has patient had a PCN reaction causing severe rash involving mucus membranes or skin necrosis: No Has patient had a PCN reaction that required hospitalization No Has patient had a PCN reaction occurring within the last 10 years: No If all of the above answers are "NO", then may proceed with Cephalosporin use.  . Ampicillin Rash    Has patient had a PCN reaction causing immediate rash, facial/tongue/throat swelling, SOB or lightheadedness with hypotension: No Has patient had a PCN reaction causing severe rash involving mucus membranes or skin necrosis: No Has patient had a PCN reaction that required hospitalization No Has patient had a PCN reaction occurring within the last 10 years: No If all of the above answers are "NO", then may proceed with Cephalosporin use.   Prior to Admission medications   Medication Sig Start Date End Date Taking? Authorizing Provider  acetaminophen (TYLENOL) 325 MG tablet Take 2 tablets (650 mg total) by mouth every 6 (six) hours as needed. 08/03/15   Danae Orleans, PA-C  ALPRAZolam Duanne Moron) 0.5 MG tablet TAKE 1 TABLET BY MOUTH 3 TIMES DAILY AS NEEDED FOR ANXIETY 09/02/15   [name], MD  azelastine (ASTELIN) 0.1 % nasal spray Place 2 sprays into both nostrils 2 (two) times daily. Use in each nostril as directed 01/14/15   [name], MD  buPROPion [facility] SR) 150 MG 12 hr tablet TAKE 1 TABLET BY MOUTH DAILY 06/01/15   [name], MD  ciclopirox [facility]) 8 % solution Apply topically at bedtime. Apply over nail and surrounding skin. Apply daily over previous coat. After seven (7)  days, may remove with alcohol and continue cycle. 05/26/15   [name], MD  cyclobenzaprine (FLEXERIL) 5 MG tablet Take 1 tablet (5 mg total) by mouth 3 (three) times daily as needed. For spasms 08/03/15   Danae Orleans, PA-C  docusate sodium (COLACE) 100 MG capsule Take 1 capsule (100 mg total) by mouth 2 (two) times daily. 08/03/15   Danae Orleans, PA-C  ferrous sulfate 325 (65 FE) MG tablet Take 1 tablet (325 mg total) by mouth 3 (three) times daily after meals. 08/03/15  Danae Orleans, PA-C  l-methylfolate-B6-B12 (METANX) 3-35-2 MG TABS tablet Take 1 tablet by mouth 2 (two) times daily.    Historical Provider, MD  levothyroxine (SYNTHROID, LEVOTHROID) 100 MCG tablet TAKE 1 TABLET BY MOUTH DAILY 11/22/15   [name], MD  metoprolol tartrate (LOPRESSOR) 25 MG tablet Take one half tablet twice a day 10/27/14   [name], MD  morphine (MS CONTIN) 30 MG 12 hr tablet Take 30 mg by mouth every 8 (eight) hours.     Historical Provider, MD  Multiple Vitamin (MULTIVITAMIN WITH MINERALS) TABS Take 1 tablet by mouth daily.    Historical Provider, MD  mupirocin ointment (BACTROBAN) 2 % Apply small amount to the lesion on your right leg 3 times a day 09/02/15   [name], MD  oxyCODONE 10 MG TABS Take 1-2 tablets (10-20 mg total) by mouth every 4 (four) hours as needed for severe pain. 08/03/15   Danae Orleans, PA-C  polyethylene glycol (MIRALAX / GLYCOLAX) packet Take 17 g by mouth 2 (two) times daily. 08/03/15   Matthew Babish, PA-C  PROAIR HFA 108 (90 BASE) MCG/ACT inhaler INHALE 2 PUFFS EVERY 4 HOURS AS NEEDED FOR WHEEZING, COUGH OR SHORTNESS OF BREATH    [name], MD  zolpidem (AMBIEN) 5 MG tablet TAKE 1 TABLET BY MOUTH AT BEDTIME AS NEEDED FOR SLEEP. 11/15/14   [name], MD     ROS: The patient has arthralgia.  She denies fevers, chills, night sweats, unintentional weight loss, chest pain, palpitations, wheezing, dyspnea on exertion, nausea, vomiting, abdominal pain, dysuria,  hematuria, melena, numbness, weakness, or tingling.  All other systems have been reviewed and were otherwise negative with the exception of those mentioned in the HPI and as above.    PHYSICAL EXAM: Filed Vitals:   12/16/15 1005 12/16/15 1006  BP: 144/78 131/75  Pulse: 105 102  Temp: 98.2 F (36.8 C)   Resp: 18    Body mass index is 27.77 kg/(m^2).   General: Alert, no acute distress HEENT:  Normocephalic, atraumatic, oropharynx patent. Eye: Juliette Mangle [facility] Cardiovascular:  Regular rate and rhythm, no rubs murmurs or gallops.  No Carotid bruits, radial pulse intact. No pedal edema.  Respiratory: Clear to auscultation bilaterally.  No wheezes, rales, or rhonchi.  No cyanosis, no use of accessory musculature Abdominal: No organomegaly, abdomen is soft and non-tender, positive bowel sounds.  No masses. Musculoskeletal: Gait intact. No edema, tenderness Skin: No rashes. Healing scar on the left knee.  Neurologic: Facial musculature symmetric. Psychiatric: Patient acts appropriately throughout our interaction. Lymphatic: No cervical or submandibular lymphadenopathy   LABS:    EKG/XRAY:   Primary read interpreted by Dr. [facility]lene Farrier at [facility].   ASSESSMENT/PLAN: Patient is doing very well. She has taken over the care of her teenage granddaughter and this is going well. She continues with pain management. She has been cleared by Dr. Einar Gip her cardiologist regarding her pericarditis. She also has a rheumatologist Dr. Ouida Sills to help with this. She sees Dr. Risa Grill irregularly for follow-up on her renal cancer. She also recently had removal of a trace of cell cancer on her leg and will have yearly skin checks with the dermatologist. I refilled her Wellbutrin alprazolam and Synthroid today.I personally performed the services described in this documentation, which was scribed in my presence. The recorded information has been reviewed and is accurate.    Gross sideeffects, risk and benefits, and  alternatives of medications d/w patient. Patient is aware that all medications  have potential sideeffects and we are unable to predict every sideeffect or drug-drug interaction that may occur.  Tina Queen MD 12/16/2015 10:10 AM

## 2015-12-24 DIAGNOSIS — N3020 Other chronic cystitis without hematuria: Secondary | ICD-10-CM | POA: Diagnosis not present

## 2015-12-24 DIAGNOSIS — D3000 Benign neoplasm of unspecified kidney: Secondary | ICD-10-CM | POA: Diagnosis not present

## 2015-12-24 DIAGNOSIS — Z85528 Personal history of other malignant neoplasm of kidney: Secondary | ICD-10-CM | POA: Diagnosis not present

## 2016-01-12 DIAGNOSIS — G5603 Carpal tunnel syndrome, bilateral upper limbs: Secondary | ICD-10-CM | POA: Diagnosis not present

## 2016-01-12 DIAGNOSIS — G894 Chronic pain syndrome: Secondary | ICD-10-CM | POA: Diagnosis not present

## 2016-01-12 DIAGNOSIS — Z6829 Body mass index (BMI) 29.0-29.9, adult: Secondary | ICD-10-CM | POA: Diagnosis not present

## 2016-01-12 DIAGNOSIS — M1712 Unilateral primary osteoarthritis, left knee: Secondary | ICD-10-CM | POA: Diagnosis not present

## 2016-01-21 ENCOUNTER — Other Ambulatory Visit: Payer: Self-pay | Admitting: Physician Assistant

## 2016-01-21 DIAGNOSIS — C4442 Squamous cell carcinoma of skin of scalp and neck: Secondary | ICD-10-CM | POA: Diagnosis not present

## 2016-01-28 ENCOUNTER — Encounter: Payer: Self-pay | Admitting: Emergency Medicine

## 2016-03-08 ENCOUNTER — Other Ambulatory Visit: Payer: Self-pay | Admitting: Emergency Medicine

## 2016-03-09 NOTE — Telephone Encounter (Signed)
Called in.

## 2016-03-13 DIAGNOSIS — M7062 Trochanteric bursitis, left hip: Secondary | ICD-10-CM | POA: Diagnosis not present

## 2016-03-13 DIAGNOSIS — M47817 Spondylosis without myelopathy or radiculopathy, lumbosacral region: Secondary | ICD-10-CM | POA: Diagnosis not present

## 2016-03-13 DIAGNOSIS — M719 Bursopathy, unspecified: Secondary | ICD-10-CM | POA: Diagnosis not present

## 2016-03-13 DIAGNOSIS — M7061 Trochanteric bursitis, right hip: Secondary | ICD-10-CM | POA: Diagnosis not present

## 2016-03-13 DIAGNOSIS — G894 Chronic pain syndrome: Secondary | ICD-10-CM | POA: Diagnosis not present

## 2016-03-13 DIAGNOSIS — G5603 Carpal tunnel syndrome, bilateral upper limbs: Secondary | ICD-10-CM | POA: Diagnosis not present

## 2016-03-13 DIAGNOSIS — M67919 Unspecified disorder of synovium and tendon, unspecified shoulder: Secondary | ICD-10-CM | POA: Diagnosis not present

## 2016-03-13 DIAGNOSIS — M1712 Unilateral primary osteoarthritis, left knee: Secondary | ICD-10-CM | POA: Diagnosis not present

## 2016-03-13 DIAGNOSIS — Z6829 Body mass index (BMI) 29.0-29.9, adult: Secondary | ICD-10-CM | POA: Diagnosis not present

## 2016-05-12 DIAGNOSIS — Z6830 Body mass index (BMI) 30.0-30.9, adult: Secondary | ICD-10-CM | POA: Diagnosis not present

## 2016-05-12 DIAGNOSIS — M1712 Unilateral primary osteoarthritis, left knee: Secondary | ICD-10-CM | POA: Diagnosis not present

## 2016-05-12 DIAGNOSIS — G894 Chronic pain syndrome: Secondary | ICD-10-CM | POA: Diagnosis not present

## 2016-05-12 DIAGNOSIS — M7061 Trochanteric bursitis, right hip: Secondary | ICD-10-CM | POA: Diagnosis not present

## 2016-05-12 DIAGNOSIS — M47817 Spondylosis without myelopathy or radiculopathy, lumbosacral region: Secondary | ICD-10-CM | POA: Diagnosis not present

## 2016-05-12 DIAGNOSIS — M67919 Unspecified disorder of synovium and tendon, unspecified shoulder: Secondary | ICD-10-CM | POA: Diagnosis not present

## 2016-05-12 DIAGNOSIS — M719 Bursopathy, unspecified: Secondary | ICD-10-CM | POA: Diagnosis not present

## 2016-05-12 DIAGNOSIS — M7062 Trochanteric bursitis, left hip: Secondary | ICD-10-CM | POA: Diagnosis not present

## 2016-06-01 ENCOUNTER — Telehealth: Payer: Self-pay | Admitting: Family Medicine

## 2016-06-01 NOTE — Telephone Encounter (Signed)
Pt is calling for a refill on her Xanax

## 2016-06-02 ENCOUNTER — Other Ambulatory Visit: Payer: Self-pay | Admitting: Emergency Medicine

## 2016-06-02 MED ORDER — ALPRAZOLAM 0.5 MG PO TABS: ORAL_TABLET | ORAL | 2 refills | Status: DC

## 2016-06-02 NOTE — Telephone Encounter (Signed)
Call patient. We have to decrease her Xanax dosage because of new recommendations from the CDC. I have decreased her Xanax to twice a day. She should continue to decrease the dosage over the next year.

## 2016-06-02 NOTE — Telephone Encounter (Signed)
Called [name] and advised of Dr Perfecto Kingdom message. [name] voiced understanding.

## 2016-06-02 NOTE — Telephone Encounter (Addendum)
Pt would like her ALPRAZolam (XANAX) 0.5 MG tablet script faxed to [place], [place]. I see where we have printed it off, she says we always fax it?

## 2016-06-02 NOTE — Progress Notes (Addendum)
Please call patient and tell her we are going to decrease her Xanax to twice a day because of the new recommendations from the CDC about being on pain medications and benzos together. Thanks

## 2016-06-02 NOTE — Telephone Encounter (Signed)
Rx alled in.

## 2016-06-05 NOTE — Progress Notes (Signed)
Pt advised.

## 2016-06-13 ENCOUNTER — Telehealth: Payer: Self-pay

## 2016-06-13 NOTE — Telephone Encounter (Signed)
Pt dropped of Dis. Handicap Placard form// requesting completion and mail to:  [address] [place], [place] [address]

## 2016-06-14 NOTE — Telephone Encounter (Signed)
Mailed today

## 2016-06-19 ENCOUNTER — Encounter: Payer: Self-pay | Admitting: Emergency Medicine

## 2016-06-23 DIAGNOSIS — M5417 Radiculopathy, lumbosacral region: Secondary | ICD-10-CM | POA: Diagnosis not present

## 2016-06-23 DIAGNOSIS — M4806 Spinal stenosis, lumbar region: Secondary | ICD-10-CM | POA: Diagnosis not present

## 2016-06-23 DIAGNOSIS — M4726 Other spondylosis with radiculopathy, lumbar region: Secondary | ICD-10-CM | POA: Diagnosis not present

## 2016-06-23 DIAGNOSIS — M5136 Other intervertebral disc degeneration, lumbar region: Secondary | ICD-10-CM | POA: Diagnosis not present

## 2016-06-27 ENCOUNTER — Telehealth: Payer: Self-pay | Admitting: Family Medicine

## 2016-06-27 NOTE — Telephone Encounter (Signed)
Patent calling stating that she need a copy of the DMV form that she left for [place] to sign she was told that we had all ready faxed forms to [facility] but she states that she has to send money to get the plaques so she's asking for a copy of DMV paper work please call patient when ready to pick up

## 2016-06-27 NOTE — Telephone Encounter (Signed)
Patient found out that the handicap form was mailed to [place] but wanted to pick them up herself. Patient wants to know if we still have a copy that she can pick up. Please advise!  [phone]

## 2016-06-29 NOTE — Telephone Encounter (Signed)
Filled out a new form as much as I can and put in Dr Perfecto Kingdom box for completion.

## 2016-07-03 NOTE — Telephone Encounter (Signed)
I located form and am placing it in an envelope in the pick up box. I called patient to inform her that it is ready. Orleans

## 2016-07-24 DIAGNOSIS — M719 Bursopathy, unspecified: Secondary | ICD-10-CM | POA: Diagnosis not present

## 2016-07-24 DIAGNOSIS — M1712 Unilateral primary osteoarthritis, left knee: Secondary | ICD-10-CM | POA: Diagnosis not present

## 2016-07-24 DIAGNOSIS — M7062 Trochanteric bursitis, left hip: Secondary | ICD-10-CM | POA: Diagnosis not present

## 2016-07-24 DIAGNOSIS — M67919 Unspecified disorder of synovium and tendon, unspecified shoulder: Secondary | ICD-10-CM | POA: Diagnosis not present

## 2016-07-24 DIAGNOSIS — M7061 Trochanteric bursitis, right hip: Secondary | ICD-10-CM | POA: Diagnosis not present

## 2016-07-24 DIAGNOSIS — G894 Chronic pain syndrome: Secondary | ICD-10-CM | POA: Diagnosis not present

## 2016-07-24 DIAGNOSIS — M47817 Spondylosis without myelopathy or radiculopathy, lumbosacral region: Secondary | ICD-10-CM | POA: Diagnosis not present

## 2016-08-04 ENCOUNTER — Ambulatory Visit: Payer: Medicare Other | Admitting: Podiatry

## 2016-08-04 ENCOUNTER — Encounter: Payer: Self-pay | Admitting: Emergency Medicine

## 2016-08-11 DIAGNOSIS — R232 Flushing: Secondary | ICD-10-CM | POA: Diagnosis not present

## 2016-08-11 DIAGNOSIS — R350 Frequency of micturition: Secondary | ICD-10-CM | POA: Diagnosis not present

## 2016-08-11 DIAGNOSIS — N189 Chronic kidney disease, unspecified: Secondary | ICD-10-CM | POA: Diagnosis not present

## 2016-08-11 DIAGNOSIS — R102 Pelvic and perineal pain: Secondary | ICD-10-CM | POA: Diagnosis not present

## 2016-08-11 DIAGNOSIS — R5383 Other fatigue: Secondary | ICD-10-CM | POA: Diagnosis not present

## 2016-08-14 ENCOUNTER — Other Ambulatory Visit: Payer: Self-pay | Admitting: Emergency Medicine

## 2016-08-14 DIAGNOSIS — N390 Urinary tract infection, site not specified: Secondary | ICD-10-CM | POA: Diagnosis not present

## 2016-08-20 ENCOUNTER — Encounter: Payer: Self-pay | Admitting: Emergency Medicine

## 2016-08-21 ENCOUNTER — Encounter: Payer: Self-pay | Admitting: *Deleted

## 2016-10-23 DIAGNOSIS — C3490 Malignant neoplasm of unspecified part of unspecified bronchus or lung: Secondary | ICD-10-CM

## 2016-10-23 HISTORY — DX: Malignant neoplasm of unspecified part of unspecified bronchus or lung: C34.90

## 2016-10-25 DIAGNOSIS — M7061 Trochanteric bursitis, right hip: Secondary | ICD-10-CM | POA: Diagnosis not present

## 2016-10-25 DIAGNOSIS — M67919 Unspecified disorder of synovium and tendon, unspecified shoulder: Secondary | ICD-10-CM | POA: Diagnosis not present

## 2016-10-25 DIAGNOSIS — M719 Bursopathy, unspecified: Secondary | ICD-10-CM | POA: Diagnosis not present

## 2016-10-25 DIAGNOSIS — M7062 Trochanteric bursitis, left hip: Secondary | ICD-10-CM | POA: Diagnosis not present

## 2016-10-25 DIAGNOSIS — M47817 Spondylosis without myelopathy or radiculopathy, lumbosacral region: Secondary | ICD-10-CM | POA: Diagnosis not present

## 2016-10-25 DIAGNOSIS — M1712 Unilateral primary osteoarthritis, left knee: Secondary | ICD-10-CM | POA: Diagnosis not present

## 2016-10-25 DIAGNOSIS — G894 Chronic pain syndrome: Secondary | ICD-10-CM | POA: Diagnosis not present

## 2016-11-13 DIAGNOSIS — Z1231 Encounter for screening mammogram for malignant neoplasm of breast: Secondary | ICD-10-CM | POA: Diagnosis not present

## 2016-11-13 DIAGNOSIS — Z6830 Body mass index (BMI) 30.0-30.9, adult: Secondary | ICD-10-CM | POA: Diagnosis not present

## 2016-11-13 DIAGNOSIS — Z01419 Encounter for gynecological examination (general) (routine) without abnormal findings: Secondary | ICD-10-CM | POA: Diagnosis not present

## 2016-12-21 ENCOUNTER — Other Ambulatory Visit: Payer: Self-pay | Admitting: Emergency Medicine

## 2016-12-22 ENCOUNTER — Telehealth: Payer: Self-pay | Admitting: Family Medicine

## 2016-12-22 ENCOUNTER — Encounter: Payer: Self-pay | Admitting: Family Medicine

## 2016-12-22 NOTE — Telephone Encounter (Signed)
My Chart message sent to patient   Ms. Cocker,   You will need to call the office and schedule an appointment with Dr. Delia Chimes. Dr. Everlene Farrier has retired and you will need an office visit to establish care before any prescriptions can be re-filled.  Our office number is [phone].   Thanks,   Carroll Sage. Kenton Kingfisher, MSN, FNP-C  [facility]  [phone]   Ms. Berkley Harvey,

## 2016-12-22 NOTE — Telephone Encounter (Signed)
Pt is needing to check on her welbutrin refill request   Best number [phone]

## 2016-12-27 ENCOUNTER — Ambulatory Visit (INDEPENDENT_AMBULATORY_CARE_PROVIDER_SITE_OTHER): Payer: Medicare Other | Admitting: Family Medicine

## 2016-12-27 ENCOUNTER — Encounter: Payer: Self-pay | Admitting: Family Medicine

## 2016-12-27 VITALS — BP 136/78 | HR 99 | Temp 98.7°F | Resp 18 | Ht 66.0 in | Wt 185.0 lb

## 2016-12-27 DIAGNOSIS — M170 Bilateral primary osteoarthritis of knee: Secondary | ICD-10-CM

## 2016-12-27 DIAGNOSIS — Z85528 Personal history of other malignant neoplasm of kidney: Secondary | ICD-10-CM

## 2016-12-27 DIAGNOSIS — E039 Hypothyroidism, unspecified: Secondary | ICD-10-CM | POA: Diagnosis not present

## 2016-12-27 DIAGNOSIS — F411 Generalized anxiety disorder: Secondary | ICD-10-CM | POA: Diagnosis not present

## 2016-12-27 DIAGNOSIS — Z8543 Personal history of malignant neoplasm of ovary: Secondary | ICD-10-CM | POA: Diagnosis not present

## 2016-12-27 DIAGNOSIS — D649 Anemia, unspecified: Secondary | ICD-10-CM | POA: Diagnosis not present

## 2016-12-27 DIAGNOSIS — E663 Overweight: Secondary | ICD-10-CM | POA: Diagnosis not present

## 2016-12-27 DIAGNOSIS — Z23 Encounter for immunization: Secondary | ICD-10-CM | POA: Diagnosis not present

## 2016-12-27 DIAGNOSIS — Z87891 Personal history of nicotine dependence: Secondary | ICD-10-CM | POA: Diagnosis not present

## 2016-12-27 DIAGNOSIS — R918 Other nonspecific abnormal finding of lung field: Secondary | ICD-10-CM

## 2016-12-27 DIAGNOSIS — Z122 Encounter for screening for malignant neoplasm of respiratory organs: Secondary | ICD-10-CM

## 2016-12-27 MED ORDER — BUPROPION HCL ER (SR) 150 MG PO TB12: 150.0000 mg | ORAL_TABLET | Freq: Every day | ORAL | 11 refills | Status: DC

## 2016-12-27 NOTE — Patient Instructions (Signed)
     IF you received an x-ray today, you will receive an invoice from [facility]. Please contact [facility] at [phone] with questions or concerns regarding your invoice.   IF you received labwork today, you will receive an invoice from [facility]. Please contact [facility] at [phone] with questions or concerns regarding your invoice.   Our billing staff will not be able to assist you with questions regarding bills from these companies.  You will be contacted with the lab results as soon as they are available. The fastest way to get your results is to activate your My Chart account. Instructions are located on the last page of this paperwork. If you have not heard from us regarding the results in 2 weeks, please contact this office.     

## 2016-12-27 NOTE — Progress Notes (Signed)
Chief Complaint  Patient presents with  . Establish Care    Daub old patient    HPI   Hypothyroidism: Patient presents for evaluation of thyroid function. Symptoms consist of denies fatigue, weight changes, heat/cold intolerance, bowel/skin changes or CVS symptoms. The symptoms are absent. No symptoms. .  The problem has been stable.  Previous thyroid studies include TSH. The hypothyroidism is due to hypothyroidism.  Lab Results  Component Value Date   TSH 0.360 04/27/2015   OA of knees and Overweight She reports that she had bilateral knee replacements and reports that she has been having pain which limited her mobility. Now her mobility is better.  She reports a weight gain of 30 pounds Diethyproprion prescribed by Gynecology to help her with weight loss.  Wt Readings from Last 3 Encounters:  12/27/16 185 lb (83.9 kg)  12/16/15 172 lb (78 kg)  09/02/15 166 lb 3.2 oz (75.4 kg)   Lab Results  Component Value Date   HGB 10.5 (L) 08/03/2015   Lung Nodules She is a former smoker who quit 31 years ago She has been getting CT chest to look at this particular nodule in the RUL measuring 20m that appeared benign and was stable She denies SOB, wheezing or hemoptysis  Anemia Pt is not taking iron. She started Vit B12 She reports that Gynecology checked her levels and it was a hemoglobin 04 December 2016 She denies palpitations No evidence of blood in her stool or urine  Denies cold interolerance Denies dizziness  Anxiety Disorder She reports that she was started on Wellbutrin and Xanax for anxiety She was stopped off her xanax She denies panic attacks   Past Medical History:  Diagnosis Date  . Anemia   . Anxiety   . Arthritis    "everywhere" (08/19/2013)  . Bilateral carpal tunnel syndrome   . Chest pain at rest, rule out pericarditis 11/27/2012  . [place] heart [place]) [place]   . Chronic kidney disease   . Chronic lower back pain   . Complication of anesthesia     "woke up twice during knee replacement" (08/19/2013)  . Depression   . Fibromyalgia   . Headache(784.0)    hx of migraines   . Heart murmur   . History of pericarditis, with pericardial window in 2009 11/27/2012  . Hx of melanoma of skin 11/27/2012  . Hx of ovarian cancer 11/27/2012  . Hypothyroidism 11/27/2012  . Iron deficiency anemia   . [place]    "both kidneys; ~ 3 yr apart" (08/19/2013)  . [place]   . Migraines   . Pneumonia    "several times; including today", RUL/notes 08/19/2013  . SOB (shortness of breath)    occasional with exertion   . Squamous carcinoma    "face, side of my nose, chest; used acid to get rid of them" (08/19/2013)  . Venous insufficiency 11/27/2012    Current Outpatient Prescriptions  Medication Sig Dispense Refill  . acetaminophen (TYLENOL) 325 MG tablet Take 2 tablets (650 mg total) by mouth every 6 (six) hours as needed.    . ALPRAZolam (XANAX) 0.5 MG tablet TAKE 1 TABLET BY MOUTH 2 TIMES DAILY AS NEEDED FOR ANXIETY. 60 tablet 2  . azelastine (ASTELIN) 0.1 % nasal spray USE 2 SPRAYS IN BOTH NOSTRILS TWICE DAILY 30 mL 0  . buPROPion (WELLBUTRIN SR) 150 MG 12 hr tablet Take 1 tablet (150 mg total) by mouth daily. 30 tablet 11  . buPROPion (ZYBAN)  150 MG 12 hr tablet TAKE 1 TABLET BY MOUTH ONCE DAILY. 30 tablet 0  . ciclopirox (PENLAC) 8 % solution Apply topically at bedtime. Apply over nail and surrounding skin. Apply daily over previous coat. After seven (7) days, may remove with alcohol and continue cycle. 6.6 mL 2  . cyclobenzaprine (FLEXERIL) 5 MG tablet Take 1 tablet (5 mg total) by mouth 3 (three) times daily as needed. For spasms 30 tablet 0  . Diethylpropion HCl CR 75 MG TB24     . docusate sodium (COLACE) 100 MG capsule Take 1 capsule (100 mg total) by mouth 2 (two) times daily. 10 capsule 0  . ferrous sulfate 325 (65 FE) MG tablet Take 1 tablet (325 mg total) by mouth 3 (three) times daily after meals.  3  .  l-methylfolate-B6-B12 (METANX) 3-35-2 MG TABS tablet Take 1 tablet by mouth 2 (two) times daily.    Marland Kitchen levothyroxine (SYNTHROID, LEVOTHROID) 100 MCG tablet Take 1 tablet (100 mcg total) by mouth daily. 30 tablet 11  . metoprolol tartrate (LOPRESSOR) 25 MG tablet Take one half tablet twice a day 90 tablet 3  . morphine (MS CONTIN) 30 MG 12 hr tablet Take 30 mg by mouth every 8 (eight) hours.     . Multiple Vitamin (MULTIVITAMIN WITH MINERALS) TABS Take 1 tablet by mouth daily.    . mupirocin ointment (BACTROBAN) 2 % Apply small amount to the lesion on your right leg 3 times a day 30 g 0  . oxyCODONE 10 MG TABS Take 1-2 tablets (10-20 mg total) by mouth every 4 (four) hours as needed for severe pain. 100 tablet 0  . polyethylene glycol (MIRALAX / GLYCOLAX) packet Take 17 g by mouth 2 (two) times daily. 14 each 0  . PROAIR HFA 108 (90 BASE) MCG/ACT inhaler INHALE 2 PUFFS EVERY 4 HOURS AS NEEDED FOR WHEEZING, COUGH OR SHORTNESS OF BREATH 8.5 g 3  . zolpidem (AMBIEN) 5 MG tablet TAKE 1 TABLET BY MOUTH AT BEDTIME AS NEEDED FOR SLEEP. 30 tablet 2   No current facility-administered medications for this visit.     Allergies:  Allergies  Allergen Reactions  . Amoxicillin     Has patient had a PCN reaction causing immediate rash, facial/tongue/throat swelling, SOB or lightheadedness with hypotension: No Has patient had a PCN reaction causing severe rash involving mucus membranes or skin necrosis: No Has patient had a PCN reaction that required hospitalization No Has patient had a PCN reaction occurring within the last 10 years: No If all of the above answers are "NO", then may proceed with Cephalosporin use.  . Ampicillin Rash    Has patient had a PCN reaction causing immediate rash, facial/tongue/throat swelling, SOB or lightheadedness with hypotension: No Has patient had a PCN reaction causing severe rash involving mucus membranes or skin necrosis: No Has patient had a PCN reaction that required  hospitalization No Has patient had a PCN reaction occurring within the last 10 years: No If all of the above answers are "NO", then may proceed with Cephalosporin use.    Past Surgical History:  Procedure Laterality Date  . ABDOMINAL HYSTERECTOMY  1979  . APPENDECTOMY    . DOPPLER ECHOCARDIOGRAPHY  02/23/2011   LVEF>50%  . FRACTURE SURGERY    . JOINT REPLACEMENT    . KNEE ARTHROSCOPY Right    "twice before the replacement" (08/19/2013)  . LEFT OOPHORECTOMY Left 1979  . [place]   "ruptured disc"  . MELANOMA EXCISION  ~  2010   "my lower back" (08/19/2013)  . PARTIAL NEPHRECTOMY Right 2005; ~ 2008  . PERICARDIAL WINDOW  ~ 2012  . POSTERIOR LUMBAR FUSION  2002?; 03/2012  . REDUCTION MAMMAPLASTY Bilateral ~ 2008  . RIGHT OOPHORECTOMY  ~ 2007   "it had cancer in it" (08/19/2013)  . SHOULDER ARTHROSCOPY W/ ROTATOR CUFF REPAIR Right 1990's  . TONSILLECTOMY  1954  . TOTAL KNEE ARTHROPLASTY Right 1990's  . TOTAL KNEE ARTHROPLASTY Left 08/02/2015   Procedure: LEFT TOTAL KNEE ARTHROPLASTY;  Surgeon: Paralee Cancel, MD;  Location: [facility];  Service: Orthopedics;  Laterality: Left;  . TOTAL SHOULDER REPLACEMENT Left 2006?  . TUBAL LIGATION  1952    Social History   Social History  . Marital status: Married    Spouse name: N/A  . Number of children: N/A  . Years of education: N/A   Social History Main Topics  . Smoking status: Former Smoker    Packs/day: 1.50    Years: 18.00    Types: Cigarettes    Quit date: 09/22/1985  . Smokeless tobacco: Never Used  . Alcohol use No  . Drug use: No     Comment: prescribed  . Sexual activity: No   Other Topics Concern  . None   Social History Narrative   Married.    ROS See hpi  Objective: Vitals:   12/27/16 1416  BP: 136/78  Pulse: 99  Resp: 18  Temp: 98.7 F (37.1 C)  TempSrc: Oral  SpO2: 99%  Weight: 185 lb (83.9 kg)  Height: '5\' 6"'$  (1.676 m)   Body mass index is 29.86 kg/m.  Physical Exam    Constitutional: She appears well-developed and well-nourished.  HENT:  Head: Normocephalic and atraumatic.  Nose: Nose normal.  Mouth/Throat: Oropharynx is clear and moist. No oropharyngeal exudate.  Eyes: Conjunctivae and EOM are normal.  Neck: Normal range of motion. Neck supple. No thyromegaly present.  Cardiovascular: Normal rate, regular rhythm, normal heart sounds and intact distal pulses.   No murmur heard. Pulmonary/Chest: Effort normal and breath sounds normal. No respiratory distress. She has no wheezes. She has no rales.  Musculoskeletal: Normal range of motion. She exhibits no edema.  No crepitus of knees bilaterally, no joint line tenderness, no effusion   Study Result   CLINICAL DATA:  Followup indeterminate right lung nodules. Personal history of renal carcinoma, ovarian carcinoma, and melanoma.  EXAM: CT CHEST WITHOUT CONTRAST  TECHNIQUE: Multidetector CT imaging of the chest was performed following the standard protocol without IV contrast.  COMPARISON:  04/29/2014 and 11/27/2012  FINDINGS: Mediastinum/Lymph Nodes: No masses or pathologically enlarged lymph nodes identified.  Lungs/Pleura: A 6 mm pulmonary nodule is again seen in the posterior right upper lobe on image 20 which is stable compared to previous studies dating back to 11/27/2012. Two adjacent 3 mm peripheral pulmonary nodules are also noted, and are stable. No new or enlarging pulmonary nodules or masses identified. No evidence of acute infiltrate.  Musculoskeletal/Soft Tissues: No suspicious bone lesions or other significant chest wall abnormality.  Upper Abdomen:  Unremarkable.  IMPRESSION: Stable subcentimeter right upper lobe pulmonary nodules dating back to prior chest CT on 11/27/2012, consistent with benign etiology. No other significant abnormality identified.   Electronically Signed   By: Earle Gell M.D.   On: 11/02/2014 15:30    Assessment and Plan Amberli was  seen today for establish care.  Diagnoses and all orders for this visit:  Need for prophylactic vaccination and inoculation against influenza -  Flu Vaccine QUAD 36+ mos IM  OA of knees- continue with physical therapy, discussed movement and stretching  Anemia- improved, continue current diet and b12 supplements  Acquired hypothyroidism- will assess levels and refill levothyroxine as appropriate -     TSH -     Lipid panel  Generalized anxiety disorder- stable on wellbutrin, refill sent to pharmacy today Will not refill xanax If pt's develops worsening anxiety she should stop the amphetamine  Overweight-  Pt is on an amphetamine and wellbutrin. Discussed that once she is able to move more then her weight loss will improve  Pulmonary nodules Screening for malignant neoplasm of respiratory organ Lung nodules Personal history of renal cancer H/O ovarian cancer Former smoker -     CT CHEST NODULE FOLLOW UP LOW DOSE W/O; Future For the above diagnoses will continue to repeat CT chest and if the image is stable will lengthen the interval between screening  Follow up in six months for thyroid and lipid follow up   A total of 40 minutes were spent face-to-face with the patient during this encounter and over half of that time was spent on counseling and coordination of care.  Also reviewed previous imagining studies for ct chest and previous labs with patient and provided education about med monitoring.   [place]

## 2016-12-28 ENCOUNTER — Other Ambulatory Visit: Payer: Self-pay | Admitting: Family Medicine

## 2016-12-28 ENCOUNTER — Encounter: Payer: Self-pay | Admitting: Family Medicine

## 2016-12-28 LAB — LIPID PANEL
Chol/HDL Ratio: 4.3 ratio units (ref 0.0–4.4)
Cholesterol, Total: 300 mg/dL — ABNORMAL HIGH (ref 100–199)
HDL: 70 mg/dL (ref >39)
LDL Calculated: 202 mg/dL — ABNORMAL HIGH (ref 0–99)
Triglycerides: 142 mg/dL (ref 0–149)
VLDL Cholesterol Cal: 28 mg/dL (ref 5–40)

## 2016-12-28 LAB — TSH: TSH: 0.397 u[IU]/mL — ABNORMAL LOW (ref 0.450–4.500)

## 2016-12-28 MED ORDER — LEVOTHYROXINE SODIUM 100 MCG PO TABS: ORAL_TABLET | ORAL | 6 refills | Status: DC

## 2016-12-28 MED ORDER — ATORVASTATIN CALCIUM 10 MG PO TABS: 10.0000 mg | ORAL_TABLET | Freq: Every day | ORAL | 3 refills | Status: DC

## 2017-01-17 ENCOUNTER — Other Ambulatory Visit: Payer: Self-pay | Admitting: Physician Assistant

## 2017-01-17 DIAGNOSIS — D229 Melanocytic nevi, unspecified: Secondary | ICD-10-CM | POA: Diagnosis not present

## 2017-01-17 DIAGNOSIS — D045 Carcinoma in situ of skin of trunk: Secondary | ICD-10-CM | POA: Diagnosis not present

## 2017-01-17 DIAGNOSIS — L570 Actinic keratosis: Secondary | ICD-10-CM | POA: Diagnosis not present

## 2017-01-17 DIAGNOSIS — Z85828 Personal history of other malignant neoplasm of skin: Secondary | ICD-10-CM | POA: Diagnosis not present

## 2017-01-22 DIAGNOSIS — M7062 Trochanteric bursitis, left hip: Secondary | ICD-10-CM | POA: Diagnosis not present

## 2017-01-22 DIAGNOSIS — G5603 Carpal tunnel syndrome, bilateral upper limbs: Secondary | ICD-10-CM | POA: Diagnosis not present

## 2017-01-22 DIAGNOSIS — M47817 Spondylosis without myelopathy or radiculopathy, lumbosacral region: Secondary | ICD-10-CM | POA: Diagnosis not present

## 2017-01-22 DIAGNOSIS — M7061 Trochanteric bursitis, right hip: Secondary | ICD-10-CM | POA: Diagnosis not present

## 2017-01-22 DIAGNOSIS — M67919 Unspecified disorder of synovium and tendon, unspecified shoulder: Secondary | ICD-10-CM | POA: Diagnosis not present

## 2017-01-22 DIAGNOSIS — M1712 Unilateral primary osteoarthritis, left knee: Secondary | ICD-10-CM | POA: Diagnosis not present

## 2017-01-22 DIAGNOSIS — G894 Chronic pain syndrome: Secondary | ICD-10-CM | POA: Diagnosis not present

## 2017-01-22 DIAGNOSIS — M719 Bursopathy, unspecified: Secondary | ICD-10-CM | POA: Diagnosis not present

## 2017-02-22 ENCOUNTER — Other Ambulatory Visit: Payer: Self-pay | Admitting: Physician Assistant

## 2017-02-22 DIAGNOSIS — D044 Carcinoma in situ of skin of scalp and neck: Secondary | ICD-10-CM | POA: Diagnosis not present

## 2017-02-22 DIAGNOSIS — C44529 Squamous cell carcinoma of skin of other part of trunk: Secondary | ICD-10-CM | POA: Diagnosis not present

## 2017-03-05 DIAGNOSIS — S7002XA Contusion of left hip, initial encounter: Secondary | ICD-10-CM | POA: Diagnosis not present

## 2017-03-05 DIAGNOSIS — S63502A Unspecified sprain of left wrist, initial encounter: Secondary | ICD-10-CM | POA: Diagnosis not present

## 2017-04-30 DIAGNOSIS — G894 Chronic pain syndrome: Secondary | ICD-10-CM | POA: Diagnosis not present

## 2017-04-30 DIAGNOSIS — M7061 Trochanteric bursitis, right hip: Secondary | ICD-10-CM | POA: Diagnosis not present

## 2017-04-30 DIAGNOSIS — M67919 Unspecified disorder of synovium and tendon, unspecified shoulder: Secondary | ICD-10-CM | POA: Diagnosis not present

## 2017-04-30 DIAGNOSIS — M47817 Spondylosis without myelopathy or radiculopathy, lumbosacral region: Secondary | ICD-10-CM | POA: Diagnosis not present

## 2017-04-30 DIAGNOSIS — M7062 Trochanteric bursitis, left hip: Secondary | ICD-10-CM | POA: Diagnosis not present

## 2017-04-30 DIAGNOSIS — M1712 Unilateral primary osteoarthritis, left knee: Secondary | ICD-10-CM | POA: Diagnosis not present

## 2017-04-30 DIAGNOSIS — M719 Bursopathy, unspecified: Secondary | ICD-10-CM | POA: Diagnosis not present

## 2017-05-03 DIAGNOSIS — D044 Carcinoma in situ of skin of scalp and neck: Secondary | ICD-10-CM | POA: Diagnosis not present

## 2017-05-03 DIAGNOSIS — Z85820 Personal history of malignant melanoma of skin: Secondary | ICD-10-CM | POA: Diagnosis not present

## 2017-05-03 DIAGNOSIS — L905 Scar conditions and fibrosis of skin: Secondary | ICD-10-CM | POA: Diagnosis not present

## 2017-05-03 DIAGNOSIS — D692 Other nonthrombocytopenic purpura: Secondary | ICD-10-CM | POA: Diagnosis not present

## 2017-05-03 DIAGNOSIS — D1801 Hemangioma of skin and subcutaneous tissue: Secondary | ICD-10-CM | POA: Diagnosis not present

## 2017-05-03 DIAGNOSIS — L570 Actinic keratosis: Secondary | ICD-10-CM | POA: Diagnosis not present

## 2017-05-03 DIAGNOSIS — D485 Neoplasm of uncertain behavior of skin: Secondary | ICD-10-CM | POA: Diagnosis not present

## 2017-05-17 ENCOUNTER — Ambulatory Visit: Payer: Medicare Other

## 2017-05-17 ENCOUNTER — Encounter: Payer: Medicare Other | Admitting: Podiatry

## 2017-05-17 DIAGNOSIS — M79672 Pain in left foot: Secondary | ICD-10-CM

## 2017-05-18 ENCOUNTER — Encounter: Payer: Medicare Other | Admitting: Podiatry

## 2017-05-21 DIAGNOSIS — D044 Carcinoma in situ of skin of scalp and neck: Secondary | ICD-10-CM | POA: Diagnosis not present

## 2017-05-31 DIAGNOSIS — D3001 Benign neoplasm of right kidney: Secondary | ICD-10-CM | POA: Diagnosis not present

## 2017-05-31 DIAGNOSIS — R3914 Feeling of incomplete bladder emptying: Secondary | ICD-10-CM | POA: Diagnosis not present

## 2017-05-31 DIAGNOSIS — Z85528 Personal history of other malignant neoplasm of kidney: Secondary | ICD-10-CM | POA: Diagnosis not present

## 2017-06-01 ENCOUNTER — Ambulatory Visit: Payer: Medicare Other | Admitting: Podiatry

## 2017-06-13 ENCOUNTER — Ambulatory Visit: Payer: Medicare Other | Admitting: Podiatry

## 2017-06-18 DIAGNOSIS — S80219A Abrasion, unspecified knee, initial encounter: Secondary | ICD-10-CM | POA: Diagnosis not present

## 2017-06-27 NOTE — Progress Notes (Signed)
This encounter was created in error - please disregard.

## 2017-06-27 NOTE — Progress Notes (Signed)
.  err This encounter was created in error - please disregard. 

## 2017-06-29 ENCOUNTER — Encounter: Payer: Self-pay | Admitting: Family Medicine

## 2017-07-03 NOTE — Telephone Encounter (Signed)
I still see an active referral order for the CT.  Please assist this patient with help scheduling. If it cannot be done in time then it would be worthwhile pushing the appointment back to a later date.

## 2017-07-04 ENCOUNTER — Ambulatory Visit: Payer: Medicare Other | Admitting: Family Medicine

## 2017-07-08 NOTE — Telephone Encounter (Signed)
noted 

## 2017-07-12 DIAGNOSIS — S62657A Nondisplaced fracture of medial phalanx of left little finger, initial encounter for closed fracture: Secondary | ICD-10-CM | POA: Diagnosis not present

## 2017-07-13 ENCOUNTER — Ambulatory Visit
Admission: RE | Admit: 2017-07-13 | Discharge: 2017-07-13 | Disposition: A | Discharge status: Home / Self Care | Payer: Medicare Other | Source: Ambulatory Visit | Attending: Family Medicine | Admitting: Family Medicine

## 2017-07-13 DIAGNOSIS — Z8543 Personal history of malignant neoplasm of ovary: Secondary | ICD-10-CM

## 2017-07-13 DIAGNOSIS — Z85528 Personal history of other malignant neoplasm of kidney: Secondary | ICD-10-CM

## 2017-07-13 DIAGNOSIS — R911 Solitary pulmonary nodule: Secondary | ICD-10-CM | POA: Diagnosis not present

## 2017-07-13 DIAGNOSIS — Z122 Encounter for screening for malignant neoplasm of respiratory organs: Secondary | ICD-10-CM

## 2017-07-13 DIAGNOSIS — Z87891 Personal history of nicotine dependence: Secondary | ICD-10-CM

## 2017-07-13 DIAGNOSIS — R918 Other nonspecific abnormal finding of lung field: Secondary | ICD-10-CM

## 2017-07-23 ENCOUNTER — Telehealth: Payer: Self-pay | Admitting: Family Medicine

## 2017-07-23 DIAGNOSIS — R918 Other nonspecific abnormal finding of lung field: Secondary | ICD-10-CM

## 2017-07-23 NOTE — Telephone Encounter (Signed)
Pt called stated she was suppose to have a referral in for pulmonary, I didn't see one in there.  Please advise

## 2017-07-25 DIAGNOSIS — G5603 Carpal tunnel syndrome, bilateral upper limbs: Secondary | ICD-10-CM | POA: Diagnosis not present

## 2017-07-25 DIAGNOSIS — M67919 Unspecified disorder of synovium and tendon, unspecified shoulder: Secondary | ICD-10-CM | POA: Diagnosis not present

## 2017-07-25 DIAGNOSIS — G894 Chronic pain syndrome: Secondary | ICD-10-CM | POA: Insufficient documentation

## 2017-07-25 DIAGNOSIS — M170 Bilateral primary osteoarthritis of knee: Secondary | ICD-10-CM | POA: Diagnosis not present

## 2017-07-26 NOTE — Telephone Encounter (Signed)
Pt called following up on request for pulmonary referral. She said she would be okay with going to [place]. Please place referral if appropriate. Pt said she would like to know what is going on with her following her recent CT scan.

## 2017-07-27 ENCOUNTER — Encounter: Payer: Self-pay | Admitting: Family Medicine

## 2017-07-30 NOTE — Telephone Encounter (Signed)
Please see if referrals can send this request to [facility] as a stat referral.  She already had imaging done on 9/21 that is concerning.  She will need urgent follow up.

## 2017-07-31 NOTE — Telephone Encounter (Signed)
Referral sent to [facility] on 10/8. They have scheduled pt for 08/08/17 at 3pm. Is this appt date okay?

## 2017-07-31 NOTE — Telephone Encounter (Signed)
Spoke with pt advised appt scheduled with [facility] 08/08/17 at 3 pm, pt advises appt date and time is fine.  Advised imaging done on 9/21 needs urgent f/u pt advises she will call back to schedule appt. DGaddy, CMA

## 2017-08-05 NOTE — Telephone Encounter (Signed)
Noted thank you

## 2017-08-06 ENCOUNTER — Telehealth: Payer: Self-pay | Admitting: Family Medicine

## 2017-08-06 NOTE — Telephone Encounter (Signed)
Please advise  Pharmacy request t is ok to change the medication generic synthroid to a new manufactur ?

## 2017-08-06 NOTE — Telephone Encounter (Signed)
Please notify the patient or pharmacy that a change would be okay.

## 2017-08-06 NOTE — Telephone Encounter (Signed)
Pharmacy is calling to confirm that it is ok to change the medication generic synthroid to a new manufactur   [facility] [phone]

## 2017-08-08 ENCOUNTER — Ambulatory Visit (INDEPENDENT_AMBULATORY_CARE_PROVIDER_SITE_OTHER): Payer: Medicare Other | Admitting: Emergency Medicine

## 2017-08-08 ENCOUNTER — Encounter: Payer: Self-pay | Admitting: Emergency Medicine

## 2017-08-08 VITALS — BP 122/76 | HR 80 | Ht 65.0 in | Wt 170.6 lb

## 2017-08-08 DIAGNOSIS — R911 Solitary pulmonary nodule: Secondary | ICD-10-CM

## 2017-08-08 DIAGNOSIS — Z720 Tobacco use: Secondary | ICD-10-CM

## 2017-08-08 NOTE — Assessment & Plan Note (Signed)
11 mm groundglass right upper lobe nodule that has enlarged on serial CT scans. 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 Pros and cons of bronchoscopy for tissue biopsy versus referral to thoracic surgery for primary resection. We will perform a PET scan, pulmonary function testing to assist [place] in the decision making. She will follow up after these tests to talk about the pros and cons of each approach. I have recommended against more serial scans given my suspicion that this is a primary malignancy.

## 2017-08-08 NOTE — Patient Instructions (Signed)
We will perform full pulmonary function testing  We will arrange for a PET scan  Follow with Dr. Lamonte Sakai next available after your testing is performed so that we can discuss the results, make decisions about next steps.

## 2017-08-08 NOTE — Progress Notes (Signed)
Subjective:    Patient ID: [name], female    DOB: 12-Sep-1951, 66 y.o.   MRN: [id number]  HPI 66 year old former smoker (30 pack years) with arthritis, fibromyalgia, renal cell cancer, ovarian cancer, melanoma excision. She is referred today for evaluation of pulmonary nodule on CT scan of the chest.   A review of her CT scans of the chest shows that she has had a posterior right upper lobe nodule first identified on CT 04/29/14. I reviewed all of her scans including her most recent done 07/13/17. There has been an interval increase in size over that time. It now measures approximately 11 x 11 mm, has some groundglass and solid components. The report indicates there may be smaller sub-4 mm nodules present. I do not see any predominant small nodules  She has some dyspnea with a hill, able to walk on flat ground. Sometimes SOB with walking on flat ground. No cough. Some sinus drainage right now. No wheeze.    Review of Systems  Constitutional: Negative for fever and unexpected weight change.  HENT: Positive for sinus pressure. Negative for congestion, dental problem, ear pain, nosebleeds, postnasal drip, rhinorrhea, sneezing, sore throat and trouble swallowing.   Eyes: Negative for redness and itching.  Respiratory: Positive for shortness of breath. Negative for cough, chest tightness and wheezing.   Cardiovascular: Negative for palpitations and leg swelling.  Gastrointestinal: Negative for nausea and vomiting.  Genitourinary: Negative for dysuria.  Musculoskeletal: Negative for joint swelling.  Skin: Negative for rash.  Allergic/Immunologic: Negative.  Negative for environmental allergies, food allergies and immunocompromised state.  Neurological: Negative for headaches.  Hematological: Does not bruise/bleed easily.  Psychiatric/Behavioral: Negative for dysphoric mood. The patient is nervous/anxious.    Past Medical History:  Diagnosis Date  . Anemia   . Anxiety   . Arthritis    "everywhere" (08/19/2013)  . Bilateral carpal tunnel syndrome   . Chest pain at rest, rule out pericarditis 11/27/2012  . CHF (congestive heart failure) ([place])   . Chronic kidney disease   . Chronic lower back pain   . Complication of anesthesia    "woke up twice during knee replacement" (08/19/2013)  . Depression   . Fibromyalgia   . Headache(784.0)    hx of migraines   . Heart murmur   . History of pericarditis, with pericardial window in 2009 11/27/2012  . Hx of melanoma of skin 11/27/2012  . Hx of ovarian cancer 11/27/2012  . Hypothyroidism 11/27/2012  . Iron deficiency anemia   . Kidney carcinoma ([place])    "both kidneys; ~ 3 yr apart" (08/19/2013)  . Melanoma of back ([place])   . Migraines   . Pneumonia    "several times; including today", RUL/notes 08/19/2013  . SOB (shortness of breath)    occasional with exertion   . Squamous carcinoma    "face, side of my nose, chest; used acid to get rid of them" (08/19/2013)  . Venous insufficiency 11/27/2012     Family History  Problem Relation Age of Onset  . Cancer Mother   . Cancer Father   . Bipolar disorder Sister      Social History   Social History  . Marital status: Married    Spouse name: N/A  . Number of children: N/A  . Years of education: N/A   Occupational History  . Not on file.   Social History Main Topics  . Smoking status: Former Smoker    Packs/day: 1.50    Years: 18.00  Types: Cigarettes    Quit date: 09/22/1985  . Smokeless tobacco: Never Used  . Alcohol use No  . Drug use: No     Comment: prescribed  . Sexual activity: No   Other Topics Concern  . Not on file   Social History Narrative   Married.  has worked in a Writer, dusts, fumes.  Has lived in [place] Reactions  . Amoxicillin     Has patient had a PCN reaction causing immediate rash, facial/tongue/throat swelling, SOB or lightheadedness with hypotension: No Has patient had a PCN reaction causing severe rash involving  mucus membranes or skin necrosis: No Has patient had a PCN reaction that required hospitalization No Has patient had a PCN reaction occurring within the last 10 years: No If all of the above answers are "NO", then may proceed with Cephalosporin use.  . Ampicillin Rash    Has patient had a PCN reaction causing immediate rash, facial/tongue/throat swelling, SOB or lightheadedness with hypotension: No Has patient had a PCN reaction causing severe rash involving mucus membranes or skin necrosis: No Has patient had a PCN reaction that required hospitalization No Has patient had a PCN reaction occurring within the last 10 years: No If all of the above answers are "NO", then may proceed with Cephalosporin use.     Outpatient Medications Prior to Visit  Medication Sig Dispense Refill  . atorvastatin (LIPITOR) 10 MG tablet Take 1 tablet (10 mg total) by mouth daily. 90 tablet 3  . azelastine (ASTELIN) 0.1 % nasal spray USE 2 SPRAYS IN BOTH NOSTRILS TWICE DAILY 30 mL 0  . buPROPion (WELLBUTRIN SR) 150 MG 12 hr tablet Take 1 tablet (150 mg total) by mouth daily. 30 tablet 11  . buPROPion (ZYBAN) 150 MG 12 hr tablet TAKE 1 TABLET BY MOUTH ONCE DAILY. 30 tablet 0  . ciclopirox (PENLAC) 8 % solution Apply topically at bedtime. Apply over nail and surrounding skin. Apply daily over previous coat. After seven (7) days, may remove with alcohol and continue cycle. 6.6 mL 2  . cyclobenzaprine (FLEXERIL) 5 MG tablet Take 1 tablet (5 mg total) by mouth 3 (three) times daily as needed. For spasms 30 tablet 0  . Diethylpropion HCl CR 75 MG TB24     . docusate sodium (COLACE) 100 MG capsule Take 1 capsule (100 mg total) by mouth 2 (two) times daily. 10 capsule 0  . l-methylfolate-B6-B12 (METANX) 3-35-2 MG TABS tablet Take 1 tablet by mouth 2 (two) times daily.    Marland Kitchen levothyroxine (SYNTHROID, LEVOTHROID) 100 MCG tablet Take 1 tab by mouth before breakfast M-F skip Sat and Sun 30 tablet 6  . morphine (MS CONTIN) 30 MG  12 hr tablet Take 30 mg by mouth every 8 (eight) hours.     . Multiple Vitamin (MULTIVITAMIN WITH MINERALS) TABS Take 1 tablet by mouth daily.    . polyethylene glycol (MIRALAX / GLYCOLAX) packet Take 17 g by mouth 2 (two) times daily. 14 each 0  . PROAIR HFA 108 (90 BASE) MCG/ACT inhaler INHALE 2 PUFFS EVERY 4 HOURS AS NEEDED FOR WHEEZING, COUGH OR SHORTNESS OF BREATH 8.5 g 3  . zolpidem (AMBIEN) 5 MG tablet TAKE 1 TABLET BY MOUTH AT BEDTIME AS NEEDED FOR SLEEP. 30 tablet 2  . acetaminophen (TYLENOL) 325 MG tablet Take 2 tablets (650 mg total) by mouth every 6 (six) hours as needed. (Patient not taking: Reported on 08/08/2017)    . metoprolol tartrate (LOPRESSOR) 25  MG tablet Take one half tablet twice a day (Patient not taking: Reported on 08/08/2017) 90 tablet 3  . mupirocin ointment (BACTROBAN) 2 % Apply small amount to the lesion on your right leg 3 times a day (Patient not taking: Reported on 08/08/2017) 30 g 0  . oxyCODONE 10 MG TABS Take 1-2 tablets (10-20 mg total) by mouth every 4 (four) hours as needed for severe pain. (Patient not taking: Reported on 08/08/2017) 100 tablet 0   No facility-administered medications prior to visit.         Objective:   Physical Exam Vitals:   08/08/17 1506  BP: 122/76  Pulse: 80  SpO2: 95%  Weight: 170 lb 9.6 oz (77.4 kg)  Height: 5\' 5"  (1.651 m)  Gen: Pleasant, well-nourished, in no distress,  normal affect  ENT: No lesions,  mouth clear,  oropharynx clear, no postnasal drip  Neck: No JVD, no TMG, no carotid bruits  Lungs: No use of accessory muscles, clear without rales or rhonchi  Cardiovascular: RRR, heart sounds normal, no murmur or gallops, no peripheral edema  Musculoskeletal: No deformities, no cyanosis or clubbing  Neuro: alert, non focal  Skin: Warm, no lesions or rashes     Assessment & Plan:  Pulmonary nodule, right 11 mm groundglass right upper lobe nodule that has enlarged on serial CT scans. Suspect that this is a  slow-growing adenocarcinoma of the lung. Pros and cons of bronchoscopy for tissue biopsy versus referral to thoracic surgery for primary resection. We will perform a PET scan, pulmonary function testing to assist [place] in the decision making. She will follow up after these tests to talk about the pros and cons of each approach. I have recommended against more serial scans given my suspicion that this is a primary malignancy.  Baltazar Apo, MD, PhD 08/08/2017, 3:52 PM [facility] [phone] or if no answer [phone]

## 2017-08-09 NOTE — Telephone Encounter (Signed)
Spoke with pharmacist an advised the change to new manufacturer was okay per [place].  Appreciative of call back. Dgaddy, CMA

## 2017-08-10 ENCOUNTER — Other Ambulatory Visit: Payer: Self-pay | Admitting: Family Medicine

## 2017-08-10 DIAGNOSIS — E0580 Other thyrotoxicosis without thyrotoxic crisis or storm: Secondary | ICD-10-CM

## 2017-08-13 NOTE — Telephone Encounter (Signed)
Please advise last seen in 12/2016.  Does she need a recheck f/u appt?

## 2017-08-15 NOTE — Telephone Encounter (Signed)
Please notify the patient that her last tsh was abnormal and so we need to recheck her level. She can come in for a lab only visit. And once the results are back I can send in more refills

## 2017-08-16 NOTE — Telephone Encounter (Signed)
Please send the notes from the office visit to Pulmonology.  Thank you.

## 2017-08-20 ENCOUNTER — Other Ambulatory Visit (HOSPITAL_COMMUNITY): Payer: Self-pay | Admitting: Adult Health

## 2017-08-20 ENCOUNTER — Ambulatory Visit (HOSPITAL_COMMUNITY)
Admission: RE | Admit: 2017-08-20 | Discharge: 2017-08-20 | Disposition: A | Discharge status: Home / Self Care | Payer: Medicare Other | Source: Ambulatory Visit | Attending: Adult Health | Admitting: Adult Health

## 2017-08-20 DIAGNOSIS — R911 Solitary pulmonary nodule: Secondary | ICD-10-CM | POA: Diagnosis not present

## 2017-08-20 DIAGNOSIS — Z85528 Personal history of other malignant neoplasm of kidney: Secondary | ICD-10-CM | POA: Insufficient documentation

## 2017-08-20 DIAGNOSIS — R0602 Shortness of breath: Secondary | ICD-10-CM | POA: Diagnosis not present

## 2017-08-20 DIAGNOSIS — D3000 Benign neoplasm of unspecified kidney: Secondary | ICD-10-CM | POA: Diagnosis not present

## 2017-08-20 DIAGNOSIS — I700 Atherosclerosis of aorta: Secondary | ICD-10-CM | POA: Diagnosis not present

## 2017-08-21 ENCOUNTER — Encounter (HOSPITAL_COMMUNITY)
Admission: RE | Admit: 2017-08-21 | Discharge: 2017-08-21 | Disposition: A | Discharge status: Home / Self Care | Payer: Medicare Other | Source: Ambulatory Visit | Attending: Emergency Medicine | Admitting: Emergency Medicine

## 2017-08-21 DIAGNOSIS — R911 Solitary pulmonary nodule: Secondary | ICD-10-CM

## 2017-08-21 LAB — GLUCOSE, CAPILLARY: Glucose-Capillary: 91 mg/dL (ref 65–99)

## 2017-08-21 MED ORDER — FLUDEOXYGLUCOSE F - 18 (FDG) INJECTION
8.2000 | Freq: Once | INTRAVENOUS | Status: AC | PRN
  Administered 2017-08-21: 8.2 via INTRAVENOUS

## 2017-08-24 ENCOUNTER — Other Ambulatory Visit (HOSPITAL_COMMUNITY): Payer: Self-pay | Admitting: Urology

## 2017-08-24 ENCOUNTER — Ambulatory Visit (INDEPENDENT_AMBULATORY_CARE_PROVIDER_SITE_OTHER): Payer: Medicare Other | Admitting: Emergency Medicine

## 2017-08-24 ENCOUNTER — Encounter: Payer: Self-pay | Admitting: Emergency Medicine

## 2017-08-24 VITALS — BP 110/78 | HR 84 | Ht 66.0 in | Wt 170.0 lb

## 2017-08-24 DIAGNOSIS — Z720 Tobacco use: Secondary | ICD-10-CM

## 2017-08-24 DIAGNOSIS — R911 Solitary pulmonary nodule: Secondary | ICD-10-CM | POA: Diagnosis not present

## 2017-08-24 DIAGNOSIS — C649 Malignant neoplasm of unspecified kidney, except renal pelvis: Secondary | ICD-10-CM | POA: Diagnosis not present

## 2017-08-24 DIAGNOSIS — D3000 Benign neoplasm of unspecified kidney: Secondary | ICD-10-CM

## 2017-08-24 DIAGNOSIS — D49512 Neoplasm of unspecified behavior of left kidney: Secondary | ICD-10-CM | POA: Diagnosis not present

## 2017-08-24 DIAGNOSIS — N281 Cyst of kidney, acquired: Secondary | ICD-10-CM | POA: Diagnosis not present

## 2017-08-24 LAB — PULMONARY FUNCTION TEST
DL/VA % pred: 84 %
DL/VA: 4.25 ml/min/mmHg/L
DLCO unc % pred: 69 %
DLCO unc: 18.63 ml/min/mmHg
FEF 25-75 Post: 3.51 L/sec
FEF 25-75 Pre: 3.32 L/sec
FEF2575-%Change-Post: 5 %
FEF2575-%Pred-Post: 166 %
FEF2575-%Pred-Pre: 157 %
FEV1-%Change-Post: 1 %
FEV1-%Pred-Post: 104 %
FEV1-%Pred-Pre: 103 %
FEV1-Post: 2.64 L
FEV1-Pre: 2.61 L
FEV1FVC-%Change-Post: 2 %
FEV1FVC-%Pred-Pre: 113 %
FEV6-%Change-Post: -1 %
FEV6-%Pred-Post: 93 %
FEV6-%Pred-Pre: 95 %
FEV6-Post: 2.97 L
FEV6-Pre: 3.02 L
FEV6FVC-%Pred-Post: 104 %
FEV6FVC-%Pred-Pre: 104 %
FVC-%Change-Post: -1 %
FVC-%Pred-Post: 89 %
FVC-%Pred-Pre: 91 %
FVC-Post: 2.97 L
FVC-Pre: 3.02 L
Post FEV1/FVC ratio: 89 %
Post FEV6/FVC ratio: 100 %
Pre FEV1/FVC ratio: 86 %
Pre FEV6/FVC Ratio: 100 %
RV % pred: 122 %
RV: 2.75 L
TLC % pred: 103 %
TLC: 5.51 L

## 2017-08-24 NOTE — Progress Notes (Signed)
PFT completed today.Malli Falotico,CMA  

## 2017-08-24 NOTE — Progress Notes (Signed)
Subjective:    Patient ID: [name], female    DOB: 03-Aug-1951, 66 y.o.   MRN: [id number]  HPI 66 year old former smoker (30 pack years) with arthritis, fibromyalgia, renal cell cancer, ovarian cancer, melanoma excision. She is referred today for evaluation of pulmonary nodule on CT scan of the chest.   A review of her CT scans of the chest shows that she has had a posterior right upper lobe nodule first identified on CT 04/29/14. I reviewed all of her scans including her most recent done 07/13/17. There has been an interval increase in size over that time. It now measures approximately 11 x 11 mm, has some groundglass and solid components. The report indicates there may be smaller sub-4 mm nodules present. I do not see any predominant small nodules  She has some dyspnea with a hill, able to walk on flat ground. Sometimes SOB with walking on flat ground. No cough. Some sinus drainage right now. No wheeze.   ROV 08/24/17 --patient has a history of renal cell carcinoma that has been excised, melanoma, fibromyalgia.  This is a follow-up visit for an 11 mm groundglass right upper lobe nodule that is enlarging on serial CT scans in a known smoker.  She underwent pulmonary function testing today to assess her potential for primary resection.  I have reviewed.  This shows normal airflows, normal lung volumes, decreased diffusion capacity that corrects to the normal range when adjusted for alveolar volume.  A PET scan was done on 08/21/17 that I have reviewed.  The PET scan shows  That the 63mm nodule has low level activity (SUV 1.9).  A CT scan of the abdomen done on 11/1 shows a cystic lesion on the left kidney that was suspicious.  It did not light up on the PET scan.  She is planning to have an MRI and to follow-up with urology.   Review of Systems  Constitutional: Negative for fever and unexpected weight change.  HENT: Positive for sinus pressure. Negative for congestion, dental problem, ear pain,  nosebleeds, postnasal drip, rhinorrhea, sneezing, sore throat and trouble swallowing.   Eyes: Negative for redness and itching.  Respiratory: Positive for shortness of breath. Negative for cough, chest tightness and wheezing.   Cardiovascular: Negative for palpitations and leg swelling.  Gastrointestinal: Negative for nausea and vomiting.  Genitourinary: Negative for dysuria.  Musculoskeletal: Negative for joint swelling.  Skin: Negative for rash.  Allergic/Immunologic: Negative.  Negative for environmental allergies, food allergies and immunocompromised state.  Neurological: Negative for headaches.  Hematological: Does not bruise/bleed easily.  Psychiatric/Behavioral: Negative for dysphoric mood. The patient is nervous/anxious.    Past Medical History:  Diagnosis Date  . Anemia   . Anxiety   . Arthritis    "everywhere" (08/19/2013)  . Bilateral carpal tunnel syndrome   . Chest pain at rest, rule out pericarditis 11/27/2012  . CHF (congestive heart failure) ([place])   . Chronic kidney disease   . Chronic lower back pain   . Complication of anesthesia    "woke up twice during knee replacement" (08/19/2013)  . Depression   . Fibromyalgia   . Headache(784.0)    hx of migraines   . Heart murmur   . History of pericarditis, with pericardial window in 2009 11/27/2012  . Hx of melanoma of skin 11/27/2012  . Hx of ovarian cancer 11/27/2012  . Hypothyroidism 11/27/2012  . Iron deficiency anemia   . Kidney carcinoma ([place])    "both kidneys; ~ 3 yr  apart" (08/19/2013)  . Melanoma of back ([place])   . Migraines   . Pneumonia    "several times; including today", RUL/notes 08/19/2013  . SOB (shortness of breath)    occasional with exertion   . Squamous carcinoma    "face, side of my nose, chest; used acid to get rid of them" (08/19/2013)  . Venous insufficiency 11/27/2012     Family History  Problem Relation Age of Onset  . Cancer Mother   . Cancer Father   . Bipolar disorder Sister       Social History   Social History  . Marital status: Married    Spouse name: N/A  . Number of children: N/A  . Years of education: N/A   Occupational History  . Not on file.   Social History Main Topics  . Smoking status: Former Smoker    Packs/day: 1.50    Years: 18.00    Types: Cigarettes    Quit date: 09/22/1985  . Smokeless tobacco: Never Used  . Alcohol use No  . Drug use: No     Comment: prescribed  . Sexual activity: No   Other Topics Concern  . Not on file   Social History Narrative   Married.  has worked in a Writer, dusts, fumes.  Has lived in [place] Reactions  . Amoxicillin     Has patient had a PCN reaction causing immediate rash, facial/tongue/throat swelling, SOB or lightheadedness with hypotension: No Has patient had a PCN reaction causing severe rash involving mucus membranes or skin necrosis: No Has patient had a PCN reaction that required hospitalization No Has patient had a PCN reaction occurring within the last 10 years: No If all of the above answers are "NO", then may proceed with Cephalosporin use.  . Ampicillin Rash    Has patient had a PCN reaction causing immediate rash, facial/tongue/throat swelling, SOB or lightheadedness with hypotension: No Has patient had a PCN reaction causing severe rash involving mucus membranes or skin necrosis: No Has patient had a PCN reaction that required hospitalization No Has patient had a PCN reaction occurring within the last 10 years: No If all of the above answers are "NO", then may proceed with Cephalosporin use.     Outpatient Medications Prior to Visit  Medication Sig Dispense Refill  . atorvastatin (LIPITOR) 10 MG tablet Take 1 tablet (10 mg total) by mouth daily. 90 tablet 3  . azelastine (ASTELIN) 0.1 % nasal spray USE 2 SPRAYS IN BOTH NOSTRILS TWICE DAILY 30 mL 0  . buPROPion (WELLBUTRIN SR) 150 MG 12 hr tablet Take 1 tablet (150 mg total) by mouth daily. 30 tablet 11  .  buPROPion (ZYBAN) 150 MG 12 hr tablet TAKE 1 TABLET BY MOUTH ONCE DAILY. 30 tablet 0  . ciclopirox (PENLAC) 8 % solution Apply topically at bedtime. Apply over nail and surrounding skin. Apply daily over previous coat. After seven (7) days, may remove with alcohol and continue cycle. 6.6 mL 2  . cyclobenzaprine (FLEXERIL) 5 MG tablet Take 1 tablet (5 mg total) by mouth 3 (three) times daily as needed. For spasms 30 tablet 0  . Diethylpropion HCl CR 75 MG TB24     . docusate sodium (COLACE) 100 MG capsule Take 1 capsule (100 mg total) by mouth 2 (two) times daily. 10 capsule 0  . l-methylfolate-B6-B12 (METANX) 3-35-2 MG TABS tablet Take 1 tablet by mouth 2 (two) times daily.    Marland Kitchen levothyroxine (SYNTHROID, LEVOTHROID)  100 MCG tablet TAKE 1 TABLET BY MOUTH BEFORE BREAKFAST MONDAY THRU FRIDAY, SKIP SATURDAY AND SUNDAY 30 tablet 0  . morphine (MS CONTIN) 30 MG 12 hr tablet Take 30 mg by mouth every 8 (eight) hours.     . Multiple Vitamin (MULTIVITAMIN WITH MINERALS) TABS Take 1 tablet by mouth daily.    . polyethylene glycol (MIRALAX / GLYCOLAX) packet Take 17 g by mouth 2 (two) times daily. 14 each 0  . PROAIR HFA 108 (90 BASE) MCG/ACT inhaler INHALE 2 PUFFS EVERY 4 HOURS AS NEEDED FOR WHEEZING, COUGH OR SHORTNESS OF BREATH 8.5 g 3  . zolpidem (AMBIEN) 5 MG tablet TAKE 1 TABLET BY MOUTH AT BEDTIME AS NEEDED FOR SLEEP. 30 tablet 2   No facility-administered medications prior to visit.         Objective:   Physical Exam Vitals:   08/24/17 1504  BP: 110/78  Pulse: 84  SpO2: 95%  Weight: 170 lb (77.1 kg)  Height: 5\' 6"  (1.676 m)  Gen: Pleasant, well-nourished, in no distress,  normal affect  ENT: No lesions,  mouth clear,  oropharynx clear, no postnasal drip  Neck: No JVD, no TMG, no carotid bruits  Lungs: No use of accessory muscles, clear without rales or rhonchi  Cardiovascular: RRR, heart sounds normal, no murmur or gallops, no peripheral edema  Musculoskeletal: No deformities, no  cyanosis or clubbing  Neuro: alert, non focal  Skin: Warm, no lesions or rashes     Assessment & Plan:  Pulmonary nodule, right No barriers to possible primary resection of a right upper lobe nodule based on pulmonary function testing and her PET scan.  Unfortunately she did have a recent identification of the left kidney cystic lesion that needs more workup.  With her history of renal cell carcinoma this evaluation will likely influence the timing or potential for a primary lung surgery.  I will refer her to thoracic surgery to initiate the process but suspect that the urology workup will need to happen before they will schedule her.  Baltazar Apo, MD, PhD 08/24/2017, 3:27 PM [facility] [phone] or if no answer [phone]

## 2017-08-24 NOTE — Patient Instructions (Signed)
We will refer you to cardiothoracic surgery to consider resection of your pulmonary nodule.  It is consistent with a slow-growing primary lung cancer. Prior to any lung procedure you will need to continue the workup and evaluation of your cystic lesion on the kidney, including MRI and any urology follow-up planned. Follow with Dr Lamonte Sakai in 2 months or sooner if you have any problems.

## 2017-08-24 NOTE — Assessment & Plan Note (Signed)
No barriers to possible primary resection of a right upper lobe nodule based on pulmonary function testing and her PET scan.  Unfortunately she did have a recent identification of the left kidney cystic lesion that needs more workup.  With her history of renal cell carcinoma this evaluation will likely influence the timing or potential for a primary lung surgery.  I will refer her to thoracic surgery to initiate the process but suspect that the urology workup will need to happen before they will schedule her.

## 2017-08-28 ENCOUNTER — Ambulatory Visit (HOSPITAL_COMMUNITY)
Admission: RE | Admit: 2017-08-28 | Discharge: 2017-08-28 | Disposition: A | Discharge status: Home / Self Care | Payer: Medicare Other | Source: Ambulatory Visit | Attending: Urology | Admitting: Urology

## 2017-08-28 DIAGNOSIS — N281 Cyst of kidney, acquired: Secondary | ICD-10-CM | POA: Insufficient documentation

## 2017-08-28 DIAGNOSIS — M47896 Other spondylosis, lumbar region: Secondary | ICD-10-CM | POA: Diagnosis not present

## 2017-08-28 DIAGNOSIS — I700 Atherosclerosis of aorta: Secondary | ICD-10-CM | POA: Diagnosis not present

## 2017-08-28 DIAGNOSIS — Z9889 Other specified postprocedural states: Secondary | ICD-10-CM | POA: Insufficient documentation

## 2017-08-28 DIAGNOSIS — D3000 Benign neoplasm of unspecified kidney: Secondary | ICD-10-CM | POA: Diagnosis not present

## 2017-08-28 DIAGNOSIS — M5136 Other intervertebral disc degeneration, lumbar region: Secondary | ICD-10-CM | POA: Insufficient documentation

## 2017-08-28 LAB — POCT I-STAT CREATININE: Creatinine, Ser: 1.4 mg/dL — ABNORMAL HIGH (ref 0.44–1.00)

## 2017-08-28 MED ORDER — GADOBENATE DIMEGLUMINE 529 MG/ML IV SOLN
20.0000 mL | Freq: Once | INTRAVENOUS | Status: AC | PRN
  Administered 2017-08-28: 8 mL via INTRAVENOUS

## 2017-08-28 NOTE — Telephone Encounter (Signed)
Spoke with pt advised TSH levels last time were abnormal and need to be rechecked.  Advised to come by office for lab visit only for blood draw and as soon as results are in Dr. Nolon Rod will be able to send in refills on levothyroxine 100 mcg.  Advised levels need to be checked periodically as adjusting to medication may be needed.  Pt agreeable and verbalizes understanding.

## 2017-08-29 ENCOUNTER — Institutional Professional Consult (permissible substitution) (INDEPENDENT_AMBULATORY_CARE_PROVIDER_SITE_OTHER): Payer: Medicare Other | Admitting: Thoracic Surgery (Cardiothoracic Vascular Surgery)

## 2017-08-29 ENCOUNTER — Encounter: Payer: Self-pay | Admitting: Thoracic Surgery (Cardiothoracic Vascular Surgery)

## 2017-08-29 ENCOUNTER — Other Ambulatory Visit: Payer: Self-pay

## 2017-08-29 VITALS — BP 114/75 | HR 86 | Ht 66.0 in | Wt 168.0 lb

## 2017-08-29 DIAGNOSIS — R911 Solitary pulmonary nodule: Secondary | ICD-10-CM | POA: Diagnosis not present

## 2017-08-29 NOTE — H&P (View-Only) (Signed)
PCP is Forrest Moron, MD Referring Provider is Collene Gobble, MD  Chief Complaint  Patient presents with  . New Patient (Initial Visit)    right upper lobe pulmonary nodule, CT Chest 07/16/2017, PET 08/21/2017    HPI: Mrs. [name] sent for consultation regarding right upper lobe lung nodule.  Mrs. Contee is a 66 year old woman with a history of tobacco abuse (30 pack years although she did quit 31 years ago).  Past medical history is significant for depression, anxiety, congestive heart failure, subxiphoid pericardial window, melanoma, renal cell carcinoma, ovarian cancer, anemia, hypothyroidism, arthritis, previous joint replacement and back surgery, chronic pain, fibromyalgia, chronic kidney disease, and venous insufficiency.  Is been followed for some time for a right upper lobe lung nodule.  She is not sure how this was initially found.  It had previously been 6 mm and relatively stable.  She recently had a repeat CT which showed the nodules increased in size to 11 mm.  She was referred to Dr. Lamonte Sakai.  Appears to have both groundglass and solid components.  He recommended a PET/CT which showed the nodule was mildly hypermetabolic with an SUV of 1.9.  She is now referred for consideration for surgical resection.  She says that she gets short of breath if she walks up an incline, but can walk a good distance on level ground.  She does not have any chest pain, pressure, or tightness.  She denies headaches or visual changes.  She does suffer from chronic pain in her back and joints.  She is on MS Contin 30 mg every 8.  She denies any change in appetite.  She has lost weight after going on weight watchers.  She is has lost 21 pounds over the past 6 months and 3 pounds over the past 3 months. Zubrod Score: At the time of surgery this patient's most appropriate activity status/level should be described as: []     0    Normal activity, no symptoms [x]     1    Restricted in physical strenuous activity  but ambulatory, able to do out light work []     2    Ambulatory and capable of self care, unable to do work activities, up and about >50 % of waking hours                              []     3    Only limited self care, in bed greater than 50% of waking hours []     4    Completely disabled, no self care, confined to bed or chair []     5    Moribund   Past Medical History:  Diagnosis Date  . Anemia   . Anxiety   . Arthritis    "everywhere" (08/19/2013)  . Bilateral carpal tunnel syndrome   . Chest pain at rest, rule out pericarditis 11/27/2012  . CHF (congestive heart failure) ([place])   . Chronic kidney disease   . Chronic lower back pain   . Complication of anesthesia    "woke up twice during knee replacement" (08/19/2013)  . Depression   . Fibromyalgia   . Headache(784.0)    hx of migraines   . Heart murmur   . History of pericarditis, with pericardial window in 2009 11/27/2012  . Hx of melanoma of skin 11/27/2012  . Hx of ovarian cancer 11/27/2012  . Hypothyroidism 11/27/2012  . Iron deficiency anemia   .  Kidney carcinoma ([place])    "both kidneys; ~ 3 yr apart" (08/19/2013)  . Melanoma of back ([place])   . Migraines   . Pneumonia    "several times; including today", RUL/notes 08/19/2013  . SOB (shortness of breath)    occasional with exertion   . Squamous carcinoma    "face, side of my nose, chest; used acid to get rid of them" (08/19/2013)  . Venous insufficiency 11/27/2012    Past Surgical History:  Procedure Laterality Date  . ABDOMINAL HYSTERECTOMY  1979  . APPENDECTOMY    . DOPPLER ECHOCARDIOGRAPHY  02/23/2011   LVEF>50%  . FRACTURE SURGERY    . JOINT REPLACEMENT    . KNEE ARTHROSCOPY Right    "twice before the replacement" (08/19/2013)  . LEFT OOPHORECTOMY Left 1979  . [place]   "ruptured disc"  . MELANOMA EXCISION  ~ 2010   "my lower back" (08/19/2013)  . PARTIAL NEPHRECTOMY Right 2005; ~ 2008  . PERICARDIAL WINDOW  ~ 2012  . POSTERIOR LUMBAR FUSION   2002?; 03/2012  . REDUCTION MAMMAPLASTY Bilateral ~ 2008  . RIGHT OOPHORECTOMY  ~ 2007   "it had cancer in it" (08/19/2013)  . SHOULDER ARTHROSCOPY W/ ROTATOR CUFF REPAIR Right 1990's  . TONSILLECTOMY  1954  . TOTAL KNEE ARTHROPLASTY Right 1990's  . TOTAL SHOULDER REPLACEMENT Left 2006?  . TUBAL LIGATION  1952    Family History  Problem Relation Age of Onset  . Cancer Mother   . Cancer Father   . Bipolar disorder Sister     Social History Social History   Tobacco Use  . Smoking status: Former Smoker    Packs/day: 1.50    Years: 18.00    Pack years: 27.00    Types: Cigarettes    Last attempt to quit: 09/22/1985    Years since quitting: 31.9  . Smokeless tobacco: Never Used  Substance Use Topics  . Alcohol use: No  . Drug use: No    Comment: prescribed    Current Outpatient Medications  Medication Sig Dispense Refill  . atorvastatin (LIPITOR) 10 MG tablet Take 1 tablet (10 mg total) by mouth daily. 90 tablet 3  . azelastine (ASTELIN) 0.1 % nasal spray USE 2 SPRAYS IN BOTH NOSTRILS TWICE DAILY 30 mL 0  . buPROPion (WELLBUTRIN SR) 150 MG 12 hr tablet Take 1 tablet (150 mg total) by mouth daily. 30 tablet 11  . buPROPion (ZYBAN) 150 MG 12 hr tablet TAKE 1 TABLET BY MOUTH ONCE DAILY. 30 tablet 0  . ciclopirox (PENLAC) 8 % solution Apply topically at bedtime. Apply over nail and surrounding skin. Apply daily over previous coat. After seven (7) days, may remove with alcohol and continue cycle. 6.6 mL 2  . cyclobenzaprine (FLEXERIL) 5 MG tablet Take 1 tablet (5 mg total) by mouth 3 (three) times daily as needed. For spasms 30 tablet 0  . Diethylpropion HCl CR 75 MG TB24     . docusate sodium (COLACE) 100 MG capsule Take 1 capsule (100 mg total) by mouth 2 (two) times daily. 10 capsule 0  . l-methylfolate-B6-B12 (METANX) 3-35-2 MG TABS tablet Take 1 tablet by mouth 2 (two) times daily.    Marland Kitchen levothyroxine (SYNTHROID, LEVOTHROID) 100 MCG tablet TAKE 1 TABLET BY MOUTH BEFORE BREAKFAST  MONDAY THRU FRIDAY, SKIP SATURDAY AND SUNDAY 30 tablet 0  . morphine (MS CONTIN) 30 MG 12 hr tablet Take 30 mg by mouth every 8 (eight) hours.     Marland Kitchen  Multiple Vitamin (MULTIVITAMIN WITH MINERALS) TABS Take 1 tablet by mouth daily.    . polyethylene glycol (MIRALAX / GLYCOLAX) packet Take 17 g by mouth 2 (two) times daily. 14 each 0  . PROAIR HFA 108 (90 BASE) MCG/ACT inhaler INHALE 2 PUFFS EVERY 4 HOURS AS NEEDED FOR WHEEZING, COUGH OR SHORTNESS OF BREATH 8.5 g 3  . zolpidem (AMBIEN) 5 MG tablet TAKE 1 TABLET BY MOUTH AT BEDTIME AS NEEDED FOR SLEEP. 30 tablet 2   No current facility-administered medications for this visit.     Allergies  Allergen Reactions  . Amoxicillin     Has patient had a PCN reaction causing immediate rash, facial/tongue/throat swelling, SOB or lightheadedness with hypotension: No Has patient had a PCN reaction causing severe rash involving mucus membranes or skin necrosis: No Has patient had a PCN reaction that required hospitalization No Has patient had a PCN reaction occurring within the last 10 years: No If all of the above answers are "NO", then may proceed with Cephalosporin use.  . Ampicillin Rash    Has patient had a PCN reaction causing immediate rash, facial/tongue/throat swelling, SOB or lightheadedness with hypotension: No Has patient had a PCN reaction causing severe rash involving mucus membranes or skin necrosis: No Has patient had a PCN reaction that required hospitalization No Has patient had a PCN reaction occurring within the last 10 years: No If all of the above answers are "NO", then may proceed with Cephalosporin use.    Review of Systems  Constitutional: Negative for activity change, appetite change and unexpected weight change (Has lost 3 pounds over past 3 months and 21 pounds over past 6 months with diet).  HENT: Negative for trouble swallowing and voice change.   Respiratory: Negative for cough, shortness of breath and wheezing.    Cardiovascular: Negative for chest pain and leg swelling.  Gastrointestinal: Negative for abdominal pain and blood in stool.  Genitourinary: Negative for difficulty urinating and dysuria.       History nephrectomy for renal cell carcinoma, kidney disease  Musculoskeletal: Positive for arthralgias, back pain, joint swelling and myalgias.  Neurological: Positive for numbness. Negative for syncope and weakness.  Hematological: Negative for adenopathy. Bruises/bleeds easily.  Psychiatric/Behavioral: Positive for dysphoric mood.       Chronic pain on MS Contin  All other systems reviewed and are negative.   BP 114/75   Pulse 86   Ht 5\' 6"  (1.676 m)   Wt 168 lb (76.2 kg)   SpO2 95%   BMI 27.12 kg/m  Physical Exam  Constitutional: She is oriented to person, place, and time. She appears well-developed and well-nourished. No distress.  HENT:  Head: Normocephalic and atraumatic.  Mouth/Throat: No oropharyngeal exudate.  Eyes: Conjunctivae and EOM are normal. No scleral icterus.  Neck: Neck supple. No thyromegaly present.  Cardiovascular: Normal rate and regular rhythm. Exam reveals no gallop and no friction rub.  Murmur (2/6 systolic) heard. Pulmonary/Chest: Effort normal. No respiratory distress. She has no wheezes. She has no rales.  Abdominal: Soft. She exhibits no distension. There is no tenderness.  Musculoskeletal: She exhibits no edema.  Lymphadenopathy:    She has no cervical adenopathy.  Neurological: She is alert and oriented to person, place, and time. No cranial nerve deficit. She exhibits normal muscle tone.  Skin: Skin is warm and dry.  Vitals reviewed.    Diagnostic Tests: CT CHEST WITHOUT CONTRAST  TECHNIQUE: Multidetector CT imaging of the chest was performed following the standard protocol without  IV contrast.  COMPARISON:  04/29/2014 and 11/27/2012  FINDINGS: Mediastinum/Lymph Nodes: No masses or pathologically enlarged lymph nodes  identified.  Lungs/Pleura: A 6 mm pulmonary nodule is again seen in the posterior right upper lobe on image 20 which is stable compared to previous studies dating back to 11/27/2012. Two adjacent 3 mm peripheral pulmonary nodules are also noted, and are stable. No new or enlarging pulmonary nodules or masses identified. No evidence of acute infiltrate.  Musculoskeletal/Soft Tissues: No suspicious bone lesions or other significant chest wall abnormality.  Upper Abdomen:  Unremarkable.  IMPRESSION: Stable subcentimeter right upper lobe pulmonary nodules dating back to prior chest CT on 11/27/2012, consistent with benign etiology. No other significant abnormality identified.   Electronically Signed   By: Earle Gell M.D.   On: 11/02/2014 15:30 NUCLEAR MEDICINE PET SKULL BASE TO THIGH  TECHNIQUE: 8.2 mCi F-18 FDG was injected intravenously. Full-ring PET imaging was performed from the skull base to thigh after the radiotracer. CT data was obtained and used for attenuation correction and anatomic localization.  FASTING BLOOD GLUCOSE:  Value: 91 mg/dl  COMPARISON:  CT on 07/13/2017 and 11/02/2014  FINDINGS: NECK:  No hypermetabolic lymph nodes or masses.  CHEST: No hypermetabolic lymphadenopathy. 11 mm ground-glass nodule in posterior right upper lobe shows low-grade FDG uptake with SUV max of 1.9. This nodule shows enlargement since 2016 exam, and is suspicious for low-grade adenocarcinoma. No other suspicious pulmonary nodules seen on CT images. Aortic and coronary artery atherosclerosis.  ABDOMEN/PELVIS: No abnormal hypermetabolic activity within the liver, pancreas, adrenal glands, or spleen. No hypermetabolic lymph nodes in the abdomen or pelvis. Prior hysterectomy. Aortic atherosclerosis.  SKELETON: No suspicious appearing hypermetabolic bone lesions identified . FDG uptake is seen at sites of healing/subacute fractures involving the right anterior  third, fourth, and fifth ribs.  IMPRESSION: 11 mm ground-glass nodule in posterior right upper lobe shows low-grade FDG uptake, and is suspicious for low-grade adenocarcinoma.  No evidence of metastatic disease.   Electronically Signed   By: Earle Gell M.D.   On: 08/21/2017 11:56 I personally reviewed the CT and PET/CT images and concur with the findings noted above  Pulmonary function testing FVC 3.02 (91%) FEV1 2.61 (103%) DLCO 18.63 (69%)  Impression: Mrs. Keng is a 66 year old woman with a complicated past medical history including renal cell carcinoma, ovarian cancer, melanoma, tobacco abuse, arthritis, fibromyalgia, chronic pain, chronic opioid use, depression, anxiety, and congestive heart failure.  She has been followed for a small groundglass opacity in the right upper lobe.  This has now grown to 11 mm and is mildly hypermetabolic on PET/CT.  This is highly suspicious for a low-grade adenocarcinoma.  Infectious or inflammatory etiologies are also in the differential but far less likely.  I discussed several potential options with Mrs. Omahoney.  One option would be continued radiographic follow-up.  I would advise against that given the high index of suspicion.  Second would be attempt at CT-guided or bronchoscopic biopsy.  I would only recommend that if she were to refuse surgery and went to have stereotactic radiation.  The final and best option is surgical resection for definitive diagnosis and treatment.  My primary concern in her case with surgical resection is her risk for chronic pain given her history.  I described general nature of the procedure including the incisions to be used, the need for general anesthesia, the use of drainage tubes postoperatively, the expected hospital stay, and the overall recovery.  I informed her of the  indications, risks, benefits, and alternatives.  She understands the risks include, but not limited to death, MI, DVT, PE, stroke,  bleeding, possible need for transfusion, infection, prolonged air leak, cardiac arrhythmias, chronic pain, as well as possibility of other unforeseeable complications.  She is interested in proceeding with surgery, but does not want to commit to a definitive time yet.  She had an MR of the abdomen to evaluate the questionable renal finding on her CT.  She meets with urology tomorrow to see if any further workup is needed regarding that.  If there is a high index of suspicion that it is malignant, it would be reasonable to evaluate that prior to the slow-growing lung nodule.  Plan: She will see urology tomorrow regarding the findings on her MR abdomen.  She will let us know if she would like to proceed with right VATS for wedge resection and possible segmentectomy or lobectomy.  Melrose Nakayama, MD [facility] [phone]

## 2017-08-29 NOTE — Progress Notes (Signed)
PCP is Forrest Moron, MD Referring Provider is Collene Gobble, MD  Chief Complaint  Patient presents with  . New Patient (Initial Visit)    right upper lobe pulmonary nodule, CT Chest 07/16/2017, PET 08/21/2017    HPI: Mrs. Mandella sent for consultation regarding right upper lobe lung nodule.  Mrs. Rayburn is a 66 year old woman with a history of tobacco abuse (30 pack years although she did quit 31 years ago).  Past medical history is significant for depression, anxiety, congestive heart failure, subxiphoid pericardial window, melanoma, renal cell carcinoma, ovarian cancer, anemia, hypothyroidism, arthritis, previous joint replacement and back surgery, chronic pain, fibromyalgia, chronic kidney disease, and venous insufficiency.  Is been followed for some time for a right upper lobe lung nodule.  She is not sure how this was initially found.  It had previously been 6 mm and relatively stable.  She recently had a repeat CT which showed the nodules increased in size to 11 mm.  She was referred to Dr. Lamonte Sakai.  Appears to have both groundglass and solid components.  He recommended a PET/CT which showed the nodule was mildly hypermetabolic with an SUV of 1.9.  She is now referred for consideration for surgical resection.  She says that she gets short of breath if she walks up an incline, but can walk a good distance on level ground.  She does not have any chest pain, pressure, or tightness.  She denies headaches or visual changes.  She does suffer from chronic pain in her back and joints.  She is on MS Contin 30 mg every 8.  She denies any change in appetite.  She has lost weight after going on weight watchers.  She is has lost 21 pounds over the past 6 months and 3 pounds over the past 3 months. Zubrod Score: At the time of surgery this patient's most appropriate activity status/level should be described as: []     0    Normal activity, no symptoms [x]     1    Restricted in physical strenuous activity  but ambulatory, able to do out light work []     2    Ambulatory and capable of self care, unable to do work activities, up and about >50 % of waking hours                              []     3    Only limited self care, in bed greater than 50% of waking hours []     4    Completely disabled, no self care, confined to bed or chair []     5    Moribund   Past Medical History:  Diagnosis Date  . Anemia   . Anxiety   . Arthritis    "everywhere" (08/19/2013)  . Bilateral carpal tunnel syndrome   . Chest pain at rest, rule out pericarditis 11/27/2012  . CHF (congestive heart failure) ([place])   . Chronic kidney disease   . Chronic lower back pain   . Complication of anesthesia    "woke up twice during knee replacement" (08/19/2013)  . Depression   . Fibromyalgia   . Headache(784.0)    hx of migraines   . Heart murmur   . History of pericarditis, with pericardial window in 2009 11/27/2012  . Hx of melanoma of skin 11/27/2012  . Hx of ovarian cancer 11/27/2012  . Hypothyroidism 11/27/2012  . Iron deficiency anemia   .  Kidney carcinoma ([place])    "both kidneys; ~ 3 yr apart" (08/19/2013)  . Melanoma of back ([place])   . Migraines   . Pneumonia    "several times; including today", RUL/notes 08/19/2013  . SOB (shortness of breath)    occasional with exertion   . Squamous carcinoma    "face, side of my nose, chest; used acid to get rid of them" (08/19/2013)  . Venous insufficiency 11/27/2012    Past Surgical History:  Procedure Laterality Date  . ABDOMINAL HYSTERECTOMY  1979  . APPENDECTOMY    . DOPPLER ECHOCARDIOGRAPHY  02/23/2011   LVEF>50%  . FRACTURE SURGERY    . JOINT REPLACEMENT    . KNEE ARTHROSCOPY Right    "twice before the replacement" (08/19/2013)  . LEFT OOPHORECTOMY Left 1979  . [place]   "ruptured disc"  . MELANOMA EXCISION  ~ 2010   "my lower back" (08/19/2013)  . PARTIAL NEPHRECTOMY Right 2005; ~ 2008  . PERICARDIAL WINDOW  ~ 2012  . POSTERIOR LUMBAR FUSION   2002?; 03/2012  . REDUCTION MAMMAPLASTY Bilateral ~ 2008  . RIGHT OOPHORECTOMY  ~ 2007   "it had cancer in it" (08/19/2013)  . SHOULDER ARTHROSCOPY W/ ROTATOR CUFF REPAIR Right 1990's  . TONSILLECTOMY  1954  . TOTAL KNEE ARTHROPLASTY Right 1990's  . TOTAL SHOULDER REPLACEMENT Left 2006?  . TUBAL LIGATION  1952    Family History  Problem Relation Age of Onset  . Cancer Mother   . Cancer Father   . Bipolar disorder Sister     Social History Social History   Tobacco Use  . Smoking status: Former Smoker    Packs/day: 1.50    Years: 18.00    Pack years: 27.00    Types: Cigarettes    Last attempt to quit: 09/22/1985    Years since quitting: 31.9  . Smokeless tobacco: Never Used  Substance Use Topics  . Alcohol use: No  . Drug use: No    Comment: prescribed    Current Outpatient Medications  Medication Sig Dispense Refill  . atorvastatin (LIPITOR) 10 MG tablet Take 1 tablet (10 mg total) by mouth daily. 90 tablet 3  . azelastine (ASTELIN) 0.1 % nasal spray USE 2 SPRAYS IN BOTH NOSTRILS TWICE DAILY 30 mL 0  . buPROPion (WELLBUTRIN SR) 150 MG 12 hr tablet Take 1 tablet (150 mg total) by mouth daily. 30 tablet 11  . buPROPion (ZYBAN) 150 MG 12 hr tablet TAKE 1 TABLET BY MOUTH ONCE DAILY. 30 tablet 0  . ciclopirox (PENLAC) 8 % solution Apply topically at bedtime. Apply over nail and surrounding skin. Apply daily over previous coat. After seven (7) days, may remove with alcohol and continue cycle. 6.6 mL 2  . cyclobenzaprine (FLEXERIL) 5 MG tablet Take 1 tablet (5 mg total) by mouth 3 (three) times daily as needed. For spasms 30 tablet 0  . Diethylpropion HCl CR 75 MG TB24     . docusate sodium (COLACE) 100 MG capsule Take 1 capsule (100 mg total) by mouth 2 (two) times daily. 10 capsule 0  . l-methylfolate-B6-B12 (METANX) 3-35-2 MG TABS tablet Take 1 tablet by mouth 2 (two) times daily.    Marland Kitchen levothyroxine (SYNTHROID, LEVOTHROID) 100 MCG tablet TAKE 1 TABLET BY MOUTH BEFORE BREAKFAST  MONDAY THRU FRIDAY, SKIP SATURDAY AND SUNDAY 30 tablet 0  . morphine (MS CONTIN) 30 MG 12 hr tablet Take 30 mg by mouth every 8 (eight) hours.     Marland Kitchen  Multiple Vitamin (MULTIVITAMIN WITH MINERALS) TABS Take 1 tablet by mouth daily.    . polyethylene glycol (MIRALAX / GLYCOLAX) packet Take 17 g by mouth 2 (two) times daily. 14 each 0  . PROAIR HFA 108 (90 BASE) MCG/ACT inhaler INHALE 2 PUFFS EVERY 4 HOURS AS NEEDED FOR WHEEZING, COUGH OR SHORTNESS OF BREATH 8.5 g 3  . zolpidem (AMBIEN) 5 MG tablet TAKE 1 TABLET BY MOUTH AT BEDTIME AS NEEDED FOR SLEEP. 30 tablet 2   No current facility-administered medications for this visit.     Allergies  Allergen Reactions  . Amoxicillin     Has patient had a PCN reaction causing immediate rash, facial/tongue/throat swelling, SOB or lightheadedness with hypotension: No Has patient had a PCN reaction causing severe rash involving mucus membranes or skin necrosis: No Has patient had a PCN reaction that required hospitalization No Has patient had a PCN reaction occurring within the last 10 years: No If all of the above answers are "NO", then may proceed with Cephalosporin use.  . Ampicillin Rash    Has patient had a PCN reaction causing immediate rash, facial/tongue/throat swelling, SOB or lightheadedness with hypotension: No Has patient had a PCN reaction causing severe rash involving mucus membranes or skin necrosis: No Has patient had a PCN reaction that required hospitalization No Has patient had a PCN reaction occurring within the last 10 years: No If all of the above answers are "NO", then may proceed with Cephalosporin use.    Review of Systems  Constitutional: Negative for activity change, appetite change and unexpected weight change (Has lost 3 pounds over past 3 months and 21 pounds over past 6 months with diet).  HENT: Negative for trouble swallowing and voice change.   Respiratory: Negative for cough, shortness of breath and wheezing.    Cardiovascular: Negative for chest pain and leg swelling.  Gastrointestinal: Negative for abdominal pain and blood in stool.  Genitourinary: Negative for difficulty urinating and dysuria.       History nephrectomy for renal cell carcinoma, kidney disease  Musculoskeletal: Positive for arthralgias, back pain, joint swelling and myalgias.  Neurological: Positive for numbness. Negative for syncope and weakness.  Hematological: Negative for adenopathy. Bruises/bleeds easily.  Psychiatric/Behavioral: Positive for dysphoric mood.       Chronic pain on MS Contin  All other systems reviewed and are negative.   BP 114/75   Pulse 86   Ht 5\' 6"  (1.676 m)   Wt 168 lb (76.2 kg)   SpO2 95%   BMI 27.12 kg/m  Physical Exam  Constitutional: She is oriented to person, place, and time. She appears well-developed and well-nourished. No distress.  HENT:  Head: Normocephalic and atraumatic.  Mouth/Throat: No oropharyngeal exudate.  Eyes: Conjunctivae and EOM are normal. No scleral icterus.  Neck: Neck supple. No thyromegaly present.  Cardiovascular: Normal rate and regular rhythm. Exam reveals no gallop and no friction rub.  Murmur (2/6 systolic) heard. Pulmonary/Chest: Effort normal. No respiratory distress. She has no wheezes. She has no rales.  Abdominal: Soft. She exhibits no distension. There is no tenderness.  Musculoskeletal: She exhibits no edema.  Lymphadenopathy:    She has no cervical adenopathy.  Neurological: She is alert and oriented to person, place, and time. No cranial nerve deficit. She exhibits normal muscle tone.  Skin: Skin is warm and dry.  Vitals reviewed.    Diagnostic Tests: CT CHEST WITHOUT CONTRAST  TECHNIQUE: Multidetector CT imaging of the chest was performed following the standard protocol without  IV contrast.  COMPARISON:  04/29/2014 and 11/27/2012  FINDINGS: Mediastinum/Lymph Nodes: No masses or pathologically enlarged lymph nodes  identified.  Lungs/Pleura: A 6 mm pulmonary nodule is again seen in the posterior right upper lobe on image 20 which is stable compared to previous studies dating back to 11/27/2012. Two adjacent 3 mm peripheral pulmonary nodules are also noted, and are stable. No new or enlarging pulmonary nodules or masses identified. No evidence of acute infiltrate.  Musculoskeletal/Soft Tissues: No suspicious bone lesions or other significant chest wall abnormality.  Upper Abdomen:  Unremarkable.  IMPRESSION: Stable subcentimeter right upper lobe pulmonary nodules dating back to prior chest CT on 11/27/2012, consistent with benign etiology. No other significant abnormality identified.   Electronically Signed   By: Earle Gell M.D.   On: 11/02/2014 15:30 NUCLEAR MEDICINE PET SKULL BASE TO THIGH  TECHNIQUE: 8.2 mCi F-18 FDG was injected intravenously. Full-ring PET imaging was performed from the skull base to thigh after the radiotracer. CT data was obtained and used for attenuation correction and anatomic localization.  FASTING BLOOD GLUCOSE:  Value: 91 mg/dl  COMPARISON:  CT on 07/13/2017 and 11/02/2014  FINDINGS: NECK:  No hypermetabolic lymph nodes or masses.  CHEST: No hypermetabolic lymphadenopathy. 11 mm ground-glass nodule in posterior right upper lobe shows low-grade FDG uptake with SUV max of 1.9. This nodule shows enlargement since 2016 exam, and is suspicious for low-grade adenocarcinoma. No other suspicious pulmonary nodules seen on CT images. Aortic and coronary artery atherosclerosis.  ABDOMEN/PELVIS: No abnormal hypermetabolic activity within the liver, pancreas, adrenal glands, or spleen. No hypermetabolic lymph nodes in the abdomen or pelvis. Prior hysterectomy. Aortic atherosclerosis.  SKELETON: No suspicious appearing hypermetabolic bone lesions identified . FDG uptake is seen at sites of healing/subacute fractures involving the right anterior  third, fourth, and fifth ribs.  IMPRESSION: 11 mm ground-glass nodule in posterior right upper lobe shows low-grade FDG uptake, and is suspicious for low-grade adenocarcinoma.  No evidence of metastatic disease.   Electronically Signed   By: Earle Gell M.D.   On: 08/21/2017 11:56 I personally reviewed the CT and PET/CT images and concur with the findings noted above  Pulmonary function testing FVC 3.02 (91%) FEV1 2.61 (103%) DLCO 18.63 (69%)  Impression: Mrs. Gordon is a 66 year old woman with a complicated past medical history including renal cell carcinoma, ovarian cancer, melanoma, tobacco abuse, arthritis, fibromyalgia, chronic pain, chronic opioid use, depression, anxiety, and congestive heart failure.  She has been followed for a small groundglass opacity in the right upper lobe.  This has now grown to 11 mm and is mildly hypermetabolic on PET/CT.  This is highly suspicious for a low-grade adenocarcinoma.  Infectious or inflammatory etiologies are also in the differential but far less likely.  I discussed several potential options with Mrs. Borkenhagen.  One option would be continued radiographic follow-up.  I would advise against that given the high index of suspicion.  Second would be attempt at CT-guided or bronchoscopic biopsy.  I would only recommend that if she were to refuse surgery and went to have stereotactic radiation.  The final and best option is surgical resection for definitive diagnosis and treatment.  My primary concern in her case with surgical resection is her risk for chronic pain given her history.  I described general nature of the procedure including the incisions to be used, the need for general anesthesia, the use of drainage tubes postoperatively, the expected hospital stay, and the overall recovery.  I informed her of the  indications, risks, benefits, and alternatives.  She understands the risks include, but not limited to death, MI, DVT, PE, stroke,  bleeding, possible need for transfusion, infection, prolonged air leak, cardiac arrhythmias, chronic pain, as well as possibility of other unforeseeable complications.  She is interested in proceeding with surgery, but does not want to commit to a definitive time yet.  She had an MR of the abdomen to evaluate the questionable renal finding on her CT.  She meets with urology tomorrow to see if any further workup is needed regarding that.  If there is a high index of suspicion that it is malignant, it would be reasonable to evaluate that prior to the slow-growing lung nodule.  Plan: She will see urology tomorrow regarding the findings on her MR abdomen.  She will let us know if she would like to proceed with right VATS for wedge resection and possible segmentectomy or lobectomy.  Melrose Nakayama, MD [facility] [phone]

## 2017-08-30 DIAGNOSIS — R3914 Feeling of incomplete bladder emptying: Secondary | ICD-10-CM | POA: Diagnosis not present

## 2017-08-31 ENCOUNTER — Encounter: Payer: Medicare Other | Admitting: Cardiothoracic Surgery

## 2017-09-03 ENCOUNTER — Telehealth: Payer: Self-pay | Admitting: Thoracic Surgery (Cardiothoracic Vascular Surgery)

## 2017-09-03 ENCOUNTER — Telehealth: Payer: Self-pay

## 2017-09-03 NOTE — Telephone Encounter (Signed)
Called pt to schedule Medicare Annual Wellness Visit with Nurse Health Advisor. Pt wants to wait and schedule after she has her upcoming surgery.  Josepha Pigg, B.A.  Care Guide - [facility]

## 2017-09-03 NOTE — Telephone Encounter (Signed)
Called Mrs. Stanhope to answer questions regarding pain management after VATS.   She was under the impression that we would give not her any narcotics after surgery. I explained that we would give her her baseline MS Contin and also PRN on top of that as needed. Cannot guarantee absolutely no pain but we will do ou best to keep her comfortable.  She expresses understanding  Also talked with her pain management specialist Linda Hedges, PA at [facility]. He has her on MS Contin 30 mg TID. She has taken oxycodone in addition to that in the past without issue  Remo Lipps C. Roxan Hockey, MD [facility] [phone] ]

## 2017-09-04 ENCOUNTER — Other Ambulatory Visit: Payer: Self-pay

## 2017-09-04 DIAGNOSIS — R911 Solitary pulmonary nodule: Secondary | ICD-10-CM

## 2017-09-24 DIAGNOSIS — M4726 Other spondylosis with radiculopathy, lumbar region: Secondary | ICD-10-CM | POA: Diagnosis not present

## 2017-09-24 DIAGNOSIS — Z981 Arthrodesis status: Secondary | ICD-10-CM | POA: Diagnosis not present

## 2017-09-24 DIAGNOSIS — M48061 Spinal stenosis, lumbar region without neurogenic claudication: Secondary | ICD-10-CM | POA: Diagnosis not present

## 2017-09-24 DIAGNOSIS — M5417 Radiculopathy, lumbosacral region: Secondary | ICD-10-CM | POA: Diagnosis not present

## 2017-09-24 DIAGNOSIS — M5136 Other intervertebral disc degeneration, lumbar region: Secondary | ICD-10-CM | POA: Diagnosis not present

## 2017-09-26 ENCOUNTER — Encounter (HOSPITAL_COMMUNITY)
Admission: RE | Admit: 2017-09-26 | Discharge: 2017-09-26 | Disposition: A | Discharge status: Home / Self Care | Payer: Medicare Other | Source: Ambulatory Visit | Attending: Thoracic Surgery (Cardiothoracic Vascular Surgery) | Admitting: Thoracic Surgery (Cardiothoracic Vascular Surgery)

## 2017-09-26 ENCOUNTER — Other Ambulatory Visit: Payer: Self-pay

## 2017-09-26 ENCOUNTER — Encounter (HOSPITAL_COMMUNITY): Payer: Self-pay

## 2017-09-26 DIAGNOSIS — I509 Heart failure, unspecified: Secondary | ICD-10-CM | POA: Diagnosis not present

## 2017-09-26 DIAGNOSIS — Z96651 Presence of right artificial knee joint: Secondary | ICD-10-CM | POA: Diagnosis not present

## 2017-09-26 DIAGNOSIS — D62 Acute posthemorrhagic anemia: Secondary | ICD-10-CM | POA: Diagnosis not present

## 2017-09-26 DIAGNOSIS — Z87891 Personal history of nicotine dependence: Secondary | ICD-10-CM | POA: Diagnosis not present

## 2017-09-26 DIAGNOSIS — Z85820 Personal history of malignant melanoma of skin: Secondary | ICD-10-CM | POA: Diagnosis not present

## 2017-09-26 DIAGNOSIS — G4730 Sleep apnea, unspecified: Secondary | ICD-10-CM | POA: Diagnosis not present

## 2017-09-26 DIAGNOSIS — F329 Major depressive disorder, single episode, unspecified: Secondary | ICD-10-CM | POA: Diagnosis not present

## 2017-09-26 DIAGNOSIS — E039 Hypothyroidism, unspecified: Secondary | ICD-10-CM | POA: Diagnosis not present

## 2017-09-26 DIAGNOSIS — Z905 Acquired absence of kidney: Secondary | ICD-10-CM | POA: Diagnosis not present

## 2017-09-26 DIAGNOSIS — Z8543 Personal history of malignant neoplasm of ovary: Secondary | ICD-10-CM | POA: Diagnosis not present

## 2017-09-26 DIAGNOSIS — R911 Solitary pulmonary nodule: Secondary | ICD-10-CM

## 2017-09-26 DIAGNOSIS — C3411 Malignant neoplasm of upper lobe, right bronchus or lung: Secondary | ICD-10-CM | POA: Diagnosis not present

## 2017-09-26 DIAGNOSIS — F419 Anxiety disorder, unspecified: Secondary | ICD-10-CM | POA: Diagnosis not present

## 2017-09-26 HISTORY — DX: Dyspnea, unspecified: R06.00

## 2017-09-26 LAB — TYPE AND SCREEN
ABO/RH(D): AB POS
Antibody Screen: NEGATIVE

## 2017-09-26 LAB — BLOOD GAS, ARTERIAL
Acid-Base Excess: 0.8 mmol/L (ref 0.0–2.0)
Bicarbonate: 24.5 mmol/L (ref 20.0–28.0)
Drawn by: 470591
FIO2: 21
O2 Saturation: 96.4 %
Patient temperature: 98.6
pCO2 arterial: 36.3 mmHg (ref 32.0–48.0)
pH, Arterial: 7.444 (ref 7.350–7.450)
pO2, Arterial: 82.7 mmHg — ABNORMAL LOW (ref 83.0–108.0)

## 2017-09-26 LAB — SURGICAL PCR SCREEN
MRSA, PCR: POSITIVE — AB
Staphylococcus aureus: POSITIVE — AB

## 2017-09-26 LAB — COMPREHENSIVE METABOLIC PANEL
ALT: 27 U/L (ref 14–54)
AST: 28 U/L (ref 15–41)
Albumin: 4.1 g/dL (ref 3.5–5.0)
Alkaline Phosphatase: 96 U/L (ref 38–126)
Anion gap: 13 (ref 5–15)
BUN: 40 mg/dL — ABNORMAL HIGH (ref 6–20)
CO2: 22 mmol/L (ref 22–32)
Calcium: 9.3 mg/dL (ref 8.9–10.3)
Chloride: 101 mmol/L (ref 101–111)
Creatinine, Ser: 1.16 mg/dL — ABNORMAL HIGH (ref 0.44–1.00)
GFR calc Af Amer: 55 mL/min — ABNORMAL LOW (ref >60)
GFR calc non Af Amer: 47 mL/min — ABNORMAL LOW (ref >60)
Glucose, Bld: 106 mg/dL — ABNORMAL HIGH (ref 65–99)
Potassium: 3.8 mmol/L (ref 3.5–5.1)
Sodium: 136 mmol/L (ref 135–145)
Total Bilirubin: 0.5 mg/dL (ref 0.3–1.2)
Total Protein: 7.1 g/dL (ref 6.5–8.1)

## 2017-09-26 LAB — PROTIME-INR
INR: 1.11
Prothrombin Time: 14.2 seconds (ref 11.4–15.2)

## 2017-09-26 LAB — URINALYSIS, ROUTINE W REFLEX MICROSCOPIC
Bilirubin Urine: NEGATIVE
Glucose, UA: NEGATIVE mg/dL
Hgb urine dipstick: NEGATIVE
Ketones, ur: NEGATIVE mg/dL
Nitrite: NEGATIVE
Protein, ur: NEGATIVE mg/dL
Specific Gravity, Urine: 1.015 (ref 1.005–1.030)
Squamous Epithelial / LPF: NONE SEEN
pH: 7 (ref 5.0–8.0)

## 2017-09-26 LAB — CBC
HCT: 36.4 % (ref 36.0–46.0)
Hemoglobin: 11.9 g/dL — ABNORMAL LOW (ref 12.0–15.0)
MCH: 29.3 pg (ref 26.0–34.0)
MCHC: 32.7 g/dL (ref 30.0–36.0)
MCV: 89.7 fL (ref 78.0–100.0)
Platelets: 156 10*3/uL (ref 150–400)
RBC: 4.06 MIL/uL (ref 3.87–5.11)
RDW: 13.6 % (ref 11.5–15.5)
WBC: 10.3 10*3/uL (ref 4.0–10.5)

## 2017-09-26 LAB — APTT: aPTT: 27 seconds (ref 24–36)

## 2017-09-26 NOTE — Pre-Procedure Instructions (Signed)
[name]  09/26/2017      [facility] - [name], [name] [name] Phone: [phone] Fax: [phone]    Your procedure is scheduled on Dec. 7  Report to [facility] Admitting at 5:30 A.M.  Call this number if you have problems the morning of surgery:  [phone]   Remember:  Do not eat food or drink liquids after midnight on Thurs. Dec. 6   Take these medicines the morning of surgery with A SIP OF WATER : alprazolam (xanax),bupropion (wellbutrin), cyclobenzaprine (flexeril), levothyroxine (synthroid), morphine if needed, pro air if needed-bring to hospital,             7 days prior to surgery STOP taking any Aspirin(unless otherwise instructed by your surgeon), Aleve, Naproxen, Ibuprofen, Motrin, Advil, Goody's, BC's, all herbal medications, fish oil, and all vitamins    Do not wear jewelry, make-up or nail polish.  Do not wear lotions, powders, or perfumes, or deoderant.  Do not shave 48 hours prior to surgery.  Men may shave face and neck.  Do not bring valuables to the hospital.  [facility] is not responsible for any belongings or valuables.  Contacts, dentures or bridgework may not be worn into surgery.  Leave your suitcase in the car.  After surgery it may be brought to your room.  For patients admitted to the hospital, discharge time will be determined by your treatment team.  Patients discharged the day of surgery will not be allowed to drive home.    Special instructions:   [name]- Preparing For Surgery  Before surgery, you can play an important role. Because skin is not sterile, your skin needs to be as free of germs as possible. You can reduce the number of germs on your skin by washing with CHG (chlorahexidine gluconate) Soap before surgery.  CHG is an antiseptic cleaner which kills germs and bonds with the skin to continue killing germs even after washing.  Please do not use if you  have an allergy to CHG or antibacterial soaps. If your skin becomes reddened/irritated stop using the CHG.  Do not shave (including legs and underarms) for at least 48 hours prior to first CHG shower. It is OK to shave your face.  Please follow these instructions carefully.   1. Shower the NIGHT BEFORE SURGERY and the MORNING OF SURGERY with CHG.   2. If you chose to wash your hair, wash your hair first as usual with your normal shampoo.  3. After you shampoo, rinse your hair and body thoroughly to remove the shampoo.  4. Use CHG as you would any other liquid soap. You can apply CHG directly to the skin and wash gently with a scrungie or a clean washcloth.   5. Apply the CHG Soap to your body ONLY FROM THE NECK DOWN.  Do not use on open wounds or open sores. Avoid contact with your eyes, ears, mouth and genitals (private parts). Wash Face and genitals (private parts)  with your normal soap.  6. Wash thoroughly, paying special attention to the area where your surgery will be performed.  7. Thoroughly rinse your body with warm water from the neck down.  8. DO NOT shower/wash with your normal soap after using and rinsing off the CHG Soap.  9. Pat yourself dry with a CLEAN TOWEL.  10. Wear CLEAN PAJAMAS to bed the night before surgery, wear comfortable clothes the  morning of surgery  11. Place CLEAN SHEETS on your bed the night of your first shower and DO NOT SLEEP WITH PETS.    Day of Surgery: Do not apply any deodorants/lotions. Please wear clean clothes to the hospital/surgery center.      Please read over the following fact sheets that you were given. Coughing and Deep Breathing, MRSA Information and Surgical Site Infection Prevention

## 2017-09-26 NOTE — Progress Notes (Signed)
SPOKE WITH ALLISON RE: DEITHILOPRIM. SHE STATED PATIENT JUST NEEDED TO NOT TAKE IT DAY OF SURGERY. (PATIENT INSTRUCTED).

## 2017-09-26 NOTE — Progress Notes (Addendum)
Anesthesia Chart Review: Patient is a 66 year old female scheduled for right VATS/wedge resection/possible segmentectomy or lobectomy on 09/28/17 by Dr. Modesto Charon. Diagnosis: RUL lung nodule.  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), CHF (listed, but not well documented; patient reported only history of pericardial effusion '09--LVEF normal and mild diastolic dysfunction at that time), murmur (mild AR/MR/TR 09/2013), hypothyroidism, fibromyalgia, venous insufficiency, skin cancer (melanoma excision ~ '10, SCC), renal cancer (s/p left partial nephrectomy 01/18/04; right partial nephrectomy 09/09/07), CKD, anemia, depression, ovarian cancer (s/p right oophorectomy 07/12/04; left oophorectomy ~ '79), hysterectomy, exertional dyspnea, migraines, left TKA 08/02/15, right TKA 08/09/06, L3-4 arthrodesis 01/06/02, L5-S1 arthrodesis 04/10/11, appendectomy, tonsillectomy, left reverse total shoulder 10/13/10. Reports she work up during knee replacement '07 and during carpal tunnel surgery. (I reviewed 2007 right TKA operative report that indicates procedure was done under spinal with MAC; left TKA in 2016 was done under GA since patient had lumbar fusion history).  - PCP is Dr. Delia Chimes. - Pulmonologist is Dr. Baltazar Apo.  - Pain Management specialist is with [facility] ([facility] - [place]). Last visit 07/25/17 with Sherilyn Cooter, PA-C. - She is not routinely followed by cardiology, but saw Dr. Adrian Prows in the past. Last office visit on 04/27/15 with Neldon Labella, AGNP-C with PRN cardiology follow-up recommended.  Meds include Xanax, Lipitor, Wellbutrin SR, Flexeril, diethylpropion HCL (told to hold morning of surgery), MS Contin, levothyroxine, ProAir HFA, Maxzide, Ambien.  BP 124/67   Pulse 94   Temp 36.6 C   Resp 20   Ht _0  (1.676 m)   Wt 168 lb 10.4 oz (76.5  kg)   SpO2 100%   BMI 27.22 kg/m   EKG 09/26/17: NSR, non-specific T wave abnormality.  According to Dr. Leonarda Salon 08/29/17 office note: She says that she gets short of breath if she walks up an incline, but can walk a good distance on level ground.  She does not have any chest pain, pressure, or tightness.  She denies headaches or visual changes.  She does suffer from chronic pain in her back and joints.  She is on MS Contin 30 mg every 8.  She denies any change in appetite.  She has lost weight after going on weight watchers.  She is has lost 21 pounds over the past 6 months and 3 pounds over the past 3 months. Zubrod Score: At the time of surgery this patient's most appropriate activity status/level should be described as: _1     0    Normal activity, no symptoms _2     1    Restricted in physical strenuous activity but ambulatory, able to do out light work _3     2    Ambulatory and capable of self care, unable to do work activities, up and about >50 % of waking hours                              _4     3    Only limited self care, in bed greater than 50% of waking hours _5     4    Completely disabled, no self care, confined to bed or chair _6     5    Moribund   Echo 10/20/13 [facility] Cardiovascular): Conclusion: 1. Left ventricular cavity is normal in size. Normal diastolic filling. Mild global hypokinesis. Mildly decreased systolic global function.  Calculated EF 50%. Visual EF is 45-50%. 2. Mild aortic regurgitation. 3. Mitral valve structurally normal. Mild mitral regurgitation. Mitral valve inflow A > E ratio. 4. Tricuspid valve structurally normal. Mild tricuspid regurgitation. No pulmonary hypertension. 5. Normal pericardium.  Nuclear stress test 12/19/12: Overall Impression:   Normal stress nuclear study. LV Wall Motion:  Wall motion cannot be assessed to problems with gating acquisition. EF was calculated to be 69%.  Chest CT 07/13/17: IMPRESSION: 1. Slowly enlarging  ground-glass nodule in the right upper lobe. Low-grade adenocarcinoma cannot be excluded. Lesion is borderline in size for PET evaluation. Follow-up CT chest without contrast in 1 year is recommended. Consultation with thoracic surgery or pulmonary medicine should also be considered. 2. Aortic atherosclerosis (ICD10-170.0). Coronary artery calcification.  PET Scan 08/21/17: IMPRESSION: 11 mm ground-glass nodule in posterior right upper lobe shows low-grade FDG uptake, and is suspicious for low-grade adenocarcinoma. No evidence of metastatic disease.  CXR 09/26/17: IMPRESSION: There is no acute pneumonia nor CHF. The known right upper lobe nodule is not evident on today's study. Thoracic aortic atherosclerosis.  PFTs 08/24/17: FVC 3.02 (91%), FEV1 2.61 (103%), DLCO unc 18.63 (69%).  Preoperative labs noted. BUN 40, Cr 1.16 (previously 41/1.33 on 09/02/15, Cr 1.40 on 08/28/17). Glucose 106. H/H 11.9/36.4. UA showed moderate leukocytes, but negative nitrites. TCTS nurse Manuela Schwartz aware.  Above reviewed with anesthesiologist Dr. Roanna Banning. If no new/progressive CV symptoms then would anticipate that she can proceed as planned. Recommend to call patient to discuss. (I called and spoke with patient. She denied heart issues other than pericarditis/pericardial effusion. She has had no recent recurrences. She denied CP, palpitations, syncope, edema. She does report DOE when walking up an incline (not new), but once up the incline can walk her dog for ~ 20 minutes. Was a little more SOB up the incline yesterday with the cold air. She does not sleep well, but feels she can lie flat if needed. Typically sleeps on one pillow. She is anxious about surgery--particularly since there is a concern for lung cancer.)  George [facility] Short Stay Center/Anesthesiology Phone [phone] 09/27/2017 11:46 AM

## 2017-09-27 ENCOUNTER — Encounter (HOSPITAL_COMMUNITY): Payer: Self-pay | Admitting: Certified Registered Nurse Anesthetist

## 2017-09-28 ENCOUNTER — Encounter (HOSPITAL_COMMUNITY)
Admission: RE | Disposition: A | Discharge status: Home / Self Care | Payer: Self-pay | Source: Ambulatory Visit | Attending: Thoracic Surgery (Cardiothoracic Vascular Surgery)

## 2017-09-28 ENCOUNTER — Encounter (HOSPITAL_COMMUNITY): Payer: Self-pay | Admitting: *Deleted

## 2017-09-28 ENCOUNTER — Inpatient Hospital Stay (HOSPITAL_COMMUNITY)
Admission: RE | Admit: 2017-09-28 | Discharge: 2017-10-02 | DRG: 164 | Disposition: A | Discharge status: Home / Self Care | Payer: Medicare Other | Source: Ambulatory Visit | Attending: Thoracic Surgery (Cardiothoracic Vascular Surgery) | Admitting: Thoracic Surgery (Cardiothoracic Vascular Surgery)

## 2017-09-28 ENCOUNTER — Inpatient Hospital Stay (HOSPITAL_COMMUNITY): Payer: Medicare Other | Admitting: Anesthesiology

## 2017-09-28 ENCOUNTER — Inpatient Hospital Stay (HOSPITAL_COMMUNITY): Payer: Medicare Other | Admitting: Vascular Surgery

## 2017-09-28 ENCOUNTER — Inpatient Hospital Stay (HOSPITAL_COMMUNITY): Payer: Medicare Other

## 2017-09-28 ENCOUNTER — Other Ambulatory Visit: Payer: Self-pay

## 2017-09-28 DIAGNOSIS — N189 Chronic kidney disease, unspecified: Secondary | ICD-10-CM | POA: Diagnosis present

## 2017-09-28 DIAGNOSIS — Z85820 Personal history of malignant melanoma of skin: Secondary | ICD-10-CM

## 2017-09-28 DIAGNOSIS — C3411 Malignant neoplasm of upper lobe, right bronchus or lung: Secondary | ICD-10-CM | POA: Diagnosis not present

## 2017-09-28 DIAGNOSIS — Z79891 Long term (current) use of opiate analgesic: Secondary | ICD-10-CM | POA: Diagnosis not present

## 2017-09-28 DIAGNOSIS — J939 Pneumothorax, unspecified: Secondary | ICD-10-CM | POA: Diagnosis not present

## 2017-09-28 DIAGNOSIS — Z87891 Personal history of nicotine dependence: Secondary | ICD-10-CM | POA: Diagnosis not present

## 2017-09-28 DIAGNOSIS — F419 Anxiety disorder, unspecified: Secondary | ICD-10-CM | POA: Diagnosis present

## 2017-09-28 DIAGNOSIS — F329 Major depressive disorder, single episode, unspecified: Secondary | ICD-10-CM | POA: Diagnosis present

## 2017-09-28 DIAGNOSIS — Z905 Acquired absence of kidney: Secondary | ICD-10-CM

## 2017-09-28 DIAGNOSIS — Z90721 Acquired absence of ovaries, unilateral: Secondary | ICD-10-CM

## 2017-09-28 DIAGNOSIS — J9811 Atelectasis: Secondary | ICD-10-CM | POA: Diagnosis not present

## 2017-09-28 DIAGNOSIS — M1990 Unspecified osteoarthritis, unspecified site: Secondary | ICD-10-CM | POA: Diagnosis present

## 2017-09-28 DIAGNOSIS — Z90710 Acquired absence of both cervix and uterus: Secondary | ICD-10-CM | POA: Diagnosis not present

## 2017-09-28 DIAGNOSIS — Z8543 Personal history of malignant neoplasm of ovary: Secondary | ICD-10-CM

## 2017-09-28 DIAGNOSIS — G8929 Other chronic pain: Secondary | ICD-10-CM | POA: Diagnosis present

## 2017-09-28 DIAGNOSIS — J9383 Other pneumothorax: Secondary | ICD-10-CM | POA: Diagnosis not present

## 2017-09-28 DIAGNOSIS — R911 Solitary pulmonary nodule: Secondary | ICD-10-CM | POA: Diagnosis not present

## 2017-09-28 DIAGNOSIS — M797 Fibromyalgia: Secondary | ICD-10-CM | POA: Diagnosis present

## 2017-09-28 DIAGNOSIS — Z981 Arthrodesis status: Secondary | ICD-10-CM | POA: Diagnosis not present

## 2017-09-28 DIAGNOSIS — D62 Acute posthemorrhagic anemia: Secondary | ICD-10-CM | POA: Diagnosis not present

## 2017-09-28 DIAGNOSIS — E039 Hypothyroidism, unspecified: Secondary | ICD-10-CM | POA: Diagnosis present

## 2017-09-28 DIAGNOSIS — Z79899 Other long term (current) drug therapy: Secondary | ICD-10-CM | POA: Diagnosis not present

## 2017-09-28 DIAGNOSIS — I872 Venous insufficiency (chronic) (peripheral): Secondary | ICD-10-CM | POA: Diagnosis present

## 2017-09-28 DIAGNOSIS — G4730 Sleep apnea, unspecified: Secondary | ICD-10-CM | POA: Diagnosis present

## 2017-09-28 DIAGNOSIS — Z96651 Presence of right artificial knee joint: Secondary | ICD-10-CM | POA: Diagnosis present

## 2017-09-28 DIAGNOSIS — Z85528 Personal history of other malignant neoplasm of kidney: Secondary | ICD-10-CM

## 2017-09-28 DIAGNOSIS — I509 Heart failure, unspecified: Secondary | ICD-10-CM | POA: Diagnosis present

## 2017-09-28 DIAGNOSIS — Z9889 Other specified postprocedural states: Secondary | ICD-10-CM

## 2017-09-28 DIAGNOSIS — Z96612 Presence of left artificial shoulder joint: Secondary | ICD-10-CM | POA: Diagnosis present

## 2017-09-28 DIAGNOSIS — I739 Peripheral vascular disease, unspecified: Secondary | ICD-10-CM | POA: Diagnosis not present

## 2017-09-28 DIAGNOSIS — Z09 Encounter for follow-up examination after completed treatment for conditions other than malignant neoplasm: Secondary | ICD-10-CM

## 2017-09-28 DIAGNOSIS — R0602 Shortness of breath: Secondary | ICD-10-CM | POA: Diagnosis not present

## 2017-09-28 HISTORY — PX: VIDEO ASSISTED THORACOSCOPY (VATS)/WEDGE RESECTION: SHX6174

## 2017-09-28 LAB — GLUCOSE, CAPILLARY
Glucose-Capillary: 164 mg/dL — ABNORMAL HIGH (ref 65–99)
Glucose-Capillary: 168 mg/dL — ABNORMAL HIGH (ref 65–99)

## 2017-09-28 SURGERY — VIDEO ASSISTED THORACOSCOPY (VATS)/WEDGE RESECTION
Anesthesia: General | Site: Chest | Laterality: Right

## 2017-09-28 MED ORDER — HYDROMORPHONE HCL 1 MG/ML IJ SOLN
0.2500 mg | INTRAMUSCULAR | Status: DC | PRN
  Administered 2017-09-28 (×3): 0.5 mg via INTRAVENOUS
  Filled 2017-09-28: qty 1

## 2017-09-28 MED ORDER — ALPRAZOLAM 0.5 MG PO TABS
0.5000 mg | ORAL_TABLET | Freq: Two times a day (BID) | ORAL | Status: DC | PRN
  Administered 2017-09-29 – 2017-10-01 (×4): 0.5 mg via ORAL
  Filled 2017-09-28 (×4): qty 1

## 2017-09-28 MED ORDER — ALBUTEROL SULFATE (2.5 MG/3ML) 0.083% IN NEBU: 2.5000 mg | INHALATION_SOLUTION | RESPIRATORY_TRACT | Status: DC | PRN

## 2017-09-28 MED ORDER — HYDROMORPHONE HCL 1 MG/ML IJ SOLN
INTRAMUSCULAR | Status: AC
  Administered 2017-09-28: 0.5 mg via INTRAVENOUS
  Filled 2017-09-28: qty 1

## 2017-09-28 MED ORDER — VANCOMYCIN HCL IN DEXTROSE 1-5 GM/200ML-% IV SOLN
1000.0000 mg | INTRAVENOUS | Status: AC
  Administered 2017-09-28: 1000 mg via INTRAVENOUS
  Filled 2017-09-28: qty 200

## 2017-09-28 MED ORDER — EPHEDRINE 5 MG/ML INJ
INTRAVENOUS | Status: AC
  Filled 2017-09-28: qty 10

## 2017-09-28 MED ORDER — LEVOTHYROXINE SODIUM 100 MCG PO TABS
100.0000 ug | ORAL_TABLET | Freq: Every day | ORAL | Status: DC
  Administered 2017-09-29 – 2017-10-02 (×4): 100 ug via ORAL
  Filled 2017-09-28 (×4): qty 1

## 2017-09-28 MED ORDER — 0.9 % SODIUM CHLORIDE (POUR BTL) OPTIME
TOPICAL | Status: DC | PRN
  Administered 2017-09-28: 2000 mL

## 2017-09-28 MED ORDER — BUPIVACAINE ON-Q PAIN PUMP (FOR ORDER SET NO CHG)
INJECTION | Status: AC
  Filled 2017-09-28: qty 1

## 2017-09-28 MED ORDER — DIPHENHYDRAMINE HCL 50 MG/ML IJ SOLN: 12.5000 mg | Freq: Four times a day (QID) | INTRAMUSCULAR | Status: DC | PRN

## 2017-09-28 MED ORDER — ONDANSETRON HCL 4 MG/2ML IJ SOLN
INTRAMUSCULAR | Status: DC | PRN
  Administered 2017-09-28: 4 mg via INTRAVENOUS

## 2017-09-28 MED ORDER — PHENYLEPHRINE 40 MCG/ML (10ML) SYRINGE FOR IV PUSH (FOR BLOOD PRESSURE SUPPORT)
PREFILLED_SYRINGE | INTRAVENOUS | Status: AC
  Filled 2017-09-28: qty 10

## 2017-09-28 MED ORDER — SUGAMMADEX SODIUM 200 MG/2ML IV SOLN
INTRAVENOUS | Status: AC
  Filled 2017-09-28: qty 2

## 2017-09-28 MED ORDER — SENNOSIDES-DOCUSATE SODIUM 8.6-50 MG PO TABS
1.0000 | ORAL_TABLET | Freq: Every day | ORAL | Status: DC
  Administered 2017-09-28 – 2017-10-01 (×4): 1 via ORAL
  Filled 2017-09-28 (×4): qty 1

## 2017-09-28 MED ORDER — SODIUM CHLORIDE 0.9% FLUSH: 9.0000 mL | INTRAVENOUS | Status: DC | PRN

## 2017-09-28 MED ORDER — OXYCODONE HCL 5 MG PO TABS
5.0000 mg | ORAL_TABLET | ORAL | Status: DC | PRN
  Administered 2017-09-28 (×2): 10 mg via ORAL
  Administered 2017-09-29: 5 mg via ORAL
  Administered 2017-09-29 – 2017-10-02 (×13): 10 mg via ORAL
  Filled 2017-09-28 (×7): qty 2
  Filled 2017-09-28: qty 1
  Filled 2017-09-28 (×8): qty 2

## 2017-09-28 MED ORDER — ACETAMINOPHEN 10 MG/ML IV SOLN
INTRAVENOUS | Status: DC | PRN
  Administered 2017-09-28: 1000 mg via INTRAVENOUS

## 2017-09-28 MED ORDER — BUPIVACAINE HCL (PF) 0.5 % IJ SOLN
INTRAMUSCULAR | Status: AC
  Filled 2017-09-28: qty 10

## 2017-09-28 MED ORDER — POTASSIUM CHLORIDE 10 MEQ/50ML IV SOLN
10.0000 meq | Freq: Every day | INTRAVENOUS | Status: DC | PRN
  Filled 2017-09-28: qty 50

## 2017-09-28 MED ORDER — ATORVASTATIN CALCIUM 10 MG PO TABS
10.0000 mg | ORAL_TABLET | Freq: Every day | ORAL | Status: DC
  Administered 2017-09-29 – 2017-10-02 (×4): 10 mg via ORAL
  Filled 2017-09-28 (×4): qty 1

## 2017-09-28 MED ORDER — NALOXONE HCL 0.4 MG/ML IJ SOLN: 0.4000 mg | INTRAMUSCULAR | Status: DC | PRN

## 2017-09-28 MED ORDER — VANCOMYCIN HCL IN DEXTROSE 1-5 GM/200ML-% IV SOLN
1000.0000 mg | Freq: Two times a day (BID) | INTRAVENOUS | Status: AC
  Administered 2017-09-28: 1000 mg via INTRAVENOUS
  Filled 2017-09-28: qty 200

## 2017-09-28 MED ORDER — MIDAZOLAM HCL 2 MG/2ML IJ SOLN
INTRAMUSCULAR | Status: AC
  Filled 2017-09-28: qty 2

## 2017-09-28 MED ORDER — LACTATED RINGERS IV SOLN
INTRAVENOUS | Status: DC | PRN
  Administered 2017-09-28 (×3): via INTRAVENOUS

## 2017-09-28 MED ORDER — OXYCODONE HCL 5 MG PO TABS: 5.0000 mg | ORAL_TABLET | Freq: Once | ORAL | Status: DC | PRN

## 2017-09-28 MED ORDER — ROCURONIUM BROMIDE 10 MG/ML (PF) SYRINGE
PREFILLED_SYRINGE | INTRAVENOUS | Status: AC
  Filled 2017-09-28: qty 5

## 2017-09-28 MED ORDER — ROCURONIUM BROMIDE 100 MG/10ML IV SOLN
INTRAVENOUS | Status: DC | PRN
  Administered 2017-09-28 (×2): 20 mg via INTRAVENOUS
  Administered 2017-09-28: 50 mg via INTRAVENOUS

## 2017-09-28 MED ORDER — FENTANYL CITRATE (PF) 250 MCG/5ML IJ SOLN
INTRAMUSCULAR | Status: AC
  Filled 2017-09-28: qty 5

## 2017-09-28 MED ORDER — PROMETHAZINE HCL 25 MG/ML IJ SOLN: 6.2500 mg | INTRAMUSCULAR | Status: DC | PRN

## 2017-09-28 MED ORDER — ONDANSETRON HCL 4 MG/2ML IJ SOLN
4.0000 mg | Freq: Four times a day (QID) | INTRAMUSCULAR | Status: DC | PRN
  Administered 2017-09-28 – 2017-10-02 (×7): 4 mg via INTRAVENOUS
  Filled 2017-09-28 (×7): qty 2

## 2017-09-28 MED ORDER — SULFAMETHOXAZOLE-TRIMETHOPRIM 800-160 MG PO TABS
1.0000 | ORAL_TABLET | Freq: Two times a day (BID) | ORAL | Status: AC
  Administered 2017-09-29 – 2017-10-01 (×6): 1 via ORAL
  Filled 2017-09-28 (×8): qty 1

## 2017-09-28 MED ORDER — SUGAMMADEX SODIUM 200 MG/2ML IV SOLN
INTRAVENOUS | Status: DC | PRN
  Administered 2017-09-28: 150.6 mg via INTRAVENOUS

## 2017-09-28 MED ORDER — ACETAMINOPHEN 160 MG/5ML PO SOLN
1000.0000 mg | Freq: Four times a day (QID) | ORAL | Status: DC
  Administered 2017-09-29: 1000 mg via ORAL
  Filled 2017-09-28: qty 40.6

## 2017-09-28 MED ORDER — ONDANSETRON HCL 4 MG/2ML IJ SOLN
INTRAMUSCULAR | Status: AC
  Filled 2017-09-28: qty 2

## 2017-09-28 MED ORDER — KETAMINE HCL 10 MG/ML IJ SOLN
INTRAMUSCULAR | Status: DC | PRN
  Administered 2017-09-28 (×2): 10 mg via INTRAVENOUS
  Administered 2017-09-28: 20 mg via INTRAVENOUS
  Administered 2017-09-28: 40 mg via INTRAVENOUS
  Administered 2017-09-28 (×2): 10 mg via INTRAVENOUS

## 2017-09-28 MED ORDER — DEXAMETHASONE SODIUM PHOSPHATE 10 MG/ML IJ SOLN
INTRAMUSCULAR | Status: DC | PRN
  Administered 2017-09-28: 10 mg via INTRAVENOUS

## 2017-09-28 MED ORDER — MIDAZOLAM HCL 5 MG/5ML IJ SOLN
INTRAMUSCULAR | Status: DC | PRN
  Administered 2017-09-28: 2 mg via INTRAVENOUS

## 2017-09-28 MED ORDER — LIDOCAINE 2% (20 MG/ML) 5 ML SYRINGE
INTRAMUSCULAR | Status: AC
  Filled 2017-09-28: qty 5

## 2017-09-28 MED ORDER — ACETAMINOPHEN 500 MG PO TABS
1000.0000 mg | ORAL_TABLET | Freq: Four times a day (QID) | ORAL | Status: DC
  Administered 2017-09-28 – 2017-10-02 (×14): 1000 mg via ORAL
  Filled 2017-09-28 (×16): qty 2

## 2017-09-28 MED ORDER — BUPIVACAINE 0.5 % ON-Q PUMP SINGLE CATH 400 ML
400.0000 mL | INJECTION | Status: AC
  Administered 2017-09-28: 400 mL
  Filled 2017-09-28: qty 400

## 2017-09-28 MED ORDER — PROPOFOL 10 MG/ML IV BOLUS
INTRAVENOUS | Status: AC
  Filled 2017-09-28: qty 20

## 2017-09-28 MED ORDER — ACETAMINOPHEN 10 MG/ML IV SOLN
INTRAVENOUS | Status: AC
  Filled 2017-09-28: qty 100

## 2017-09-28 MED ORDER — TRAMADOL HCL 50 MG PO TABS
50.0000 mg | ORAL_TABLET | Freq: Four times a day (QID) | ORAL | Status: DC | PRN
  Administered 2017-09-30 – 2017-10-01 (×4): 100 mg via ORAL
  Filled 2017-09-28 (×4): qty 2

## 2017-09-28 MED ORDER — DEXTROSE-NACL 5-0.45 % IV SOLN
INTRAVENOUS | Status: DC
  Administered 2017-09-28 – 2017-09-29 (×4): via INTRAVENOUS

## 2017-09-28 MED ORDER — FENTANYL CITRATE (PF) 100 MCG/2ML IJ SOLN
INTRAMUSCULAR | Status: DC | PRN
  Administered 2017-09-28: 50 ug via INTRAVENOUS
  Administered 2017-09-28: 75 ug via INTRAVENOUS
  Administered 2017-09-28 (×2): 50 ug via INTRAVENOUS
  Administered 2017-09-28: 75 ug via INTRAVENOUS
  Administered 2017-09-28 (×4): 50 ug via INTRAVENOUS

## 2017-09-28 MED ORDER — PROPOFOL 10 MG/ML IV BOLUS
INTRAVENOUS | Status: DC | PRN
  Administered 2017-09-28: 200 mg via INTRAVENOUS

## 2017-09-28 MED ORDER — OXYCODONE HCL 5 MG/5ML PO SOLN: 5.0000 mg | Freq: Once | ORAL | Status: DC | PRN

## 2017-09-28 MED ORDER — LIDOCAINE HCL (CARDIAC) 20 MG/ML IV SOLN
INTRAVENOUS | Status: DC | PRN
  Administered 2017-09-28: 60 mg via INTRAVENOUS

## 2017-09-28 MED ORDER — DEXAMETHASONE SODIUM PHOSPHATE 10 MG/ML IJ SOLN
INTRAMUSCULAR | Status: AC
  Filled 2017-09-28: qty 1

## 2017-09-28 MED ORDER — SUCCINYLCHOLINE CHLORIDE 200 MG/10ML IV SOSY
PREFILLED_SYRINGE | INTRAVENOUS | Status: AC
  Filled 2017-09-28: qty 10

## 2017-09-28 MED ORDER — BISACODYL 5 MG PO TBEC
10.0000 mg | DELAYED_RELEASE_TABLET | Freq: Every day | ORAL | Status: DC
  Administered 2017-09-29 – 2017-10-02 (×4): 10 mg via ORAL
  Filled 2017-09-28 (×4): qty 2

## 2017-09-28 MED ORDER — DIPHENHYDRAMINE HCL 12.5 MG/5ML PO ELIX
12.5000 mg | ORAL_SOLUTION | Freq: Four times a day (QID) | ORAL | Status: DC | PRN
  Filled 2017-09-28: qty 5

## 2017-09-28 MED ORDER — FENTANYL CITRATE (PF) 100 MCG/2ML IJ SOLN
25.0000 ug | INTRAMUSCULAR | Status: DC | PRN
  Administered 2017-09-29 – 2017-09-30 (×6): 25 ug via INTRAVENOUS
  Filled 2017-09-28 (×6): qty 2

## 2017-09-28 MED ORDER — INSULIN ASPART 100 UNIT/ML ~~LOC~~ SOLN
0.0000 [IU] | SUBCUTANEOUS | Status: DC
  Administered 2017-09-28 – 2017-09-29 (×3): 4 [IU] via SUBCUTANEOUS
  Administered 2017-09-29 (×4): 2 [IU] via SUBCUTANEOUS

## 2017-09-28 MED ORDER — ENOXAPARIN SODIUM 40 MG/0.4ML ~~LOC~~ SOLN
40.0000 mg | Freq: Every day | SUBCUTANEOUS | Status: DC
  Administered 2017-09-29 – 2017-10-02 (×4): 40 mg via SUBCUTANEOUS
  Filled 2017-09-28 (×4): qty 0.4

## 2017-09-28 MED ORDER — HEMOSTATIC AGENTS (NO CHARGE) OPTIME
TOPICAL | Status: DC | PRN
  Administered 2017-09-28: 1 via TOPICAL

## 2017-09-28 MED ORDER — FENTANYL 40 MCG/ML IV SOLN
INTRAVENOUS | Status: DC
  Administered 2017-09-28: 1000 ug via INTRAVENOUS
  Administered 2017-09-28: 120 ug via INTRAVENOUS
  Administered 2017-09-28: 105 ug via INTRAVENOUS
  Administered 2017-09-29: 195 ug via INTRAVENOUS
  Administered 2017-09-29: 135 ug via INTRAVENOUS
  Administered 2017-09-29: 90 ug via INTRAVENOUS
  Administered 2017-09-29: 345 ug via INTRAVENOUS
  Administered 2017-09-29: 1000 ug via INTRAVENOUS
  Administered 2017-09-29: 255 ug via INTRAVENOUS
  Administered 2017-09-29: 285 ug via INTRAVENOUS
  Administered 2017-09-30: 180 ug via INTRAVENOUS
  Filled 2017-09-28: qty 1000
  Filled 2017-09-28 (×2): qty 25

## 2017-09-28 MED ORDER — KETAMINE HCL-SODIUM CHLORIDE 100-0.9 MG/10ML-% IV SOSY
PREFILLED_SYRINGE | INTRAVENOUS | Status: AC
  Filled 2017-09-28: qty 10

## 2017-09-28 MED ORDER — BUPIVACAINE HCL (PF) 0.5 % IJ SOLN
INTRAMUSCULAR | Status: DC | PRN
  Administered 2017-09-28: 5 mL

## 2017-09-28 MED ORDER — BUPROPION HCL ER (SR) 150 MG PO TB12
150.0000 mg | ORAL_TABLET | Freq: Every day | ORAL | Status: DC
  Administered 2017-09-29 – 2017-10-02 (×4): 150 mg via ORAL
  Filled 2017-09-28 (×5): qty 1

## 2017-09-28 SURGICAL SUPPLY — 95 items
BENZOIN TINCTURE PRP APPL 2/3 (GAUZE/BANDAGES/DRESSINGS) IMPLANT
CANISTER SUCT 3000ML PPV (MISCELLANEOUS) ×8 IMPLANT
CATH KIT ON Q 5IN SLV (PAIN MANAGEMENT) IMPLANT
CATH KIT ON-Q SILVERSOAK 5IN (CATHETERS) ×4 IMPLANT
CATH THORACIC 28FR (CATHETERS) IMPLANT
CATH THORACIC 36FR (CATHETERS) IMPLANT
CATH THORACIC 36FR RT ANG (CATHETERS) IMPLANT
CLIP VESOCCLUDE MED 6/CT (CLIP) ×4 IMPLANT
CONN ST 1/4X3/8  BEN (MISCELLANEOUS)
CONN ST 1/4X3/8 BEN (MISCELLANEOUS) IMPLANT
CONN Y 3/8X3/8X3/8  BEN (MISCELLANEOUS)
CONN Y 3/8X3/8X3/8 BEN (MISCELLANEOUS) IMPLANT
CONT SPEC 4OZ CLIKSEAL STRL BL (MISCELLANEOUS) ×28 IMPLANT
COVER SURGICAL LIGHT HANDLE (MISCELLANEOUS) ×4 IMPLANT
DERMABOND ADVANCED (GAUZE/BANDAGES/DRESSINGS) ×2
DERMABOND ADVANCED .7 DNX12 (GAUZE/BANDAGES/DRESSINGS) ×2 IMPLANT
DRAIN CHANNEL 28F RND 3/8 FF (WOUND CARE) ×4 IMPLANT
DRAIN CHANNEL 32F RND 10.7 FF (WOUND CARE) IMPLANT
DRAPE LAPAROSCOPIC ABDOMINAL (DRAPES) ×4 IMPLANT
DRAPE WARM FLUID 44X44 (DRAPE) ×4 IMPLANT
ELECT BLADE 6.5 EXT (BLADE) ×4 IMPLANT
ELECT REM PT RETURN 9FT ADLT (ELECTROSURGICAL) ×4
ELECTRODE REM PT RTRN 9FT ADLT (ELECTROSURGICAL) ×2 IMPLANT
GAUZE SPONGE 4X4 12PLY STRL (GAUZE/BANDAGES/DRESSINGS) IMPLANT
GAUZE SPONGE 4X4 12PLY STRL LF (GAUZE/BANDAGES/DRESSINGS) ×4 IMPLANT
GLOVE BIOGEL PI IND STRL 6.5 (GLOVE) ×2 IMPLANT
GLOVE BIOGEL PI INDICATOR 6.5 (GLOVE) ×2
GLOVE ECLIPSE 6.5 STRL STRAW (GLOVE) ×4 IMPLANT
GLOVE SS N UNI LF 7.0 STRL (GLOVE) ×4 IMPLANT
GLOVE SURG SIGNA 7.5 PF LTX (GLOVE) ×8 IMPLANT
GLOVE SURG SS PI 6.0 STRL IVOR (GLOVE) ×4 IMPLANT
GOWN STRL REUS W/ TWL LRG LVL3 (GOWN DISPOSABLE) ×6 IMPLANT
GOWN STRL REUS W/ TWL XL LVL3 (GOWN DISPOSABLE) ×8 IMPLANT
GOWN STRL REUS W/TWL LRG LVL3 (GOWN DISPOSABLE) ×6
GOWN STRL REUS W/TWL XL LVL3 (GOWN DISPOSABLE) ×8
HANDLE STAPLE ENDO GIA SHORT (STAPLE)
HEMOSTAT SURGICEL 2X14 (HEMOSTASIS) IMPLANT
KIT BASIN OR (CUSTOM PROCEDURE TRAY) ×4 IMPLANT
KIT ROOM TURNOVER OR (KITS) ×4 IMPLANT
KIT SUCTION CATH 14FR (SUCTIONS) ×4 IMPLANT
KIT TUBE JEJUNAL 16FR (CATHETERS) ×4 IMPLANT
NS IRRIG 1000ML POUR BTL (IV SOLUTION) ×12 IMPLANT
PACK CHEST (CUSTOM PROCEDURE TRAY) ×4 IMPLANT
PAD ARMBOARD 7.5X6 YLW CONV (MISCELLANEOUS) ×16 IMPLANT
POUCH ENDO CATCH II 15MM (MISCELLANEOUS) IMPLANT
POUCH SPECIMEN RETRIEVAL 10MM (ENDOMECHANICALS) IMPLANT
RELOAD STAPLER GOLD 60MM (STAPLE) ×8 IMPLANT
RELOAD STAPLER GREEN 60MM (STAPLE) ×2 IMPLANT
SCISSORS ENDO CVD 5DCS (MISCELLANEOUS) IMPLANT
SEALANT PROGEL (MISCELLANEOUS) ×4 IMPLANT
SEALANT SURG COSEAL 4ML (VASCULAR PRODUCTS) IMPLANT
SEALANT SURG COSEAL 8ML (VASCULAR PRODUCTS) IMPLANT
SHEARS HARMONIC HDI 20CM (ELECTROSURGICAL) IMPLANT
SOLUTION ANTI FOG 6CC (MISCELLANEOUS) ×4 IMPLANT
SPECIMEN JAR MEDIUM (MISCELLANEOUS) ×4 IMPLANT
SPONGE INTESTINAL PEANUT (DISPOSABLE) ×16 IMPLANT
SPONGE TONSIL 1 RF SGL (DISPOSABLE) ×4 IMPLANT
STAPLE ECHEON FLEX 60 POW ENDO (STAPLE) ×4 IMPLANT
STAPLER ENDO GIA 12MM SHORT (STAPLE) IMPLANT
STAPLER RELOAD GOLD 60MM (STAPLE) ×16
STAPLER RELOAD GREEN 60MM (STAPLE) ×4
SUT PROLENE 4 0 RB 1 (SUTURE)
SUT PROLENE 4-0 RB1 .5 CRCL 36 (SUTURE) IMPLANT
SUT SILK  1 MH (SUTURE) ×8
SUT SILK 1 MH (SUTURE) ×8 IMPLANT
SUT SILK 1 TIES 10X30 (SUTURE) ×4 IMPLANT
SUT SILK 2 0 SH (SUTURE) IMPLANT
SUT SILK 2 0SH CR/8 30 (SUTURE) IMPLANT
SUT SILK 3 0 SH 30 (SUTURE) IMPLANT
SUT SILK 3 0SH CR/8 30 (SUTURE) ×8 IMPLANT
SUT VIC AB 0 CTX 27 (SUTURE) IMPLANT
SUT VIC AB 1 CTX 27 (SUTURE) ×4 IMPLANT
SUT VIC AB 1 CTX 36 (SUTURE) ×4
SUT VIC AB 1 CTX36XBRD ANBCTR (SUTURE) ×4 IMPLANT
SUT VIC AB 2-0 CT1 27 (SUTURE)
SUT VIC AB 2-0 CT1 TAPERPNT 27 (SUTURE) IMPLANT
SUT VIC AB 2-0 CTX 27 (SUTURE) ×4 IMPLANT
SUT VIC AB 2-0 CTX 36 (SUTURE) ×4 IMPLANT
SUT VIC AB 3-0 MH 27 (SUTURE) IMPLANT
SUT VIC AB 3-0 SH 27 (SUTURE)
SUT VIC AB 3-0 SH 27X BRD (SUTURE) IMPLANT
SUT VIC AB 3-0 X1 27 (SUTURE) ×4 IMPLANT
SUT VICRYL 0 UR6 27IN ABS (SUTURE) IMPLANT
SUT VICRYL 2 TP 1 (SUTURE) IMPLANT
SWAB CULTURE ESWAB REG 1ML (MISCELLANEOUS) IMPLANT
SYSTEM SAHARA CHEST DRAIN ATS (WOUND CARE) ×4 IMPLANT
TAPE CLOTH SURG 4X10 WHT LF (GAUZE/BANDAGES/DRESSINGS) ×4 IMPLANT
TIP APPLICATOR SPRAY EXTEND 16 (VASCULAR PRODUCTS) ×4 IMPLANT
TOWEL GREEN STERILE (TOWEL DISPOSABLE) ×4 IMPLANT
TOWEL GREEN STERILE FF (TOWEL DISPOSABLE) ×4 IMPLANT
TRAY FOLEY W/METER SILVER 16FR (SET/KITS/TRAYS/PACK) ×4 IMPLANT
TROCAR XCEL BLADELESS 5X75MML (TROCAR) ×4 IMPLANT
TROCAR XCEL NON-BLD 5MMX100MML (ENDOMECHANICALS) IMPLANT
TUNNELER SHEATH ON-Q 11GX8 DSP (PAIN MANAGEMENT) ×4 IMPLANT
WATER STERILE IRR 1000ML POUR (IV SOLUTION) ×8 IMPLANT

## 2017-09-28 NOTE — Transfer of Care (Signed)
Immediate Anesthesia Transfer of Care Note  Patient: [name]  Procedure(s) Performed: RIGHT VIDEO ASSISTED THORACOSCOPY (VATS)/WEDGE RESECTION, LYMPH NODE DISSECTION  (Right Chest)  Patient Location: PACU  Anesthesia Type:General  Level of Consciousness: awake, alert , oriented and patient cooperative  Airway & Oxygen Therapy: Patient Spontanous Breathing and Patient connected to face mask oxygen  Post-op Assessment: Report given to RN, Post -op Vital signs reviewed and stable and Patient moving all extremities  Post vital signs: Reviewed and stable  Last Vitals:  BP 141/75 HR 73 RR 22 SpO2 100 T 37.0 C temporal Resting comfortably, maintains good airway, bair hugger in place and on.    Complications: No apparent anesthesia complications

## 2017-09-28 NOTE — Anesthesia Preprocedure Evaluation (Addendum)
Anesthesia Evaluation  Patient identified by MRN, date of birth, ID band Patient awake    Reviewed: Allergy & Precautions, NPO status , Patient's Chart, lab work & pertinent test results  History of Anesthesia Complications (+) history of anesthetic complications  Airway Mallampati: II  TM Distance: >3 FB Neck ROM: Full    Dental  (+) Upper Dentures   Pulmonary shortness of breath, former smoker,  Right Upper Lobe Pulmonary Nodule   Pulmonary exam normal breath sounds clear to auscultation       Cardiovascular Exercise Tolerance: Good + Peripheral Vascular Disease and +CHF  Normal cardiovascular exam Rhythm:Regular Rate:Normal  ECG: NSR, rate 79  PCP is Dr. Delia Chimes. Pulmonologist is Dr. Baltazar Apo.  Pain Management specialist is with [facility] ([facility] - [place]). Last visit 07/25/17 with Sherilyn Cooter, PA-C. She is not routinely followed by cardiology, but saw Dr. Adrian Prows in the past. Last office visit on 04/27/15 with Neldon Labella, AGNP-C with PRN cardiology follow-up recommended.     Neuro/Psych  Headaches, PSYCHIATRIC DISORDERS Anxiety Depression    GI/Hepatic negative GI ROS, Neg liver ROS,   Endo/Other  Hypothyroidism   Renal/GU      Musculoskeletal  (+) Arthritis , Fibromyalgia -, narcotic dependentChronic lower back pain   Abdominal   Peds  Hematology negative hematology ROS (+) anemia ,   Anesthesia Other Findings HLD  Reproductive/Obstetrics                            Anesthesia Physical  Anesthesia Plan  ASA: III  Anesthesia Plan: General   Post-op Pain Management:    Induction: Intravenous  PONV Risk Score and Plan: 2 and Dexamethasone, Ondansetron and Midazolam  Airway Management Planned: Double Lumen EBT  Additional Equipment: Arterial line  Intra-op Plan:   Post-operative Plan: Extubation in OR  Informed Consent: I have reviewed  the patients History and Physical, chart, labs and discussed the procedure including the risks, benefits and alternatives for the proposed anesthesia with the patient or authorized representative who has indicated his/her understanding and acceptance.   Dental advisory given  Plan Discussed with: CRNA  Anesthesia Plan Comments: ( )        Anesthesia Quick Evaluation

## 2017-09-28 NOTE — Progress Notes (Signed)
Patient ID: [name], female   DOB: January 08, 1951, 66 y.o.   MRN: [id number] TCTS Evening Rounds:  Hemodynamically stable  sats 100%  Urine output ok  CT output low

## 2017-09-28 NOTE — Brief Op Note (Addendum)
09/28/2017  11:09 AM  PATIENT:  [name]  66 y.o. female  PRE-OPERATIVE DIAGNOSIS:  Right Upper Lobe Pulmonary Nodule  POST-OPERATIVE DIAGNOSIS:  Right Upper Lobe Pulmonary Nodule  PROCEDURE:  Procedure(s): RIGHT VIDEO ASSISTED THORACOSCOPY (VATS)/WEDGE RESECTION, LYMPH NODE DISSECTION  (Right)/ On-Q LOCAL ANESTHETIC CATHETER PLACEMENT  SURGEON:  Surgeon(s) and Role:    * Melrose Nakayama, MD - Primary  PHYSICIAN ASSISTANT:   Nicholes Rough, PA-C   ANESTHESIA:   general  EBL:  150 mL   BLOOD ADMINISTERED:none  DRAINS: one straight chest tube   LOCAL MEDICATIONS USED:  MARCAINE , ON-Q pump   SPECIMEN:  Source of Specimen:  portion of the right upper lobe  DISPOSITION OF SPECIMEN:  PATHOLOGY  COUNTS:  YES  DICTATION: .Dragon Dictation  PLAN OF CARE: Admit to inpatient   PATIENT DISPOSITION:  ICU - intubated and hemodynamically stable.   Delay start of Pharmacological VTE agent (>24hrs) due to surgical blood loss or risk of bleeding: yes

## 2017-09-28 NOTE — Interval H&P Note (Signed)
History and Physical Interval Note:  09/28/2017 7:25 AM  [name]  has presented today for surgery, with the diagnosis of Right Upper Lobe Pulmonary Nodule  The various methods of treatment have been discussed with the patient and family. After consideration of risks, benefits and other options for treatment, the patient has consented to  Procedure(s): RIGHT VIDEO ASSISTED THORACOSCOPY (VATS)/WEDGE RESECTION,POSSIVLE SEGMENTECTOMY OR LOBECTOMY (Right) SEGMENTECTOMY (Right) LOBECTOMY (Right) as a surgical intervention .  The patient's history has been reviewed, patient examined, no change in status, stable for surgery.  I have reviewed the patient's chart and labs.  Questions were answered to the patient's satisfaction.     Melrose Nakayama

## 2017-09-28 NOTE — Op Note (Signed)
NAMEGULIANNA, HORNSBY.:  [id number]  MEDICAL RECORD NO.:  [id number]  LOCATION:                                 FACILITY:  PHYSICIAN:  Revonda Standard. Roxan Hockey, M.D. DATE OF BIRTH:  DATE OF PROCEDURE:  09/28/2017 DATE OF DISCHARGE:                              OPERATIVE REPORT   PREOPERATIVE DIAGNOSIS:  Right upper lobe lung nodule.  POSTOPERATIVE DIAGNOSIS:  Adenocarcinoma, clinical stage IA.  PROCEDURE:   Right video-assisted thoracoscopy; Wedge resection, right upper lobe nodule; Mediastinal lymph node dissection;  On-Q local anesthetic catheter placement.  SURGEON:  Modesto Charon, M.D.  ASSISTANT:  Nicholes Rough PA-C.  ANESTHESIA:  General.  FINDINGS:  Extensive adhesions of visceral and parietal pleura.  Nodule vaguely palpable in posterior aspect of right upper lobe resected with a 3-cm gross margin.  Frozen section revealed adenocarcinoma, margins free of tumor.  CLINICAL NOTE:  Ms. Spitzley is a 66 year old woman with a history of tobacco abuse, who has been followed for multiple lung nodules.  She recently had a CT, which showed an increase in the size of one of the nodules to 11 mm.  It had both ground-glass and solid components.  It was mildly hypermetabolic on PET-CT.  She was referred for consideration for surgical resection.  The indications, risks, benefits, and alternatives were discussed in detail with the patient.  She understood and accepted the risks and agreed to proceed.  OPERATIVE NOTE:  Ms. Sias was brought to the preoperative holding area on September 28, 2017.  Anesthesia placed a central line and an arterial blood pressure monitoring line.  She was taken to the operating room, anesthetized, and intubated with a double-lumen endotracheal tube. Intravenous antibiotics were administered.  A Foley catheter was placed. Sequential compression devices were placed on the calves for DVT prophylaxis.  She was placed in a left  lateral decubitus position and the right chest was prepped and draped in the usual sterile fashion. Single lung ventilation of the left lung was initiated and was tolerated well throughout the procedure.  An incision was made in the seventh interspace in the midaxillary line. A 5-mm port was inserted into the chest.  There was good isolation of the right lung.  There was no pleural effusion.  There were extensive adhesions of the upper lobe posteriorly and laterally, and the lower middle lobes posteriorly, inferiorly, and anteriorly.  A 5-cm working incision was made in the third interspace.  No rib spreading was performed during the procedure.  The adhesions were taken down with electrocautery.  There also were extensive adhesions in the fissures.  When the adhesions were taken down, it was clear that the fissures were incomplete.  The adhesions of the upper lobe were freed up.  The nodule was relatively deep and relatively small and it was difficult to palpate the nodule. The adhesions in the major fissure were taken down between the superior segment and the upper lobe to the point where the fissure was incomplete.  The pulmonary artery then was dissected out at the confluence of the major and minor fissures and the remainder of the major fissure was completed  with a single firing of an Echelon powered endoscopic stapler using a gold cartridge.  A 60-mm stapler was used. This freed up the posterior aspect of the right upper lobe and the nodule was more easily palpable.  A wide wedge resection was performed keeping a 3-cm gross margin.  This was essentially the posterior one- third of the upper lobe that was removed.  A combination of gold and green staple cartridges were used.  The specimen was removed.  The nodule was palpated and marked as well as the closest margin. The specimen was sent for frozen section.  The inferior ligament was divided.  No level 9 node was identified.  The  pleural reflection was divided at the hilum posteriorly.  Two level 7 nodes were identified and removed.  All lymph nodes that were encountered appeared grossly benign.  All were sent for permanent pathology only.  A level 11 node that had been exposed by division of the fissure was removed and sent for permanent pathology as well as a level 12 node along the right upper lobe bronchus.  The pleural reflection then was divided anteriorly after taking down additional adhesions.  A large, but otherwise, benign-appearing level 10 node was removed and a small 4R node was removed.  The frozen section returned showing a well-differentiated adenocarcinoma with clear margins.  Given the good gross margin and the low-grade nature of the tumor, it was felt that an anatomic resection was not necessary.  The chest was copiously irrigated with warm saline.  A test inflation to 30 cm of water revealed leakage from the fissure dissection.  Progel was applied to this area.  A 28-French chest tube was placed through the original port incision.  Prior to removing the camera to place the chest tube, an On-Q local anesthetic catheter was placed through a separate incision posteriorly and tunneled into a subpleural location was primed with 5 mL of 0.5% Marcaine.  The right lung was reinflated.  The chest tube was secured with a #1 silk suture.  The working incision was closed in 3 layers.  Chest tube was placed to suction.  The patient was placed back in a supine position.  She was extubated in the operating room and taken to the postanesthetic care unit in good condition.     Revonda Standard Roxan Hockey, M.D.     SCH/MEDQ  D:  09/28/2017  T:  09/28/2017  Job:  [id number]

## 2017-09-28 NOTE — Progress Notes (Signed)
Patient was informed yesterday, that she was positive for MRSA.  She did all 10 doses yesterday.  Instructed her to keep using it for another couple of days, more effective that way.

## 2017-09-28 NOTE — Anesthesia Postprocedure Evaluation (Signed)
Anesthesia Post Note  Patient: [name]  Procedure(s) Performed: RIGHT VIDEO ASSISTED THORACOSCOPY (VATS)/WEDGE RESECTION, LYMPH NODE DISSECTION  (Right Chest)     Patient location during evaluation: PACU Anesthesia Type: General Level of consciousness: awake Pain management: pain level controlled Vital Signs Assessment: post-procedure vital signs reviewed and stable Respiratory status: spontaneous breathing, nonlabored ventilation, respiratory function stable and patient connected to nasal cannula oxygen Cardiovascular status: blood pressure returned to baseline and stable Postop Assessment: no apparent nausea or vomiting Anesthetic complications: no    Last Vitals:  Vitals:   09/28/17 1325 09/28/17 1436  BP: (!) 143/71   Pulse: 75   Resp: 15   Temp:  36.5 C  SpO2: 100%     Last Pain:  Vitals:   09/28/17 1436  TempSrc: Oral  PainSc:                  Vergil Burby P Chon Buhl

## 2017-09-28 NOTE — Anesthesia Procedure Notes (Signed)
Arterial Line Insertion Start/End12/04/2017 7:00 AM, 09/28/2017 7:05 AM Performed by: Lowella Dell, CRNA, CRNA  Patient location: Pre-op. Preanesthetic checklist: patient identified, IV checked, site marked, risks and benefits discussed, surgical consent, monitors and equipment checked, pre-op evaluation and timeout performed Lidocaine 1% used for infiltration Left, radial was placed Catheter size: 20 G Hand hygiene performed  and maximum sterile barriers used   Attempts: 1 Procedure performed without using ultrasound guided technique. Following insertion, dressing applied and Biopatch. Post procedure assessment: normal  Patient tolerated the procedure well with no immediate complications.

## 2017-09-28 NOTE — Anesthesia Procedure Notes (Signed)
Procedure Name: Intubation Date/Time: 09/28/2017 7:48 AM Performed by: Lowella Dell, CRNA Pre-anesthesia Checklist: Patient identified, Emergency Drugs available, Suction available, Patient being monitored and Timeout performed Patient Re-evaluated:Patient Re-evaluated prior to induction Oxygen Delivery Method: Circle system utilized Preoxygenation: Pre-oxygenation with 100% oxygen Induction Type: IV induction Ventilation: Mask ventilation without difficulty Laryngoscope Size: Mac and 3 Grade View: Grade I Endobronchial tube: Left, EBT position confirmed by auscultation, Double lumen EBT and EBT position confirmed by fiberoptic bronchoscope and 37 Fr Number of attempts: 1 Airway Equipment and Method: Stylet Placement Confirmation: ETT inserted through vocal cords under direct vision,  positive ETCO2 and breath sounds checked- equal and bilateral Secured at: 27 cm Tube secured with: Tape Dental Injury: Teeth and Oropharynx as per pre-operative assessment

## 2017-09-29 ENCOUNTER — Inpatient Hospital Stay (HOSPITAL_COMMUNITY): Payer: Medicare Other

## 2017-09-29 ENCOUNTER — Encounter (HOSPITAL_COMMUNITY): Payer: Self-pay | Admitting: Thoracic Surgery (Cardiothoracic Vascular Surgery)

## 2017-09-29 LAB — BLOOD GAS, ARTERIAL
Acid-Base Excess: 2.9 mmol/L — ABNORMAL HIGH (ref 0.0–2.0)
Bicarbonate: 27.4 mmol/L (ref 20.0–28.0)
O2 Content: 2 L/min
O2 Saturation: 98.8 %
Patient temperature: 98.6
pCO2 arterial: 45.3 mmHg (ref 32.0–48.0)
pH, Arterial: 7.398 (ref 7.350–7.450)
pO2, Arterial: 141 mmHg — ABNORMAL HIGH (ref 83.0–108.0)

## 2017-09-29 LAB — GLUCOSE, CAPILLARY
Glucose-Capillary: 109 mg/dL — ABNORMAL HIGH (ref 65–99)
Glucose-Capillary: 122 mg/dL — ABNORMAL HIGH (ref 65–99)
Glucose-Capillary: 126 mg/dL — ABNORMAL HIGH (ref 65–99)
Glucose-Capillary: 136 mg/dL — ABNORMAL HIGH (ref 65–99)
Glucose-Capillary: 166 mg/dL — ABNORMAL HIGH (ref 65–99)
Glucose-Capillary: 96 mg/dL (ref 65–99)

## 2017-09-29 LAB — CBC
HCT: 32 % — ABNORMAL LOW (ref 36.0–46.0)
Hemoglobin: 10.7 g/dL — ABNORMAL LOW (ref 12.0–15.0)
MCH: 29.6 pg (ref 26.0–34.0)
MCHC: 33.4 g/dL (ref 30.0–36.0)
MCV: 88.6 fL (ref 78.0–100.0)
Platelets: 134 10*3/uL — ABNORMAL LOW (ref 150–400)
RBC: 3.61 MIL/uL — ABNORMAL LOW (ref 3.87–5.11)
RDW: 13.2 % (ref 11.5–15.5)
WBC: 12.5 10*3/uL — ABNORMAL HIGH (ref 4.0–10.5)

## 2017-09-29 LAB — BASIC METABOLIC PANEL
Anion gap: 7 (ref 5–15)
BUN: 24 mg/dL — ABNORMAL HIGH (ref 6–20)
CO2: 27 mmol/L (ref 22–32)
Calcium: 8.2 mg/dL — ABNORMAL LOW (ref 8.9–10.3)
Chloride: 101 mmol/L (ref 101–111)
Creatinine, Ser: 0.91 mg/dL (ref 0.44–1.00)
GFR calc Af Amer: >60 mL/min (ref >60)
GFR calc non Af Amer: >60 mL/min (ref >60)
Glucose, Bld: 151 mg/dL — ABNORMAL HIGH (ref 65–99)
Potassium: 3.7 mmol/L (ref 3.5–5.1)
Sodium: 135 mmol/L (ref 135–145)

## 2017-09-29 MED ORDER — CHLORHEXIDINE GLUCONATE CLOTH 2 % EX PADS
6.0000 | MEDICATED_PAD | Freq: Every day | CUTANEOUS | Status: DC
  Administered 2017-09-29 – 2017-10-02 (×3): 6 via TOPICAL

## 2017-09-29 MED ORDER — POTASSIUM CHLORIDE CRYS ER 20 MEQ PO TBCR
20.0000 meq | EXTENDED_RELEASE_TABLET | ORAL | Status: DC | PRN
  Administered 2017-09-30: 20 meq via ORAL
  Filled 2017-09-29: qty 1

## 2017-09-29 MED ORDER — MUPIROCIN 2 % EX OINT
1.0000 "application " | TOPICAL_OINTMENT | Freq: Two times a day (BID) | CUTANEOUS | Status: DC
  Administered 2017-09-29 – 2017-10-02 (×7): 1 via NASAL
  Filled 2017-09-29: qty 22

## 2017-09-29 NOTE — Progress Notes (Signed)
This RN changed PCA syringe and wasted resulting 1.72 mL fentanyl in sink. Witnessed by Alma Friendly RN.

## 2017-09-29 NOTE — Plan of Care (Signed)
Pt in NAD sitting upright in chair. Reports that pain is somewhat controlled on PCA pump. No requests for additional pain meds at this time. Vital signs stable. Utilizing IS to 500 when prompted by RN. CT patent to suction at 20cm, serosanguinous output with no air leak detected. No bowel movement at this time but positive bowel sounds. Pt reports infrequent BM's at baseline (Q2-3 days). No safety concerns , call bell within reach.

## 2017-09-29 NOTE — Progress Notes (Signed)
1 Day Post-Op Procedure(s) (LRB): RIGHT VIDEO ASSISTED THORACOSCOPY (VATS)/WEDGE RESECTION, LYMPH NODE DISSECTION  (Right) Subjective: Complains of pain from chest tube. Ambulated around the ICU today. Overall feels ok  Objective: Vital signs in last 24 hours: Temp:  [98.4 F (36.9 C)-99 F (37.2 C)] 98.7 F (37.1 C) (12/08 1600) Pulse Rate:  [78-98] 94 (12/08 1602) Cardiac Rhythm: Normal sinus rhythm (12/08 1602) Resp:  [12-23] 18 (12/08 1607) BP: (90-139)/(53-101) 126/58 (12/08 1602) SpO2:  [85 %-100 %] 99 % (12/08 1607) Arterial Line BP: (137-183)/(45-73) 177/59 (12/08 1200) Weight:  [78.2 kg (172 lb 8 oz)] 78.2 kg (172 lb 8 oz) (12/08 0600)  Hemodynamic parameters for last 24 hours:    Intake/Output from previous day: 12/07 0701 - 12/08 0700 In: 3503 [I.V.:3303; IV Piggyback:200] Out: 7494 [Urine:2695; Blood:150; Chest Tube:310] Intake/Output this shift: No intake/output data recorded.  General appearance: alert and cooperative Neurologic: intact Heart: regular rate and rhythm, S1, S2 normal, no murmur, click, rub or gallop Lungs: clear to auscultation bilaterally Extremities: extremities normal, atraumatic, no cyanosis or edema Wound: dressing dry  Lab Results: Recent Labs    09/29/17 0337  WBC 12.5*  HGB 10.7*  HCT 32.0*  PLT 134*   BMET:  Recent Labs    09/29/17 0337  NA 135  K 3.7  CL 101  CO2 27  GLUCOSE 151*  BUN 24*  CREATININE 0.91  CALCIUM 8.2*    PT/INR: No results for input(s): LABPROT, INR in the last 72 hours. ABG    Component Value Date/Time   PHART 7.398 09/29/2017 0354   HCO3 27.4 09/29/2017 0354   TCO2 26.8 03/07/2008 0600   O2SAT 98.8 09/29/2017 0354   CBG (last 3)  Recent Labs    09/29/17 0358 09/29/17 0826 09/29/17 1612  GLUCAP 166* 96 122*   CLINICAL DATA:  Lung surgery.  Follow-up exam.  EXAM: PORTABLE CHEST 1 VIEW  COMPARISON:  09/28/2017  FINDINGS: Normal cardiac silhouette. RIGHT chest tube in place.  Small RIGHT pneumothorax present with pleura edge 19 mm from the apical chest wall. Small amount of subcutaneous gas on the RIGHT in the chest wall. LEFT lung clear.  IMPRESSION: Small postsurgical RIGHT apical pneumothorax with chest tube in place.  These results will be called to the ordering clinician or representative by the Radiologist Assistant, and communication documented in the PACS or zVision Dashboard.   Electronically Signed   By: Suzy Bouchard M.D.   On: 09/29/2017 09:26  Assessment/Plan: S/P Procedure(s) (LRB): RIGHT VIDEO ASSISTED THORACOSCOPY (VATS)/WEDGE RESECTION, LYMPH NODE DISSECTION  (Right)  POD 1 She has been hemodynamically stable in sinus rhythm. CT output low and no air leak. DC arterial line CT to water seal Continue ambulation and IS.   LOS: 1 day    Gaye Pollack 09/29/2017

## 2017-09-30 ENCOUNTER — Inpatient Hospital Stay (HOSPITAL_COMMUNITY): Payer: Medicare Other

## 2017-09-30 LAB — CBC
HCT: 34.4 % — ABNORMAL LOW (ref 36.0–46.0)
Hemoglobin: 11.3 g/dL — ABNORMAL LOW (ref 12.0–15.0)
MCH: 29.6 pg (ref 26.0–34.0)
MCHC: 32.8 g/dL (ref 30.0–36.0)
MCV: 90.1 fL (ref 78.0–100.0)
Platelets: 125 10*3/uL — ABNORMAL LOW (ref 150–400)
RBC: 3.82 MIL/uL — ABNORMAL LOW (ref 3.87–5.11)
RDW: 13.5 % (ref 11.5–15.5)
WBC: 11.5 10*3/uL — ABNORMAL HIGH (ref 4.0–10.5)

## 2017-09-30 LAB — COMPREHENSIVE METABOLIC PANEL
ALT: 20 U/L (ref 14–54)
AST: 20 U/L (ref 15–41)
Albumin: 3.1 g/dL — ABNORMAL LOW (ref 3.5–5.0)
Alkaline Phosphatase: 74 U/L (ref 38–126)
Anion gap: 8 (ref 5–15)
BUN: 14 mg/dL (ref 6–20)
CO2: 26 mmol/L (ref 22–32)
Calcium: 8.5 mg/dL — ABNORMAL LOW (ref 8.9–10.3)
Chloride: 101 mmol/L (ref 101–111)
Creatinine, Ser: 0.99 mg/dL (ref 0.44–1.00)
GFR calc Af Amer: >60 mL/min (ref >60)
GFR calc non Af Amer: 57 mL/min — ABNORMAL LOW (ref >60)
Glucose, Bld: 127 mg/dL — ABNORMAL HIGH (ref 65–99)
Potassium: 3.6 mmol/L (ref 3.5–5.1)
Sodium: 135 mmol/L (ref 135–145)
Total Bilirubin: 0.4 mg/dL (ref 0.3–1.2)
Total Protein: 5.8 g/dL — ABNORMAL LOW (ref 6.5–8.1)

## 2017-09-30 LAB — GLUCOSE, CAPILLARY
Glucose-Capillary: 112 mg/dL — ABNORMAL HIGH (ref 65–99)
Glucose-Capillary: 103 mg/dL — ABNORMAL HIGH (ref 65–99)

## 2017-09-30 MED ORDER — MORPHINE SULFATE ER 15 MG PO TBCR
30.0000 mg | EXTENDED_RELEASE_TABLET | Freq: Two times a day (BID) | ORAL | Status: DC
  Administered 2017-09-30 – 2017-10-02 (×5): 30 mg via ORAL
  Filled 2017-09-30 (×5): qty 2

## 2017-09-30 NOTE — Progress Notes (Signed)
Still c/o pain. IV therepay placed a  New IV in. Old one d/c. Patient went to [facility]  Ambulating well. Has a problem being around anyone when going to [facility] left in [facility]. With telemetry monitor. HR increasing upon activity to 140's. Dressing around old CT site dry and intact.

## 2017-09-30 NOTE — Progress Notes (Signed)
2 Days Post-Op Procedure(s) (LRB): RIGHT VIDEO ASSISTED THORACOSCOPY (VATS)/WEDGE RESECTION, LYMPH NODE DISSECTION  (Right) Subjective:  Complains of back pain that she thinks is worse than baseline due to chest tube.  Objective: Vital signs in last 24 hours: Temp:  [97.9 F (36.6 C)-98.8 F (37.1 C)] 97.9 F (36.6 C) (12/09 0815) Pulse Rate:  [82-98] 87 (12/09 0600) Cardiac Rhythm: Normal sinus rhythm (12/09 0400) Resp:  [15-23] 19 (12/09 0600) BP: (126-161)/(57-144) 145/73 (12/09 0600) SpO2:  [95 %-100 %] 96 % (12/09 0600) Arterial Line BP: (175-177)/(59-64) 175/64 (12/08 2000)  Hemodynamic parameters for last 24 hours:    Intake/Output from previous day: 12/08 0701 - 12/09 0700 In: 2437.5 [P.O.:960; I.V.:1477.5] Out: 2265 [Urine:2125; Chest Tube:140] Intake/Output this shift: No intake/output data recorded.  General appearance: alert and cooperative Neurologic: intact Heart: regular rate and rhythm, S1, S2 normal, no murmur, click, rub or gallop Lungs: clear to auscultation bilaterally Wound: incisions ok Chest tube has no air leak with cough  Lab Results: Recent Labs    09/29/17 0337 09/30/17 0338  WBC 12.5* 11.5*  HGB 10.7* 11.3*  HCT 32.0* 34.4*  PLT 134* 125*   BMET:  Recent Labs    09/29/17 0337 09/30/17 0338  NA 135 135  K 3.7 3.6  CL 101 101  CO2 27 26  GLUCOSE 151* 127*  BUN 24* 14  CREATININE 0.91 0.99  CALCIUM 8.2* 8.5*    PT/INR: No results for input(s): LABPROT, INR in the last 72 hours. ABG    Component Value Date/Time   PHART 7.398 09/29/2017 0354   HCO3 27.4 09/29/2017 0354   TCO2 26.8 03/07/2008 0600   O2SAT 98.8 09/29/2017 0354   CBG (last 3)  Recent Labs    09/29/17 2327 09/30/17 0352 09/30/17 0814  GLUCAP 109* 112* 103*   CXR: possibly tiny right apical ptx vs space. Lungs clear  Assessment/Plan: S/P Procedure(s) (LRB): RIGHT VIDEO ASSISTED THORACOSCOPY (VATS)/WEDGE RESECTION, LYMPH NODE DISSECTION  (Right)  POD  2 She has been hemodynamically stable in sinus rhythm  CT output low and no air leak. CXR ok so will remove tube. Follow up CXR in am.  DC On-Q  DC PCA and resume her MS-Contin and oxy IR prn  Continue IS and ambulation.    LOS: 2 days    Gaye Pollack 09/30/2017

## 2017-09-30 NOTE — Progress Notes (Signed)
Having pain throughout the day, medicated per order. Thought the pain would be less with chest tube out. She states not the case, but she is now able to rest with eyes closed at intervals. Right IV site leaking. Will consult IV therapy to replace. Also c/o anxiety and some nausea after eating a little of lunch. Has not had a good appetite

## 2017-09-30 NOTE — Progress Notes (Signed)
Discontinued foley and right chest tube per protocol. Patient tolerated procedures well. luncg sounds unchanged, sao2 good on r/a

## 2017-10-01 ENCOUNTER — Inpatient Hospital Stay (HOSPITAL_COMMUNITY): Payer: Medicare Other

## 2017-10-01 LAB — GLUCOSE, CAPILLARY: Glucose-Capillary: 150 mg/dL — ABNORMAL HIGH (ref 65–99)

## 2017-10-01 MED ORDER — CYCLOBENZAPRINE HCL 10 MG PO TABS: 5.0000 mg | ORAL_TABLET | Freq: Three times a day (TID) | ORAL | Status: DC | PRN

## 2017-10-01 MED ORDER — ZOLPIDEM TARTRATE 5 MG PO TABS
5.0000 mg | ORAL_TABLET | Freq: Every evening | ORAL | Status: DC | PRN
  Administered 2017-10-01: 5 mg via ORAL
  Filled 2017-10-01: qty 1

## 2017-10-01 MED ORDER — DICLOFENAC SODIUM 1 % TD GEL: 1.0000 "application " | Freq: Three times a day (TID) | TRANSDERMAL | Status: DC | PRN

## 2017-10-01 MED ORDER — POLYETHYLENE GLYCOL 3350 17 G PO PACK: 17.0000 g | PACK | Freq: Every day | ORAL | Status: DC | PRN

## 2017-10-01 NOTE — Plan of Care (Signed)
Pt progressing well. Orders to move out of ICU today. Pain management improving with current regimen.

## 2017-10-01 NOTE — Progress Notes (Signed)
3 Days Post-Op Procedure(s) (LRB): RIGHT VIDEO ASSISTED THORACOSCOPY (VATS)/WEDGE RESECTION, LYMPH NODE DISSECTION  (Right) Subjective: Feels better today Pain better controlled  Objective: Vital signs in last 24 hours: Temp:  [96.4 F (35.8 C)-99.1 F (37.3 C)] 99.1 F (37.3 C) (12/10 0823) Pulse Rate:  [88-101] 88 (12/10 0823) Cardiac Rhythm: Sinus tachycardia (12/10 0200) Resp:  [15-31] 23 (12/10 0200) BP: (115-149)/(58-113) 115/88 (12/10 0823) SpO2:  [94 %-99 %] 96 % (12/10 0823) Weight:  [170 lb 13.7 oz (77.5 kg)] 170 lb 13.7 oz (77.5 kg) (12/10 0200)  Hemodynamic parameters for last 24 hours:    Intake/Output from previous day: 12/09 0701 - 12/10 0700 In: 316 [P.O.:240; I.V.:76] Out: 400 [Urine:350; Chest Tube:50] Intake/Output this shift: No intake/output data recorded.  General appearance: alert, cooperative and no distress Neurologic: intact Heart: regular rate and rhythm Lungs: clear to auscultation bilaterally Wound: clean and dry  Lab Results: Recent Labs    09/29/17 0337 09/30/17 0338  WBC 12.5* 11.5*  HGB 10.7* 11.3*  HCT 32.0* 34.4*  PLT 134* 125*   BMET:  Recent Labs    09/29/17 0337 09/30/17 0338  NA 135 135  K 3.7 3.6  CL 101 101  CO2 27 26  GLUCOSE 151* 127*  BUN 24* 14  CREATININE 0.91 0.99  CALCIUM 8.2* 8.5*    PT/INR: No results for input(s): LABPROT, INR in the last 72 hours. ABG    Component Value Date/Time   PHART 7.398 09/29/2017 0354   HCO3 27.4 09/29/2017 0354   TCO2 26.8 03/07/2008 0600   O2SAT 98.8 09/29/2017 0354   CBG (last 3)  Recent Labs    09/29/17 2327 09/30/17 0352 09/30/17 0814  GLUCAP 109* 112* 103*    Assessment/Plan: S/P Procedure(s) (LRB): RIGHT VIDEO ASSISTED THORACOSCOPY (VATS)/WEDGE RESECTION, LYMPH NODE DISSECTION  (Right) Plan for transfer to step-down: see transfer orders  Doing well CXR OK with chest tube out Continue ambulation Pain control is better Path still pending Possibly home  in AM   LOS: 3 days    Melrose Nakayama 10/01/2017

## 2017-10-02 MED ORDER — TRAMADOL HCL 50 MG PO TABS: 50.0000 mg | ORAL_TABLET | Freq: Four times a day (QID) | ORAL | 0 refills | Status: DC | PRN

## 2017-10-02 NOTE — Discharge Instructions (Signed)
Thoracoscopy, Care After °Refer to this sheet in the next few weeks. These instructions provide you with information about caring for yourself after your procedure. Your health care provider may also give you more specific instructions. Your treatment has been planned according to current medical practices, but problems sometimes occur. Call your health care provider if you have any problems or questions after your procedure. °What can I expect after the procedure? °After your procedure, it is common to feel sore for up to two weeks. °Follow these instructions at home: °· There are many different ways to close and cover an incision, including stitches (sutures), skin glue, and adhesive strips. Follow your health care provider's instructions about: °? Incision care. °? Bandage (dressing) changes and removal. °? Incision closure removal. °· Check your incision area every day for signs of infection. Watch for: °? Redness, swelling, or pain. °? Fluid, blood, or pus. °· Take medicines only as directed by your health care provider. °· Try to cough often. Coughing helps to protect against lung infection (pneumonia). It may hurt to cough. If this happens, hold a pillow against your chest when you cough. °· Take deep breaths. This also helps to protect against pneumonia. °· If you were given an incentive spirometer, use it as directed by your health care provider. °· Do not take baths, swim, or use a hot tub until your health care provider approves. You may take showers. °· Avoid lifting until your health care provider approves. °· Avoid driving until your health care provider approves. °· Do not travel by airplane after the chest tube is removed until your health care provider approves. °Contact a health care provider if: °· You have a fever. °· Pain medicines do not ease your pain. °· You have redness, swelling, or increasing pain in your incision area. °· You develop a cough that does not go away, or you are coughing up  mucus that is yellow or green. °Get help right away if: °· You have fluid, blood, or pus coming from your incision. °· There is a bad smell coming from your incision or dressing. °· You develop a rash. °· You have difficulty breathing. °· You cough up blood. °· You develop light-headedness or you feel faint. °· You develop chest pain. °· Your heartbeat feels irregular or very fast. °This information is not intended to replace advice given to you by your health care provider. Make sure you discuss any questions you have with your health care provider. °Document Released: 04/28/2005 Document Revised: 06/11/2016 Document Reviewed: 06/24/2014 °Elsevier Interactive Patient Education © 2018 [employer] ° °

## 2017-10-02 NOTE — Discharge Summary (Signed)
Physician Discharge Summary  Patient ID: [name] MRN: [id number] DOB/AGE: 1950/11/25 66 y.o.  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 Lung nodule 09/28/2017  . Pulmonary nodule, right 08/08/2017  . S/P left TKA 08/02/2015  . S/P knee replacement 08/02/2015  . Carpal tunnel syndrome 04/23/2015  . Autoimmune disease ([place]) 03/29/2013  . Apnea, sleep 03/29/2013  . Anxiety 11/28/2012  . Seizure disorder ([place]) 11/28/2012  . Anxiety state 11/28/2012  . History of pericarditis, with pericardial window in 2009, and recurrent episodes 2011,2013, treated with methotrexate 11/27/2012  . SOB (shortness of breath) 11/27/2012  . Hypothyroidism 11/27/2012  . History of partial nephrectomy, both rt. and lt 11/27/2012  . Hx of ovarian cancer 11/27/2012  . Venous insufficiency 11/27/2012  . Chronic back pain, hx of lumbar spondylosis and stenosis L5-S1 with hx decompression and total diskectomy 11/27/2012  . Hx of melanoma of skin 11/27/2012  . 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 Chronic venous insufficiency 11/27/2012  . DIARRHEA-PRESUMED INFECTIOUS 12/27/2009     HPI: [name] sent for consultation by Dr Roxan Hockey regarding right upper lobe lung nodule.  [name] is a 66 year old woman with a history of tobacco abuse (30 pack years although she did quit 31 years ago).  Past medical history is significant for depression, anxiety, congestive heart failure, subxiphoid pericardial window, melanoma, renal cell carcinoma, ovarian cancer, anemia, hypothyroidism, arthritis, previous joint replacement and back surgery, chronic pain, fibromyalgia, chronic kidney disease, and venous insufficiency.  Is been followed for some time for a right upper lobe lung nodule.  She is not sure how this was initially found.  It  had previously been 6 mm and relatively stable.  She recently had a repeat CT which showed the nodules increased in size to 11 mm.  She was referred to Dr. Lamonte Sakai.  Appears to have both groundglass and solid components.  He recommended a PET/CT which showed the nodule was mildly hypermetabolic with an SUV of 1.9.  She is now referred for consideration for surgical resection.  She says that she gets short of breath if she walks up an incline, but can walk a good distance on level ground.  She does not have any chest pain, pressure, or tightness.  She denies headaches or visual changes.  She does suffer from chronic pain in her back and joints.  She is on MS Contin 30 mg every 8.  She denies any change in appetite.  She has lost weight after going on weight watchers.  She is has lost 21 pounds over the past 6 months and 3 pounds over the past 3 months.  The patient was admitted electively for the procedure.  PFINAL DIAGNOSIS Diagnosis Pathology 1. Lung, wedge biopsy/resection, Right Upper Lobe - ADENOCARCINOMA, WELL DIFFERENTIATED, SPANNING 1.0 CM. - THE SURGICAL RESECTION MARGINS ARE NEGATIVE FOR ADENOCARCINOMA. - ONE BENIGN PERIHILAR LYMPH NODE (0/1). 2. Lymph node, biopsy, 11 - THERE IS NO EVIDENCE OF CARCINOMA IN 1 OF 1 LYMPH NODE (0/1). 3. Lymph node, biopsy, 7 - THERE IS NO EVIDENCE OF CARCINOMA IN 1 OF 1 LYMPH NODE (0/1). 4. Lymph node, biopsy, 7#2 - THERE IS NO EVIDENCE OF CARCINOMA IN 1 OF 1 LYMPH NODE (0/1). 5. Lymph node, biopsy, 12 - THERE IS NO EVIDENCE OF CARCINOMA IN 1 OF 1 LYMPH NODE (0/1). 6. Lymph  node, biopsy, 10 - THERE IS NO EVIDENCE OF CARCINOMA IN 1 OF 1 LYMPH NODE (0/1). 7. Lymph node, biopsy, 4R - THERE IS NO EVIDENCE OF CARCINOMA IN 1 OF 1 LYMPH NODE (0/1). Microscopic Comment 1. LUNG Specimen, including laterality: Right upper lobe. Procedure: Wedge resection. Specimen integrity (intact/disrupted): Intact. Tumor site: Right upper lobe. Tumor focality:  Unifocal. Maximum tumor size (cm): 1.0 cm. Histologic type: Adenocarcinoma. Grade: Well differentiated. Margins: Negative for carcinoma. Distance to closest margin (cm): 2.5 cm from nearest stapled margin. Visceral pleura invasion: Not identified. 1 of 3 FINAL for [name], [name] ([id number]) Microscopic Comment(continued) Tumor extension: Defined to lung parenchyma. Treatment effect (if treated with neoadjuvant therapy): N/A. Lymph -Vascular invasion: Not identified. Lymph nodes: Number examined - 7; Number N1 nodes positive 0; Number N2 nodes positive 0. TNM code: pT1a, pN0. Ancillary Studies: Can be performed upon clinical request. Best tumor block for sendout testing: 1B or 1D. Non-neoplastic lung: No significant findings. Enid Cutter MD Pathologist, Electronic Signature (Case signed 10/02/2017) Intraoperative Diagnosis 1. RIGHT UPPER LOBE OF LUNG: -A. MARGIN: NEGATIVE -B. ADENOCARCINOMA. Specimen Gross and Clinical Information Specimen(s) Obtained: 1. Lung, wedge biopsy/resection, Right Upper Lobe 2. Lymph node, biopsy, 11 3. Lymph node, biopsy, 7 4. Lymph node, biopsy, 7#2 5. Lymph node, biopsy, 12 6. Lymph node, biopsy, 10 7. Lymph node, biopsy, 4R Specimen Clinical Information 1. Right upper lobe pulmonary nodule (nt) Gross 1. Received fresh for rapid intraoperative consultation and consists of a 60 gram, 11.9 x 5.5 x 3.0 cm piece of tan pink lung parenchyma with multiple stapled resection lines. The serosa is smooth, hyperemic, with mild anthracosis. A suture is present on the pleural surface, designating the underlying area of interest. An additional suture is present on the stapled resection margin closest to the area of interest. Sectioning reveals spongy tan red parenchyma. At the localizes area of interest, there is a 1.0 x 0.8 x 0.8 cm tan, indurated, ill defined possible lesion identified. Possible lesion measures 0.8 cm from the pleural surface and are  approximately 2.5 cm to the closest stapled resection margin. A representative section of the margin and lesion are submitted for frozen section analysis. The lesion measures approximately 1.5 cm to the stapled bronchial and vascular resection margins. Adjacent to the bronchial and vascular resection margins a 0.5 cm tan gray possible lymph node is identified. Representative sections are submitted in seven cassettes. A= margin closest to area of interest frozen section remnant. B= lesion frozen section remnant. C= bronchial and vascular resection margins. D,E= additional sections of lesions. F= grossly uninvolved parenchyma. G= one possible lymph node. 2 of 3 FINAL for [name], [name] ([id number]) Gross(continued) 2. Received in saline and consists of a 0.9 cm tan gray, anthracotic possible lymph node. The specimen is entirely submitted in one cassette. 3. Received in saline and consists of a 1.1 x 0.8 x 0.3 cm tan gray, anthracotic, possible lymph node. The specimen is entirely submitted in one cassette. 4. Received in saline and consists of a 1.9 x 1.6 x 0.3 cm tan gray, anthracotic and focally disrupted possible lymph node. The specimen is entirely submitted in one cassette. 5. Received in saline and consists of a 0.6 cm tan gray, anthracotic possible lymph node. The specimen is entirely submitted in one cassette. 6. Received in saline and consists of a 1.7 x 0.8 x 0.3 cm tan gray, anthracotic possible lymph node. The specimen is entirely submitted in one cassette. 7. Received in saline and consists of  a 0.6 cm tan gray, anthracotic possible lymph node. The specimen is entirely submitted in one cassette. (KL:gt, 09/28/17) Report signed out from the following location(s) Technical component and interpretation was performed at [facility], [place], [place] [address]. CLIA #: [id number],  Discharged Condition: good  Hospital Course: The patient was  admitted electively and taken to the operating room on 09/28/2017 where she underwent the below described procedure.  She tolerated it well and was taken to the surgical intensive care unit in stable condition.  Postoperative hospital course:  Postoperatively the patient has done quite well.  Pathology has revealed adenocarcinoma report below.  She is remained hemodynamically stable.  All routine lines, monitors and drainage devices have been discontinued in a standard stepwise fashion.  The pain has been under adequate control initially using a PCA was transitioned to oral meds.  She has a mild expected acute blood loss anemia which is stable.  Oxygen has been weaned and she maintains good saturations on room air.  Incisions are noted to be healing well without evidence of infection.  She is tolerating diet.  She is tolerating routine activities using standard postop rehabilitation protocols.  At time of discharge the patient is felt to be quite stable.  Consults: None  Significant Diagnostic Studies: routine post op labs and CXR's  Treatments: surgery:   PHYSICIAN:  Remo Lipps C. Roxan Hockey, M.D. DATE OF BIRTH:  DATE OF PROCEDURE:  09/28/2017 DATE OF DISCHARGE:                              OPERATIVE REPORT   PREOPERATIVE DIAGNOSIS:  Right upper lobe lung nodule.  POSTOPERATIVE DIAGNOSIS:  Adenocarcinoma, clinical stage IA.  PROCEDURE:  Right video-assisted thoracoscopy; wedge resection, right upper lobe nodule; mediastinal lymph node dissection; On-Q local anesthetic catheter placement.  SURGEON:  Modesto Charon, M.D.  ASSISTANT:  Nicholes Rough.  ANESTHESIA:  General.    Discharge Exam: Blood pressure 112/66, pulse 93, temperature 97.9 F (36.6 C), temperature source Oral, resp. rate 13, height 5\' 6"  (1.676 m), weight 170 lb 13.7 oz (77.5 kg), SpO2 96 %.   General appearance: alert, cooperative and no distress Heart: regular rate and rhythm Lungs: clear Abdomen:  benign Extremities: no edema or calf tenderness Wound: incis healing well   Disposition: 01-Home or Self Care  Discharge Instructions    Discharge patient   Complete by:  As directed    Discharge disposition:  01-Home or Self Care   Discharge patient date:  10/02/2017     Allergies as of 10/02/2017      Reactions   Amoxicillin    UNSPECIFIED REACTION  Has patient had a PCN reaction causing immediate rash, facial/tongue/throat swelling, SOB or lightheadedness with hypotension: No Has patient had a PCN reaction causing severe rash involving mucus membranes or skin necrosis: No Has patient had a PCN reaction that required hospitalization No Has patient had a PCN reaction occurring within the last 10 years: No If all of the above answers are "NO", then may proceed with Cephalosporin use.   Ampicillin Rash   Has patient had a PCN reaction causing immediate rash, facial/tongue/throat swelling, SOB or lightheadedness with hypotension: No Has patient had a PCN reaction causing severe rash involving mucus membranes or skin necrosis: No Has patient had a PCN reaction that required hospitalization No Has patient had a PCN reaction occurring within the last 10 years: No If all of  the above answers are "NO", then may proceed with Cephalosporin use.      Medication List    TAKE these medications   ALPRAZolam 0.5 MG tablet Commonly known as:  XANAX Take 0.5 mg by mouth 2 (two) times daily as needed for anxiety.   atorvastatin 10 MG tablet Commonly known as:  LIPITOR Take 1 tablet (10 mg total) by mouth daily.   buPROPion 150 MG 12 hr tablet Commonly known as:  WELLBUTRIN SR Take 1 tablet (150 mg total) by mouth daily.   cyclobenzaprine 5 MG tablet Commonly known as:  FLEXERIL Take 1 tablet (5 mg total) by mouth 3 (three) times daily as needed. For spasms   diclofenac sodium 1 % Gel Commonly known as:  VOLTAREN Apply 1 application topically 3 (three) times daily as needed  (pain).   HAIR/SKIN/NAILS Tabs Take 1 tablet by mouth daily.   levothyroxine 100 MCG tablet Commonly known as:  SYNTHROID, LEVOTHROID TAKE 1 TABLET BY MOUTH BEFORE BREAKFAST MONDAY THRU FRIDAY, SKIP SATURDAY AND SUNDAY   morphine 30 MG 12 hr tablet Commonly known as:  MS CONTIN Take 30 mg by mouth every 8 (eight) hours as needed for pain.   polyethylene glycol packet Commonly known as:  MIRALAX / GLYCOLAX Take 17 g by mouth 2 (two) times daily. What changed:    when to take this  reasons to take this   PROAIR HFA 108 (90 Base) MCG/ACT inhaler Generic drug:  albuterol INHALE 2 PUFFS EVERY 4 HOURS AS NEEDED FOR WHEEZING, COUGH OR SHORTNESS OF BREATH   traMADol 50 MG tablet Commonly known as:  ULTRAM Take 1-2 tablets (50-100 mg total) by mouth every 6 (six) hours as needed (mild pain).   triamterene-hydrochlorothiazide 75-50 MG tablet Commonly known as:  MAXZIDE Take 1 tablet by mouth daily.   vitamin B-12 1000 MCG tablet Commonly known as:  CYANOCOBALAMIN Take 1,000 mcg by mouth daily.   zolpidem 5 MG tablet Commonly known as:  AMBIEN TAKE 1 TABLET BY MOUTH AT BEDTIME AS NEEDED FOR SLEEP.      Follow-up Information    Melrose Nakayama, MD Follow up.   Specialty:  Cardiothoracic Surgery Why:  The office will contact you with a appointment to see the doctor as well as an appointment for suture removal by the nurse.  If you have not heard from them within 1 week please contact to arrange these appointments. Contact information: [address] [place] [address] [phone]           Signed: [name] Giovanni PA-C 10/02/2017, 1:03 PM

## 2017-10-02 NOTE — Care Management Note (Signed)
Case Management Note  Patient Details  Name: [name] MRN: [id number] Date of Birth: 26-Dec-1950  Subjective/Objective:   Status post (Right) Video assisted thoracoscopy (VATS)/wedge resection                Action/Plan: PTA PT lives at home with spouse.  Pt for discharge today. PCP noted.  No discharge needs noted.  Expected Discharge Date:  10/02/17               Expected Discharge Plan:  Home/Self Care  In-House Referral:  NA  Discharge planning Services  CM Consult  Post Acute Care Choice:  NA Choice offered to:  NA  DME Arranged:  N/A DME Agency:  NA  HH Arranged:  NA HH Agency:  NA  Status of Service:  Completed, signed off  If discussed at [place] of Stay Meetings, dates discussed:    Additional Comments:  Kristen Cardinal, RN 10/02/2017, 11:55 AM

## 2017-10-02 NOTE — Progress Notes (Signed)
[address]       [place] [address]             [phone]      4 Days Post-Op Procedure(s) (LRB): RIGHT VIDEO ASSISTED THORACOSCOPY (VATS)/WEDGE RESECTION, LYMPH NODE DISSECTION  (Right) Subjective: Feels well some numbness complaints around chest, had onQ  Objective: Vital signs in last 24 hours: Temp:  [97.6 F (36.4 C)-99.3 F (37.4 C)] 98.7 F (37.1 C) (12/11 0458) Pulse Rate:  [82-96] 82 (12/11 0458) Cardiac Rhythm: Sinus tachycardia (12/10 2317) Resp:  [13-18] 13 (12/11 0458) BP: (110-126)/(44-91) 125/78 (12/11 0458) SpO2:  [94 %-99 %] 95 % (12/11 0458)  Hemodynamic parameters for last 24 hours:    Intake/Output from previous day: 12/10 0701 - 12/11 0700 In: 240 [P.O.:240] Out: -  Intake/Output this shift: No intake/output data recorded.  General appearance: alert, cooperative and no distress Heart: regular rate and rhythm Lungs: clear Abdomen: benign Extremities: no edema or calf tenderness Wound: incis healing well  Lab Results: Recent Labs    09/30/17 0338  WBC 11.5*  HGB 11.3*  HCT 34.4*  PLT 125*   BMET:  Recent Labs    09/30/17 0338  NA 135  K 3.6  CL 101  CO2 26  GLUCOSE 127*  BUN 14  CREATININE 0.99  CALCIUM 8.5*    PT/INR: No results for input(s): LABPROT, INR in the last 72 hours. ABG    Component Value Date/Time   PHART 7.398 09/29/2017 0354   HCO3 27.4 09/29/2017 0354   TCO2 26.8 03/07/2008 0600   O2SAT 98.8 09/29/2017 0354   CBG (last 3)  Recent Labs    09/29/17 2327 09/30/17 0352 09/30/17 0814  GLUCAP 109* 112* 103*    Meds Scheduled Meds: . acetaminophen  1,000 mg Oral Q6H   Or  . acetaminophen (TYLENOL) oral liquid 160 mg/5 mL  1,000 mg Oral Q6H  . atorvastatin  10 mg Oral Daily  . bisacodyl  10 mg Oral Daily  . buPROPion  150 mg Oral Daily  . Chlorhexidine Gluconate Cloth  6 each Topical Q0600  . enoxaparin (LOVENOX) injection  40 mg Subcutaneous Daily  . levothyroxine  100  mcg Oral QAC breakfast  . morphine  30 mg Oral Q12H  . mupirocin ointment  1 application Nasal BID  . senna-docusate  1 tablet Oral QHS   Continuous Infusions: . dextrose 5 % and 0.45% NaCl 10 mL/hr at 09/30/17 0600  . potassium chloride     PRN Meds:.albuterol, ALPRAZolam, cyclobenzaprine, diclofenac sodium, diphenhydrAMINE **OR** diphenhydrAMINE, naloxone **AND** sodium chloride flush, ondansetron (ZOFRAN) IV, oxyCODONE, polyethylene glycol, potassium chloride, potassium chloride, traMADol, zolpidem  Xrays Dg Chest Port 1 View  Result Date: 10/01/2017 CLINICAL DATA:  Post thoracostomy EXAM: PORTABLE CHEST 1 VIEW COMPARISON:  Portable exam 9379 hours compared to 09/30/2017 FINDINGS: Interval removal of RIGHT thoracostomy tube. Small residual RIGHT apex pneumothorax slightly larger than on previous exam. Mild enlargement of cardiac silhouette. Atherosclerotic calcification aorta. Atelectasis and postsurgical changes in RIGHT upper lobe with minimal LEFT basilar atelectasis. No pleural effusion or new infiltrate. Bones demineralized with LEFT shoulder prosthesis noted. IMPRESSION: Slight increase in size of RIGHT apex pneumothorax following thoracostomy tube removal. Postsurgical changes and atelectasis in RIGHT upper lobe with minimal LEFT basilar atelectasis. Electronically Signed   By: Lavonia Dana M.D.   On: 10/01/2017 08:22    Assessment/Plan: S/P Procedure(s) (LRB): RIGHT VIDEO ASSISTED THORACOSCOPY (VATS)/WEDGE RESECTION, LYMPH NODE DISSECTION  (Right)  Plan for discharge: see discharge orders Path pending Some tachycardia at times burt mostly well controlled No new xrays or labs Ambulating well  LOS: 4 days    [name] 10/02/2017

## 2017-10-03 ENCOUNTER — Telehealth: Payer: Self-pay

## 2017-10-03 NOTE — Telephone Encounter (Signed)
Transition Care Management Follow-up Telephone Call   Date discharged? 10/02/17   How have you been since you were released from the hospital? Patient states that she is feeling better. Just waiting on her biopsy results.    Do you understand why you were in the hospital? yes   Do you understand the discharge instructions? yes   Where were you discharged to? home   Items Reviewed:  Medications reviewed: yes  Allergies reviewed: yes  Dietary changes reviewed: yes  Referrals reviewed: yes   Functional Questionnaire:   Activities of Daily Living (ADLs):   She states they are independent in the following: ambulation, bathing and hygiene, feeding, continence, grooming, toileting and dressing States they require assistance with the following: none   Any transportation issues/concerns?: no   Any patient concerns? no   Confirmed importance and date/time of follow-up visits scheduled yes Provider Appointment booked with Dr. Nolon Rod on 10/17/17 @ 11:40 am.  Confirmed with patient if condition begins to worsen call PCP or go to the ER.  Patient was given the office number and encouraged to call back with question or concerns.  : yes

## 2017-10-09 ENCOUNTER — Telehealth: Payer: Self-pay | Admitting: Family Medicine

## 2017-10-09 ENCOUNTER — Ambulatory Visit (INDEPENDENT_AMBULATORY_CARE_PROVIDER_SITE_OTHER): Payer: Self-pay

## 2017-10-09 DIAGNOSIS — R911 Solitary pulmonary nodule: Secondary | ICD-10-CM

## 2017-10-09 DIAGNOSIS — Z4802 Encounter for removal of sutures: Secondary | ICD-10-CM

## 2017-10-09 NOTE — Telephone Encounter (Signed)
Called pt to reschedule her appt with Dr. Nolon Rod on 10/17/17. Dr. Nolon Rod will not be in the office that day so we need to reschedule.   Please reschedule her for any day other than 10/17/17 and 10/22/17.  Thanks!

## 2017-10-09 NOTE — Progress Notes (Signed)
Patient presents for suture removal. The wound is well healed without signs of infection.  The sutures are removed. Wound care and activity instructions given. Return prn.

## 2017-10-17 ENCOUNTER — Ambulatory Visit: Payer: Self-pay | Admitting: Family Medicine

## 2017-10-18 DIAGNOSIS — M549 Dorsalgia, unspecified: Secondary | ICD-10-CM | POA: Diagnosis not present

## 2017-10-18 DIAGNOSIS — Z6826 Body mass index (BMI) 26.0-26.9, adult: Secondary | ICD-10-CM | POA: Diagnosis not present

## 2017-10-18 DIAGNOSIS — R030 Elevated blood-pressure reading, without diagnosis of hypertension: Secondary | ICD-10-CM | POA: Diagnosis not present

## 2017-10-19 ENCOUNTER — Other Ambulatory Visit: Payer: Self-pay | Admitting: Thoracic Surgery (Cardiothoracic Vascular Surgery)

## 2017-10-19 DIAGNOSIS — R911 Solitary pulmonary nodule: Secondary | ICD-10-CM

## 2017-10-22 ENCOUNTER — Ambulatory Visit
Admission: RE | Admit: 2017-10-22 | Discharge: 2017-10-22 | Disposition: A | Discharge status: Home / Self Care | Payer: Medicare Other | Source: Ambulatory Visit | Attending: Cardiothoracic Surgery | Admitting: Cardiothoracic Surgery

## 2017-10-22 ENCOUNTER — Ambulatory Visit (INDEPENDENT_AMBULATORY_CARE_PROVIDER_SITE_OTHER): Payer: Self-pay | Admitting: Thoracic Surgery (Cardiothoracic Vascular Surgery)

## 2017-10-22 ENCOUNTER — Other Ambulatory Visit: Payer: Self-pay

## 2017-10-22 ENCOUNTER — Encounter: Payer: Self-pay | Admitting: Thoracic Surgery (Cardiothoracic Vascular Surgery)

## 2017-10-22 VITALS — BP 136/80 | HR 113 | Ht 66.0 in | Wt 161.0 lb

## 2017-10-22 DIAGNOSIS — C3491 Malignant neoplasm of unspecified part of right bronchus or lung: Secondary | ICD-10-CM

## 2017-10-22 DIAGNOSIS — R911 Solitary pulmonary nodule: Secondary | ICD-10-CM

## 2017-10-22 NOTE — Progress Notes (Signed)
[address]       [place] [address]             [phone]    HPI: [name] returns for a scheduled postoperative follow-up visit  [name] is a 66 year old woman with a remote history of tobacco abuse.  She also has a past medical history significant for depression, anxiety, CHF, melanoma, renal cell carcinoma, ovarian cancer, fibromyalgia, chronic pain, chronic kidney disease, and venous insufficiency.  She is being followed for a lung nodule that showed an increase in size.  On PET CT it was mildly hypermetabolic with an SUV of 1.9.  I did a right VATS and wedge resection and lymph node sampling on 09/28/2017.  Her postoperative course was uncomplicated and she went home on postoperative day 4.  She says she is doing well.  She has some soreness but is not having to take any pain medication currently.  She has not had any significant shortness of breath.  Past Medical History:  Diagnosis Date  . Anemia   . Anxiety   . Arthritis    "everywhere" (08/19/2013)  . Bilateral carpal tunnel syndrome   . Chest pain at rest, rule out pericarditis 11/27/2012  . CHF (congestive heart failure) ([place])   . Chronic kidney disease    dr Risa Grill  . Chronic lower back pain   . Complication of anesthesia    "woke up twice during knee replacement" (08/19/2013)  . Depression   . Dyspnea    w/ exertion    . Fibromyalgia   . Headache(784.0)    hx of migraines   . Heart murmur   . History of pericarditis, with pericardial window in 2009 11/27/2012  . Hx of melanoma of skin 11/27/2012  . Hx of ovarian cancer 11/27/2012  . Hypothyroidism 11/27/2012  . Iron deficiency anemia   . Kidney carcinoma ([place])    "both kidneys; ~ 3 yr apart" (08/19/2013)  . Melanoma of back ([place])   . Migraines   . Pneumonia    "several times; including today", RUL/notes 08/19/2013  . SOB (shortness of breath)    occasional with exertion   . Squamous carcinoma    "face, side of my nose, chest; used  acid to get rid of them" (08/19/2013)  . Venous insufficiency 11/27/2012    Current Outpatient Medications  Medication Sig Dispense Refill  . ALPRAZolam (XANAX) 0.5 MG tablet Take 0.5 mg by mouth 2 (two) times daily as needed for anxiety.    Marland Kitchen atorvastatin (LIPITOR) 10 MG tablet Take 1 tablet (10 mg total) by mouth daily. 90 tablet 3  . buPROPion (WELLBUTRIN SR) 150 MG 12 hr tablet Take 1 tablet (150 mg total) by mouth daily. 30 tablet 11  . cyclobenzaprine (FLEXERIL) 5 MG tablet Take 1 tablet (5 mg total) by mouth 3 (three) times daily as needed. For spasms 30 tablet 0  . diclofenac sodium (VOLTAREN) 1 % GEL Apply 1 application topically 3 (three) times daily as needed (pain).    Marland Kitchen levothyroxine (SYNTHROID, LEVOTHROID) 100 MCG tablet TAKE 1 TABLET BY MOUTH BEFORE BREAKFAST MONDAY THRU FRIDAY, SKIP SATURDAY AND SUNDAY 30 tablet 0  . morphine (MS CONTIN) 30 MG 12 hr tablet Take 30 mg by mouth every 8 (eight) hours as needed for pain.     . Multiple Vitamins-Minerals (HAIR/SKIN/NAILS) TABS Take 1 tablet by mouth daily.    . polyethylene glycol (MIRALAX / GLYCOLAX) packet Take 17 g by mouth  2 (two) times daily. (Patient taking differently: Take 17 g by mouth daily as needed for moderate constipation. ) 14 each 0  . PROAIR HFA 108 (90 BASE) MCG/ACT inhaler INHALE 2 PUFFS EVERY 4 HOURS AS NEEDED FOR WHEEZING, COUGH OR SHORTNESS OF BREATH 8.5 g 3  . traMADol (ULTRAM) 50 MG tablet Take 1-2 tablets (50-100 mg total) by mouth every 6 (six) hours as needed (mild pain). 30 tablet 0  . triamterene-hydrochlorothiazide (MAXZIDE) 75-50 MG tablet Take 1 tablet by mouth daily.    . vitamin B-12 (CYANOCOBALAMIN) 1000 MCG tablet Take 1,000 mcg by mouth daily.    Marland Kitchen zolpidem (AMBIEN) 5 MG tablet TAKE 1 TABLET BY MOUTH AT BEDTIME AS NEEDED FOR SLEEP. 30 tablet 2   No current facility-administered medications for this visit.     Physical Exam BP 136/80   Pulse (!) 113   Ht 5\' 6"  (1.676 m)   Wt 161 lb (73 kg)    SpO2 91%   BMI 25.70 kg/m  66 year old woman in no acute distress Alert and oriented x3 with no focal deficits Lungs clear with equal breath sounds bilaterally Incisions healing well No peripheral edema  Diagnostic Tests: CHEST  2 VIEW  COMPARISON:  Postoperative radiographs 10/01/2017 and earlier.  FINDINGS: Expected decreased volume in the right hemithorax. The small right pneumothorax has resolved. Curvilinear staple line projects in the upper third of the right lung with mild adjacent hazy opacity. No right pleural effusion identified. Mediastinal contours remain normal. Visualized tracheal air column is within normal limits. Stable left lung.  Stable visualized osseous structures, including left shoulder arthroplasty and previous lower thoracic or upper lumbar vertebral augmentation. Negative visible bowel gas pattern.  IMPRESSION: Satisfactory postoperative appearance of the right chest. No acute cardiopulmonary abnormality.   Electronically Signed   By: Genevie Ann M.D.   On: 10/22/2017 14:47 I personally reviewed the chest x-ray images and concur with the findings noted above  Impression: [name] is a 66 year old woman who had a well differentiated adenocarcinoma removed with a wedge resection.  Her nodes were all negative.  She has clinical and pathologic stage IA disease.  She is recovering well from her surgery with no complications.  She has minimal discomfort.  She has not had any respiratory problems.  At this point her activities are unrestricted.  She was cautioned to build into new activities gradually.  She was advised to be very cautious with driving and avoid high speeds in heavy traffic.  We will refer her to our multidisciplinary thoracic oncology clinic for oncology consultation.  She will not need any adjuvant therapy.  She will need continued follow-up.  Plan: Referral to [facility]  Return in 6 months with PA and lateral chest x-ray  Melrose Nakayama, MD [facility] [phone]

## 2017-10-25 DIAGNOSIS — G5603 Carpal tunnel syndrome, bilateral upper limbs: Secondary | ICD-10-CM | POA: Diagnosis not present

## 2017-10-25 DIAGNOSIS — M170 Bilateral primary osteoarthritis of knee: Secondary | ICD-10-CM | POA: Diagnosis not present

## 2017-10-25 DIAGNOSIS — G894 Chronic pain syndrome: Secondary | ICD-10-CM | POA: Diagnosis not present

## 2017-10-25 DIAGNOSIS — M67919 Unspecified disorder of synovium and tendon, unspecified shoulder: Secondary | ICD-10-CM | POA: Diagnosis not present

## 2017-10-29 ENCOUNTER — Telehealth: Payer: Self-pay | Admitting: *Deleted

## 2017-10-29 ENCOUNTER — Ambulatory Visit: Payer: Medicare Other | Admitting: Emergency Medicine

## 2017-10-29 NOTE — Telephone Encounter (Signed)
Oncology Nurse Navigator Documentation  Oncology Nurse Navigator Flowsheets 10/29/2017  Navigator Location [facility]  Referral date to RadOnc/MedOnc 10/25/2017  Navigator Encounter Type Telephone/I received referral from Dr. Leonarda Salon office. I called to schedule. She verbalized understanding of appt time and place.   Telephone Outgoing Call  Treatment Phase Other  Barriers/Navigation Needs Coordination of Care  Interventions Coordination of Care  Coordination of Care Appts  Acuity Level 2  Acuity Level 2 Assistance expediting appointments  Time Spent with Patient 30

## 2017-10-31 ENCOUNTER — Other Ambulatory Visit: Payer: Self-pay | Admitting: Medical Oncology

## 2017-10-31 ENCOUNTER — Telehealth: Payer: Self-pay | Admitting: Internal Medicine

## 2017-10-31 DIAGNOSIS — C3491 Malignant neoplasm of unspecified part of right bronchus or lung: Secondary | ICD-10-CM

## 2017-10-31 NOTE — Telephone Encounter (Signed)
Spoke with patient to confirm 11/01/17 Lung Clinic appointment

## 2017-11-01 ENCOUNTER — Encounter: Payer: Self-pay | Admitting: General Practice

## 2017-11-01 ENCOUNTER — Encounter: Payer: Self-pay | Admitting: Internal Medicine

## 2017-11-01 ENCOUNTER — Telehealth: Payer: Self-pay | Admitting: Internal Medicine

## 2017-11-01 ENCOUNTER — Inpatient Hospital Stay: Payer: Medicare Other | Attending: Internal Medicine | Admitting: Internal Medicine

## 2017-11-01 ENCOUNTER — Inpatient Hospital Stay: Payer: Medicare Other

## 2017-11-01 ENCOUNTER — Other Ambulatory Visit: Payer: Self-pay | Admitting: *Deleted

## 2017-11-01 ENCOUNTER — Ambulatory Visit: Payer: Medicare Other | Attending: Internal Medicine | Admitting: Physical Therapy

## 2017-11-01 DIAGNOSIS — C3491 Malignant neoplasm of unspecified part of right bronchus or lung: Secondary | ICD-10-CM | POA: Diagnosis not present

## 2017-11-01 DIAGNOSIS — N189 Chronic kidney disease, unspecified: Secondary | ICD-10-CM

## 2017-11-01 DIAGNOSIS — Z87891 Personal history of nicotine dependence: Secondary | ICD-10-CM | POA: Diagnosis not present

## 2017-11-01 DIAGNOSIS — M549 Dorsalgia, unspecified: Secondary | ICD-10-CM | POA: Diagnosis not present

## 2017-11-01 DIAGNOSIS — F418 Other specified anxiety disorders: Secondary | ICD-10-CM

## 2017-11-01 DIAGNOSIS — R2681 Unsteadiness on feet: Secondary | ICD-10-CM | POA: Diagnosis not present

## 2017-11-01 DIAGNOSIS — R293 Abnormal posture: Secondary | ICD-10-CM | POA: Insufficient documentation

## 2017-11-01 DIAGNOSIS — Z8543 Personal history of malignant neoplasm of ovary: Secondary | ICD-10-CM

## 2017-11-01 DIAGNOSIS — I509 Heart failure, unspecified: Secondary | ICD-10-CM

## 2017-11-01 DIAGNOSIS — C3411 Malignant neoplasm of upper lobe, right bronchus or lung: Secondary | ICD-10-CM | POA: Diagnosis not present

## 2017-11-01 DIAGNOSIS — C3490 Malignant neoplasm of unspecified part of unspecified bronchus or lung: Secondary | ICD-10-CM

## 2017-11-01 DIAGNOSIS — Z85820 Personal history of malignant melanoma of skin: Secondary | ICD-10-CM | POA: Diagnosis not present

## 2017-11-01 DIAGNOSIS — R29898 Other symptoms and signs involving the musculoskeletal system: Secondary | ICD-10-CM | POA: Insufficient documentation

## 2017-11-01 LAB — CMP (CANCER CENTER ONLY)
ALT: 12 U/L (ref 0–55)
AST: 16 U/L (ref 5–34)
Albumin: 3.8 g/dL (ref 3.5–5.0)
Alkaline Phosphatase: 113 U/L (ref 40–150)
Anion gap: 10 (ref 3–11)
BUN: 37 mg/dL — ABNORMAL HIGH (ref 7–26)
CO2: 27 mmol/L (ref 22–29)
Calcium: 9.8 mg/dL (ref 8.4–10.4)
Chloride: 103 mmol/L (ref 98–109)
Creatinine: 1.38 mg/dL — ABNORMAL HIGH (ref 0.60–1.10)
GFR, Est AFR Am: 44 mL/min — ABNORMAL LOW (ref >60)
GFR, Estimated: 38 mL/min — ABNORMAL LOW (ref >60)
Glucose, Bld: 95 mg/dL (ref 70–140)
Potassium: 4.2 mmol/L (ref 3.3–4.7)
Sodium: 140 mmol/L (ref 136–145)
Total Bilirubin: 0.3 mg/dL (ref 0.2–1.2)
Total Protein: 7.6 g/dL (ref 6.4–8.3)

## 2017-11-01 LAB — CBC WITH DIFFERENTIAL (CANCER CENTER ONLY)
Basophils Absolute: 0.1 10*3/uL (ref 0.0–0.1)
Basophils Relative: 1 %
Eosinophils Absolute: 0.2 10*3/uL (ref 0.0–0.5)
Eosinophils Relative: 3 %
HCT: 36.2 % (ref 34.8–46.6)
Hemoglobin: 11.9 g/dL (ref 11.6–15.9)
Lymphocytes Relative: 31 %
Lymphs Abs: 2 10*3/uL (ref 0.9–3.3)
MCH: 29.3 pg (ref 25.1–34.0)
MCHC: 32.8 g/dL (ref 31.5–36.0)
MCV: 89.4 fL (ref 79.5–101.0)
Monocytes Absolute: 0.3 10*3/uL (ref 0.1–0.9)
Monocytes Relative: 5 %
Neutro Abs: 3.8 10*3/uL (ref 1.5–6.5)
Neutrophils Relative %: 60 %
Platelet Count: 157 10*3/uL (ref 145–400)
RBC: 4.06 MIL/uL (ref 3.70–5.45)
RDW: 13.5 % (ref 11.2–16.1)
WBC Count: 6.4 10*3/uL (ref 3.9–10.3)

## 2017-11-01 NOTE — Progress Notes (Signed)
[facility] Psychosocial Distress Screening Clinical Social Work  Clinical Social Work was referred by distress screening protocol.  The patient scored a 4 on the Psychosocial Distress Thermometer which indicates mild distress. Clinical Social Worker Edwyna Shell to assess for distress and other psychosocial needs. CSW and patient discussed common feeling and emotions when being diagnosed with cancer, and the importance of support during treatment. CSW informed patient of the support team and support services at [facility]. CSW provided contact information and encouraged patient to call with any questions or concerns.  Patient expressed relief because she had been given positive news during her appointment - is not anticipating further issuues, will only need to return for screenings.  Expressed confidence in current condition, is kidney cancer survivor since early 2000s.  Wants to participate in activities for stress management, interested in [place].  Given information packet and encouraged to access resources as needed.   ONCBCN DISTRESS SCREENING 11/01/2017  Distress experienced in past week (1-10) 4  Emotional problem type Nervousness/Anxiety  Spiritual/Religous concerns type (No Data)  Physical Problem type Pain;Tingling hands/feet    Clinical Social Worker follow up needed: No.  If yes, follow up plan:  Edwyna Shell, LCSW Clinical Social Worker Phone:  [phone]

## 2017-11-01 NOTE — Telephone Encounter (Signed)
Scheduled appt per 1/10 los- Gave patient AVS and calender per los. Central radiololgy to contact patient with ct scan

## 2017-11-01 NOTE — Therapy (Signed)
[place], [place], [address] Phone: [phone]   Fax:  [phone]  Physical Therapy Evaluation  Patient Details  Name: [name] MRN: [id number] Date of Birth: Jun 29, 1951 Referring Provider: Dr. Curt Bears   Encounter Date: 11/01/2017  PT End of Session - 11/01/17 1840    Visit Number  1    Number of Visits  1    PT Start Time  9163    PT Stop Time  1608    PT Time Calculation (min)  25 min    Activity Tolerance  Patient tolerated treatment well    Behavior During Therapy  [facility] for tasks assessed/performed       Past Medical History:  Diagnosis Date  . Anemia   . Anxiety   . Arthritis    "everywhere" (08/19/2013)  . Bilateral carpal tunnel syndrome   . Chest pain at rest, rule out pericarditis 11/27/2012  . CHF (congestive heart failure) ([place])   . Chronic kidney disease    dr Risa Grill  . Chronic lower back pain   . Complication of anesthesia    "woke up twice during knee replacement" (08/19/2013)  . Depression   . Dyspnea    w/ exertion    . Fibromyalgia   . Headache(784.0)    hx of migraines   . Heart murmur   . History of pericarditis, with pericardial window in 2009 11/27/2012  . Hx of melanoma of skin 11/27/2012  . Hx of ovarian cancer 11/27/2012  . Hypothyroidism 11/27/2012  . Iron deficiency anemia   . Kidney carcinoma ([place])    "both kidneys; ~ 3 yr apart" (08/19/2013)  . Melanoma of back ([place])   . Migraines   . Pneumonia    "several times; including today", RUL/notes 08/19/2013  . SOB (shortness of breath)    occasional with exertion   . Squamous carcinoma    "face, side of my nose, chest; used acid to get rid of them" (08/19/2013)  . Venous insufficiency 11/27/2012    Past Surgical History:  Procedure Laterality Date  . ABDOMINAL HYSTERECTOMY  1979  . APPENDECTOMY    . DOPPLER ECHOCARDIOGRAPHY  02/23/2011   LVEF>50%  . FRACTURE SURGERY    . JOINT REPLACEMENT     left hip as child  x2  . KNEE ARTHROSCOPY Right    "twice before the replacement" (08/19/2013)  . LEFT OOPHORECTOMY Left 1979  . [place]   "ruptured disc"  . MELANOMA EXCISION  ~ 2010   "my lower back" (08/19/2013)  . PARTIAL NEPHRECTOMY Right 2005; ~ 2008  . PERICARDIAL WINDOW  ~ 2012  . POSTERIOR LUMBAR FUSION  2002?; 03/2012  . REDUCTION MAMMAPLASTY Bilateral ~ 2008  . RIGHT OOPHORECTOMY  ~ 2007   "it had cancer in it" (08/19/2013)  . SHOULDER ARTHROSCOPY W/ ROTATOR CUFF REPAIR Right 1990's  . THORACOSCOPY Right 09/28/2017   VIDEO ASSISTED  . TONSILLECTOMY  1954  . TOTAL KNEE ARTHROPLASTY Right 1990's  . TOTAL KNEE ARTHROPLASTY Left 08/02/2015   Procedure: LEFT TOTAL KNEE ARTHROPLASTY;  Surgeon: Paralee Cancel, MD;  Location: [facility];  Service: Orthopedics;  Laterality: Left;  . TOTAL SHOULDER REPLACEMENT Left 2006?  . TUBAL LIGATION  1952  . VIDEO ASSISTED THORACOSCOPY (VATS)/WEDGE RESECTION Right 09/28/2017   Procedure: RIGHT VIDEO ASSISTED THORACOSCOPY (VATS)/WEDGE RESECTION, LYMPH NODE DISSECTION ;  Surgeon: Melrose Nakayama, MD;  Location: [facility];  Service: Thoracic;  Laterality: Right;  There were no vitals filed for this visit.   Subjective Assessment - 11/01/17 1824    Subjective  I have arthritis and fibromyalgia, so I'm never painfree.    Patient is accompained by:  Family member daughter    Pertinent History  Pt. had been followed due to a pulmonary nodule noted in 2015; now diagnosed with RUL adenocarcinoma, 1 cm. in size.  She had VATS wedge resection 09/28/17 and will not require further treatment, but will be followed by oncology. Medical history includes melanoma 2014 s/p excision, pericarditis x 3 x/p window and methotrexate; renal cell carcinoma s/p partial right and left nephrectomy; ovarian cancer; fibromyalgia; chronic LBP with h/o lumbar fusion; right TKA in 1990s and left TKA 08/02/2015; Rt. RTC repair (arthroscopic) 1990s; bilateral CTS;  CHF; ex-smoker; left total shoulder replacement    Patient Stated Goals  get info from all lung clinic providers    Currently in Pain?  Yes    Pain Score  4     Pain Location  Back and joints    Pain Descriptors / Indicators  Aching;Sore    Pain Type  Chronic pain    Aggravating Factors   right upper flank worse with reaching, touching    Pain Relieving Factors  medication         [facility] PT Assessment - 11/01/17 0001      Assessment   Medical Diagnosis  right upper lobe adenocarcinoma    Referring Provider  Dr. Curt Bears    Onset Date/Surgical Date  09/28/17 VATS wedge resection    Prior Therapy  none      Precautions   Precautions  Other (comment)    Precaution Comments  cancer precautions      Restrictions   Weight Bearing Restrictions  No      Balance Screen   Has the patient fallen in the past 6 months  No but has fallen longer ago    Has the patient had a decrease in activity level because of a fear of falling?   No    Is the patient reluctant to leave their home because of a fear of falling?   No      Home Film/video editor residence    Living Arrangements  Spouse/significant other    Type of [place]  One level      Prior Function   Level of [place]  reports she walks her dog daily, about 3 blocks out and back; does gardening in summertime      Cognition   Overall Cognitive Status  Within Functional Limits for tasks assessed      Coordination   Gross Motor Movements are Fluid and Coordinated  Yes      Functional Tests   Functional tests  Sit to Stand      Sit to Stand   Comments  9 times in 30 seconds, below average for age mild dyspnea following      Posture/Postural Control   Posture/Postural Control  Postural limitations    Postural Limitations  Forward head;Rounded Shoulders very significant forward head posture in sitting unsupported      ROM / Strength   AROM / PROM /  Strength  AROM      AROM   Overall AROM Comments  trunk AROM in standing:  flexion and bilat. sidebend WFL; extension 30% loss, rotation 20% loss bilat. was unsteady  with doing this      Ambulation/Gait   Ambulation/Gait  Yes    Ambulation/Gait Assistance  7: Independent      Balance   Balance Assessed  Yes      Dynamic Standing Balance   Dynamic Standing - Comments  reaches forward 12 inches in standing, a little below average for age             Objective measurements completed on examination: See above findings.              PT Education - 11/01/17 1838    Education provided  Yes    Education Details  walking, "Why exercise?" flyer, Prehab exercise class info, posture, breathing, PT info; info on Tai Chi and yoga opportunities    Person(s) Educated  Patient;Child(ren)    Methods  Explanation;Handout    Comprehension  Verbalized understanding            Lung Clinic Goals - 11/01/17 1846      Patient will be able to verbalize understanding of the benefit of exercise to decrease fatigue.   Status  Achieved      Patient will be able to verbalize the importance of posture.   Status  Achieved      Patient will be able to demonstrate diaphragmatic breathing for improved lung function.   Status  Achieved      Patient will be able to verbalize understanding of the role of physical therapy to prevent functional decline and who to contact if physical therapy is needed.   Status  Achieved           Plan - 11/01/17 1840    Clinical Impression Statement  This is a woman with a h/o multiple cancers, now diagnosed with right upper lobe adenocarcinoma.  She underwent wedge resection and no further treatment is planned right now.  She has chronic pain, trunk ROM limitations, unsteadiness on feet, below average sit to stand in 30 seconds.    History and Personal Factors relevant to plan of care:  h/o melanoma, ovarian cancer, renal cell carcinoma; fibromyalgia,  chronic LBP    Clinical Presentation  Stable    Clinical Decision Making  Low    Rehab Potential  Good    PT Frequency  One time visit    PT Next Visit Plan  none planned at this time    PT Home Exercise Plan  walking, breathing exercise    Recommended Other Services  suggested Prehab exercise class, Tai Chi, yoga at [facility] or [facility]    Consulted and Agree with Plan of Care  Patient       Patient will benefit from skilled therapeutic intervention in order to improve the following deficits and impairments:  Cardiopulmonary status limiting activity, Decreased activity tolerance, Decreased balance, Decreased range of motion, Postural dysfunction  Visit Diagnosis: Primary lung adenocarcinoma, right ([place]) - Plan: PT plan of care cert/re-cert  Abnormal posture - Plan: PT plan of care cert/re-cert  Unsteadiness on feet - Plan: PT plan of care cert/re-cert  Other symptoms and signs involving the musculoskeletal system - Plan: PT plan of care cert/re-cert     Problem List Patient Active Problem List   Diagnosis Date Noted  . Adenocarcinoma of right lung, stage 1 ([place]) 10/22/2017  . S/P left TKA 08/02/2015  . S/P knee replacement 08/02/2015  . Carpal tunnel syndrome 04/23/2015  . Autoimmune disease ([place]) 03/29/2013  . Apnea, sleep 03/29/2013  . Anxiety 11/28/2012  .  Seizure disorder ([place]) 11/28/2012  . Anxiety state 11/28/2012  . History of pericarditis, with pericardial window in 2009, and recurrent episodes 2011,2013, treated with methotrexate 11/27/2012  . SOB (shortness of breath) 11/27/2012  . Hypothyroidism 11/27/2012  . History of partial nephrectomy, both rt. and lt 11/27/2012  . Hx of ovarian cancer 11/27/2012  . Venous insufficiency 11/27/2012  . Chronic back pain, hx of lumbar spondylosis and stenosis L5-S1 with hx decompression and total diskectomy 11/27/2012  . Hx of melanoma of skin 11/27/2012  . 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 Chronic  venous insufficiency 11/27/2012  . DIARRHEA-PRESUMED INFECTIOUS 12/27/2009    SALIS[place],DONNA 11/01/2017, 6:49 PM  [place], [place], [address] Phone: [phone]   Fax:  [phone]  Name: [name] MRN: [id number] Date of Birth: 1951/04/16  Serafina Royals, PT 11/01/17 6:50 PM

## 2017-11-01 NOTE — Progress Notes (Signed)
[name] [phone]   Fax:([phone] Multidisciplinary thoracic oncology clinic CONSULT NOTE  REFERRING PHYSICIAN: Dr. [place] [name]  REASON FOR CONSULTATION:  67 years old white female recently diagnosed with lung cancer.  HPI [name] is a 67 y.o. female with past medical history significant for anemia, osteoarthritis, congestive heart failure, chronic kidney disease, chronic low back pain, depression and anxiety.  The patient has a history of pulmonary nodules dated back to 2015.  She had CT scan of the chest on July 2015 and it showed 6 mm nodule noted in the posterior portion of the right upper lobe.  The patient had several CT imaging of the chest and the nodule has been stable.  Repeat CT scan of the chest on 07/13/2017 showed a slowly enlarging groundglass nodule in the right upper lobe suspicious for low-grade adenocarcinoma.  A PET scan was performed on August 21, 2017 and that showed 1.1 cm groundglass nodule in the posterior right upper lobe with low-grade FDG uptake suspicious for low-grade adenocarcinoma.  There was no evidence of metastatic disease. The patient was referred to Dr. Roxan [name] and on September 28, 2017 she underwent wedge resection of the right upper lobe nodule with mediastinal lymph node dissection. The final pathology ([name]) showed well-differentiated adenocarcinoma spanning 1.0 cm.  The surgical resection margins were negative for adenocarcinoma and the dissected lymph nodes were negative for malignancy.  There was no evidence for visceral pleural involvement [name] lymphovascular invasion.  Dr. Roxan [name] kindly referred the patient to the multidisciplinary thoracic oncology clinic today for further evaluation and recommendation regarding her condition. When seen today the patient is feeling fine except for the soreness on the right side of the chest as well as shortness of breath with exertion but denied having any  cough [name] hemoptysis.  She intentionally trying to lose some weight recently.  She denied having any nausea, vomiting, diarrhea [name] constipation.  She denied having any headache [name] visual changes. Family history significant for mother with stomach cancer and father with lung cancer. The patient is married and has 2 children.  She was accompanied today by her daughter [name].  She is currently retired and used to work in a Writer.  She has a history for smoking 1 pack/day for around 20 years and quit 32 years ago.  She has no history of alcohol [name] drug abuse.  HPI  Past Medical History:  Diagnosis Date  . Anemia   . Anxiety   . Arthritis    "everywhere" (08/19/2013)  . Bilateral carpal tunnel syndrome   . Chest pain at rest, rule out pericarditis 11/27/2012  . CHF (congestive heart failure) ([name])   . Chronic kidney disease    dr [name]  . Chronic lower back pain   . Complication of anesthesia    "woke up twice during knee replacement" (08/19/2013)  . Depression   . Dyspnea    w/ exertion    . Fibromyalgia   . Headache(784.0)    hx of migraines   . Heart murmur   . History of pericarditis, with pericardial window in 2009 11/27/2012  . Hx of melanoma of skin 11/27/2012  . Hx of ovarian cancer 11/27/2012  . Hypothyroidism 11/27/2012  . Iron deficiency anemia   . Kidney carcinoma ([name])    "both kidneys; ~ 3 yr apart" (08/19/2013)  . Melanoma of back ([name])   . Migraines   . Pneumonia    "several times; including today",  RUL/notes 08/19/2013  . SOB (shortness of breath)    occasional with exertion   . Squamous carcinoma    "face, side of my nose, chest; used acid to get rid of them" (08/19/2013)  . Venous insufficiency 11/27/2012    Past Surgical History:  Procedure Laterality Date  . ABDOMINAL HYSTERECTOMY  1979  . APPENDECTOMY    . DOPPLER ECHOCARDIOGRAPHY  02/23/2011   LVEF>50%  . FRACTURE SURGERY    . JOINT REPLACEMENT     left hip as child  x2  . KNEE ARTHROSCOPY Right     "twice before the replacement" (08/19/2013)  . LEFT OOPHORECTOMY Left 1979  . [place]   "ruptured disc"  . MELANOMA EXCISION  ~ 2010   "my lower back" (08/19/2013)  . PARTIAL NEPHRECTOMY Right 2005; ~ 2008  . PERICARDIAL WINDOW  ~ 2012  . POSTERIOR LUMBAR FUSION  2002?; 03/2012  . REDUCTION MAMMAPLASTY Bilateral ~ 2008  . RIGHT OOPHORECTOMY  ~ 2007   "it had cancer in it" (08/19/2013)  . SHOULDER ARTHROSCOPY W/ ROTATOR CUFF REPAIR Right 1990's  . THORACOSCOPY Right 09/28/2017   VIDEO ASSISTED  . TONSILLECTOMY  1954  . TOTAL KNEE ARTHROPLASTY Right 1990's  . TOTAL KNEE ARTHROPLASTY Left 08/02/2015   Procedure: LEFT TOTAL KNEE ARTHROPLASTY;  Surgeon: Tina Cancel, MD;  Location: [facility];  Service: Orthopedics;  Laterality: Left;  . TOTAL SHOULDER REPLACEMENT Left 2006?  . TUBAL LIGATION  1952  . VIDEO ASSISTED THORACOSCOPY (VATS)/WEDGE RESECTION Right 09/28/2017   Procedure: RIGHT VIDEO ASSISTED THORACOSCOPY (VATS)/WEDGE RESECTION, LYMPH NODE DISSECTION ;  Surgeon: Melrose Nakayama, MD;  Location: [name];  Service: Thoracic;  Laterality: Right;    Family History  Problem Relation Age of Onset  . Cancer Mother   . Cancer Father   . Bipolar disorder Sister     Social History Social History   Tobacco Use  . Smoking status: Former Smoker    Packs/day: 1.50    Years: 18.00    Pack years: 27.00    Types: Cigarettes    Last attempt to quit: 09/22/1985    Years since quitting: 32.1  . Smokeless tobacco: Never Used  Substance Use Topics  . Alcohol use: No  . Drug use: No    Comment: prescribed    Allergies  Allergen Reactions  . Amoxicillin     UNSPECIFIED REACTION  Has patient had a PCN reaction causing immediate rash, facial/tongue/throat swelling, SOB [name] lightheadedness with hypotension: No Has patient had a PCN reaction causing severe rash involving mucus membranes [name] skin necrosis: No Has patient had a PCN reaction that required hospitalization  No Has patient had a PCN reaction occurring within the last 10 years: No If all of the above answers are "NO", then may proceed with Cephalosporin use.  . Ampicillin Rash    Has patient had a PCN reaction causing immediate rash, facial/tongue/throat swelling, SOB [name] lightheadedness with hypotension: No Has patient had a PCN reaction causing severe rash involving mucus membranes [name] skin necrosis: No Has patient had a PCN reaction that required hospitalization No Has patient had a PCN reaction occurring within the last 10 years: No If all of the above answers are "NO", then may proceed with Cephalosporin use.    Current Outpatient Medications  Medication Sig Dispense Refill  . ALPRAZolam (XANAX) 0.5 MG tablet Take 0.5 mg by mouth 2 (two) times daily as needed for anxiety.    Marland Kitchen atorvastatin (LIPITOR) 10 MG  tablet Take 1 tablet (10 mg total) by mouth daily. 90 tablet 3  . buPROPion (WELLBUTRIN SR) 150 MG 12 hr tablet Take 1 tablet (150 mg total) by mouth daily. 30 tablet 11  . cyclobenzaprine (FLEXERIL) 5 MG tablet Take 1 tablet (5 mg total) by mouth 3 (three) times daily as needed. For spasms 30 tablet 0  . diclofenac sodium (VOLTAREN) 1 % GEL Apply 1 application topically 3 (three) times daily as needed (pain).    Marland Kitchen levothyroxine (SYNTHROID, LEVOTHROID) 100 MCG tablet TAKE 1 TABLET BY MOUTH BEFORE BREAKFAST MONDAY THRU FRIDAY, SKIP SATURDAY AND SUNDAY 30 tablet 0  . morphine (MS CONTIN) 30 MG 12 hr tablet Take 30 mg by mouth every 8 (eight) hours as needed for pain.     . Multiple Vitamins-Minerals (HAIR/SKIN/NAILS) TABS Take 1 tablet by mouth daily.    . polyethylene glycol (MIRALAX / GLYCOLAX) packet Take 17 g by mouth 2 (two) times daily. (Patient taking differently: Take 17 g by mouth daily as needed for moderate constipation. ) 14 each 0  . PROAIR HFA 108 (90 BASE) MCG/ACT inhaler INHALE 2 PUFFS EVERY 4 HOURS AS NEEDED FOR WHEEZING, COUGH [name] SHORTNESS OF BREATH 8.5 g 3  . traMADol (ULTRAM)  50 MG tablet Take 1-2 tablets (50-100 mg total) by mouth every 6 (six) hours as needed (mild pain). 30 tablet 0  . triamterene-hydrochlorothiazide (MAXZIDE) 75-50 MG tablet Take 1 tablet by mouth daily.    . vitamin B-12 (CYANOCOBALAMIN) 1000 MCG tablet Take 1,000 mcg by mouth daily.    Marland Kitchen zolpidem (AMBIEN) 5 MG tablet TAKE 1 TABLET BY MOUTH AT BEDTIME AS NEEDED FOR SLEEP. 30 tablet 2   No current facility-administered medications for this visit.     Review of Systems  Constitutional: negative Eyes: negative Ears, nose, mouth, throat, and face: negative Respiratory: positive for dyspnea on exertion and pleurisy/chest pain Cardiovascular: negative Gastrointestinal: negative Genitourinary:negative Integument/breast: negative Hematologic/lymphatic: negative Musculoskeletal:negative Neurological: negative Behavioral/Psych: negative Endocrine: negative Allergic/Immunologic: negative  Physical Exam  YCX:[id number], healthy, no distress, well nourished and well developed SKIN: skin color, texture, turgor are normal, no rashes [name] significant lesions HEAD: Normocephalic, No masses, lesions, tenderness [name] abnormalities EYES: normal, PERRLA, Conjunctiva are pink and non-injected EARS: External ears normal, Canals clear OROPHARYNX:no exudate, no erythema and lips, buccal mucosa, and tongue normal  NECK: supple, no adenopathy, no JVD LYMPH:  no palpable lymphadenopathy, no hepatosplenomegaly BREAST:not examined LUNGS: clear to auscultation , and palpation HEART: regular rate & rhythm, no murmurs and no gallops ABDOMEN:abdomen soft, non-tender, normal bowel sounds and no masses [name] organomegaly BACK: Back symmetric, no curvature., No CVA tenderness EXTREMITIES:no joint deformities, effusion, [name] inflammation, no edema  NEURO: alert & oriented x 3 with fluent speech, no focal motor/sensory deficits  PERFORMANCE STATUS: ECOG 1  LABORATORY DATA: Lab Results  Component Value Date   WBC 11.5 (H)  09/30/2017   HGB 11.3 (L) 09/30/2017   HCT 34.4 (L) 09/30/2017   MCV 90.1 09/30/2017   PLT 125 (L) 09/30/2017      Chemistry      Component Value Date/Time   NA 135 09/30/2017 0338   K 3.6 09/30/2017 0338   CL 101 09/30/2017 0338   CO2 26 09/30/2017 0338   BUN 14 09/30/2017 0338   CREATININE 0.99 09/30/2017 0338   CREATININE 1.33 (H) 09/02/2015 1220      Component Value Date/Time   CALCIUM 8.5 (L) 09/30/2017 0338   ALKPHOS 74 09/30/2017 0338   AST  20 09/30/2017 0338   ALT 20 09/30/2017 0338   BILITOT 0.4 09/30/2017 2248       RADIOGRAPHIC STUDIES: Dg Chest 2 View  Result Date: 10/22/2017 CLINICAL DATA:  67 year old female status post VATS and right upper lobe wedge resection on the right earlier this month. Right lung adenocarcinoma, Stage IA (T1,N0). EXAM: CHEST  2 VIEW COMPARISON:  Postoperative radiographs 10/01/2017 and earlier. FINDINGS: Expected decreased volume in the right hemithorax. The small right pneumothorax has resolved. Curvilinear staple line projects in the upper third of the right lung with mild adjacent hazy opacity. No right pleural effusion identified. Mediastinal contours remain normal. Visualized tracheal air column is within normal limits. Stable left lung. Stable visualized osseous structures, including left shoulder arthroplasty and previous lower thoracic [name] upper lumbar vertebral augmentation. Negative visible bowel gas pattern. IMPRESSION: Satisfactory postoperative appearance of the right chest. No acute cardiopulmonary abnormality. Electronically Signed   By: Genevie Ann M.D.   On: 10/22/2017 14:47    ASSESSMENT: This is a very pleasant 67 years old white female recently diagnosed with a stage Ia (T1a, N0, M0) non-small cell lung cancer, well-differentiated adenocarcinoma status post wedge resection of the right upper lobe under the care of Dr. Roxan [name] on September 28, 2017.  PLAN: I had a lengthy discussion with the patient and her daughter today  about her current disease of stage, prognosis and treatment options. I explained to the patient that the average 5-year survival for stage IA is around 80%.  Also explained to the patient that there is no survival benefit for adjuvant systemic chemotherapy for patient with stage IA right now. I discussed with the patient her treatment options and recommended for her to continue on observation with repeat CT scan of the chest in 6 months. She will come back for follow-up visit at that time. The patient was seen at the multidisciplinary thoracic oncology clinic today by medical oncology, thoracic navigator and physical therapist. She was advised to call immediately if she has any concerning symptoms in the interval. The patient voices understanding of current disease status and treatment options and is in agreement with the current care plan.  All questions were answered. The patient knows to call the clinic with any problems, questions [name] concerns. We can certainly see the patient much sooner if necessary.  Thank you so much for allowing me to participate in the care of [name]. I will continue to follow up the patient with you and assist in her care.  I spent 40 minutes counseling the patient face to face. The total time spent in the appointment was 60 minutes.  Disclaimer: This note was dictated with voice recognition software. Similar sounding words can inadvertently be transcribed and may not be corrected upon review.   Eilleen Kempf November 01, 2017, 2:53 PM

## 2017-11-02 ENCOUNTER — Encounter: Payer: Self-pay | Admitting: *Deleted

## 2017-11-02 NOTE — Progress Notes (Signed)
Oncology Nurse Navigator Documentation  Oncology Nurse Navigator Flowsheets 11/02/2017  Navigator Location [facility]  Navigator Encounter Type Clinic/MDC/I spoke with patient and daughter yesterday at thoracic clinic.  I gave and explained information on lung cancer and follow up. I explained resources at [facility].  Patient's treatment plan is set for 6 month follow up with Dr. Julien Nordmann (05/01/18)  Abnormal Finding Date 07/13/2017  Surgery Date 09/28/2017  Multidisiplinary Clinic Date 11/01/2017  Treatment Initiated Date 10/12/2017  Patient Visit Type MedOnc  Treatment Phase Other  Barriers/Navigation Needs Education  Education Newly Diagnosed Cancer Education  Interventions Education  Education Method Verbal;Written  Acuity Level 2  Acuity Level 2 Educational needs  Time Spent with Patient 30

## 2017-11-05 ENCOUNTER — Other Ambulatory Visit: Payer: Self-pay | Admitting: Family Medicine

## 2017-11-05 NOTE — Telephone Encounter (Signed)
It looks like she needs a visit.

## 2017-11-14 ENCOUNTER — Telehealth: Payer: Self-pay

## 2017-11-14 DIAGNOSIS — N760 Acute vaginitis: Secondary | ICD-10-CM | POA: Diagnosis not present

## 2017-11-14 DIAGNOSIS — Z6827 Body mass index (BMI) 27.0-27.9, adult: Secondary | ICD-10-CM | POA: Diagnosis not present

## 2017-11-14 DIAGNOSIS — Z1231 Encounter for screening mammogram for malignant neoplasm of breast: Secondary | ICD-10-CM | POA: Diagnosis not present

## 2017-11-14 DIAGNOSIS — Z779 Other contact with and (suspected) exposures hazardous to health: Secondary | ICD-10-CM | POA: Diagnosis not present

## 2017-11-14 NOTE — Telephone Encounter (Signed)
Called pt to schedule AWV. Pt declined to schedule at this time.    Josepha Pigg, B.A.  Care Guide - Primary [facility] at [facility]

## 2017-11-15 ENCOUNTER — Ambulatory Visit (INDEPENDENT_AMBULATORY_CARE_PROVIDER_SITE_OTHER): Payer: Medicare Other | Admitting: Family Medicine

## 2017-11-15 ENCOUNTER — Encounter: Payer: Self-pay | Admitting: Family Medicine

## 2017-11-15 ENCOUNTER — Other Ambulatory Visit: Payer: Self-pay

## 2017-11-15 VITALS — BP 128/83 | HR 100 | Temp 98.3°F | Resp 17 | Ht 66.0 in | Wt 162.4 lb

## 2017-11-15 DIAGNOSIS — N189 Chronic kidney disease, unspecified: Secondary | ICD-10-CM | POA: Diagnosis not present

## 2017-11-15 DIAGNOSIS — N289 Disorder of kidney and ureter, unspecified: Secondary | ICD-10-CM

## 2017-11-15 DIAGNOSIS — E7801 Familial hypercholesterolemia: Secondary | ICD-10-CM | POA: Diagnosis not present

## 2017-11-15 DIAGNOSIS — F411 Generalized anxiety disorder: Secondary | ICD-10-CM

## 2017-11-15 DIAGNOSIS — E0580 Other thyrotoxicosis without thyrotoxic crisis or storm: Secondary | ICD-10-CM

## 2017-11-15 NOTE — Progress Notes (Signed)
Chief Complaint  Patient presents with  . Hyperlipidemia    recheck    HPI  Hyperthyroidism iatrogenic Pt is compliant with her thyroid medication She denies any symptoms of hyperthyroid - no anxiety, tremors, nausea, vomiting, diarrhea, sweat or palpitations She skips the doses on the weekends as instructed  Familial Hypercholesterolemia Pt reports that she has lost 25 pounds with weight watchers She hopes she can control the cholesterol She takes her atorvastatin daily No chest pains, muscle pain, shortness of breath  Anxiety She takes wellbutrin for anxiety Denies panic attacks Her cancer was resected and she is being monitored by Oncology Doing well with stress and anxiety  Past Medical History:  Diagnosis Date  . Anemia   . Anxiety   . Arthritis    "everywhere" (08/19/2013)  . Bilateral carpal tunnel syndrome   . Chest pain at rest, rule out pericarditis 11/27/2012  . CHF (congestive heart failure) ([place])   . Chronic kidney disease    dr Risa Grill  . Chronic lower back pain   . Complication of anesthesia    "woke up twice during knee replacement" (08/19/2013)  . Depression   . Dyspnea    w/ exertion    . Fibromyalgia   . Headache(784.0)    hx of migraines   . Heart murmur   . History of pericarditis, with pericardial window in 2009 11/27/2012  . Hx of melanoma of skin 11/27/2012  . Hx of ovarian cancer 11/27/2012  . Hypothyroidism 11/27/2012  . Iron deficiency anemia   . Kidney carcinoma ([place])    "both kidneys; ~ 3 yr apart" (08/19/2013)  . Melanoma of back ([place])   . Migraines   . Pneumonia    "several times; including today", RUL/notes 08/19/2013  . SOB (shortness of breath)    occasional with exertion   . Squamous carcinoma    "face, side of my nose, chest; used acid to get rid of them" (08/19/2013)  . Venous insufficiency 11/27/2012    Current Outpatient Medications  Medication Sig Dispense Refill  . ALPRAZolam (XANAX) 0.5 MG tablet Take 0.5 mg by mouth 2  (two) times daily as needed for anxiety.    Marland Kitchen atorvastatin (LIPITOR) 10 MG tablet Take one tablet by mouth every OTHER day 45 tablet 1  . buPROPion (WELLBUTRIN SR) 150 MG 12 hr tablet Take 1 tablet (150 mg total) by mouth daily. 30 tablet 11  . cyclobenzaprine (FLEXERIL) 5 MG tablet Take 1 tablet (5 mg total) by mouth 3 (three) times daily as needed. For spasms 30 tablet 0  . diclofenac sodium (VOLTAREN) 1 % GEL Apply 1 application topically 3 (three) times daily as needed (pain).    Marland Kitchen levothyroxine (SYNTHROID, LEVOTHROID) 100 MCG tablet TAKE 1 TABLET BY MOUTH BEFORE BREAKFAST MONDAY THRU FRIDAY SKIP SATURDAY AND SUNDAY 30 tablet 3  . morphine (MS CONTIN) 30 MG 12 hr tablet Take 30 mg by mouth every 8 (eight) hours as needed for pain.     . Multiple Vitamins-Minerals (HAIR/SKIN/NAILS) TABS Take 1 tablet by mouth daily.    . naloxone (NARCAN) nasal spray 4 mg/0.1 mL Place into the nose.    . polyethylene glycol (MIRALAX / GLYCOLAX) packet Take 17 g by mouth 2 (two) times daily. (Patient taking differently: Take 17 g by mouth daily as needed for moderate constipation. ) 14 each 0  . PROAIR HFA 108 (90 BASE) MCG/ACT inhaler INHALE 2 PUFFS EVERY 4 HOURS AS NEEDED FOR WHEEZING, COUGH OR SHORTNESS OF BREATH 8.5  g 3  . triamterene-hydrochlorothiazide (MAXZIDE) 75-50 MG tablet Take 1 tablet by mouth daily.    . vitamin B-12 (CYANOCOBALAMIN) 1000 MCG tablet Take 1,000 mcg by mouth daily.    Marland Kitchen zolpidem (AMBIEN) 5 MG tablet TAKE 1 TABLET BY MOUTH AT BEDTIME AS NEEDED FOR SLEEP. 30 tablet 2  . traMADol (ULTRAM) 50 MG tablet Take 1-2 tablets (50-100 mg total) by mouth every 6 (six) hours as needed (mild pain). (Patient not taking: Reported on 11/01/2017) 30 tablet 0   No current facility-administered medications for this visit.     Allergies:  Allergies  Allergen Reactions  . Amoxicillin     UNSPECIFIED REACTION  Has patient had a PCN reaction causing immediate rash, facial/tongue/throat swelling, SOB or  lightheadedness with hypotension: No Has patient had a PCN reaction causing severe rash involving mucus membranes or skin necrosis: No Has patient had a PCN reaction that required hospitalization No Has patient had a PCN reaction occurring within the last 10 years: No If all of the above answers are "NO", then may proceed with Cephalosporin use.  . Ampicillin Rash    Has patient had a PCN reaction causing immediate rash, facial/tongue/throat swelling, SOB or lightheadedness with hypotension: No Has patient had a PCN reaction causing severe rash involving mucus membranes or skin necrosis: No Has patient had a PCN reaction that required hospitalization No Has patient had a PCN reaction occurring within the last 10 years: No If all of the above answers are "NO", then may proceed with Cephalosporin use.    Past Surgical History:  Procedure Laterality Date  . ABDOMINAL HYSTERECTOMY  1979  . APPENDECTOMY    . DOPPLER ECHOCARDIOGRAPHY  02/23/2011   LVEF>50%  . FRACTURE SURGERY    . JOINT REPLACEMENT     left hip as child  x2  . KNEE ARTHROSCOPY Right    "twice before the replacement" (08/19/2013)  . LEFT OOPHORECTOMY Left 1979  . [place]   "ruptured disc"  . MELANOMA EXCISION  ~ 2010   "my lower back" (08/19/2013)  . PARTIAL NEPHRECTOMY Right 2005; ~ 2008  . PERICARDIAL WINDOW  ~ 2012  . POSTERIOR LUMBAR FUSION  2002?; 03/2012  . REDUCTION MAMMAPLASTY Bilateral ~ 2008  . RIGHT OOPHORECTOMY  ~ 2007   "it had cancer in it" (08/19/2013)  . SHOULDER ARTHROSCOPY W/ ROTATOR CUFF REPAIR Right 1990's  . THORACOSCOPY Right 09/28/2017   VIDEO ASSISTED  . TONSILLECTOMY  1954  . TOTAL KNEE ARTHROPLASTY Right 1990's  . TOTAL KNEE ARTHROPLASTY Left 08/02/2015   Procedure: LEFT TOTAL KNEE ARTHROPLASTY;  Surgeon: Paralee Cancel, MD;  Location: [facility];  Service: Orthopedics;  Laterality: Left;  . TOTAL SHOULDER REPLACEMENT Left 2006?  . TUBAL LIGATION  1952  . VIDEO ASSISTED  THORACOSCOPY (VATS)/WEDGE RESECTION Right 09/28/2017   Procedure: RIGHT VIDEO ASSISTED THORACOSCOPY (VATS)/WEDGE RESECTION, LYMPH NODE DISSECTION ;  Surgeon: Melrose Nakayama, MD;  Location: [facility];  Service: Thoracic;  Laterality: Right;    Social History   Socioeconomic History  . Marital status: Married    Spouse name: None  . Number of children: None  . Years of education: None  . Highest education level: None  Social Needs  . Financial resource strain: None  . Food insecurity - worry: None  . Food insecurity - inability: None  . Transportation needs - medical: None  . Transportation needs - non-medical: None  Occupational History  . None  Tobacco Use  . Smoking  status: Former Smoker    Packs/day: 1.50    Years: 18.00    Pack years: 27.00    Types: Cigarettes    Last attempt to quit: 09/22/1985    Years since quitting: 32.1  . Smokeless tobacco: Never Used  Substance and Sexual Activity  . Alcohol use: No  . Drug use: No    Comment: prescribed  . Sexual activity: No  Other Topics Concern  . None  Social History Narrative   Married.    Family History  Problem Relation Age of Onset  . Cancer Mother   . Cancer Father   . Bipolar disorder Sister      ROS Review of Systems See HPI Constitution: No fevers or chills No malaise No diaphoresis Skin: No rash or itching Eyes: no blurry vision, no double vision GU: no dysuria or hematuria Neuro: no dizziness or headaches all others reviewed and negative   Objective: Vitals:   11/15/17 1349  BP: 128/83  Pulse: 100  Resp: 17  Temp: 98.3 F (36.8 C)  TempSrc: Oral  SpO2: 99%  Weight: 162 lb 6.4 oz (73.7 kg)  Height: 5\' 6"  (1.676 m)   Wt Readings from Last 3 Encounters:  11/15/17 162 lb 6.4 oz (73.7 kg)  11/01/17 162 lb 14.4 oz (73.9 kg)  10/22/17 161 lb (73 kg)  12/27/2016 185lbs   Physical Exam  Constitutional: She is oriented to person, place, and time. She appears well-developed and  well-nourished.  HENT:  Head: Normocephalic and atraumatic.  Eyes: Conjunctivae and EOM are normal.  Neck: Normal range of motion. No thyromegaly present.  Cardiovascular: Normal rate, regular rhythm and normal heart sounds.  No murmur heard. Pulmonary/Chest: Effort normal and breath sounds normal. No stridor. No respiratory distress. She has no wheezes.  Neurological: She is alert and oriented to person, place, and time.  Skin: Skin is warm. Capillary refill takes less than 2 seconds.  Psychiatric: She has a normal mood and affect. Her behavior is normal. Judgment and thought content normal.   Lab Results  Component Value Date   TSH 0.541 11/15/2017    Assessment and Plan Emberlee was seen today for hyperlipidemia.  Diagnoses and all orders for this visit:  Familial hypercholesteremia- improved on atorvastatin -     TSH + free T4 -     Comprehensive metabolic panel -     Lipid panel  Iatrogenic hyperthyroidism- improved with skipping weekends -     TSH + free T4 -     Comprehensive metabolic panel -     Lipid panel  Generalized anxiety disorder- doing well on wellbutrin  Acute on chronic renal insufficiency- pt with chronic opiate use with worsening renal function Review of care everywhere did not show any previous creatinines In the system patient has previous elevated Creatinine as high as 1.75 but normalized and has now worsened  Other orders -     Cancel: MM Digital Screening; Future -     Cancel: Hemoglobin A1c -     atorvastatin (LIPITOR) 10 MG tablet; Take one tablet by mouth every OTHER day -     buPROPion (WELLBUTRIN SR) 150 MG 12 hr tablet; Take 1 tablet (150 mg total) by mouth daily. -     levothyroxine (SYNTHROID, LEVOTHROID) 100 MCG tablet; TAKE 1 TABLET BY MOUTH BEFORE BREAKFAST MONDAY THRU FRIDAY SKIP SATURDAY AND SUNDAY     Damiano Stamper A [place] Beem

## 2017-11-15 NOTE — Patient Instructions (Addendum)
IF you received an x-ray today, you will receive an invoice from [facility]. Please contact [facility] at [phone] with questions or concerns regarding your invoice.   IF you received labwork today, you will receive an invoice from [place]. Please contact [facility] at [phone] with questions or concerns regarding your invoice.   Our billing staff will not be able to assist you with questions regarding bills from these companies.  You will be contacted with the lab results as soon as they are available. The fastest way to get your results is to activate your My Chart account. Instructions are located on the last page of this paperwork. If you have not heard from [place] regarding the results in 2 weeks, please contact this office.    Thyroid-Stimulating Hormone Test Why am I having this test? A thyroid-stimulating hormone (TSH) test is a blood test that is done to measure the level of TSH, also known as thyrotropin, in your blood. TSH is produced by the pituitary gland. The pituitary gland is a small organ located just below the brain, behind your eyes and nasal passages. It is part of a system that monitors and maintains thyroid hormone levels and thyroid gland function. Thyroid hormones affect many body parts and systems, including the system that affects how quickly your body burns fuel for energy. Your health care provider may recommend testing your TSH level if you have signs and symptoms of abnormal thyroid hormone levels. Knowing the level of TSH in your blood can help your health care provider:  Diagnose a thyroid gland or pituitary gland disorder.  Manage your condition and treatment if you have hypothyroidism or hyperthyroidism.  What kind of sample is taken? A blood sample is required for this test. It is usually collected by inserting a needle into a vein. How do I prepare for this test? There is no preparation required for this test. What are the  reference ranges? Reference rangesare considered healthy rangesestablished after testing a large group of healthy people. Reference rangesmay vary among different people, labs, and hospitals. It is your responsibility to obtain your test results. Ask the lab or department performing the test when and how you will get your results. Range of Normal Values:  Adult: 0.3-5 microunits/mL or 0.3-5 milliunits/L (SI units).  Newborn: 53-18 microunits/mL or 3-18 milliunits/L.  Cord: 3-12 microunits/mL or 3-12 milliunits/L.  What do the results mean? A high level of TSH may mean:  Your thyroid gland is not making enough thyroid hormones. When the thyroid gland does not make enough thyroid hormones, the pituitary gland releases TSH into the bloodstream. The higher-than-normal levels of TSH prompt the thyroid gland to release more thyroid hormones.  You are getting an insufficient level of thyroid hormone medicine, if you are receiving this type of treatment.  There is a problem with the pituitary gland (rare).  A low level of TSH can indicate a problem with the pituitary gland. Talk with your health care provider to discuss your results, treatment options, and if necessary, the need for more tests. Talk with your health care provider if you have any questions about your results. Talk with your health care provider to discuss your results, treatment options, and if necessary, the need for more tests. Talk with your health care provider if you have any questions about your results. This information is not intended to replace advice given to you by your health care provider. Make sure you discuss any questions you have with your  health care provider. Document Released: 11/03/2004 Document Revised: 06/11/2016 Document Reviewed: 03/04/2014 Elsevier Interactive Patient Education  [employer].

## 2017-11-16 LAB — COMPREHENSIVE METABOLIC PANEL
ALT: 12 IU/L (ref 0–32)
AST: 18 IU/L (ref 0–40)
Albumin/Globulin Ratio: 1.6 (ref 1.2–2.2)
Albumin: 4.3 g/dL (ref 3.6–4.8)
Alkaline Phosphatase: 107 IU/L (ref 39–117)
BUN/Creatinine Ratio: 22 (ref 12–28)
BUN: 34 mg/dL — ABNORMAL HIGH (ref 8–27)
Bilirubin Total: 0.3 mg/dL (ref 0.0–1.2)
CO2: 26 mmol/L (ref 20–29)
Calcium: 9.6 mg/dL (ref 8.7–10.3)
Chloride: 100 mmol/L (ref 96–106)
Creatinine, Ser: 1.54 mg/dL — ABNORMAL HIGH (ref 0.57–1.00)
GFR calc Af Amer: 40 mL/min/{1.73_m2} — ABNORMAL LOW (ref >59)
GFR calc non Af Amer: 34 mL/min/{1.73_m2} — ABNORMAL LOW (ref >59)
Globulin, Total: 2.7 g/dL (ref 1.5–4.5)
Glucose: 105 mg/dL — ABNORMAL HIGH (ref 65–99)
Potassium: 4 mmol/L (ref 3.5–5.2)
Sodium: 143 mmol/L (ref 134–144)
Total Protein: 7 g/dL (ref 6.0–8.5)

## 2017-11-16 LAB — LIPID PANEL
Chol/HDL Ratio: 2.9 ratio (ref 0.0–4.4)
Cholesterol, Total: 205 mg/dL — ABNORMAL HIGH (ref 100–199)
HDL: 71 mg/dL (ref >39)
LDL Calculated: 117 mg/dL — ABNORMAL HIGH (ref 0–99)
Triglycerides: 85 mg/dL (ref 0–149)
VLDL Cholesterol Cal: 17 mg/dL (ref 5–40)

## 2017-11-16 LAB — TSH+FREE T4
Free T4: 1.48 ng/dL (ref 0.82–1.77)
TSH: 0.541 u[IU]/mL (ref 0.450–4.500)

## 2017-11-18 MED ORDER — BUPROPION HCL ER (SR) 150 MG PO TB12: 150.0000 mg | ORAL_TABLET | Freq: Every day | ORAL | 11 refills | Status: DC

## 2017-11-18 MED ORDER — LEVOTHYROXINE SODIUM 100 MCG PO TABS: ORAL_TABLET | ORAL | 3 refills | Status: DC

## 2017-11-18 MED ORDER — ATORVASTATIN CALCIUM 10 MG PO TABS: ORAL_TABLET | ORAL | 1 refills | Status: DC

## 2017-11-22 DIAGNOSIS — D044 Carcinoma in situ of skin of scalp and neck: Secondary | ICD-10-CM | POA: Diagnosis not present

## 2017-11-22 DIAGNOSIS — D485 Neoplasm of uncertain behavior of skin: Secondary | ICD-10-CM | POA: Diagnosis not present

## 2017-12-21 DIAGNOSIS — M5136 Other intervertebral disc degeneration, lumbar region: Secondary | ICD-10-CM | POA: Diagnosis not present

## 2017-12-21 DIAGNOSIS — M48061 Spinal stenosis, lumbar region without neurogenic claudication: Secondary | ICD-10-CM | POA: Diagnosis not present

## 2017-12-21 DIAGNOSIS — M4726 Other spondylosis with radiculopathy, lumbar region: Secondary | ICD-10-CM | POA: Diagnosis not present

## 2017-12-21 DIAGNOSIS — M5416 Radiculopathy, lumbar region: Secondary | ICD-10-CM | POA: Diagnosis not present

## 2017-12-21 DIAGNOSIS — Z981 Arthrodesis status: Secondary | ICD-10-CM | POA: Diagnosis not present

## 2018-01-21 DIAGNOSIS — M170 Bilateral primary osteoarthritis of knee: Secondary | ICD-10-CM | POA: Diagnosis not present

## 2018-01-21 DIAGNOSIS — G5603 Carpal tunnel syndrome, bilateral upper limbs: Secondary | ICD-10-CM | POA: Diagnosis not present

## 2018-01-21 DIAGNOSIS — M67919 Unspecified disorder of synovium and tendon, unspecified shoulder: Secondary | ICD-10-CM | POA: Diagnosis not present

## 2018-01-21 DIAGNOSIS — G894 Chronic pain syndrome: Secondary | ICD-10-CM | POA: Diagnosis not present

## 2018-02-16 ENCOUNTER — Emergency Department (HOSPITAL_COMMUNITY)
Admission: EM | Admit: 2018-02-16 | Discharge: 2018-02-17 | Disposition: A | Discharge status: Home / Self Care | Payer: Medicare Other | Attending: Emergency Medicine | Admitting: Emergency Medicine

## 2018-02-16 ENCOUNTER — Other Ambulatory Visit: Payer: Self-pay

## 2018-02-16 ENCOUNTER — Encounter (HOSPITAL_COMMUNITY): Payer: Self-pay | Admitting: Emergency Medicine

## 2018-02-16 DIAGNOSIS — R52 Pain, unspecified: Secondary | ICD-10-CM | POA: Diagnosis not present

## 2018-02-16 DIAGNOSIS — R252 Cramp and spasm: Secondary | ICD-10-CM | POA: Insufficient documentation

## 2018-02-16 DIAGNOSIS — R5381 Other malaise: Secondary | ICD-10-CM | POA: Insufficient documentation

## 2018-02-16 DIAGNOSIS — R410 Disorientation, unspecified: Secondary | ICD-10-CM | POA: Insufficient documentation

## 2018-02-16 DIAGNOSIS — Z5321 Procedure and treatment not carried out due to patient leaving prior to being seen by health care provider: Secondary | ICD-10-CM | POA: Insufficient documentation

## 2018-02-16 DIAGNOSIS — R682 Dry mouth, unspecified: Secondary | ICD-10-CM | POA: Diagnosis not present

## 2018-02-16 LAB — COMPREHENSIVE METABOLIC PANEL
ALT: 40 U/L (ref 14–54)
AST: 65 U/L — ABNORMAL HIGH (ref 15–41)
Albumin: 3.4 g/dL — ABNORMAL LOW (ref 3.5–5.0)
Alkaline Phosphatase: 83 U/L (ref 38–126)
Anion gap: 11 (ref 5–15)
BUN: 38 mg/dL — ABNORMAL HIGH (ref 6–20)
CO2: 28 mmol/L (ref 22–32)
Calcium: 8.7 mg/dL — ABNORMAL LOW (ref 8.9–10.3)
Chloride: 101 mmol/L (ref 101–111)
Creatinine, Ser: 1.54 mg/dL — ABNORMAL HIGH (ref 0.44–1.00)
GFR calc Af Amer: 39 mL/min — ABNORMAL LOW (ref >60)
GFR calc non Af Amer: 34 mL/min — ABNORMAL LOW (ref >60)
Glucose, Bld: 97 mg/dL (ref 65–99)
Potassium: 2.9 mmol/L — ABNORMAL LOW (ref 3.5–5.1)
Sodium: 140 mmol/L (ref 135–145)
Total Bilirubin: 0.5 mg/dL (ref 0.3–1.2)
Total Protein: 6.4 g/dL — ABNORMAL LOW (ref 6.5–8.1)

## 2018-02-16 LAB — URINALYSIS, ROUTINE W REFLEX MICROSCOPIC
Bilirubin Urine: NEGATIVE
Glucose, UA: NEGATIVE mg/dL
Hgb urine dipstick: NEGATIVE
Ketones, ur: NEGATIVE mg/dL
Nitrite: NEGATIVE
Protein, ur: NEGATIVE mg/dL
Specific Gravity, Urine: 1.016 (ref 1.005–1.030)
WBC, UA: >50 WBC/hpf — ABNORMAL HIGH (ref 0–5)
pH: 5 (ref 5.0–8.0)

## 2018-02-16 LAB — CBC
HCT: 32.3 % — ABNORMAL LOW (ref 36.0–46.0)
Hemoglobin: 10.4 g/dL — ABNORMAL LOW (ref 12.0–15.0)
MCH: 28.4 pg (ref 26.0–34.0)
MCHC: 32.2 g/dL (ref 30.0–36.0)
MCV: 88.3 fL (ref 78.0–100.0)
Platelets: 183 10*3/uL (ref 150–400)
RBC: 3.66 MIL/uL — ABNORMAL LOW (ref 3.87–5.11)
RDW: 14.2 % (ref 11.5–15.5)
WBC: 6.5 10*3/uL (ref 4.0–10.5)

## 2018-02-16 NOTE — ED Triage Notes (Signed)
Patient presents to ED for assessment after cutting out Xanax cold [place] a few weeks ago, then returning to taking them, and then 2 weeks ago going cold [place] again.  Patient c/o dry mouth, intermittent confusion, bilateral lower limb cramping, sweats, generalized body aches, and general malaise.  States she could not sleep tonight due to the cramping, and just felt like coming in to get checked out.

## 2018-02-17 NOTE — ED Notes (Signed)
Pt states that she is leaving due to wait times  

## 2018-02-20 ENCOUNTER — Other Ambulatory Visit: Payer: Self-pay

## 2018-02-20 ENCOUNTER — Ambulatory Visit (INDEPENDENT_AMBULATORY_CARE_PROVIDER_SITE_OTHER): Payer: Medicare Other | Admitting: Family Medicine

## 2018-02-20 ENCOUNTER — Encounter: Payer: Self-pay | Admitting: Family Medicine

## 2018-02-20 VITALS — BP 130/68 | HR 97 | Temp 98.4°F | Resp 16 | Ht 66.0 in | Wt 157.0 lb

## 2018-02-20 DIAGNOSIS — R413 Other amnesia: Secondary | ICD-10-CM

## 2018-02-20 DIAGNOSIS — R41841 Cognitive communication deficit: Secondary | ICD-10-CM

## 2018-02-20 DIAGNOSIS — F411 Generalized anxiety disorder: Secondary | ICD-10-CM | POA: Diagnosis not present

## 2018-02-20 DIAGNOSIS — F13230 Sedative, hypnotic or anxiolytic dependence with withdrawal, uncomplicated: Secondary | ICD-10-CM | POA: Diagnosis not present

## 2018-02-20 DIAGNOSIS — R4189 Other symptoms and signs involving cognitive functions and awareness: Secondary | ICD-10-CM

## 2018-02-20 DIAGNOSIS — F13930 Sedative, hypnotic or anxiolytic use, unspecified with withdrawal, uncomplicated: Secondary | ICD-10-CM

## 2018-02-20 DIAGNOSIS — E876 Hypokalemia: Secondary | ICD-10-CM

## 2018-02-20 MED ORDER — BUPROPION HCL ER (SR) 150 MG PO TB12: 150.0000 mg | ORAL_TABLET | Freq: Two times a day (BID) | ORAL | 4 refills | Status: DC

## 2018-02-20 MED ORDER — BUSPIRONE HCL 5 MG PO TABS: 5.0000 mg | ORAL_TABLET | Freq: Three times a day (TID) | ORAL | 0 refills | Status: DC

## 2018-02-20 NOTE — Patient Instructions (Addendum)
[facility] [place], [place] [address]  [email or web address]  Phone: [phone]     IF you received an x-ray today, you will receive an invoice from [facility]. Please contact [facility] at [phone] with questions or concerns regarding your invoice.   IF you received labwork today, you will receive an invoice from [place]. Please contact [facility] at [phone] with questions or concerns regarding your invoice.   Our billing staff will not be able to assist you with questions regarding bills from these companies.  You will be contacted with the lab results as soon as they are available. The fastest way to get your results is to activate your My Chart account. Instructions are located on the last page of this paperwork. If you have not heard from [place] regarding the results in 2 weeks, please contact this office.      Benzodiazepine Withdrawal Benzodiazepines are prescription medicines that decrease the activity of (depress) the central nervous system and cause changes in certain brain chemicals (neurotransmitters). Withdrawal is a group of physical and mental symptoms that can happen when you suddenly stop taking a medicine. There are many types of benzodiazepines. Some benzodiazepines take effect quickly and stay in your system for a short amount of time (short-acting). Other benzodiazepines require more time to take effect and stay in your system for longer amounts of time (long-acting). The five most commonly prescribed benzodiazepines are:  Alprazolam.  Lorazepam.  Clonazepam.  Diazepam.  Temazepam.  What are the causes? When you take benzodiazepines, your brain needs more and more of the medicine over time in order to get the same effects from it. This increased need is called tolerance. As you develop a tolerance, your brain adapts to the effects of the benzodiazepine and relies on these effects. This is called dependency.  Withdrawal happens when you suddenly stop taking your medicine. This does not give your brain enough time to adapt to not having the medicine. What increases the risk? This condition is more likely to develop in:  People who have taken benzodiazepines for more than 1-2 weeks.  People who have developed a tolerance for benzodiazepines.  People who have developed a dependence on benzodiazepines.  People who take high dosages of benzodiazepines.  People who take doses of benzodiazepines that are higher than prescribed.  People who take benzodiazepines without a prescription.  People who use benzodiazepines with other substances that depress the central nervous system, such as alcohol.  People who have a history of drug or alcohol abuse.  What are the signs or symptoms? Symptoms of this condition may include:  Difficulty sleeping.  Anxiety.  Restlessness.  Irritability.  Muscle aches.  Involuntary shaking or trembling of a body part (tremor).  Confusion and poor concentration.  Vomiting.  Sweating.  Headaches.  Feeling or seeing things that are not there (hallucinations).  Seizures.  Symptoms of withdrawal from short-acting benzodiazepines may develop 1-2 days after you stop taking your medicine, and they may last for 2-4 weeks or longer. Symptoms of withdrawal from long-acting benzodiazepines may develop 2-7 days after you stop taking your medicine, and they may last for 2-8 weeks or longer. How is this diagnosed? This condition may be diagnosed based on:  Your symptoms.  A physical exam. Your health care provider may check for: ? Rapid heartbeat. ? Rapid breathing. ? Tremors. ? High blood pressure.  Blood tests.  Urine tests.  Your alcohol and drug habits.  Your medical history.  How is this  treated? Treatment for this condition depends on:  Your symptoms.  The type of benzodiazepine you have been taking.  How long you have been taking  benzodiazepines.  Treatment usually involves starting you on a safe and stable dose of a benzodiazepine and then slowly lowering your dosage over time (tapered withdrawal). This may be done at a hospital or a treatment center. Long-term treatment for this condition may involve medicine, counseling, and support groups. Follow these instructions at home:  Take over-the-counter and prescription medicines only as told by your health care provider.  Check with your health care provider before starting new medicines.  Keep all follow-up visits as told by your health care provider. This is important. How is this prevented?  Do not take any benzodiazepines without a prescription.  Do not take more than your prescribed dosage.  Do not mix benzodiazepines with alcohol or other medicines.  Do not stop taking benzodiazepines without speaking with your health care provider. Contact a health care provider if:  You are not able to take your medicines as told by your health care provider.  You have symptoms that get worse.  You develop withdrawal symptoms during your tapered withdrawal.  You develop a craving for drugs or alcohol.  You experience withdrawal again (relapse). Get help right away if:  You have a seizure.  You become very confused.  You lose consciousness.  You have difficulty breathing.  You have serious thoughts about hurting yourself or someone else. This information is not intended to replace advice given to you by your health care provider. Make sure you discuss any questions you have with your health care provider. Document Released: 09/28/2011 Document Revised: 02/17/2016 Document Reviewed: 03/31/2015 Elsevier Interactive Patient Education  [employer].

## 2018-02-20 NOTE — Progress Notes (Signed)
Chief Complaint  Patient presents with  . Memory Loss    HPI  Patient was stopped off Xanax and was going off the the xanax She was given 21 xanax tablets without a taper She started a taper  She went to the hospital for benzo withdrawal Tongue burning, delirium, cramps, feeling of dread, afraid to move about, poor appeitite, nervousness, shakiness Difficulty focusing on her thoughts, dry mouth In the ER she was seen and had vitals done and since she had stable vitals she went She reports that she was without the benzodiazepines for a week  Her daughter states that "she lives in a very volatile home with no compassion from her husband so suffers from her anxiety".  Her last xanax was 2 weeks ago.     Past Medical History:  Diagnosis Date  . Anemia   . Anxiety   . Arthritis    "everywhere" (08/19/2013)  . Bilateral carpal tunnel syndrome   . Chest pain at rest, rule out pericarditis 11/27/2012  . CHF (congestive heart failure) ([place])   . Chronic kidney disease    dr Risa Grill  . Chronic lower back pain   . Complication of anesthesia    "woke up twice during knee replacement" (08/19/2013)  . Depression   . Dyspnea    w/ exertion    . Fibromyalgia   . Headache(784.0)    hx of migraines   . Heart murmur   . History of pericarditis, with pericardial window in 2009 11/27/2012  . Hx of melanoma of skin 11/27/2012  . Hx of ovarian cancer 11/27/2012  . Hypothyroidism 11/27/2012  . Iron deficiency anemia   . Kidney carcinoma ([place])    "both kidneys; ~ 3 yr apart" (08/19/2013)  . Melanoma of back ([place])   . Migraines   . Pneumonia    "several times; including today", RUL/notes 08/19/2013  . SOB (shortness of breath)    occasional with exertion   . Squamous carcinoma    "face, side of my nose, chest; used acid to get rid of them" (08/19/2013)  . Venous insufficiency 11/27/2012    Current Outpatient Medications  Medication Sig Dispense Refill  . ALPRAZolam (XANAX) 0.5 MG tablet Take  0.5 mg by mouth 2 (two) times daily as needed for anxiety.    Marland Kitchen atorvastatin (LIPITOR) 10 MG tablet Take one tablet by mouth every OTHER day 45 tablet 1  . buPROPion (WELLBUTRIN SR) 150 MG 12 hr tablet Take 1 tablet (150 mg total) by mouth 2 (two) times daily. 60 tablet 4  . cyclobenzaprine (FLEXERIL) 5 MG tablet Take 1 tablet (5 mg total) by mouth 3 (three) times daily as needed. For spasms 30 tablet 0  . diclofenac sodium (VOLTAREN) 1 % GEL Apply 1 application topically 3 (three) times daily as needed (pain).    Marland Kitchen levothyroxine (SYNTHROID, LEVOTHROID) 100 MCG tablet TAKE 1 TABLET BY MOUTH BEFORE BREAKFAST MONDAY THRU FRIDAY SKIP SATURDAY AND SUNDAY 30 tablet 3  . morphine (MS CONTIN) 30 MG 12 hr tablet Take 30 mg by mouth every 8 (eight) hours as needed for pain.     . Multiple Vitamins-Minerals (HAIR/SKIN/NAILS) TABS Take 1 tablet by mouth daily.    . polyethylene glycol (MIRALAX / GLYCOLAX) packet Take 17 g by mouth 2 (two) times daily. (Patient taking differently: Take 17 g by mouth daily as needed for moderate constipation. ) 14 each 0  . PROAIR HFA 108 (90 BASE) MCG/ACT inhaler INHALE 2 PUFFS EVERY 4 HOURS AS  NEEDED FOR WHEEZING, COUGH OR SHORTNESS OF BREATH 8.5 g 3  . traMADol (ULTRAM) 50 MG tablet Take 1-2 tablets (50-100 mg total) by mouth every 6 (six) hours as needed (mild pain). (Patient not taking: Reported on 11/01/2017) 30 tablet 0  . triamterene-hydrochlorothiazide (MAXZIDE) 75-50 MG tablet Take 1 tablet by mouth daily.    . vitamin B-12 (CYANOCOBALAMIN) 1000 MCG tablet Take 1,000 mcg by mouth daily.    Marland Kitchen zolpidem (AMBIEN) 5 MG tablet TAKE 1 TABLET BY MOUTH AT BEDTIME AS NEEDED FOR SLEEP. 30 tablet 2   No current facility-administered medications for this visit.     Allergies:  Allergies  Allergen Reactions  . Amoxicillin     UNSPECIFIED REACTION  Has patient had a PCN reaction causing immediate rash, facial/tongue/throat swelling, SOB or lightheadedness with hypotension:  No Has patient had a PCN reaction causing severe rash involving mucus membranes or skin necrosis: No Has patient had a PCN reaction that required hospitalization No Has patient had a PCN reaction occurring within the last 10 years: No If all of the above answers are "NO", then may proceed with Cephalosporin use.  . Ampicillin Rash    Has patient had a PCN reaction causing immediate rash, facial/tongue/throat swelling, SOB or lightheadedness with hypotension: No Has patient had a PCN reaction causing severe rash involving mucus membranes or skin necrosis: No Has patient had a PCN reaction that required hospitalization No Has patient had a PCN reaction occurring within the last 10 years: No If all of the above answers are "NO", then may proceed with Cephalosporin use.    Past Surgical History:  Procedure Laterality Date  . ABDOMINAL HYSTERECTOMY  1979  . APPENDECTOMY    . DOPPLER ECHOCARDIOGRAPHY  02/23/2011   LVEF>50%  . FRACTURE SURGERY    . JOINT REPLACEMENT     left hip as child  x2  . KNEE ARTHROSCOPY Right    "twice before the replacement" (08/19/2013)  . LEFT OOPHORECTOMY Left 1979  . Pontoosuc   "ruptured disc"  . MELANOMA EXCISION  ~ 2010   "my lower back" (08/19/2013)  . PARTIAL NEPHRECTOMY Right 2005; ~ 2008  . PERICARDIAL WINDOW  ~ 2012  . POSTERIOR LUMBAR FUSION  2002?; 03/2012  . REDUCTION MAMMAPLASTY Bilateral ~ 2008  . RIGHT OOPHORECTOMY  ~ 2007   "it had cancer in it" (08/19/2013)  . SHOULDER ARTHROSCOPY W/ ROTATOR CUFF REPAIR Right 1990's  . THORACOSCOPY Right 09/28/2017   VIDEO ASSISTED  . TONSILLECTOMY  1954  . TOTAL KNEE ARTHROPLASTY Right 1990's  . TOTAL KNEE ARTHROPLASTY Left 08/02/2015   Procedure: LEFT TOTAL KNEE ARTHROPLASTY;  Surgeon: Paralee Cancel, MD;  Location: [facility];  Service: Orthopedics;  Laterality: Left;  . TOTAL SHOULDER REPLACEMENT Left 2006?  . TUBAL LIGATION  1952  . VIDEO ASSISTED THORACOSCOPY (VATS)/WEDGE RESECTION Right  09/28/2017   Procedure: RIGHT VIDEO ASSISTED THORACOSCOPY (VATS)/WEDGE RESECTION, LYMPH NODE DISSECTION ;  Surgeon: Melrose Nakayama, MD;  Location: [facility];  Service: Thoracic;  Laterality: Right;    Social History   Socioeconomic History  . Marital status: Married    Spouse name: Not on file  . Number of children: Not on file  . Years of education: Not on file  . Highest education level: Not on file  Occupational History  . Not on file  Social Needs  . Financial resource strain: Not on file  . Food insecurity:    Worry: Not on file  Inability: Not on file  . Transportation needs:    Medical: Not on file    Non-medical: Not on file  Tobacco Use  . Smoking status: Former Smoker    Packs/day: 1.50    Years: 18.00    Pack years: 27.00    Types: Cigarettes    Last attempt to quit: 09/22/1985    Years since quitting: 32.4  . Smokeless tobacco: Never Used  Substance and Sexual Activity  . Alcohol use: No  . Drug use: No    Comment: prescribed  . Sexual activity: Never  Lifestyle  . Physical activity:    Days per week: Not on file    Minutes per session: Not on file  . Stress: Not on file  Relationships  . Social connections:    Talks on phone: Not on file    Gets together: Not on file    Attends religious service: Not on file    Active member of club or organization: Not on file    Attends meetings of clubs or organizations: Not on file    Relationship status: Not on file  Other Topics Concern  . Not on file  Social History Narrative   Married.    Family History  Problem Relation Age of Onset  . Cancer Mother   . Cancer Father   . Bipolar disorder Sister      ROS Review of Systems See HPI Constitution: No fevers or chills No malaise No diaphoresis Skin: No rash or itching Eyes: no blurry vision, no double vision GU: no dysuria or hematuria GI: no nausea or vomiting, no diarrhea all others reviewed and negative   Objective: Vitals:   02/20/18  1322  BP: 130/68  Pulse: 97  Resp: 16  Temp: 98.4 F (36.9 C)  SpO2: 98%  Weight: 157 lb (71.2 kg)  Height: 5\' 6"  (1.676 m)    Physical Exam  Constitutional: She is oriented to person, place, and time. She appears well-developed and well-nourished.  HENT:  Head: Normocephalic and atraumatic.  Right Ear: External ear normal.  Left Ear: External ear normal.  Mouth/Throat: Oropharynx is clear and moist.  Eyes: Conjunctivae and EOM are normal.  Neck: Normal range of motion. Neck supple.  Cardiovascular: Normal rate, regular rhythm, normal heart sounds and intact distal pulses.  No murmur heard. Pulmonary/Chest: Effort normal and breath sounds normal. No stridor. No respiratory distress. She has no wheezes. She has no rales.  Neurological: She is alert and oriented to person, place, and time.  Skin: Skin is warm. Capillary refill takes less than 2 seconds. No rash noted. No erythema. No pallor.     Assessment and Plan Shantay was seen today for memory loss.  Diagnoses and all orders for this visit:  Generalized anxiety disorder-  Would not recommend benzodiazepines Advised treating anxiety with wellbutrin Will check b12 levels -     Vitamin B12  Hypokalemia-  Discussed that his could be due to not eating due to poor apetite -     Basic metabolic panel  Benzodiazepine withdrawal without complication ([place])- advised pt to follow up after trial of buproprion ofr anxiety and depression -     buPROPion (WELLBUTRIN SR) 150 MG 12 hr tablet; Take 1 tablet (150 mg total) by mouth 2 (two) times daily. -     Vitamin B12  Acute cognitive decline- she is well supported by her daughter  She should return to DM  -     Vitamin B12  [place]

## 2018-02-21 LAB — BASIC METABOLIC PANEL
BUN/Creatinine Ratio: 21 (ref 12–28)
BUN: 31 mg/dL — ABNORMAL HIGH (ref 8–27)
CO2: 27 mmol/L (ref 20–29)
Calcium: 9 mg/dL (ref 8.7–10.3)
Chloride: 100 mmol/L (ref 96–106)
Creatinine, Ser: 1.46 mg/dL — ABNORMAL HIGH (ref 0.57–1.00)
GFR calc Af Amer: 42 mL/min/{1.73_m2} — ABNORMAL LOW (ref >59)
GFR calc non Af Amer: 37 mL/min/{1.73_m2} — ABNORMAL LOW (ref >59)
Glucose: 92 mg/dL (ref 65–99)
Potassium: 3.4 mmol/L — ABNORMAL LOW (ref 3.5–5.2)
Sodium: 143 mmol/L (ref 134–144)

## 2018-02-21 LAB — VITAMIN B12: Vitamin B-12: 1787 pg/mL — ABNORMAL HIGH (ref 232–1245)

## 2018-02-23 DIAGNOSIS — M79671 Pain in right foot: Secondary | ICD-10-CM | POA: Diagnosis not present

## 2018-02-23 DIAGNOSIS — M79672 Pain in left foot: Secondary | ICD-10-CM | POA: Diagnosis not present

## 2018-03-07 ENCOUNTER — Ambulatory Visit: Payer: Medicare Other | Admitting: Family Medicine

## 2018-03-13 ENCOUNTER — Encounter: Payer: Self-pay | Admitting: Family Medicine

## 2018-03-13 ENCOUNTER — Other Ambulatory Visit: Payer: Self-pay

## 2018-03-13 ENCOUNTER — Ambulatory Visit (INDEPENDENT_AMBULATORY_CARE_PROVIDER_SITE_OTHER): Payer: Medicare Other | Admitting: Family Medicine

## 2018-03-13 VITALS — BP 118/80 | HR 102 | Temp 97.7°F | Resp 16 | Ht 66.0 in | Wt 149.8 lb

## 2018-03-13 DIAGNOSIS — R4189 Other symptoms and signs involving cognitive functions and awareness: Secondary | ICD-10-CM | POA: Insufficient documentation

## 2018-03-13 DIAGNOSIS — N189 Chronic kidney disease, unspecified: Secondary | ICD-10-CM | POA: Insufficient documentation

## 2018-03-13 DIAGNOSIS — F13230 Sedative, hypnotic or anxiolytic dependence with withdrawal, uncomplicated: Secondary | ICD-10-CM | POA: Diagnosis not present

## 2018-03-13 DIAGNOSIS — E039 Hypothyroidism, unspecified: Secondary | ICD-10-CM

## 2018-03-13 DIAGNOSIS — N289 Disorder of kidney and ureter, unspecified: Secondary | ICD-10-CM | POA: Diagnosis not present

## 2018-03-13 DIAGNOSIS — F13930 Sedative, hypnotic or anxiolytic use, unspecified with withdrawal, uncomplicated: Secondary | ICD-10-CM | POA: Insufficient documentation

## 2018-03-13 DIAGNOSIS — E876 Hypokalemia: Secondary | ICD-10-CM | POA: Diagnosis not present

## 2018-03-13 DIAGNOSIS — R41841 Cognitive communication deficit: Secondary | ICD-10-CM | POA: Diagnosis not present

## 2018-03-13 DIAGNOSIS — F411 Generalized anxiety disorder: Secondary | ICD-10-CM | POA: Insufficient documentation

## 2018-03-13 NOTE — Assessment & Plan Note (Signed)
Pt with wellbutrin for chronic anxiety but now off benzos Pt is also at the cancer center for early lung cancer and currently needs something for her anxiety. Discussed that she should follow up with Psychiatry to help with adjusting her meds and to assess her cognitive decline.

## 2018-03-13 NOTE — Assessment & Plan Note (Signed)
Still concerning for acute delirium over chronic dementia

## 2018-03-13 NOTE — Assessment & Plan Note (Signed)
Poor PO intake. Improving over the past month.

## 2018-03-13 NOTE — Patient Instructions (Signed)
     IF you received an x-ray today, you will receive an invoice from [facility]. Please contact [facility] at [phone] with questions or concerns regarding your invoice.   IF you received labwork today, you will receive an invoice from [facility]. Please contact [facility] at [phone] with questions or concerns regarding your invoice.   Our billing staff will not be able to assist you with questions regarding bills from these companies.  You will be contacted with the lab results as soon as they are available. The fastest way to get your results is to activate your My Chart account. Instructions are located on the last page of this paperwork. If you have not heard from us regarding the results in 2 weeks, please contact this office.     

## 2018-03-13 NOTE — Assessment & Plan Note (Signed)
Discussed her adherence to meds which is good. Last levels were low for her tsh. Will reassess thyroid

## 2018-03-13 NOTE — Progress Notes (Signed)
Chief Complaint  Patient presents with  . benzodiazepine    follow up    HPI  Benzodiazepine Withdrawal She reports that she is still withdrawing from xanax She still has jitteriness and poor appetite She states that she feels like she is getting back to herself The buspirone made her vomit every time she took it so she stopped The Wellbutrin seems to help. She is taking it bid She states that she wanted to know what else to take to help her jitteriness and anxiety She still has some nervousness about going outside.  She did not follow up with counseling as recommended  Memory Changes She states that she forgets what she is saying mid-sentence She is taking her thyroid medication She sleeps about 5 hours a night When she turned 63 her ambien dose was reduced from 10mg  to 5mg  and she has not slept well since.  Lab Results  Component Value Date   TSH 0.541 11/15/2017   Lab Results  Component Value Date   VITAMINB12 1,787 (H) 02/20/2018    Anxiety and Depression Hypothyroidism She has history of previous anxiety and depression  She is now no longer on benzos She reports that she is taking the wellbutrin She is compliant with her thyroid medication daily Her hands are always purplish but warm She takes the thyroid medication by itself in the morning Denies diarrhea, palpitations, skin changes Lab Results  Component Value Date   TSH 0.541 11/15/2017    Depression screen [facility] 2/9 03/13/2018 02/20/2018 11/15/2017 12/27/2016 12/16/2015  Decreased Interest 0 0 0 0 0  Down, Depressed, Hopeless 0 0 0 0 0  PHQ - 2 Score 0 0 0 0 0  Altered sleeping 2 - - - -  Tired, decreased energy 1 - - - -  Change in appetite 2 - - - -  Feeling bad or failure about yourself  0 - - - -  Trouble concentrating 1 - - - -  Moving slowly or fidgety/restless 2 - - - -  Suicidal thoughts 0 - - - -  PHQ-9 Score 8 - - - -  Difficult doing work/chores Somewhat difficult - - - -  Some recent data  might be hidden   GAD 7 : Generalized Anxiety Score 03/13/2018  Nervous, Anxious, on Edge 1  Control/stop worrying 0  Worry too much - different things 0  Trouble relaxing 1  Restless 1  Easily annoyed or irritable 0  Afraid - awful might happen 0  Total GAD 7 Score 3  Anxiety Difficulty Not difficult at all    Past Medical History:  Diagnosis Date  . Anemia   . Anxiety   . Arthritis    "everywhere" (08/19/2013)  . Bilateral carpal tunnel syndrome   . Chest pain at rest, rule out pericarditis 11/27/2012  . CHF (congestive heart failure) ([place])   . Chronic kidney disease    dr Risa Grill  . Chronic lower back pain   . Complication of anesthesia    "woke up twice during knee replacement" (08/19/2013)  . Depression   . Dyspnea    w/ exertion    . Fibromyalgia   . Headache(784.0)    hx of migraines   . Heart murmur   . History of pericarditis, with pericardial window in 2009 11/27/2012  . Hx of melanoma of skin 11/27/2012  . Hx of ovarian cancer 11/27/2012  . Hypothyroidism 11/27/2012  . Iron deficiency anemia   . Kidney carcinoma ([place])    "  both kidneys; ~ 3 yr apart" (08/19/2013)  . Melanoma of back ([place])   . Migraines   . Pneumonia    "several times; including today", RUL/notes 08/19/2013  . SOB (shortness of breath)    occasional with exertion   . Squamous carcinoma    "face, side of my nose, chest; used acid to get rid of them" (08/19/2013)  . Venous insufficiency 11/27/2012    Current Outpatient Medications  Medication Sig Dispense Refill  . ALPRAZolam (XANAX) 0.5 MG tablet Take 0.5 mg by mouth 2 (two) times daily as needed for anxiety.    Marland Kitchen atorvastatin (LIPITOR) 10 MG tablet Take one tablet by mouth every OTHER day 45 tablet 1  . buPROPion (WELLBUTRIN SR) 150 MG 12 hr tablet Take 1 tablet (150 mg total) by mouth 2 (two) times daily. 60 tablet 4  . cyclobenzaprine (FLEXERIL) 5 MG tablet Take 1 tablet (5 mg total) by mouth 3 (three) times daily as needed. For spasms 30 tablet  0  . diclofenac sodium (VOLTAREN) 1 % GEL Apply 1 application topically 3 (three) times daily as needed (pain).    Marland Kitchen levothyroxine (SYNTHROID, LEVOTHROID) 100 MCG tablet TAKE 1 TABLET BY MOUTH BEFORE BREAKFAST MONDAY THRU FRIDAY SKIP SATURDAY AND SUNDAY 30 tablet 3  . morphine (MS CONTIN) 30 MG 12 hr tablet Take 30 mg by mouth every 8 (eight) hours as needed for pain.     . Multiple Vitamins-Minerals (HAIR/SKIN/NAILS) TABS Take 1 tablet by mouth daily.    . polyethylene glycol (MIRALAX / GLYCOLAX) packet Take 17 g by mouth 2 (two) times daily. (Patient taking differently: Take 17 g by mouth daily as needed for moderate constipation. ) 14 each 0  . PROAIR HFA 108 (90 BASE) MCG/ACT inhaler INHALE 2 PUFFS EVERY 4 HOURS AS NEEDED FOR WHEEZING, COUGH OR SHORTNESS OF BREATH 8.5 g 3  . traMADol (ULTRAM) 50 MG tablet Take 1-2 tablets (50-100 mg total) by mouth every 6 (six) hours as needed (mild pain). (Patient not taking: Reported on 11/01/2017) 30 tablet 0  . triamterene-hydrochlorothiazide (MAXZIDE) 75-50 MG tablet Take 1 tablet by mouth daily.    . vitamin B-12 (CYANOCOBALAMIN) 1000 MCG tablet Take 1,000 mcg by mouth daily.    Marland Kitchen zolpidem (AMBIEN) 5 MG tablet TAKE 1 TABLET BY MOUTH AT BEDTIME AS NEEDED FOR SLEEP. 30 tablet 2   No current facility-administered medications for this visit.     Allergies:  Allergies  Allergen Reactions  . Amoxicillin     UNSPECIFIED REACTION  Has patient had a PCN reaction causing immediate rash, facial/tongue/throat swelling, SOB or lightheadedness with hypotension: No Has patient had a PCN reaction causing severe rash involving mucus membranes or skin necrosis: No Has patient had a PCN reaction that required hospitalization No Has patient had a PCN reaction occurring within the last 10 years: No If all of the above answers are "NO", then may proceed with Cephalosporin use.  [place] [Buspirone] Nausea And Vomiting  . Ampicillin Rash    Has patient had a PCN  reaction causing immediate rash, facial/tongue/throat swelling, SOB or lightheadedness with hypotension: No Has patient had a PCN reaction causing severe rash involving mucus membranes or skin necrosis: No Has patient had a PCN reaction that required hospitalization No Has patient had a PCN reaction occurring within the last 10 years: No If all of the above answers are "NO", then may proceed with Cephalosporin use.    Past Surgical History:  Procedure Laterality Date  .  ABDOMINAL HYSTERECTOMY  1979  . APPENDECTOMY    . DOPPLER ECHOCARDIOGRAPHY  02/23/2011   LVEF>50%  . FRACTURE SURGERY    . JOINT REPLACEMENT     left hip as child  x2  . KNEE ARTHROSCOPY Right    "twice before the replacement" (08/19/2013)  . LEFT OOPHORECTOMY Left 1979  . [place]   "ruptured disc"  . MELANOMA EXCISION  ~ 2010   "my lower back" (08/19/2013)  . PARTIAL NEPHRECTOMY Right 2005; ~ 2008  . PERICARDIAL WINDOW  ~ 2012  . POSTERIOR LUMBAR FUSION  2002?; 03/2012  . REDUCTION MAMMAPLASTY Bilateral ~ 2008  . RIGHT OOPHORECTOMY  ~ 2007   "it had cancer in it" (08/19/2013)  . SHOULDER ARTHROSCOPY W/ ROTATOR CUFF REPAIR Right 1990's  . THORACOSCOPY Right 09/28/2017   VIDEO ASSISTED  . TONSILLECTOMY  1954  . TOTAL KNEE ARTHROPLASTY Right 1990's  . TOTAL KNEE ARTHROPLASTY Left 08/02/2015   Procedure: LEFT TOTAL KNEE ARTHROPLASTY;  Surgeon: Paralee Cancel, MD;  Location: [facility];  Service: Orthopedics;  Laterality: Left;  . TOTAL SHOULDER REPLACEMENT Left 2006?  . TUBAL LIGATION  1952  . VIDEO ASSISTED THORACOSCOPY (VATS)/WEDGE RESECTION Right 09/28/2017   Procedure: RIGHT VIDEO ASSISTED THORACOSCOPY (VATS)/WEDGE RESECTION, LYMPH NODE DISSECTION ;  Surgeon: Melrose Nakayama, MD;  Location: [facility];  Service: Thoracic;  Laterality: Right;    Social History   Socioeconomic History  . Marital status: Married    Spouse name: Not on file  . Number of children: Not on file  . Years of education:  Not on file  . Highest education level: Not on file  Occupational History  . Not on file  Social Needs  . Financial resource strain: Not on file  . Food insecurity:    Worry: Not on file    Inability: Not on file  . Transportation needs:    Medical: Not on file    Non-medical: Not on file  Tobacco Use  . Smoking status: Former Smoker    Packs/day: 1.50    Years: 18.00    Pack years: 27.00    Types: Cigarettes    Last attempt to quit: 09/22/1985    Years since quitting: 32.4  . Smokeless tobacco: Never Used  Substance and Sexual Activity  . Alcohol use: No  . Drug use: No    Comment: prescribed  . Sexual activity: Never  Lifestyle  . Physical activity:    Days per week: Not on file    Minutes per session: Not on file  . Stress: Not on file  Relationships  . Social connections:    Talks on phone: Not on file    Gets together: Not on file    Attends religious service: Not on file    Active member of club or organization: Not on file    Attends meetings of clubs or organizations: Not on file    Relationship status: Not on file  Other Topics Concern  . Not on file  Social History Narrative   Married.    Family History  Problem Relation Age of Onset  . Cancer Mother   . Cancer Father   . Bipolar disorder Sister      ROS Review of Systems See HPI Constitution: No fevers or chills No malaise No diaphoresis Skin: No rash or itching Eyes: no blurry vision, no double vision GU: no dysuria or hematuria Neuro: no dizziness or headaches, +mild shakiness  all others reviewed and  negative   Objective: Vitals:   03/13/18 1421  BP: 118/80  Pulse: (!) 102  Resp: 16  Temp: 97.7 F (36.5 C)  TempSrc: Oral  SpO2: 99%  Weight: 149 lb 12.8 oz (67.9 kg)  Height: 5\' 6"  (1.676 m)    Physical Exam  Constitutional: She is oriented to person, place, and time. She appears well-developed and well-nourished.  HENT:  Head: Normocephalic and atraumatic.  Eyes:  Conjunctivae and EOM are normal.  Cardiovascular: Normal rate, regular rhythm and normal heart sounds.  No murmur heard. Pulmonary/Chest: Effort normal and breath sounds normal. No stridor. No respiratory distress. She has no wheezes.  Neurological: She is alert and oriented to person, place, and time.  Skin: Skin is warm. Capillary refill takes less than 2 seconds.  Psychiatric: She has a normal mood and affect. Her behavior is normal. Judgment and thought content normal.    Assessment and Plan Debera was seen today for benzodiazepine.  Diagnoses and all orders for this visit:   Problem List Items Addressed This Visit      Endocrine   Hypothyroidism (Chronic)    Discussed her adherence to meds which is good. Last levels were low for her tsh. Will reassess thyroid      Relevant Orders   TSH     Genitourinary   Acute on chronic renal insufficiency    Patient has upcoming follow up with Nephrology Discussed her creatinine which is improving as well as her low potassium      Relevant Orders   Comprehensive metabolic panel   CBC     Other   Benzodiazepine withdrawal without complication (HCC) - Primary    Pt with wellbutrin for chronic anxiety but now off benzos Pt is also at the cancer center for early lung cancer and currently needs something for her anxiety. Discussed that she should follow up with Psychiatry to help with adjusting her meds and to assess her cognitive decline.       Relevant Orders   Ambulatory referral to Psychiatry   Hypokalemia    Poor PO intake. Improving over the past month.       Relevant Orders   Comprehensive metabolic panel   Generalized anxiety disorder   Relevant Orders   TSH   Ambulatory referral to Psychiatry   Acute cognitive decline    Still concerning for acute delirium over chronic dementia      Relevant Orders   Ambulatory referral to Psychiatry       [name]

## 2018-03-13 NOTE — Assessment & Plan Note (Signed)
Patient has upcoming follow up with Nephrology Discussed her creatinine which is improving as well as her low potassium

## 2018-03-14 LAB — COMPREHENSIVE METABOLIC PANEL
ALT: 16 IU/L (ref 0–32)
AST: 21 IU/L (ref 0–40)
Albumin/Globulin Ratio: 1.6 (ref 1.2–2.2)
Albumin: 4.4 g/dL (ref 3.6–4.8)
Alkaline Phosphatase: 109 IU/L (ref 39–117)
BUN/Creatinine Ratio: 19 (ref 12–28)
BUN: 42 mg/dL — ABNORMAL HIGH (ref 8–27)
Bilirubin Total: 0.3 mg/dL (ref 0.0–1.2)
CO2: 20 mmol/L (ref 20–29)
Calcium: 9.1 mg/dL (ref 8.7–10.3)
Chloride: 100 mmol/L (ref 96–106)
Creatinine, Ser: 2.16 mg/dL — ABNORMAL HIGH (ref 0.57–1.00)
GFR calc Af Amer: 26 mL/min/{1.73_m2} — ABNORMAL LOW (ref >59)
GFR calc non Af Amer: 23 mL/min/{1.73_m2} — ABNORMAL LOW (ref >59)
Globulin, Total: 2.7 g/dL (ref 1.5–4.5)
Glucose: 138 mg/dL — ABNORMAL HIGH (ref 65–99)
Potassium: 3.4 mmol/L — ABNORMAL LOW (ref 3.5–5.2)
Sodium: 144 mmol/L (ref 134–144)
Total Protein: 7.1 g/dL (ref 6.0–8.5)

## 2018-03-14 LAB — CBC
Hematocrit: 35.9 % (ref 34.0–46.6)
Hemoglobin: 11.8 g/dL (ref 11.1–15.9)
MCH: 28.4 pg (ref 26.6–33.0)
MCHC: 32.9 g/dL (ref 31.5–35.7)
MCV: 87 fL (ref 79–97)
Platelets: 224 10*3/uL (ref 150–450)
RBC: 4.15 x10E6/uL (ref 3.77–5.28)
RDW: 14.6 % (ref 12.3–15.4)
WBC: 7.8 10*3/uL (ref 3.4–10.8)

## 2018-03-14 LAB — TSH: TSH: 0.795 u[IU]/mL (ref 0.450–4.500)

## 2018-03-29 DIAGNOSIS — M5417 Radiculopathy, lumbosacral region: Secondary | ICD-10-CM | POA: Diagnosis not present

## 2018-03-29 DIAGNOSIS — Z981 Arthrodesis status: Secondary | ICD-10-CM | POA: Diagnosis not present

## 2018-03-29 DIAGNOSIS — M5116 Intervertebral disc disorders with radiculopathy, lumbar region: Secondary | ICD-10-CM | POA: Diagnosis not present

## 2018-04-04 DIAGNOSIS — M170 Bilateral primary osteoarthritis of knee: Secondary | ICD-10-CM | POA: Diagnosis not present

## 2018-04-04 DIAGNOSIS — Z79891 Long term (current) use of opiate analgesic: Secondary | ICD-10-CM | POA: Diagnosis not present

## 2018-04-04 DIAGNOSIS — M67919 Unspecified disorder of synovium and tendon, unspecified shoulder: Secondary | ICD-10-CM | POA: Diagnosis not present

## 2018-04-04 DIAGNOSIS — G894 Chronic pain syndrome: Secondary | ICD-10-CM | POA: Diagnosis not present

## 2018-04-26 ENCOUNTER — Other Ambulatory Visit: Payer: Self-pay | Admitting: Family Medicine

## 2018-04-29 ENCOUNTER — Inpatient Hospital Stay: Payer: Medicare Other | Attending: Internal Medicine

## 2018-04-29 ENCOUNTER — Other Ambulatory Visit: Payer: Self-pay | Admitting: *Deleted

## 2018-04-29 ENCOUNTER — Ambulatory Visit (HOSPITAL_COMMUNITY)
Admission: RE | Admit: 2018-04-29 | Discharge: 2018-04-29 | Disposition: A | Discharge status: Home / Self Care | Payer: Medicare Other | Source: Ambulatory Visit | Attending: Internal Medicine | Admitting: Internal Medicine

## 2018-04-29 ENCOUNTER — Encounter (HOSPITAL_COMMUNITY): Payer: Self-pay

## 2018-04-29 DIAGNOSIS — Z85118 Personal history of other malignant neoplasm of bronchus and lung: Secondary | ICD-10-CM | POA: Diagnosis not present

## 2018-04-29 DIAGNOSIS — C3411 Malignant neoplasm of upper lobe, right bronchus or lung: Secondary | ICD-10-CM

## 2018-04-29 DIAGNOSIS — R918 Other nonspecific abnormal finding of lung field: Secondary | ICD-10-CM | POA: Diagnosis not present

## 2018-04-29 DIAGNOSIS — Z85820 Personal history of malignant melanoma of skin: Secondary | ICD-10-CM | POA: Diagnosis not present

## 2018-04-29 DIAGNOSIS — C3490 Malignant neoplasm of unspecified part of unspecified bronchus or lung: Secondary | ICD-10-CM

## 2018-04-29 DIAGNOSIS — Z85528 Personal history of other malignant neoplasm of kidney: Secondary | ICD-10-CM | POA: Diagnosis not present

## 2018-04-29 DIAGNOSIS — I700 Atherosclerosis of aorta: Secondary | ICD-10-CM | POA: Insufficient documentation

## 2018-04-29 DIAGNOSIS — K838 Other specified diseases of biliary tract: Secondary | ICD-10-CM | POA: Diagnosis not present

## 2018-04-29 DIAGNOSIS — Z8543 Personal history of malignant neoplasm of ovary: Secondary | ICD-10-CM | POA: Diagnosis not present

## 2018-04-29 LAB — CMP (CANCER CENTER ONLY)
ALT: 18 U/L (ref 0–44)
AST: 19 U/L (ref 15–41)
Albumin: 3.9 g/dL (ref 3.5–5.0)
Alkaline Phosphatase: 91 U/L (ref 38–126)
Anion gap: 8 (ref 5–15)
BUN: 35 mg/dL — ABNORMAL HIGH (ref 8–23)
CO2: 31 mmol/L (ref 22–32)
Calcium: 9.8 mg/dL (ref 8.9–10.3)
Chloride: 102 mmol/L (ref 98–111)
Creatinine: 1.45 mg/dL — ABNORMAL HIGH (ref 0.44–1.00)
GFR, Est AFR Am: 42 mL/min — ABNORMAL LOW (ref >60)
GFR, Estimated: 36 mL/min — ABNORMAL LOW (ref >60)
Glucose, Bld: 96 mg/dL (ref 70–99)
Potassium: 3.7 mmol/L (ref 3.5–5.1)
Sodium: 141 mmol/L (ref 135–145)
Total Bilirubin: 0.4 mg/dL (ref 0.3–1.2)
Total Protein: 6.9 g/dL (ref 6.5–8.1)

## 2018-04-29 LAB — CBC WITH DIFFERENTIAL (CANCER CENTER ONLY)
Basophils Absolute: 0 10*3/uL (ref 0.0–0.1)
Basophils Relative: 1 %
Eosinophils Absolute: 0.1 10*3/uL (ref 0.0–0.5)
Eosinophils Relative: 1 %
HCT: 36.1 % (ref 34.8–46.6)
Hemoglobin: 11.9 g/dL (ref 11.6–15.9)
Lymphocytes Relative: 29 %
Lymphs Abs: 1.5 10*3/uL (ref 0.9–3.3)
MCH: 28.9 pg (ref 25.1–34.0)
MCHC: 32.9 g/dL (ref 31.5–36.0)
MCV: 88 fL (ref 79.5–101.0)
Monocytes Absolute: 0.3 10*3/uL (ref 0.1–0.9)
Monocytes Relative: 5 %
Neutro Abs: 3.3 10*3/uL (ref 1.5–6.5)
Neutrophils Relative %: 64 %
Platelet Count: 174 10*3/uL (ref 145–400)
RBC: 4.11 MIL/uL (ref 3.70–5.45)
RDW: 14.2 % (ref 11.2–14.5)
WBC Count: 5.1 10*3/uL (ref 3.9–10.3)

## 2018-04-29 MED ORDER — IOHEXOL 300 MG/ML  SOLN
75.0000 mL | Freq: Once | INTRAMUSCULAR | Status: AC | PRN
  Administered 2018-04-29: 60 mL via INTRAVENOUS

## 2018-04-30 ENCOUNTER — Ambulatory Visit (INDEPENDENT_AMBULATORY_CARE_PROVIDER_SITE_OTHER): Payer: Medicare Other | Admitting: Thoracic Surgery (Cardiothoracic Vascular Surgery)

## 2018-04-30 VITALS — BP 136/76 | HR 96 | Resp 20 | Ht 66.0 in | Wt 156.0 lb

## 2018-04-30 DIAGNOSIS — C3411 Malignant neoplasm of upper lobe, right bronchus or lung: Secondary | ICD-10-CM

## 2018-04-30 NOTE — Progress Notes (Signed)
[address]       [place] [address]             [phone]     HPI: [name] returns for a scheduled six-month follow-up visit  [name] is a 67 year old woman with a remote history of tobacco abuse, depression, anxiety, congestive heart failure, renal cell carcinoma, ovarian carcinoma, melanoma, fibromyalgia, chronic pain, stage III chronic kidney disease, venous insufficiency.  She had a wedge resection for a well-differentiated adenocarcinoma in December 2018.  She did well postoperatively and went home on day 4.  Last saw her in the office on 10/22/2017.  She was doing well at that time.  She was not taking any pain medication.  She saw Dr. Julien Nordmann in January.  He did not recommend any adjuvant therapy.  She is on observation.  She says she is been doing well.  She has stopped taking Xanax and went through some withdrawal issues with that.  She has not had any problems with her breathing.  She does not have any residual incisional pain.  She denies any abdominal pain.  Past Medical History:  Diagnosis Date  . Anemia   . Anxiety   . Arthritis    "everywhere" (08/19/2013)  . Bilateral carpal tunnel syndrome   . Chest pain at rest, rule out pericarditis 11/27/2012  . CHF (congestive heart failure) ([place])   . Chronic kidney disease    dr Risa Grill  . Chronic lower back pain   . Complication of anesthesia    "woke up twice during knee replacement" (08/19/2013)  . Depression   . Dyspnea    w/ exertion    . Fibromyalgia   . Headache(784.0)    hx of migraines   . Heart murmur   . History of pericarditis, with pericardial window in 2009 11/27/2012  . Hx of melanoma of skin 11/27/2012  . Hx of ovarian cancer 11/27/2012  . Hypothyroidism 11/27/2012  . Iron deficiency anemia   . Kidney carcinoma ([place])    "both kidneys; ~ 3 yr apart" (08/19/2013)  . Melanoma of back ([place])   . Migraines   . Pneumonia    "several times; including today", RUL/notes 08/19/2013  .  SOB (shortness of breath)    occasional with exertion   . Squamous carcinoma    "face, side of my nose, chest; used acid to get rid of them" (08/19/2013)  . Venous insufficiency 11/27/2012    Current Outpatient Medications  Medication Sig Dispense Refill  . buPROPion (WELLBUTRIN SR) 150 MG 12 hr tablet Take 1 tablet (150 mg total) by mouth 2 (two) times daily. 60 tablet 4  . cyclobenzaprine (FLEXERIL) 5 MG tablet Take 1 tablet (5 mg total) by mouth 3 (three) times daily as needed. For spasms 30 tablet 0  . diclofenac sodium (VOLTAREN) 1 % GEL Apply 1 application topically 3 (three) times daily as needed (pain).    Marland Kitchen levothyroxine (SYNTHROID, LEVOTHROID) 100 MCG tablet TAKE 1 TABLET BY MOUTH BEFORE BREAKFAST MONDAY-FRIDAY SKIP SATURDAY ANS SUNDAY 30 tablet 3  . morphine (MS CONTIN) 30 MG 12 hr tablet Take 30 mg by mouth every 8 (eight) hours as needed for pain.     . Multiple Vitamins-Minerals (HAIR/SKIN/NAILS) TABS Take 1 tablet by mouth daily.    . polyethylene glycol (MIRALAX / GLYCOLAX) packet Take 17 g by mouth 2 (two) times daily. (Patient taking differently: Take 17 g by mouth daily as needed for moderate constipation. )  14 each 0  . PROAIR HFA 108 (90 BASE) MCG/ACT inhaler INHALE 2 PUFFS EVERY 4 HOURS AS NEEDED FOR WHEEZING, COUGH OR SHORTNESS OF BREATH 8.5 g 3  . traMADol (ULTRAM) 50 MG tablet Take 1-2 tablets (50-100 mg total) by mouth every 6 (six) hours as needed (mild pain). 30 tablet 0  . triamterene-hydrochlorothiazide (MAXZIDE) 75-50 MG tablet Take 1 tablet by mouth daily.    . vitamin B-12 (CYANOCOBALAMIN) 1000 MCG tablet Take 1,000 mcg by mouth daily.    Marland Kitchen zolpidem (AMBIEN) 5 MG tablet TAKE 1 TABLET BY MOUTH AT BEDTIME AS NEEDED FOR SLEEP. 30 tablet 2   No current facility-administered medications for this visit.     Physical Exam BP 136/76   Pulse 96   Resp 20   Ht 5\' 6"  (1.676 m)   Wt 156 lb (70.8 kg)   SpO2 98% Comment: RA  BMI 25.29 kg/m  67 year old woman in no  acute distress Alert and oriented x3 with no focal deficits Lungs clear with equal breath sounds bilaterally Incisions well-healed No cervical or supraclavicular adenopathy Cardiac regular rate and rhythm normal S1-S2  Diagnostic Tests: CT CHEST WITH CONTRAST  TECHNIQUE: Multidetector CT imaging of the chest was performed during intravenous contrast administration.  CONTRAST:  27mL OMNIPAQUE IOHEXOL 300 MG/ML  SOLN  COMPARISON:  Chest CT 07/13/2017.  PET-CT 08/21/2017.  FINDINGS: Cardiovascular: Atherosclerosis of the aorta, great vessels and coronary arteries. No acute vascular findings are demonstrated. The heart size is normal. There is no pericardial effusion.  Mediastinum/Nodes: There are no enlarged mediastinal, hilar or axillary lymph nodes. The thyroid gland, trachea and esophagus demonstrate no significant findings.  Lungs/Pleura: There is no pleural effusion. Interval wedge resection of the part solid right upper lobe nodule. There is mild linear scarring surrounding the sutures. No new or enlarging pulmonary nodules.  Upper abdomen: There is extrahepatic biliary dilatation. The common hepatic duct measures up to 15 mm in diameter on coronal image 40/5. The gallbladder is present. There is diffuse fatty replacement of the pancreas. Probable small bilateral renal cysts.  Musculoskeletal/Chest wall: There is no chest wall mass or suspicious osseous finding. Left shoulder reverse arthroplasty and previous spinal augmentation at L1 noted.  IMPRESSION: 1. Interval wedge resection of the part solid right upper lobe lesion. No evidence of metastatic disease or new pulmonary nodule. 2. Extrahepatic biliary dilatation of uncertain etiology and significance. Liver function studies performed today were normal. 3. Coronary and Aortic Atherosclerosis (ICD10-I70.0).   Electronically Signed   By: Richardean Sale M.D.   On: 04/29/2018 14:20 I personally  reviewed the CT images and concur with the findings noted above  Impression: [name] is a 67 year old woman who had a wedge resection for a T1, N0, stage IA well-differentiated adenocarcinoma 6 months ago.  She is doing well currently with no evidence of recurrent disease.  She is not having any respiratory issues or incisional pain.  She quit smoking in 1986.  Dilated extrahepatic biliary system-normal LFTs yesterday.  Uncertain significance.  Will defer to Dr. Julien Nordmann.  Plan: She will follow-up with Dr. Julien Nordmann.  I will be happy to see her back again anytime in the future if I can be of any further assistance with her care  Melrose Nakayama, MD [facility] [phone]

## 2018-05-01 ENCOUNTER — Telehealth: Payer: Self-pay | Admitting: Internal Medicine

## 2018-05-01 ENCOUNTER — Encounter: Payer: Self-pay | Admitting: Internal Medicine

## 2018-05-01 ENCOUNTER — Inpatient Hospital Stay (HOSPITAL_BASED_OUTPATIENT_CLINIC_OR_DEPARTMENT_OTHER): Payer: Medicare Other | Admitting: Internal Medicine

## 2018-05-01 VITALS — BP 151/88 | HR 86 | Temp 97.6°F | Resp 18 | Ht 66.0 in | Wt 155.3 lb

## 2018-05-01 DIAGNOSIS — Z85528 Personal history of other malignant neoplasm of kidney: Secondary | ICD-10-CM | POA: Diagnosis not present

## 2018-05-01 DIAGNOSIS — C3490 Malignant neoplasm of unspecified part of unspecified bronchus or lung: Secondary | ICD-10-CM

## 2018-05-01 DIAGNOSIS — Z85118 Personal history of other malignant neoplasm of bronchus and lung: Secondary | ICD-10-CM | POA: Diagnosis not present

## 2018-05-01 DIAGNOSIS — Z8543 Personal history of malignant neoplasm of ovary: Secondary | ICD-10-CM | POA: Diagnosis not present

## 2018-05-01 DIAGNOSIS — Z85820 Personal history of malignant melanoma of skin: Secondary | ICD-10-CM

## 2018-05-01 DIAGNOSIS — C3491 Malignant neoplasm of unspecified part of right bronchus or lung: Secondary | ICD-10-CM

## 2018-05-01 DIAGNOSIS — K838 Other specified diseases of biliary tract: Secondary | ICD-10-CM

## 2018-05-01 NOTE — Progress Notes (Signed)
[facility] [phone]   [phone]  OFFICE PROGRESS NOTE  Forrest Moron, MD [place] [address]  DIAGNOSIS: Stage IA (T1a, N0, M0) non-small cell lung cancer, well-differentiated adenocarcinoma.  PRIOR THERAPY:  Status post wedge resection of the right upper lobe under the care of Dr. Roxan Hockey on September 28, 2017.  CURRENT THERAPY: Observation.  INTERVAL HISTORY: [name] 67 y.o. female returns to the clinic today for 6 months follow-up visit.  The patient is feeling fine today with no concerning complaints.  The patient denied having any chest pain, shortness of breath, cough or hemoptysis.  She has no recent weight loss or night sweats.  She has no nausea, vomiting, diarrhea or constipation.  She has been in observation since her surgery.  She had repeat CT scan of the chest performed recently and she is here for evaluation and discussion of her risk her results.  MEDICAL HISTORY: Past Medical History:  Diagnosis Date  . Anemia   . Anxiety   . Arthritis    "everywhere" (08/19/2013)  . Bilateral carpal tunnel syndrome   . Chest pain at rest, rule out pericarditis 11/27/2012  . CHF (congestive heart failure) ([facility])   . Chronic kidney disease    dr Risa Grill  . Chronic lower back pain   . Complication of anesthesia    "woke up twice during knee replacement" (08/19/2013)  . Depression   . Dyspnea    w/ exertion    . Fibromyalgia   . Headache(784.0)    hx of migraines   . Heart murmur   . History of pericarditis, with pericardial window in 2009 11/27/2012  . Hx of melanoma of skin 11/27/2012  . Hx of ovarian cancer 11/27/2012  . Hypothyroidism 11/27/2012  . Iron deficiency anemia   . Kidney carcinoma ([facility])    "both kidneys; ~ 3 yr apart" (08/19/2013)  . Melanoma of back ([facility])   . Migraines   . Pneumonia    "several times; including today", RUL/notes 08/19/2013  . SOB (shortness of breath)    occasional with  exertion   . Squamous carcinoma    "face, side of my nose, chest; used acid to get rid of them" (08/19/2013)  . Venous insufficiency 11/27/2012    ALLERGIES:  is allergic to amoxicillin; buspar [buspirone]; and ampicillin.  MEDICATIONS:  Current Outpatient Medications  Medication Sig Dispense Refill  . buPROPion (WELLBUTRIN SR) 150 MG 12 hr tablet Take 1 tablet (150 mg total) by mouth 2 (two) times daily. 60 tablet 4  . cyclobenzaprine (FLEXERIL) 5 MG tablet Take 1 tablet (5 mg total) by mouth 3 (three) times daily as needed. For spasms 30 tablet 0  . diclofenac sodium (VOLTAREN) 1 % GEL Apply 1 application topically 3 (three) times daily as needed (pain).    Marland Kitchen levothyroxine (SYNTHROID, LEVOTHROID) 100 MCG tablet TAKE 1 TABLET BY MOUTH BEFORE BREAKFAST MONDAY-FRIDAY SKIP SATURDAY ANS SUNDAY 30 tablet 3  . morphine (MS CONTIN) 30 MG 12 hr tablet Take 30 mg by mouth every 8 (eight) hours as needed for pain.     . Multiple Vitamins-Minerals (HAIR/SKIN/NAILS) TABS Take 1 tablet by mouth daily.    . polyethylene glycol (MIRALAX / GLYCOLAX) packet Take 17 g by mouth 2 (two) times daily. (Patient taking differently: Take 17 g by mouth daily as needed for moderate constipation. ) 14 each 0  . PROAIR HFA 108 (90 BASE) MCG/ACT inhaler INHALE 2 PUFFS  EVERY 4 HOURS AS NEEDED FOR WHEEZING, COUGH OR SHORTNESS OF BREATH 8.5 g 3  . traMADol (ULTRAM) 50 MG tablet Take 1-2 tablets (50-100 mg total) by mouth every 6 (six) hours as needed (mild pain). 30 tablet 0  . triamterene-hydrochlorothiazide (MAXZIDE) 75-50 MG tablet Take 1 tablet by mouth daily.    . vitamin B-12 (CYANOCOBALAMIN) 1000 MCG tablet Take 1,000 mcg by mouth daily.    Marland Kitchen zolpidem (AMBIEN) 5 MG tablet TAKE 1 TABLET BY MOUTH AT BEDTIME AS NEEDED FOR SLEEP. 30 tablet 2   No current facility-administered medications for this visit.     SURGICAL HISTORY:  Past Surgical History:  Procedure Laterality Date  . ABDOMINAL HYSTERECTOMY  1979  .  APPENDECTOMY    . DOPPLER ECHOCARDIOGRAPHY  02/23/2011   LVEF>50%  . FRACTURE SURGERY    . JOINT REPLACEMENT     left hip as child  x2  . KNEE ARTHROSCOPY Right    "twice before the replacement" (08/19/2013)  . LEFT OOPHORECTOMY Left 1979  . [place]   "ruptured disc"  . MELANOMA EXCISION  ~ 2010   "my lower back" (08/19/2013)  . PARTIAL NEPHRECTOMY Right 2005; ~ 2008  . PERICARDIAL WINDOW  ~ 2012  . POSTERIOR LUMBAR FUSION  2002?; 03/2012  . REDUCTION MAMMAPLASTY Bilateral ~ 2008  . RIGHT OOPHORECTOMY  ~ 2007   "it had cancer in it" (08/19/2013)  . SHOULDER ARTHROSCOPY W/ ROTATOR CUFF REPAIR Right 1990's  . THORACOSCOPY Right 09/28/2017   VIDEO ASSISTED  . TONSILLECTOMY  1954  . TOTAL KNEE ARTHROPLASTY Right 1990's  . TOTAL KNEE ARTHROPLASTY Left 08/02/2015   Procedure: LEFT TOTAL KNEE ARTHROPLASTY;  Surgeon: Paralee Cancel, MD;  Location: [facility];  Service: Orthopedics;  Laterality: Left;  . TOTAL SHOULDER REPLACEMENT Left 2006?  . TUBAL LIGATION  1952  . VIDEO ASSISTED THORACOSCOPY (VATS)/WEDGE RESECTION Right 09/28/2017   Procedure: RIGHT VIDEO ASSISTED THORACOSCOPY (VATS)/WEDGE RESECTION, LYMPH NODE DISSECTION ;  Surgeon: Melrose Nakayama, MD;  Location: [facility];  Service: Thoracic;  Laterality: Right;    REVIEW OF SYSTEMS:  A comprehensive review of systems was negative.   PHYSICAL EXAMINATION: General appearance: alert, cooperative and no distress Head: Normocephalic, without obvious abnormality, atraumatic Neck: no adenopathy, no JVD, supple, symmetrical, trachea midline and thyroid not enlarged, symmetric, no tenderness/mass/nodules Lymph nodes: Cervical, supraclavicular, and axillary nodes normal. Resp: clear to auscultation bilaterally Back: symmetric, no curvature. ROM normal. No CVA tenderness. Cardio: regular rate and rhythm, S1, S2 normal, no murmur, click, rub or gallop GI: soft, non-tender; bowel sounds normal; no masses,  no  organomegaly Extremities: extremities normal, atraumatic, no cyanosis or edema  ECOG PERFORMANCE STATUS: 1 - Symptomatic but completely ambulatory  Blood pressure (!) 151/88, pulse 86, temperature 97.6 F (36.4 C), temperature source Oral, resp. rate 18, height 5\' 6"  (1.676 m), weight 155 lb 4.8 oz (70.4 kg), SpO2 100 %.  LABORATORY DATA: Lab Results  Component Value Date   WBC 5.1 04/29/2018   HGB 11.9 04/29/2018   HCT 36.1 04/29/2018   MCV 88.0 04/29/2018   PLT 174 04/29/2018      Chemistry      Component Value Date/Time   NA 141 04/29/2018 1109   NA 144 03/13/2018 1427   K 3.7 04/29/2018 1109   CL 102 04/29/2018 1109   CO2 31 04/29/2018 1109   BUN 35 (H) 04/29/2018 1109   BUN 42 (H) 03/13/2018 1427   CREATININE 1.45 (H) 04/29/2018  1109   CREATININE 1.33 (H) 09/02/2015 1220      Component Value Date/Time   CALCIUM 9.8 04/29/2018 1109   ALKPHOS 91 04/29/2018 1109   AST 19 04/29/2018 1109   ALT 18 04/29/2018 1109   BILITOT 0.4 04/29/2018 1109       RADIOGRAPHIC STUDIES: Ct Chest W Contrast  Result Date: 04/29/2018 CLINICAL DATA:  Lung cancer diagnosed in November 2018 with subsequent thoracic surgery. Remote history of bilateral renal cancer. EXAM: CT CHEST WITH CONTRAST TECHNIQUE: Multidetector CT imaging of the chest was performed during intravenous contrast administration. CONTRAST:  7mL OMNIPAQUE IOHEXOL 300 MG/ML  SOLN COMPARISON:  Chest CT 07/13/2017.  PET-CT 08/21/2017. FINDINGS: Cardiovascular: Atherosclerosis of the aorta, great vessels and coronary arteries. No acute vascular findings are demonstrated. The heart size is normal. There is no pericardial effusion. Mediastinum/Nodes: There are no enlarged mediastinal, hilar or axillary lymph nodes. The thyroid gland, trachea and esophagus demonstrate no significant findings. Lungs/Pleura: There is no pleural effusion. Interval wedge resection of the part solid right upper lobe nodule. There is mild linear scarring  surrounding the sutures. No new or enlarging pulmonary nodules. Upper abdomen: There is extrahepatic biliary dilatation. The common hepatic duct measures up to 15 mm in diameter on coronal image 40/5. The gallbladder is present. There is diffuse fatty replacement of the pancreas. Probable small bilateral renal cysts. Musculoskeletal/Chest wall: There is no chest wall mass or suspicious osseous finding. Left shoulder reverse arthroplasty and previous spinal augmentation at L1 noted. IMPRESSION: 1. Interval wedge resection of the part solid right upper lobe lesion. No evidence of metastatic disease or new pulmonary nodule. 2. Extrahepatic biliary dilatation of uncertain etiology and significance. Liver function studies performed today were normal. 3. Coronary and Aortic Atherosclerosis (ICD10-I70.0). Electronically Signed   By: Richardean Sale M.D.   On: 04/29/2018 14:20    ASSESSMENT AND PLAN: This is a very pleasant 67 years old white female with history of a stage IA non-small cell lung cancer, adenocarcinoma status post wedge resection of the right upper lobe in December 2018.  The patient is currently on observation and she is feeling fine. Repeat CT scan of the chest performed recently showed no concerning findings for disease recurrence or progression.  The scan also showed extrahepatic biliary dilatation of unclear etiology and the patient had normal liver function on the blood work performed in the same day. I discussed the scan results with the patient and recommended for her to continue in observation with repeat CT scan of the chest in 6 months. Regarding the biliary dilatation, she was advised to call immediately if she has any right upper quadrant pain or development of jaundice.  She will also need to see her primary care physician or gastroenterologist for evaluation at that time. The patient was advised to call immediately if she has any concerning symptoms in the interval. The patient voices  understanding of current disease status and treatment options and is in agreement with the current care plan.  All questions were answered. The patient knows to call the clinic with any problems, questions or concerns. We can certainly see the patient much sooner if necessary.  I spent 10 minutes counseling the patient face to face. The total time spent in the appointment was 15 minutes.  Disclaimer: This note was dictated with voice recognition software. Similar sounding words can inadvertently be transcribed and may not be corrected upon review.

## 2018-05-01 NOTE — Telephone Encounter (Signed)
Scheduled appt per 7/10 los - sent reminder letter in the mail with appt date and time - lab /ct and follow up in 6 months.

## 2018-05-10 ENCOUNTER — Other Ambulatory Visit: Payer: Self-pay | Admitting: Family Medicine

## 2018-06-06 ENCOUNTER — Telehealth: Payer: Self-pay | Admitting: Family Medicine

## 2018-06-06 DIAGNOSIS — R030 Elevated blood-pressure reading, without diagnosis of hypertension: Secondary | ICD-10-CM | POA: Diagnosis not present

## 2018-06-06 DIAGNOSIS — M5416 Radiculopathy, lumbar region: Secondary | ICD-10-CM | POA: Diagnosis not present

## 2018-06-06 DIAGNOSIS — Z6825 Body mass index (BMI) 25.0-25.9, adult: Secondary | ICD-10-CM | POA: Diagnosis not present

## 2018-06-06 NOTE — Telephone Encounter (Signed)
Left a voicemail in regards to her appt she has with Dr. Nolon Rod on 07/15/2018. The provider will not be in the office on that day and needs to be rescheduled.

## 2018-06-19 ENCOUNTER — Encounter: Payer: Self-pay | Admitting: Family Medicine

## 2018-06-25 ENCOUNTER — Other Ambulatory Visit: Payer: Self-pay | Admitting: Neurological Surgery

## 2018-06-25 DIAGNOSIS — M5416 Radiculopathy, lumbar region: Secondary | ICD-10-CM

## 2018-06-28 ENCOUNTER — Other Ambulatory Visit: Payer: Self-pay | Admitting: Neurological Surgery

## 2018-06-28 ENCOUNTER — Ambulatory Visit
Admission: RE | Admit: 2018-06-28 | Discharge: 2018-06-28 | Disposition: A | Discharge status: Home / Self Care | Payer: Medicare Other | Source: Ambulatory Visit | Attending: Neurological Surgery | Admitting: Neurological Surgery

## 2018-06-28 DIAGNOSIS — M5416 Radiculopathy, lumbar region: Secondary | ICD-10-CM

## 2018-06-28 DIAGNOSIS — M48061 Spinal stenosis, lumbar region without neurogenic claudication: Secondary | ICD-10-CM | POA: Diagnosis not present

## 2018-07-03 DIAGNOSIS — M48062 Spinal stenosis, lumbar region with neurogenic claudication: Secondary | ICD-10-CM | POA: Diagnosis not present

## 2018-07-03 DIAGNOSIS — R030 Elevated blood-pressure reading, without diagnosis of hypertension: Secondary | ICD-10-CM | POA: Diagnosis not present

## 2018-07-10 ENCOUNTER — Other Ambulatory Visit: Payer: Self-pay | Admitting: Neurological Surgery

## 2018-07-13 ENCOUNTER — Encounter (HOSPITAL_COMMUNITY): Payer: Self-pay | Admitting: *Deleted

## 2018-07-13 NOTE — Progress Notes (Signed)
Denies chest pain or cardiology visit. States she has mild shob that is unchanged post wedge resection.

## 2018-07-15 ENCOUNTER — Ambulatory Visit: Payer: Medicare Other | Admitting: Family Medicine

## 2018-07-15 MED ORDER — VANCOMYCIN HCL IN DEXTROSE 1-5 GM/200ML-% IV SOLN
1000.0000 mg | INTRAVENOUS | Status: AC
  Administered 2018-07-16: 1000 mg via INTRAVENOUS
  Filled 2018-07-15: qty 200

## 2018-07-16 ENCOUNTER — Inpatient Hospital Stay (HOSPITAL_COMMUNITY)
Admission: RE | Admit: 2018-07-16 | Discharge: 2018-07-18 | DRG: 457 | Disposition: A | Discharge status: Home / Self Care | Payer: Medicare Other | Source: Ambulatory Visit | Attending: Neurological Surgery | Admitting: Neurological Surgery

## 2018-07-16 ENCOUNTER — Encounter (HOSPITAL_COMMUNITY)
Admission: RE | Disposition: A | Discharge status: Home / Self Care | Payer: Self-pay | Source: Ambulatory Visit | Attending: Neurological Surgery

## 2018-07-16 ENCOUNTER — Other Ambulatory Visit: Payer: Self-pay

## 2018-07-16 ENCOUNTER — Inpatient Hospital Stay (HOSPITAL_COMMUNITY): Payer: Medicare Other | Admitting: Certified Registered Nurse Anesthetist

## 2018-07-16 ENCOUNTER — Inpatient Hospital Stay (HOSPITAL_COMMUNITY): Payer: Medicare Other

## 2018-07-16 ENCOUNTER — Encounter (HOSPITAL_COMMUNITY): Payer: Self-pay

## 2018-07-16 DIAGNOSIS — Z981 Arthrodesis status: Secondary | ICD-10-CM | POA: Diagnosis not present

## 2018-07-16 DIAGNOSIS — Z85820 Personal history of malignant melanoma of skin: Secondary | ICD-10-CM

## 2018-07-16 DIAGNOSIS — F329 Major depressive disorder, single episode, unspecified: Secondary | ICD-10-CM | POA: Diagnosis present

## 2018-07-16 DIAGNOSIS — Z90710 Acquired absence of both cervix and uterus: Secondary | ICD-10-CM

## 2018-07-16 DIAGNOSIS — Z905 Acquired absence of kidney: Secondary | ICD-10-CM | POA: Diagnosis not present

## 2018-07-16 DIAGNOSIS — Z90721 Acquired absence of ovaries, unilateral: Secondary | ICD-10-CM | POA: Diagnosis not present

## 2018-07-16 DIAGNOSIS — M419 Scoliosis, unspecified: Secondary | ICD-10-CM | POA: Diagnosis not present

## 2018-07-16 DIAGNOSIS — M4326 Fusion of spine, lumbar region: Secondary | ICD-10-CM | POA: Diagnosis not present

## 2018-07-16 DIAGNOSIS — Z888 Allergy status to other drugs, medicaments and biological substances status: Secondary | ICD-10-CM

## 2018-07-16 DIAGNOSIS — M797 Fibromyalgia: Secondary | ICD-10-CM | POA: Diagnosis present

## 2018-07-16 DIAGNOSIS — Z881 Allergy status to other antibiotic agents status: Secondary | ICD-10-CM | POA: Diagnosis not present

## 2018-07-16 DIAGNOSIS — Z96612 Presence of left artificial shoulder joint: Secondary | ICD-10-CM | POA: Diagnosis present

## 2018-07-16 DIAGNOSIS — M48062 Spinal stenosis, lumbar region with neurogenic claudication: Principal | ICD-10-CM | POA: Diagnosis present

## 2018-07-16 DIAGNOSIS — D509 Iron deficiency anemia, unspecified: Secondary | ICD-10-CM | POA: Diagnosis present

## 2018-07-16 DIAGNOSIS — I872 Venous insufficiency (chronic) (peripheral): Secondary | ICD-10-CM | POA: Diagnosis present

## 2018-07-16 DIAGNOSIS — Z79890 Hormone replacement therapy (postmenopausal): Secondary | ICD-10-CM

## 2018-07-16 DIAGNOSIS — Z8543 Personal history of malignant neoplasm of ovary: Secondary | ICD-10-CM

## 2018-07-16 DIAGNOSIS — F1120 Opioid dependence, uncomplicated: Secondary | ICD-10-CM | POA: Diagnosis present

## 2018-07-16 DIAGNOSIS — M5416 Radiculopathy, lumbar region: Secondary | ICD-10-CM | POA: Diagnosis not present

## 2018-07-16 DIAGNOSIS — M4156 Other secondary scoliosis, lumbar region: Secondary | ICD-10-CM | POA: Diagnosis present

## 2018-07-16 DIAGNOSIS — Z85528 Personal history of other malignant neoplasm of kidney: Secondary | ICD-10-CM | POA: Diagnosis not present

## 2018-07-16 DIAGNOSIS — F418 Other specified anxiety disorders: Secondary | ICD-10-CM | POA: Diagnosis present

## 2018-07-16 DIAGNOSIS — F419 Anxiety disorder, unspecified: Secondary | ICD-10-CM | POA: Diagnosis present

## 2018-07-16 DIAGNOSIS — Z419 Encounter for procedure for purposes other than remedying health state, unspecified: Secondary | ICD-10-CM

## 2018-07-16 DIAGNOSIS — M48061 Spinal stenosis, lumbar region without neurogenic claudication: Secondary | ICD-10-CM | POA: Diagnosis not present

## 2018-07-16 DIAGNOSIS — Z87891 Personal history of nicotine dependence: Secondary | ICD-10-CM | POA: Diagnosis not present

## 2018-07-16 DIAGNOSIS — M960 Pseudarthrosis after fusion or arthrodesis: Secondary | ICD-10-CM | POA: Diagnosis not present

## 2018-07-16 DIAGNOSIS — Z96653 Presence of artificial knee joint, bilateral: Secondary | ICD-10-CM | POA: Diagnosis present

## 2018-07-16 DIAGNOSIS — Z85118 Personal history of other malignant neoplasm of bronchus and lung: Secondary | ICD-10-CM

## 2018-07-16 DIAGNOSIS — M47816 Spondylosis without myelopathy or radiculopathy, lumbar region: Secondary | ICD-10-CM | POA: Diagnosis present

## 2018-07-16 DIAGNOSIS — E039 Hypothyroidism, unspecified: Secondary | ICD-10-CM | POA: Diagnosis not present

## 2018-07-16 DIAGNOSIS — Z79899 Other long term (current) drug therapy: Secondary | ICD-10-CM

## 2018-07-16 HISTORY — PX: ANTERIOR LAT LUMBAR FUSION: SHX1168

## 2018-07-16 HISTORY — DX: Malignant neoplasm of unspecified part of unspecified bronchus or lung: C34.90

## 2018-07-16 LAB — SURGICAL PCR SCREEN
MRSA, PCR: NEGATIVE
Staphylococcus aureus: POSITIVE — AB

## 2018-07-16 LAB — BASIC METABOLIC PANEL
Anion gap: 11 (ref 5–15)
BUN: 31 mg/dL — ABNORMAL HIGH (ref 8–23)
CO2: 24 mmol/L (ref 22–32)
Calcium: 8.8 mg/dL — ABNORMAL LOW (ref 8.9–10.3)
Chloride: 106 mmol/L (ref 98–111)
Creatinine, Ser: 1.45 mg/dL — ABNORMAL HIGH (ref 0.44–1.00)
GFR calc Af Amer: 42 mL/min — ABNORMAL LOW (ref >60)
GFR calc non Af Amer: 36 mL/min — ABNORMAL LOW (ref >60)
Glucose, Bld: 94 mg/dL (ref 70–99)
Potassium: 3.7 mmol/L (ref 3.5–5.1)
Sodium: 141 mmol/L (ref 135–145)

## 2018-07-16 LAB — TYPE AND SCREEN
ABO/RH(D): AB POS
Antibody Screen: NEGATIVE

## 2018-07-16 LAB — CBC
HCT: 32.9 % — ABNORMAL LOW (ref 36.0–46.0)
Hemoglobin: 10.3 g/dL — ABNORMAL LOW (ref 12.0–15.0)
MCH: 29.6 pg (ref 26.0–34.0)
MCHC: 31.3 g/dL (ref 30.0–36.0)
MCV: 94.5 fL (ref 78.0–100.0)
Platelets: 151 10*3/uL (ref 150–400)
RBC: 3.48 MIL/uL — ABNORMAL LOW (ref 3.87–5.11)
RDW: 12.5 % (ref 11.5–15.5)
WBC: 6.7 10*3/uL (ref 4.0–10.5)

## 2018-07-16 SURGERY — ANTERIOR LATERAL LUMBAR FUSION 1 LEVEL
Anesthesia: General | Laterality: Left

## 2018-07-16 MED ORDER — ONDANSETRON HCL 4 MG/2ML IJ SOLN
INTRAMUSCULAR | Status: AC
  Filled 2018-07-16: qty 2

## 2018-07-16 MED ORDER — MORPHINE SULFATE (PF) 4 MG/ML IV SOLN
4.0000 mg | INTRAVENOUS | Status: DC | PRN
  Administered 2018-07-16 (×2): 4 mg via INTRAVENOUS
  Filled 2018-07-16 (×3): qty 1

## 2018-07-16 MED ORDER — METHOCARBAMOL 1000 MG/10ML IJ SOLN
500.0000 mg | Freq: Four times a day (QID) | INTRAVENOUS | Status: DC | PRN
  Filled 2018-07-16 (×2): qty 5

## 2018-07-16 MED ORDER — KETOROLAC TROMETHAMINE 15 MG/ML IJ SOLN
INTRAMUSCULAR | Status: AC
  Filled 2018-07-16: qty 1

## 2018-07-16 MED ORDER — SUCCINYLCHOLINE CHLORIDE 20 MG/ML IJ SOLN
INTRAMUSCULAR | Status: DC | PRN
  Administered 2018-07-16: 30 mg via INTRAVENOUS

## 2018-07-16 MED ORDER — PHENYLEPHRINE 40 MCG/ML (10ML) SYRINGE FOR IV PUSH (FOR BLOOD PRESSURE SUPPORT)
PREFILLED_SYRINGE | INTRAVENOUS | Status: DC | PRN
  Administered 2018-07-16: 80 ug via INTRAVENOUS
  Administered 2018-07-16: 120 ug via INTRAVENOUS

## 2018-07-16 MED ORDER — SODIUM CHLORIDE 0.9% FLUSH: 3.0000 mL | Freq: Two times a day (BID) | INTRAVENOUS | Status: DC

## 2018-07-16 MED ORDER — ACETAMINOPHEN 650 MG RE SUPP: 650.0000 mg | RECTAL | Status: DC | PRN

## 2018-07-16 MED ORDER — CHLORHEXIDINE GLUCONATE CLOTH 2 % EX PADS: 6.0000 | MEDICATED_PAD | Freq: Once | CUTANEOUS | Status: DC

## 2018-07-16 MED ORDER — HEMOSTATIC AGENTS (NO CHARGE) OPTIME
TOPICAL | Status: DC | PRN
  Administered 2018-07-16: 1 via TOPICAL

## 2018-07-16 MED ORDER — SODIUM CHLORIDE 0.9 % IV SOLN
INTRAVENOUS | Status: DC | PRN
  Administered 2018-07-16: 13:00:00 via INTRAVENOUS

## 2018-07-16 MED ORDER — LACTATED RINGERS IV SOLN
INTRAVENOUS | Status: DC
  Administered 2018-07-16: 17:00:00 via INTRAVENOUS

## 2018-07-16 MED ORDER — ONDANSETRON HCL 4 MG/2ML IJ SOLN: 4.0000 mg | Freq: Once | INTRAMUSCULAR | Status: DC | PRN

## 2018-07-16 MED ORDER — PROPOFOL 10 MG/ML IV BOLUS
INTRAVENOUS | Status: AC
  Filled 2018-07-16: qty 20

## 2018-07-16 MED ORDER — LEVOTHYROXINE SODIUM 100 MCG PO TABS
100.0000 ug | ORAL_TABLET | ORAL | Status: DC
  Administered 2018-07-17 – 2018-07-18 (×2): 100 ug via ORAL
  Filled 2018-07-16 (×2): qty 1

## 2018-07-16 MED ORDER — LIDOCAINE 2% (20 MG/ML) 5 ML SYRINGE
INTRAMUSCULAR | Status: DC | PRN
  Administered 2018-07-16: 60 mg via INTRAVENOUS

## 2018-07-16 MED ORDER — FENTANYL CITRATE (PF) 100 MCG/2ML IJ SOLN
INTRAMUSCULAR | Status: AC
  Filled 2018-07-16: qty 2

## 2018-07-16 MED ORDER — ONDANSETRON HCL 4 MG/2ML IJ SOLN
INTRAMUSCULAR | Status: DC | PRN
  Administered 2018-07-16: 4 mg via INTRAVENOUS

## 2018-07-16 MED ORDER — ZOLPIDEM TARTRATE 5 MG PO TABS
5.0000 mg | ORAL_TABLET | Freq: Every day | ORAL | Status: DC
  Administered 2018-07-16 – 2018-07-17 (×2): 5 mg via ORAL
  Filled 2018-07-16 (×2): qty 1

## 2018-07-16 MED ORDER — PHENOL 1.4 % MT LIQD: 1.0000 | OROMUCOSAL | Status: DC | PRN

## 2018-07-16 MED ORDER — FENTANYL CITRATE (PF) 250 MCG/5ML IJ SOLN
INTRAMUSCULAR | Status: DC | PRN
  Administered 2018-07-16: 50 ug via INTRAVENOUS
  Administered 2018-07-16: 100 ug via INTRAVENOUS
  Administered 2018-07-16: 50 ug via INTRAVENOUS

## 2018-07-16 MED ORDER — BUPROPION HCL ER (SR) 150 MG PO TB12
150.0000 mg | ORAL_TABLET | Freq: Every day | ORAL | Status: DC
  Administered 2018-07-17 – 2018-07-18 (×2): 150 mg via ORAL
  Filled 2018-07-16 (×2): qty 1

## 2018-07-16 MED ORDER — FENTANYL CITRATE (PF) 100 MCG/2ML IJ SOLN
25.0000 ug | INTRAMUSCULAR | Status: DC | PRN
  Administered 2018-07-16 (×3): 50 ug via INTRAVENOUS

## 2018-07-16 MED ORDER — ACETAMINOPHEN 325 MG PO TABS: 650.0000 mg | ORAL_TABLET | ORAL | Status: DC | PRN

## 2018-07-16 MED ORDER — ONDANSETRON HCL 4 MG PO TABS: 4.0000 mg | ORAL_TABLET | Freq: Four times a day (QID) | ORAL | Status: DC | PRN

## 2018-07-16 MED ORDER — DOCUSATE SODIUM 100 MG PO CAPS
100.0000 mg | ORAL_CAPSULE | Freq: Two times a day (BID) | ORAL | Status: DC
  Administered 2018-07-16 – 2018-07-18 (×4): 100 mg via ORAL
  Filled 2018-07-16 (×4): qty 1

## 2018-07-16 MED ORDER — SENNA 8.6 MG PO TABS
1.0000 | ORAL_TABLET | Freq: Two times a day (BID) | ORAL | Status: DC
  Administered 2018-07-16 – 2018-07-18 (×4): 8.6 mg via ORAL
  Filled 2018-07-16 (×4): qty 1

## 2018-07-16 MED ORDER — POLYETHYLENE GLYCOL 3350 17 G PO PACK: 17.0000 g | PACK | Freq: Every day | ORAL | Status: DC | PRN

## 2018-07-16 MED ORDER — MIDAZOLAM HCL 2 MG/2ML IJ SOLN
INTRAMUSCULAR | Status: DC | PRN
  Administered 2018-07-16: 2 mg via INTRAVENOUS

## 2018-07-16 MED ORDER — LIDOCAINE-EPINEPHRINE 1 %-1:100000 IJ SOLN
INTRAMUSCULAR | Status: DC | PRN
  Administered 2018-07-16: 10 mL

## 2018-07-16 MED ORDER — 0.9 % SODIUM CHLORIDE (POUR BTL) OPTIME
TOPICAL | Status: DC | PRN
  Administered 2018-07-16: 1000 mL

## 2018-07-16 MED ORDER — SODIUM CHLORIDE 0.9% FLUSH: 3.0000 mL | INTRAVENOUS | Status: DC | PRN

## 2018-07-16 MED ORDER — ALUM & MAG HYDROXIDE-SIMETH 200-200-20 MG/5ML PO SUSP
30.0000 mL | Freq: Four times a day (QID) | ORAL | Status: DC | PRN
  Filled 2018-07-16: qty 30

## 2018-07-16 MED ORDER — DEXAMETHASONE SODIUM PHOSPHATE 10 MG/ML IJ SOLN
INTRAMUSCULAR | Status: DC | PRN
  Administered 2018-07-16: 10 mg via INTRAVENOUS

## 2018-07-16 MED ORDER — BUPIVACAINE HCL (PF) 0.5 % IJ SOLN
INTRAMUSCULAR | Status: DC | PRN
  Administered 2018-07-16: 10 mL

## 2018-07-16 MED ORDER — KETOROLAC TROMETHAMINE 15 MG/ML IJ SOLN
15.0000 mg | Freq: Once | INTRAMUSCULAR | Status: AC
  Administered 2018-07-16: 15 mg via INTRAVENOUS

## 2018-07-16 MED ORDER — BUPIVACAINE HCL (PF) 0.5 % IJ SOLN
INTRAMUSCULAR | Status: AC
  Filled 2018-07-16: qty 30

## 2018-07-16 MED ORDER — LACTATED RINGERS IV SOLN
INTRAVENOUS | Status: DC
  Administered 2018-07-16: 10:00:00 via INTRAVENOUS

## 2018-07-16 MED ORDER — LIDOCAINE-EPINEPHRINE 1 %-1:100000 IJ SOLN
INTRAMUSCULAR | Status: AC
  Filled 2018-07-16: qty 1

## 2018-07-16 MED ORDER — ROCURONIUM BROMIDE 50 MG/5ML IV SOSY
PREFILLED_SYRINGE | INTRAVENOUS | Status: AC
  Filled 2018-07-16: qty 5

## 2018-07-16 MED ORDER — SODIUM CHLORIDE 0.9 % IV SOLN
INTRAVENOUS | Status: DC | PRN
  Administered 2018-07-16: 25 ug/min via INTRAVENOUS

## 2018-07-16 MED ORDER — MIDAZOLAM HCL 2 MG/2ML IJ SOLN
INTRAMUSCULAR | Status: AC
  Filled 2018-07-16: qty 2

## 2018-07-16 MED ORDER — OXYCODONE-ACETAMINOPHEN 5-325 MG PO TABS
1.0000 | ORAL_TABLET | ORAL | Status: DC | PRN
  Administered 2018-07-16 – 2018-07-17 (×4): 2 via ORAL
  Administered 2018-07-17: 1 via ORAL
  Administered 2018-07-17 – 2018-07-18 (×5): 2 via ORAL
  Filled 2018-07-16 (×10): qty 2

## 2018-07-16 MED ORDER — LIDOCAINE 2% (20 MG/ML) 5 ML SYRINGE
INTRAMUSCULAR | Status: AC
  Filled 2018-07-16: qty 5

## 2018-07-16 MED ORDER — TRIAMTERENE-HCTZ 75-50 MG PO TABS
1.0000 | ORAL_TABLET | Freq: Every day | ORAL | Status: DC
  Administered 2018-07-16 – 2018-07-18 (×3): 1 via ORAL
  Filled 2018-07-16 (×3): qty 1

## 2018-07-16 MED ORDER — DEXAMETHASONE SODIUM PHOSPHATE 10 MG/ML IJ SOLN
INTRAMUSCULAR | Status: AC
  Filled 2018-07-16: qty 1

## 2018-07-16 MED ORDER — PROPOFOL 10 MG/ML IV BOLUS
INTRAVENOUS | Status: DC | PRN
  Administered 2018-07-16: 150 mg via INTRAVENOUS

## 2018-07-16 MED ORDER — SODIUM CHLORIDE 0.9 % IV SOLN
INTRAVENOUS | Status: DC | PRN
  Administered 2018-07-16: 12:00:00 via INTRAVENOUS

## 2018-07-16 MED ORDER — ONDANSETRON HCL 4 MG/2ML IJ SOLN: 4.0000 mg | Freq: Four times a day (QID) | INTRAMUSCULAR | Status: DC | PRN

## 2018-07-16 MED ORDER — MENTHOL 3 MG MT LOZG: 1.0000 | LOZENGE | OROMUCOSAL | Status: DC | PRN

## 2018-07-16 MED ORDER — FENTANYL CITRATE (PF) 250 MCG/5ML IJ SOLN
INTRAMUSCULAR | Status: AC
  Filled 2018-07-16: qty 5

## 2018-07-16 MED ORDER — METHOCARBAMOL 500 MG PO TABS
ORAL_TABLET | ORAL | Status: AC
  Filled 2018-07-16: qty 1

## 2018-07-16 MED ORDER — SODIUM CHLORIDE 0.9 % IV SOLN: 250.0000 mL | INTRAVENOUS | Status: DC

## 2018-07-16 MED ORDER — FLEET ENEMA 7-19 GM/118ML RE ENEM: 1.0000 | ENEMA | Freq: Once | RECTAL | Status: DC | PRN

## 2018-07-16 MED ORDER — PROPOFOL 500 MG/50ML IV EMUL
INTRAVENOUS | Status: DC | PRN
  Administered 2018-07-16: 30 ug/kg/min via INTRAVENOUS

## 2018-07-16 MED ORDER — BISACODYL 10 MG RE SUPP: 10.0000 mg | Freq: Every day | RECTAL | Status: DC | PRN

## 2018-07-16 MED ORDER — MORPHINE SULFATE ER 30 MG PO TBCR
30.0000 mg | EXTENDED_RELEASE_TABLET | Freq: Three times a day (TID) | ORAL | Status: DC | PRN
  Administered 2018-07-16 – 2018-07-17 (×4): 30 mg via ORAL
  Filled 2018-07-16 (×5): qty 1

## 2018-07-16 MED ORDER — CEFAZOLIN SODIUM-DEXTROSE 2-4 GM/100ML-% IV SOLN
2.0000 g | Freq: Three times a day (TID) | INTRAVENOUS | Status: AC
  Administered 2018-07-16 (×2): 2 g via INTRAVENOUS
  Filled 2018-07-16 (×2): qty 100

## 2018-07-16 MED ORDER — METHOCARBAMOL 500 MG PO TABS
500.0000 mg | ORAL_TABLET | Freq: Four times a day (QID) | ORAL | Status: DC | PRN
  Administered 2018-07-16 – 2018-07-18 (×6): 500 mg via ORAL
  Filled 2018-07-16 (×6): qty 1

## 2018-07-16 SURGICAL SUPPLY — 45 items
BLADE CLIPPER SURG (BLADE) IMPLANT
BOLT PLATE XLIF 5.5X55 LRG (Bolt) ×2 IMPLANT
BOLT PLATE XLIF 5.5X55MM LRG (Bolt) ×1 IMPLANT
BONE MATRIX OSTEOCEL PRO MED (Bone Implant) ×3 IMPLANT
DERMABOND ADVANCED (GAUZE/BANDAGES/DRESSINGS) ×4
DERMABOND ADVANCED .7 DNX12 (GAUZE/BANDAGES/DRESSINGS) ×2 IMPLANT
DRAPE C-ARM 42X72 X-RAY (DRAPES) ×3 IMPLANT
DRAPE C-ARMOR (DRAPES) ×3 IMPLANT
DRAPE LAPAROTOMY 100X72X124 (DRAPES) ×3 IMPLANT
DRAPE POUCH INSTRU U-SHP 10X18 (DRAPES) ×3 IMPLANT
DURAPREP 26ML APPLICATOR (WOUND CARE) ×3 IMPLANT
ELECT REM PT RETURN 9FT ADLT (ELECTROSURGICAL) ×3
ELECTRODE REM PT RTRN 9FT ADLT (ELECTROSURGICAL) ×1 IMPLANT
GAUZE 4X4 16PLY RFD (DISPOSABLE) IMPLANT
GLOVE BIOGEL PI IND STRL 8.5 (GLOVE) ×1 IMPLANT
GLOVE BIOGEL PI INDICATOR 8.5 (GLOVE) ×2
GLOVE ECLIPSE 8.5 STRL (GLOVE) ×3 IMPLANT
GLOVE EXAM NITRILE LRG STRL (GLOVE) IMPLANT
GLOVE EXAM NITRILE XL STR (GLOVE) IMPLANT
GLOVE EXAM NITRILE XS STR PU (GLOVE) IMPLANT
GOWN STRL REUS W/ TWL LRG LVL3 (GOWN DISPOSABLE) ×1 IMPLANT
GOWN STRL REUS W/ TWL XL LVL3 (GOWN DISPOSABLE) ×1 IMPLANT
GOWN STRL REUS W/TWL 2XL LVL3 (GOWN DISPOSABLE) ×3 IMPLANT
GOWN STRL REUS W/TWL LRG LVL3 (GOWN DISPOSABLE) ×2
GOWN STRL REUS W/TWL XL LVL3 (GOWN DISPOSABLE) ×2
KIT BASIN OR (CUSTOM PROCEDURE TRAY) ×3 IMPLANT
KIT DILATOR XLIF 5 (KITS) ×2 IMPLANT
KIT SURGICAL ACCESS MAXCESS 4 (KITS) ×3 IMPLANT
KIT TURNOVER KIT B (KITS) ×3 IMPLANT
KIT XLIF (KITS) ×1
MODULE NVM5 NEXT GEN EMG (NEEDLE) ×3 IMPLANT
MODULUS XLW 12X22X55MM 10 (Spine Construct) ×3 IMPLANT
NEEDLE HYPO 25X1 1.5 SAFETY (NEEDLE) ×3 IMPLANT
NS IRRIG 1000ML POUR BTL (IV SOLUTION) ×3 IMPLANT
PACK LAMINECTOMY NEURO (CUSTOM PROCEDURE TRAY) ×3 IMPLANT
PLATE DECADE XLIP 2H SZ12 (Plate) ×3 IMPLANT
SCREW DECADE 5.5X50 (Screw) ×3 IMPLANT
SPONGE LAP 4X18 RFD (DISPOSABLE) IMPLANT
SURGIFLO W/THROMBIN 8M KIT (HEMOSTASIS) ×3 IMPLANT
SUT VIC AB 2-0 CP2 18 (SUTURE) ×3 IMPLANT
SUT VIC AB 3-0 SH 8-18 (SUTURE) ×3 IMPLANT
TOWEL GREEN STERILE (TOWEL DISPOSABLE) ×3 IMPLANT
TOWEL GREEN STERILE FF (TOWEL DISPOSABLE) ×3 IMPLANT
TRAY FOLEY MTR SLVR 16FR STAT (SET/KITS/TRAYS/PACK) ×3 IMPLANT
WATER STERILE IRR 1000ML POUR (IV SOLUTION) ×3 IMPLANT

## 2018-07-16 NOTE — Anesthesia Procedure Notes (Signed)
Procedure Name: Intubation Date/Time: 07/16/2018 11:49 AM Performed by: Bryson Corona, CRNA Pre-anesthesia Checklist: Patient identified, Emergency Drugs available, Suction available and Patient being monitored Patient Re-evaluated:Patient Re-evaluated prior to induction Oxygen Delivery Method: Circle System Utilized Preoxygenation: Pre-oxygenation with 100% oxygen Induction Type: IV induction Ventilation: Mask ventilation without difficulty Laryngoscope Size: Mac and 3 Grade View: Grade I Tube type: Oral Tube size: 7.0 mm Number of attempts: 1 Airway Equipment and Method: Stylet and Oral airway Placement Confirmation: ETT inserted through vocal cords under direct vision,  positive ETCO2 and breath sounds checked- equal and bilateral Secured at: 22 cm Tube secured with: Tape Dental Injury: Teeth and Oropharynx as per pre-operative assessment

## 2018-07-16 NOTE — Anesthesia Postprocedure Evaluation (Signed)
Anesthesia Post Note  Patient: [name]  Procedure(s) Performed: Left Lumbar two-three  Anterolateral decompression/interbody fusion with lateral plate fixation (Left )     Patient location during evaluation: PACU Anesthesia Type: General Level of consciousness: awake and alert Pain management: pain level controlled Vital Signs Assessment: post-procedure vital signs reviewed and stable Respiratory status: spontaneous breathing, nonlabored ventilation and respiratory function stable Cardiovascular status: blood pressure returned to baseline and stable Postop Assessment: no apparent nausea or vomiting Anesthetic complications: no    Last Vitals:  Vitals:   07/16/18 0904 07/16/18 1403  BP: (!) 131/55 (!) 151/82  Pulse: 83 79  Resp: 18 11  Temp: 36.8 C (!) 36.1 C  SpO2: 98% 100%    Last Pain:  Vitals:   07/16/18 0932  TempSrc:   PainSc: 9                  Izza Bickle,W. EDMOND

## 2018-07-16 NOTE — Op Note (Signed)
Date of surgery: 07/16/2018 Preoperative diagnosis: Lumbar stenosis L2-L3, history of fusion L3 to sacrum, lumbar radiculopathy, scoliotic deformity L2-L3 Postoperative diagnosis: Same Procedure: Anterolateral decompression L2-L3 with ex lift technique using 12 mm tall 22 mm long 55 mm wide titanium ex lift spacer filled with ostia cell lateral plate measuring 12 mm in height with 55 mm screws EMG monitoring Fluoroscopic guidance Surgeon: Kristeen Miss Anesthesia: General endotracheal Indications: Doing Babich is a 66 year old individuals had extensive lumbar spinal surgery for decompression and fusion.  She began to develop a deformity at the level of L2-L3 with severe stenosis on the right side particularly.  Having failed efforts at conservative management she was advised regarding surgery do an anterolateral decompression to reestablish height of the right side of the interspace and decompress the right exit foramina  Procedure: Patient was brought to the operating room supine on the stretcher.  After the smooth induction of general endotracheal anesthesia, she was placed in the right lateral decubitus position EMG monitoring was performed.  Orthogonal radiographs were obtained to identify the L2-L3 interspace once identified the skin was marked for lateral approach and the area was prepped with alcohol DuraPrep and draped in a sterile fashion.  The incision was opened and carried down to the fascia overlying the ribs on the 10th and 11th interspace.  Second incision was created posteriorly and inferiorly and the retroperitoneal space was identified.  Then by passing a small dilator through the fascia on the edges of the rib the dilator could be placed through the psoas muscle and to L2-L3.  EMG stimulation revealed no nerve roots in the vicinity.  A series of dilators were passed and a K wire was passed over into the disc space at L2-3.  A self-retaining retractor was placed deep in the wound and  retraction was performed in the superior and lateral directions.  The lateral aspect of the disc space was then identified and the fascial tissue was stripped the disc space was opened and entered with a #15 blade combination of curettes and rongeurs was used to evacuate a substantial quantity of severely degenerated disc material once this was performed a series of dilators was passed ultimately was felt that a 22 mm long 12 degree lordotic 12 mm tall spacer would fit into this interspace this was a titanium spacer was filled with ostia cell.  After good decortication of the endplates was performed the spacer was installed.  This allowed for good reduction of the acute scoliotic angulation that had been noted.  A 12 mm tall lateral plate with 55 mm screws was placed across L2 and L3 and this was secured in a neutral fashion with a dynamic compression component squeezing the endplates together.  With this hemostasis was checked in the soft tissues when it was verified the retractor was removed and the wound was closed with 2-0 Vicryl and fascia and 3-0 Vicryl subcuticularly.  Blood loss for the procedure was estimated at about 100 cc.

## 2018-07-16 NOTE — H&P (Signed)
[name] is an 67 y.o. female.   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 she has an arthrodesis from L3 to the sacrum.  She has developed adjacent level disease at L2-3 now with a degenerative scoliosis above her fusion and a Cobb angle of 15 degrees.  She has stenosis on the right worse than the left.  She presented with significant right lower extremity weakness in the iliopsoas graded at 4- out of 5.  She walks with severe antalgia.  Despite efforts at conservative management she has continued to have substantial pain and increasing immobility.  She is been advised regarding surgical decompression and stabilization at L2-3 using an anterolateral approach with lateral plate fixation.  Significantly, she has a fracture of the L1 vertebrae which has been treated with an acrylic balloon kyphoplasty.  Past Medical History:  Diagnosis Date  . Anemia   . Anxiety   . Arthritis    "everywhere" (08/19/2013)  . Bilateral carpal tunnel syndrome   . Chest pain at rest, rule out pericarditis 11/27/2012  . CHF (congestive heart failure) ([place])   . Chronic kidney disease    dr Risa Grill  . Chronic lower back pain   . Complication of anesthesia    "woke up twice during knee replacement" (08/19/2013)  . Depression   . Dyspnea    w/ exertion    . Fibromyalgia   . Headache(784.0)    hx of migraines   . Heart murmur   . History of pericarditis, with pericardial window in 2009 11/27/2012  . Hx of melanoma of skin 11/27/2012  . Hx of ovarian cancer 11/27/2012  . Hypothyroidism 11/27/2012  . Iron deficiency anemia   . Kidney carcinoma ([place])    "both kidneys; ~ 3 yr apart" (08/19/2013)  . Lung cancer ([place]) 2018  . Melanoma of back ([place])   . Migraines   . Pneumonia    "several times; including today", RUL/notes  08/19/2013  . SOB (shortness of breath)    occasional with exertion   . Squamous carcinoma    "face, side of my nose, chest; used acid to get rid of them" (08/19/2013)  . Venous insufficiency 11/27/2012    Past Surgical History:  Procedure Laterality Date  . ABDOMINAL HYSTERECTOMY  1979  . APPENDECTOMY    . DOPPLER ECHOCARDIOGRAPHY  02/23/2011   LVEF>50%  . FRACTURE SURGERY    . KNEE ARTHROSCOPY Right    "twice before the replacement" (08/19/2013)  . LEFT OOPHORECTOMY Left 1979  . [place]   "ruptured disc"  . MELANOMA EXCISION  ~ 2010   "my lower back" (08/19/2013)  . ORIF HIP FRACTURE Left    x 2 age 89 and 4  . PARTIAL NEPHRECTOMY Right 2005; ~ 2008  . PERICARDIAL WINDOW  ~ 2012  . POSTERIOR LUMBAR FUSION  2002?; 03/2012  . REDUCTION MAMMAPLASTY Bilateral ~ 2008  . RIGHT OOPHORECTOMY  ~ 2007   "it had cancer in it" (08/19/2013)  . SHOULDER ARTHROSCOPY W/ ROTATOR CUFF REPAIR Right 1990's  . TONSILLECTOMY  1954  . TOTAL KNEE ARTHROPLASTY Right 1990's  . TOTAL KNEE ARTHROPLASTY Left 08/02/2015   Procedure: LEFT TOTAL KNEE ARTHROPLASTY;  Surgeon: Paralee Cancel, MD;  Location: [facility];  Service: Orthopedics;  Laterality: Left;  . TOTAL SHOULDER REPLACEMENT Left 2006?  . TUBAL  LIGATION  1952  . VIDEO ASSISTED THORACOSCOPY (VATS)/WEDGE RESECTION Right 09/28/2017   Procedure: RIGHT VIDEO ASSISTED THORACOSCOPY (VATS)/WEDGE RESECTION, LYMPH NODE DISSECTION ;  Surgeon: Melrose Nakayama, MD;  Location: [facility];  Service: Thoracic;  Laterality: Right;    Family History  Problem Relation Age of Onset  . Cancer Mother   . Cancer Father   . Bipolar disorder Sister    Social History:  reports that she quit smoking about 32 years ago. Her smoking use included cigarettes. She has a 27.00 pack-year smoking history. She has never used smokeless tobacco. She reports that she does not drink alcohol or use drugs.  Allergies:  Allergies  Allergen Reactions  . Amoxicillin  Other (See Comments)    UNSPECIFIED REACTION  Has patient had a PCN reaction causing immediate rash, facial/tongue/throat swelling, SOB or lightheadedness with hypotension: No Has patient had a PCN reaction causing severe rash involving mucus membranes or skin necrosis: No Has patient had a PCN reaction that required hospitalization No Has patient had a PCN reaction occurring within the last 10 years: No If all of the above answers are "NO", then may proceed with Cephalosporin use.  . Ampicillin Rash and Other (See Comments)    Has patient had a PCN reaction causing immediate rash, facial/tongue/throat swelling, SOB or lightheadedness with hypotension: No Has patient had a PCN reaction causing severe rash involving mucus membranes or skin necrosis: No Has patient had a PCN reaction that required hospitalization No Has patient had a PCN reaction occurring within the last 10 years: No If all of the above answers are "NO", then may proceed with Cephalosporin use.  Ebbie Ridge [Buspirone] Nausea And Vomiting    Medications Prior to Admission  Medication Sig Dispense Refill  . buPROPion (WELLBUTRIN SR) 150 MG 12 hr tablet Take 1 tablet (150 mg total) by mouth 2 (two) times daily. (Patient taking differently: Take 150 mg by mouth daily. ) 60 tablet 4  . ibuprofen (ADVIL,MOTRIN) 200 MG tablet Take 400 mg by mouth every 6 (six) hours as needed for headache or moderate pain.    Marland Kitchen levothyroxine (SYNTHROID, LEVOTHROID) 100 MCG tablet TAKE 1 TABLET BY MOUTH BEFORE BREAKFAST MONDAY-FRIDAY SKIP SATURDAY ANS SUNDAY (Patient taking differently: Take 100 mcg by mouth See admin instructions. Take 100 mcg by mouth daily before breakfast Monday thru Friday. Skip Saturday and Sunday.) 30 tablet 3  . morphine (MS CONTIN) 30 MG 12 hr tablet Take 30 mg by mouth every 8 (eight) hours as needed for pain.     Marland Kitchen triamterene-hydrochlorothiazide (MAXZIDE) 75-50 MG tablet Take 1 tablet by mouth daily.    Marland Kitchen zolpidem (AMBIEN) 5  MG tablet TAKE 1 TABLET BY MOUTH AT BEDTIME AS NEEDED FOR SLEEP. (Patient taking differently: Take 5 mg by mouth at bedtime. ) 30 tablet 2  . cyclobenzaprine (FLEXERIL) 5 MG tablet Take 1 tablet (5 mg total) by mouth 3 (three) times daily as needed. For spasms (Patient not taking: Reported on 07/10/2018) 30 tablet 0  . polyethylene glycol (MIRALAX / GLYCOLAX) packet Take 17 g by mouth 2 (two) times daily. (Patient taking differently: Take 17 g by mouth daily as needed for moderate constipation. ) 14 each 0  . PROAIR HFA 108 (90 BASE) MCG/ACT inhaler INHALE 2 PUFFS EVERY 4 HOURS AS NEEDED FOR WHEEZING, COUGH OR SHORTNESS OF BREATH (Patient not taking: Reported on 07/10/2018) 8.5 g 3  . traMADol (ULTRAM) 50 MG tablet Take 1-2 tablets (50-100 mg total) by mouth every  6 (six) hours as needed (mild pain). (Patient not taking: Reported on 07/10/2018) 30 tablet 0    Results for orders placed or performed during the hospital encounter of 07/16/18 (from the past 48 hour(s))  Type and screen     Status: None   Collection Time: 07/16/18  9:25 AM  Result Value Ref Range   ABO/RH(D) AB POS    Antibody Screen NEG    Sample Expiration      07/19/2018 Performed at [facility], [place] [address], [place], [place] [address]   CBC     Status: Abnormal   Collection Time: 07/16/18  9:29 AM  Result Value Ref Range   WBC 6.7 4.0 - 10.5 K/uL   RBC 3.48 (L) 3.87 - 5.11 MIL/uL   Hemoglobin 10.3 (L) 12.0 - 15.0 g/dL   HCT 32.9 (L) 36.0 - 46.0 %   MCV 94.5 78.0 - 100.0 fL   MCH 29.6 26.0 - 34.0 pg   MCHC 31.3 30.0 - 36.0 g/dL   RDW 12.5 11.5 - 15.5 %   Platelets 151 150 - 400 K/uL    Comment: Performed at [facility], [place] [address], [place], [place] [address]  Basic metabolic panel     Status: Abnormal   Collection Time: 07/16/18  9:29 AM  Result Value Ref Range   Sodium 141 135 - 145 mmol/L   Potassium 3.7 3.5 - 5.1 mmol/L   Chloride 106 98 - 111 mmol/L   CO2 24 22 - 32 mmol/L   Glucose, Bld 94 70  - 99 mg/dL   BUN 31 (H) 8 - 23 mg/dL   Creatinine, Ser 1.45 (H) 0.44 - 1.00 mg/dL   Calcium 8.8 (L) 8.9 - 10.3 mg/dL   GFR calc non Af Amer 36 (L) >60 mL/min   GFR calc Af Amer 42 (L) >60 mL/min    Comment: (NOTE) The eGFR has been calculated using the CKD EPI equation. This calculation has not been validated in all clinical situations. eGFR's persistently <60 mL/min signify possible Chronic Kidney Disease.    Anion gap 11 5 - 15    Comment: Performed at [facility] [address] [place], [place] [address]   No results found.  Review of Systems  Constitutional: Negative.  Negative for weight loss.  HENT: Negative.   Eyes: Negative.   Respiratory: Negative.   Cardiovascular: Negative.   Gastrointestinal: Negative.   Genitourinary: Negative.   Musculoskeletal: Positive for back pain.  Skin: Negative.   Neurological: Positive for focal weakness and weakness.  Endo/Heme/Allergies: Negative.   Psychiatric/Behavioral: Negative.     Blood pressure (!) 131/55, pulse 83, temperature 98.3 F (36.8 C), temperature source Oral, resp. rate 18, height _0  (1.676 m), weight 69.4 kg, SpO2 98 %. Physical Exam  Constitutional: She is oriented to person, place, and time. She appears well-developed and well-nourished.  HENT:  Head: Normocephalic and atraumatic.  Eyes: Pupils are equal, round, and reactive to light. Conjunctivae and EOM are normal.  Neck: Normal range of motion. Neck supple.  Cardiovascular: Normal rate and regular rhythm.  Respiratory: Effort normal and breath sounds normal.  GI: Soft. Bowel sounds are normal.  Musculoskeletal: Normal range of motion.  Neurological: She is alert and oriented to person, place, and time.  Skin: Skin is warm and dry.  Psychiatric: She has a normal mood and affect. Her behavior is normal. Judgment and thought content normal.     Assessment/Plan Spondylosis and stenosis  L2-3 above previous fusion from L3 to sacrum with acute  degenerative scoliosis at the L2-3 level.  Plan: Anterolateral decompression L2-L3 with lateral plate fixation.  Earleen Newport, MD 07/16/2018, 11:32 AM

## 2018-07-16 NOTE — Anesthesia Preprocedure Evaluation (Addendum)
Anesthesia Evaluation  Patient identified by MRN, date of birth, ID band Patient awake    Reviewed: Allergy & Precautions, NPO status , Patient's Chart, lab work & pertinent test results  Airway Mallampati: II  TM Distance: >3 FB Neck ROM: Full    Dental  (+) Upper Dentures   Pulmonary former smoker,  H/o lung cancer   Pulmonary exam normal breath sounds clear to auscultation       Cardiovascular negative cardio ROS Normal cardiovascular exam Rhythm:Regular Rate:Normal  ECG: NSR, rate 79   Neuro/Psych  Headaches, Seizures -, Well Controlled,  PSYCHIATRIC DISORDERS Anxiety Depression    GI/Hepatic negative GI ROS, Neg liver ROS,   Endo/Other  Hypothyroidism   Renal/GU Renal disease     Musculoskeletal  (+) Fibromyalgia -, narcotic dependent  Abdominal   Peds  Hematology negative hematology ROS (+)   Anesthesia Other Findings Lumbar stenosis with neurogenic claudication  Reproductive/Obstetrics                            Anesthesia Physical Anesthesia Plan  ASA: III  Anesthesia Plan: General   Post-op Pain Management:    Induction: Intravenous  PONV Risk Score and Plan: 3 and Ondansetron, Dexamethasone, Midazolam and Treatment may vary due to age or medical condition  Airway Management Planned: Oral ETT  Additional Equipment:   Intra-op Plan:   Post-operative Plan: Extubation in OR  Informed Consent: I have reviewed the patients History and Physical, chart, labs and discussed the procedure including the risks, benefits and alternatives for the proposed anesthesia with the patient or authorized representative who has indicated his/her understanding and acceptance.   Dental advisory given  Plan Discussed with: CRNA  Anesthesia Plan Comments:         Anesthesia Quick Evaluation

## 2018-07-16 NOTE — Transfer of Care (Signed)
Immediate Anesthesia Transfer of Care Note  Patient: [name]  Procedure(s) Performed: Left Lumbar two-three  Anterolateral decompression/interbody fusion with lateral plate fixation (Left )  Patient Location: PACU  Anesthesia Type:General  Level of Consciousness: drowsy  Airway & Oxygen Therapy: Patient Spontanous Breathing and Patient connected to nasal cannula oxygen  Post-op Assessment: Report given to RN and Post -op Vital signs reviewed and stable  Post vital signs: Reviewed and stable  Last Vitals:  Vitals Value Taken Time  BP 151/82 07/16/2018  2:02 PM  Temp    Pulse 77 07/16/2018  2:05 PM  Resp 11 07/16/2018  2:05 PM  SpO2 99 % 07/16/2018  2:05 PM  Vitals shown include unvalidated device data.  Last Pain:  Vitals:   07/16/18 0932  TempSrc:   PainSc: 9          Complications: No apparent anesthesia complications

## 2018-07-16 NOTE — Progress Notes (Signed)
Patient ID: [name], female   DOB: 23-Feb-1951, 67 y.o.   MRN: [id number] Vital signs are stable Motor function appears doing fair however patient is having substantial back pain Incisions are clean and dry

## 2018-07-17 ENCOUNTER — Encounter (HOSPITAL_COMMUNITY): Payer: Self-pay | Admitting: Neurological Surgery

## 2018-07-17 NOTE — Evaluation (Signed)
Occupational Therapy Evaluation Patient Details Name: [name] MRN: [id number] DOB: 07-20-1951 Today's Date: 07/17/2018    History of Present Illness  Pt is a 67 y/o female now s/p L 2-3 anterolateral decompression/interbody fusion. Pt with PMHx including previous spinal surgery, bil TKA, depression, anxiety, CHF, fibromyalgia, Bilateral carpal tunnel syndrome   Clinical Impression   This 67 y/o female presents with the above. At baseline pt reports independence with ADLs and functional mobility. Pt demonstrating room level functional mobility without AD and overall minguard assist. Pt currently requires supervision for UB ADLs, min-modA for LB ADL secondary to adhering to back precautions. Pt reports she will return home with spouse who is able to assist with ADL tasks PRN. Educated pt regarding back precautions, AE, safety and compensatory techniques for completing ADLs and functional transfers while maintaining precautions with pt verbalizing/return demonstrating understanding. Pt requiring min cues to maintain back precautions during functional tasks this session. Will continue to follow while pt remains in acute setting to maximize her overall safety and independence with ADLs and functional mobility prior to discharge home.     Follow Up Recommendations  No OT follow up;Supervision/Assistance - 24 hour    Equipment Recommendations  None recommended by OT;Other (comment)(pt declining need for 3:1 )           Precautions / Restrictions Precautions Precautions: Back;Fall Precaution Booklet Issued: es (comment) Precaution Comments:verbally reviewed with pt, pt requiring verbal cues to recall 3/3 precautions  Required Braces or Orthoses: Spinal Brace Spinal Brace:Lumbar corset;Applied in sitting position Restrictions Weight Bearing Restrictions:  No      Mobility Bed Mobility Overal bed mobility: Needs Assistance Bed Mobility: Rolling;Sit to Sidelying Rolling:  Supervision       Sit to sidelying: Min guard General bed mobility comments: VCs for proper log roll technique as pt initially not utilizing; once cued pt able to perform without physical assist, minguard for safety  Transfers Overall transfer level: Needs assistance Equipment used: None Transfers: Sit to/from Stand Sit to Stand:Min guard         General transfer comment:  for safety upon initial standing    Balance Overall balance assessment: (P) Needs assistance Sitting-balance support: (P) Feet supported;No upper extremity supported Sitting balance-Leahy Scale: (P) Good     Standing balance support: (P) No upper extremity supported;During functional activity Standing balance-Leahy Scale: (P) Fair Standing balance comment: (P) pt initially seeking UE support on bed rail when ambulating around EOB                           ADL either performed or assessed with clinical judgement   ADL Overall ADL's : Needs assistance/impaired Eating/Feeding: Independent;Sitting   Grooming: Supervision/safety;Standing   Upper Body Bathing: Supervision/ safety;Sitting   Lower Body Bathing: Minimal assistance;Sit to/from stand Lower Body Bathing Details (indicate cue type and reason): educated on benefits of performing bathing task from sit<>stand level vs only standing with pt verbalizing understanding; educated on use of LH sponge to increase independence with ADL task  Upper Body Dressing : Min guard;Sitting Upper Body Dressing Details (indicate cue type and reason): pt doffing spinal brace seated EOB prior to returning to supine, able to verbalize proper technique for donning Lower Body Dressing: Minimal assistance;Sit to/from stand;With adaptive equipment;Moderate assistance Lower Body Dressing Details (indicate cue type and reason): reviewed use of reacher for LB dressing with pt return demonstrating; pt requires assist to access bil LEs without AE, reports spouse  can assist with these  needs as well  Toilet Transfer: Min guard;Ambulation;Regular Museum/gallery exhibitions officer and Hygiene: Min guard;Sit to/from stand       Functional mobility during ADLs: Min guard General ADL Comments: educated pt on safety, AE and compensatory techniques for completing ADLs while maintaining back precautions; pt mostly limited due to pain this session; min cues for maintaining back precautions during functional tasks                         Pertinent Vitals/Pain Pain Assessment: 0-10 Pain Score: 9  Pain Location: (P) back at incision site  Pain Descriptors / Indicators: (P) Grimacing;Guarding;Sore Pain Intervention(s): Monitored during session;Limited activity within patient's tolerance;Ice applied;Repositioned     Hand Dominance     Extremity/Trunk Assessment Upper Extremity Assessment Upper Extremity Assessment: Overall WFL for tasks assessed   Lower Extremity Assessment Lower Extremity Assessment: Defer to PT evaluation   Cervical / Trunk Assessment Cervical / Trunk Assessment: (P) Other exceptions Cervical / Trunk Exceptions: (P) s/p lumbar surgery    Communication Communication Communication: (P) No difficulties   Cognition Arousal/Alertness: (P) Awake/alert Behavior During Therapy: (P) WFL for tasks assessed/performed;Restless(somewhat restless due to pain) Overall Cognitive Status: (P) Within Functional Limits for tasks assessed                                     General Comments       Exercises     Shoulder Instructions      Home Living Family/patient expects to be discharged to:: (P) Private residence Living Arrangements: (P) Spouse/significant other Available Help at Discharge: (P) Family;Available 24 hours/day Type of Home: (P) House             Bathroom Shower/Tub: (P) Walk-in shower   Bathroom Toilet: (P) Standard     Home Equipment: (P) Cane - single point;Adaptive equipment Adaptive Equipment: (P)  Reacher        Prior Functioning/Environment Level of Independence: (P) Independent                 OT Problem List: Decreased strength;Pain;Impaired balance (sitting and/or standing);Decreased knowledge of precautions;Decreased knowledge of use of DME or AE;Decreased activity tolerance      OT Treatment/Interventions: Self-care/ADL training;DME and/or AE instruction;Therapeutic activities;Balance training;Therapeutic exercise;Patient/family education    OT Goals(Current goals can be found in the care plan section) Acute Rehab OT Goals Patient Stated Goal: (P) less pain, regain independence  OT Goal Formulation: With patient Time For Goal Achievement: 07/31/18 Potential to Achieve Goals: Good  OT Frequency: Min 2X/week   Barriers to D/C:            Co-evaluation              AM-PAC PT "6 Clicks" Daily Activity     Outcome Measure Help from another person eating meals?: None Help from another person taking care of personal grooming?: None Help from another person toileting, which includes using toliet, bedpan, or urinal?: A Little Help from another person bathing (including washing, rinsing, drying)?: A Little Help from another person to put on and taking off regular upper body clothing?: None Help from another person to put on and taking off regular lower body clothing?: A Little 6 Click Score: 21   End of Session Equipment Utilized During Treatment: Back brace Nurse Communication: Mobility status  Activity Tolerance: Patient tolerated  treatment well;Patient limited by pain Patient left: in bed;with call bell/phone within reach  OT Visit Diagnosis: Other abnormalities of gait and mobility (R26.89);Unsteadiness on feet (R26.81);Pain Pain - part of body: (back )                Time: 6387-5643 OT Time Calculation (min): 19 min Charges:  OT General Charges $OT Visit: 1 Visit OT Evaluation $OT Eval Low Complexity: 1 Low  Lou Cal, OT Supplemental  Rehabilitation Services Pager [phone] Office [phone]   Raymondo Band 07/17/2018, 9:41 AM

## 2018-07-17 NOTE — Progress Notes (Signed)
Patient ID: [name], female   DOB: 05/17/1951, 67 y.o.   MRN: [id number] Vital signs are stable Patient noting substantial pain She has been on MS Contin 40 mg up to 3 times a day We will add this today Continues to receive Percocet as needed for pain Plan discharge for the morning

## 2018-07-17 NOTE — Progress Notes (Signed)
Physical Therapy Evaluation Patient Details Name: [name] MRN: [id number] DOB: [birth year] Today's Date: 07/17/2018   History of Present Illness  Pt is a 67 y/o female now s/p L 2-3 anterolateral decompression/interbody fusion. Pt with PMHx including previous spinal surgery, bil TKA, depression, anxiety, CHF, fibromyalgia, Bilateral carpal tunnel syndrome.    Clinical Impression  Patient is s/p above surgery resulting in deficits listed below (see PT Problem List). At time of evaluation pt performed ambulation and stairs with gross min guard without use of AD. Pt educated on positioning, stair negotiation, car transfers, and overall safety with mobility at d/c. Pt reported this was her third spinal surgery and she was well aware of her precautions, but still required cueing throughout session for twisting. No AD recommending at d/c.  Acutely, patient will benefit from skilled PT to increase independence and safety with mobility to allow discharge to the venue listed below.       Follow Up Recommendations No PT follow up;Supervision for mobility/OOB    Equipment Recommendations  None recommended by PT    Recommendations for Other Services       Precautions / Restrictions Precautions Precautions: Back;Fall Precaution Booklet Issued: Yes (comment) Precaution Comments: Reviewed precautions in full with pt Required Braces or Orthoses: Spinal Brace Spinal Brace: Lumbar corset;Applied in sitting position Restrictions Weight Bearing Restrictions: No      Mobility  Bed Mobility Overal bed mobility: Needs Assistance Bed Mobility: Rolling;Sit to Sidelying Rolling: Supervision       Sit to sidelying: Min guard General bed mobility comments: Pt required cues for log roll sequence. Reports this is her third spinal surgery and was familiar with the log roll. HOB and bed rail lowered to simulate home environment. Increased time and effort noted. Pt donned brace in sitting.    Transfers Overall transfer level: Needs assistance Equipment used: None Transfers: Sit to/from Stand Sit to Stand: Min guard;Supervision         General transfer comment: Min guard secondary to pain progressing to supervision for safety   Ambulation/Gait Ambulation/Gait assistance: Min guard Gait Distance (Feet): 200 Feet Assistive device: None Gait Pattern/deviations: Step-through pattern;Trunk flexed;Decreased step length - right Gait velocity: decreased Gait velocity interpretation: <1.31 ft/sec, indicative of household ambulator General Gait Details: Pt slow and guarded throughout session. Min cues required for upright posture. Pt did not attempt to use rails or wall during session.   Stairs Stairs: Yes Stairs assistance: Min guard Stair Management: One rail Right;Step to pattern;Forwards Number of Stairs: 3 General stair comments: Pt ascended and descended with rail on the R. Pt has access to rail on left and right at home but only able to utilize one at a time due to stair width. Pt ascended with LLE leading and descended with RLE leading. Educated pt to utilize R rail at d/c when performing stairs as she feels RLE is weaker. Overall cues for safety.    Wheelchair Mobility    Modified Rankin (Stroke Patients Only)       Balance Overall balance assessment: Needs assistance Sitting-balance support: Feet supported;No upper extremity supported Sitting balance-Leahy Scale: Good     Standing balance support: No upper extremity supported;During functional activity Standing balance-Leahy Scale: Good Standing balance comment: No UE support required for dynamic or static standing actvities.                              Pertinent Vitals/Pain Pain Assessment: Faces Pain  Score: 9  Faces Pain Scale: Hurts even more Pain Location: back at incision site  Pain Descriptors / Indicators: Grimacing;Guarding;Sore Pain Intervention(s): Limited activity within  patient's tolerance;Monitored during session;Repositioned    Home Living Family/patient expects to be discharged to:: Private residence Living Arrangements: Spouse/significant other Available Help at Discharge: Family;Available 24 hours/day Type of Home: House Home Access: Stairs to enter Entrance Stairs-Rails: Psychiatric nurse of Steps: 3 Home Layout: One level Home Equipment: Cane - single point;Adaptive equipment      Prior Function Level of Independence: Independent               Hand Dominance        Extremity/Trunk Assessment   Upper Extremity Assessment Upper Extremity Assessment: Defer to OT evaluation    Lower Extremity Assessment Lower Extremity Assessment: RLE deficits/detail RLE Deficits / Details: pt reports weakness in RLE     Cervical / Trunk Assessment Cervical / Trunk Assessment: Other exceptions Cervical / Trunk Exceptions: s/p lumbar surgery   Communication   Communication: No difficulties  Cognition Arousal/Alertness: Awake/alert Behavior During Therapy: WFL for tasks assessed/performed;Restless Overall Cognitive Status: Within Functional Limits for tasks assessed                                        General Comments      Exercises     Assessment/Plan    PT Assessment Patient needs continued PT services  PT Problem List Decreased strength;Decreased range of motion;Decreased activity tolerance;Decreased balance;Decreased mobility;Decreased coordination;Decreased safety awareness;Decreased knowledge of precautions;Pain       PT Treatment Interventions Gait training;Stair training;Functional mobility training;Therapeutic activities;Therapeutic exercise;Balance training;Patient/family education;Modalities;Manual techniques    PT Goals (Current goals can be found in the Care Plan section)  Acute Rehab PT Goals Patient Stated Goal: less pain, regain independence  PT Goal Formulation: With patient Time  For Goal Achievement: 07/24/18 Potential to Achieve Goals: Good    Frequency Min 5X/week   Barriers to discharge        Co-evaluation               AM-PAC PT "6 Clicks" Daily Activity  Outcome Measure Difficulty turning over in bed (including adjusting bedclothes, sheets and blankets)?: A Little Difficulty moving from lying on back to sitting on the side of the bed? : A Little Difficulty sitting down on and standing up from a chair with arms (e.g., wheelchair, bedside commode, etc,.)?: A Little Help needed moving to and from a bed to chair (including a wheelchair)?: A Little Help needed walking in hospital room?: A Little Help needed climbing 3-5 steps with a railing? : A Little 6 Click Score: 18    End of Session Equipment Utilized During Treatment: Gait belt;Back brace Activity Tolerance: Patient tolerated treatment well Patient left: in chair;with call bell/phone within reach Nurse Communication: Mobility status PT Visit Diagnosis: Other abnormalities of gait and mobility (R26.89);Pain Pain - part of body: (incision on back)    Time: 5852-7782 PT Time Calculation (min) (ACUTE ONLY): 16 min   Charges:   PT Evaluation $PT Eval Moderate Complexity: 1 Mod PT Treatments $Gait Training: 8-22 mins        Einar Crow, [place]  Student Physical Therapist Acute Rehab [phone]   Einar Crow 07/17/2018, 11:01 AM

## 2018-07-18 MED ORDER — MORPHINE SULFATE ER 30 MG PO TBCR: 30.0000 mg | EXTENDED_RELEASE_TABLET | Freq: Two times a day (BID) | ORAL | 0 refills | Status: DC

## 2018-07-18 MED ORDER — OXYCODONE-ACETAMINOPHEN 5-325 MG PO TABS: 1.0000 | ORAL_TABLET | ORAL | 0 refills | Status: DC | PRN

## 2018-07-18 MED ORDER — METHOCARBAMOL 500 MG PO TABS: 500.0000 mg | ORAL_TABLET | Freq: Four times a day (QID) | ORAL | 3 refills | Status: DC | PRN

## 2018-07-18 MED ORDER — MORPHINE SULFATE (PF) 4 MG/ML IV SOLN
4.0000 mg | INTRAVENOUS | Status: DC | PRN
  Administered 2018-07-18: 4 mg via INTRAMUSCULAR

## 2018-07-18 NOTE — Progress Notes (Signed)
Patient is discharged from room 3C05 at this time. Alert and in stable condition. IV site d/c'd and instructions read to patient and son with understanding verbalized. Left unit via wheelchair with all belongings at side.

## 2018-07-18 NOTE — Progress Notes (Signed)
Occupational Therapy Treatment Patient Details Name: [name] MRN: [id number] DOB: 01-Feb-1951 Today's Date: 07/18/2018    History of present illness Pt is a 67 y/o female now s/p L 2-3 anterolateral decompression/interbody fusion. Pt with PMHx including previous spinal surgery, bil TKA, depression, anxiety, CHF, fibromyalgia, Bilateral carpal tunnel syndrome   OT comments  Pt progressing towards OT goals, presents supine in bed agreeable to treatment session. Pt completing functional mobility without AD and minguard assist throughout. Pt performing shower transfer and toileting during session with overall minguard assist, requires min cues for maintaining back precautions during functional tasks. Pt reports spouse and children will be available to assist with ADL needs after discharge home. Feel POC remains appropriate at this time. Will continue to follow acutely to progress pt towards established OT goals.   Follow Up Recommendations  No OT follow up;Supervision/Assistance - 24 hour    Equipment Recommendations  None recommended by OT          Precautions / Restrictions Precautions Precautions: Back;Fall Precaution Booklet Issued: Yes (comment) Precaution Comments: Reviewed precautions in full with pt Required Braces or Orthoses: Spinal Brace Spinal Brace: Lumbar corset;Applied in sitting position Restrictions Weight Bearing Restrictions: No       Mobility Bed Mobility Overal bed mobility: Needs Assistance Bed Mobility: Rolling;Sidelying to Sit Rolling: Supervision Sidelying to sit: Min guard       General bed mobility comments: required cues to initiate use of log roll technique; minguard for safety  Transfers Overall transfer level: Needs assistance Equipment used: None Transfers: Sit to/from Stand Sit to Stand: Min guard;Supervision         General transfer comment: for general safety, no physical assist required    Balance Overall balance assessment:  Needs assistance Sitting-balance support: Feet supported;No upper extremity supported Sitting balance-Leahy Scale: Good     Standing balance support: No upper extremity supported;During functional activity Standing balance-Leahy Scale: Good Standing balance comment: No UE support required for dynamic or static standing actvities.                            ADL either performed or assessed with clinical judgement   ADL Overall ADL's : Needs assistance/impaired                         Toilet Transfer: Min guard;Ambulation;Regular Museum/gallery exhibitions officer and Hygiene: Min guard;Sit to/from stand Toileting - Clothing Manipulation Details (indicate cue type and reason): pt performing clothing management with minguard for safety Tub/ Shower Transfer: Walk-in shower;Min guard;Ambulation   Functional mobility during ADLs: Min guard General ADL Comments: reviewed back precautions, general safety and compensatory techniques for completing ADLs and functional transfers; pt requiring minguard for mobility and min cues for adhering to precautions     Vision       Perception     Praxis      Cognition Arousal/Alertness: Awake/alert Behavior During Therapy: [facility] for tasks assessed/performed;Restless Overall Cognitive Status: Within Functional Limits for tasks assessed                                          Exercises     Shoulder Instructions       General Comments      Pertinent Vitals/ Pain       Pain Assessment:  Faces Faces Pain Scale: Hurts little more Pain Location: back at incision site  Pain Descriptors / Indicators: Grimacing;Guarding;Sore Pain Intervention(s): Monitored during session;Repositioned  Home Living                                          Prior Functioning/Environment              Frequency  Min 2X/week        Progress Toward Goals  OT Goals(current goals can now be  found in the care plan section)  Progress towards OT goals: Progressing toward goals  Acute Rehab OT Goals Patient Stated Goal: hopeful for home today OT Goal Formulation: With patient Time For Goal Achievement: 07/31/18 Potential to Achieve Goals: Good  Plan Discharge plan remains appropriate    Co-evaluation                 AM-PAC PT "6 Clicks" Daily Activity     Outcome Measure   Help from another person eating meals?: None Help from another person taking care of personal grooming?: None Help from another person toileting, which includes using toliet, bedpan, or urinal?: A Little Help from another person bathing (including washing, rinsing, drying)?: A Little Help from another person to put on and taking off regular upper body clothing?: None Help from another person to put on and taking off regular lower body clothing?: A Little 6 Click Score: 21    End of Session Equipment Utilized During Treatment: Gait belt;Back brace  OT Visit Diagnosis: Other abnormalities of gait and mobility (R26.89);Unsteadiness on feet (R26.81);Pain Pain - part of body: (back)   Activity Tolerance Patient tolerated treatment well   Patient Left in chair;with call bell/phone within reach   Nurse Communication Mobility status        Time: 1975-8832 OT Time Calculation (min): 14 min  Charges: OT General Charges $OT Visit: 1 Visit OT Treatments $Self Care/Home Management : 8-22 mins  Lou Cal, [place] Pager [phone] Office [phone]   Raymondo Band 07/18/2018, 9:57 AM

## 2018-07-18 NOTE — Discharge Instructions (Signed)
Wound Care Leave incision open to air. You may shower. Do not scrub directly on incision.  Do not put any creams, lotions, or ointments on incision. Activity Walk each and every day, increasing distance each day. No lifting greater than 5 lbs.  Avoid excessive neck motion. No driving for 2 weeks; may ride as a passenger locally. Diet Resume your normal diet.  Return to Work Will be discussed at you follow up appointment. Call Your Doctor If Any of These Occur Redness, drainage, or swelling at the wound.  Temperature greater than 101 degrees. Severe pain not relieved by pain medication. Increased difficulty swallowing. Incision starts to come apart. Follow Up Appt Call today for appointment in 2-3 weeks [phone]) or for problems.  If you have any hardware placed in your spine, you will need an x-ray before your appointment.

## 2018-07-18 NOTE — Progress Notes (Signed)
Physical Therapy Treatment Patient Details Name: [name] MRN: [id number] DOB: [birth year] Today's Date: 07/18/2018    History of Present Illness Pt is a 67 y/o female now s/p L 2-3 anterolateral decompression/interbody fusion. Pt with PMHx including previous spinal surgery, bil TKA, depression, anxiety, CHF, fibromyalgia, Bilateral carpal tunnel syndrome    PT Comments    Pt making steady progress towards goals. Pt performed transfers and ambulation with gross supervision to Mod I, without use of AD. Pt educated on brace adjustments, positioning, proper squat mechanics, and overall safety with mobility at d/c. Will continue to follow and progress as able, per POC.    Follow Up Recommendations  No PT follow up;Supervision for mobility/OOB     Equipment Recommendations  None recommended by PT    Recommendations for Other Services       Precautions / Restrictions Precautions Precautions: Back;Fall Precaution Booklet Issued: Yes (comment) Precaution Comments: Pt recalled 3/3 precautions. Continued to require cues for bending. Required Braces or Orthoses: Spinal Brace Spinal Brace: Lumbar corset;Applied in sitting position Restrictions Weight Bearing Restrictions: No    Mobility  Bed Mobility Overal bed mobility: Needs Assistance Bed Mobility: Rolling;Sidelying to Sit Rolling: Supervision Sidelying to sit: Min guard       General bed mobility comments: pt standing in bathroom at start of session  Transfers Overall transfer level: Modified independent Equipment used: None Transfers: Sit to/from Stand Sit to Stand: Modified independent (Device/Increase time)         General transfer comment: Mod I for increased time and effort, no physical assist required. Demonstrates safe hand placement prior to descent  Ambulation/Gait Ambulation/Gait assistance: Supervision;Modified independent (Device/Increase time) Gait Distance (Feet): 300 Feet Assistive device:  None Gait Pattern/deviations: Step-through pattern;Trunk flexed;Decreased step length - right Gait velocity: slightly decreased from baseline Gait velocity interpretation: <1.31 ft/sec, indicative of household ambulator General Gait Details: Pt demonstrating increased gait velocity and confidence with ambulation at today's session. Min cues for upright posture and keeping eyes forward. Pt required cues for mainenance of spinal precautions ambulating into room as she attempted to bend to pick up clothing instead of squat. Visually demonstrated safe squat mechanics.   Stairs         General stair comments: Pt reported she felt she could perform stairs safely at d/c and did not want to practice again   Wheelchair Mobility    Modified Rankin (Stroke Patients Only)       Balance Overall balance assessment: Needs assistance Sitting-balance support: Feet supported;No upper extremity supported Sitting balance-Leahy Scale: Good     Standing balance support: No upper extremity supported;During functional activity Standing balance-Leahy Scale: Good Standing balance comment: No UE support required for dynamic or static standing actvities.                             Cognition Arousal/Alertness: Awake/alert Behavior During Therapy: WFL for tasks assessed/performed Overall Cognitive Status: Within Functional Limits for tasks assessed                                        Exercises      General Comments        Pertinent Vitals/Pain Pain Assessment: Faces Faces Pain Scale: Hurts little more Pain Location: back at incision site  Pain Descriptors / Indicators: Grimacing;Guarding;Sore Pain Intervention(s): Limited activity within patient's tolerance;Monitored during  session;Repositioned    Home Living                      Prior Function            PT Goals (current goals can now be found in the care plan section) Acute Rehab PT  Goals Patient Stated Goal: hopeful for home today PT Goal Formulation: With patient Time For Goal Achievement: 07/24/18 Potential to Achieve Goals: Good Progress towards PT goals: Progressing toward goals    Frequency    Min 5X/week      PT Plan Current plan remains appropriate    Co-evaluation              AM-PAC PT "6 Clicks" Daily Activity  Outcome Measure  Difficulty turning over in bed (including adjusting bedclothes, sheets and blankets)?: A Little Difficulty moving from lying on back to sitting on the side of the bed? : A Little Difficulty sitting down on and standing up from a chair with arms (e.g., wheelchair, bedside commode, etc,.)?: A Little Help needed moving to and from a bed to chair (including a wheelchair)?: None Help needed walking in hospital room?: A Little Help needed climbing 3-5 steps with a railing? : A Little 6 Click Score: 19    End of Session Equipment Utilized During Treatment: Gait belt;Back brace Activity Tolerance: Patient tolerated treatment well Patient left: in chair;with call bell/phone within reach Nurse Communication: Mobility status PT Visit Diagnosis: Other abnormalities of gait and mobility (R26.89);Pain Pain - part of body: (incision on back)     Time: 0459-1368 PT Time Calculation (min) (ACUTE ONLY): 10 min  Charges:  $Gait Training: 8-22 mins                     Einar Crow, [place]  Student Physical Therapist Acute Rehab [phone]    Einar Crow 07/18/2018, 1:06 PM

## 2018-07-18 NOTE — Discharge Summary (Signed)
Physician Discharge Summary  Patient ID: [name] MRN: [id number] DOB/AGE: 1951/02/18 67 y.o.  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  She tolerated surgery well.  Consults: None  Significant Diagnostic Studies: None  Treatments: Anterolateral decompression L2-3 with lateral plate fixation arthrodesis with allograft  Discharge Exam: Blood pressure 137/66, pulse 78, temperature 98.2 F (36.8 C), temperature source Oral, resp. rate 16, height 5\' 6"  (1.676 m), weight 69.4 kg, SpO2 99 %. Incision is clean and dry ambulation is intact  Disposition: Discharge disposition: 01-Home or Self Care       Discharge Instructions    Call MD for:  redness, tenderness, or signs of infection (pain, swelling, redness, odor or green/yellow discharge around incision site)   Complete by:  As directed    Call MD for:  severe uncontrolled pain   Complete by:  As directed    Call MD for:  temperature >100.4   Complete by:  As directed    Diet - low sodium heart healthy   Complete by:  As directed    Discharge instructions   Complete by:  As directed    Okay to shower. Do not apply salves or appointments to incision. No heavy lifting with the upper extremities greater than 15 pounds. May resume driving when not requiring pain medication and patient feels comfortable with doing so.   Incentive spirometry RT   Complete by:  As directed    Increase activity slowly   Complete by:  As directed      Allergies as of 07/18/2018      Reactions   Amoxicillin Other (See  Comments)   UNSPECIFIED REACTION  Has patient had a PCN reaction causing immediate rash, facial/tongue/throat swelling, SOB or lightheadedness with hypotension: No Has patient had a PCN reaction causing severe rash involving mucus membranes or skin necrosis: No Has patient had a PCN reaction that required hospitalization No Has patient had a PCN reaction occurring within the last 10 years: No If all of the above answers are "NO", then may proceed with Cephalosporin use.   Ampicillin Rash, Other (See Comments)   Has patient had a PCN reaction causing immediate rash, facial/tongue/throat swelling, SOB or lightheadedness with hypotension: No Has patient had a PCN reaction causing severe rash involving mucus membranes or skin necrosis: No Has patient had a PCN reaction that required hospitalization No Has patient had a PCN reaction occurring within the last 10 years: No If all of the above answers are "NO", then may proceed with Cephalosporin use.   Buspar [buspirone] Nausea And Vomiting      Medication List    TAKE these medications   buPROPion 150 MG 12 hr tablet Commonly known as:  WELLBUTRIN SR Take 1 tablet (150 mg total) by mouth 2 (two) times daily. What changed:  when to take this   cyclobenzaprine 5 MG tablet Commonly known as:  FLEXERIL Take 1 tablet (5 mg total) by mouth 3 (three) times daily as needed. For spasms   ibuprofen 200 MG tablet Commonly known as:  ADVIL,MOTRIN Take 400 mg by mouth every 6 (six) hours as needed for headache or moderate pain.  levothyroxine 100 MCG tablet Commonly known as:  SYNTHROID, LEVOTHROID TAKE 1 TABLET BY MOUTH BEFORE BREAKFAST MONDAY-FRIDAY SKIP SATURDAY ANS SUNDAY What changed:    how much to take  how to take this  when to take this  additional instructions   methocarbamol 500 MG tablet Commonly known as:  ROBAXIN Take 1 tablet (500 mg total) by mouth every 6 (six) hours as needed for muscle spasms.   morphine 30 MG 12 hr  tablet Commonly known as:  MS CONTIN Take 1 tablet (30 mg total) by mouth every 12 (twelve) hours. What changed:    when to take this  reasons to take this   oxyCODONE-acetaminophen 5-325 MG tablet Commonly known as:  PERCOCET/ROXICET Take 1-2 tablets by mouth every 3 (three) hours as needed for severe pain (And breakthrough pain).   polyethylene glycol packet Commonly known as:  MIRALAX / GLYCOLAX Take 17 g by mouth 2 (two) times daily. What changed:    when to take this  reasons to take this   PROAIR HFA 108 (90 Base) MCG/ACT inhaler Generic drug:  albuterol INHALE 2 PUFFS EVERY 4 HOURS AS NEEDED FOR WHEEZING, COUGH OR SHORTNESS OF BREATH   traMADol 50 MG tablet Commonly known as:  ULTRAM Take 1-2 tablets (50-100 mg total) by mouth every 6 (six) hours as needed (mild pain).   triamterene-hydrochlorothiazide 75-50 MG tablet Commonly known as:  MAXZIDE Take 1 tablet by mouth daily.   zolpidem 5 MG tablet Commonly known as:  AMBIEN TAKE 1 TABLET BY MOUTH AT BEDTIME AS NEEDED FOR SLEEP. What changed:    when to take this  additional instructions        Signed: Earleen Newport 07/18/2018, 9:33 AM

## 2018-07-19 ENCOUNTER — Ambulatory Visit: Payer: Self-pay | Admitting: Family Medicine

## 2018-07-19 ENCOUNTER — Encounter: Payer: Self-pay | Admitting: Family Medicine

## 2018-07-19 NOTE — Telephone Encounter (Signed)
I sent a mychart message but want to ensure the patient gets the message   Please notify her that she should have more urine output. In the message it said she only urinated once for the day. If she still has only a little urine or any blood in it then she needs to go to Urgent Care or call for a same day appointment.  Go to the ER if there are symptoms of fevers or chills.

## 2018-07-19 NOTE — Telephone Encounter (Signed)
She called in because her daughter said she needed to.    She had back surgery Sept 24th.   She had a catheter in during the surgery.   This morning when she urinated (only time she's urinated today) she noticed a little blood in her urine and had burning.  She wants to wait and see if she experiences blood in her urine again.   "I only called because my daughter said I should".    Due to her recent back surgery she is not to leave the house for 2 weeks.     I let her know I would route a note to Dr. Nolon Rod letting her know your symptoms and situation.   She wants to wait and see if she has blood in urine again.   I will still route note to Dr. Nolon Rod.  Triage notes routed to Dr. Nolon Rod.   Reason for Disposition . Blood in urine  (Exception: could be normal menstrual bleeding)  Answer Assessment - Initial Assessment Questions 1. COLOR of URINE: "Describe the color of the urine."  (e.g., tea-colored, pink, red, blood clots, bloody)     Had back surgery on Sept 24th.   I got up this morning and I had burning with urination.   I had a catheter in with the surgery.   2. ONSET: "When did the bleeding start?"      This morning 3. EPISODES: "How many times has there been blood in the urine?" or "How many times today?"     I've only urinated once this morning.    4. PAIN with URINATION: "Is there any pain with passing your urine?" If so, ask: "How bad is the pain?"  (Scale 1-10; or mild, moderate, severe)    - MILD - complains slightly about urination hurting    - MODERATE - interferes with normal activities      - SEVERE - excruciating, unwilling or unable to urinate because of the pain      No abd pain. 5. FEVER: "Do you have a fever?" If so, ask: "What is your temperature, how was it measured, and when did it start?"     No 6. ASSOCIATED SYMPTOMS: "Are you passing urine more frequently than usual?"     No.   7. OTHER SYMPTOMS: "Do you have any other symptoms?" (e.g., back/flank  pain, abdominal pain, vomiting)     No other symptoms. 8. PREGNANCY: "Is there any chance you are pregnant?" "When was your last menstrual period?"     Not asked due to age  Protocols used: URINE - BLOOD IN-A-AH

## 2018-07-19 NOTE — Telephone Encounter (Signed)
Please see note below. 

## 2018-07-24 ENCOUNTER — Ambulatory Visit: Payer: Medicare Other | Admitting: Family Medicine

## 2018-08-02 DIAGNOSIS — N390 Urinary tract infection, site not specified: Secondary | ICD-10-CM | POA: Diagnosis not present

## 2018-08-02 DIAGNOSIS — R82998 Other abnormal findings in urine: Secondary | ICD-10-CM | POA: Diagnosis not present

## 2018-08-06 ENCOUNTER — Ambulatory Visit: Payer: Medicare Other | Admitting: Family Medicine

## 2018-08-07 DIAGNOSIS — N390 Urinary tract infection, site not specified: Secondary | ICD-10-CM | POA: Diagnosis not present

## 2018-08-14 DIAGNOSIS — M48062 Spinal stenosis, lumbar region with neurogenic claudication: Secondary | ICD-10-CM | POA: Diagnosis not present

## 2018-08-15 DIAGNOSIS — D649 Anemia, unspecified: Secondary | ICD-10-CM | POA: Diagnosis not present

## 2018-08-15 DIAGNOSIS — R6889 Other general symptoms and signs: Secondary | ICD-10-CM | POA: Diagnosis not present

## 2018-08-15 DIAGNOSIS — R5383 Other fatigue: Secondary | ICD-10-CM | POA: Diagnosis not present

## 2018-08-15 DIAGNOSIS — E559 Vitamin D deficiency, unspecified: Secondary | ICD-10-CM | POA: Diagnosis not present

## 2018-08-15 DIAGNOSIS — N390 Urinary tract infection, site not specified: Secondary | ICD-10-CM | POA: Diagnosis not present

## 2018-08-17 ENCOUNTER — Ambulatory Visit: Payer: Medicare Other | Admitting: Family Medicine

## 2018-08-21 DIAGNOSIS — N390 Urinary tract infection, site not specified: Secondary | ICD-10-CM | POA: Diagnosis not present

## 2018-08-21 DIAGNOSIS — R102 Pelvic and perineal pain: Secondary | ICD-10-CM | POA: Diagnosis not present

## 2018-08-22 ENCOUNTER — Ambulatory Visit: Payer: Self-pay | Admitting: *Deleted

## 2018-08-23 ENCOUNTER — Ambulatory Visit: Payer: Medicare Other | Admitting: Family Medicine

## 2018-08-23 ENCOUNTER — Ambulatory Visit: Payer: Self-pay | Admitting: *Deleted

## 2018-08-23 NOTE — Telephone Encounter (Signed)
Called in c/o having shortness of breath with moving around (exertion).   She first noticed it last Saturday while walking across a parking lot. She has a history of lung cancer.   She is not in distress.   Talking in complete sentences and without difficulty.  No coughing or fever or congestion.  See triage notes below.  The protocol is for within 4 hours however she is not in distress so I felt comfortable with her seeing her PCP today, Dr. Nolon Rod at 4:20 with the understanding she is to go to the ED if her symptoms become worse or she starts having chest pain or the pain in her back becomes worse.   She verbalized understanding and was agreeable to this plan.    Reason for Disposition . [1] MILD difficulty breathing (e.g., minimal/no SOB at rest, SOB with walking, pulse <100) AND [2] NEW-onset or WORSE than normal  Answer Assessment - Initial Assessment Questions 1. RESPIRATORY STATUS: "Describe your breathing?" (e.g., wheezing, shortness of breath, unable to speak, severe coughing)      I have shortness of breath with exertion.   I had lung cancer.   My back is hurting real bad in upper back between shoulder  Blades.   It only hurts when I breath deep.    I just got over a UTI from a catheter from being in the antibiotic.    2. ONSET: "When did this breathing problem begin?"      Last Saturday I noticed I was out of breath with exertion. 3. PATTERN "Does the difficult breathing come and go, or has it been constant since it started?"      It's constant with moving around. 4. SEVERITY: "How bad is your breathing?" (e.g., mild, moderate, severe)    - MILD: No SOB at rest, mild SOB with walking, speaks normally in sentences, can lay down, no retractions, pulse < 100.    - MODERATE: SOB at rest, SOB with minimal exertion and prefers to sit, cannot lie down flat, speaks in phrases, mild retractions, audible wheezing, pulse 100-120.    - SEVERE: Very SOB at rest, speaks in single words,  struggling to breathe, sitting hunched forward, retractions, pulse > 120      I get real short of breath when I'm up moving around. 5. RECURRENT SYMPTOM: "Have you had difficulty breathing before?" If so, ask: "When was the last time?" and "What happened that time?"      Only was with pneumonia years ago.    No coughing or congestion. 6. CARDIAC HISTORY: "Do you have any history of heart disease?" (e.g., heart attack, angina, bypass surgery, angioplasty)      I had CHF several years ago they did a "window" and I've not had any more problems.   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   I've been through a lot. 7. LUNG HISTORY: "Do you have any history of lung disease?"  (e.g., pulmonary embolus, asthma, emphysema)     Yes.   Lung cancer. 8. CAUSE: "What do you think is causing the breathing problem?"      Feels like pneumonia. 9. OTHER SYMPTOMS: "Do you have any other symptoms? (e.g., dizziness, runny nose, cough, chest pain, fever)     No fever or chest pain.   Just the pain between my shoulder blades with breathing.   I'm feeling so tired and weak. 10. PREGNANCY: "Is there any chance you are pregnant?" "When was your last menstrual period?"  Not asked due to age. 11. TRAVEL: "Have you traveled out of the country in the last month?" (e.g., travel history, exposures)       I was at the hospital last week Saturday in the ED with my husband.   He had a blocked vein in his leg.     Maybe from the hospital.  Protocols used: BREATHING DIFFICULTY-A-AH

## 2018-08-24 ENCOUNTER — Ambulatory Visit: Payer: Medicare Other | Admitting: Family Medicine

## 2018-08-26 ENCOUNTER — Ambulatory Visit: Payer: Self-pay

## 2018-08-26 NOTE — Telephone Encounter (Signed)
Pt. States she cancelled her appointment for this past Friday and she was a no show on Saturday. C/o weakness. Has an appointment for 09/26/18. Would like to be seen sooner. Only wants to see Dr. Nolon Rod.  Will put on cancellation list.  Reason for Disposition . [1] MODERATE weakness (i.e., interferes with work, school, normal activities) AND [2] persists > 3 days  Answer Assessment - Initial Assessment Questions 1. DESCRIPTION: "Describe how you are feeling."     Weak 2. SEVERITY: "How bad is it?"  "Can you stand and walk?"   - MILD - Feels weak or tired, but does not interfere with work, school or normal activities   - [place] to stand and walk; weakness interferes with work, school, or normal activities   - SEVERE - Unable to stand or walk     Mild 3. ONSET:  "When did the weakness begin?"     3 weeks ago 4. CAUSE: "What do you think is causing the weakness?"     Unsure 5. MEDICINES: "Have you recently started a new medicine or had a change in the amount of a medicine?"     No 6. OTHER SYMPTOMS: "Do you have any other symptoms?" (e.g., chest pain, fever, cough, SOB, vomiting, diarrhea, bleeding, other areas of pain)     Short of breath 7. PREGNANCY: "Is there any chance you are pregnant?" "When was your last menstrual period?"     No  Protocols used: WEAKNESS (GENERALIZED) AND FATIGUE-A-AH

## 2018-08-27 DIAGNOSIS — R82998 Other abnormal findings in urine: Secondary | ICD-10-CM | POA: Diagnosis not present

## 2018-08-27 DIAGNOSIS — R5383 Other fatigue: Secondary | ICD-10-CM | POA: Diagnosis not present

## 2018-08-27 DIAGNOSIS — R300 Dysuria: Secondary | ICD-10-CM | POA: Diagnosis not present

## 2018-08-28 ENCOUNTER — Encounter: Payer: Self-pay | Admitting: Family Medicine

## 2018-08-28 ENCOUNTER — Ambulatory Visit (INDEPENDENT_AMBULATORY_CARE_PROVIDER_SITE_OTHER): Payer: Medicare Other

## 2018-08-28 ENCOUNTER — Ambulatory Visit (INDEPENDENT_AMBULATORY_CARE_PROVIDER_SITE_OTHER): Payer: Medicare Other | Admitting: Family Medicine

## 2018-08-28 ENCOUNTER — Other Ambulatory Visit: Payer: Self-pay

## 2018-08-28 VITALS — BP 137/81 | HR 106 | Temp 98.2°F | Resp 16 | Ht 66.0 in | Wt 149.8 lb

## 2018-08-28 DIAGNOSIS — R0609 Other forms of dyspnea: Secondary | ICD-10-CM | POA: Diagnosis not present

## 2018-08-28 DIAGNOSIS — Z8679 Personal history of other diseases of the circulatory system: Secondary | ICD-10-CM | POA: Diagnosis not present

## 2018-08-28 DIAGNOSIS — R5383 Other fatigue: Secondary | ICD-10-CM | POA: Diagnosis not present

## 2018-08-28 DIAGNOSIS — R000 Tachycardia, unspecified: Secondary | ICD-10-CM

## 2018-08-28 LAB — POCT CBC
Granulocyte percent: 69.2 %G (ref 37–80)
HCT, POC: 34.5 % (ref 29–41)
Hemoglobin: 11.6 g/dL (ref 9.5–13.5)
Lymph, poc: 2.4 (ref 0.6–3.4)
MCH, POC: 29.6 pg (ref 27–31.2)
MCHC: 33.7 g/dL (ref 31.8–35.4)
MCV: 87.9 fL (ref 76–111)
MID (cbc): 0.5 (ref 0–0.9)
MPV: 7.7 fL (ref 0–99.8)
POC Granulocyte: 6.4 (ref 2–6.9)
POC LYMPH PERCENT: 25.9 %L (ref 10–50)
POC MID %: 4.9 %M (ref 0–12)
Platelet Count, POC: 283 10*3/uL (ref 142–424)
RBC: 3.92 M/uL — AB (ref 4.04–5.48)
RDW, POC: 13.2 %
WBC: 9.3 10*3/uL (ref 4.6–10.2)

## 2018-08-28 NOTE — Patient Instructions (Addendum)
With your current symptoms, further information and testing will need to be done through the emergency room. Please go there as soon as you leave the office. If any acute worsening of symptoms on the way there, pull over and call 911.   Please follow up with pain management and primary care provider to discuss other aches/pains. Return to the clinic or go to the nearest emergency room if any of your symptoms worsen or new symptoms occur.   Shortness of Breath, Adult Shortness of breath is when a person has trouble breathing enough air, or when a person feels like she or he is having trouble breathing in enough air. Shortness of breath could be a sign of medical problem. Follow these instructions at home: Pay attention to any changes in your symptoms. Take these actions to help with your condition:  Do not smoke. Smoking is a common cause of shortness of breath. If you smoke and you need help quitting, ask your health care provider.  Avoid things that can irritate your airways, such as: ? Mold. ? Dust. ? Air pollution. ? Chemical fumes. ? Things that can cause allergy symptoms (allergens), if you have allergies.  Keep your living space clean and free of mold and dust.  Rest as needed. Slowly return to your usual activities.  Take over-the-counter and prescription medicines, including oxygen and inhaled medicines, only as told by your health care provider.  Keep all follow-up visits as told by your health care provider. This is important.  Contact a health care provider if:  Your condition does not improve as soon as expected.  You have a hard time doing your normal activities, even after you rest.  You have new symptoms. Get help right away if:  Your shortness of breath gets worse.  You have shortness of breath when you are resting.  You feel light-headed or you faint.  You have a cough that is not controlled with medicines.  You cough up blood.  You have pain with  breathing.  You have pain in your chest, arms, shoulders, or abdomen.  You have a fever.  You cannot walk up stairs or exercise the way that you normally do. This information is not intended to replace advice given to you by your health care provider. Make sure you discuss any questions you have with your health care provider. Document Released: 07/04/2001 Document Revised: 04/29/2016 Document Reviewed: 03/16/2016 Elsevier Interactive Patient Education  [employer].   If you have lab work done today you will be contacted with your lab results within the next 2 weeks.  If you have not heard from [place] then please contact us. The fastest way to get your results is to register for My Chart.   IF you received an x-ray today, you will receive an invoice from [facility]. Please contact [facility] at [phone] with questions or concerns regarding your invoice.   IF you received labwork today, you will receive an invoice from [facility]. Please contact [facility] at [phone] with questions or concerns regarding your invoice.   Our billing staff will not be able to assist you with questions regarding bills from these companies.  You will be contacted with the lab results as soon as they are available. The fastest way to get your results is to activate your My Chart account. Instructions are located on the last page of this paperwork. If you have not heard from [place] regarding the results in 2 weeks, please contact this office.

## 2018-08-28 NOTE — Progress Notes (Signed)
Subjective:  By signing my name below, I, Moises Blood, attest that this documentation has been prepared under the direction and in the presence of Merri Ray, MD. Electronically Signed: Moises Blood, [place]. 08/28/2018 , 11:14 AM .  Patient was seen in Room 10 .   Patient ID: [name], female    DOB: September 29, 1951, 67 y.o.   MRN: [id number] Chief Complaint  Patient presents with  . Shoulder Pain    pain in both shoulder  . Foot Pain     both side  . Depression screening    PHQ9 =15   HPI [name] is a 67 y.o. female  Here for multiple concerns today including bilateral shoulder pain, bilateral foot pain, and positive depression screening. Additionally, nurse triage note on Nov 1st reportedly having shortness of breath with exertion. Plan for her to see her PCP Nolon Rod, Arlie Solomons, MD) on Nov 1st at 4:20 PM, but she no-showed that appointment. She also had an appointment on Nov 2nd, which she no-showed. She called again on Nov 4th after the 2 no-show appointments, and was complaining of weakness. She requested to see Dr. Nolon Rod only but did not have an appointment available until Dec 5th. Patient requested an earlier appointment. last office visit with Dr. Nolon Rod was in May. Plan at that time was to follow up with psychiatry regarding treatment of anxiety; referred to [facility] in August.   She has a history of lung cancer with resection of right upper lobe in Dec 2018, oncologist is Dr. Julien Nordmann. Last seen in July with plan for 6 month follow up. It appears she's also followed by pain management for chronic pain syndrome. She's treated with MS Contin 30 mg tid. Plan for weaning morphine but appears she may follow up with PCP. Her cardiologist is Dr. Einar Gip; last echocardiogram was done 2 years ago.   Per problem list, she has a history of pericarditis in 2009, recurrent episodes in 2011 and 2013. She was having shortness of breath at that time. She notes  she was heavy weight at that time, She did require a pericardial window.   Patient states she had back surgery done in [facility] hospital in Sept. After that, she had bladder infection and has been followed by OBGYN, going back and forth over the span of 2-3 weeks. Since the bladder infection starting, she's been having shortness of breath with exertion. When she lays down, she feels a little better. Her shortness of breath has improved since that time. Although, she's been feeling overall weak, and noticed weight loss over the past 3 weeks. She's been taking an antibiotic for her bladder infection starting 2 weeks ago. She was last seen by OBGYN yesterday. She denies history of blood clots/DVT.  She denies chest pains, cough, dysuria or pain with urination. She mentions history of heart murmur. CBC on Sept 24th showed hemoglobin of 10.3, which is down from 11.9 prior. She mentions she did have some pain between her shoulders last week, which has improved.   Wt Readings from Last 3 Encounters:  08/28/18 149 lb 12.8 oz (67.9 kg)  07/16/18 153 lb (69.4 kg)  05/01/18 155 lb 4.8 oz (70.4 kg)    She also complains of pain in her feet; history of neuropathy. She agrees to follow up with PCP and pain management to further this discuss this as nonurgent issue today.   Positive depression screening Depression screen [facility] 2/9 08/28/2018 03/13/2018 03/13/2018 02/20/2018 11/15/2017  Decreased  Interest 1 0 0 0 0  Down, Depressed, Hopeless 1 0 0 0 0  PHQ - 2 Score 2 0 0 0 0  Altered sleeping 2 - 2 - -  Tired, decreased energy 3 - 1 - -  Change in appetite 3 - 2 - -  Feeling bad or failure about yourself  0 - 0 - -  Trouble concentrating 3 - 1 - -  Moving slowly or fidgety/restless 1 - 2 - -  Suicidal thoughts 1 - 0 - -  PHQ-9 Score 15 - 8 - -  Difficult doing work/chores Not difficult at all - Somewhat difficult - -  Some recent data might be hidden   She has had a history of generalized anxiety, and is currently  on Wellbutrin. Review of PHQ on 03/13/18, she denied suicidal ideation at that time. At last office visit with Dr. Nolon Rod was in May, she was seen for benzodiazepine withdrawal. Plan at that time was to follow up with psychiatry regarding treatment of anxiety; referred to [place] group in August.   She denies suicidal thoughts today, when above screening discussed. She is fatigued with above.    Patient Active Problem List   Diagnosis Date Noted  . 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 Benzodiazepine withdrawal without complication ([place]) 24/23/5361  . Hypokalemia 03/13/2018  . Acute on chronic renal insufficiency 03/13/2018  . Generalized anxiety disorder 03/13/2018  . Acute cognitive decline 03/13/2018  . Adenocarcinoma of right lung, stage 1 ([place]) 10/22/2017  . Chronic pain syndrome 07/25/2017  . S/P left TKA 08/02/2015  . S/P knee replacement 08/02/2015  . Carpal tunnel syndrome 04/23/2015  . Autoimmune disease ([place]) 03/29/2013  . Apnea, sleep 03/29/2013  . Anxiety 11/28/2012  . Seizure disorder ([place]) 11/28/2012  . Anxiety state 11/28/2012  . History of pericarditis, with pericardial window in 2009, and recurrent episodes 2011,2013, treated with methotrexate 11/27/2012  . SOB (shortness of breath) 11/27/2012  . Hypothyroidism 11/27/2012  . History of partial nephrectomy, both rt. and lt 11/27/2012  . Hx of ovarian cancer 11/27/2012  . Venous insufficiency 11/27/2012  . Chronic back pain, hx of lumbar spondylosis and stenosis L5-S1 with hx decompression and total diskectomy 11/27/2012  . Hx of melanoma of skin 11/27/2012  . 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 Chronic venous insufficiency 11/27/2012  . DIARRHEA-PRESUMED INFECTIOUS 12/27/2009   Past Medical History:  Diagnosis Date  . Anemia   . Anxiety   . Arthritis    "everywhere" (08/19/2013)  . Bilateral carpal tunnel syndrome   . Chest pain at rest, rule out pericarditis  11/27/2012  . CHF (congestive heart failure) ([place])   . Chronic kidney disease    dr Risa Grill  . Chronic lower back pain   . Complication of anesthesia    "woke up twice during knee replacement" (08/19/2013)  . Depression   . Dyspnea    w/ exertion    . Fibromyalgia   . Headache(784.0)    hx of migraines   . Heart murmur   . History of pericarditis, with pericardial window in 2009 11/27/2012  . Hx of melanoma of skin 11/27/2012  . Hx of ovarian cancer 11/27/2012  . Hypothyroidism 11/27/2012  . Iron deficiency anemia   . Kidney carcinoma ([place])    "both kidneys; ~ 3 yr apart" (08/19/2013)  . Lung cancer ([place]) 2018  . Melanoma of back ([place])   . Migraines   . Pneumonia    "several  times; including today", RUL/notes 08/19/2013  . SOB (shortness of breath)    occasional with exertion   . Squamous carcinoma    "face, side of my nose, chest; used acid to get rid of them" (08/19/2013)  . Venous insufficiency 11/27/2012   Past Surgical History:  Procedure Laterality Date  . ABDOMINAL HYSTERECTOMY  1979  . ANTERIOR LAT LUMBAR FUSION Left 07/16/2018   Procedure: Left Lumbar two-three  Anterolateral decompression/interbody fusion with lateral plate fixation;  Surgeon: Kristeen Miss, MD;  Location: [place];  Service: Neurosurgery;  Laterality: Left;  . APPENDECTOMY    . DOPPLER ECHOCARDIOGRAPHY  02/23/2011   LVEF>50%  . FRACTURE SURGERY    . KNEE ARTHROSCOPY Right    "twice before the replacement" (08/19/2013)  . LEFT OOPHORECTOMY Left 1979  . [place]   "ruptured disc"  . MELANOMA EXCISION  ~ 2010   "my lower back" (08/19/2013)  . ORIF HIP FRACTURE Left    x 2 age 36 and 4  . PARTIAL NEPHRECTOMY Right 2005; ~ 2008  . PERICARDIAL WINDOW  ~ 2012  . POSTERIOR LUMBAR FUSION  2002?; 03/2012  . REDUCTION MAMMAPLASTY Bilateral ~ 2008  . RIGHT OOPHORECTOMY  ~ 2007   "it had cancer in it" (08/19/2013)  . SHOULDER ARTHROSCOPY W/ ROTATOR CUFF REPAIR Right 1990's  . TONSILLECTOMY  1954  .  TOTAL KNEE ARTHROPLASTY Right 1990's  . TOTAL KNEE ARTHROPLASTY Left 08/02/2015   Procedure: LEFT TOTAL KNEE ARTHROPLASTY;  Surgeon: Paralee Cancel, MD;  Location: [facility];  Service: Orthopedics;  Laterality: Left;  . TOTAL SHOULDER REPLACEMENT Left 2006?  . TUBAL LIGATION  1952  . VIDEO ASSISTED THORACOSCOPY (VATS)/WEDGE RESECTION Right 09/28/2017   Procedure: RIGHT VIDEO ASSISTED THORACOSCOPY (VATS)/WEDGE RESECTION, LYMPH NODE DISSECTION ;  Surgeon: Melrose Nakayama, MD;  Location: [place];  Service: Thoracic;  Laterality: Right;   Allergies  Allergen Reactions  . Amoxicillin Other (See Comments)    UNSPECIFIED REACTION  Has patient had a PCN reaction causing immediate rash, facial/tongue/throat swelling, SOB or lightheadedness with hypotension: No Has patient had a PCN reaction causing severe rash involving mucus membranes or skin necrosis: No Has patient had a PCN reaction that required hospitalization No Has patient had a PCN reaction occurring within the last 10 years: No If all of the above answers are "NO", then may proceed with Cephalosporin use.  . Ampicillin Rash and Other (See Comments)    Has patient had a PCN reaction causing immediate rash, facial/tongue/throat swelling, SOB or lightheadedness with hypotension: No Has patient had a PCN reaction causing severe rash involving mucus membranes or skin necrosis: No Has patient had a PCN reaction that required hospitalization No Has patient had a PCN reaction occurring within the last 10 years: No If all of the above answers are "NO", then may proceed with Cephalosporin use.  [place] [Buspirone] Nausea And Vomiting   Prior to Admission medications   Medication Sig Start Date End Date Taking? Authorizing Provider  buPROPion (WELLBUTRIN SR) 150 MG 12 hr tablet Take 1 tablet (150 mg total) by mouth 2 (two) times daily. Patient taking differently: Take 150 mg by mouth daily.  02/20/18   Forrest Moron, MD  cyclobenzaprine (FLEXERIL)  5 MG tablet Take 1 tablet (5 mg total) by mouth 3 (three) times daily as needed. For spasms Patient not taking: Reported on 07/10/2018 08/03/15   Danae Orleans, PA-C  ibuprofen (ADVIL,MOTRIN) 200 MG tablet Take 400 mg by mouth every  6 (six) hours as needed for headache or moderate pain.    [provider]  levothyroxine (SYNTHROID, LEVOTHROID) 100 MCG tablet TAKE 1 TABLET BY MOUTH BEFORE BREAKFAST MONDAY-FRIDAY SKIP SATURDAY ANS SUNDAY Patient taking differently: Take 100 mcg by mouth See admin instructions. Take 100 mcg by mouth daily before breakfast Monday thru Friday. Skip Saturday and Sunday. 04/26/18   Forrest Moron, MD  methocarbamol (ROBAXIN) 500 MG tablet Take 1 tablet (500 mg total) by mouth every 6 (six) hours as needed for muscle spasms. 07/18/18   Kristeen Miss, MD  morphine (MS CONTIN) 30 MG 12 hr tablet Take 1 tablet (30 mg total) by mouth every 12 (twelve) hours. 07/18/18   Kristeen Miss, MD  oxyCODONE-acetaminophen (PERCOCET/ROXICET) 5-325 MG tablet Take 1-2 tablets by mouth every 3 (three) hours as needed for severe pain (And breakthrough pain). 07/18/18   Kristeen Miss, MD  polyethylene glycol (MIRALAX / Floria Raveling) packet Take 17 g by mouth 2 (two) times daily. Patient taking differently: Take 17 g by mouth daily as needed for moderate constipation.  08/03/15   Danae Orleans, PA-C  PROAIR HFA 108 (90 BASE) MCG/ACT inhaler INHALE 2 PUFFS EVERY 4 HOURS AS NEEDED FOR WHEEZING, COUGH OR SHORTNESS OF BREATH Patient not taking: Reported on 07/10/2018    [name], MD  traMADol (ULTRAM) 50 MG tablet Take 1-2 tablets (50-100 mg total) by mouth every 6 (six) hours as needed (mild pain). Patient not taking: Reported on 07/10/2018 10/02/17   Jadene Pierini E, PA-C  triamterene-hydrochlorothiazide (MAXZIDE) 75-50 MG tablet Take 1 tablet by mouth daily.    [provider]  zolpidem (AMBIEN) 5 MG tablet TAKE 1 TABLET BY MOUTH AT BEDTIME AS NEEDED FOR SLEEP. Patient taking  differently: Take 5 mg by mouth at bedtime.  11/15/14   [name], MD   Social History   Socioeconomic History  . Marital status: Married    Spouse name: Not on file  . Number of children: Not on file  . Years of education: Not on file  . Highest education level: Not on file  Occupational History  . Not on file  Social Needs  . Financial resource strain: Not on file  . Food insecurity:    Worry: Not on file    Inability: Not on file  . Transportation needs:    Medical: Not on file    Non-medical: Not on file  Tobacco Use  . Smoking status: Former Smoker    Packs/day: 1.50    Years: 18.00    Pack years: 27.00    Types: Cigarettes    Last attempt to quit: 09/22/1985    Years since quitting: 32.9  . Smokeless tobacco: Never Used  Substance and Sexual Activity  . Alcohol use: No  . Drug use: No    Comment: prescribed  . Sexual activity: Never  Lifestyle  . Physical activity:    Days per week: Not on file    Minutes per session: Not on file  . Stress: Not on file  Relationships  . Social connections:    Talks on phone: Not on file    Gets together: Not on file    Attends religious service: Not on file    Active member of club or organization: Not on file    Attends meetings of clubs or organizations: Not on file    Relationship status: Not on file  . Intimate partner violence:    Fear of current or ex partner: Not  on file    Emotionally abused: Not on file    Physically abused: Not on file    Forced sexual activity: Not on file  Other Topics Concern  . Not on file  Social History Narrative   Married.   Review of Systems  Constitutional: Positive for fatigue and unexpected weight change.  Respiratory: Positive for shortness of breath. Negative for chest tightness.   Cardiovascular: Negative for chest pain, palpitations and leg swelling.  Gastrointestinal: Negative for abdominal pain and blood in stool.  Musculoskeletal: Positive for arthralgias and myalgias.   Neurological: Negative for dizziness, syncope, light-headedness and headaches.       Objective:   Physical Exam  Constitutional: She is oriented to person, place, and time. She appears well-developed and well-nourished.  HENT:  Head: Normocephalic and atraumatic.  Eyes: Pupils are equal, round, and reactive to light. Conjunctivae and EOM are normal.  Neck: Carotid bruit is not present.  Cardiovascular: Regular rhythm, normal heart sounds and intact distal pulses. Tachycardia present.  Pulmonary/Chest: Effort normal and breath sounds normal.  Abdominal: Soft. She exhibits no pulsatile midline mass. There is no tenderness.  Musculoskeletal: She exhibits no edema.  No peripheral edema; no chest pain with forward flexion  Neurological: She is alert and oriented to person, place, and time.  Skin: Skin is warm and dry.  Psychiatric: She has a normal mood and affect. Her behavior is normal.  Vitals reviewed.   Vitals:   08/28/18 1016  BP: 137/81  Pulse: (!) 106  Resp: 16  Temp: 98.2 F (36.8 C)  TempSrc: Oral  SpO2: 99%  Weight: 149 lb 12.8 oz (67.9 kg)  Height: 5\' 6"  (1.676 m)   EKG: sinus rhythm rate 92, Q-wave in lead iii along with T-wave inversion, flat t-waves in ii and aVF. Without apparent significant change from EKG on Dec 2018.   Results for orders placed or performed in visit on 08/28/18  POCT CBC  Result Value Ref Range   WBC 9.3 4.6 - 10.2 K/uL   Lymph, poc 2.4 0.6 - 3.4   POC LYMPH PERCENT 25.9 10 - 50 %L   MID (cbc) 0.5 0 - 0.9   POC MID % 4.9 0 - 12 %M   POC Granulocyte 6.4 2 - 6.9   Granulocyte percent 69.2 37 - 80 %G   RBC 3.92 (A) 4.04 - 5.48 M/uL   Hemoglobin 11.6 9.5 - 13.5 g/dL   HCT, POC 34.5 29 - 41 %   MCV 87.9 76 - 111 fL   MCH, POC 29.6 27 - 31.2 pg   MCHC 33.7 31.8 - 35.4 g/dL   RDW, POC 13.2 %   Platelet Count, POC 283 142 - 424 K/uL   MPV 7.7 0 - 99.8 fL   Dg Chest 2 View  Result Date: 08/28/2018 CLINICAL DATA:  Dyspnea on exertion.  History of prior right upper lobe wedge resection for lung cancer. EXAM: CHEST - 2 VIEW COMPARISON:  CT chest dated April 29, 2018. chest x-ray dated October 22, 2017. FINDINGS: The heart size and mediastinal contours are within normal limits. Normal pulmonary vascularity. Unchanged volume loss in the right hemithorax with postsurgical changes in the right upper lobe. No focal consolidation, pleural effusion, or pneumothorax. No acute osseous abnormality. IMPRESSION: Postsurgical changes in the right lung. No active cardiopulmonary disease. Electronically Signed   By: Titus Dubin M.D.   On: 08/28/2018 11:50       Assessment & Plan:    Amery  R Schoof is a 67 y.o. female History of pericarditis - Plan: POCT CBC, DG Chest 2 View, EKG 12-Lead  DOE (dyspnea on exertion) - Plan: POCT CBC, DG Chest 2 View, EKG 12-Lead  Fatigue, unspecified type - Plan: POCT CBC, DG Chest 2 View, EKG 12-Lead  Tachycardia  History of complicated medical history including prior lung cancer with resection, history of recurrent pericarditis, chronic pain syndrome.  Now with 3-week history of dyspnea on exertion, fatigue.  EKG without apparent acute findings, CBC without apparent acute anemia, improving hemoglobin from prior reading.  Does have a history of recurrent pericarditis in the past with symptoms of dyspnea.  Concerning for possible repeat pericarditis with tachycardia.  She did report pain between her shoulder blades recently, but not there presently Additionally with recent surgery and dyspnea on exertion, differential includes DVT/PE.  Less likely anginal symptoms as denies chest pain.  -Recommend further evaluation through emergency room today as may need CT, other testing for above.  She agrees to be seen for further work-up and will go by private vehicle.  PHQ/depression screening clarified as above, denied suicidal ideation.  Plan for follow-up with primary care provider, and pain management recommended  regarding chronic issues including her neuropathic pain in the feet.  No orders of the defined types were placed in this encounter.  Patient Instructions     With your current symptoms, further information and testing will need to be done through the emergency room. Please go there as soon as you leave the office. If any acute worsening of symptoms on the way there, pull over and call 911.   Please follow up with pain management and primary care provider to discuss other aches/pains. Return to the clinic or go to the nearest emergency room if any of your symptoms worsen or new symptoms occur.   Shortness of Breath, Adult Shortness of breath is when a person has trouble breathing enough air, or when a person feels like she or he is having trouble breathing in enough air. Shortness of breath could be a sign of medical problem. Follow these instructions at home: Pay attention to any changes in your symptoms. Take these actions to help with your condition:  Do not smoke. Smoking is a common cause of shortness of breath. If you smoke and you need help quitting, ask your health care provider.  Avoid things that can irritate your airways, such as: ? Mold. ? Dust. ? Air pollution. ? Chemical fumes. ? Things that can cause allergy symptoms (allergens), if you have allergies.  Keep your living space clean and free of mold and dust.  Rest as needed. Slowly return to your usual activities.  Take over-the-counter and prescription medicines, including oxygen and inhaled medicines, only as told by your health care provider.  Keep all follow-up visits as told by your health care provider. This is important.  Contact a health care provider if:  Your condition does not improve as soon as expected.  You have a hard time doing your normal activities, even after you rest.  You have new symptoms. Get help right away if:  Your shortness of breath gets worse.  You have shortness of breath when  you are resting.  You feel light-headed or you faint.  You have a cough that is not controlled with medicines.  You cough up blood.  You have pain with breathing.  You have pain in your chest, arms, shoulders, or abdomen.  You have a fever.  You cannot  walk up stairs or exercise the way that you normally do. This information is not intended to replace advice given to you by your health care provider. Make sure you discuss any questions you have with your health care provider. Document Released: 07/04/2001 Document Revised: 04/29/2016 Document Reviewed: 03/16/2016 Elsevier Interactive Patient Education  [employer].   If you have lab work done today you will be contacted with your lab results within the next 2 weeks.  If you have not heard from [place] then please contact us. The fastest way to get your results is to register for My Chart.   IF you received an x-ray today, you will receive an invoice from [facility]. Please contact [facility] at [phone] with questions or concerns regarding your invoice.   IF you received labwork today, you will receive an invoice from [facility]. Please contact [facility] at [phone] with questions or concerns regarding your invoice.   Our billing staff will not be able to assist you with questions regarding bills from these companies.  You will be contacted with the lab results as soon as they are available. The fastest way to get your results is to activate your My Chart account. Instructions are located on the last page of this paperwork. If you have not heard from [place] regarding the results in 2 weeks, please contact this office.       I personally performed the services described in this documentation, which was scribed in my presence. The recorded information has been reviewed and considered for accuracy and completeness, addended by me as needed, and agree with information above.  Signed,   Merri Ray,  MD [facility].  08/28/18 12:42 PM

## 2018-08-29 ENCOUNTER — Telehealth: Payer: Self-pay | Admitting: Family Medicine

## 2018-08-29 ENCOUNTER — Telehealth: Payer: Self-pay

## 2018-08-29 NOTE — Telephone Encounter (Signed)
Patient contacted the office today stating she was having trouble breathing.  She stated that this has been going on since her back surgery which she has had complications since.  She stated that she went to her primary care physician yesterday, and stated that she had a chest xray done.  She was advised at that appointment to go to the emergency room and have additional testing done, CT scan because the physician thought she may have a blood clot in her lungs.  She stated that she did not feel that bad and she did not think she had a blood clot.  She said that she would like for Dr. Roxan Hockey to look at the chest xray.  I advised her that Dr. Roxan Hockey is not in the office today and she should follow the advise of her primary care physician and go to the emergency room.  She acknowledged receipt.

## 2018-08-29 NOTE — Telephone Encounter (Signed)
Copied from [place] [id number]. Topic: Quick Communication - See Telephone Encounter >> Aug 29, 2018 10:24 AM Cecelia Byars, NT wrote: CRM for notification. See Telephone encounter for: 08/29/18. Patient called and said that she was told yesterday by Dr Nyoka Cowden to go to the emergency , please call her at  she has questions  she prefers  not to got , please go ,[phone]

## 2018-08-30 NOTE — Telephone Encounter (Signed)
Called patient - explained concerns from last visit including concern for pericarditis or possible pulmonary embolus. She feels possibly less short of breath and plans on study scheduled by her cardiologist.  Still recommended eval in ER as previously recommended for above possible conditions and risks of nontreatment or delay in treatment were discussed with understanding expressed.

## 2018-08-30 NOTE — Telephone Encounter (Signed)
Message sent to Dr. Carlota Raspberry  Pt prefers not to go to ED

## 2018-09-17 DIAGNOSIS — H2513 Age-related nuclear cataract, bilateral: Secondary | ICD-10-CM | POA: Diagnosis not present

## 2018-09-17 DIAGNOSIS — H04123 Dry eye syndrome of bilateral lacrimal glands: Secondary | ICD-10-CM | POA: Diagnosis not present

## 2018-09-26 ENCOUNTER — Ambulatory Visit: Payer: Medicare Other | Admitting: Family Medicine

## 2018-10-01 DIAGNOSIS — M961 Postlaminectomy syndrome, not elsewhere classified: Secondary | ICD-10-CM | POA: Diagnosis not present

## 2018-10-01 DIAGNOSIS — G5603 Carpal tunnel syndrome, bilateral upper limbs: Secondary | ICD-10-CM | POA: Diagnosis not present

## 2018-10-03 DIAGNOSIS — F411 Generalized anxiety disorder: Secondary | ICD-10-CM | POA: Diagnosis not present

## 2018-10-03 DIAGNOSIS — Z23 Encounter for immunization: Secondary | ICD-10-CM | POA: Diagnosis not present

## 2018-10-03 DIAGNOSIS — E039 Hypothyroidism, unspecified: Secondary | ICD-10-CM | POA: Diagnosis not present

## 2018-10-31 ENCOUNTER — Inpatient Hospital Stay: Payer: Medicare Other | Attending: Internal Medicine

## 2018-10-31 ENCOUNTER — Ambulatory Visit (HOSPITAL_COMMUNITY)
Admission: RE | Admit: 2018-10-31 | Discharge: 2018-10-31 | Disposition: A | Discharge status: Home / Self Care | Payer: Medicare Other | Source: Ambulatory Visit | Attending: Internal Medicine | Admitting: Internal Medicine

## 2018-10-31 DIAGNOSIS — I2510 Atherosclerotic heart disease of native coronary artery without angina pectoris: Secondary | ICD-10-CM | POA: Diagnosis not present

## 2018-10-31 DIAGNOSIS — Z85828 Personal history of other malignant neoplasm of skin: Secondary | ICD-10-CM | POA: Diagnosis not present

## 2018-10-31 DIAGNOSIS — Z85820 Personal history of malignant melanoma of skin: Secondary | ICD-10-CM | POA: Diagnosis not present

## 2018-10-31 DIAGNOSIS — Z85118 Personal history of other malignant neoplasm of bronchus and lung: Secondary | ICD-10-CM | POA: Diagnosis not present

## 2018-10-31 DIAGNOSIS — I700 Atherosclerosis of aorta: Secondary | ICD-10-CM | POA: Insufficient documentation

## 2018-10-31 DIAGNOSIS — C3490 Malignant neoplasm of unspecified part of unspecified bronchus or lung: Secondary | ICD-10-CM | POA: Diagnosis not present

## 2018-10-31 DIAGNOSIS — Z8543 Personal history of malignant neoplasm of ovary: Secondary | ICD-10-CM | POA: Diagnosis not present

## 2018-10-31 LAB — CBC WITH DIFFERENTIAL (CANCER CENTER ONLY)
Abs Immature Granulocytes: 0.02 10*3/uL (ref 0.00–0.07)
Basophils Absolute: 0.1 10*3/uL (ref 0.0–0.1)
Basophils Relative: 1 %
Eosinophils Absolute: 0.3 10*3/uL (ref 0.0–0.5)
Eosinophils Relative: 5 %
HCT: 33 % — ABNORMAL LOW (ref 36.0–46.0)
Hemoglobin: 10.4 g/dL — ABNORMAL LOW (ref 12.0–15.0)
Immature Granulocytes: 0 %
Lymphocytes Relative: 35 %
Lymphs Abs: 2.4 10*3/uL (ref 0.7–4.0)
MCH: 28.4 pg (ref 26.0–34.0)
MCHC: 31.5 g/dL (ref 30.0–36.0)
MCV: 90.2 fL (ref 80.0–100.0)
Monocytes Absolute: 0.5 10*3/uL (ref 0.1–1.0)
Monocytes Relative: 8 %
Neutro Abs: 3.5 10*3/uL (ref 1.7–7.7)
Neutrophils Relative %: 51 %
Platelet Count: 165 10*3/uL (ref 150–400)
RBC: 3.66 MIL/uL — ABNORMAL LOW (ref 3.87–5.11)
RDW: 14.4 % (ref 11.5–15.5)
WBC Count: 6.9 10*3/uL (ref 4.0–10.5)
nRBC: 0 % (ref 0.0–0.2)

## 2018-10-31 LAB — CMP (CANCER CENTER ONLY)
ALT: 19 U/L (ref 0–44)
AST: 22 U/L (ref 15–41)
Albumin: 4 g/dL (ref 3.5–5.0)
Alkaline Phosphatase: 81 U/L (ref 38–126)
Anion gap: 11 (ref 5–15)
BUN: 33 mg/dL — ABNORMAL HIGH (ref 8–23)
CO2: 26 mmol/L (ref 22–32)
Calcium: 9.6 mg/dL (ref 8.9–10.3)
Chloride: 104 mmol/L (ref 98–111)
Creatinine: 1.27 mg/dL — ABNORMAL HIGH (ref 0.44–1.00)
GFR, Est AFR Am: 50 mL/min — ABNORMAL LOW (ref >60)
GFR, Estimated: 43 mL/min — ABNORMAL LOW (ref >60)
Glucose, Bld: 89 mg/dL (ref 70–99)
Potassium: 3.9 mmol/L (ref 3.5–5.1)
Sodium: 141 mmol/L (ref 135–145)
Total Bilirubin: 0.4 mg/dL (ref 0.3–1.2)
Total Protein: 7.1 g/dL (ref 6.5–8.1)

## 2018-11-01 ENCOUNTER — Inpatient Hospital Stay: Payer: Medicare Other

## 2018-11-04 ENCOUNTER — Encounter: Payer: Self-pay | Admitting: Internal Medicine

## 2018-11-04 ENCOUNTER — Telehealth: Payer: Self-pay | Admitting: Internal Medicine

## 2018-11-04 ENCOUNTER — Inpatient Hospital Stay (HOSPITAL_BASED_OUTPATIENT_CLINIC_OR_DEPARTMENT_OTHER): Payer: Medicare Other | Admitting: Internal Medicine

## 2018-11-04 VITALS — BP 143/73 | HR 96 | Temp 98.1°F | Resp 18 | Ht 66.0 in | Wt 147.9 lb

## 2018-11-04 DIAGNOSIS — Z8543 Personal history of malignant neoplasm of ovary: Secondary | ICD-10-CM | POA: Diagnosis not present

## 2018-11-04 DIAGNOSIS — Z85118 Personal history of other malignant neoplasm of bronchus and lung: Secondary | ICD-10-CM | POA: Diagnosis not present

## 2018-11-04 DIAGNOSIS — C3491 Malignant neoplasm of unspecified part of right bronchus or lung: Secondary | ICD-10-CM

## 2018-11-04 DIAGNOSIS — Z85820 Personal history of malignant melanoma of skin: Secondary | ICD-10-CM

## 2018-11-04 DIAGNOSIS — I700 Atherosclerosis of aorta: Secondary | ICD-10-CM | POA: Diagnosis not present

## 2018-11-04 DIAGNOSIS — C3490 Malignant neoplasm of unspecified part of unspecified bronchus or lung: Secondary | ICD-10-CM

## 2018-11-04 DIAGNOSIS — I2510 Atherosclerotic heart disease of native coronary artery without angina pectoris: Secondary | ICD-10-CM

## 2018-11-04 DIAGNOSIS — Z85828 Personal history of other malignant neoplasm of skin: Secondary | ICD-10-CM

## 2018-11-04 NOTE — Progress Notes (Signed)
[facility] [phone]   [phone]) [phone]  OFFICE PROGRESS NOTE  Forrest Moron, MD [place] [address]  DIAGNOSIS: Stage IA (T1a, N0, M0) non-small cell lung cancer, well-differentiated adenocarcinoma.  PRIOR THERAPY:  Status post wedge resection of the right upper lobe under the care of Dr. Roxan Hockey on September 28, 2017.  CURRENT THERAPY: Observation.  INTERVAL HISTORY: [name] 68 y.o. female returns to the clinic today for follow-up visit accompanied by her sister.  The patient is feeling fine today with no concerning complaints except for fatigue.  She denied having any chest pain, shortness of breath, cough or hemoptysis.  She denied having any nausea, vomiting, diarrhea or constipation.  She has no headache or visual changes.  The patient had repeat CT scan of the chest performed recently and she is here for evaluation and discussion of her risk her results.  MEDICAL HISTORY: Past Medical History:  Diagnosis Date  . Anemia   . Anxiety   . Arthritis    "everywhere" (08/19/2013)  . Bilateral carpal tunnel syndrome   . Chest pain at rest, rule out pericarditis 11/27/2012  . CHF (congestive heart failure) ([place])   . Chronic kidney disease    dr Risa Grill  . Chronic lower back pain   . Complication of anesthesia    "woke up twice during knee replacement" (08/19/2013)  . Depression   . Dyspnea    w/ exertion    . Fibromyalgia   . Headache(784.0)    hx of migraines   . Heart murmur   . History of pericarditis, with pericardial window in 2009 11/27/2012  . Hx of melanoma of skin 11/27/2012  . Hx of ovarian cancer 11/27/2012  . Hypothyroidism 11/27/2012  . Iron deficiency anemia   . Kidney carcinoma ([place])    "both kidneys; ~ 3 yr apart" (08/19/2013)  . Lung cancer ([place]) 2018  . Melanoma of back ([place])   . Migraines   . Pneumonia    "several times; including today", RUL/notes 08/19/2013  . SOB (shortness of breath)    occasional with exertion   . Squamous carcinoma    "face, side of my nose, chest; used acid to get rid of them" (08/19/2013)  . Venous insufficiency 11/27/2012    ALLERGIES:  is allergic to amoxicillin; ampicillin; and buspar [buspirone].  MEDICATIONS:  Current Outpatient Medications  Medication Sig Dispense Refill  . buPROPion (WELLBUTRIN SR) 150 MG 12 hr tablet Take 1 tablet (150 mg total) by mouth 2 (two) times daily. (Patient taking differently: Take 150 mg by mouth daily. ) 60 tablet 4  . cyclobenzaprine (FLEXERIL) 5 MG tablet Take 1 tablet (5 mg total) by mouth 3 (three) times daily as needed. For spasms 30 tablet 0  . ibuprofen (ADVIL,MOTRIN) 200 MG tablet Take 400 mg by mouth every 6 (six) hours as needed for headache or moderate pain.    Marland Kitchen levothyroxine (SYNTHROID, LEVOTHROID) 100 MCG tablet TAKE 1 TABLET BY MOUTH BEFORE BREAKFAST MONDAY-FRIDAY SKIP SATURDAY ANS SUNDAY (Patient taking differently: Take 100 mcg by mouth See admin instructions. Take 100 mcg by mouth daily before breakfast Monday thru Friday. Skip Saturday and Sunday.) 30 tablet 3  . methocarbamol (ROBAXIN) 500 MG tablet Take 1 tablet (500 mg total) by mouth every 6 (six) hours as needed for muscle spasms. 40 tablet 3  . morphine (MS CONTIN) 30 MG 12 hr tablet Take 1 tablet (30 mg total) by mouth every 12 (  twelve) hours. 60 tablet 0  . oxyCODONE-acetaminophen (PERCOCET/ROXICET) 5-325 MG tablet Take 1-2 tablets by mouth every 3 (three) hours as needed for severe pain (And breakthrough pain). 60 tablet 0  . polyethylene glycol (MIRALAX / GLYCOLAX) packet Take 17 g by mouth 2 (two) times daily. (Patient taking differently: Take 17 g by mouth daily as needed for moderate constipation. ) 14 each 0  . PROAIR HFA 108 (90 BASE) MCG/ACT inhaler INHALE 2 PUFFS EVERY 4 HOURS AS NEEDED FOR WHEEZING, COUGH OR SHORTNESS OF BREATH 8.5 g 3  . traMADol (ULTRAM) 50 MG tablet Take 1-2 tablets (50-100 mg total) by mouth every 6 (six) hours as  needed (mild pain). 30 tablet 0  . triamterene-hydrochlorothiazide (MAXZIDE) 75-50 MG tablet Take 1 tablet by mouth daily.    Marland Kitchen zolpidem (AMBIEN) 5 MG tablet TAKE 1 TABLET BY MOUTH AT BEDTIME AS NEEDED FOR SLEEP. (Patient taking differently: Take 5 mg by mouth at bedtime. ) 30 tablet 2   No current facility-administered medications for this visit.     SURGICAL HISTORY:  Past Surgical History:  Procedure Laterality Date  . ABDOMINAL HYSTERECTOMY  1979  . ANTERIOR LAT LUMBAR FUSION Left 07/16/2018   Procedure: Left Lumbar two-three  Anterolateral decompression/interbody fusion with lateral plate fixation;  Surgeon: Kristeen Miss, MD;  Location: [employer];  Service: Neurosurgery;  Laterality: Left;  . APPENDECTOMY    . DOPPLER ECHOCARDIOGRAPHY  02/23/2011   LVEF>50%  . FRACTURE SURGERY    . KNEE ARTHROSCOPY Right    "twice before the replacement" (08/19/2013)  . LEFT OOPHORECTOMY Left 1979  . [place]   "ruptured disc"  . MELANOMA EXCISION  ~ 2010   "my lower back" (08/19/2013)  . ORIF HIP FRACTURE Left    x 2 age 78 and 4  . PARTIAL NEPHRECTOMY Right 2005; ~ 2008  . PERICARDIAL WINDOW  ~ 2012  . POSTERIOR LUMBAR FUSION  2002?; 03/2012  . REDUCTION MAMMAPLASTY Bilateral ~ 2008  . RIGHT OOPHORECTOMY  ~ 2007   "it had cancer in it" (08/19/2013)  . SHOULDER ARTHROSCOPY W/ ROTATOR CUFF REPAIR Right 1990's  . TONSILLECTOMY  1954  . TOTAL KNEE ARTHROPLASTY Right 1990's  . TOTAL KNEE ARTHROPLASTY Left 08/02/2015   Procedure: LEFT TOTAL KNEE ARTHROPLASTY;  Surgeon: Paralee Cancel, MD;  Location: [facility];  Service: Orthopedics;  Laterality: Left;  . TOTAL SHOULDER REPLACEMENT Left 2006?  . TUBAL LIGATION  1952  . VIDEO ASSISTED THORACOSCOPY (VATS)/WEDGE RESECTION Right 09/28/2017   Procedure: RIGHT VIDEO ASSISTED THORACOSCOPY (VATS)/WEDGE RESECTION, LYMPH NODE DISSECTION ;  Surgeon: Melrose Nakayama, MD;  Location: [facility];  Service: Thoracic;  Laterality: Right;    REVIEW OF  SYSTEMS:  A comprehensive review of systems was negative except for: Constitutional: positive for fatigue   PHYSICAL EXAMINATION: General appearance: alert, cooperative, fatigued and no distress Head: Normocephalic, without obvious abnormality, atraumatic Neck: no adenopathy, no JVD, supple, symmetrical, trachea midline and thyroid not enlarged, symmetric, no tenderness/mass/nodules Lymph nodes: Cervical, supraclavicular, and axillary nodes normal. Resp: clear to auscultation bilaterally Back: symmetric, no curvature. ROM normal. No CVA tenderness. Cardio: regular rate and rhythm, S1, S2 normal, no murmur, click, rub or gallop GI: soft, non-tender; bowel sounds normal; no masses,  no organomegaly Extremities: extremities normal, atraumatic, no cyanosis or edema  ECOG PERFORMANCE STATUS: 1 - Symptomatic but completely ambulatory  Blood pressure (!) 143/73, pulse 96, temperature 98.1 F (36.7 C), temperature source Oral, resp. rate 18, height 5\' 6"  (  1.676 m), weight 147 lb 14.4 oz (67.1 kg), SpO2 99 %.  LABORATORY DATA: Lab Results  Component Value Date   WBC 6.9 10/31/2018   HGB 10.4 (L) 10/31/2018   HCT 33.0 (L) 10/31/2018   MCV 90.2 10/31/2018   PLT 165 10/31/2018      Chemistry      Component Value Date/Time   NA 141 10/31/2018 1429   NA 144 03/13/2018 1427   K 3.9 10/31/2018 1429   CL 104 10/31/2018 1429   CO2 26 10/31/2018 1429   BUN 33 (H) 10/31/2018 1429   BUN 42 (H) 03/13/2018 1427   CREATININE 1.27 (H) 10/31/2018 1429   CREATININE 1.33 (H) 09/02/2015 1220      Component Value Date/Time   CALCIUM 9.6 10/31/2018 1429   ALKPHOS 81 10/31/2018 1429   AST 22 10/31/2018 1429   ALT 19 10/31/2018 1429   BILITOT 0.4 10/31/2018 1429       RADIOGRAPHIC STUDIES: Ct Chest Wo Contrast  Result Date: 10/31/2018 CLINICAL DATA:  68 year old female with history of non-small cell lung cancer (well-differentiated adenocarcinoma), stage IA, status post wedge resection in the  right upper lobe. Follow-up study. EXAM: CT CHEST WITHOUT CONTRAST TECHNIQUE: Multidetector CT imaging of the chest was performed following the standard protocol without IV contrast. COMPARISON:  Chest CT 04/29/2018. FINDINGS: Cardiovascular: Heart size is normal. There is no significant pericardial fluid, thickening or pericardial calcification. There is aortic atherosclerosis, as well as atherosclerosis of the great vessels of the mediastinum and the coronary arteries, including calcified atherosclerotic plaque in the left main, left anterior descending, left circumflex and right coronary arteries. Mediastinum/Nodes: No pathologically enlarged mediastinal or hilar lymph nodes. Please note that accurate exclusion of hilar adenopathy is limited on noncontrast CT scans. Esophagus is unremarkable in appearance. No axillary lymphadenopathy. Lungs/Pleura: Status post wedge resection in the right upper lobe. No findings to suggest locally recurrent disease. A few scattered 1-2 mm pulmonary nodules in the lungs bilaterally, similar to the prior study, likely to reflect areas of mucoid impaction within terminal bronchioles. No larger more suspicious appearing pulmonary nodules or masses are noted. No acute consolidative airspace disease. No pleural effusions. Upper Abdomen: Aortic atherosclerosis. Musculoskeletal: Chronic compression fracture in L1 with 20% loss of anterior vertebral body height and post vertebroplasty changes. There are no aggressive appearing lytic or blastic lesions noted in the visualized portions of the skeleton. IMPRESSION: 1. Status post right upper lobe wedge resection. No findings to suggest locally recurrent disease or metastatic disease in the thorax. 2. Aortic atherosclerosis, in addition to left main and 3 vessel coronary artery disease. Please note that although the presence of coronary artery calcium documents the presence of coronary artery disease, the severity of this disease and any  potential stenosis cannot be assessed on this non-gated CT examination. Assessment for potential risk factor modification, dietary therapy or pharmacologic therapy may be warranted, if clinically indicated. Aortic Atherosclerosis (ICD10-I70.0). Electronically Signed   By: Vinnie Langton M.D.   On: 10/31/2018 18:47    ASSESSMENT AND PLAN: This is a very pleasant 68 years old white female with history of a stage IA non-small cell lung cancer, adenocarcinoma status post wedge resection of the right upper lobe in December 2018.   The patient is currently on observation and she is feeling fine. Repeat CT scan of the chest showed no concerning findings for disease recurrence or progression.  The scan showed aortic atherosclerosis in addition to three-vessel coronary artery disease.  I recommended  for the patient to discuss the results with her primary care physician and to see if she would need referral to cardiology for her condition. I will see her back for follow-up visit in 6 months for evaluation with repeat CT scan of the chest. She was advised to call immediately if she has any concerning symptoms in the interval. The patient voices understanding of current disease status and treatment options and is in agreement with the current care plan. All questions were answered. The patient knows to call the clinic with any problems, questions or concerns. We can certainly see the patient much sooner if necessary.  I spent 10 minutes counseling the patient face to face. The total time spent in the appointment was 15 minutes.  Disclaimer: This note was dictated with voice recognition software. Similar sounding words can inadvertently be transcribed and may not be corrected upon review.

## 2018-11-04 NOTE — Telephone Encounter (Signed)
Printed calendar and avs.  Gave patient the number to central radiology.

## 2018-11-06 DIAGNOSIS — M48062 Spinal stenosis, lumbar region with neurogenic claudication: Secondary | ICD-10-CM | POA: Diagnosis not present

## 2018-11-06 DIAGNOSIS — G5603 Carpal tunnel syndrome, bilateral upper limbs: Secondary | ICD-10-CM | POA: Diagnosis not present

## 2018-11-06 DIAGNOSIS — R030 Elevated blood-pressure reading, without diagnosis of hypertension: Secondary | ICD-10-CM | POA: Diagnosis not present

## 2018-11-06 DIAGNOSIS — Z79891 Long term (current) use of opiate analgesic: Secondary | ICD-10-CM | POA: Diagnosis not present

## 2018-11-08 DIAGNOSIS — M5116 Intervertebral disc disorders with radiculopathy, lumbar region: Secondary | ICD-10-CM | POA: Diagnosis not present

## 2018-11-08 DIAGNOSIS — M5416 Radiculopathy, lumbar region: Secondary | ICD-10-CM | POA: Diagnosis not present

## 2018-11-08 DIAGNOSIS — Z981 Arthrodesis status: Secondary | ICD-10-CM | POA: Diagnosis not present

## 2018-11-14 DIAGNOSIS — N183 Chronic kidney disease, stage 3 (moderate): Secondary | ICD-10-CM | POA: Diagnosis not present

## 2018-11-14 DIAGNOSIS — E559 Vitamin D deficiency, unspecified: Secondary | ICD-10-CM | POA: Diagnosis not present

## 2018-11-14 DIAGNOSIS — R9431 Abnormal electrocardiogram [ECG] [EKG]: Secondary | ICD-10-CM | POA: Diagnosis not present

## 2018-11-14 DIAGNOSIS — I10 Essential (primary) hypertension: Secondary | ICD-10-CM | POA: Diagnosis not present

## 2018-11-14 DIAGNOSIS — Z1159 Encounter for screening for other viral diseases: Secondary | ICD-10-CM | POA: Diagnosis not present

## 2018-11-14 DIAGNOSIS — I2510 Atherosclerotic heart disease of native coronary artery without angina pectoris: Secondary | ICD-10-CM | POA: Diagnosis not present

## 2018-11-14 DIAGNOSIS — C3491 Malignant neoplasm of unspecified part of right bronchus or lung: Secondary | ICD-10-CM | POA: Diagnosis not present

## 2018-11-14 DIAGNOSIS — E039 Hypothyroidism, unspecified: Secondary | ICD-10-CM | POA: Diagnosis not present

## 2018-11-14 DIAGNOSIS — R5383 Other fatigue: Secondary | ICD-10-CM | POA: Diagnosis not present

## 2018-11-14 DIAGNOSIS — R6889 Other general symptoms and signs: Secondary | ICD-10-CM | POA: Diagnosis not present

## 2018-11-19 ENCOUNTER — Telehealth: Payer: Self-pay | Admitting: *Deleted

## 2018-11-19 NOTE — Telephone Encounter (Signed)
REFERRAL SENT TO SCHEDULING AND NOTES ON FILE FROM [facility] DR. DAVID Coletta Memos [phone].

## 2018-11-20 DIAGNOSIS — K146 Glossodynia: Secondary | ICD-10-CM | POA: Diagnosis not present

## 2018-11-20 DIAGNOSIS — G894 Chronic pain syndrome: Secondary | ICD-10-CM | POA: Diagnosis not present

## 2018-11-20 DIAGNOSIS — I10 Essential (primary) hypertension: Secondary | ICD-10-CM | POA: Diagnosis not present

## 2018-11-20 DIAGNOSIS — M48062 Spinal stenosis, lumbar region with neurogenic claudication: Secondary | ICD-10-CM | POA: Diagnosis not present

## 2018-11-25 ENCOUNTER — Ambulatory Visit (INDEPENDENT_AMBULATORY_CARE_PROVIDER_SITE_OTHER): Payer: Medicare Other | Admitting: Cardiology

## 2018-11-25 ENCOUNTER — Encounter: Payer: Self-pay | Admitting: Cardiology

## 2018-11-25 VITALS — BP 126/64 | HR 92 | Ht 66.0 in | Wt 143.5 lb

## 2018-11-25 DIAGNOSIS — I2510 Atherosclerotic heart disease of native coronary artery without angina pectoris: Secondary | ICD-10-CM

## 2018-11-25 DIAGNOSIS — E785 Hyperlipidemia, unspecified: Secondary | ICD-10-CM

## 2018-11-25 DIAGNOSIS — R0602 Shortness of breath: Secondary | ICD-10-CM | POA: Diagnosis not present

## 2018-11-25 NOTE — Progress Notes (Signed)
Cardiology Office Note   Date:  11/25/2018   ID:  [name], DOB 1951-04-27, MRN [id number]  PCP:  Bernerd Limbo, MD  Cardiologist:   No primary care provider on file. Referring:  Bernerd Limbo, MD  Chief Complaint  Patient presents with  . Coronary Calcium      History of Present Illness: [name] is a 68 y.o. female who is referred by Bernerd Limbo, MD for evaluation of coronary calcium.  This was noted on CT.  She has follow-up CTs because she is had lung cancer.  She is found to have three-vessel calcification.  Her only cardiac history includes pericarditis.  She had a pericardial window years ago.  Her most recent evaluation was an echocardiogram in 2014.  She had a slightly reduced ejection fraction of 50% with global hypokinesis.  She had mild mitral regurgitation.  She also had a stress perfusion study in 2014.  Her EF there was said to be 69%.  This was normal without any evidence of ischemia or infarct.  She has had some chronic mild dyspnea that has been slowly progressive.  However, she does her activities such as walking the dog and vacuuming.  She might get fatigued with this.  She might get somewhat short of breath.  However, this is not been a new significant change.  It is been slowly progressive.  She denies any resting shortness of breath, PND or orthopnea.  She has no palpitations, presyncope or syncope.  She denies any chest heaviness, neck or arm discomfort.  She is had no cough fevers chills weight gain or edema.   Past Medical History:  Diagnosis Date  . Anemia   . Anxiety   . Arthritis    "everywhere" (08/19/2013)  . Bilateral carpal tunnel syndrome   . Chest pain at rest, rule out pericarditis 11/27/2012  . CHF (congestive heart failure) ([place])   . Chronic kidney disease    dr Risa Grill  . Chronic lower back pain   . Complication of anesthesia    "woke up twice during knee replacement" (08/19/2013)  . Depression   . Dyspnea    w/ exertion    .  Fibromyalgia   . Headache(784.0)    hx of migraines   . History of pericarditis, with pericardial window in 2009 11/27/2012  . Hx of ovarian cancer 11/27/2012  . Hypothyroidism 11/27/2012  . Iron deficiency anemia   . Kidney carcinoma ([place])    "both kidneys; ~ 3 yr apart" (08/19/2013)  . Lung cancer ([place]) 2018  . Melanoma of back ([place])   . Migraines   . Pneumonia    "several times; including today", RUL/notes 08/19/2013  . Squamous carcinoma    "face, side of my nose, chest; used acid to get rid of them" (08/19/2013)  . Venous insufficiency 11/27/2012    Past Surgical History:  Procedure Laterality Date  . ABDOMINAL HYSTERECTOMY  1979  . ANTERIOR LAT LUMBAR FUSION Left 07/16/2018   Procedure: Left Lumbar two-three  Anterolateral decompression/interbody fusion with lateral plate fixation;  Surgeon: Kristeen Miss, MD;  Location: [facility];  Service: Neurosurgery;  Laterality: Left;  . APPENDECTOMY    . DOPPLER ECHOCARDIOGRAPHY  02/23/2011   LVEF>50%  . FRACTURE SURGERY    . KNEE ARTHROSCOPY Right    "twice before the replacement" (08/19/2013)  . LEFT OOPHORECTOMY Left 1979  . [place]   "ruptured disc"  . MELANOMA EXCISION  ~ 2010   "my  lower back" (08/19/2013)  . ORIF HIP FRACTURE Left    x 2 age 5 and 4  . PARTIAL NEPHRECTOMY Right 2005; ~ 2008  . PERICARDIAL WINDOW  ~ 2012  . POSTERIOR LUMBAR FUSION  2002?; 03/2012  . REDUCTION MAMMAPLASTY Bilateral ~ 2008  . RIGHT OOPHORECTOMY  ~ 2007   "it had cancer in it" (08/19/2013)  . SHOULDER ARTHROSCOPY W/ ROTATOR CUFF REPAIR Right 1990's  . TONSILLECTOMY  1954  . TOTAL KNEE ARTHROPLASTY Right 1990's  . TOTAL KNEE ARTHROPLASTY Left 08/02/2015   Procedure: LEFT TOTAL KNEE ARTHROPLASTY;  Surgeon: Paralee Cancel, MD;  Location[facility];  Service: Orthopedics;  Laterality: Left;  . TOTAL SHOULDER REPLACEMENT Left 2006?  . TUBAL LIGATION  1952  . VIDEO ASSISTED THORACOSCOPY (VATS)/WEDGE RESECTION Right 09/28/2017   Procedure:  RIGHT VIDEO ASSISTED THORACOSCOPY (VATS)/WEDGE RESECTION, LYMPH NODE DISSECTION ;  Surgeon: Melrose Nakayama, MD;  Location: [facility];  Service: Thoracic;  Laterality: Right;     Current Outpatient Medications  Medication Sig Dispense Refill  . buPROPion (WELLBUTRIN SR) 150 MG 12 hr tablet Take 1 tablet (150 mg total) by mouth 2 (two) times daily. (Patient taking differently: Take 150 mg by mouth daily. ) 60 tablet 4  . cyclobenzaprine (FLEXERIL) 5 MG tablet Take 1 tablet (5 mg total) by mouth 3 (three) times daily as needed. For spasms 30 tablet 0  . diclofenac sodium (VOLTAREN) 1 % GEL Apply topically as directed.    Marland Kitchen ibuprofen (ADVIL,MOTRIN) 200 MG tablet Take 400 mg by mouth every 6 (six) hours as needed for headache or moderate pain.    Marland Kitchen levothyroxine (SYNTHROID, LEVOTHROID) 100 MCG tablet TAKE 1 TABLET BY MOUTH BEFORE BREAKFAST MONDAY-FRIDAY SKIP SATURDAY ANS SUNDAY (Patient taking differently: Take 100 mcg by mouth See admin instructions. Take 100 mcg by mouth daily before breakfast Monday thru Friday. Skip Saturday and Sunday.) 30 tablet 3  . lisinopril (PRINIVIL,ZESTRIL) 10 MG tablet Take 1 tablet by mouth daily.    . methocarbamol (ROBAXIN) 500 MG tablet Take 1 tablet (500 mg total) by mouth every 6 (six) hours as needed for muscle spasms. 40 tablet 3  . morphine (MS CONTIN) 30 MG 12 hr tablet Take 1 tablet (30 mg total) by mouth every 12 (twelve) hours. 60 tablet 0  . polyethylene glycol (MIRALAX / GLYCOLAX) packet Take 17 g by mouth 2 (two) times daily. (Patient taking differently: Take 17 g by mouth daily as needed for moderate constipation. ) 14 each 0  . PROAIR HFA 108 (90 BASE) MCG/ACT inhaler INHALE 2 PUFFS EVERY 4 HOURS AS NEEDED FOR WHEEZING, COUGH OR SHORTNESS OF BREATH 8.5 g 3  . traMADol (ULTRAM) 50 MG tablet Take 1-2 tablets (50-100 mg total) by mouth every 6 (six) hours as needed (mild pain). 30 tablet 0  . triamterene-hydrochlorothiazide (MAXZIDE) 75-50 MG tablet Take 1  tablet by mouth daily.    . vitamin B-12 (CYANOCOBALAMIN) 1000 MCG tablet Take 1 tablet by mouth daily.    . Vitamin D, Ergocalciferol, (DRISDOL) 1.25 MG (50000 UT) CAPS capsule Take 1 capsule by mouth as directed.    . zolpidem (AMBIEN) 5 MG tablet TAKE 1 TABLET BY MOUTH AT BEDTIME AS NEEDED FOR SLEEP. (Patient taking differently: Take 5 mg by mouth at bedtime. ) 30 tablet 2   No current facility-administered medications for this visit.     Allergies:   Amoxicillin; Ampicillin; and Buspar [buspirone]    Social History:  The patient  reports that she  quit smoking about 33 years ago. Her smoking use included cigarettes. She has a 27.00 pack-year smoking history. She has never used smokeless tobacco. She reports that she does not drink alcohol or use drugs.   Family History:  The patient's family history includes Bipolar disorder in her sister; Cancer (age of onset: 1) in her mother; Cancer (age of onset: 9) in her father.    ROS:  Please see the history of present illness.   Otherwise, review of systems are positive for joint pains, chronic back pain.   All other systems are reviewed and negative.    PHYSICAL EXAM: VS:  BP 126/64   Pulse 92   Ht 5\' 6"  (1.676 m)   Wt 143 lb 8 oz (65.1 kg)   SpO2 98%   BMI 23.16 kg/m  , BMI Body mass index is 23.16 kg/m. GENERAL:  Well appearing HEENT:  Pupils equal round and reactive, fundi not visualized, oral mucosa unremarkable NECK:  No jugular venous distention, waveform within normal limits, carotid upstroke brisk and symmetric, no bruits, no thyromegaly LYMPHATICS:  No cervical, inguinal adenopathy LUNGS:  Clear to auscultation bilaterally BACK:  No CVA tenderness CHEST:  Unremarkable HEART:  PMI not displaced or sustained,S1 and S2 within normal limits, no S3, no S4, no clicks, no rubs, no murmurs ABD:  Flat, positive bowel sounds normal in frequency in pitch, no bruits, no rebound, no guarding, no midline pulsatile mass, no hepatomegaly,  no splenomegaly EXT:  2 plus pulses throughout, no edema, no cyanosis no clubbing SKIN:  No rashes no nodules NEURO:  Cranial nerves II through XII grossly intact, motor grossly intact throughout PSYCH:  Cognitively intact, oriented to person place and time    EKG:  EKG is not ordered today. The ekg ordered 08/28/18 demonstrates NSR rate 92 axis WNL, no acute ST T wave changes.    Recent Labs: 03/13/2018: TSH 0.795 10/31/2018: ALT 19; BUN 33; Creatinine 1.27; Hemoglobin 10.4; Platelet Count 165; Potassium 3.9; Sodium 141    Lipid Panel    Component Value Date/Time   CHOL 205 (H) 11/15/2017 1438   TRIG 85 11/15/2017 1438   HDL 71 11/15/2017 1438   CHOLHDL 2.9 11/15/2017 1438   CHOLHDL 3.1 11/28/2012 0500   VLDL 15 11/28/2012 0500   LDLCALC 117 (H) 11/15/2017 1438      Wt Readings from Last 3 Encounters:  11/25/18 143 lb 8 oz (65.1 kg)  11/04/18 147 lb 14.4 oz (67.1 kg)  08/28/18 149 lb 12.8 oz (67.9 kg)      Other studies Reviewed: Additional studies/ records that were reviewed today include: CT, echo 2014 and [place] 2014 Review of the above records demonstrates:  Please see elsewhere in the note.     ASSESSMENT AND PLAN:  CORONARY CALCIUM: She does have some dyspnea and noted coronary calcium.  She needs stress testing but would be able walk on a treadmill.  Therefore, she will have a [employer].  She is going to start aspirin.  Her Framingham risk score was 6.3%  DYSPNEA: This is probably multifactorial.  I will check a BNP level and have a low threshold for echocardiography.  DYSLIPIDEMIA: She should probably be on a statin.  I will repeat a lipid profile and have a low threshold to start this pending those results.     Current medicines are reviewed at length with the patient today.  The patient does not have concerns regarding medicines.  The following changes have been made:  no change  Labs/ tests ordered today include:   Orders Placed This  Encounter  Procedures  . Lipid panel  . B Nat Peptide  . MYOCARDIAL PERFUSION IMAGING     Disposition:   FU with after the above testing.      Signed, Minus Breeding, MD  11/25/2018 8:12 PM    [facility]

## 2018-11-25 NOTE — Patient Instructions (Signed)
Medication Instructions:  START- Aspirin 81 mg daily   If you need a refill on your cardiac medications before your next appointment, please call your pharmacy.  Labwork: Lipid and BNP HERE IN OUR OFFICE AT [facility]  You will need to fast. DO NOT EAT OR DRINK PAST MIDNIGHT.     You will NOT need to fast   Take the provided lab slips with you to the lab for your blood draw.   When you have your labs (blood work) drawn today and your tests are completely normal, you will receive your results only by MyChart Message (if you have MyChart) -OR-  A paper copy in the mail.  If you have any lab test that is abnormal or we need to change your treatment, we will call you to review these results.  Testing/Procedures: Your physician has requested that you have a lexiscan myoview. For further information please visit [email or web address]. Please follow instruction sheet, as given.   Follow-Up: You will need a follow up appointment in 1 Year.  Please call our office 2 months in advance to schedule this appointment.  You may see Dr Percival Spanish or one of the following Advanced Practice Providers on your designated Care Team:   Rosaria Ferries, PA-C . Jory Sims, DNP, ANP   At [facility], you and your health needs are our priority.  As part of our continuing mission to provide you with exceptional heart care, we have created designated Provider Care Teams.  These Care Teams include your primary Cardiologist (physician) and Advanced Practice Providers (APPs -  Physician Assistants and Nurse Practitioners) who all work together to provide you with the care you need, when you need it.  Thank you for choosing [facility] at [facility]!!

## 2018-11-28 ENCOUNTER — Encounter (HOSPITAL_COMMUNITY): Payer: Medicare Other

## 2018-12-03 DIAGNOSIS — K5792 Diverticulitis of intestine, part unspecified, without perforation or abscess without bleeding: Secondary | ICD-10-CM | POA: Diagnosis not present

## 2018-12-03 DIAGNOSIS — I10 Essential (primary) hypertension: Secondary | ICD-10-CM | POA: Diagnosis not present

## 2018-12-03 DIAGNOSIS — R197 Diarrhea, unspecified: Secondary | ICD-10-CM | POA: Diagnosis not present

## 2018-12-03 DIAGNOSIS — K529 Noninfective gastroenteritis and colitis, unspecified: Secondary | ICD-10-CM | POA: Diagnosis not present

## 2018-12-04 ENCOUNTER — Encounter (HOSPITAL_COMMUNITY): Payer: Self-pay

## 2018-12-04 ENCOUNTER — Inpatient Hospital Stay (HOSPITAL_COMMUNITY)
Admission: EM | Admit: 2018-12-04 | Discharge: 2018-12-06 | DRG: 392 | Disposition: A | Discharge status: Home / Self Care | Payer: Medicare Other | Attending: Internal Medicine | Admitting: Internal Medicine

## 2018-12-04 ENCOUNTER — Other Ambulatory Visit: Payer: Self-pay

## 2018-12-04 ENCOUNTER — Observation Stay (HOSPITAL_COMMUNITY): Payer: Medicare Other

## 2018-12-04 ENCOUNTER — Emergency Department (HOSPITAL_COMMUNITY): Payer: Medicare Other

## 2018-12-04 DIAGNOSIS — M797 Fibromyalgia: Secondary | ICD-10-CM | POA: Diagnosis present

## 2018-12-04 DIAGNOSIS — N289 Disorder of kidney and ureter, unspecified: Secondary | ICD-10-CM

## 2018-12-04 DIAGNOSIS — Z8543 Personal history of malignant neoplasm of ovary: Secondary | ICD-10-CM

## 2018-12-04 DIAGNOSIS — Z87891 Personal history of nicotine dependence: Secondary | ICD-10-CM

## 2018-12-04 DIAGNOSIS — E039 Hypothyroidism, unspecified: Secondary | ICD-10-CM | POA: Diagnosis present

## 2018-12-04 DIAGNOSIS — Z818 Family history of other mental and behavioral disorders: Secondary | ICD-10-CM

## 2018-12-04 DIAGNOSIS — R112 Nausea with vomiting, unspecified: Secondary | ICD-10-CM | POA: Diagnosis not present

## 2018-12-04 DIAGNOSIS — L89152 Pressure ulcer of sacral region, stage 2: Secondary | ICD-10-CM | POA: Diagnosis present

## 2018-12-04 DIAGNOSIS — Z79899 Other long term (current) drug therapy: Secondary | ICD-10-CM

## 2018-12-04 DIAGNOSIS — Z888 Allergy status to other drugs, medicaments and biological substances status: Secondary | ICD-10-CM

## 2018-12-04 DIAGNOSIS — L8990 Pressure ulcer of unspecified site, unspecified stage: Secondary | ICD-10-CM | POA: Diagnosis present

## 2018-12-04 DIAGNOSIS — K529 Noninfective gastroenteritis and colitis, unspecified: Secondary | ICD-10-CM | POA: Diagnosis not present

## 2018-12-04 DIAGNOSIS — Z79891 Long term (current) use of opiate analgesic: Secondary | ICD-10-CM

## 2018-12-04 DIAGNOSIS — L89302 Pressure ulcer of unspecified buttock, stage 2: Secondary | ICD-10-CM | POA: Diagnosis not present

## 2018-12-04 DIAGNOSIS — Z981 Arthrodesis status: Secondary | ICD-10-CM

## 2018-12-04 DIAGNOSIS — R634 Abnormal weight loss: Secondary | ICD-10-CM

## 2018-12-04 DIAGNOSIS — E876 Hypokalemia: Secondary | ICD-10-CM | POA: Diagnosis present

## 2018-12-04 DIAGNOSIS — R197 Diarrhea, unspecified: Secondary | ICD-10-CM | POA: Diagnosis not present

## 2018-12-04 DIAGNOSIS — Z801 Family history of malignant neoplasm of trachea, bronchus and lung: Secondary | ICD-10-CM

## 2018-12-04 DIAGNOSIS — R4182 Altered mental status, unspecified: Secondary | ICD-10-CM | POA: Diagnosis not present

## 2018-12-04 DIAGNOSIS — R5381 Other malaise: Secondary | ICD-10-CM | POA: Diagnosis present

## 2018-12-04 DIAGNOSIS — N189 Chronic kidney disease, unspecified: Secondary | ICD-10-CM | POA: Diagnosis not present

## 2018-12-04 DIAGNOSIS — E440 Moderate protein-calorie malnutrition: Secondary | ICD-10-CM | POA: Diagnosis not present

## 2018-12-04 DIAGNOSIS — G40909 Epilepsy, unspecified, not intractable, without status epilepticus: Secondary | ICD-10-CM

## 2018-12-04 DIAGNOSIS — G4730 Sleep apnea, unspecified: Secondary | ICD-10-CM | POA: Diagnosis present

## 2018-12-04 DIAGNOSIS — K7689 Other specified diseases of liver: Secondary | ICD-10-CM | POA: Diagnosis not present

## 2018-12-04 DIAGNOSIS — Z79890 Hormone replacement therapy (postmenopausal): Secondary | ICD-10-CM

## 2018-12-04 DIAGNOSIS — R000 Tachycardia, unspecified: Secondary | ICD-10-CM | POA: Diagnosis not present

## 2018-12-04 DIAGNOSIS — G894 Chronic pain syndrome: Secondary | ICD-10-CM | POA: Diagnosis not present

## 2018-12-04 DIAGNOSIS — I509 Heart failure, unspecified: Secondary | ICD-10-CM | POA: Diagnosis present

## 2018-12-04 DIAGNOSIS — Z85820 Personal history of malignant melanoma of skin: Secondary | ICD-10-CM

## 2018-12-04 DIAGNOSIS — Z90710 Acquired absence of both cervix and uterus: Secondary | ICD-10-CM

## 2018-12-04 DIAGNOSIS — E860 Dehydration: Secondary | ICD-10-CM | POA: Diagnosis not present

## 2018-12-04 DIAGNOSIS — Z85118 Personal history of other malignant neoplasm of bronchus and lung: Secondary | ICD-10-CM

## 2018-12-04 DIAGNOSIS — R296 Repeated falls: Secondary | ICD-10-CM | POA: Diagnosis present

## 2018-12-04 DIAGNOSIS — Z800 Family history of malignant neoplasm of digestive organs: Secondary | ICD-10-CM

## 2018-12-04 DIAGNOSIS — Z85528 Personal history of other malignant neoplasm of kidney: Secondary | ICD-10-CM

## 2018-12-04 DIAGNOSIS — N179 Acute kidney failure, unspecified: Secondary | ICD-10-CM | POA: Diagnosis not present

## 2018-12-04 DIAGNOSIS — Z96653 Presence of artificial knee joint, bilateral: Secondary | ICD-10-CM | POA: Diagnosis present

## 2018-12-04 DIAGNOSIS — Z905 Acquired absence of kidney: Secondary | ICD-10-CM

## 2018-12-04 DIAGNOSIS — R918 Other nonspecific abnormal finding of lung field: Secondary | ICD-10-CM | POA: Diagnosis not present

## 2018-12-04 DIAGNOSIS — Z880 Allergy status to penicillin: Secondary | ICD-10-CM

## 2018-12-04 LAB — URINALYSIS, ROUTINE W REFLEX MICROSCOPIC
Bilirubin Urine: NEGATIVE
Glucose, UA: NEGATIVE mg/dL
Hgb urine dipstick: NEGATIVE
Ketones, ur: NEGATIVE mg/dL
Leukocytes,Ua: NEGATIVE
Nitrite: NEGATIVE
Protein, ur: NEGATIVE mg/dL
Specific Gravity, Urine: 1.011 (ref 1.005–1.030)
pH: 5 (ref 5.0–8.0)

## 2018-12-04 LAB — CBC
HCT: 34.9 % — ABNORMAL LOW (ref 36.0–46.0)
Hemoglobin: 11.4 g/dL — ABNORMAL LOW (ref 12.0–15.0)
MCH: 29.7 pg (ref 26.0–34.0)
MCHC: 32.7 g/dL (ref 30.0–36.0)
MCV: 90.9 fL (ref 80.0–100.0)
Platelets: 197 10*3/uL (ref 150–400)
RBC: 3.84 MIL/uL — ABNORMAL LOW (ref 3.87–5.11)
RDW: 15.9 % — ABNORMAL HIGH (ref 11.5–15.5)
WBC: 16 10*3/uL — ABNORMAL HIGH (ref 4.0–10.5)
nRBC: 0 % (ref 0.0–0.2)

## 2018-12-04 LAB — COMPREHENSIVE METABOLIC PANEL
ALT: 19 U/L (ref 0–44)
AST: 18 U/L (ref 15–41)
Albumin: 3 g/dL — ABNORMAL LOW (ref 3.5–5.0)
Alkaline Phosphatase: 62 U/L (ref 38–126)
Anion gap: 13 (ref 5–15)
BUN: 109 mg/dL — ABNORMAL HIGH (ref 8–23)
CO2: 20 mmol/L — ABNORMAL LOW (ref 22–32)
Calcium: 8.4 mg/dL — ABNORMAL LOW (ref 8.9–10.3)
Chloride: 101 mmol/L (ref 98–111)
Creatinine, Ser: 2.29 mg/dL — ABNORMAL HIGH (ref 0.44–1.00)
GFR calc Af Amer: 24 mL/min — ABNORMAL LOW (ref >60)
GFR calc non Af Amer: 21 mL/min — ABNORMAL LOW (ref >60)
Glucose, Bld: 182 mg/dL — ABNORMAL HIGH (ref 70–99)
Potassium: 3.3 mmol/L — ABNORMAL LOW (ref 3.5–5.1)
Sodium: 134 mmol/L — ABNORMAL LOW (ref 135–145)
Total Bilirubin: 0.8 mg/dL (ref 0.3–1.2)
Total Protein: 6.6 g/dL (ref 6.5–8.1)

## 2018-12-04 LAB — RAPID URINE DRUG SCREEN, HOSP PERFORMED
Amphetamines: NOT DETECTED
Barbiturates: NOT DETECTED
Benzodiazepines: NOT DETECTED
Cocaine: NOT DETECTED
Opiates: POSITIVE — AB
Tetrahydrocannabinol: NOT DETECTED

## 2018-12-04 LAB — LACTIC ACID, PLASMA
Lactic Acid, Venous: 0.9 mmol/L (ref 0.5–1.9)
Lactic Acid, Venous: 1.1 mmol/L (ref 0.5–1.9)

## 2018-12-04 LAB — GLUCOSE, CAPILLARY: Glucose-Capillary: 130 mg/dL — ABNORMAL HIGH (ref 70–99)

## 2018-12-04 LAB — LIPASE, BLOOD: Lipase: 26 U/L (ref 11–51)

## 2018-12-04 LAB — PHOSPHORUS: Phosphorus: 4.2 mg/dL (ref 2.5–4.6)

## 2018-12-04 LAB — CREATININE, URINE, RANDOM: Creatinine, Urine: 34.31 mg/dL

## 2018-12-04 LAB — MAGNESIUM: Magnesium: 1.3 mg/dL — ABNORMAL LOW (ref 1.7–2.4)

## 2018-12-04 LAB — SODIUM, URINE, RANDOM: Sodium, Ur: 46 mmol/L

## 2018-12-04 MED ORDER — BUPROPION HCL ER (SR) 150 MG PO TB12
150.0000 mg | ORAL_TABLET | Freq: Every day | ORAL | Status: DC
  Administered 2018-12-05 – 2018-12-06 (×2): 150 mg via ORAL
  Filled 2018-12-04 (×2): qty 1

## 2018-12-04 MED ORDER — ONDANSETRON HCL 4 MG PO TABS: 4.0000 mg | ORAL_TABLET | Freq: Four times a day (QID) | ORAL | Status: DC | PRN

## 2018-12-04 MED ORDER — METRONIDAZOLE IN NACL 5-0.79 MG/ML-% IV SOLN
500.0000 mg | Freq: Once | INTRAVENOUS | Status: AC
  Administered 2018-12-04: 500 mg via INTRAVENOUS
  Filled 2018-12-04: qty 100

## 2018-12-04 MED ORDER — ONDANSETRON HCL 4 MG/2ML IJ SOLN: 4.0000 mg | Freq: Four times a day (QID) | INTRAMUSCULAR | Status: DC | PRN

## 2018-12-04 MED ORDER — SODIUM CHLORIDE 0.9 % IV SOLN
INTRAVENOUS | Status: AC
  Administered 2018-12-04: via INTRAVENOUS

## 2018-12-04 MED ORDER — SODIUM CHLORIDE 0.9 % IV SOLN
INTRAVENOUS | Status: DC | PRN
  Administered 2018-12-04: 500 mL via INTRAVENOUS
  Administered 2018-12-05: 1000 mL via INTRAVENOUS

## 2018-12-04 MED ORDER — LEVOTHYROXINE SODIUM 100 MCG PO TABS
100.0000 ug | ORAL_TABLET | Freq: Every day | ORAL | Status: DC
  Administered 2018-12-05 – 2018-12-06 (×2): 100 ug via ORAL
  Filled 2018-12-04 (×2): qty 1

## 2018-12-04 MED ORDER — CIPROFLOXACIN IN D5W 400 MG/200ML IV SOLN
400.0000 mg | Freq: Once | INTRAVENOUS | Status: AC
  Administered 2018-12-04: 400 mg via INTRAVENOUS
  Filled 2018-12-04: qty 200

## 2018-12-04 MED ORDER — CIPROFLOXACIN IN D5W 400 MG/200ML IV SOLN: 400.0000 mg | INTRAVENOUS | Status: DC

## 2018-12-04 MED ORDER — MORPHINE SULFATE ER 30 MG PO TBCR
30.0000 mg | EXTENDED_RELEASE_TABLET | Freq: Two times a day (BID) | ORAL | Status: DC
  Administered 2018-12-05 – 2018-12-06 (×3): 30 mg via ORAL
  Filled 2018-12-04 (×3): qty 1

## 2018-12-04 MED ORDER — MAGNESIUM SULFATE 2 GM/50ML IV SOLN
2.0000 g | Freq: Once | INTRAVENOUS | Status: AC
  Administered 2018-12-04: 2 g via INTRAVENOUS
  Filled 2018-12-04: qty 50

## 2018-12-04 MED ORDER — ONDANSETRON 4 MG PO TBDP
4.0000 mg | ORAL_TABLET | Freq: Once | ORAL | Status: AC | PRN
  Administered 2018-12-04: 4 mg via ORAL
  Filled 2018-12-04: qty 1

## 2018-12-04 MED ORDER — POTASSIUM CHLORIDE 10 MEQ/100ML IV SOLN
10.0000 meq | INTRAVENOUS | Status: AC
  Administered 2018-12-04 – 2018-12-05 (×2): 10 meq via INTRAVENOUS
  Filled 2018-12-04 (×2): qty 100

## 2018-12-04 MED ORDER — SODIUM CHLORIDE 0.9% FLUSH
3.0000 mL | Freq: Once | INTRAVENOUS | Status: AC
  Administered 2018-12-04: 3 mL via INTRAVENOUS

## 2018-12-04 MED ORDER — IOHEXOL 300 MG/ML  SOLN
30.0000 mL | Freq: Once | INTRAMUSCULAR | Status: AC | PRN
  Administered 2018-12-04: 30 mL via ORAL

## 2018-12-04 MED ORDER — ZOLPIDEM TARTRATE 5 MG PO TABS
5.0000 mg | ORAL_TABLET | Freq: Once | ORAL | Status: AC
  Administered 2018-12-04: 5 mg via ORAL
  Filled 2018-12-04: qty 1

## 2018-12-04 MED ORDER — ACETAMINOPHEN 325 MG PO TABS: 650.0000 mg | ORAL_TABLET | Freq: Four times a day (QID) | ORAL | Status: DC | PRN

## 2018-12-04 MED ORDER — HYDROCODONE-ACETAMINOPHEN 5-325 MG PO TABS
1.0000 | ORAL_TABLET | ORAL | Status: DC | PRN
  Administered 2018-12-04 – 2018-12-06 (×3): 2 via ORAL
  Filled 2018-12-04 (×3): qty 2

## 2018-12-04 MED ORDER — ACETAMINOPHEN 650 MG RE SUPP: 650.0000 mg | Freq: Four times a day (QID) | RECTAL | Status: DC | PRN

## 2018-12-04 MED ORDER — METRONIDAZOLE IN NACL 5-0.79 MG/ML-% IV SOLN
500.0000 mg | Freq: Three times a day (TID) | INTRAVENOUS | Status: DC
  Administered 2018-12-05: 500 mg via INTRAVENOUS
  Filled 2018-12-04: qty 100

## 2018-12-04 MED ORDER — TRAMADOL HCL 50 MG PO TABS: 50.0000 mg | ORAL_TABLET | Freq: Two times a day (BID) | ORAL | Status: DC | PRN

## 2018-12-04 MED ORDER — INSULIN ASPART 100 UNIT/ML ~~LOC~~ SOLN
0.0000 [IU] | SUBCUTANEOUS | Status: DC
  Administered 2018-12-04: 1 [IU] via SUBCUTANEOUS

## 2018-12-04 MED ORDER — SODIUM CHLORIDE 0.9 % IV BOLUS
1000.0000 mL | Freq: Once | INTRAVENOUS | Status: AC
  Administered 2018-12-04: 1000 mL via INTRAVENOUS

## 2018-12-04 NOTE — ED Notes (Signed)
Pt now states that she fainted last night and fell off the toilet. She states that her left hip got hurt during her fall.

## 2018-12-04 NOTE — ED Notes (Signed)
Pt is alert and responsive to voice, Pt is oriented x 3 at this time and appears lethargic and is falling asleep during assessment questioning. Pt does  Report lower abdominal pain, pt does report recent "bladder infection" as well as diverticulitis and began ABX therapy yesterday.

## 2018-12-04 NOTE — ED Triage Notes (Signed)
Patient states she went to her PCP yesterday for c/o vomiting and diarrhea x 3-4 days. Patient states she was called today and told to come to the ED due to abnormal labs/dehydration.

## 2018-12-04 NOTE — ED Provider Notes (Signed)
  Physical Exam  BP (!) 132/59 (BP Location: Right Arm)   Pulse 100   Temp 97.6 F (36.4 C)   Resp 16   Ht 5\' 6"  (1.676 m)   Wt 51.3 kg   SpO2 99%   BMI 18.24 kg/m   Physical Exam  ED Course/Procedures     Procedures  MDM  Patient care received from [facility] PA at shift change, please see his note for a full HPI. Signed out received pending CT results and admission, workup remarkable for new AKI, BUN 110, treated with antibiotics by PCP for diverticulitis. CT abdomen w/o contrast showed: 1. Abnormal wall thickening about the mildly dilated transverse and  proximal descending colon, suggesting acute colitis.  2. Mild extrahepatic biliary dilatation, similar relative to  previous exam. No obstructive stone or other finding identified.  Correlation with liver function tests suggested.  3. Moderate aorto bi-iliac atherosclerotic disease.   6:27 PM Ciprofloxacin was ordered and hospitalist consult placed for admission.  6:59 PM Spoke to Dr. Roel Cluck who will admit patient.       Janeece Fitting, PA-C 12/04/18 1901    Drenda Freeze, MD 12/04/18 [phone]

## 2018-12-04 NOTE — H&P (Signed)
Coralynn R Lieber ZHG:[id number] DOB: Nov 15, 1950 DOA: 12/04/2018     PCP: Bernerd Limbo, MD   Outpatient Specialists:      Oncology  Dr. Julien Nordmann    Patient arrived to ER on 12/04/18 at 1110  Patient coming from: home Lives   With family    Chief Complaint:  Chief Complaint  Patient presents with  . Emesis  . Diarrhea  . Abnormal Lab    HPI: [name] is a 68 y.o. female with medical history significant of  lung cancer, kidney cancer status post partial nephrectomy CHF, CKD, fibromyalgia, history of ovarian cancer, hypothyroidism    Presented with   nausea vomiting diarrhea for the past 3 to 4 days she was seen by her primary care provider yesterday and was told to go to emergency department secondary to dehydration and abnormal labs [place] diarrhea is watery nonbloody.  She has been having worsening right-sided abdominal pain.  She is been getting worse over the past 1 week.  Has been lethargic she has hard time tolerating p.o. even water.  Her primary care provider initially provided her with antibiotics thinking it could be diverticulitis because she had history of this in the past.  But she have not had any antibiotics before starting her symptoms. No associated fevers or chills shortness of breath or chest pain but she has been losing some weight for the past few months. Family states she had syncopal episode on the commode yesterday. Reports decreased PO intake No sick contacts, no eating out  More confused lately No longner smokes No EtOH abuse  Family states although she lives with her husband he has not been really helping her much patient states that she has been ambulating prior to recent events.   Regarding pertinent Chronic problems:    Lung Cancer stage 1 sp resection now on observation  While in ER:  The following Work up has been ordered so far:  Orders Placed This Encounter  Procedures  . CT ABDOMEN PELVIS WO CONTRAST  . Lipase, blood  .  Comprehensive metabolic panel  . CBC  . Urinalysis, Routine w reflex microscopic  . Lactic acid, plasma  . Magnesium  . Diet NPO time specified  . Saline Lock IV, Maintain IV access  . Consult to hospitalist  [phone]  . ED EKG  . EKG 12-Lead   Following Medications were ordered in ER: Medications  ciprofloxacin (CIPRO) IVPB 400 mg (has no administration in time range)  sodium chloride flush (NS) 0.9 % injection 3 mL (3 mLs Intravenous Given 12/04/18 1241)  ondansetron (ZOFRAN-ODT) disintegrating tablet 4 mg (4 mg Oral Given 12/04/18 1141)  sodium chloride 0.9 % bolus 1,000 mL (0 mLs Intravenous Stopped 12/04/18 1522)  iohexol (OMNIPAQUE) 300 MG/ML solution 30 mL (30 mLs Oral Contrast Given 12/04/18 1458)    Significant initial  Findings: Abnormal Labs Reviewed  COMPREHENSIVE METABOLIC PANEL - Abnormal; Notable for the following components:      Result Value   Sodium 134 (*)    Potassium 3.3 (*)    CO2 20 (*)    Glucose, Bld 182 (*)    BUN 109 (*)    Creatinine, Ser 2.29 (*)    Calcium 8.4 (*)    Albumin 3.0 (*)    GFR calc non Af Amer 21 (*)    GFR calc Af Amer 24 (*)    All other components within normal limits  CBC - Abnormal; Notable for the following components:  WBC 16.0 (*)    RBC 3.84 (*)    Hemoglobin 11.4 (*)    HCT 34.9 (*)    RDW 15.9 (*)    All other components within normal limits  MAGNESIUM - Abnormal; Notable for the following components:   Magnesium 1.3 (*)    All other components within normal limits     Lactic Acid, Venous    Component Value Date/Time   LATICACIDVEN 0.9 12/04/2018 1308   Lipase 26  Cr    Up from baseline see below Lab Results  Component Value Date   CREATININE 2.29 (H) 12/04/2018   CREATININE 1.27 (H) 10/31/2018   CREATININE 1.45 (H) 07/16/2018     HG/HCT   Stable,     Component Value Date/Time   HGB 11.4 (L) 12/04/2018 1304   HGB 10.4 (L) 10/31/2018 1429   HGB 11.8 03/13/2018 1427   HGB 12.5 03/07/2010 1052   HCT 34.9  (L) 12/04/2018 1304   HCT 35.9 03/13/2018 1427   HCT 36.6 03/07/2010 1052     UA no evidence of UTI       CTabd/pelvis - acute colitis  ECG:  Personally reviewed by me showing: HR :97 Rhythm:  NSR no evidence of ischemic changes QTC 445      ED Triage Vitals  Enc Vitals Group     BP 12/04/18 1126 103/62     Pulse Rate 12/04/18 1126 (!) 115     Resp 12/04/18 1126 17     Temp 12/04/18 1126 97.6 F (36.4 C)     Temp src --      SpO2 12/04/18 1126 100 %     Weight 12/04/18 1136 113 lb (51.3 kg)     Height 12/04/18 1136 5\' 6"  (1.676 m)     Head Circumference --      Peak Flow --      Pain Score 12/04/18 1136 0     Pain Loc --      Pain Edu? --      Excl. in [place]? --   TMAX(24)@       Latest  Blood pressure (!) 132/59, pulse 100, temperature 97.6 F (36.4 C), resp. rate 16, height 5\' 6"  (1.676 m), weight 51.3 kg, SpO2 99 %.     Hospitalist was called for admission for GI secondary to dehydration in the setting of colitis   Review of Systems:    Pertinent positives include:  fatigue, weight loss abdominal pain, nausea, vomiting, diarrhea,  Constitutional:  No weight loss, night sweats, Fevers, chills,  HEENT:  No headaches, Difficulty swallowing,Tooth/dental problems,Sore throat,  No sneezing, itching, ear ache, nasal congestion, post nasal drip,  Cardio-vascular:  No chest pain, Orthopnea, PND, anasarca, dizziness, palpitations.no Bilateral lower extremity swelling  GI:  No heartburn, indigestion,  change in bowel habits, loss of appetite, melena, blood in stool, hematemesis Resp:  no shortness of breath at rest. No dyspnea on exertion, No excess mucus, no productive cough, No non-productive cough, No coughing up of blood.No change in color of mucus.No wheezing. Skin:  no rash or lesions. No jaundice GU:  no dysuria, change in color of urine, no urgency or frequency. No straining to urinate.  No flank pain.  Musculoskeletal:  No joint pain or no joint  swelling. No decreased range of motion. No back pain.  Psych:  No change in mood or affect. No depression or anxiety. No memory loss.  Neuro: no localizing neurological complaints, no tingling, no weakness, no double  vision, no gait abnormality, no slurred speech, no confusion  All systems reviewed and apart from [place] all are negative  Past Medical History:   Past Medical History:  Diagnosis Date  . Anemia   . Anxiety   . Arthritis    "everywhere" (08/19/2013)  . Bilateral carpal tunnel syndrome   . Chest pain at rest, rule out pericarditis 11/27/2012  . CHF (congestive heart failure) ([place])   . Chronic kidney disease    dr Risa Grill  . Chronic lower back pain   . Complication of anesthesia    "woke up twice during knee replacement" (08/19/2013)  . Depression   . Dyspnea    w/ exertion    . Fibromyalgia   . Headache(784.0)    hx of migraines   . History of pericarditis, with pericardial window in 2009 11/27/2012  . Hx of ovarian cancer 11/27/2012  . Hypothyroidism 11/27/2012  . Iron deficiency anemia   . Kidney carcinoma ([place])    "both kidneys; ~ 3 yr apart" (08/19/2013)  . Lung cancer ([place]) 2018  . Melanoma of back ([place])   . Migraines   . Pneumonia    "several times; including today", RUL/notes 08/19/2013  . Squamous carcinoma    "face, side of my nose, chest; used acid to get rid of them" (08/19/2013)  . Venous insufficiency 11/27/2012      Past Surgical History:  Procedure Laterality Date  . ABDOMINAL HYSTERECTOMY  1979  . ANTERIOR LAT LUMBAR FUSION Left 07/16/2018   Procedure: Left Lumbar two-three  Anterolateral decompression/interbody fusion with lateral plate fixation;  Surgeon: Kristeen Miss, MD;  Location: [place];  Service: Neurosurgery;  Laterality: Left;  . APPENDECTOMY    . DOPPLER ECHOCARDIOGRAPHY  02/23/2011   LVEF>50%  . FRACTURE SURGERY    . KNEE ARTHROSCOPY Right    "twice before the replacement" (08/19/2013)  . LEFT OOPHORECTOMY Left 1979  . [place]   "ruptured disc"  . MELANOMA EXCISION  ~ 2010   "my lower back" (08/19/2013)  . ORIF HIP FRACTURE Left    x 2 age 12 and 4  . PARTIAL NEPHRECTOMY Right 2005; ~ 2008  . PERICARDIAL WINDOW  ~ 2012  . POSTERIOR LUMBAR FUSION  2002?; 03/2012  . REDUCTION MAMMAPLASTY Bilateral ~ 2008  . RIGHT OOPHORECTOMY  ~ 2007   "it had cancer in it" (08/19/2013)  . SHOULDER ARTHROSCOPY W/ ROTATOR CUFF REPAIR Right 1990's  . TONSILLECTOMY  1954  . TOTAL KNEE ARTHROPLASTY Right 1990's  . TOTAL KNEE ARTHROPLASTY Left 08/02/2015   Procedure: LEFT TOTAL KNEE ARTHROPLASTY;  Surgeon: Paralee Cancel, MD;  Location: [facility];  Service: Orthopedics;  Laterality: Left;  . TOTAL SHOULDER REPLACEMENT Left 2006?  . TUBAL LIGATION  1952  . VIDEO ASSISTED THORACOSCOPY (VATS)/WEDGE RESECTION Right 09/28/2017   Procedure: RIGHT VIDEO ASSISTED THORACOSCOPY (VATS)/WEDGE RESECTION, LYMPH NODE DISSECTION ;  Surgeon: Melrose Nakayama, MD;  Location: [facility];  Service: Thoracic;  Laterality: Right;    Social History:  Ambulatory  Independently     reports that she quit smoking about 33 years ago. Her smoking use included cigarettes. She has a 27.00 pack-year smoking history. She has never used smokeless tobacco. She reports that she does not drink alcohol or use drugs.     Family History:   Family History  Problem Relation Age of Onset  . Cancer Mother 58       Stomach  . Cancer Father 56  Lung cancer  . Bipolar disorder Sister     Allergies: Allergies  Allergen Reactions  . Amoxicillin Other (See Comments)    UNSPECIFIED REACTION  Has patient had a PCN reaction causing immediate rash, facial/tongue/throat swelling, SOB or lightheadedness with hypotension: No Has patient had a PCN reaction causing severe rash involving mucus membranes or skin necrosis: No Has patient had a PCN reaction that required hospitalization No Has patient had a PCN reaction occurring within the last 10 years: No If  all of the above answers are "NO", then may proceed with Cephalosporin use.  . Ampicillin Rash and Other (See Comments)    Has patient had a PCN reaction causing immediate rash, facial/tongue/throat swelling, SOB or lightheadedness with hypotension: No Has patient had a PCN reaction causing severe rash involving mucus membranes or skin necrosis: No Has patient had a PCN reaction that required hospitalization No Has patient had a PCN reaction occurring within the last 10 years: No If all of the above answers are "NO", then may proceed with Cephalosporin use.  [place] [Buspirone] Nausea And Vomiting     Prior to Admission medications   Medication Sig Start Date End Date Taking? Authorizing Provider  Acetaminophen (TYLENOL PO) Take 2 tablets by mouth daily as needed (pain).   Yes [provider]  amLODipine (NORVASC) 10 MG tablet Take 10 mg by mouth daily. 12/02/18  Yes [provider]  buPROPion (WELLBUTRIN SR) 150 MG 12 hr tablet Take 1 tablet (150 mg total) by mouth 2 (two) times daily. Patient taking differently: Take 150 mg by mouth daily.  02/20/18  Yes Stallings, Zoe A, MD  ciprofloxacin (CIPRO) 500 MG tablet Take 500 mg by mouth 2 (two) times daily.  12/03/18  Yes [provider]  cyclobenzaprine (FLEXERIL) 5 MG tablet Take 1 tablet (5 mg total) by mouth 3 (three) times daily as needed. For spasms 08/03/15  Yes Babish, Rodman Key, PA-C  diclofenac sodium (VOLTAREN) 1 % GEL Apply 2 g topically 2 (two) times daily as needed (pain).  10/25/16  Yes [provider]  levothyroxine (SYNTHROID, LEVOTHROID) 100 MCG tablet TAKE 1 TABLET BY MOUTH BEFORE BREAKFAST MONDAY-FRIDAY SKIP SATURDAY ANS SUNDAY Patient taking differently: Take 100 mcg by mouth daily.  04/26/18  Yes Stallings, Zoe A, MD  lisinopril (PRINIVIL,ZESTRIL) 10 MG tablet Take 1 tablet by mouth daily. 11/14/18  Yes [provider]  methocarbamol (ROBAXIN) 500 MG tablet Take 1 tablet (500 mg total) by  mouth every 6 (six) hours as needed for muscle spasms. 07/18/18  Yes Kristeen Miss, MD  metroNIDAZOLE (FLAGYL) 250 MG tablet Take 250 mg by mouth 2 (two) times daily. 12/03/18  Yes [provider]  morphine (MS CONTIN) 30 MG 12 hr tablet Take 1 tablet (30 mg total) by mouth every 12 (twelve) hours. 07/18/18  Yes Kristeen Miss, MD  ondansetron (ZOFRAN) 4 MG tablet Take 4 mg by mouth 3 (three) times daily as needed for nausea/vomiting. 12/03/18  Yes [provider]  polyethylene glycol (MIRALAX / GLYCOLAX) packet Take 17 g by mouth 2 (two) times daily. Patient taking differently: Take 17 g by mouth daily as needed for moderate constipation.  08/03/15  Yes Babish, Rodman Key, PA-C  traMADol (ULTRAM) 50 MG tablet Take 1-2 tablets (50-100 mg total) by mouth every 6 (six) hours as needed (mild pain). 10/02/17  Yes Gold, Wayne E, PA-C  triamterene-hydrochlorothiazide (MAXZIDE) 75-50 MG tablet Take 1 tablet by mouth daily.   Yes [provider]  vitamin B-12 (  CYANOCOBALAMIN) 1000 MCG tablet Take 1 tablet by mouth daily.   Yes [provider]  Vitamin D, Ergocalciferol, (DRISDOL) 1.25 MG (50000 UT) CAPS capsule Take 50,000 Units by mouth every 7 (seven) days.  11/15/18 02/01/19 Yes [provider]  zolpidem (AMBIEN) 10 MG tablet Take 10 mg by mouth at bedtime. 12/02/18  Yes [provider]  PROAIR HFA 108 (90 BASE) MCG/ACT inhaler INHALE 2 PUFFS EVERY 4 HOURS AS NEEDED FOR WHEEZING, COUGH OR SHORTNESS OF BREATH Patient not taking: Reported on 12/04/2018    [name], MD  zolpidem (AMBIEN) 5 MG tablet TAKE 1 TABLET BY MOUTH AT BEDTIME AS NEEDED FOR SLEEP. Patient not taking: No sig reported 11/15/14   [name], MD   Physical Exam: Blood pressure (!) 132/59, pulse 100, temperature 97.6 F (36.4 C), resp. rate 16, height 5\' 6"  (1.676 m), weight 51.3 kg, SpO2 99 %. 1. General:  in No  Acute distress   Chronically ill cachectic  acutely ill -appearing 2.  Psychological:  occasionally becomes somewhat somnolent throughout conversation easily arousable and  Oriented 3. Head/ENT:   Dry Mucous Membranes                          Head Non traumatic, neck supple                           Poor Dentition                         Abrasion of upper lip 4. SKIN:   decreased Skin turgor,  Skin clean Dry and intact no rash 5. Heart: Regular rate and rhythm no Murmur, no Rub or gallop 6. Lungs: Clear to auscultation bilaterally,   7. Abdomen: Soft,  non-tender, Non distended bowel sounds present 8. Lower extremities: no clubbing, cyanosis, no edema 9. Neurologically Grossly intact, moving all 4 extremities equally  10. MSK: Normal range of motion   LABS:     Recent Labs  Lab 12/04/18 1304  WBC 16.0*  HGB 11.4*  HCT 34.9*  MCV 90.9  PLT 488   Basic Metabolic Panel: Recent Labs  Lab 12/04/18 1304 12/04/18 1309  NA 134*  --   K 3.3*  --   CL 101  --   CO2 20*  --   GLUCOSE 182*  --   BUN 109*  --   CREATININE 2.29*  --   CALCIUM 8.4*  --   MG  --  1.3*      Recent Labs  Lab 12/04/18 1304  AST 18  ALT 19  ALKPHOS 62  BILITOT 0.8  PROT 6.6  ALBUMIN 3.0*   Recent Labs  Lab 12/04/18 1304  LIPASE 26   No results for input(s): AMMONIA in the last 168 hours.    HbA1C: No results for input(s): HGBA1C in the last 72 hours. CBG: No results for input(s): GLUCAP in the last 168 hours.    Urine analysis:    Component Value Date/Time   COLORURINE YELLOW 12/04/2018 1243   APPEARANCEUR CLEAR 12/04/2018 1243   LABSPEC 1.011 12/04/2018 1243   PHURINE 5.0 12/04/2018 1243   GLUCOSEU NEGATIVE 12/04/2018 1243   HGBUR NEGATIVE 12/04/2018 1243   BILIRUBINUR NEGATIVE 12/04/2018 [place] 12/04/2018 1243   PROTEINUR NEGATIVE 12/04/2018 1243   UROBILINOGEN 1.0 07/29/2015 0939   NITRITE NEGATIVE 12/04/2018 1243  LEUKOCYTESUR NEGATIVE 12/04/2018 1243       Cultures:    Component Value Date/Time   SDES URINE,  RANDOM 08/21/2013 0125   SPECREQUEST NONE 08/21/2013 0125   CULT NO GROWTH Performed at [facility] 08/21/2013 0125   REPTSTATUS 08/22/2013 FINAL 08/21/2013 0125     Radiological Exams on Admission: Ct Abdomen Pelvis Wo Contrast  Result Date: 12/04/2018 CLINICAL DATA:  Initial evaluation for acute generalized abdominal pain, vomiting, diarrhea. EXAM: CT ABDOMEN AND PELVIS WITHOUT CONTRAST TECHNIQUE: Multidetector CT imaging of the abdomen and pelvis was performed following the standard protocol without IV contrast. COMPARISON:  Prior CT from 08/24/2017 FINDINGS: Lower chest: Visualized lung bases are clear. Hepatobiliary: Limited noncontrast evaluation of the liver is unremarkable. Gallbladder contracted without acute finding. Common bile duct dilated up to 10 mm. No visible obstructive stones or other abnormality. This is similar to previous. No appreciable intrahepatic biliary dilatation on this noncontrast examination. Pancreas: Diffuse fatty infiltration the pancreas noted. Pancreas otherwise unremarkable. Spleen: Spleen within normal limits. Adrenals/Urinary Tract: Adrenal glands are normal. Kidneys fairly equal in size. No nephrolithiasis or hydronephrosis. Approximate 14 mm intermediate density cystic lesion at the lower pole left kidney is grossly similar to previous. Fat containing lesion within the interpolar right kidney grossly stable as well. No appreciable hydroureter bladder moderately distended without acute finding. Stomach/Bowel: Stomach within normal limits. No evidence for bowel obstruction. Appendix within normal limits. Mild diffuse gaseous distension of the transverse colon. Associated wall thickening about the transverse and proximal descending colon, suggesting possible acute colitis. No other significant inflammatory changes about the bowels. Vascular/Lymphatic: Moderate aorto bi-iliac atherosclerotic disease. No aneurysm. Shotty subcentimeter retroperitoneal lymph nodes  noted. No pathologically enlarged intra-abdominal or pelvic lymph nodes identified. Reproductive: Uterus is absent.  Ovaries not confidently identified. Other: No free air or fluid. Musculoskeletal: No acute osseous abnormality. No discrete lytic or blastic osseous lesions. Extensive postoperative changes noted within the lumbar spine. Patient status post vertebral augmentation at L1. IMPRESSION: 1. Abnormal wall thickening about the mildly dilated transverse and proximal descending colon, suggesting acute colitis. 2. Mild extrahepatic biliary dilatation, similar relative to previous exam. No obstructive stone or other finding identified. Correlation with liver function tests suggested. 3. Moderate aorto bi-iliac atherosclerotic disease. Electronically Signed   By: Jeannine Boga M.D.   On: 12/04/2018 18:03    Chart has been reviewed    Assessment/Plan  68 y.o. female with medical history significant of  lung cancer, kidney cancer status post partial nephrectomy CHF, CKD, fibromyalgia, history of ovarian cancer, hypothyroidism Admitted for dehydration  Present on Admission: . Colitis-obtain gastric panel.  Given severity of the symptoms will initiate Cipro Flagyl.  Rehydrate and monitor bowel rest . Hypothyroidism - - Check TSH continue home medications at current dose  . DIARRHEA-PRESUMED INFECTIOUS secondary to colitis.  No prior use of antibiotics will obtain gastric panel hold off on C. difficile testing at this time . Apnea, sleep does not use CPAP . Chronic pain syndrome -continue home medications if able to tolerate . Hypokalemia replace and check magnesium level monitor on telemetry electrolyte abnormalities . Hypomagnesemia replace . Dehydration rehydrate and follow fluid status . Weight loss patient multiple history of cancers in the past with decreased p.o. intake Recent fall resulting in facial trauma CT head unremarkable no evidence of intracranial bleeding.  Patient with  overall debility and frequent falls.  Will have PT OT assess Pressure ulcer -wound care consult ordered pressure ulcer none consistent with patient's history of  only being weak for past few days it is possible the patient has been poorly ambulatory for quite some time May need rehab    other plan as per orders.  DVT prophylaxis:  SCD   Code Status:  FULL CODE  as per patient   I had personally discussed CODE STATUS with patient and family   Family Communication:   Family   at  Bedside  plan of care was discussed with   Daughter,   Disposition Plan:      To home once workup is complete and patient is stable                 Would benefit from PT/OT eval prior to DC  Ordered                                 Nutrition    consulted                                     Consults called: none    Admission status:   Obs    Level of care     tele  For 12H       Antrice Pal 12/05/2018, 12:59 AM    [facility]     after 2 AM please page floor coverage PA If 7AM-7PM, please contact the day team taking care of the patient using [email or web address]

## 2018-12-04 NOTE — ED Notes (Signed)
ED TO INPATIENT HANDOFF REPORT  Name/Age/Gender [name] 68 y.o. female  Code Status Code Status History    Date Active Date Inactive Code Status Order ID Comments User Context   07/16/2018 1549 07/18/2018 2035 Full Code [id number]  Kristeen Miss, MD Inpatient   09/28/2017 1418 10/02/2017 1509 Full Code [id number]  Miguel Aschoff Inpatient   08/02/2015 1544 08/03/2015 1927 Full Code [id number]  Norman Herrlich Inpatient   08/19/2013 2023 08/21/2013 1708 Full Code [id number]  Patrecia Pour, MD Inpatient   11/27/2012 1609 11/28/2012 1834 Full Code [id number]  Trinidad Curet, RN Inpatient      Home/SNF/Other Home  Chief Complaint dehydrated  Level of Care/Admitting Diagnosis ED Disposition    ED Disposition Condition [facility] Area: [facility] [100102]  Level of Care: Telemetry [5]  Admit to tele based on following criteria: Other see comments  Comments: electrolyte abnormalities  Diagnosis: Colitis [536144]  Admitting Physician: Toy Baker [[id number]]  Attending Physician: Toy Baker [[id number]]  PT Class (Do Not Modify): Observation [104]  PT Acc Code (Do Not Modify): Observation [10022]       Medical History Past Medical History:  Diagnosis Date  . Anemia   . Anxiety   . Arthritis    "everywhere" (08/19/2013)  . Bilateral carpal tunnel syndrome   . Chest pain at rest, rule out pericarditis 11/27/2012  . CHF (congestive heart failure) ([place])   . Chronic kidney disease    dr Risa Grill  . Chronic lower back pain   . Complication of anesthesia    "woke up twice during knee replacement" (08/19/2013)  . Depression   . Dyspnea    w/ exertion    . Fibromyalgia   . Headache(784.0)    hx of migraines   . History of pericarditis, with pericardial window in 2009 11/27/2012  . Hx of ovarian cancer 11/27/2012  . Hypothyroidism 11/27/2012  . Iron deficiency anemia   . Kidney carcinoma ([place])    "both kidneys; ~ 3 yr apart"  (08/19/2013)  . Lung cancer ([place]) 2018  . Melanoma of back ([place])   . Migraines   . Pneumonia    "several times; including today", RUL/notes 08/19/2013  . Squamous carcinoma    "face, side of my nose, chest; used acid to get rid of them" (08/19/2013)  . Venous insufficiency 11/27/2012    Allergies Allergies  Allergen Reactions  . Amoxicillin Other (See Comments)    UNSPECIFIED REACTION  Has patient had a PCN reaction causing immediate rash, facial/tongue/throat swelling, SOB or lightheadedness with hypotension: No Has patient had a PCN reaction causing severe rash involving mucus membranes or skin necrosis: No Has patient had a PCN reaction that required hospitalization No Has patient had a PCN reaction occurring within the last 10 years: No If all of the above answers are "NO", then may proceed with Cephalosporin use.  . Ampicillin Rash and Other (See Comments)    Has patient had a PCN reaction causing immediate rash, facial/tongue/throat swelling, SOB or lightheadedness with hypotension: No Has patient had a PCN reaction causing severe rash involving mucus membranes or skin necrosis: No Has patient had a PCN reaction that required hospitalization No Has patient had a PCN reaction occurring within the last 10 years: No If all of the above answers are "NO", then may proceed with Cephalosporin use.  Ebbie Ridge [Buspirone] Nausea And Vomiting    IV Location/Drains/Wounds Patient Lines/Drains/Airways Status   Active Line/Drains/Airways  Name:   Placement date:   Placement time:   Site:   Days:   Peripheral IV 12/04/18 Left Antecubital   12/04/18    1240    Antecubital   less than 1   Incision (Closed) 09/28/17 Chest Right   09/28/17    1048     432   Incision (Closed) 09/28/17 Chest   09/28/17    1048     432   Incision (Closed) 07/16/18 Flank Other (Comment)   07/16/18    1254     141   Incision (Closed) 07/16/18 Flank Left   07/16/18    1353     141           Labs/Imaging Results for orders placed or performed during the hospital encounter of 12/04/18 (from the past 48 hour(s))  Urinalysis, Routine w reflex microscopic     Status: None   Collection Time: 12/04/18 12:43 PM  Result Value Ref Range   Color, Urine YELLOW YELLOW   APPearance CLEAR CLEAR   Specific Gravity, Urine 1.011 1.005 - 1.030   pH 5.0 5.0 - 8.0   Glucose, UA NEGATIVE NEGATIVE mg/dL   Hgb urine dipstick NEGATIVE NEGATIVE   Bilirubin Urine NEGATIVE NEGATIVE   Ketones, ur NEGATIVE NEGATIVE mg/dL   Protein, ur NEGATIVE NEGATIVE mg/dL   Nitrite NEGATIVE NEGATIVE   Leukocytes,Ua NEGATIVE NEGATIVE    Comment: Performed at [facility], [place] [address] [place], [place] [address]  Creatinine, urine, random     Status: None   Collection Time: 12/04/18 12:43 PM  Result Value Ref Range   Creatinine, Urine 34.31 mg/dL    Comment: Performed at [facility], [place] [address], [place], [place] [address]  Sodium, urine, random     Status: None   Collection Time: 12/04/18 12:43 PM  Result Value Ref Range   Sodium, Ur 46 mmol/L    Comment: Performed at [facility], [place] [address] [place], [place] [address]  Lipase, blood     Status: None   Collection Time: 12/04/18  1:04 PM  Result Value Ref Range   Lipase 26 11 - 51 U/L    Comment: Performed at [facility], [place] [address], [place], [place] [address]  Comprehensive metabolic panel     Status: Abnormal   Collection Time: 12/04/18  1:04 PM  Result Value Ref Range   Sodium 134 (L) 135 - 145 mmol/L   Potassium 3.3 (L) 3.5 - 5.1 mmol/L   Chloride 101 98 - 111 mmol/L   CO2 20 (L) 22 - 32 mmol/L   Glucose, Bld 182 (H) 70 - 99 mg/dL   BUN 109 (H) 8 - 23 mg/dL    Comment: RESULTS CONFIRMED BY MANUAL DILUTION   Creatinine, Ser 2.29 (H) 0.44 - 1.00 mg/dL   Calcium 8.4 (L) 8.9 - 10.3 mg/dL   Total Protein 6.6 6.5 - 8.1 g/dL   Albumin 3.0 (L) 3.5 - 5.0 g/dL    AST 18 15 - 41 U/L   ALT 19 0 - 44 U/L   Alkaline Phosphatase 62 38 - 126 U/L   Total Bilirubin 0.8 0.3 - 1.2 mg/dL   GFR calc non Af Amer 21 (L) >60 mL/min   GFR calc Af Amer 24 (L) >60 mL/min   Anion gap 13 5 - 15    Comment: Performed at [facility], [place] [address], [place],  [address]  CBC  Status: Abnormal   Collection Time: 12/04/18  1:04 PM  Result Value Ref Range   WBC 16.0 (H) 4.0 - 10.5 K/uL   RBC 3.84 (L) 3.87 - 5.11 MIL/uL   Hemoglobin 11.4 (L) 12.0 - 15.0 g/dL   HCT 34.9 (L) 36.0 - 46.0 %   MCV 90.9 80.0 - 100.0 fL   MCH 29.7 26.0 - 34.0 pg   MCHC 32.7 30.0 - 36.0 g/dL   RDW 15.9 (H) 11.5 - 15.5 %   Platelets 197 150 - 400 K/uL   nRBC 0.0 0.0 - 0.2 %    Comment: Performed at [facility], [place] [address] [place], [place] [address]  Lactic acid, plasma     Status: None   Collection Time: 12/04/18  1:08 PM  Result Value Ref Range   Lactic Acid, Venous 0.9 0.5 - 1.9 mmol/L    Comment: Performed at [facility], [place] [address], [place], [place] [address]  Magnesium     Status: Abnormal   Collection Time: 12/04/18  1:09 PM  Result Value Ref Range   Magnesium 1.3 (L) 1.7 - 2.4 mg/dL    Comment: Performed at [facility], [place] [address] [place], [place] [address]  Phosphorus     Status: None   Collection Time: 12/04/18  1:09 PM  Result Value Ref Range   Phosphorus 4.2 2.5 - 4.6 mg/dL    Comment: Performed at [facility], [place] [address], [place], [place] [address]   Ct Abdomen Pelvis Wo Contrast  Result Date: 12/04/2018 CLINICAL DATA:  Initial evaluation for acute generalized abdominal pain, vomiting, diarrhea. EXAM: CT ABDOMEN AND PELVIS WITHOUT CONTRAST TECHNIQUE: Multidetector CT imaging of the abdomen and pelvis was performed following the standard protocol without IV contrast. COMPARISON:  Prior CT from 08/24/2017 FINDINGS: Lower chest: Visualized lung bases  are clear. Hepatobiliary: Limited noncontrast evaluation of the liver is unremarkable. Gallbladder contracted without acute finding. Common bile duct dilated up to 10 mm. No visible obstructive stones or other abnormality. This is similar to previous. No appreciable intrahepatic biliary dilatation on this noncontrast examination. Pancreas: Diffuse fatty infiltration the pancreas noted. Pancreas otherwise unremarkable. Spleen: Spleen within normal limits. Adrenals/Urinary Tract: Adrenal glands are normal. Kidneys fairly equal in size. No nephrolithiasis or hydronephrosis. Approximate 14 mm intermediate density cystic lesion at the lower pole left kidney is grossly similar to previous. Fat containing lesion within the interpolar right kidney grossly stable as well. No appreciable hydroureter bladder moderately distended without acute finding. Stomach/Bowel: Stomach within normal limits. No evidence for bowel obstruction. Appendix within normal limits. Mild diffuse gaseous distension of the transverse colon. Associated wall thickening about the transverse and proximal descending colon, suggesting possible acute colitis. No other significant inflammatory changes about the bowels. Vascular/Lymphatic: Moderate aorto bi-iliac atherosclerotic disease. No aneurysm. Shotty subcentimeter retroperitoneal lymph nodes noted. No pathologically enlarged intra-abdominal or pelvic lymph nodes identified. Reproductive: Uterus is absent.  Ovaries not confidently identified. Other: No free air or fluid. Musculoskeletal: No acute osseous abnormality. No discrete lytic or blastic osseous lesions. Extensive postoperative changes noted within the lumbar spine. Patient status post vertebral augmentation at L1. IMPRESSION: 1. Abnormal wall thickening about the mildly dilated transverse and proximal descending colon, suggesting acute colitis. 2. Mild extrahepatic biliary dilatation, similar relative to previous exam. No obstructive stone or  other finding identified. Correlation with liver function tests suggested. 3. Moderate aorto bi-iliac atherosclerotic disease. Electronically Signed   By: Pincus Badder.D.  On: 12/04/2018 18:03   Dg Chest 2 View  Result Date: 12/04/2018 CLINICAL DATA:  Vomiting and diarrhea for 3-4 days, dehydration, former smoker, CHF, history of bearing cancer EXAM: CHEST - 2 VIEW COMPARISON:  08/28/2018 FINDINGS: Normal heart size, mediastinal contours, and pulmonary vascularity. Postsurgical changes RIGHT upper lobe. Atherosclerotic calcification aorta. Lungs appear hyperinflated but otherwise clear. No infiltrate, pleural effusion, or pneumothorax. Bones demineralized with note of a LEFT shoulder prosthesis and chronic RIGHT rotator cuff tear. IMPRESSION: Hyperinflated lungs with postsurgical changes in RIGHT upper lobe. No acute abnormalities. Electronically Signed   By: Lavonia Dana M.D.   On: 12/04/2018 19:45   Ct Head Wo Contrast  Result Date: 12/04/2018 CLINICAL DATA:  Lethargic. Altered mental status. EXAM: CT HEAD WITHOUT CONTRAST TECHNIQUE: Contiguous axial images were obtained from the base of the skull through the vertex without intravenous contrast. COMPARISON:  CT head 10/06/2013. FINDINGS: Brain: No evidence for acute infarction, hemorrhage, mass lesion, hydrocephalus, or extra-axial fluid. Normal for age cerebral volume. Mild hypoattenuation of white matter, likely mild small vessel disease. Vascular: Calcification of the cavernous internal carotid arteries consistent with cerebrovascular atherosclerotic disease. No signs of intracranial large vessel occlusion. Skull: Calvarium intact Sinuses/Orbits: Negative orbits. No acute finding. Other: None. Compared with priors, similar appearance. IMPRESSION: Mild atrophy and small vessel disease. No acute intracranial findings. Electronically Signed   By: Staci Righter M.D.   On: 12/04/2018 20:53    Pending Labs Unresulted Labs (From admission,  onward)    Start     Ordered   12/04/18 2027  Urine rapid drug screen (hosp performed)  ONCE - STAT,   R     12/04/18 2026   12/04/18 1904  Gastrointestinal Panel by PCR , Stool  (Gastrointestinal Panel by PCR, Stool)  Once,   R     12/04/18 1903   12/04/18 1903  Lactic acid, plasma  Once,   STAT     12/04/18 1902   Signed and Held  Prealbumin  Tomorrow morning,   R     Signed and Held   Signed and Held  Hemoglobin A1c  Add-on,   R    Comments:  To assess prior glycemic control    Signed and Held   Signed and Held  HIV antibody (Routine Testing)  Tomorrow morning,   R     Signed and Held   Signed and Held  Magnesium  Tomorrow morning,   R    Comments:  Call MD if <1.5    Signed and Held   Signed and Held  Phosphorus  Tomorrow morning,   R     Signed and Held   Signed and Held  TSH  Once,   R    Comments:  Cancel if already done within 1 month and notify MD    Signed and Held   Signed and Held  Comprehensive metabolic panel  Once,   R    Comments:  Cal MD for K<3.5 or >5.0    Signed and Held   Signed and Held  CBC  Once,   R    Comments:  Call for hg <8.0    Signed and Held   Signed and Held  Prealbumin  Tomorrow morning,   R     Signed and Held          Vitals/Pain Today's Vitals   12/04/18 1900 12/04/18 1930 12/04/18 2000 12/04/18 2030  BP: 135/65 131/66 103/72 119/64  Pulse: (!) 108 65 (!)  102 (!) 112  Resp: 18 13 16 17   Temp:      SpO2: 97% (!) 87% 100% 97%  Weight:      Height:      PainSc:        Isolation Precautions Enteric precautions (UV disinfection)  Medications Medications  0.9 %  sodium chloride infusion (500 mLs Intravenous New Bag/Given 12/04/18 1852)  metroNIDAZOLE (FLAGYL) IVPB 500 mg (500 mg Intravenous New Bag/Given (Non-Interop) 12/04/18 2019)  potassium chloride 10 mEq in 100 mL IVPB (has no administration in time range)  magnesium sulfate IVPB 2 g 50 mL (has no administration in time range)  sodium chloride flush (NS) 0.9 % injection  3 mL (3 mLs Intravenous Given 12/04/18 1241)  ondansetron (ZOFRAN-ODT) disintegrating tablet 4 mg (4 mg Oral Given 12/04/18 1141)  sodium chloride 0.9 % bolus 1,000 mL (0 mLs Intravenous Stopped 12/04/18 1522)  iohexol (OMNIPAQUE) 300 MG/ML solution 30 mL (30 mLs Oral Contrast Given 12/04/18 1458)  ciprofloxacin (CIPRO) IVPB 400 mg (0 mg Intravenous Stopped 12/04/18 2022)    Mobility non-ambulatory

## 2018-12-04 NOTE — ED Provider Notes (Signed)
[facility] DEPT Provider Note   CSN: [id number] Arrival date & time: 12/04/18  1110     History   Chief Complaint Chief Complaint  Patient presents with  . Emesis  . Diarrhea  . Abnormal Lab    HPI [name] is a 68 y.o. female.  Patient with history of lung cancer, kidney cancer status post partial nephrectomy --presents to the emergency department after having abnormal labs by primary care doctor.  Patient saw her doctor yesterday for ongoing episodes of watery, nonbloody diarrhea.  She admits to worsening right-sided abdominal pain.  Patient is also had severe nausea and occasional episodes of vomiting.  This has been worse in the past week or so.  Patient has been drinking water but admits to vomiting after drinking at times.  PCP had started her on antibiotics for presumed diverticulitis as she has had this in the past as well.  No history of antibiotic use prior to her symptoms starting.  Family states that she has been losing weight over the past 2 months.  No fevers, chest pain, shortness of breath.  No urinary symptoms.  Family unable to tell me what the abnormal labs were.  Onset of symptoms acute on chronic.  Course is worsening.     Past Medical History:  Diagnosis Date  . Anemia   . Anxiety   . Arthritis    "everywhere" (08/19/2013)  . Bilateral carpal tunnel syndrome   . Chest pain at rest, rule out pericarditis 11/27/2012  . CHF (congestive heart failure) ([place])   . Chronic kidney disease    dr Risa Grill  . Chronic lower back pain   . Complication of anesthesia    "woke up twice during knee replacement" (08/19/2013)  . Depression   . Dyspnea    w/ exertion    . Fibromyalgia   . Headache(784.0)    hx of migraines   . History of pericarditis, with pericardial window in 2009 11/27/2012  . Hx of ovarian cancer 11/27/2012  . Hypothyroidism 11/27/2012  . Iron deficiency anemia   . Kidney carcinoma ([place])    "both kidneys; ~ 3 yr apart"  (08/19/2013)  . Lung cancer ([place]) 2018  . Melanoma of back ([place])   . Migraines   . Pneumonia    "several times; including today", RUL/notes 08/19/2013  . Squamous carcinoma    "face, side of my nose, chest; used acid to get rid of them" (08/19/2013)  . Venous insufficiency 11/27/2012    Patient Active Problem List   Diagnosis Date Noted  . 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 Benzodiazepine withdrawal without complication ([place]) 78/93/8101  . Hypokalemia 03/13/2018  . Acute on chronic renal insufficiency 03/13/2018  . Generalized anxiety disorder 03/13/2018  . Acute cognitive decline 03/13/2018  . Adenocarcinoma of right lung, stage 1 ([place]) 10/22/2017  . Chronic pain syndrome 07/25/2017  . S/P left TKA 08/02/2015  . S/P knee replacement 08/02/2015  . Carpal tunnel syndrome 04/23/2015  . Autoimmune disease ([place]) 03/29/2013  . Apnea, sleep 03/29/2013  . Anxiety 11/28/2012  . Seizure disorder ([place]) 11/28/2012  . Anxiety state 11/28/2012  . History of pericarditis, with pericardial window in 2009, and recurrent episodes 2011,2013, treated with methotrexate 11/27/2012  . SOB (shortness of breath) 11/27/2012  . Hypothyroidism 11/27/2012  . History of partial nephrectomy, both rt. and lt 11/27/2012  . Hx of ovarian cancer 11/27/2012  . Venous insufficiency 11/27/2012  . Chronic back pain, hx of lumbar  spondylosis and stenosis L5-S1 with hx decompression and total diskectomy 11/27/2012  . Hx of melanoma of skin 11/27/2012  . 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 Chronic venous insufficiency 11/27/2012  . DIARRHEA-PRESUMED INFECTIOUS 12/27/2009    Past Surgical History:  Procedure Laterality Date  . ABDOMINAL HYSTERECTOMY  1979  . ANTERIOR LAT LUMBAR FUSION Left 07/16/2018   Procedure: Left Lumbar two-three  Anterolateral decompression/interbody fusion with lateral plate fixation;  Surgeon: Kristeen Miss, MD;  Location: [facility];  Service: Neurosurgery;   Laterality: Left;  . APPENDECTOMY    . DOPPLER ECHOCARDIOGRAPHY  02/23/2011   LVEF>50%  . FRACTURE SURGERY    . KNEE ARTHROSCOPY Right    "twice before the replacement" (08/19/2013)  . LEFT OOPHORECTOMY Left 1979  . [place]   "ruptured disc"  . MELANOMA EXCISION  ~ 2010   "my lower back" (08/19/2013)  . ORIF HIP FRACTURE Left    x 2 age 62 and 4  . PARTIAL NEPHRECTOMY Right 2005; ~ 2008  . PERICARDIAL WINDOW  ~ 2012  . POSTERIOR LUMBAR FUSION  2002?; 03/2012  . REDUCTION MAMMAPLASTY Bilateral ~ 2008  . RIGHT OOPHORECTOMY  ~ 2007   "it had cancer in it" (08/19/2013)  . SHOULDER ARTHROSCOPY W/ ROTATOR CUFF REPAIR Right 1990's  . TONSILLECTOMY  1954  . TOTAL KNEE ARTHROPLASTY Right 1990's  . TOTAL KNEE ARTHROPLASTY Left 08/02/2015   Procedure: LEFT TOTAL KNEE ARTHROPLASTY;  Surgeon: Paralee Cancel, MD;  Location: [facility];  Service: Orthopedics;  Laterality: Left;  . TOTAL SHOULDER REPLACEMENT Left 2006?  . TUBAL LIGATION  1952  . VIDEO ASSISTED THORACOSCOPY (VATS)/WEDGE RESECTION Right 09/28/2017   Procedure: RIGHT VIDEO ASSISTED THORACOSCOPY (VATS)/WEDGE RESECTION, LYMPH NODE DISSECTION ;  Surgeon: Melrose Nakayama, MD;  Location: [facility];  Service: Thoracic;  Laterality: Right;     OB History   No obstetric history on file.      Home Medications    Prior to Admission medications   Medication Sig Start Date End Date Taking? Authorizing Provider  buPROPion (WELLBUTRIN SR) 150 MG 12 hr tablet Take 1 tablet (150 mg total) by mouth 2 (two) times daily. Patient taking differently: Take 150 mg by mouth daily.  02/20/18   Forrest Moron, MD  cyclobenzaprine (FLEXERIL) 5 MG tablet Take 1 tablet (5 mg total) by mouth 3 (three) times daily as needed. For spasms 08/03/15   Danae Orleans, PA-C  diclofenac sodium (VOLTAREN) 1 % GEL Apply topically as directed. 10/25/16   [provider]  ibuprofen (ADVIL,MOTRIN) 200 MG tablet Take 400 mg by mouth every 6 (six)  hours as needed for headache or moderate pain.    [provider]  levothyroxine (SYNTHROID, LEVOTHROID) 100 MCG tablet TAKE 1 TABLET BY MOUTH BEFORE BREAKFAST MONDAY-FRIDAY SKIP SATURDAY ANS SUNDAY Patient taking differently: Take 100 mcg by mouth See admin instructions. Take 100 mcg by mouth daily before breakfast Monday thru Friday. Skip Saturday and Sunday. 04/26/18   Forrest Moron, MD  lisinopril (PRINIVIL,ZESTRIL) 10 MG tablet Take 1 tablet by mouth daily. 11/14/18   [provider]  methocarbamol (ROBAXIN) 500 MG tablet Take 1 tablet (500 mg total) by mouth every 6 (six) hours as needed for muscle spasms. 07/18/18   Kristeen Miss, MD  morphine (MS CONTIN) 30 MG 12 hr tablet Take 1 tablet (30 mg total) by mouth every 12 (twelve) hours. 07/18/18   Kristeen Miss, MD  polyethylene glycol (MIRALAX / Floria Raveling)  packet Take 17 g by mouth 2 (two) times daily. Patient taking differently: Take 17 g by mouth daily as needed for moderate constipation.  08/03/15   Danae Orleans, PA-C  PROAIR HFA 108 (90 BASE) MCG/ACT inhaler INHALE 2 PUFFS EVERY 4 HOURS AS NEEDED FOR WHEEZING, COUGH OR SHORTNESS OF BREATH    Daub, Loura Back, MD  traMADol (ULTRAM) 50 MG tablet Take 1-2 tablets (50-100 mg total) by mouth every 6 (six) hours as needed (mild pain). 10/02/17   Gold, Wayne E, PA-C  triamterene-hydrochlorothiazide (MAXZIDE) 75-50 MG tablet Take 1 tablet by mouth daily.    [provider]  vitamin B-12 (CYANOCOBALAMIN) 1000 MCG tablet Take 1 tablet by mouth daily.    [provider]  Vitamin D, Ergocalciferol, (DRISDOL) 1.25 MG (50000 UT) CAPS capsule Take 1 capsule by mouth as directed. 11/15/18 02/01/19  [provider]  zolpidem (AMBIEN) 5 MG tablet TAKE 1 TABLET BY MOUTH AT BEDTIME AS NEEDED FOR SLEEP. Patient taking differently: Take 5 mg by mouth at bedtime.  11/15/14   [name], MD    Family History Family History  Problem Relation Age of Onset  . Cancer  Mother 74       Stomach  . Cancer Father 59       Lung cancer  . Bipolar disorder Sister     Social History Social History   Tobacco Use  . Smoking status: Former Smoker    Packs/day: 1.50    Years: 18.00    Pack years: 27.00    Types: Cigarettes    Last attempt to quit: 09/22/1985    Years since quitting: 33.2  . Smokeless tobacco: Never Used  Substance Use Topics  . Alcohol use: No  . Drug use: No    Comment: prescribed     Allergies   Amoxicillin; Ampicillin; and Buspar [buspirone]   Review of Systems Review of Systems  Constitutional: Negative for fever.  HENT: Negative for rhinorrhea and sore throat.   Eyes: Negative for redness.  Respiratory: Negative for cough.   Cardiovascular: Negative for chest pain.  Gastrointestinal: Positive for abdominal pain, diarrhea, nausea and vomiting.  Genitourinary: Negative for dysuria.  Musculoskeletal: Negative for myalgias.  Skin: Negative for rash.  Neurological: Negative for headaches.     Physical Exam Updated Vital Signs BP 103/62   Pulse (!) 115   Temp 97.6 F (36.4 C)   Resp 17   Ht 5\' 6"  (1.676 m)   Wt 51.3 kg   SpO2 100%   BMI 18.24 kg/m   Physical Exam Vitals signs and nursing note reviewed.  Constitutional:      Appearance: She is well-developed.  HENT:     Head: Normocephalic and atraumatic.     Mouth/Throat:     Mouth: Mucous membranes are dry.     Comments: Tongue and lips are extremely dry. Eyes:     General:        Right eye: No discharge.        Left eye: No discharge.     Conjunctiva/sclera: Conjunctivae normal.  Neck:     Musculoskeletal: Normal range of motion and neck supple.  Cardiovascular:     Rate and Rhythm: Regular rhythm. Tachycardia present.     Heart sounds: Normal heart sounds.  Pulmonary:     Effort: Pulmonary effort is normal.     Breath sounds: Normal breath sounds.  Abdominal:     Palpations: Abdomen is soft.     Tenderness:  There is abdominal tenderness. There  is no right CVA tenderness, left CVA tenderness, guarding or rebound.  Skin:    General: Skin is warm and dry.  Neurological:     Mental Status: She is alert.      ED Treatments / Results  Labs (all labs ordered are listed, but only abnormal results are displayed) Labs Reviewed  COMPREHENSIVE METABOLIC PANEL - Abnormal; Notable for the following components:      Result Value   Sodium 134 (*)    Potassium 3.3 (*)    CO2 20 (*)    Glucose, Bld 182 (*)    BUN 109 (*)    Creatinine, Ser 2.29 (*)    Calcium 8.4 (*)    Albumin 3.0 (*)    GFR calc non Af Amer 21 (*)    GFR calc Af Amer 24 (*)    All other components within normal limits  CBC - Abnormal; Notable for the following components:   WBC 16.0 (*)    RBC 3.84 (*)    Hemoglobin 11.4 (*)    HCT 34.9 (*)    RDW 15.9 (*)    All other components within normal limits  MAGNESIUM - Abnormal; Notable for the following components:   Magnesium 1.3 (*)    All other components within normal limits  LIPASE, BLOOD  URINALYSIS, ROUTINE W REFLEX MICROSCOPIC  LACTIC ACID, PLASMA    EKG EKG Interpretation  Date/Time:  Wednesday December 04 2018 13:04:43 EST Ventricular Rate:  97 PR Interval:    QRS Duration: 93 QT Interval:  350 QTC Calculation: 445 R Axis:   48 Text Interpretation:  Sinus rhythm Nonspecific T abnormalities, diffuse leads No acute changes TWI in the inferior and lateral leads Confirmed by Varney Biles [phone]) on 12/04/2018 2:07:46 PM   Radiology No results found.  Procedures Procedures (including critical care time)  Medications Ordered in ED Medications  sodium chloride 0.9 % bolus 1,000 mL (1,000 mLs Intravenous New Bag/Given 12/04/18 1311)  sodium chloride flush (NS) 0.9 % injection 3 mL (3 mLs Intravenous Given 12/04/18 1241)  ondansetron (ZOFRAN-ODT) disintegrating tablet 4 mg (4 mg Oral Given 12/04/18 1141)     Initial Impression / Assessment and Plan / ED Course  I have reviewed the triage  vital signs and the nursing notes.  Pertinent labs & imaging results that were available during my care of the patient were reviewed by me and considered in my medical decision making (see chart for details).     Patient seen and examined. Work-up initiated. Medications ordered. Will need imaging, awaiting renal function.   Vital signs reviewed and are as follows: BP 103/62   Pulse (!) 115   Temp 97.6 F (36.4 C)   Resp 17   Ht 5\' 6"  (1.676 m)   Wt 51.3 kg   SpO2 100%   BMI 18.24 kg/m   4:22 PM patient discussed with and seen previously with Dr. Kathrynn Humble.  Patient will need admission for acute kidney injury.  Pending CAT scan to rule out intra-abdominal infection contributing to her current symptoms.  Patient and family updated on blood work findings and anticipated admission.  Handoff to [place] PA-C at shift change will follow up on results of CT imaging, treat as necessary, and admit.  Final Clinical Impressions(s) / ED Diagnoses   Final diagnoses:  Acute kidney injury [facility])   Pending completion of work-up.  ED Discharge Orders    None       Vanden Fawaz,  Vonna Kotyk, PA-C 12/06/18 2244    Varney Biles, MD 12/07/18 0830

## 2018-12-04 NOTE — ED Notes (Signed)
Patient transported to X-ray 

## 2018-12-05 DIAGNOSIS — G4730 Sleep apnea, unspecified: Secondary | ICD-10-CM | POA: Diagnosis not present

## 2018-12-05 DIAGNOSIS — R296 Repeated falls: Secondary | ICD-10-CM | POA: Diagnosis not present

## 2018-12-05 DIAGNOSIS — E876 Hypokalemia: Secondary | ICD-10-CM | POA: Diagnosis not present

## 2018-12-05 DIAGNOSIS — R197 Diarrhea, unspecified: Secondary | ICD-10-CM | POA: Diagnosis not present

## 2018-12-05 DIAGNOSIS — G894 Chronic pain syndrome: Secondary | ICD-10-CM | POA: Diagnosis not present

## 2018-12-05 DIAGNOSIS — L8990 Pressure ulcer of unspecified site, unspecified stage: Secondary | ICD-10-CM | POA: Diagnosis present

## 2018-12-05 DIAGNOSIS — N179 Acute kidney failure, unspecified: Secondary | ICD-10-CM | POA: Diagnosis not present

## 2018-12-05 DIAGNOSIS — E860 Dehydration: Secondary | ICD-10-CM | POA: Diagnosis not present

## 2018-12-05 DIAGNOSIS — I509 Heart failure, unspecified: Secondary | ICD-10-CM | POA: Diagnosis not present

## 2018-12-05 DIAGNOSIS — K529 Noninfective gastroenteritis and colitis, unspecified: Secondary | ICD-10-CM | POA: Diagnosis not present

## 2018-12-05 DIAGNOSIS — M797 Fibromyalgia: Secondary | ICD-10-CM | POA: Diagnosis not present

## 2018-12-05 DIAGNOSIS — N289 Disorder of kidney and ureter, unspecified: Secondary | ICD-10-CM | POA: Diagnosis not present

## 2018-12-05 DIAGNOSIS — E440 Moderate protein-calorie malnutrition: Secondary | ICD-10-CM | POA: Diagnosis not present

## 2018-12-05 DIAGNOSIS — N189 Chronic kidney disease, unspecified: Secondary | ICD-10-CM | POA: Diagnosis not present

## 2018-12-05 LAB — COMPREHENSIVE METABOLIC PANEL
ALT: 18 U/L (ref 0–44)
AST: 14 U/L — ABNORMAL LOW (ref 15–41)
Albumin: 2.4 g/dL — ABNORMAL LOW (ref 3.5–5.0)
Alkaline Phosphatase: 51 U/L (ref 38–126)
Anion gap: 11 (ref 5–15)
BUN: 78 mg/dL — ABNORMAL HIGH (ref 8–23)
CO2: 18 mmol/L — ABNORMAL LOW (ref 22–32)
Calcium: 7.6 mg/dL — ABNORMAL LOW (ref 8.9–10.3)
Chloride: 104 mmol/L (ref 98–111)
Creatinine, Ser: 1.48 mg/dL — ABNORMAL HIGH (ref 0.44–1.00)
GFR calc Af Amer: 41 mL/min — ABNORMAL LOW (ref >60)
GFR calc non Af Amer: 36 mL/min — ABNORMAL LOW (ref >60)
Glucose, Bld: 107 mg/dL — ABNORMAL HIGH (ref 70–99)
Potassium: 3.1 mmol/L — ABNORMAL LOW (ref 3.5–5.1)
Sodium: 133 mmol/L — ABNORMAL LOW (ref 135–145)
Total Bilirubin: 0.7 mg/dL (ref 0.3–1.2)
Total Protein: 5.2 g/dL — ABNORMAL LOW (ref 6.5–8.1)

## 2018-12-05 LAB — CBC
HCT: 31.3 % — ABNORMAL LOW (ref 36.0–46.0)
Hemoglobin: 9.7 g/dL — ABNORMAL LOW (ref 12.0–15.0)
MCH: 28.5 pg (ref 26.0–34.0)
MCHC: 31 g/dL (ref 30.0–36.0)
MCV: 92.1 fL (ref 80.0–100.0)
Platelets: 176 10*3/uL (ref 150–400)
RBC: 3.4 MIL/uL — ABNORMAL LOW (ref 3.87–5.11)
RDW: 15.9 % — ABNORMAL HIGH (ref 11.5–15.5)
WBC: 13.4 10*3/uL — ABNORMAL HIGH (ref 4.0–10.5)
nRBC: 0 % (ref 0.0–0.2)

## 2018-12-05 LAB — PREALBUMIN
Prealbumin: 10.3 mg/dL — ABNORMAL LOW (ref 18–38)
Prealbumin: 9.8 mg/dL — ABNORMAL LOW (ref 18–38)

## 2018-12-05 LAB — PHOSPHORUS: Phosphorus: 3.3 mg/dL (ref 2.5–4.6)

## 2018-12-05 LAB — GLUCOSE, CAPILLARY
Glucose-Capillary: 105 mg/dL — ABNORMAL HIGH (ref 70–99)
Glucose-Capillary: 103 mg/dL — ABNORMAL HIGH (ref 70–99)

## 2018-12-05 LAB — MAGNESIUM: Magnesium: 1.7 mg/dL (ref 1.7–2.4)

## 2018-12-05 LAB — TSH: TSH: 1.92 u[IU]/mL (ref 0.350–4.500)

## 2018-12-05 LAB — HIV ANTIBODY (ROUTINE TESTING W REFLEX): HIV Screen 4th Generation wRfx: NONREACTIVE

## 2018-12-05 MED ORDER — METRONIDAZOLE 500 MG PO TABS
500.0000 mg | ORAL_TABLET | Freq: Three times a day (TID) | ORAL | Status: DC
  Administered 2018-12-05 – 2018-12-06 (×3): 500 mg via ORAL
  Filled 2018-12-05 (×3): qty 1

## 2018-12-05 MED ORDER — ENSURE ENLIVE PO LIQD
237.0000 mL | Freq: Two times a day (BID) | ORAL | Status: DC
  Administered 2018-12-05: 237 mL via ORAL

## 2018-12-05 MED ORDER — ZOLPIDEM TARTRATE 5 MG PO TABS
5.0000 mg | ORAL_TABLET | Freq: Once | ORAL | Status: AC
  Administered 2018-12-05: 5 mg via ORAL
  Filled 2018-12-05: qty 1

## 2018-12-05 MED ORDER — SODIUM CHLORIDE 0.9 % IV SOLN
INTRAVENOUS | Status: AC
  Administered 2018-12-05: 16:00:00 via INTRAVENOUS

## 2018-12-05 MED ORDER — POTASSIUM CHLORIDE CRYS ER 20 MEQ PO TBCR
40.0000 meq | EXTENDED_RELEASE_TABLET | Freq: Two times a day (BID) | ORAL | Status: AC
  Administered 2018-12-05 (×2): 40 meq via ORAL
  Filled 2018-12-05 (×2): qty 2

## 2018-12-05 MED ORDER — CIPROFLOXACIN HCL 500 MG PO TABS
500.0000 mg | ORAL_TABLET | Freq: Two times a day (BID) | ORAL | Status: DC
  Administered 2018-12-05 – 2018-12-06 (×3): 500 mg via ORAL
  Filled 2018-12-05 (×3): qty 1

## 2018-12-05 NOTE — Evaluation (Signed)
Occupational Therapy Evaluation Patient Details Name: [name] MRN: [id number] DOB: Apr 03, 1951 Today's Date: 12/05/2018    History of Present Illness [name] is a 68 y.o. female with medical history significant of lung cancer, kidney cancer status post partial nephrectomy CHF, CKD, fibromyalgia, history of ovarian cancer, hypothyroidism. Admitted for GI secondary to dehydration in the setting of colitis.   Clinical Impression   PTA Pt mod I with history of falls. Pt with back sx in Sept. Pt is currently min guard for LB ADL, sink level grooming, transfers, and peri care. Pt REQUIRES a RW for safety and balance at this time. Pt with decreased activity tolerance, decreased balance, and will benefit from skilled OT in the acute setting to maximize safety and independence in ADL and functional transfers. Next session (if Patient does not discharge) to provide Energy conservation handout and ensure that Pt will use 3 in 1 for bathing.     Follow Up Recommendations  Supervision/Assistance - 24 hour(initially)    Equipment Recommendations  None recommended by OT(Pt has appropriate DME)    Recommendations for Other Services       Precautions / Restrictions Precautions Precautions: Fall Restrictions Weight Bearing Restrictions: No      Mobility Bed Mobility Overal bed mobility: Needs Assistance Bed Mobility: Supine to Sit;Sit to Supine     Supine to sit: Supervision Sit to supine: Supervision   General bed mobility comments: use of grab bars  Transfers Overall transfer level: Needs assistance Equipment used: None Transfers: Sit to/from Stand Sit to Stand: Min guard         General transfer comment: Pt requires RW for dynamic upright    Balance Overall balance assessment: History of Falls                                         ADL either performed or assessed with clinical judgement   ADL Overall ADL's : At baseline;Needs  assistance/impaired Eating/Feeding: Independent   Grooming: Min guard;Standing;Wash/dry hands Grooming Details (indicate cue type and reason): sink level, decreased activity tolerance for standing Upper Body Bathing: Set up;Sitting   Lower Body Bathing: Set up;Sitting/lateral leans   Upper Body Dressing : Set up   Lower Body Dressing: Set up;Sitting/lateral leans Lower Body Dressing Details (indicate cue type and reason): able to don/doff socks without assist EOB Toilet Transfer: Min guard;Ambulation;RW Toilet Transfer Details (indicate cue type and reason): RW is a MUST for safety Pt reporting "I do not feel balanced" when attempting to walk without it Toileting- Clothing Manipulation and Hygiene: Set up;Sitting/lateral lean       Functional mobility during ADLs: Min guard;Rolling walker General ADL Comments: verbally educated on energy conservation strategies for ADL     Vision Patient Visual Report: No change from baseline       Perception     Praxis      Pertinent Vitals/Pain Pain Assessment: No/denies pain     Hand Dominance     Extremity/Trunk Assessment Upper Extremity Assessment Upper Extremity Assessment: Overall WFL for tasks assessed   Lower Extremity Assessment Lower Extremity Assessment: Generalized weakness   Cervical / Trunk Assessment Cervical / Trunk Assessment: Normal(back sx in Sept 2019)   Communication Communication Communication: No difficulties   Cognition Arousal/Alertness: Awake/alert Behavior During Therapy: WFL for tasks assessed/performed Overall Cognitive Status: Within Functional Limits for tasks assessed  General Comments  granddaughter present throughout session    Exercises     Shoulder [place] expects to be discharged to:: Private residence Living Arrangements: Spouse/significant other(grandaughter reports that spouse is not very  helpful) Available Help at Discharge: Family;Available 24 hours/day Type of Home: House Home Access: Stairs to enter [employer] of Steps: 3 Entrance Stairs-Rails: Right;Left Home Layout: One level     Bathroom Shower/Tub: Occupational psychologist: Standard     Home Equipment: Cane - single point;Walker - 2 wheels          Prior Functioning/Environment Level of Independence: Independent        Comments: does not get out much, talked about setting up activities to get her motivated to get out of the house        OT Problem List: Decreased strength;Decreased activity tolerance;Impaired balance (sitting and/or standing);Decreased safety awareness;Decreased knowledge of use of DME or AE      OT Treatment/Interventions: Self-care/ADL training;Therapeutic exercise;Energy conservation;DME and/or AE instruction;Therapeutic activities;Patient/family education;Balance training    OT Goals(Current goals can be found in the care plan section) Acute Rehab OT Goals Patient Stated Goal: get back to walking her dog daily OT Goal Formulation: With patient Time For Goal Achievement: 12/19/18 Potential to Achieve Goals: Good ADL Goals Pt Will Transfer to Toilet: with modified independence;ambulating Pt Will Perform Toileting - Clothing Manipulation and hygiene: with modified independence;sit to/from stand Additional ADL Goal #1: Pt will perform bed mobility at independent level prior to engaging ADL Additional ADL Goal #2: Pt will recall 3 ways of conserving energy during ADL at independent level  OT Frequency: Min 2X/week   Barriers to D/C:            Co-evaluation              AM-PAC OT "6 Clicks" Daily Activity     Outcome Measure Help from another person eating meals?: None Help from another person taking care of personal grooming?: A Little Help from another person toileting, which includes using toliet, bedpan, or urinal?: A Little Help from  another person bathing (including washing, rinsing, drying)?: A Little Help from another person to put on and taking off regular upper body clothing?: A Little Help from another person to put on and taking off regular lower body clothing?: A Little 6 Click Score: 19   End of Session Equipment Utilized During Treatment: Gait belt;Rolling walker Nurse Communication: Mobility status  Activity Tolerance: Patient tolerated treatment well;Patient limited by fatigue Patient left: in bed;with call bell/phone within reach;with family/visitor present  OT Visit Diagnosis: Unsteadiness on feet (R26.81);Other abnormalities of gait and mobility (R26.89);History of falling (Z91.81);Muscle weakness (generalized) (M62.81)                Time: 1345-1405 OT Time Calculation (min): 20 min Charges:  OT General Charges $OT Visit: 1 Visit OT Evaluation $OT Eval Low Complexity: [employer] OTR/L Acute Rehabilitation Services Pager: [phone] Office: [place] 12/05/2018, 2:18 PM

## 2018-12-05 NOTE — Progress Notes (Addendum)
Initial Nutrition Assessment  DOCUMENTATION CODES:   Non-severe (moderate) malnutrition in context of chronic illness  INTERVENTION:  Ensure Enlive po BID, each supplement provides 350 kcal and 20 grams of protein Educated patient about smaller meals more frequently and including protein with each meal to assist in wound healing.   NUTRITION DIAGNOSIS:   Moderate Malnutrition related to chronic illness(multiple cancers) as evidenced by moderate fat depletion, moderate muscle depletion, mild muscle depletion, and energy intake < 75% for >/= 1 month.  GOAL:   Patient will meet greater than or equal to 90% of their needs   MONITOR:   Diet advancement, Supplement acceptance, PO intake, Weight trends  REASON FOR ASSESSMENT:   Malnutrition Screening Tool, Consult Assessment of nutrition requirement/status  ASSESSMENT:   68 year old female w/ PMH of CHF, anemia, depression, ovarian cancer, lung cancer, kidney cancer s/p partial nephrectomy, hypothyroidism, diverticulitis and CKD. Pt presented to ED with N/V and diarrhea for the last 3-4 days.    Spoke with pt who was in her bedside chair with her granddaughter in the room. Pt had just finished her tray full of clear liquids.   Pt reported that over the last few days she has only been taking bites and sips because of the n/v and diarrhea. She mentioned that she really only likes soups, cereal and frozen meals. She does not have the energy or appetite to fix full meals and instead just nibbles on food throughout the day. Her granddaughter said "she eats like a bird". She occasionally snacks on fruits.   Pt mentioned that she knows she needs to eat and drink more but she just does not feel the urge to do so. Pt has noticed that she has lost weight, reported a UBW of 150#. Current documented wt (02/12) is 113#, suspect this is incorrect as her last office visit on 02/11 she weighed 137#. Her wt documented on 02/03 was 143# which would  indicate a 4% wt loss which is significant for the time frame. NFPE revealed mild to moderate muscle and fat depletion; patient meets criteria for moderate malnutrition.   Pt also has a stage 2 wound on her sacrum. Inquired about this and she reported that she has had it for awhile. Educated that she needs to eat larger meals or multiple small ones throughout the day with protein sources at each one to help heal that wound. Patient was unaware that protein helps with wound healing and is willing to begin drinking Ensures between meals. Recommend EE BID.  Medications reviewed and include: insulin aspart 0-9 units q 4 hr, KCl tablet 35mEq 2x daily, IV Cipro 400mg  q 24hr, IV Flagyl 500mg  q 8hr Labs reviewed: Na 133 (L), K 3.1 (L), Phos 3.3 (WNL), Mg 1.7 (WNL)    NUTRITION - FOCUSED PHYSICAL EXAM:    Most Recent Value  Orbital Region  Moderate depletion  Upper Arm Region  Moderate depletion  Thoracic and Lumbar Region  No depletion  Buccal Region  No depletion  Temple Region  Moderate depletion  Clavicle Bone Region  Moderate depletion  Clavicle and Acromion Bone Region  Mild depletion  Scapular Bone Region  Moderate depletion  Dorsal Hand  Moderate depletion  Patellar Region  No depletion  Anterior Thigh Region  No depletion  Posterior Calf Region  Mild depletion  Edema (RD Assessment)  None  Hair  Reviewed  Eyes  Reviewed  Mouth  Reviewed  Skin  Reviewed  Nails  Reviewed  Diet Order:   Diet Order            DIET SOFT Room service appropriate? Yes; Fluid consistency: Thin  Diet effective now              EDUCATION NEEDS:   Education needs have been addressed  Skin:  Skin Assessment: Skin Integrity Issues: Skin Integrity Issues:: Stage II Stage II: Mid sacrum Measurement: 2X.1X.1cm  Last BM:  02/11  Height:   Ht Readings from Last 1 Encounters:  12/04/18 5\' 6"  (1.676 m)    Weight:   Wt Readings from Last 1 Encounters:  12/04/18 51.3 kg    Ideal Body  Weight:  59.09 kg  BMI:  Body mass index is 18.24 kg/m.  Estimated Nutritional Needs:   Kcal:  1500-1750 kcal  Protein:  75-85 g  Fluid:  >/= 1.5L    [employer] Dietetic Intern

## 2018-12-05 NOTE — Evaluation (Signed)
Physical Therapy Evaluation Patient Details Name: [name] MRN: [id number] DOB: [birth year] Today's Date: 12/05/2018   History of Present Illness  [name] is a 68 y.o. female with medical history significant of lung cancer, kidney cancer status post partial nephrectomy CHF, CKD, fibromyalgia, history of ovarian cancer, hypothyroidism. Admitted for GI secondary to dehydration in the setting of colitis.  Clinical Impression  Pt admitted with above diagnosis. Pt currently with functional limitations due to the deficits listed below (see PT Problem List).  Pt will benefit from skilled PT to increase their independence and safety with mobility to allow discharge to the venue listed below.  Pt assisted to bathroom and then ambulated in hallway.  Pt initially unsteady and requiring min assist.  Recommend pt utilize RW at this time for safety.  Pt reports she lives with her husband and family assists as needed.  Pt also has RW at home.  Pt would benefit from HHPT upon d/c.     Follow Up Recommendations Home health PT;Supervision for mobility/OOB    Equipment Recommendations  None recommended by PT    Recommendations for Other Services       Precautions / Restrictions Precautions Precautions: Fall Restrictions Weight Bearing Restrictions: No      Mobility  Bed Mobility Overal bed mobility: Needs Assistance Bed Mobility: Supine to Sit     Supine to sit: Min assist     General bed mobility comments: assist for trunk upright  Transfers Overall transfer level: Needs assistance Equipment used: None Transfers: Sit to/from Stand Sit to Stand: Min assist         General transfer comment: initially no assistive device as pt eager to get into bathroom however unsteady and required min assist, improved with cues for hand placement and use of RW  Ambulation/Gait Ambulation/Gait assistance: Min assist Gait Distance (Feet): 160 Feet Assistive device: Rolling walker (2  wheeled) Gait Pattern/deviations: Step-through pattern;Decreased stride length     General Gait Details: verbal cues for safe use of RW, initial assist for steadying however improved with distance  Stairs            Wheelchair Mobility    Modified Rankin (Stroke Patients Only)       Balance Overall balance assessment: History of Falls(reports falls at home due to illnesses)                                           Pertinent Vitals/Pain Pain Assessment: No/denies pain    Home Living Family/patient expects to be discharged to:: Private residence Living Arrangements: Spouse/significant other Available Help at Discharge: Family;Available 24 hours/day Type of Home: House Home Access: Stairs to enter Entrance Stairs-Rails: Psychiatric nurse of Steps: 3 Home Layout: One level Home Equipment: Cane - single point;Walker - 2 wheels      Prior Function Level of Independence: Independent               Hand Dominance        Extremity/Trunk Assessment        Lower Extremity Assessment Lower Extremity Assessment: Generalized weakness    Cervical / Trunk Assessment Cervical / Trunk Assessment: Normal  Communication   Communication: No difficulties  Cognition Arousal/Alertness: Awake/alert Behavior During Therapy: WFL for tasks assessed/performed Overall Cognitive Status: Within Functional Limits for tasks assessed  General Comments      Exercises     Assessment/Plan    PT Assessment Patient needs continued PT services  PT Problem List Decreased strength;Decreased mobility;Decreased activity tolerance;Decreased balance;Decreased knowledge of use of DME;Decreased safety awareness       PT Treatment Interventions DME instruction;Functional mobility training;Balance training;Patient/family education;Gait training;Therapeutic activities;Stair training;Therapeutic  exercise    PT Goals (Current goals can be found in the Care Plan section)  Acute Rehab PT Goals PT Goal Formulation: With patient Time For Goal Achievement: 12/19/18 Potential to Achieve Goals: Good    Frequency Min 3X/week   Barriers to discharge        Co-evaluation               AM-PAC PT "6 Clicks" Mobility  Outcome Measure Help needed turning from your back to your side while in a flat bed without using bedrails?: A Little Help needed moving from lying on your back to sitting on the side of a flat bed without using bedrails?: A Little Help needed moving to and from a bed to a chair (including a wheelchair)?: A Little Help needed standing up from a chair using your arms (e.g., wheelchair or bedside chair)?: A Little Help needed to walk in hospital room?: A Little Help needed climbing 3-5 steps with a railing? : A Little 6 Click Score: 18    End of Session Equipment Utilized During Treatment: Gait belt Activity Tolerance: Patient tolerated treatment well Patient left: in chair;with call bell/phone within reach;with chair alarm set;with family/visitor present Nurse Communication: Mobility status PT Visit Diagnosis: Other abnormalities of gait and mobility (R26.89)    Time: 3200-3794 PT Time Calculation (min) (ACUTE ONLY): 18 min   Charges:   PT Evaluation $PT Eval Low Complexity: [place], PT, DPT Acute Rehabilitation Services Office: [phone] Pager: [phone]   Trena Platt 12/05/2018, 12:09 PM

## 2018-12-05 NOTE — Care Management Obs Status (Signed)
[facility] NOTIFICATION   Patient Details  Name: [name] MRN: [id number] Date of Birth: [birth year]   Medicare Observation Status Notification Given:  Yes    Purcell Mouton, RN 12/05/2018, 3:37 PM

## 2018-12-05 NOTE — Consult Note (Addendum)
[facility] Nurse wound consult note Reason for Consult: Consult requested for sacrum Wound type: Stage 2 pressure injury Pressure Injury POA: Yes Measurement: 2X.1X.1cm Wound bed: pink and dry Drainage (amount, consistency, odor) no odor or drainage Periwound: intact Dressing procedure/placement/frequency: Foam dressing to protect and promote healing.  Discussed plan of care with patient. Please re-consult if further assistance is needed.  Thank-you,  Julien Girt MSN, [place], [place], [place], [place]

## 2018-12-05 NOTE — Care Management Note (Signed)
Case Management Note  Patient Details  Name: [name] MRN: [id number] Date of Birth: [birth year]  Subjective/Objective:                    Action/Plan:Pt declined HH at present time.    Expected Discharge Date:  12/06/18               Expected Discharge Plan:  Home/Self Care  In-House Referral:     Discharge planning Services  CM Consult  Post Acute Care Choice:    Choice offered to:  Patient  DME Arranged:    DME Agency:     HH Arranged:    [facility] Agency:     Status of Service:  Completed, signed off  If discussed at [employer] of Stay Meetings, dates discussed:    Additional CommentsPurcell Mouton, RN 12/05/2018, 3:43 PM

## 2018-12-05 NOTE — Progress Notes (Signed)
Assumed care of pt from previous nurse. Agree with previous nurses assessment. Will continue to monitor.

## 2018-12-05 NOTE — Progress Notes (Signed)
PROGRESS NOTE    [name]  FXT:[id number] DOB: 09-22-1951 DOA: 12/04/2018 PCP: Bernerd Limbo, MD    Brief Narrative:  68 y.o. female with medical history significant of lung cancer, kidney cancer status post partial nephrectomy CHF, CKD, fibromyalgia, history of ovarian cancer, hypothyroidism    Presented with   nausea vomiting diarrhea for the past 3 to 4 days she was seen by her primary care provider yesterday and was told to go to emergency department secondary to dehydration and abnormal labs Ports diarrhea is watery nonbloody.  She has been having worsening right-sided abdominal pain.  She is been getting worse over the past 1 week.  Has been lethargic she has hard time tolerating p.o. even water.  Her primary care provider initially provided her with antibiotics thinking it could be diverticulitis because she had history of this in the past.  But she have not had any antibiotics before starting her symptoms. No associated fevers or chills shortness of breath or chest pain but she has been losing some weight for the past few months. Family states she had syncopal episode on the commode yesterday. Reports decreased PO intake No sick contacts, no eating out  Assessment & Plan:   Principal Problem:   Colitis Active Problems:   DIARRHEA-PRESUMED INFECTIOUS   Hypothyroidism   Seizure disorder (HCC)   Apnea, sleep   Chronic pain syndrome   Hypokalemia   Acute on chronic renal insufficiency   Hypomagnesemia   Dehydration   Weight loss   Pressure injury of skin  Present on Admission: . Colitis -findings of colitis noted on CT abd per my own review -clinically improved overnight, tolerating diet, although pt still reports abd pain -Improved with IV cipro and flagyl. Will transition to PO abx -continue IVF hydration per below  . Hypothyroidism  -TSH reviewed, within normal limits  . DIARRHEA-PRESUMED INFECTIOUS secondary to colitis.   -No prior use of antibiotics will  obtain gastric panel hold off on C. difficile testing at this time -Resolved  . Apnea, sleep does not use CPAP  . Chronic pain syndrome - -continue home medications if able to tolerate -appears stable at present  . Hypokalemia -Potassium remains low at 3.1. Will replace -Repeat bmet in AM  . Hypomagnesemia replaced overnight  . Dehydration -Remains clinically dehydrated with dry mucus membranes, low serum bicarb and elevated BUN -Continue IVF hydration as tolerated  . Weight loss patient multiple history of cancers in the past with decreased p.o. intake -Continue to encourage PO intake as tolerated  Recent fall resulting in facial trauma CT head unremarkable no evidence of intracranial bleeding.   -PT consulted. Recommendation for home health PT  Pressure ulcer -wound care consult ordered pressure ulcer none consistent with patient's history of only being weak for past few days it is possible the patient has been poorly ambulatory for quite some time  ARF -Presenting Cr of 2.29 with baseline Cr of around 1.45 -Cr improved to 1.48 overnight -Repeat bmet in AM   DVT prophylaxis: SCD's Code Status: Full Family Communication: Pt in room, family at bedside Disposition Plan: Possible d/c home in 24hrs  Consultants:     Procedures:     Antimicrobials: Anti-infectives (From admission, onward)   Start     Dose/Rate Route Frequency Ordered Stop   12/05/18 1800  ciprofloxacin (CIPRO) IVPB 400 mg  Status:  Discontinued     400 mg 200 mL/hr over 60 Minutes Intravenous Every 24 hours 12/04/18 2211 12/05/18 1110  12/05/18 1400  metroNIDAZOLE (FLAGYL) tablet 500 mg     500 mg Oral Every 8 hours 12/05/18 1110     12/05/18 1200  ciprofloxacin (CIPRO) tablet 500 mg     500 mg Oral 2 times daily 12/05/18 1110     12/05/18 0600  metroNIDAZOLE (FLAGYL) IVPB 500 mg  Status:  Discontinued     500 mg 100 mL/hr over 60 Minutes Intravenous Every 8 hours 12/04/18 2211 12/05/18 1110    12/04/18 1900  metroNIDAZOLE (FLAGYL) IVPB 500 mg     500 mg 100 mL/hr over 60 Minutes Intravenous  Once 12/04/18 1859 12/04/18 2119   12/04/18 1830  ciprofloxacin (CIPRO) IVPB 400 mg     400 mg 200 mL/hr over 60 Minutes Intravenous  Once 12/04/18 1827 12/04/18 2022       Subjective: Feeling better  Objective: Vitals:   12/04/18 2030 12/04/18 2203 12/05/18 0504 12/05/18 1425  BP: 119/64 118/60 (!) 111/56 121/68  Pulse: (!) 112 100 85 (!) 101  Resp: 17 16 18    Temp:  98.4 F (36.9 C) 97.7 F (36.5 C) 98.4 F (36.9 C)  TempSrc:  Oral Oral Oral  SpO2: 97% 99% 99% 100%  Weight:      Height:        Intake/Output Summary (Last 24 hours) at 12/05/2018 1851 Last data filed at 12/05/2018 1616 Gross per 24 hour  Intake 1436.64 ml  Output 350 ml  Net 1086.64 ml   Filed Weights   12/04/18 1136  Weight: 51.3 kg    Examination:  General exam: Appears calm and comfortable  Respiratory system: Clear to auscultation. Respiratory effort normal. Cardiovascular system: S1 & S2 heard, RRR, s1, s2 Gastrointestinal system: Abdomen is nondistended, soft and nontender. No organomegaly or masses felt.  Central nervous system: Alert and oriented. No focal neurological deficits. Extremities: Symmetric 5 x 5 power. Skin: No rashes, lesions  Psychiatry: Judgement and insight appear normal. Mood & affect appropriate.   Data Reviewed: I have personally reviewed following labs and imaging studies  CBC: Recent Labs  Lab 12/04/18 1304 12/05/18 0417  WBC 16.0* 13.4*  HGB 11.4* 9.7*  HCT 34.9* 31.3*  MCV 90.9 92.1  PLT 197 970   Basic Metabolic Panel: Recent Labs  Lab 12/04/18 1304 12/04/18 1309 12/05/18 0417  NA 134*  --  133*  K 3.3*  --  3.1*  CL 101  --  104  CO2 20*  --  18*  GLUCOSE 182*  --  107*  BUN 109*  --  78*  CREATININE 2.29*  --  1.48*  CALCIUM 8.4*  --  7.6*  MG  --  1.3* 1.7  PHOS  --  4.2 3.3   GFR: Estimated Creatinine Clearance: 29.1 mL/min (A) (by C-G  formula based on SCr of 1.48 mg/dL (H)). Liver Function Tests: Recent Labs  Lab 12/04/18 1304 12/05/18 0417  AST 18 14*  ALT 19 18  ALKPHOS 62 51  BILITOT 0.8 0.7  PROT 6.6 5.2*  ALBUMIN 3.0* 2.4*   Recent Labs  Lab 12/04/18 1304  LIPASE 26   No results for input(s): AMMONIA in the last 168 hours. Coagulation Profile: No results for input(s): INR, PROTIME in the last 168 hours. Cardiac Enzymes: No results for input(s): CKTOTAL, CKMB, CKMBINDEX, TROPONINI in the last 168 hours. BNP (last 3 results) No results for input(s): PROBNP in the last 8760 hours. HbA1C: No results for input(s): HGBA1C in the last 72 hours. CBG: Recent Labs  Lab 12/04/18 2341 12/05/18 0501 12/05/18 0829  GLUCAP 130* 105* 103*   Lipid Profile: No results for input(s): CHOL, HDL, LDLCALC, TRIG, CHOLHDL, LDLDIRECT in the last 72 hours. Thyroid Function Tests: Recent Labs    12/05/18 0417  TSH 1.920   Anemia Panel: No results for input(s): VITAMINB12, FOLATE, FERRITIN, TIBC, IRON, RETICCTPCT in the last 72 hours. Sepsis Labs: Recent Labs  Lab 12/04/18 1308 12/04/18 2312  LATICACIDVEN 0.9 1.1    No results found for this or any previous visit (from the past 240 hour(s)).   Radiology Studies: Ct Abdomen Pelvis Wo Contrast  Result Date: 12/04/2018 CLINICAL DATA:  Initial evaluation for acute generalized abdominal pain, vomiting, diarrhea. EXAM: CT ABDOMEN AND PELVIS WITHOUT CONTRAST TECHNIQUE: Multidetector CT imaging of the abdomen and pelvis was performed following the standard protocol without IV contrast. COMPARISON:  Prior CT from 08/24/2017 FINDINGS: Lower chest: Visualized lung bases are clear. Hepatobiliary: Limited noncontrast evaluation of the liver is unremarkable. Gallbladder contracted without acute finding. Common bile duct dilated up to 10 mm. No visible obstructive stones or other abnormality. This is similar to previous. No appreciable intrahepatic biliary dilatation on this  noncontrast examination. Pancreas: Diffuse fatty infiltration the pancreas noted. Pancreas otherwise unremarkable. Spleen: Spleen within normal limits. Adrenals/Urinary Tract: Adrenal glands are normal. Kidneys fairly equal in size. No nephrolithiasis or hydronephrosis. Approximate 14 mm intermediate density cystic lesion at the lower pole left kidney is grossly similar to previous. Fat containing lesion within the interpolar right kidney grossly stable as well. No appreciable hydroureter bladder moderately distended without acute finding. Stomach/Bowel: Stomach within normal limits. No evidence for bowel obstruction. Appendix within normal limits. Mild diffuse gaseous distension of the transverse colon. Associated wall thickening about the transverse and proximal descending colon, suggesting possible acute colitis. No other significant inflammatory changes about the bowels. Vascular/Lymphatic: Moderate aorto bi-iliac atherosclerotic disease. No aneurysm. Shotty subcentimeter retroperitoneal lymph nodes noted. No pathologically enlarged intra-abdominal or pelvic lymph nodes identified. Reproductive: Uterus is absent.  Ovaries not confidently identified. Other: No free air or fluid. Musculoskeletal: No acute osseous abnormality. No discrete lytic or blastic osseous lesions. Extensive postoperative changes noted within the lumbar spine. Patient status post vertebral augmentation at L1. IMPRESSION: 1. Abnormal wall thickening about the mildly dilated transverse and proximal descending colon, suggesting acute colitis. 2. Mild extrahepatic biliary dilatation, similar relative to previous exam. No obstructive stone or other finding identified. Correlation with liver function tests suggested. 3. Moderate aorto bi-iliac atherosclerotic disease. Electronically Signed   By: Jeannine Boga M.D.   On: 12/04/2018 18:03   Dg Chest 2 View  Result Date: 12/04/2018 CLINICAL DATA:  Vomiting and diarrhea for 3-4 days,  dehydration, former smoker, CHF, history of bearing cancer EXAM: CHEST - 2 VIEW COMPARISON:  08/28/2018 FINDINGS: Normal heart size, mediastinal contours, and pulmonary vascularity. Postsurgical changes RIGHT upper lobe. Atherosclerotic calcification aorta. Lungs appear hyperinflated but otherwise clear. No infiltrate, pleural effusion, or pneumothorax. Bones demineralized with note of a LEFT shoulder prosthesis and chronic RIGHT rotator cuff tear. IMPRESSION: Hyperinflated lungs with postsurgical changes in RIGHT upper lobe. No acute abnormalities. Electronically Signed   By: Lavonia Dana M.D.   On: 12/04/2018 19:45   Ct Head Wo Contrast  Result Date: 12/04/2018 CLINICAL DATA:  Lethargic. Altered mental status. EXAM: CT HEAD WITHOUT CONTRAST TECHNIQUE: Contiguous axial images were obtained from the base of the skull through the vertex without intravenous contrast. COMPARISON:  CT head 10/06/2013. FINDINGS: Brain: No evidence for acute infarction, hemorrhage,  mass lesion, hydrocephalus, or extra-axial fluid. Normal for age cerebral volume. Mild hypoattenuation of white matter, likely mild small vessel disease. Vascular: Calcification of the cavernous internal carotid arteries consistent with cerebrovascular atherosclerotic disease. No signs of intracranial large vessel occlusion. Skull: Calvarium intact Sinuses/Orbits: Negative orbits. No acute finding. Other: None. Compared with priors, similar appearance. IMPRESSION: Mild atrophy and small vessel disease. No acute intracranial findings. Electronically Signed   By: Staci Righter M.D.   On: 12/04/2018 20:53    Scheduled Meds: . buPROPion  150 mg Oral Daily  . ciprofloxacin  500 mg Oral BID  . feeding supplement (ENSURE ENLIVE)  237 mL Oral BID BM  . levothyroxine  100 mcg Oral Q0600  . metroNIDAZOLE  500 mg Oral Q8H  . morphine  30 mg Oral Q12H  . potassium chloride  40 mEq Oral BID   Continuous Infusions: . sodium chloride 1,000 mL (12/05/18 1049)    . sodium chloride 100 mL/hr at 12/05/18 1616     LOS: 0 days   Marylu Lund, MD [facility] Pager On Amion  If 7PM-7AM, please contact night-coverage 12/05/2018, 6:51 PM

## 2018-12-06 DIAGNOSIS — E860 Dehydration: Secondary | ICD-10-CM | POA: Diagnosis present

## 2018-12-06 DIAGNOSIS — N189 Chronic kidney disease, unspecified: Secondary | ICD-10-CM | POA: Diagnosis present

## 2018-12-06 DIAGNOSIS — G4730 Sleep apnea, unspecified: Secondary | ICD-10-CM | POA: Diagnosis present

## 2018-12-06 DIAGNOSIS — R296 Repeated falls: Secondary | ICD-10-CM | POA: Diagnosis present

## 2018-12-06 DIAGNOSIS — I509 Heart failure, unspecified: Secondary | ICD-10-CM | POA: Diagnosis present

## 2018-12-06 DIAGNOSIS — G894 Chronic pain syndrome: Secondary | ICD-10-CM | POA: Diagnosis present

## 2018-12-06 DIAGNOSIS — E440 Moderate protein-calorie malnutrition: Secondary | ICD-10-CM | POA: Diagnosis present

## 2018-12-06 DIAGNOSIS — K529 Noninfective gastroenteritis and colitis, unspecified: Secondary | ICD-10-CM | POA: Diagnosis present

## 2018-12-06 DIAGNOSIS — Z85118 Personal history of other malignant neoplasm of bronchus and lung: Secondary | ICD-10-CM | POA: Diagnosis not present

## 2018-12-06 DIAGNOSIS — L89152 Pressure ulcer of sacral region, stage 2: Secondary | ICD-10-CM | POA: Diagnosis present

## 2018-12-06 DIAGNOSIS — Z981 Arthrodesis status: Secondary | ICD-10-CM | POA: Diagnosis not present

## 2018-12-06 DIAGNOSIS — N179 Acute kidney failure, unspecified: Secondary | ICD-10-CM | POA: Diagnosis present

## 2018-12-06 DIAGNOSIS — Z90710 Acquired absence of both cervix and uterus: Secondary | ICD-10-CM | POA: Diagnosis not present

## 2018-12-06 DIAGNOSIS — Z905 Acquired absence of kidney: Secondary | ICD-10-CM | POA: Diagnosis not present

## 2018-12-06 DIAGNOSIS — E876 Hypokalemia: Secondary | ICD-10-CM | POA: Diagnosis present

## 2018-12-06 DIAGNOSIS — N289 Disorder of kidney and ureter, unspecified: Secondary | ICD-10-CM | POA: Diagnosis not present

## 2018-12-06 DIAGNOSIS — Z96653 Presence of artificial knee joint, bilateral: Secondary | ICD-10-CM | POA: Diagnosis present

## 2018-12-06 DIAGNOSIS — E039 Hypothyroidism, unspecified: Secondary | ICD-10-CM | POA: Diagnosis present

## 2018-12-06 DIAGNOSIS — R5381 Other malaise: Secondary | ICD-10-CM | POA: Diagnosis present

## 2018-12-06 DIAGNOSIS — R112 Nausea with vomiting, unspecified: Secondary | ICD-10-CM | POA: Diagnosis not present

## 2018-12-06 DIAGNOSIS — Z8543 Personal history of malignant neoplasm of ovary: Secondary | ICD-10-CM | POA: Diagnosis not present

## 2018-12-06 DIAGNOSIS — M797 Fibromyalgia: Secondary | ICD-10-CM | POA: Diagnosis present

## 2018-12-06 DIAGNOSIS — Z85528 Personal history of other malignant neoplasm of kidney: Secondary | ICD-10-CM | POA: Diagnosis not present

## 2018-12-06 DIAGNOSIS — G40909 Epilepsy, unspecified, not intractable, without status epilepticus: Secondary | ICD-10-CM | POA: Diagnosis present

## 2018-12-06 DIAGNOSIS — Z85820 Personal history of malignant melanoma of skin: Secondary | ICD-10-CM | POA: Diagnosis not present

## 2018-12-06 LAB — COMPREHENSIVE METABOLIC PANEL
ALT: 16 U/L (ref 0–44)
AST: 16 U/L (ref 15–41)
Albumin: 2.4 g/dL — ABNORMAL LOW (ref 3.5–5.0)
Alkaline Phosphatase: 55 U/L (ref 38–126)
Anion gap: 9 (ref 5–15)
BUN: 40 mg/dL — ABNORMAL HIGH (ref 8–23)
CO2: 19 mmol/L — ABNORMAL LOW (ref 22–32)
Calcium: 8 mg/dL — ABNORMAL LOW (ref 8.9–10.3)
Chloride: 108 mmol/L (ref 98–111)
Creatinine, Ser: 1.12 mg/dL — ABNORMAL HIGH (ref 0.44–1.00)
GFR calc Af Amer: 58 mL/min — ABNORMAL LOW (ref >60)
GFR calc non Af Amer: 50 mL/min — ABNORMAL LOW (ref >60)
Glucose, Bld: 111 mg/dL — ABNORMAL HIGH (ref 70–99)
Potassium: 4.1 mmol/L (ref 3.5–5.1)
Sodium: 136 mmol/L (ref 135–145)
Total Bilirubin: 0.7 mg/dL (ref 0.3–1.2)
Total Protein: 5.2 g/dL — ABNORMAL LOW (ref 6.5–8.1)

## 2018-12-06 LAB — CBC
HCT: 30 % — ABNORMAL LOW (ref 36.0–46.0)
Hemoglobin: 9.6 g/dL — ABNORMAL LOW (ref 12.0–15.0)
MCH: 29.4 pg (ref 26.0–34.0)
MCHC: 32 g/dL (ref 30.0–36.0)
MCV: 91.7 fL (ref 80.0–100.0)
Platelets: 179 10*3/uL (ref 150–400)
RBC: 3.27 MIL/uL — ABNORMAL LOW (ref 3.87–5.11)
RDW: 16.1 % — ABNORMAL HIGH (ref 11.5–15.5)
WBC: 11.7 10*3/uL — ABNORMAL HIGH (ref 4.0–10.5)
nRBC: 0 % (ref 0.0–0.2)

## 2018-12-06 LAB — HEMOGLOBIN A1C
Hgb A1c MFr Bld: 5.5 % (ref 4.8–5.6)
Mean Plasma Glucose: 111 mg/dL

## 2018-12-06 MED ORDER — CIPROFLOXACIN HCL 500 MG PO TABS: 500.0000 mg | ORAL_TABLET | Freq: Two times a day (BID) | ORAL | 0 refills | Status: AC

## 2018-12-06 MED ORDER — METRONIDAZOLE 500 MG PO TABS: 500.0000 mg | ORAL_TABLET | Freq: Three times a day (TID) | ORAL | 0 refills | Status: AC

## 2018-12-06 NOTE — Discharge Summary (Signed)
Physician Discharge Summary  [name] JHE:[id number] DOB: [birth year] DOA: 12/04/2018  PCP: Bernerd Limbo, MD  Admit date: 12/04/2018 Discharge date: 12/06/2018  Admitted From: Home Disposition:  Home  Recommendations for Outpatient Follow-up:  1. Follow up with PCP in 1-2 weeks  Discharge Condition:Improved CODE STATUS:Full Diet recommendation: Regular   Brief/Interim Summary: 68 y.o.femalewith medical history significant of lung cancer, kidney cancer status post partial nephrectomyCHF, CKD, fibromyalgia, history of ovarian cancer, hypothyroidism   Presented withnausea vomiting diarrhea for the past 3 to 4 days she was seen by her primary care provider yesterday and was told to go to emergency department secondary to dehydration and abnormal labs [place] diarrhea is watery nonbloody. She has been having worsening right-sided abdominal pain. She is been getting worse over the past 1 week. Has been lethargic she has hard time tolerating p.o. even water. Her primary care provider initially provided her with antibiotics thinking it could be diverticulitis because she had history of this in the past. But she have not had any antibiotics before starting her symptoms. No associated fevers or chills shortness of breath or chest pain but she has been losing some weight for the past few months. Family states she had syncopal episode on the commode yesterday. Reports decreased PO intake No sick contacts, no eating out   Discharge Diagnoses:  Principal Problem:   Colitis Active Problems:   DIARRHEA-PRESUMED INFECTIOUS   Hypothyroidism   Seizure disorder ([place])   Apnea, sleep   Chronic pain syndrome   Hypokalemia   Acute on chronic renal insufficiency   Hypomagnesemia   Dehydration   Weight loss   Pressure injury of skin   Malnutrition of moderate degree  Present on Admission: . Colitis -findings of colitis noted on CT abd per my own review -clinically improved with  cipro and flagyl, transitioned to PO abx -continue IVF hydration per below -Tolerated soft diet  . Hypothyroidism -TSH reviewed, within normal limits  . DIARRHEA-PRESUMED INFECTIOUS  -No prior use of antibiotics will obtain gastric panel hold off on C. difficile testing at this time -Resolved  . Apnea, sleep -does not use CPAP  . Chronic pain syndrome- -resume home meds on discharge -appears stable at present  . Hypokalemia -Potassium remains low at 3.1. Will replace -Repeat bmet in AM  . Hypomagnesemia -replaced overnight  . Dehydration -Remains clinically dehydrated with dry mucus membranes, low serum bicarb and elevated BUN -Continue IVF hydration as tolerated  . Weight loss -patient multiple history of cancers in the past with decreased p.o. intake -Continue to encourage PO intake as tolerated  Recent fall -resulting in facial trauma CT head unremarkable no evidence of intracranial bleeding.  -PT consulted. Recommendation for home health PT  Pressure ulcer -wound care consult ordered pressure ulcer none consistent with patient's history of only being weak for past few days it is possible the patient has been poorly ambulatory for quite some time  ARF -Presenting Cr of 2.29 with baseline Cr of around 1.45 -Cr improved to 1.1 with IVF hydration   Discharge Instructions   Allergies as of 12/06/2018      Reactions   Amoxicillin Other (See Comments)   UNSPECIFIED REACTION  Has patient had a PCN reaction causing immediate rash, facial/tongue/throat swelling, SOB or lightheadedness with hypotension: No Has patient had a PCN reaction causing severe rash involving mucus membranes or skin necrosis: No Has patient had a PCN reaction that required hospitalization No Has patient had a PCN reaction occurring within the last  10 years: No If all of the above answers are "NO", then may proceed with Cephalosporin use.   Ampicillin Rash, Other (See  Comments)   Has patient had a PCN reaction causing immediate rash, facial/tongue/throat swelling, SOB or lightheadedness with hypotension: No Has patient had a PCN reaction causing severe rash involving mucus membranes or skin necrosis: No Has patient had a PCN reaction that required hospitalization No Has patient had a PCN reaction occurring within the last 10 years: No If all of the above answers are "NO", then may proceed with Cephalosporin use.   Buspar [buspirone] Nausea And Vomiting      Medication List    STOP taking these medications   PROAIR HFA 108 (90 Base) MCG/ACT inhaler Generic drug:  albuterol     TAKE these medications   amLODipine 10 MG tablet Commonly known as:  NORVASC Take 10 mg by mouth daily.   buPROPion 150 MG 12 hr tablet Commonly known as:  WELLBUTRIN SR Take 1 tablet (150 mg total) by mouth 2 (two) times daily. What changed:  when to take this   ciprofloxacin 500 MG tablet Commonly known as:  CIPRO Take 1 tablet (500 mg total) by mouth 2 (two) times daily for 6 days. What changed:  when to take this   cyclobenzaprine 5 MG tablet Commonly known as:  FLEXERIL Take 1 tablet (5 mg total) by mouth 3 (three) times daily as needed. For spasms   diclofenac sodium 1 % Gel Commonly known as:  VOLTAREN Apply 2 g topically 2 (two) times daily as needed (pain).   levothyroxine 100 MCG tablet Commonly known as:  SYNTHROID, LEVOTHROID TAKE 1 TABLET BY MOUTH BEFORE BREAKFAST MONDAY-FRIDAY SKIP SATURDAY ANS SUNDAY What changed:    how much to take  how to take this  when to take this  additional instructions   lisinopril 10 MG tablet Commonly known as:  PRINIVIL,ZESTRIL Take 1 tablet by mouth daily.   methocarbamol 500 MG tablet Commonly known as:  ROBAXIN Take 1 tablet (500 mg total) by mouth every 6 (six) hours as needed for muscle spasms.   metroNIDAZOLE 500 MG tablet Commonly known as:  FLAGYL Take 1 tablet (500 mg total) by mouth every 8  (eight) hours for 6 days. What changed:    medication strength  how much to take  when to take this   morphine 30 MG 12 hr tablet Commonly known as:  MS CONTIN Take 1 tablet (30 mg total) by mouth every 12 (twelve) hours.   ondansetron 4 MG tablet Commonly known as:  ZOFRAN Take 4 mg by mouth 3 (three) times daily as needed for nausea/vomiting.   polyethylene glycol packet Commonly known as:  MIRALAX / GLYCOLAX Take 17 g by mouth 2 (two) times daily. What changed:    when to take this  reasons to take this   traMADol 50 MG tablet Commonly known as:  ULTRAM Take 1-2 tablets (50-100 mg total) by mouth every 6 (six) hours as needed (mild pain).   triamterene-hydrochlorothiazide 75-50 MG tablet Commonly known as:  MAXZIDE Take 1 tablet by mouth daily.   TYLENOL PO Take 2 tablets by mouth daily as needed (pain).   vitamin B-12 1000 MCG tablet Commonly known as:  CYANOCOBALAMIN Take 1 tablet by mouth daily.   Vitamin D (Ergocalciferol) 1.25 MG (50000 UT) Caps capsule Commonly known as:  DRISDOL Take 50,000 Units by mouth every 7 (seven) days.   zolpidem 10 MG tablet Commonly known as:  AMBIEN Take 10 mg by mouth at bedtime. What changed:  Another medication with the same name was removed. Continue taking this medication, and follow the directions you see here.       Allergies  Allergen Reactions  . Amoxicillin Other (See Comments)    UNSPECIFIED REACTION  Has patient had a PCN reaction causing immediate rash, facial/tongue/throat swelling, SOB or lightheadedness with hypotension: No Has patient had a PCN reaction causing severe rash involving mucus membranes or skin necrosis: No Has patient had a PCN reaction that required hospitalization No Has patient had a PCN reaction occurring within the last 10 years: No If all of the above answers are "NO", then may proceed with Cephalosporin use.  . Ampicillin Rash and Other (See Comments)    Has patient had a PCN  reaction causing immediate rash, facial/tongue/throat swelling, SOB or lightheadedness with hypotension: No Has patient had a PCN reaction causing severe rash involving mucus membranes or skin necrosis: No Has patient had a PCN reaction that required hospitalization No Has patient had a PCN reaction occurring within the last 10 years: No If all of the above answers are "NO", then may proceed with Cephalosporin use.  [place] [Buspirone] Nausea And Vomiting    Procedures/Studies: Ct Abdomen Pelvis Wo Contrast  Result Date: 12/04/2018 CLINICAL DATA:  Initial evaluation for acute generalized abdominal pain, vomiting, diarrhea. EXAM: CT ABDOMEN AND PELVIS WITHOUT CONTRAST TECHNIQUE: Multidetector CT imaging of the abdomen and pelvis was performed following the standard protocol without IV contrast. COMPARISON:  Prior CT from 08/24/2017 FINDINGS: Lower chest: Visualized lung bases are clear. Hepatobiliary: Limited noncontrast evaluation of the liver is unremarkable. Gallbladder contracted without acute finding. Common bile duct dilated up to 10 mm. No visible obstructive stones or other abnormality. This is similar to previous. No appreciable intrahepatic biliary dilatation on this noncontrast examination. Pancreas: Diffuse fatty infiltration the pancreas noted. Pancreas otherwise unremarkable. Spleen: Spleen within normal limits. Adrenals/Urinary Tract: Adrenal glands are normal. Kidneys fairly equal in size. No nephrolithiasis or hydronephrosis. Approximate 14 mm intermediate density cystic lesion at the lower pole left kidney is grossly similar to previous. Fat containing lesion within the interpolar right kidney grossly stable as well. No appreciable hydroureter bladder moderately distended without acute finding. Stomach/Bowel: Stomach within normal limits. No evidence for bowel obstruction. Appendix within normal limits. Mild diffuse gaseous distension of the transverse colon. Associated wall thickening  about the transverse and proximal descending colon, suggesting possible acute colitis. No other significant inflammatory changes about the bowels. Vascular/Lymphatic: Moderate aorto bi-iliac atherosclerotic disease. No aneurysm. Shotty subcentimeter retroperitoneal lymph nodes noted. No pathologically enlarged intra-abdominal or pelvic lymph nodes identified. Reproductive: Uterus is absent.  Ovaries not confidently identified. Other: No free air or fluid. Musculoskeletal: No acute osseous abnormality. No discrete lytic or blastic osseous lesions. Extensive postoperative changes noted within the lumbar spine. Patient status post vertebral augmentation at L1. IMPRESSION: 1. Abnormal wall thickening about the mildly dilated transverse and proximal descending colon, suggesting acute colitis. 2. Mild extrahepatic biliary dilatation, similar relative to previous exam. No obstructive stone or other finding identified. Correlation with liver function tests suggested. 3. Moderate aorto bi-iliac atherosclerotic disease. Electronically Signed   By: Jeannine Boga M.D.   On: 12/04/2018 18:03   Dg Chest 2 View  Result Date: 12/04/2018 CLINICAL DATA:  Vomiting and diarrhea for 3-4 days, dehydration, former smoker, CHF, history of bearing cancer EXAM: CHEST - 2 VIEW COMPARISON:  08/28/2018 FINDINGS: Normal heart size, mediastinal contours, and pulmonary  vascularity. Postsurgical changes RIGHT upper lobe. Atherosclerotic calcification aorta. Lungs appear hyperinflated but otherwise clear. No infiltrate, pleural effusion, or pneumothorax. Bones demineralized with note of a LEFT shoulder prosthesis and chronic RIGHT rotator cuff tear. IMPRESSION: Hyperinflated lungs with postsurgical changes in RIGHT upper lobe. No acute abnormalities. Electronically Signed   By: Lavonia Dana M.D.   On: 12/04/2018 19:45   Ct Head Wo Contrast  Result Date: 12/04/2018 CLINICAL DATA:  Lethargic. Altered mental status. EXAM: CT HEAD WITHOUT  CONTRAST TECHNIQUE: Contiguous axial images were obtained from the base of the skull through the vertex without intravenous contrast. COMPARISON:  CT head 10/06/2013. FINDINGS: Brain: No evidence for acute infarction, hemorrhage, mass lesion, hydrocephalus, or extra-axial fluid. Normal for age cerebral volume. Mild hypoattenuation of white matter, likely mild small vessel disease. Vascular: Calcification of the cavernous internal carotid arteries consistent with cerebrovascular atherosclerotic disease. No signs of intracranial large vessel occlusion. Skull: Calvarium intact Sinuses/Orbits: Negative orbits. No acute finding. Other: None. Compared with priors, similar appearance. IMPRESSION: Mild atrophy and small vessel disease. No acute intracranial findings. Electronically Signed   By: Staci Righter M.D.   On: 12/04/2018 20:53     Subjective: Eager to go home. Feels better today  Discharge Exam: Vitals:   12/05/18 2044 12/06/18 0610  BP: 121/64 (!) 141/64  Pulse: 98 98  Resp: 16 16  Temp: 98.7 F (37.1 C) 98.6 F (37 C)  SpO2: 97% 99%   Vitals:   12/05/18 0504 12/05/18 1425 12/05/18 2044 12/06/18 0610  BP: (!) 111/56 121/68 121/64 (!) 141/64  Pulse: 85 (!) 101 98 98  Resp: 18  16 16   Temp: 97.7 F (36.5 C) 98.4 F (36.9 C) 98.7 F (37.1 C) 98.6 F (37 C)  TempSrc: Oral Oral Oral Oral  SpO2: 99% 100% 97% 99%  Weight:      Height:        General: Pt is alert, awake, not in acute distress Cardiovascular: RRR, S1/S2 +, no rubs, no gallops Respiratory: CTA bilaterally, no wheezing, no rhonchi Abdominal: Soft, NT, ND, bowel sounds + Extremities: no edema, no cyanosis   The results of significant diagnostics from this hospitalization (including imaging, microbiology, ancillary and laboratory) are listed below for reference.     Microbiology: No results found for this or any previous visit (from the past 240 hour(s)).   Labs: BNP (last 3 results) No results for input(s): BNP  in the last 8760 hours. Basic Metabolic Panel: Recent Labs  Lab 12/04/18 1304 12/04/18 1309 12/05/18 0417 12/06/18 0418  NA 134*  --  133* 136  K 3.3*  --  3.1* 4.1  CL 101  --  104 108  CO2 20*  --  18* 19*  GLUCOSE 182*  --  107* 111*  BUN 109*  --  78* 40*  CREATININE 2.29*  --  1.48* 1.12*  CALCIUM 8.4*  --  7.6* 8.0*  MG  --  1.3* 1.7  --   PHOS  --  4.2 3.3  --    Liver Function Tests: Recent Labs  Lab 12/04/18 1304 12/05/18 0417 12/06/18 0418  AST 18 14* 16  ALT 19 18 16   ALKPHOS 62 51 55  BILITOT 0.8 0.7 0.7  PROT 6.6 5.2* 5.2*  ALBUMIN 3.0* 2.4* 2.4*   Recent Labs  Lab 12/04/18 1304  LIPASE 26   No results for input(s): AMMONIA in the last 168 hours. CBC: Recent Labs  Lab 12/04/18 1304 12/05/18 0417 12/06/18 0737  WBC 16.0* 13.4* 11.7*  HGB 11.4* 9.7* 9.6*  HCT 34.9* 31.3* 30.0*  MCV 90.9 92.1 91.7  PLT 197 176 179   Cardiac Enzymes: No results for input(s): CKTOTAL, CKMB, CKMBINDEX, TROPONINI in the last 168 hours. BNP: Invalid input(s): POCBNP CBG: Recent Labs  Lab 12/04/18 2341 12/05/18 0501 12/05/18 0829  GLUCAP 130* 105* 103*   D-Dimer No results for input(s): DDIMER in the last 72 hours. Hgb A1c Recent Labs    12/04/18 2312  HGBA1C 5.5   Lipid Profile No results for input(s): CHOL, HDL, LDLCALC, TRIG, CHOLHDL, LDLDIRECT in the last 72 hours. Thyroid function studies Recent Labs    12/05/18 0417  TSH 1.920   Anemia work up No results for input(s): VITAMINB12, FOLATE, FERRITIN, TIBC, IRON, RETICCTPCT in the last 72 hours. Urinalysis    Component Value Date/Time   COLORURINE YELLOW 12/04/2018 1243   APPEARANCEUR CLEAR 12/04/2018 1243   LABSPEC 1.011 12/04/2018 1243   PHURINE 5.0 12/04/2018 1243   GLUCOSEU NEGATIVE 12/04/2018 1243   HGBUR NEGATIVE 12/04/2018 1243   BILIRUBINUR NEGATIVE 12/04/2018 1243   KETONESUR NEGATIVE 12/04/2018 1243   PROTEINUR NEGATIVE 12/04/2018 1243   UROBILINOGEN 1.0 07/29/2015 0939    NITRITE NEGATIVE 12/04/2018 1243   LEUKOCYTESUR NEGATIVE 12/04/2018 1243   Sepsis Labs Invalid input(s): PROCALCITONIN,  WBC,  LACTICIDVEN Microbiology No results found for this or any previous visit (from the past 240 hour(s)).  Time spent:30 min  SIGNED:   Marylu Lund, MD  [facility] 12/06/2018, 12:20 PM  If 7PM-7AM, please contact night-coverage

## 2018-12-11 ENCOUNTER — Encounter: Payer: Self-pay | Admitting: Family Medicine

## 2018-12-12 ENCOUNTER — Inpatient Hospital Stay (HOSPITAL_COMMUNITY): Admission: RE | Admit: 2018-12-12 | Payer: Medicare Other | Source: Ambulatory Visit

## 2018-12-16 ENCOUNTER — Other Ambulatory Visit: Payer: Self-pay | Admitting: Family Medicine

## 2018-12-16 DIAGNOSIS — F13930 Sedative, hypnotic or anxiolytic use, unspecified with withdrawal, uncomplicated: Secondary | ICD-10-CM

## 2018-12-16 DIAGNOSIS — F13230 Sedative, hypnotic or anxiolytic dependence with withdrawal, uncomplicated: Secondary | ICD-10-CM

## 2018-12-17 ENCOUNTER — Telehealth (HOSPITAL_COMMUNITY): Payer: Self-pay | Admitting: *Deleted

## 2018-12-17 DIAGNOSIS — K529 Noninfective gastroenteritis and colitis, unspecified: Secondary | ICD-10-CM | POA: Diagnosis not present

## 2018-12-17 DIAGNOSIS — I10 Essential (primary) hypertension: Secondary | ICD-10-CM | POA: Diagnosis not present

## 2018-12-17 DIAGNOSIS — R197 Diarrhea, unspecified: Secondary | ICD-10-CM | POA: Diagnosis not present

## 2018-12-17 DIAGNOSIS — G894 Chronic pain syndrome: Secondary | ICD-10-CM | POA: Diagnosis not present

## 2018-12-17 DIAGNOSIS — F411 Generalized anxiety disorder: Secondary | ICD-10-CM | POA: Diagnosis not present

## 2018-12-17 NOTE — Telephone Encounter (Signed)
Left message on voicemail in reference to upcoming appointment scheduled for 12/20/18. Phone number given for a call back so details instructions can be given. [name], [name]

## 2018-12-20 ENCOUNTER — Encounter (HOSPITAL_COMMUNITY): Payer: Medicare Other

## 2018-12-20 ENCOUNTER — Ambulatory Visit: Payer: Medicare Other | Admitting: Cardiology

## 2018-12-20 ENCOUNTER — Telehealth: Payer: Self-pay | Admitting: Internal Medicine

## 2018-12-20 NOTE — Telephone Encounter (Signed)
Pt states she was recently in the hospital for colitis. Reports she has seen her PCP and was told to follow-up with GI. Pt scheduled to see Amy Esterwood PA 12/25/18@1 :30pm, pt aware of appt.

## 2018-12-23 DIAGNOSIS — D649 Anemia, unspecified: Secondary | ICD-10-CM | POA: Diagnosis not present

## 2018-12-23 DIAGNOSIS — I10 Essential (primary) hypertension: Secondary | ICD-10-CM | POA: Diagnosis not present

## 2018-12-23 DIAGNOSIS — K529 Noninfective gastroenteritis and colitis, unspecified: Secondary | ICD-10-CM | POA: Diagnosis not present

## 2018-12-23 DIAGNOSIS — R5383 Other fatigue: Secondary | ICD-10-CM | POA: Diagnosis not present

## 2018-12-23 DIAGNOSIS — R197 Diarrhea, unspecified: Secondary | ICD-10-CM | POA: Diagnosis not present

## 2018-12-24 NOTE — ED Notes (Signed)
Per GI MD, recently hospitalized for ARF on 2/12-states frequent loose stools-states creatine level elevated-coming to ED for fluids, possible admission

## 2018-12-25 ENCOUNTER — Emergency Department (HOSPITAL_COMMUNITY): Payer: Medicare Other

## 2018-12-25 ENCOUNTER — Encounter (HOSPITAL_COMMUNITY): Payer: Self-pay | Admitting: Emergency Medicine

## 2018-12-25 ENCOUNTER — Emergency Department (HOSPITAL_COMMUNITY)
Admission: EM | Admit: 2018-12-25 | Discharge: 2018-12-25 | Disposition: A | Discharge status: Home / Self Care | Payer: Medicare Other | Attending: Emergency Medicine | Admitting: Emergency Medicine

## 2018-12-25 ENCOUNTER — Other Ambulatory Visit: Payer: Self-pay

## 2018-12-25 ENCOUNTER — Ambulatory Visit: Payer: Medicare Other | Admitting: Physician Assistant

## 2018-12-25 DIAGNOSIS — Z85118 Personal history of other malignant neoplasm of bronchus and lung: Secondary | ICD-10-CM | POA: Insufficient documentation

## 2018-12-25 DIAGNOSIS — Z85828 Personal history of other malignant neoplasm of skin: Secondary | ICD-10-CM | POA: Diagnosis not present

## 2018-12-25 DIAGNOSIS — Z87891 Personal history of nicotine dependence: Secondary | ICD-10-CM | POA: Diagnosis not present

## 2018-12-25 DIAGNOSIS — Z79899 Other long term (current) drug therapy: Secondary | ICD-10-CM | POA: Diagnosis not present

## 2018-12-25 DIAGNOSIS — I509 Heart failure, unspecified: Secondary | ICD-10-CM | POA: Diagnosis not present

## 2018-12-25 DIAGNOSIS — S79911A Unspecified injury of right hip, initial encounter: Secondary | ICD-10-CM | POA: Diagnosis not present

## 2018-12-25 DIAGNOSIS — J90 Pleural effusion, not elsewhere classified: Secondary | ICD-10-CM | POA: Diagnosis not present

## 2018-12-25 DIAGNOSIS — Z85528 Personal history of other malignant neoplasm of kidney: Secondary | ICD-10-CM | POA: Diagnosis not present

## 2018-12-25 DIAGNOSIS — F419 Anxiety disorder, unspecified: Secondary | ICD-10-CM | POA: Diagnosis not present

## 2018-12-25 DIAGNOSIS — N189 Chronic kidney disease, unspecified: Secondary | ICD-10-CM | POA: Diagnosis not present

## 2018-12-25 DIAGNOSIS — M25551 Pain in right hip: Secondary | ICD-10-CM | POA: Diagnosis not present

## 2018-12-25 DIAGNOSIS — E039 Hypothyroidism, unspecified: Secondary | ICD-10-CM | POA: Diagnosis not present

## 2018-12-25 DIAGNOSIS — R197 Diarrhea, unspecified: Secondary | ICD-10-CM | POA: Diagnosis present

## 2018-12-25 DIAGNOSIS — D649 Anemia, unspecified: Secondary | ICD-10-CM | POA: Insufficient documentation

## 2018-12-25 DIAGNOSIS — Z96612 Presence of left artificial shoulder joint: Secondary | ICD-10-CM | POA: Diagnosis not present

## 2018-12-25 DIAGNOSIS — Z96653 Presence of artificial knee joint, bilateral: Secondary | ICD-10-CM | POA: Insufficient documentation

## 2018-12-25 DIAGNOSIS — Z8543 Personal history of malignant neoplasm of ovary: Secondary | ICD-10-CM | POA: Diagnosis not present

## 2018-12-25 DIAGNOSIS — F329 Major depressive disorder, single episode, unspecified: Secondary | ICD-10-CM | POA: Diagnosis not present

## 2018-12-25 LAB — URINALYSIS, ROUTINE W REFLEX MICROSCOPIC
Bilirubin Urine: NEGATIVE
Glucose, UA: NEGATIVE mg/dL
Hgb urine dipstick: NEGATIVE
Ketones, ur: NEGATIVE mg/dL
Leukocytes,Ua: NEGATIVE
Nitrite: NEGATIVE
Protein, ur: NEGATIVE mg/dL
Specific Gravity, Urine: 1.012 (ref 1.005–1.030)
pH: 5 (ref 5.0–8.0)

## 2018-12-25 LAB — FERRITIN: Ferritin: 106 ng/mL (ref 11–307)

## 2018-12-25 LAB — COMPREHENSIVE METABOLIC PANEL
ALT: 17 U/L (ref 0–44)
AST: 20 U/L (ref 15–41)
Albumin: 2.5 g/dL — ABNORMAL LOW (ref 3.5–5.0)
Alkaline Phosphatase: 59 U/L (ref 38–126)
Anion gap: 10 (ref 5–15)
BUN: 31 mg/dL — ABNORMAL HIGH (ref 8–23)
CO2: 21 mmol/L — ABNORMAL LOW (ref 22–32)
Calcium: 8.9 mg/dL (ref 8.9–10.3)
Chloride: 106 mmol/L (ref 98–111)
Creatinine, Ser: 1.48 mg/dL — ABNORMAL HIGH (ref 0.44–1.00)
GFR calc Af Amer: 41 mL/min — ABNORMAL LOW (ref >60)
GFR calc non Af Amer: 36 mL/min — ABNORMAL LOW (ref >60)
Glucose, Bld: 102 mg/dL — ABNORMAL HIGH (ref 70–99)
Potassium: 4.2 mmol/L (ref 3.5–5.1)
Sodium: 137 mmol/L (ref 135–145)
Total Bilirubin: 0.8 mg/dL (ref 0.3–1.2)
Total Protein: 6.3 g/dL — ABNORMAL LOW (ref 6.5–8.1)

## 2018-12-25 LAB — PROTIME-INR
INR: 1.2 (ref 0.8–1.2)
Prothrombin Time: 15.2 seconds (ref 11.4–15.2)

## 2018-12-25 LAB — CBC
HCT: 29.6 % — ABNORMAL LOW (ref 36.0–46.0)
Hemoglobin: 8.8 g/dL — ABNORMAL LOW (ref 12.0–15.0)
MCH: 28.9 pg (ref 26.0–34.0)
MCHC: 29.7 g/dL — ABNORMAL LOW (ref 30.0–36.0)
MCV: 97 fL (ref 80.0–100.0)
Platelets: 444 10*3/uL — ABNORMAL HIGH (ref 150–400)
RBC: 3.05 MIL/uL — ABNORMAL LOW (ref 3.87–5.11)
RDW: 16.7 % — ABNORMAL HIGH (ref 11.5–15.5)
WBC: 12.8 10*3/uL — ABNORMAL HIGH (ref 4.0–10.5)
nRBC: 0 % (ref 0.0–0.2)

## 2018-12-25 LAB — IRON AND TIBC
Iron: 28 ug/dL (ref 28–170)
Saturation Ratios: 18 % (ref 10.4–31.8)
TIBC: 158 ug/dL — ABNORMAL LOW (ref 250–450)
UIBC: 130 ug/dL

## 2018-12-25 LAB — FOLATE: Folate: 16.2 ng/mL (ref >5.9)

## 2018-12-25 LAB — RETICULOCYTES
Immature Retic Fract: 19.3 % — ABNORMAL HIGH (ref 2.3–15.9)
RBC.: 2.95 MIL/uL — ABNORMAL LOW (ref 3.87–5.11)
Retic Count, Absolute: 52.8 10*3/uL (ref 19.0–186.0)
Retic Ct Pct: 1.8 % (ref 0.4–3.1)

## 2018-12-25 LAB — POC OCCULT BLOOD, ED: Fecal Occult Bld: NEGATIVE

## 2018-12-25 LAB — VITAMIN B12: Vitamin B-12: 1192 pg/mL — ABNORMAL HIGH (ref 180–914)

## 2018-12-25 LAB — PREPARE RBC (CROSSMATCH)

## 2018-12-25 MED ORDER — SODIUM CHLORIDE 0.9 % IV SOLN
1000.0000 mL | INTRAVENOUS | Status: DC
  Administered 2018-12-25: 1000 mL via INTRAVENOUS

## 2018-12-25 MED ORDER — SODIUM CHLORIDE 0.9% IV SOLUTION: Freq: Once | INTRAVENOUS | Status: DC

## 2018-12-25 MED ORDER — SODIUM CHLORIDE 0.9 % IV BOLUS (SEPSIS)
1000.0000 mL | Freq: Once | INTRAVENOUS | Status: AC
  Administered 2018-12-25: 1000 mL via INTRAVENOUS

## 2018-12-25 NOTE — ED Triage Notes (Addendum)
Patient here from home with complaints of abnormal labs, told to come to the ER. HBG 8.0. Also reports kidney function is elevated. Dehydration. Frequent falls, last fall 2 days ago, reports right hip pain. Hx of same, states that she was admitted to the hospital last month.

## 2018-12-25 NOTE — ED Provider Notes (Signed)
Assumed care from Dr. Tomi Bamberger at 5 PM, patient awaiting transfusion of 1 unit of blood and then to be discharged home.  Came to the ED from GI office with lab abnormalities.  Patient has mild anemia as compared to her baseline.  Does not have any signs of active bleeding, normal stool.  GI is on board with 1 unit of blood and follow-up outpatient.  Patient overall with unremarkable work-up.  Awaiting blood transfusion and then okay for discharge home.  Patient got blood transfusion without any issues.  Discharged from ED in good condition and recommend follow-up with primary care doctor for repeat labs next week.  This chart was dictated using voice recognition software.  Despite best efforts to proofread,  errors can occur which can change the documentation meaning.     Lennice Sites, DO 12/25/18 1850

## 2018-12-25 NOTE — Discharge Planning (Signed)
Verniece Encarnacion J. Clydene Laming, RN, BSN, [employer] [phone] Spoke with pt spouse via telephone regarding discharge planning for [employer]. Offered pt list of home health agencies to choose from.  Pt chose [facility] to render services. Glyn Ade of [facility] notified. Patient made aware that [facility] will be in contact in 24-48 hours.  No DME needs identified at this time.

## 2018-12-25 NOTE — ED Provider Notes (Addendum)
[facility] DEPT Provider Note   CSN: [id number] Arrival date & time: 12/25/18  0731    History   Chief Complaint Chief Complaint  Patient presents with  . Abnormal Lab  . Fall  . Hip Pain    HPI [name] is a 68 y.o. female.     HPI Patient presents to the emergency room for evaluation of abnormal laboratory values.  Patient has history of colitis.  She was admitted to the hospital mid February.  Patient was treated for colitis with Cipro and Flagyl.  Patient states she had significant diarrhea and that persisted even after leaving the hospital.  Over the last couple of days however her diarrhea has finally stopped.  Patient has continued to feel weak.  She has fallen a few times.  She went to her GI doctor yesterday and was told to come to the ED after having abnormal laboratory tests.  Patient denies any fevers or chills.  She is not having any abdominal pain.  She has not noticed any blood in her stool.  She does have some soreness in her right hip after her falls. Past Medical History:  Diagnosis Date  . Anemia   . Anxiety   . Arthritis    "everywhere" (08/19/2013)  . Bilateral carpal tunnel syndrome   . Chest pain at rest, rule out pericarditis 11/27/2012  . CHF (congestive heart failure) ([place])   . Chronic kidney disease    dr Risa Grill  . Chronic lower back pain   . Complication of anesthesia    "woke up twice during knee replacement" (08/19/2013)  . Depression   . Dyspnea    w/ exertion    . Fibromyalgia   . Headache(784.0)    hx of migraines   . History of pericarditis, with pericardial window in 2009 11/27/2012  . Hx of ovarian cancer 11/27/2012  . Hypothyroidism 11/27/2012  . Iron deficiency anemia   . Kidney carcinoma ([place])    "both kidneys; ~ 3 yr apart" (08/19/2013)  . Lung cancer ([place]) 2018  . Melanoma of back ([place])   . Migraines   . Pneumonia    "several times; including today", RUL/notes 08/19/2013  . Squamous carcinoma      "face, side of my nose, chest; used acid to get rid of them" (08/19/2013)  . Venous insufficiency 11/27/2012    Patient Active Problem List   Diagnosis Date Noted  . Malnutrition of moderate degree 12/06/2018  . Pressure injury of skin 12/05/2018  . Hypomagnesemia 12/04/2018  . Dehydration 12/04/2018  . Weight loss 12/04/2018  . Colitis 12/04/2018  . 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 Benzodiazepine withdrawal without complication ([place]) 08/65/7846  . Hypokalemia 03/13/2018  . Acute on chronic renal insufficiency 03/13/2018  . Generalized anxiety disorder 03/13/2018  . Acute cognitive decline 03/13/2018  . Adenocarcinoma of right lung, stage 1 ([place]) 10/22/2017  . Chronic pain syndrome 07/25/2017  . S/P left TKA 08/02/2015  . S/P knee replacement 08/02/2015  . Carpal tunnel syndrome 04/23/2015  . Autoimmune disease ([place]) 03/29/2013  . Apnea, sleep 03/29/2013  . Anxiety 11/28/2012  . Seizure disorder ([place]) 11/28/2012  . Anxiety state 11/28/2012  . History of pericarditis, with pericardial window in 2009, and recurrent episodes 2011,2013, treated with methotrexate 11/27/2012  . SOB (shortness of breath) 11/27/2012  . Hypothyroidism 11/27/2012  . History of partial nephrectomy, both rt. and lt 11/27/2012  . Hx of ovarian cancer 11/27/2012  . Venous insufficiency 11/27/2012  .  Chronic back pain, hx of lumbar spondylosis and stenosis L5-S1 with hx decompression and total diskectomy 11/27/2012  . Hx of melanoma of skin 11/27/2012  . 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 Chronic venous insufficiency 11/27/2012  . DIARRHEA-PRESUMED INFECTIOUS 12/27/2009    Past Surgical History:  Procedure Laterality Date  . ABDOMINAL HYSTERECTOMY  1979  . ANTERIOR LAT LUMBAR FUSION Left 07/16/2018   Procedure: Left Lumbar two-three  Anterolateral decompression/interbody fusion with lateral plate fixation;  Surgeon: Kristeen Miss, MD;  Location: [facility];  Service:  Neurosurgery;  Laterality: Left;  . APPENDECTOMY    . DOPPLER ECHOCARDIOGRAPHY  02/23/2011   LVEF>50%  . FRACTURE SURGERY    . KNEE ARTHROSCOPY Right    "twice before the replacement" (08/19/2013)  . LEFT OOPHORECTOMY Left 1979  . [place]   "ruptured disc"  . MELANOMA EXCISION  ~ 2010   "my lower back" (08/19/2013)  . ORIF HIP FRACTURE Left    x 2 age 49 and 4  . PARTIAL NEPHRECTOMY Right 2005; ~ 2008  . PERICARDIAL WINDOW  ~ 2012  . POSTERIOR LUMBAR FUSION  2002?; 03/2012  . REDUCTION MAMMAPLASTY Bilateral ~ 2008  . RIGHT OOPHORECTOMY  ~ 2007   "it had cancer in it" (08/19/2013)  . SHOULDER ARTHROSCOPY W/ ROTATOR CUFF REPAIR Right 1990's  . TONSILLECTOMY  1954  . TOTAL KNEE ARTHROPLASTY Right 1990's  . TOTAL KNEE ARTHROPLASTY Left 08/02/2015   Procedure: LEFT TOTAL KNEE ARTHROPLASTY;  Surgeon: Paralee Cancel, MD;  Location: [facility];  Service: Orthopedics;  Laterality: Left;  . TOTAL SHOULDER REPLACEMENT Left 2006?  . TUBAL LIGATION  1952  . VIDEO ASSISTED THORACOSCOPY (VATS)/WEDGE RESECTION Right 09/28/2017   Procedure: RIGHT VIDEO ASSISTED THORACOSCOPY (VATS)/WEDGE RESECTION, LYMPH NODE DISSECTION ;  Surgeon: Melrose Nakayama, MD;  Location: [facility];  Service: Thoracic;  Laterality: Right;     OB History   No obstetric history on file.      Home Medications    Prior to Admission medications   Medication Sig Start Date End Date Taking? Authorizing Provider  acetaminophen (TYLENOL) 325 MG tablet Take 650 mg by mouth daily as needed (pain).    Yes [provider]  buPROPion (WELLBUTRIN SR) 150 MG 12 hr tablet TAKE 1 TABLET BY MOUTH DAILY 12/16/18  Yes Stallings, Zoe A, MD  busPIRone (BUSPAR) 15 MG tablet Take 15 mg by mouth 2 (two) times daily. 12/17/18  Yes [provider]  diclofenac sodium (VOLTAREN) 1 % GEL Apply 2 g topically 2 (two) times daily as needed (pain).  10/25/16  Yes [provider]  diphenoxylate-atropine (LOMOTIL)  2.5-0.025 MG tablet Take 2 tablets by mouth 4 (four) times daily as needed for diarrhea or loose stools.  12/19/18  Yes [provider]  levothyroxine (SYNTHROID, LEVOTHROID) 100 MCG tablet TAKE 1 TABLET BY MOUTH BEFORE BREAKFAST MONDAY-FRIDAY SKIP SATURDAY ANS SUNDAY Patient taking differently: Take 100 mcg by mouth daily.  04/26/18  Yes Forrest Moron, MD  methocarbamol (ROBAXIN) 500 MG tablet Take 1 tablet (500 mg total) by mouth every 6 (six) hours as needed for muscle spasms. 07/18/18  Yes Kristeen Miss, MD  morphine (MS CONTIN) 30 MG 12 hr tablet Take 1 tablet (30 mg total) by mouth every 12 (twelve) hours. 07/18/18  Yes Kristeen Miss, MD  ondansetron (ZOFRAN) 4 MG tablet Take 4 mg by mouth 3 (three) times daily as needed for nausea/vomiting. 12/03/18  Yes [provider]  polyethylene glycol (MIRALAX / GLYCOLAX) packet Take 17 g by mouth 2 (two) times daily. Patient taking differently: Take 17 g by mouth daily as needed for moderate constipation.  08/03/15  Yes Babish, Rodman Key, PA-C  vitamin B-12 (CYANOCOBALAMIN) 1000 MCG tablet Take 1 tablet by mouth daily.   Yes [provider]  Vitamin D, Ergocalciferol, (DRISDOL) 1.25 MG (50000 UT) CAPS capsule Take 50,000 Units by mouth every 7 (seven) days.  11/15/18 02/01/19 Yes [provider]  zolpidem (AMBIEN) 10 MG tablet Take 10 mg by mouth at bedtime. 12/02/18  Yes [provider]  cyclobenzaprine (FLEXERIL) 5 MG tablet Take 1 tablet (5 mg total) by mouth 3 (three) times daily as needed. For spasms Patient not taking: Reported on 12/25/2018 08/03/15   Danae Orleans, PA-C  traMADol (ULTRAM) 50 MG tablet Take 1-2 tablets (50-100 mg total) by mouth every 6 (six) hours as needed (mild pain). Patient not taking: Reported on 12/25/2018 10/02/17   [name] Giovanni, PA-C    Family History Family History  Problem Relation Age of Onset  . Cancer Mother 27       Stomach  . Cancer Father 6       Lung cancer  . Bipolar  disorder Sister     Social History Social History   Tobacco Use  . Smoking status: Former Smoker    Packs/day: 1.50    Years: 18.00    Pack years: 27.00    Types: Cigarettes    Last attempt to quit: 09/22/1985    Years since quitting: 33.2  . Smokeless tobacco: Never Used  Substance Use Topics  . Alcohol use: No  . Drug use: No    Comment: prescribed     Allergies   Amoxicillin; Ampicillin; and Buspar [buspirone]   Review of Systems Review of Systems  All other systems reviewed and are negative.    Physical Exam Updated Vital Signs BP 119/77   Pulse 86   Temp 98.2 F (36.8 C)   Resp 16   SpO2 100%   Physical Exam Vitals signs and nursing note reviewed.  Constitutional:      General: She is not in acute distress.    Appearance: She is well-developed.  HENT:     Head: Normocephalic and atraumatic.     Right Ear: External ear normal.     Left Ear: External ear normal.  Eyes:     General: No scleral icterus.       Right eye: No discharge.        Left eye: No discharge.     Conjunctiva/sclera: Conjunctivae normal.  Neck:     Musculoskeletal: Neck supple.     Trachea: No tracheal deviation.  Cardiovascular:     Rate and Rhythm: Normal rate and regular rhythm.  Pulmonary:     Effort: Pulmonary effort is normal. No respiratory distress.     Breath sounds: Normal breath sounds. No stridor. No wheezing or rales.  Abdominal:     General: Bowel sounds are normal. There is no distension.     Palpations: Abdomen is soft.     Tenderness: There is no abdominal tenderness. There is no guarding or rebound.  Musculoskeletal:        General: No tenderness.  Skin:    General: Skin is warm and dry.     Findings: No rash.  Neurological:     Mental Status: She is alert.     Cranial Nerves: No cranial nerve deficit (no facial droop,  extraocular movements intact, no slurred speech).     Sensory: No sensory deficit.     Motor: No abnormal muscle tone or seizure  activity.     Coordination: Coordination normal.      ED Treatments / Results  Labs (all labs ordered are listed, but only abnormal results are displayed) Labs Reviewed  COMPREHENSIVE METABOLIC PANEL - Abnormal; Notable for the following components:      Result Value   CO2 21 (*)    Glucose, Bld 102 (*)    BUN 31 (*)    Creatinine, Ser 1.48 (*)    Total Protein 6.3 (*)    Albumin 2.5 (*)    GFR calc non Af Amer 36 (*)    GFR calc Af Amer 41 (*)    All other components within normal limits  CBC - Abnormal; Notable for the following components:   WBC 12.8 (*)    RBC 3.05 (*)    Hemoglobin 8.8 (*)    HCT 29.6 (*)    MCHC 29.7 (*)    RDW 16.7 (*)    Platelets 444 (*)    All other components within normal limits  VITAMIN B12 - Abnormal; Notable for the following components:   Vitamin B-12 1,192 (*)    All other components within normal limits  IRON AND TIBC - Abnormal; Notable for the following components:   TIBC 158 (*)    All other components within normal limits  RETICULOCYTES - Abnormal; Notable for the following components:   RBC. 2.95 (*)    Immature Retic Fract 19.3 (*)    All other components within normal limits  PROTIME-INR  FERRITIN  URINALYSIS, ROUTINE W REFLEX MICROSCOPIC  FOLATE  POC OCCULT BLOOD, ED  TYPE AND SCREEN  PREPARE RBC (CROSSMATCH)    EKG None  Radiology Dg Chest 2 View  Result Date: 12/25/2018 CLINICAL DATA:  Eluded white blood count.  History of lung cancer. EXAM: CHEST - 2 VIEW COMPARISON:  Chest x-rays dated 12/04/2018 and 10/28/2017 FINDINGS: The heart size and pulmonary vascularity are normal. There are new small bilateral pleural effusions. Chronic linear scarring and surgical staples in the right upper lung zone. Lungs are otherwise clear. No acute bone abnormality. Arthritic changes of the right shoulder. Left total shoulder prosthesis. IMPRESSION: 1. New small nonspecific bilateral pleural effusions only seen on the lateral view. 2.  No other significant abnormalities. Electronically Signed   By: Lorriane Shire M.D.   On: 12/25/2018 11:08   Dg Hip Unilat W Or Wo Pelvis 2-3 Views Right  Result Date: 12/25/2018 CLINICAL DATA:  Fall 2 days ago with right hip pain, initial encounter EXAM: DG HIP (WITH OR WITHOUT PELVIS) 2-3V RIGHT COMPARISON:  12/04/2018 CT of the abdomen and pelvis FINDINGS: Pelvic ring is intact. Degenerative changes of the right hip joint are noted with some remodeling of the femoral head. No definitive fracture is seen at this time. Postsurgical changes in the lumbar spine are noted. No soft tissue abnormality is seen. IMPRESSION: Degenerative change without acute abnormality. Electronically Signed   By: Inez Catalina M.D.   On: 12/25/2018 09:10    Procedures Procedures (including critical care time)  Medications Ordered in ED Medications  sodium chloride 0.9 % bolus 1,000 mL (0 mLs Intravenous Stopped 12/25/18 1016)    Followed by  0.9 %  sodium chloride infusion (1,000 mLs Intravenous New Bag/Given 12/25/18 1405)  0.9 %  sodium chloride infusion (Manually program via Guardrails IV Fluids) (has no  administration in time range)     Initial Impression / Assessment and Plan / ED Course  I have reviewed the triage vital signs and the nursing notes.  Pertinent labs & imaging results that were available during my care of the patient were reviewed by me and considered in my medical decision making (see chart for details).  Clinical Course as of Dec 25 1634  Wed Dec 25, 2018  0917 Labs reviewed.  BUN and creatinine slightly increased from previous value.   [JK]  0917 Hemoglobin also declining since previous value.   [JK]  1023 D/w Dr Delle Reining.  The patient's labs from his office were worse than they are today here in the ED.  He does feel the patient would benefit from a unit of blood.  He also requests checking a chest x-ray and a urinalysis because her white blood cell count was elevated in his office.  Agrees  that patient can be managed as an outpatient   [JK]    Clinical Course User Index [JK] Dorie Rank, MD     Patient presented to the emergency room for evaluation after abnormal laboratory tests at her GI doctor's office.  Patient's laboratory tests are slightly improved today.  They are similar to her recent hospitalization values.  Patient's hemoglobin has been trending down however.  She does seem to be symptomatic with fatigue and lightheadedness.  I discussed the case with her GI doctor Dr. Delle Reining.  Patient was given a unit of blood here in the emergency room.  Plan on discharge with close outpatient follow-up.  Will turn over to Dr Ronnald Nian to dc as pending is waiting for blood transfusion.  Final Clinical Impressions(s) / ED Diagnoses   Final diagnoses:  None    ED Discharge Orders         [place]     12/25/18 1253    Face-to-face encounter (required for Medicare/Medicaid patients)    Comments:  I Dorie Rank certify that this patient is under my care and that I, or a nurse practitioner or physician's assistant working with me, had a face-to-face encounter that meets the physician face-to-face encounter requirements with this patient on 12/25/2018. The encounter with the patient was in whole, or in part for the following medical condition(s) which is the primary reason for home health care (List medical condition): anemia, colitis, hip pain, falls   12/25/18 1253           Dorie Rank, MD 12/26/18 1206    Dorie Rank, MD 01/06/19 2347

## 2018-12-25 NOTE — Discharge Planning (Signed)
   Home health agencies that serve [phone]. Your favorite home health agencies  [place] of Patient Care Rating Patient Survey Summary Rating  [facility]  [phone] 3 out of 5 stars 4 out of [place]  [phone] 3  out of 5 stars 3 out of [place]  [phone] 4 out of 5 stars 4 out of [place]  [phone] 4  out of 5 stars 4 out of 5 stars    [phone] 4 out of 5 stars 4 out of [place]  [phone] 4 out of 5 stars 4 out of [place]  [phone] 3 out of 5 stars 3 out of 5 stars  [facility]  [phone] 4 out of 5 stars Not Available11  [facility]  [phone] 3 out of 5 stars 2 out of [place]  [phone] 4 out of 5 stars 3 out of [place]  [phone] 3  out of 5 stars 5 out of [place]  [phone]

## 2018-12-25 NOTE — ED Notes (Signed)
Pt ambulated to bathroom without assistance 

## 2018-12-26 LAB — BPAM RBC
Blood Product Expiration Date: 202004012359
ISSUE DATE / TIME: 202003041521
Unit Type and Rh: 6200

## 2018-12-26 LAB — TYPE AND SCREEN
ABO/RH(D): AB POS
Antibody Screen: NEGATIVE
Unit division: 0

## 2018-12-27 ENCOUNTER — Telehealth: Payer: Self-pay | Admitting: *Deleted

## 2018-12-27 NOTE — Telephone Encounter (Signed)
EDCM received correspondence from [facility]iaison rep that pt declined home health services at this time.

## 2018-12-31 DIAGNOSIS — D500 Iron deficiency anemia secondary to blood loss (chronic): Secondary | ICD-10-CM | POA: Diagnosis not present

## 2018-12-31 DIAGNOSIS — M7061 Trochanteric bursitis, right hip: Secondary | ICD-10-CM | POA: Diagnosis not present

## 2018-12-31 DIAGNOSIS — K529 Noninfective gastroenteritis and colitis, unspecified: Secondary | ICD-10-CM | POA: Diagnosis not present

## 2018-12-31 DIAGNOSIS — I10 Essential (primary) hypertension: Secondary | ICD-10-CM | POA: Diagnosis not present

## 2019-01-01 ENCOUNTER — Telehealth: Payer: Self-pay | Admitting: *Deleted

## 2019-01-01 DIAGNOSIS — K529 Noninfective gastroenteritis and colitis, unspecified: Secondary | ICD-10-CM | POA: Diagnosis not present

## 2019-01-01 DIAGNOSIS — M7061 Trochanteric bursitis, right hip: Secondary | ICD-10-CM | POA: Diagnosis not present

## 2019-01-01 DIAGNOSIS — I10 Essential (primary) hypertension: Secondary | ICD-10-CM | POA: Diagnosis not present

## 2019-01-01 DIAGNOSIS — D500 Iron deficiency anemia secondary to blood loss (chronic): Secondary | ICD-10-CM | POA: Diagnosis not present

## 2019-01-01 NOTE — Telephone Encounter (Addendum)
"  [name] daughter [name] [phone]) calling to change my mother's appointment up from July to as soon as possible, she's losing blood.     Instructed to call hematologist to be seen as soon as possible with PCP visit on yesterday with Dr.David Bouska with [facility].  Hospitalized early February.  Flare up of colitis, three weeks of diarrhea.    Last Wednesday back to ED.  Received blood transfusion.  She was self sufficient.  Since January she's in bed except for doctor's appointments.  Family assisting."  Advised to report to ED if in distress of shortness of breath, weakness, dizziness or chest pain.  "She's not with me.  I'm at work."'

## 2019-01-01 NOTE — Telephone Encounter (Signed)
Called Dr Coletta Memos office with information below and that Julien Nordmann will see her in July .

## 2019-01-01 NOTE — Telephone Encounter (Signed)
Okay to see her for new evaluation of anemia in 2-3 weeks.  She is currently seen for a stage I lung cancer.

## 2019-01-02 DIAGNOSIS — M25551 Pain in right hip: Secondary | ICD-10-CM | POA: Diagnosis not present

## 2019-01-02 DIAGNOSIS — M25561 Pain in right knee: Secondary | ICD-10-CM | POA: Diagnosis not present

## 2019-01-02 NOTE — Telephone Encounter (Signed)
Forwarded message to [employer].

## 2019-01-13 ENCOUNTER — Telehealth: Payer: Self-pay | Admitting: Cardiology

## 2019-01-13 NOTE — Telephone Encounter (Signed)
° °  Pt c/o Shortness Of Breath: STAT if SOB developed within the last 24 hours or pt is noticeably SOB on the phone  1. Are you currently SOB (can you hear that pt is SOB on the phone)? yes  2. How long have you been experiencing SOB? Long time.   3. Are you SOB when sitting or when up moving around? both  4. Are you currently experiencing any other symptoms? Mild chest pain  PT is calling with concerns for the minor chest pain she is having. Pain seems to go away after taking Tums. May just be acid reflux, but wants to discuss symptoms with nurse

## 2019-01-13 NOTE — Telephone Encounter (Signed)
Pt called continues to c/o chest pain Pt can take tums and pain goes away and then comes back .Also pt  was SOB per last CBC HGB was 8.8 encouraged pt to call PMD may need another CBC .Pt had to cx Myoview as had a bout of colitis and Myoview has been rescheduled for mid April Will forward message to Dr Percival Spanish fore review and recommendations./cy

## 2019-01-14 DIAGNOSIS — R5383 Other fatigue: Secondary | ICD-10-CM | POA: Diagnosis not present

## 2019-01-14 DIAGNOSIS — I10 Essential (primary) hypertension: Secondary | ICD-10-CM | POA: Diagnosis not present

## 2019-01-14 DIAGNOSIS — R634 Abnormal weight loss: Secondary | ICD-10-CM | POA: Diagnosis not present

## 2019-01-14 DIAGNOSIS — D649 Anemia, unspecified: Secondary | ICD-10-CM | POA: Diagnosis not present

## 2019-01-14 DIAGNOSIS — R933 Abnormal findings on diagnostic imaging of other parts of digestive tract: Secondary | ICD-10-CM | POA: Diagnosis not present

## 2019-01-14 NOTE — Telephone Encounter (Signed)
Spoke with pt she stated she saw a GI doctor today, and was told he had indigestion, she was given some medications, pt stated she is feel better and do not need to be seen by heart doctor, pt voice understanding and thanks for the call back.

## 2019-01-14 NOTE — Telephone Encounter (Signed)
Please call this patient and set her up for a phone or video visit this week.

## 2019-01-24 DIAGNOSIS — B9681 Helicobacter pylori [H. pylori] as the cause of diseases classified elsewhere: Secondary | ICD-10-CM | POA: Diagnosis not present

## 2019-01-24 DIAGNOSIS — D649 Anemia, unspecified: Secondary | ICD-10-CM | POA: Diagnosis not present

## 2019-01-24 DIAGNOSIS — K2960 Other gastritis without bleeding: Secondary | ICD-10-CM | POA: Diagnosis not present

## 2019-01-24 DIAGNOSIS — D509 Iron deficiency anemia, unspecified: Secondary | ICD-10-CM | POA: Diagnosis not present

## 2019-01-24 DIAGNOSIS — K6389 Other specified diseases of intestine: Secondary | ICD-10-CM | POA: Diagnosis not present

## 2019-01-24 DIAGNOSIS — Z538 Procedure and treatment not carried out for other reasons: Secondary | ICD-10-CM | POA: Diagnosis not present

## 2019-01-24 DIAGNOSIS — K529 Noninfective gastroenteritis and colitis, unspecified: Secondary | ICD-10-CM | POA: Diagnosis not present

## 2019-01-24 DIAGNOSIS — K3189 Other diseases of stomach and duodenum: Secondary | ICD-10-CM | POA: Diagnosis not present

## 2019-01-24 DIAGNOSIS — R634 Abnormal weight loss: Secondary | ICD-10-CM | POA: Diagnosis not present

## 2019-01-27 ENCOUNTER — Telehealth: Payer: Self-pay | Admitting: Cardiology

## 2019-01-27 NOTE — Telephone Encounter (Signed)
Dr. Percival Spanish   Due to mandatory government regulations rearding NLGXQ-11, we are postponing all elective and non-urgent cardiac imaging procedures.   [name] is scheduled for a nuclear stress test for coronary artery calcifications on 4/14.  I have spoken with the patient and she was found to have a gastric ulcer on EGD and is having a colonoscopy tomorrow.  Otherwise she is doing fine.  I am going to reschedule her appt for stress test 4-6 weeks out which she was thankful for.   Robin Moffit and Dahlia Bailiff please cancel study and be placed on call back to reschedule study in 4-6 weeks

## 2019-01-28 DIAGNOSIS — D649 Anemia, unspecified: Secondary | ICD-10-CM | POA: Diagnosis not present

## 2019-01-28 DIAGNOSIS — R197 Diarrhea, unspecified: Secondary | ICD-10-CM | POA: Diagnosis not present

## 2019-01-28 DIAGNOSIS — C184 Malignant neoplasm of transverse colon: Secondary | ICD-10-CM | POA: Diagnosis not present

## 2019-01-28 DIAGNOSIS — K523 Indeterminate colitis: Secondary | ICD-10-CM | POA: Diagnosis not present

## 2019-01-28 DIAGNOSIS — K6389 Other specified diseases of intestine: Secondary | ICD-10-CM | POA: Diagnosis not present

## 2019-01-28 DIAGNOSIS — K5730 Diverticulosis of large intestine without perforation or abscess without bleeding: Secondary | ICD-10-CM | POA: Diagnosis not present

## 2019-01-28 DIAGNOSIS — D509 Iron deficiency anemia, unspecified: Secondary | ICD-10-CM | POA: Diagnosis not present

## 2019-01-28 DIAGNOSIS — R634 Abnormal weight loss: Secondary | ICD-10-CM | POA: Diagnosis not present

## 2019-02-04 ENCOUNTER — Encounter (HOSPITAL_COMMUNITY): Payer: Medicare Other

## 2019-02-04 DIAGNOSIS — F411 Generalized anxiety disorder: Secondary | ICD-10-CM | POA: Diagnosis not present

## 2019-02-04 DIAGNOSIS — E039 Hypothyroidism, unspecified: Secondary | ICD-10-CM | POA: Diagnosis not present

## 2019-02-04 DIAGNOSIS — I10 Essential (primary) hypertension: Secondary | ICD-10-CM | POA: Diagnosis not present

## 2019-02-04 DIAGNOSIS — C184 Malignant neoplasm of transverse colon: Secondary | ICD-10-CM | POA: Diagnosis not present

## 2019-02-04 DIAGNOSIS — Z0000 Encounter for general adult medical examination without abnormal findings: Secondary | ICD-10-CM | POA: Diagnosis not present

## 2019-02-04 DIAGNOSIS — G894 Chronic pain syndrome: Secondary | ICD-10-CM | POA: Diagnosis not present

## 2019-02-06 DIAGNOSIS — R197 Diarrhea, unspecified: Secondary | ICD-10-CM | POA: Diagnosis not present

## 2019-02-14 DIAGNOSIS — K56699 Other intestinal obstruction unspecified as to partial versus complete obstruction: Secondary | ICD-10-CM | POA: Diagnosis not present

## 2019-02-14 DIAGNOSIS — C184 Malignant neoplasm of transverse colon: Secondary | ICD-10-CM | POA: Diagnosis not present

## 2019-02-17 ENCOUNTER — Inpatient Hospital Stay (HOSPITAL_COMMUNITY)
Admission: AD | Admit: 2019-02-17 | Discharge: 2019-02-21 | DRG: 470 | Disposition: A | Discharge status: Home Health | Payer: Medicare Other | Source: Ambulatory Visit | Attending: Internal Medicine | Admitting: Internal Medicine

## 2019-02-17 ENCOUNTER — Inpatient Hospital Stay (HOSPITAL_COMMUNITY): Payer: Medicare Other

## 2019-02-17 ENCOUNTER — Other Ambulatory Visit: Payer: Self-pay | Admitting: Orthopedic Surgery

## 2019-02-17 DIAGNOSIS — M797 Fibromyalgia: Secondary | ICD-10-CM | POA: Diagnosis present

## 2019-02-17 DIAGNOSIS — K56699 Other intestinal obstruction unspecified as to partial versus complete obstruction: Secondary | ICD-10-CM | POA: Diagnosis not present

## 2019-02-17 DIAGNOSIS — W19XXXA Unspecified fall, initial encounter: Secondary | ICD-10-CM

## 2019-02-17 DIAGNOSIS — I130 Hypertensive heart and chronic kidney disease with heart failure and stage 1 through stage 4 chronic kidney disease, or unspecified chronic kidney disease: Secondary | ICD-10-CM | POA: Diagnosis not present

## 2019-02-17 DIAGNOSIS — F329 Major depressive disorder, single episode, unspecified: Secondary | ICD-10-CM | POA: Diagnosis not present

## 2019-02-17 DIAGNOSIS — S72051A Unspecified fracture of head of right femur, initial encounter for closed fracture: Secondary | ICD-10-CM | POA: Diagnosis not present

## 2019-02-17 DIAGNOSIS — Z96612 Presence of left artificial shoulder joint: Secondary | ICD-10-CM | POA: Diagnosis present

## 2019-02-17 DIAGNOSIS — F411 Generalized anxiety disorder: Secondary | ICD-10-CM | POA: Diagnosis not present

## 2019-02-17 DIAGNOSIS — D869 Sarcoidosis, unspecified: Secondary | ICD-10-CM | POA: Diagnosis present

## 2019-02-17 DIAGNOSIS — M1990 Unspecified osteoarthritis, unspecified site: Secondary | ICD-10-CM | POA: Diagnosis present

## 2019-02-17 DIAGNOSIS — N289 Disorder of kidney and ureter, unspecified: Secondary | ICD-10-CM | POA: Diagnosis not present

## 2019-02-17 DIAGNOSIS — M1611 Unilateral primary osteoarthritis, right hip: Secondary | ICD-10-CM | POA: Diagnosis present

## 2019-02-17 DIAGNOSIS — S72001A Fracture of unspecified part of neck of right femur, initial encounter for closed fracture: Secondary | ICD-10-CM | POA: Diagnosis not present

## 2019-02-17 DIAGNOSIS — C649 Malignant neoplasm of unspecified kidney, except renal pelvis: Secondary | ICD-10-CM | POA: Diagnosis not present

## 2019-02-17 DIAGNOSIS — N183 Chronic kidney disease, stage 3 (moderate): Secondary | ICD-10-CM | POA: Diagnosis not present

## 2019-02-17 DIAGNOSIS — M25551 Pain in right hip: Secondary | ICD-10-CM | POA: Diagnosis not present

## 2019-02-17 DIAGNOSIS — K529 Noninfective gastroenteritis and colitis, unspecified: Secondary | ICD-10-CM | POA: Diagnosis present

## 2019-02-17 DIAGNOSIS — Z87891 Personal history of nicotine dependence: Secondary | ICD-10-CM

## 2019-02-17 DIAGNOSIS — K219 Gastro-esophageal reflux disease without esophagitis: Secondary | ICD-10-CM | POA: Diagnosis present

## 2019-02-17 DIAGNOSIS — Z880 Allergy status to penicillin: Secondary | ICD-10-CM

## 2019-02-17 DIAGNOSIS — M62838 Other muscle spasm: Secondary | ICD-10-CM | POA: Diagnosis present

## 2019-02-17 DIAGNOSIS — Z888 Allergy status to other drugs, medicaments and biological substances status: Secondary | ICD-10-CM

## 2019-02-17 DIAGNOSIS — J439 Emphysema, unspecified: Secondary | ICD-10-CM | POA: Diagnosis not present

## 2019-02-17 DIAGNOSIS — Z01818 Encounter for other preprocedural examination: Secondary | ICD-10-CM | POA: Diagnosis not present

## 2019-02-17 DIAGNOSIS — M898X8 Other specified disorders of bone, other site: Secondary | ICD-10-CM | POA: Diagnosis not present

## 2019-02-17 DIAGNOSIS — D72829 Elevated white blood cell count, unspecified: Secondary | ICD-10-CM

## 2019-02-17 DIAGNOSIS — Z905 Acquired absence of kidney: Secondary | ICD-10-CM

## 2019-02-17 DIAGNOSIS — Z471 Aftercare following joint replacement surgery: Secondary | ICD-10-CM | POA: Diagnosis not present

## 2019-02-17 DIAGNOSIS — E039 Hypothyroidism, unspecified: Secondary | ICD-10-CM | POA: Diagnosis not present

## 2019-02-17 DIAGNOSIS — D62 Acute posthemorrhagic anemia: Secondary | ICD-10-CM | POA: Diagnosis not present

## 2019-02-17 DIAGNOSIS — Z90721 Acquired absence of ovaries, unilateral: Secondary | ICD-10-CM

## 2019-02-17 DIAGNOSIS — Z981 Arthrodesis status: Secondary | ICD-10-CM

## 2019-02-17 DIAGNOSIS — Z85528 Personal history of other malignant neoplasm of kidney: Secondary | ICD-10-CM

## 2019-02-17 DIAGNOSIS — M25851 Other specified joint disorders, right hip: Secondary | ICD-10-CM | POA: Diagnosis not present

## 2019-02-17 DIAGNOSIS — Z801 Family history of malignant neoplasm of trachea, bronchus and lung: Secondary | ICD-10-CM

## 2019-02-17 DIAGNOSIS — G894 Chronic pain syndrome: Secondary | ICD-10-CM | POA: Diagnosis present

## 2019-02-17 DIAGNOSIS — Z90710 Acquired absence of both cervix and uterus: Secondary | ICD-10-CM | POA: Diagnosis not present

## 2019-02-17 DIAGNOSIS — Z85118 Personal history of other malignant neoplasm of bronchus and lung: Secondary | ICD-10-CM

## 2019-02-17 DIAGNOSIS — G5603 Carpal tunnel syndrome, bilateral upper limbs: Secondary | ICD-10-CM | POA: Diagnosis not present

## 2019-02-17 DIAGNOSIS — R634 Abnormal weight loss: Secondary | ICD-10-CM | POA: Diagnosis not present

## 2019-02-17 DIAGNOSIS — I509 Heart failure, unspecified: Secondary | ICD-10-CM | POA: Diagnosis present

## 2019-02-17 DIAGNOSIS — M545 Low back pain: Secondary | ICD-10-CM | POA: Diagnosis present

## 2019-02-17 DIAGNOSIS — Z96653 Presence of artificial knee joint, bilateral: Secondary | ICD-10-CM | POA: Diagnosis present

## 2019-02-17 DIAGNOSIS — Z9889 Other specified postprocedural states: Secondary | ICD-10-CM

## 2019-02-17 DIAGNOSIS — Z8781 Personal history of (healed) traumatic fracture: Secondary | ICD-10-CM

## 2019-02-17 DIAGNOSIS — Z85820 Personal history of malignant melanoma of skin: Secondary | ICD-10-CM

## 2019-02-17 DIAGNOSIS — Z79890 Hormone replacement therapy (postmenopausal): Secondary | ICD-10-CM

## 2019-02-17 DIAGNOSIS — W19XXXD Unspecified fall, subsequent encounter: Secondary | ICD-10-CM | POA: Diagnosis not present

## 2019-02-17 DIAGNOSIS — Z818 Family history of other mental and behavioral disorders: Secondary | ICD-10-CM

## 2019-02-17 DIAGNOSIS — Z8543 Personal history of malignant neoplasm of ovary: Secondary | ICD-10-CM

## 2019-02-17 DIAGNOSIS — D649 Anemia, unspecified: Secondary | ICD-10-CM | POA: Diagnosis not present

## 2019-02-17 DIAGNOSIS — Z79899 Other long term (current) drug therapy: Secondary | ICD-10-CM

## 2019-02-17 DIAGNOSIS — Z96641 Presence of right artificial hip joint: Secondary | ICD-10-CM | POA: Diagnosis not present

## 2019-02-17 DIAGNOSIS — I10 Essential (primary) hypertension: Secondary | ICD-10-CM | POA: Diagnosis not present

## 2019-02-17 DIAGNOSIS — I110 Hypertensive heart disease with heart failure: Secondary | ICD-10-CM | POA: Diagnosis not present

## 2019-02-17 HISTORY — DX: Fracture of unspecified part of neck of right femur, initial encounter for closed fracture: S72.001A

## 2019-02-17 LAB — CBC WITH DIFFERENTIAL/PLATELET
Abs Immature Granulocytes: 0.05 10*3/uL (ref 0.00–0.07)
Basophils Absolute: 0 10*3/uL (ref 0.0–0.1)
Basophils Relative: 0 %
Eosinophils Absolute: 0.1 10*3/uL (ref 0.0–0.5)
Eosinophils Relative: 1 %
HCT: 22.7 % — ABNORMAL LOW (ref 36.0–46.0)
Hemoglobin: 7.1 g/dL — ABNORMAL LOW (ref 12.0–15.0)
Immature Granulocytes: 0 %
Lymphocytes Relative: 14 %
Lymphs Abs: 1.6 10*3/uL (ref 0.7–4.0)
MCH: 29.7 pg (ref 26.0–34.0)
MCHC: 31.3 g/dL (ref 30.0–36.0)
MCV: 95 fL (ref 80.0–100.0)
Monocytes Absolute: 1.3 10*3/uL — ABNORMAL HIGH (ref 0.1–1.0)
Monocytes Relative: 11 %
Neutro Abs: 8.7 10*3/uL — ABNORMAL HIGH (ref 1.7–7.7)
Neutrophils Relative %: 74 %
Platelets: 225 10*3/uL (ref 150–400)
RBC: 2.39 MIL/uL — ABNORMAL LOW (ref 3.87–5.11)
RDW: 14.5 % (ref 11.5–15.5)
WBC: 11.8 10*3/uL — ABNORMAL HIGH (ref 4.0–10.5)
nRBC: 0 % (ref 0.0–0.2)

## 2019-02-17 LAB — COMPREHENSIVE METABOLIC PANEL
ALT: 19 U/L (ref 0–44)
AST: 16 U/L (ref 15–41)
Albumin: 2.5 g/dL — ABNORMAL LOW (ref 3.5–5.0)
Alkaline Phosphatase: 58 U/L (ref 38–126)
Anion gap: 10 (ref 5–15)
BUN: 38 mg/dL — ABNORMAL HIGH (ref 8–23)
CO2: 26 mmol/L (ref 22–32)
Calcium: 8.8 mg/dL — ABNORMAL LOW (ref 8.9–10.3)
Chloride: 105 mmol/L (ref 98–111)
Creatinine, Ser: 1.32 mg/dL — ABNORMAL HIGH (ref 0.44–1.00)
GFR calc Af Amer: 48 mL/min — ABNORMAL LOW (ref >60)
GFR calc non Af Amer: 41 mL/min — ABNORMAL LOW (ref >60)
Glucose, Bld: 126 mg/dL — ABNORMAL HIGH (ref 70–99)
Potassium: 3.9 mmol/L (ref 3.5–5.1)
Sodium: 141 mmol/L (ref 135–145)
Total Bilirubin: 0.7 mg/dL (ref 0.3–1.2)
Total Protein: 5.7 g/dL — ABNORMAL LOW (ref 6.5–8.1)

## 2019-02-17 LAB — PREPARE RBC (CROSSMATCH)

## 2019-02-17 LAB — MRSA PCR SCREENING: MRSA by PCR: NEGATIVE

## 2019-02-17 MED ORDER — DIPHENOXYLATE-ATROPINE 2.5-0.025 MG PO TABS: 2.0000 | ORAL_TABLET | Freq: Four times a day (QID) | ORAL | Status: DC | PRN

## 2019-02-17 MED ORDER — LISINOPRIL 10 MG PO TABS
10.0000 mg | ORAL_TABLET | Freq: Every day | ORAL | Status: DC
  Administered 2019-02-19 – 2019-02-21 (×3): 10 mg via ORAL
  Filled 2019-02-17 (×4): qty 1

## 2019-02-17 MED ORDER — CEFAZOLIN SODIUM-DEXTROSE 2-4 GM/100ML-% IV SOLN: 2.0000 g | INTRAVENOUS | Status: DC

## 2019-02-17 MED ORDER — SODIUM CHLORIDE 0.9 % IV SOLN: INTRAVENOUS | Status: DC

## 2019-02-17 MED ORDER — SODIUM CHLORIDE 0.9% IV SOLUTION
Freq: Once | INTRAVENOUS | Status: AC
  Administered 2019-02-17: 18:00:00 via INTRAVENOUS

## 2019-02-17 MED ORDER — ZOLPIDEM TARTRATE 5 MG PO TABS
10.0000 mg | ORAL_TABLET | Freq: Every day | ORAL | Status: DC
  Administered 2019-02-17 – 2019-02-20 (×3): 10 mg via ORAL
  Filled 2019-02-17 (×3): qty 2

## 2019-02-17 MED ORDER — LEVOTHYROXINE SODIUM 100 MCG PO TABS
100.0000 ug | ORAL_TABLET | Freq: Every day | ORAL | Status: DC
  Administered 2019-02-18 – 2019-02-21 (×4): 100 ug via ORAL
  Filled 2019-02-17 (×4): qty 1

## 2019-02-17 MED ORDER — METHOCARBAMOL 500 MG PO TABS
500.0000 mg | ORAL_TABLET | Freq: Four times a day (QID) | ORAL | Status: DC | PRN
  Administered 2019-02-18 – 2019-02-21 (×3): 500 mg via ORAL
  Filled 2019-02-17 (×2): qty 1

## 2019-02-17 MED ORDER — HEPARIN SODIUM (PORCINE) 5000 UNIT/ML IJ SOLN
5000.0000 [IU] | Freq: Three times a day (TID) | INTRAMUSCULAR | Status: DC
  Administered 2019-02-17: 5000 [IU] via SUBCUTANEOUS
  Filled 2019-02-17: qty 1

## 2019-02-17 MED ORDER — HYDROCODONE-ACETAMINOPHEN 5-325 MG PO TABS
1.0000 | ORAL_TABLET | Freq: Four times a day (QID) | ORAL | Status: DC | PRN
  Administered 2019-02-17: 15:00:00 2 via ORAL
  Filled 2019-02-17: qty 2

## 2019-02-17 MED ORDER — DICLOFENAC SODIUM 1 % TD GEL
2.0000 g | Freq: Two times a day (BID) | TRANSDERMAL | Status: DC | PRN
  Filled 2019-02-17: qty 100

## 2019-02-17 MED ORDER — SENNOSIDES-DOCUSATE SODIUM 8.6-50 MG PO TABS: 1.0000 | ORAL_TABLET | Freq: Every evening | ORAL | Status: DC | PRN

## 2019-02-17 MED ORDER — HYDROCODONE-ACETAMINOPHEN 5-325 MG PO TABS
1.0000 | ORAL_TABLET | Freq: Four times a day (QID) | ORAL | Status: DC | PRN
  Administered 2019-02-17 – 2019-02-18 (×2): 1 via ORAL
  Filled 2019-02-17 (×3): qty 1

## 2019-02-17 MED ORDER — BUPROPION HCL ER (SR) 150 MG PO TB12
150.0000 mg | ORAL_TABLET | Freq: Every day | ORAL | Status: DC
  Administered 2019-02-17 – 2019-02-21 (×4): 150 mg via ORAL
  Filled 2019-02-17 (×5): qty 1

## 2019-02-17 MED ORDER — FERROUS SULFATE 325 (65 FE) MG PO TABS
325.0000 mg | ORAL_TABLET | Freq: Every day | ORAL | Status: DC
  Administered 2019-02-17: 325 mg via ORAL
  Filled 2019-02-17: qty 1

## 2019-02-17 MED ORDER — AMLODIPINE BESYLATE 10 MG PO TABS
10.0000 mg | ORAL_TABLET | Freq: Every day | ORAL | Status: DC
  Administered 2019-02-19 – 2019-02-21 (×3): 10 mg via ORAL
  Filled 2019-02-17 (×3): qty 1

## 2019-02-17 MED ORDER — MORPHINE SULFATE ER 15 MG PO TBCR
30.0000 mg | EXTENDED_RELEASE_TABLET | Freq: Two times a day (BID) | ORAL | Status: DC
  Administered 2019-02-17 – 2019-02-21 (×7): 30 mg via ORAL
  Filled 2019-02-17 (×7): qty 2

## 2019-02-17 MED ORDER — CYCLOBENZAPRINE HCL 5 MG PO TABS
5.0000 mg | ORAL_TABLET | Freq: Three times a day (TID) | ORAL | Status: DC | PRN
  Administered 2019-02-17 – 2019-02-21 (×4): 5 mg via ORAL
  Filled 2019-02-17 (×4): qty 1

## 2019-02-17 MED ORDER — CHLORHEXIDINE GLUCONATE 4 % EX LIQD
60.0000 mL | Freq: Once | CUTANEOUS | Status: AC
  Administered 2019-02-18: 4 via TOPICAL
  Filled 2019-02-17: qty 60

## 2019-02-17 MED ORDER — BUSPIRONE HCL 5 MG PO TABS
15.0000 mg | ORAL_TABLET | Freq: Two times a day (BID) | ORAL | Status: DC
  Administered 2019-02-17 – 2019-02-21 (×7): 15 mg via ORAL
  Filled 2019-02-17 (×8): qty 3

## 2019-02-17 MED ORDER — POVIDONE-IODINE 10 % EX SWAB: 2.0000 "application " | Freq: Once | CUTANEOUS | Status: DC

## 2019-02-17 MED ORDER — MORPHINE SULFATE (PF) 2 MG/ML IV SOLN
0.5000 mg | INTRAVENOUS | Status: DC | PRN
  Administered 2019-02-17 – 2019-02-18 (×7): 0.5 mg via INTRAVENOUS
  Filled 2019-02-17 (×8): qty 1

## 2019-02-17 MED ORDER — PANTOPRAZOLE SODIUM 40 MG PO TBEC
40.0000 mg | DELAYED_RELEASE_TABLET | Freq: Every day | ORAL | Status: DC
  Administered 2019-02-19 – 2019-02-21 (×3): 40 mg via ORAL
  Filled 2019-02-17 (×4): qty 1

## 2019-02-17 MED ORDER — SODIUM CHLORIDE 0.9 % IV SOLN
INTRAVENOUS | Status: DC
  Administered 2019-02-17 – 2019-02-18 (×2): via INTRAVENOUS

## 2019-02-17 NOTE — Progress Notes (Signed)
[facility]urni R Segundo [id number]  Code Status: FULL  Admission Data: 02/17/2019 7:50 PM  Attending Provider: Tamala Julian  AVW:PVXYIA, Shanon Brow, MD  Consults/ Treatment Team:   Bobbe R Hagerty is a 68 y.o. female patient admitted from ED awake, alert - oriented X 4 - no acute distress noted. VSS - Blood pressure (!) 111/46, pulse 79, temperature 98.3 F (36.8 C), temperature source Oral, resp. rate 18, height 5\' 5"  (1.651 m), weight 63.6 kg, SpO2 100 %. no c/o shortness of breath, no c/o chest pain. Orientation to room, and floor completed with information packet given to patient/family. Admission INP armband ID verified with patient/family, and in place.  Fall assessment complete, with patient and family able to verbalize understanding of risk associated with falls, and verbalized understanding to call nsg before up out of bed. Call light within reach, patient able to voice, and demonstrate understanding. Skin, clean-dry- intact without evidence of bruising, or skin tears.  No evidence of skin break down noted on exam. Cracking noted to heels. ?  Will cont to eval and treat per MD orders.  Melonie Florida, RN  02/17/2019 7:50 PM

## 2019-02-17 NOTE — H&P (Signed)
History and Physical    [name] DQQ:[id number] DOB: [birth year] DOA: (Not on file)  Referring MD/NP/PA: Dr. Mardelle Matte PCP: Bernerd Limbo, MD  Patient coming from: [facility]  Chief Complaint: Right hip pain  I have personally briefly reviewed patient's old medical records in [facility]   HPI: [name] is a 68 y.o. female with medical history significant of lung cancer, kidney cancer status post partial nephrectomy CHF, CKD stage III, chronic pain, fibromyalgia, history of ovarian cancer, and hypothyroidism, who presented with complaints of right hip pain l found to have a displaced right femoral neck fracture in Dr. Mardelle Matte office.  Last reports following on the affected side sometime in February, but had imaging studies done that did not note any signs of a fracture.  Last night while standing in the kitchen she reports turning and feeling like her right hip gave way.  Thereafter, she was unable to bear any significant amount of weight on the affected side.  Complains of significant pain and muscle spasms in the left side.  Other associated symptoms include fatigue and weight loss.  She was evaluated by Dr. Mardelle Matte for symptoms and had x-ray imaging revealing a acute displaced right femoral neck fracture.  Dr. Mardelle Matte call and requested direct admission.  She has been being evaluated for colitis by gastroenterology.  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  At this time she reports being evaluated for need of resection of that part of her colon.  Reports that diarrhea symptoms resolved approximately 3 days ago and she is not noted any further bleeding.  Denies complaints of shortness of breath, chest pain, cough, nausea, vomiting, or dysuria. Furthermore, patient was scheduled to have nuclear stress testing on 4/14, but  it was postponed due to COVID-19 for further evaluation of intermittent complaints of shortness of breath.   ED Course: Direct admission with most recent lab work from 12/25/2018.  Labs at that time revealed WBC 12.8, hemoglobin 8.8, platelets 444, BUN 31, and creatinine 1.48.  Review of Systems  Constitutional: Positive for malaise/fatigue and weight loss. Negative for chills and fever.  HENT: Negative for ear discharge and nosebleeds.   Eyes: Negative for photophobia and pain.  Respiratory: Negative for cough and wheezing.   Cardiovascular: Negative for chest pain and leg swelling.  Gastrointestinal: Negative for abdominal pain, blood in stool, diarrhea, nausea and vomiting.  Genitourinary: Negative for dysuria and hematuria.  Musculoskeletal: Positive for joint pain and myalgias. Negative for falls.  Neurological: Negative for speech change and loss of consciousness.  Psychiatric/Behavioral: Negative for substance abuse and suicidal ideas.    Past Medical History:  Diagnosis Date   Anemia    Anxiety    Arthritis    "everywhere" (08/19/2013)   Bilateral carpal tunnel syndrome    Chest pain at rest, rule out pericarditis 11/27/2012   CHF (congestive heart failure) ([place])    Chronic kidney disease    dr Risa Grill   Chronic lower back pain    Complication of anesthesia    "woke up twice during knee replacement" (08/19/2013)   Depression    Dyspnea    w/ exertion     Fibromyalgia    Headache(784.0)    hx of migraines    History of pericarditis, with pericardial window in 2009 11/27/2012   Hx of ovarian cancer  11/27/2012   Hypothyroidism 11/27/2012   Iron deficiency anemia    Kidney carcinoma ([place])    "both kidneys; ~ 3 yr apart" (08/19/2013)   Lung cancer ([place]) 2018   Melanoma of back ([place])    Migraines    Pneumonia    "several times; including today", RUL/notes 08/19/2013   Squamous carcinoma    "face, side of my nose, chest; used acid to get rid of them"  (08/19/2013)   Venous insufficiency 11/27/2012    Past Surgical History:  Procedure Laterality Date   ABDOMINAL HYSTERECTOMY  1979   ANTERIOR LAT LUMBAR FUSION Left 07/16/2018   Procedure: Left Lumbar two-three  Anterolateral decompression/interbody fusion with lateral plate fixation;  Surgeon: Kristeen Miss, MD;  Location: [facility];  Service: Neurosurgery;  Laterality: Left;   APPENDECTOMY     DOPPLER ECHOCARDIOGRAPHY  02/23/2011   LVEF>50%   FRACTURE SURGERY     KNEE ARTHROSCOPY Right    "twice before the replacement" (08/19/2013)   LEFT OOPHORECTOMY Left [place]   "ruptured disc"   MELANOMA EXCISION  ~ 2010   "my lower back" (08/19/2013)   ORIF HIP FRACTURE Left    x 2 age 19 and 4   PARTIAL NEPHRECTOMY Right 2005; ~ 2008   PERICARDIAL WINDOW  ~ 2012   [place]  2002?; 03/2012   REDUCTION MAMMAPLASTY Bilateral ~ 2008   RIGHT OOPHORECTOMY  ~ 2007   "it had cancer in it" (08/19/2013)   SHOULDER ARTHROSCOPY W/ ROTATOR CUFF REPAIR Right 1990's   TONSILLECTOMY  1954   TOTAL KNEE ARTHROPLASTY Right 1990's   TOTAL KNEE ARTHROPLASTY Left 08/02/2015   Procedure: LEFT TOTAL KNEE ARTHROPLASTY;  Surgeon: Paralee Cancel, MD;  Location: [facility];  Service: Orthopedics;  Laterality: Left;   TOTAL SHOULDER REPLACEMENT Left 2006?   TUBAL LIGATION  1952   VIDEO ASSISTED THORACOSCOPY (VATS)/WEDGE RESECTION Right 09/28/2017   Procedure: RIGHT VIDEO ASSISTED THORACOSCOPY (VATS)/WEDGE RESECTION, LYMPH NODE DISSECTION ;  Surgeon: Melrose Nakayama, MD;  Location: [facility];  Service: Thoracic;  Laterality: Right;     reports that she quit smoking about 33 years ago. Her smoking use included cigarettes. She has a 27.00 pack-year smoking history. She has never used smokeless tobacco. She reports that she does not drink alcohol or use drugs.  Allergies  Allergen Reactions   Amoxicillin Other (See Comments)    UNSPECIFIED REACTION  Has patient had  a PCN reaction causing immediate rash, facial/tongue/throat swelling, SOB or lightheadedness with hypotension: No Has patient had a PCN reaction causing severe rash involving mucus membranes or skin necrosis: No Has patient had a PCN reaction that required hospitalization No Has patient had a PCN reaction occurring within the last 10 years: No If all of the above answers are "NO", then may proceed with Cephalosporin use.   Ampicillin Rash and Other (See Comments)    Has patient had a PCN reaction causing immediate rash, facial/tongue/throat swelling, SOB or lightheadedness with hypotension: No Has patient had a PCN reaction causing severe rash involving mucus membranes or skin necrosis: No Has patient had a PCN reaction that required hospitalization No Has patient had a PCN reaction occurring within the last 10 years: No If all of the above answers are "NO", then may proceed with Cephalosporin use.   Buspar [Buspirone] Nausea And Vomiting    Patient can tolerate    Family History  Problem Relation Age of Onset   Cancer Mother 35  Stomach   Cancer Father 28       Lung cancer   Bipolar disorder Sister     Prior to Admission medications   Medication Sig Start Date End Date Taking? Authorizing Provider  acetaminophen (TYLENOL) 325 MG tablet Take 650 mg by mouth daily as needed (pain).     [provider]  buPROPion (WELLBUTRIN SR) 150 MG 12 hr tablet TAKE 1 TABLET BY MOUTH DAILY 12/16/18   Delia Chimes A, MD  busPIRone (BUSPAR) 15 MG tablet Take 15 mg by mouth 2 (two) times daily. 12/17/18   [provider]  cyclobenzaprine (FLEXERIL) 5 MG tablet Take 1 tablet (5 mg total) by mouth 3 (three) times daily as needed. For spasms Patient not taking: Reported on 12/25/2018 08/03/15   Danae Orleans, PA-C  diclofenac sodium (VOLTAREN) 1 % GEL Apply 2 g topically 2 (two) times daily as needed (pain).  10/25/16   [provider]  diphenoxylate-atropine (LOMOTIL)  2.5-0.025 MG tablet Take 2 tablets by mouth 4 (four) times daily as needed for diarrhea or loose stools.  12/19/18   [provider]  levothyroxine (SYNTHROID, LEVOTHROID) 100 MCG tablet TAKE 1 TABLET BY MOUTH BEFORE BREAKFAST MONDAY-FRIDAY SKIP SATURDAY ANS SUNDAY Patient taking differently: Take 100 mcg by mouth daily.  04/26/18   Forrest Moron, MD  methocarbamol (ROBAXIN) 500 MG tablet Take 1 tablet (500 mg total) by mouth every 6 (six) hours as needed for muscle spasms. 07/18/18   Kristeen Miss, MD  morphine (MS CONTIN) 30 MG 12 hr tablet Take 1 tablet (30 mg total) by mouth every 12 (twelve) hours. 07/18/18   Kristeen Miss, MD  ondansetron (ZOFRAN) 4 MG tablet Take 4 mg by mouth 3 (three) times daily as needed for nausea/vomiting. 12/03/18   [provider]  polyethylene glycol (MIRALAX / GLYCOLAX) packet Take 17 g by mouth 2 (two) times daily. Patient taking differently: Take 17 g by mouth daily as needed for moderate constipation.  08/03/15   Danae Orleans, PA-C  traMADol (ULTRAM) 50 MG tablet Take 1-2 tablets (50-100 mg total) by mouth every 6 (six) hours as needed (mild pain). Patient not taking: Reported on 12/25/2018 10/02/17   Jadene Pierini E, PA-C  vitamin B-12 (CYANOCOBALAMIN) 1000 MCG tablet Take 1 tablet by mouth daily.    [provider]  zolpidem (AMBIEN) 10 MG tablet Take 10 mg by mouth at bedtime. 12/02/18   [provider]    Physical Exam:  Constitutional: Elderly female who appears to be no acute distress Vitals:   02/17/19 1355  BP: (!) 108/33  Pulse: 88  Resp: 18  Temp: 98.1 F (36.7 C)  TempSrc: Oral  SpO2: 100%  Weight: 63.6 kg  Height: 5\' 5"  (1.651 m)   Eyes: PERRL, lids and conjunctivae normal ENMT: Mucous membranes are moist. Posterior pharynx clear of any exudate or lesions.  Neck: normal, supple, no masses, no thyromegaly Respiratory: clear to auscultation bilaterally, no wheezing, no crackles. Normal respiratory effort. No  accessory muscle use.  Cardiovascular: Regular rate and rhythm, no murmurs / rubs / gallops. No extremity edema. 2+ pedal pulses. No carotid bruits.  Abdomen: no tenderness, no masses palpated. No hepatosplenomegaly. Bowel sounds positive.  Musculoskeletal: no clubbing / cyanosis.  Right leg externally rotated and shortened Skin: no rashes, lesions, ulcers. No induration Neurologic: CN 2-12 grossly intact. Sensation intact, DTR normal. Strength 5/5 in all 4.  Psychiatric: Normal judgment and insight. Alert and oriented x 3. Normal mood.  Labs on Admission: I have personally reviewed following labs and imaging studies  CBC: No results for input(s): WBC, NEUTROABS, HGB, HCT, MCV, PLT in the last 168 hours. Basic Metabolic Panel: No results for input(s): NA, K, CL, CO2, GLUCOSE, BUN, CREATININE, CALCIUM, MG, PHOS in the last 168 hours. GFR: CrCl cannot be calculated (Patient's most recent lab result is older than the maximum 21 days allowed.). Liver Function Tests: No results for input(s): AST, ALT, ALKPHOS, BILITOT, PROT, ALBUMIN in the last 168 hours. No results for input(s): LIPASE, AMYLASE in the last 168 hours. No results for input(s): AMMONIA in the last 168 hours. Coagulation Profile: No results for input(s): INR, PROTIME in the last 168 hours. Cardiac Enzymes: No results for input(s): CKTOTAL, CKMB, CKMBINDEX, TROPONINI in the last 168 hours. BNP (last 3 results) No results for input(s): PROBNP in the last 8760 hours. HbA1C: No results for input(s): HGBA1C in the last 72 hours. CBG: No results for input(s): GLUCAP in the last 168 hours. Lipid Profile: No results for input(s): CHOL, HDL, LDLCALC, TRIG, CHOLHDL, LDLDIRECT in the last 72 hours. Thyroid Function Tests: No results for input(s): TSH, T4TOTAL, FREET4, T3FREE, THYROIDAB in the last 72 hours. Anemia Panel: No results for input(s): VITAMINB12, FOLATE, FERRITIN, TIBC, IRON, RETICCTPCT in the last 72 hours. Urine  analysis:    Component Value Date/Time   COLORURINE YELLOW 12/25/2018 1348   APPEARANCEUR CLEAR 12/25/2018 1348   LABSPEC 1.012 12/25/2018 1348   PHURINE 5.0 12/25/2018 1348   GLUCOSEU NEGATIVE 12/25/2018 1348   HGBUR NEGATIVE 12/25/2018 [place] 12/25/2018 1348   KETONESUR NEGATIVE 12/25/2018 1348   PROTEINUR NEGATIVE 12/25/2018 1348   UROBILINOGEN 1.0 07/29/2015 0939   NITRITE NEGATIVE 12/25/2018 1348   LEUKOCYTESUR NEGATIVE 12/25/2018 1348   Sepsis Labs: No results found for this or any previous visit (from the past 240 hour(s)).   Radiological Exams on Admission: No results found.  EKG: Independently reviewed.  Normal sinus rhythm at 86 bpm.  Assessment/Plan Displaced right femoral neck fracture: Acute.  Patient presents from Dr. Mardelle Matte office after being found to have an acute displaced femoral neck fracture by end.  Patient reports that she had just turned and felt hip give way.  Previously had imaging performed in February after having a fall and was not noted to have a fracture at that time. Most recently had a CT scan noting severe osteoarthritis of the right hip on 4/21. -Hip fracture order set initiated -Check CBC and CMP -N.p.o. after midnight -Pain control -Appreciate Dr. Mardelle Matte of orthopedics consultative services, will follow-up for further recommendation   Normocytic normochromic anemia: Patient previously noted to have colitis and bleeding.  Last required blood transfusion last month.  Denies having any abdominal pain, diarrhea, or bleeding at this time.  Hemoglobin on admission however 7.1.  Suspect symptoms could be related with ulcerative stricture found in the transverse colon earlier this month -Type and screen for possible need of blood product -Transfuse 1 unit of packed red blood cells prior to procedure -Check stool guaiac -Continue to monitor blood counts -Continue ferrous sulfate -May warrant formal consultation to GI, if patient  found to be actively bleeding  Leukocytosis: acute. WBC elevated 11.8.  Patient otherwise noted to be afebrile.  Elevation in white blood cell count could have been related to acute fracture, but appears to have been elevated since February this year. -Recheck CBC in a.m.  Suspected acute kidney injury due to dehydration: Patient presents with creatinine 1.32  and BUN 38.  The elevated BUN to creatinine ratio concerning for prerenal cause of symptoms. -Normal saline IV fluids at 75 mL/h overnight -Recheck kidney function in a.m.  Chronic pain syndrome: Patient with history of chronic back pain.  Home medications include MS Contin 30 mg twice daily, Flexeril, Robaxin, and tramadol as needed for pain -Continue MS Contin -Held tramadol and substituted hydrocodone   Essential hypertension -Continue lisinopril and amlodipine  Hypothyroidism: Last TSH noted to be 1.92 on 12/05/2018. -Continue levothyroxine  Anxiety/depression -Continue BuSpar and Wellbutrin   History of cancer: Past medical history includes ovarian, renal, lung cancer, and skin.  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  Treated with a course of steroids.  Currently being evaluated by Dr. Hilliard Clark for resection of that part of colon.  Pathology not readily visible on previous biopsy. -Continue outpatient follow-up  History of sarcoidosis: Stable.  Weight loss: Likely related with stricture and reports of diarrhea.  GERD -Continue Protonix DVT prophylaxis: SCDs Code Status: Full  Family Communication: No family present bedside Disposition Plan: Will need rehab following discharge most likely Consults called: Orthopedics Dr. Mardelle Matte Admission status: Inpatient  Norval Morton MD [facility] Pager [phone]   If 7PM-7AM, please contact night-coverage [email or web address] Password [email or web address]  02/17/2019, 1:11 PM

## 2019-02-18 ENCOUNTER — Encounter (HOSPITAL_COMMUNITY): Payer: Self-pay | Admitting: General Practice

## 2019-02-18 ENCOUNTER — Inpatient Hospital Stay (HOSPITAL_COMMUNITY): Payer: Medicare Other | Admitting: Anesthesiology

## 2019-02-18 ENCOUNTER — Other Ambulatory Visit: Payer: Self-pay

## 2019-02-18 ENCOUNTER — Inpatient Hospital Stay: Admit: 2019-02-18 | Payer: Medicare Other | Admitting: Orthopedic Surgery

## 2019-02-18 ENCOUNTER — Inpatient Hospital Stay (HOSPITAL_COMMUNITY): Payer: Medicare Other

## 2019-02-18 ENCOUNTER — Encounter (HOSPITAL_COMMUNITY)
Admission: AD | Disposition: A | Discharge status: Home Health | Payer: Self-pay | Source: Ambulatory Visit | Attending: Internal Medicine

## 2019-02-18 DIAGNOSIS — S72001A Fracture of unspecified part of neck of right femur, initial encounter for closed fracture: Secondary | ICD-10-CM

## 2019-02-18 HISTORY — DX: Fracture of unspecified part of neck of right femur, initial encounter for closed fracture: S72.001A

## 2019-02-18 HISTORY — PX: TOTAL HIP ARTHROPLASTY: SHX124

## 2019-02-18 LAB — BASIC METABOLIC PANEL
Anion gap: 9 (ref 5–15)
BUN: 35 mg/dL — ABNORMAL HIGH (ref 8–23)
CO2: 22 mmol/L (ref 22–32)
Calcium: 8.7 mg/dL — ABNORMAL LOW (ref 8.9–10.3)
Chloride: 107 mmol/L (ref 98–111)
Creatinine, Ser: 1.26 mg/dL — ABNORMAL HIGH (ref 0.44–1.00)
GFR calc Af Amer: 50 mL/min — ABNORMAL LOW (ref >60)
GFR calc non Af Amer: 43 mL/min — ABNORMAL LOW (ref >60)
Glucose, Bld: 104 mg/dL — ABNORMAL HIGH (ref 70–99)
Potassium: 4.3 mmol/L (ref 3.5–5.1)
Sodium: 138 mmol/L (ref 135–145)

## 2019-02-18 LAB — MAGNESIUM: Magnesium: 1.2 mg/dL — ABNORMAL LOW (ref 1.7–2.4)

## 2019-02-18 LAB — CBC
HCT: 26.4 % — ABNORMAL LOW (ref 36.0–46.0)
Hemoglobin: 8.4 g/dL — ABNORMAL LOW (ref 12.0–15.0)
MCH: 30.2 pg (ref 26.0–34.0)
MCHC: 31.8 g/dL (ref 30.0–36.0)
MCV: 95 fL (ref 80.0–100.0)
Platelets: 213 10*3/uL (ref 150–400)
RBC: 2.78 MIL/uL — ABNORMAL LOW (ref 3.87–5.11)
RDW: 14.5 % (ref 11.5–15.5)
WBC: 10 10*3/uL (ref 4.0–10.5)
nRBC: 0 % (ref 0.0–0.2)

## 2019-02-18 LAB — SURGICAL PCR SCREEN
MRSA, PCR: NEGATIVE
Staphylococcus aureus: NEGATIVE

## 2019-02-18 LAB — POCT I-STAT 4, (NA,K, GLUC, HGB,HCT)
Glucose, Bld: 118 mg/dL — ABNORMAL HIGH (ref 70–99)
HCT: 20 % — ABNORMAL LOW (ref 36.0–46.0)
Hemoglobin: 6.8 g/dL — CL (ref 12.0–15.0)
Potassium: 3.8 mmol/L (ref 3.5–5.1)
Sodium: 135 mmol/L (ref 135–145)

## 2019-02-18 LAB — PREPARE RBC (CROSSMATCH)

## 2019-02-18 SURGERY — ARTHROPLASTY, HIP, TOTAL,POSTERIOR APPROACH
Anesthesia: General | Laterality: Right

## 2019-02-18 MED ORDER — CEFAZOLIN SODIUM-DEXTROSE 2-3 GM-%(50ML) IV SOLR
INTRAVENOUS | Status: DC | PRN
  Administered 2019-02-18: 2 g via INTRAVENOUS

## 2019-02-18 MED ORDER — ENOXAPARIN SODIUM 40 MG/0.4ML ~~LOC~~ SOLN
40.0000 mg | SUBCUTANEOUS | Status: DC
  Administered 2019-02-19 – 2019-02-20 (×2): 40 mg via SUBCUTANEOUS
  Filled 2019-02-18 (×2): qty 0.4

## 2019-02-18 MED ORDER — BUPIVACAINE HCL (PF) 0.25 % IJ SOLN
INTRAMUSCULAR | Status: DC | PRN
  Administered 2019-02-18: 20 mL

## 2019-02-18 MED ORDER — ALUM & MAG HYDROXIDE-SIMETH 200-200-20 MG/5ML PO SUSP: 30.0000 mL | ORAL | Status: DC | PRN

## 2019-02-18 MED ORDER — HYDROMORPHONE HCL 1 MG/ML IJ SOLN
INTRAMUSCULAR | Status: AC
  Filled 2019-02-18: qty 1

## 2019-02-18 MED ORDER — ALBUMIN HUMAN 5 % IV SOLN
INTRAVENOUS | Status: DC | PRN
  Administered 2019-02-18: 15:00:00 via INTRAVENOUS

## 2019-02-18 MED ORDER — MORPHINE SULFATE (PF) 2 MG/ML IV SOLN
0.5000 mg | INTRAVENOUS | Status: DC | PRN
  Administered 2019-02-19 – 2019-02-20 (×6): 1 mg via INTRAVENOUS
  Filled 2019-02-18 (×6): qty 1

## 2019-02-18 MED ORDER — MAGNESIUM CITRATE PO SOLN: 1.0000 | Freq: Once | ORAL | Status: DC | PRN

## 2019-02-18 MED ORDER — ONDANSETRON HCL 4 MG PO TABS: 4.0000 mg | ORAL_TABLET | Freq: Four times a day (QID) | ORAL | Status: DC | PRN

## 2019-02-18 MED ORDER — ROCURONIUM BROMIDE 50 MG/5ML IV SOSY
PREFILLED_SYRINGE | INTRAVENOUS | Status: AC
  Filled 2019-02-18: qty 5

## 2019-02-18 MED ORDER — WHITE PETROLATUM EX OINT
TOPICAL_OINTMENT | CUTANEOUS | Status: AC
  Administered 2019-02-19: 03:00:00
  Filled 2019-02-18: qty 28.35

## 2019-02-18 MED ORDER — FENTANYL CITRATE (PF) 250 MCG/5ML IJ SOLN
INTRAMUSCULAR | Status: AC
  Filled 2019-02-18: qty 5

## 2019-02-18 MED ORDER — ACETAMINOPHEN 325 MG PO TABS: 325.0000 mg | ORAL_TABLET | Freq: Four times a day (QID) | ORAL | Status: DC | PRN

## 2019-02-18 MED ORDER — ONDANSETRON HCL 4 MG/2ML IJ SOLN
4.0000 mg | Freq: Four times a day (QID) | INTRAMUSCULAR | Status: DC | PRN
  Filled 2019-02-18: qty 2

## 2019-02-18 MED ORDER — ONDANSETRON HCL 4 MG/2ML IJ SOLN
INTRAMUSCULAR | Status: AC
  Filled 2019-02-18: qty 2

## 2019-02-18 MED ORDER — ROCURONIUM BROMIDE 50 MG/5ML IV SOSY
PREFILLED_SYRINGE | INTRAVENOUS | Status: AC
  Filled 2019-02-18: qty 10

## 2019-02-18 MED ORDER — HYDROMORPHONE HCL 1 MG/ML IJ SOLN
0.2500 mg | INTRAMUSCULAR | Status: DC | PRN
  Administered 2019-02-18: 1 mg via INTRAVENOUS
  Administered 2019-02-18: 0.5 mg via INTRAVENOUS

## 2019-02-18 MED ORDER — ESMOLOL HCL 100 MG/10ML IV SOLN
INTRAVENOUS | Status: AC
  Filled 2019-02-18: qty 20

## 2019-02-18 MED ORDER — SODIUM CHLORIDE 0.9 % IV SOLN: 10.0000 mL/h | Freq: Once | INTRAVENOUS | Status: DC

## 2019-02-18 MED ORDER — HYDROCODONE-ACETAMINOPHEN 7.5-325 MG PO TABS
1.0000 | ORAL_TABLET | ORAL | Status: DC | PRN
  Administered 2019-02-18 – 2019-02-20 (×5): 2 via ORAL
  Administered 2019-02-21: 1 via ORAL
  Administered 2019-02-21 (×2): 2 via ORAL
  Filled 2019-02-18 (×8): qty 2

## 2019-02-18 MED ORDER — CEFAZOLIN SODIUM-DEXTROSE 2-4 GM/100ML-% IV SOLN
2.0000 g | Freq: Four times a day (QID) | INTRAVENOUS | Status: AC
  Administered 2019-02-18 – 2019-02-19 (×2): 2 g via INTRAVENOUS
  Filled 2019-02-18 (×2): qty 100

## 2019-02-18 MED ORDER — HYDROCODONE-ACETAMINOPHEN 5-325 MG PO TABS
1.0000 | ORAL_TABLET | ORAL | Status: DC | PRN
  Administered 2019-02-18: 2 via ORAL
  Administered 2019-02-19: 1 via ORAL
  Administered 2019-02-20: 2 via ORAL
  Filled 2019-02-18 (×2): qty 2
  Filled 2019-02-18: qty 1

## 2019-02-18 MED ORDER — METHOCARBAMOL 500 MG PO TABS
ORAL_TABLET | ORAL | Status: AC
  Filled 2019-02-18: qty 1

## 2019-02-18 MED ORDER — LACTATED RINGERS IV SOLN
INTRAVENOUS | Status: DC
  Administered 2019-02-18: 14:00:00 via INTRAVENOUS

## 2019-02-18 MED ORDER — PROPOFOL 10 MG/ML IV BOLUS
INTRAVENOUS | Status: DC | PRN
  Administered 2019-02-18: 150 mg via INTRAVENOUS

## 2019-02-18 MED ORDER — ROCURONIUM BROMIDE 50 MG/5ML IV SOSY
PREFILLED_SYRINGE | INTRAVENOUS | Status: DC | PRN
  Administered 2019-02-18: 20 mg via INTRAVENOUS
  Administered 2019-02-18: 50 mg via INTRAVENOUS

## 2019-02-18 MED ORDER — MIDAZOLAM HCL 2 MG/2ML IJ SOLN
INTRAMUSCULAR | Status: AC
  Filled 2019-02-18: qty 2

## 2019-02-18 MED ORDER — CEFAZOLIN SODIUM 1 G IJ SOLR
INTRAMUSCULAR | Status: AC
  Filled 2019-02-18: qty 20

## 2019-02-18 MED ORDER — METOCLOPRAMIDE HCL 5 MG/ML IJ SOLN: 5.0000 mg | Freq: Three times a day (TID) | INTRAMUSCULAR | Status: DC | PRN

## 2019-02-18 MED ORDER — FENTANYL CITRATE (PF) 250 MCG/5ML IJ SOLN
INTRAMUSCULAR | Status: DC | PRN
  Administered 2019-02-18 (×8): 50 ug via INTRAVENOUS
  Administered 2019-02-18: 100 ug via INTRAVENOUS

## 2019-02-18 MED ORDER — ESMOLOL HCL 100 MG/10ML IV SOLN
INTRAVENOUS | Status: DC | PRN
  Administered 2019-02-18 (×2): 20 mg via INTRAVENOUS

## 2019-02-18 MED ORDER — VANCOMYCIN HCL IN DEXTROSE 1-5 GM/200ML-% IV SOLN
INTRAVENOUS | Status: AC
  Administered 2019-02-18: 1000 mg via INTRAVENOUS
  Filled 2019-02-18: qty 200

## 2019-02-18 MED ORDER — POLYETHYLENE GLYCOL 3350 17 G PO PACK: 17.0000 g | PACK | Freq: Every day | ORAL | Status: DC | PRN

## 2019-02-18 MED ORDER — ENOXAPARIN SODIUM 40 MG/0.4ML ~~LOC~~ SOLN: 40.0000 mg | SUBCUTANEOUS | 0 refills | Status: DC

## 2019-02-18 MED ORDER — SUCCINYLCHOLINE CHLORIDE 200 MG/10ML IV SOSY
PREFILLED_SYRINGE | INTRAVENOUS | Status: AC
  Filled 2019-02-18: qty 20

## 2019-02-18 MED ORDER — FERROUS SULFATE 325 (65 FE) MG PO TABS
325.0000 mg | ORAL_TABLET | Freq: Three times a day (TID) | ORAL | Status: DC
  Administered 2019-02-18 – 2019-02-21 (×9): 325 mg via ORAL
  Filled 2019-02-18 (×9): qty 1

## 2019-02-18 MED ORDER — BISACODYL 10 MG RE SUPP: 10.0000 mg | Freq: Every day | RECTAL | Status: DC | PRN

## 2019-02-18 MED ORDER — MIDAZOLAM HCL 5 MG/5ML IJ SOLN
INTRAMUSCULAR | Status: DC | PRN
  Administered 2019-02-18: 2 mg via INTRAVENOUS

## 2019-02-18 MED ORDER — KETOROLAC TROMETHAMINE 15 MG/ML IJ SOLN
7.5000 mg | Freq: Four times a day (QID) | INTRAMUSCULAR | Status: AC
  Administered 2019-02-18 – 2019-02-19 (×4): 7.5 mg via INTRAVENOUS
  Filled 2019-02-18 (×4): qty 1

## 2019-02-18 MED ORDER — METOCLOPRAMIDE HCL 5 MG PO TABS
5.0000 mg | ORAL_TABLET | Freq: Three times a day (TID) | ORAL | Status: DC | PRN
  Filled 2019-02-18: qty 2

## 2019-02-18 MED ORDER — SODIUM CHLORIDE 0.9 % IR SOLN
Status: DC | PRN
  Administered 2019-02-18: 1000 mL

## 2019-02-18 MED ORDER — DOCUSATE SODIUM 100 MG PO CAPS
100.0000 mg | ORAL_CAPSULE | Freq: Two times a day (BID) | ORAL | Status: DC
  Administered 2019-02-18 – 2019-02-21 (×6): 100 mg via ORAL
  Filled 2019-02-18 (×6): qty 1

## 2019-02-18 MED ORDER — DEXAMETHASONE SODIUM PHOSPHATE 10 MG/ML IJ SOLN
INTRAMUSCULAR | Status: AC
  Filled 2019-02-18: qty 1

## 2019-02-18 MED ORDER — PHENOL 1.4 % MT LIQD: 1.0000 | OROMUCOSAL | Status: DC | PRN

## 2019-02-18 MED ORDER — POTASSIUM CHLORIDE IN NACL 20-0.9 MEQ/L-% IV SOLN
INTRAVENOUS | Status: DC
  Administered 2019-02-18: 20:00:00 via INTRAVENOUS
  Administered 2019-02-19: 1000 mL via INTRAVENOUS
  Filled 2019-02-18 (×3): qty 1000

## 2019-02-18 MED ORDER — ACETAMINOPHEN 500 MG PO TABS
500.0000 mg | ORAL_TABLET | Freq: Four times a day (QID) | ORAL | Status: AC
  Administered 2019-02-18 – 2019-02-19 (×4): 500 mg via ORAL
  Filled 2019-02-18 (×4): qty 1

## 2019-02-18 MED ORDER — MENTHOL 3 MG MT LOZG: 1.0000 | LOZENGE | OROMUCOSAL | Status: DC | PRN

## 2019-02-18 MED ORDER — SUCCINYLCHOLINE CHLORIDE 200 MG/10ML IV SOSY
PREFILLED_SYRINGE | INTRAVENOUS | Status: DC | PRN
  Administered 2019-02-18: 100 mg via INTRAVENOUS

## 2019-02-18 MED ORDER — BUPIVACAINE HCL (PF) 0.25 % IJ SOLN
INTRAMUSCULAR | Status: AC
  Filled 2019-02-18: qty 30

## 2019-02-18 MED ORDER — HYDROCODONE-ACETAMINOPHEN 5-325 MG PO TABS
ORAL_TABLET | ORAL | Status: AC
  Filled 2019-02-18: qty 2

## 2019-02-18 MED ORDER — LIDOCAINE 2% (20 MG/ML) 5 ML SYRINGE
INTRAMUSCULAR | Status: DC | PRN
  Administered 2019-02-18: 100 mg via INTRAVENOUS

## 2019-02-18 MED ORDER — SODIUM CHLORIDE 0.9 % IV SOLN
INTRAVENOUS | Status: DC | PRN
  Administered 2019-02-18: 25 ug/min via INTRAVENOUS

## 2019-02-18 MED ORDER — DEXAMETHASONE SODIUM PHOSPHATE 10 MG/ML IJ SOLN
INTRAMUSCULAR | Status: DC | PRN
  Administered 2019-02-18: 4 mg via INTRAVENOUS

## 2019-02-18 MED ORDER — VANCOMYCIN HCL IN DEXTROSE 1-5 GM/200ML-% IV SOLN
1000.0000 mg | Freq: Once | INTRAVENOUS | Status: AC
  Administered 2019-02-18: 15:00:00 1000 mg via INTRAVENOUS

## 2019-02-18 SURGICAL SUPPLY — 55 items
BIT DRILL 5/64X5 DISP (BIT) ×3 IMPLANT
BLADE SAW SAG 73X25 THK (BLADE) ×1
BLADE SAW SGTL 73X25 THK (BLADE) ×2 IMPLANT
CLOSURE STERI-STRIP 1/2X4 (GAUZE/BANDAGES/DRESSINGS) ×2
CLOSURE WOUND 1/2 X4 (GAUZE/BANDAGES/DRESSINGS) ×1
CLSR STERI-STRIP ANTIMIC 1/2X4 (GAUZE/BANDAGES/DRESSINGS) ×4 IMPLANT
CUP ACET PINNACLE SECTR 48MM (Joint) ×1 IMPLANT
DRAPE INCISE IOBAN 66X45 STRL (DRAPES) IMPLANT
DRAPE ORTHO SPLIT 77X108 STRL (DRAPES) ×4
DRAPE SURG ORHT 6 SPLT 77X108 (DRAPES) ×2 IMPLANT
DRAPE U-SHAPE 47X51 STRL (DRAPES) ×3 IMPLANT
DRSG MEPILEX BORDER 4X12 (GAUZE/BANDAGES/DRESSINGS) IMPLANT
DRSG MEPILEX BORDER 4X8 (GAUZE/BANDAGES/DRESSINGS) ×3 IMPLANT
DURAPREP 26ML APPLICATOR (WOUND CARE) ×3 IMPLANT
ELECT CAUTERY BLADE 6.4 (BLADE) ×3 IMPLANT
ELECT REM PT RETURN 9FT ADLT (ELECTROSURGICAL) ×3
ELECTRODE REM PT RTRN 9FT ADLT (ELECTROSURGICAL) ×1 IMPLANT
ELIMINATOR HOLE APEX DEPUY (Hips) ×3 IMPLANT
GLOVE BIOGEL PI ORTHO PRO SZ8 (GLOVE) ×4
GLOVE ORTHO TXT STRL SZ7.5 (GLOVE) ×6 IMPLANT
GLOVE PI ORTHO PRO STRL SZ8 (GLOVE) ×2 IMPLANT
GLOVE SURG ORTHO 8.0 STRL STRW (GLOVE) ×6 IMPLANT
GOWN STRL REUS W/ TWL XL LVL3 (GOWN DISPOSABLE) ×3 IMPLANT
GOWN STRL REUS W/TWL 2XL LVL3 (GOWN DISPOSABLE) ×3 IMPLANT
GOWN STRL REUS W/TWL XL LVL3 (GOWN DISPOSABLE) ×6
HEAD FEM STD 32X+5 STRL (Hips) ×3 IMPLANT
HOOD PEEL AWAY FACE SHEILD DIS (HOOD) ×3 IMPLANT
HOOD PEEL AWAY FLYTE STAYCOOL (MISCELLANEOUS) ×3 IMPLANT
KIT BASIN OR (CUSTOM PROCEDURE TRAY) ×3 IMPLANT
KIT TURNOVER KIT B (KITS) ×3 IMPLANT
NDL SAFETY ECLIPSE 18X1.5 (NEEDLE) ×1 IMPLANT
NEEDLE HYPO 18GX1.5 SHARP (NEEDLE) ×2
NS IRRIG 1000ML POUR BTL (IV SOLUTION) ×3 IMPLANT
PACK TOTAL JOINT (CUSTOM PROCEDURE TRAY) ×3 IMPLANT
PAD ARMBOARD 7.5X6 YLW CONV (MISCELLANEOUS) ×6 IMPLANT
PINN ALTRX NEUT ID X OD 32X48 ×3 IMPLANT
PINNSECTOR W/GRIP ACE CUP 48MM (Joint) ×3 IMPLANT
PRESSURIZER FEMORAL UNIV (MISCELLANEOUS) IMPLANT
RETRIEVER SUT HEWSON (MISCELLANEOUS) ×3 IMPLANT
SCREW 6.5MMX25MM (Screw) ×3 IMPLANT
STRIP CLOSURE SKIN 1/2X4 (GAUZE/BANDAGES/DRESSINGS) ×2 IMPLANT
SUCTION FRAZIER HANDLE 10FR (MISCELLANEOUS) ×2
SUCTION TUBE FRAZIER 10FR DISP (MISCELLANEOUS) ×1 IMPLANT
SUT FIBERWIRE #2 38 REV NDL BL (SUTURE) ×9
SUT VIC AB 0 CT1 27 (SUTURE) ×2
SUT VIC AB 0 CT1 27XBRD ANBCTR (SUTURE) ×1 IMPLANT
SUT VIC AB 2-0 CT1 27 (SUTURE) ×2
SUT VIC AB 2-0 CT1 TAPERPNT 27 (SUTURE) ×1 IMPLANT
SUT VIC AB 3-0 SH 8-18 (SUTURE) ×3 IMPLANT
SUTURE FIBERWR#2 38 REV NDL BL (SUTURE) ×3 IMPLANT
SYR CONTROL 10ML LL (SYRINGE) ×3 IMPLANT
TAP DUOFIX SZ5 STD OFF (Hips) ×3 IMPLANT
TOWEL OR 17X24 6PK STRL BLUE (TOWEL DISPOSABLE) ×3 IMPLANT
TOWEL OR 17X26 10 PK STRL BLUE (TOWEL DISPOSABLE) ×3 IMPLANT
TRAY FOLEY W/BAG SLVR 14FR (SET/KITS/TRAYS/PACK) ×3 IMPLANT

## 2019-02-18 NOTE — Progress Notes (Signed)
Patient complaining of abdominal fullness and unable to void. Nurse tech bladder scanned patient for >999. Spoke with Dr. Wyline Copas who gave a verbal order for in & out catheter. In & out catheter performed with Glenford Bayley, RN. 925 of clear, yellow urine voided. Patient educated on importance of voiding post catheterzation. Patient verbalized understanding. Will continue to monitor.    Hiram Comber, RN 02/18/2019 12:01 PM

## 2019-02-18 NOTE — Consult Note (Signed)
ORTHOPAEDIC CONSULTATION  REQUESTING PHYSICIAN: Donne Hazel, MD  Chief Complaint: Right hip pain  HPI: [name] is a 68 y.o. female who complains of right hip pain after a fall that happened 2 days ago.  She has had hip pain that is been pre-existing, and progressive, got to be somewhat severe over the last couple of months but now is unable to ambulate.  She has a past history of multiple types of cancers, she has had kidney resections, and has multiple medical complications as indicated below.  Movement makes the hip worse, rest makes it better.  Past Medical History:  Diagnosis Date  . Anemia   . Anxiety   . Arthritis    "everywhere" (08/19/2013)  . Bilateral carpal tunnel syndrome   . Chest pain at rest, rule out pericarditis 11/27/2012  . CHF (congestive heart failure) ([place])   . Chronic kidney disease    dr Risa Grill  . Chronic lower back pain   . Closed displaced fracture of right femoral neck ([place]) 02/18/2019  . Complication of anesthesia    "woke up twice during knee replacement" (08/19/2013)  . Depression   . Dyspnea    w/ exertion    . Fibromyalgia   . Headache(784.0)    hx of migraines   . History of pericarditis, with pericardial window in 2009 11/27/2012  . Hx of ovarian cancer 11/27/2012  . Hypothyroidism 11/27/2012  . Iron deficiency anemia   . Kidney carcinoma ([place])    "both kidneys; ~ 3 yr apart" (08/19/2013)  . Lung cancer ([place]) 2018  . Melanoma of back ([place])   . Migraines   . Pneumonia    "several times; including today", RUL/notes 08/19/2013  . Squamous carcinoma    "face, side of my nose, chest; used acid to get rid of them" (08/19/2013)  . Venous insufficiency 11/27/2012   Past Surgical History:  Procedure Laterality Date  . ABDOMINAL HYSTERECTOMY  1979  . ANTERIOR LAT LUMBAR FUSION Left 07/16/2018   Procedure: Left Lumbar two-three  Anterolateral decompression/interbody fusion with lateral plate fixation;  Surgeon: Kristeen Miss, MD;  Location:  [facility];  Service: Neurosurgery;  Laterality: Left;  . APPENDECTOMY    . DOPPLER ECHOCARDIOGRAPHY  02/23/2011   LVEF>50%  . FRACTURE SURGERY    . KNEE ARTHROSCOPY Right    "twice before the replacement" (08/19/2013)  . LEFT OOPHORECTOMY Left 1979  . [place]   "ruptured disc"  . MELANOMA EXCISION  ~ 2010   "my lower back" (08/19/2013)  . ORIF HIP FRACTURE Left    x 2 age 38 and 4  . PARTIAL NEPHRECTOMY Right 2005; ~ 2008  . PERICARDIAL WINDOW  ~ 2012  . POSTERIOR LUMBAR FUSION  2002?; 03/2012  . REDUCTION MAMMAPLASTY Bilateral ~ 2008  . RIGHT OOPHORECTOMY  ~ 2007   "it had cancer in it" (08/19/2013)  . SHOULDER ARTHROSCOPY W/ ROTATOR CUFF REPAIR Right 1990's  . TONSILLECTOMY  1954  . TOTAL KNEE ARTHROPLASTY Right 1990's  . TOTAL KNEE ARTHROPLASTY Left 08/02/2015   Procedure: LEFT TOTAL KNEE ARTHROPLASTY;  Surgeon: Paralee Cancel, MD;  [facility];  Service: Orthopedics;  Laterality: Left;  . TOTAL SHOULDER REPLACEMENT Left 2006?  . TUBAL LIGATION  1952  . VIDEO ASSISTED THORACOSCOPY (VATS)/WEDGE RESECTION Right 09/28/2017   Procedure: RIGHT VIDEO ASSISTED THORACOSCOPY (VATS)/WEDGE RESECTION, LYMPH NODE DISSECTION ;  Surgeon: Melrose Nakayama, MD;  Location: [facility];  Service: Thoracic;  Laterality: Right;   Social  History   Socioeconomic History  . Marital status: Married    Spouse name: Not on file  . Number of children: Not on file  . Years of education: Not on file  . Highest education level: Not on file  Occupational History  . Not on file  Social Needs  . Financial resource strain: Not on file  . Food insecurity:    Worry: Not on file    Inability: Not on file  . Transportation needs:    Medical: Not on file    Non-medical: Not on file  Tobacco Use  . Smoking status: Former Smoker    Packs/day: 1.50    Years: 18.00    Pack years: 27.00    Types: Cigarettes    Last attempt to quit: 09/22/1985    Years since quitting: 33.4  . Smokeless  tobacco: Never Used  Substance and Sexual Activity  . Alcohol use: No  . Drug use: No    Comment: prescribed  . Sexual activity: Not Currently  Lifestyle  . Physical activity:    Days per week: Not on file    Minutes per session: Not on file  . Stress: Not on file  Relationships  . Social connections:    Talks on phone: Not on file    Gets together: Not on file    Attends religious service: Not on file    Active member of club or organization: Not on file    Attends meetings of clubs or organizations: Not on file    Relationship status: Not on file  Other Topics Concern  . Not on file  Social History Narrative   Married.  Lives with husband.  Two children one boy and one girl.  Three granddaughters and two great granddaughters.    Family History  Problem Relation Age of Onset  . Cancer Mother 54       Stomach  . Cancer Father 15       Lung cancer  . Bipolar disorder Sister    Allergies  Allergen Reactions  . Amoxicillin Other (See Comments)    UNSPECIFIED REACTION  Has patient had a PCN reaction causing immediate rash, facial/tongue/throat swelling, SOB or lightheadedness with hypotension: No Has patient had a PCN reaction causing severe rash involving mucus membranes or skin necrosis: No Has patient had a PCN reaction that required hospitalization No Has patient had a PCN reaction occurring within the last 10 years: No If all of the above answers are "NO", then may proceed with Cephalosporin use.  . Ampicillin Rash and Other (See Comments)    Has patient had a PCN reaction causing immediate rash, facial/tongue/throat swelling, SOB or lightheadedness with hypotension: No Has patient had a PCN reaction causing severe rash involving mucus membranes or skin necrosis: No Has patient had a PCN reaction that required hospitalization No Has patient had a PCN reaction occurring within the last 10 years: No If all of the above answers are "NO", then may proceed with Cephalosporin  use.  [place] [Buspirone] Nausea And Vomiting    Patient can tolerate     Positive ROS: All other systems have been reviewed and were otherwise negative with the exception of those mentioned in the HPI and as above.  Physical Exam: General: Alert, no acute distress Cardiovascular: No pedal edema Respiratory: No cyanosis, no use of accessory musculature GI: No organomegaly, abdomen is soft and non-tender Skin: No lesions in the area of chief complaint Neurologic: Sensation intact distally Psychiatric: Patient  is competent for consent with normal mood and affect Lymphatic: No axillary or cervical lymphadenopathy  MUSCULOSKELETAL: Right hip EHL and FHL are intact, leg is shortened and externally rotated.  Positive logroll.  Assessment: Principal Problem:   Closed displaced fracture of right femoral neck (HCC) Active Problems:   Hypothyroidism   Hx of ovarian cancer   Chronic pain syndrome   Generalized anxiety disorder   Weight loss   Leukocytosis   Normocytic normochromic anemia   Essential hypertension    Plan: This is an acute severe issue, and given her multiple complicated medical comorbidities is a fairly high risk for both morbidity and mortality.  She did test negative for MRSA on the current preoperative screen, but I am also going to cover her with vancomycin and a nasal Betadine swab.  Plan for right total hip replacement given pre-existing arthritis.  The risks benefits and alternatives were discussed with the patient including but not limited to the risks of nonoperative treatment, versus surgical intervention including infection, bleeding, nerve injury, periprosthetic fracture, the need for revision surgery, dislocation, leg length discrepancy, blood clots, cardiopulmonary complications, morbidity, mortality, among others, and they were willing to proceed.     Johnny Bridge, MD [facility] [phone]   02/18/2019 2:21 PM

## 2019-02-18 NOTE — Op Note (Signed)
02/17/2019 - 02/18/2019  4:59 PM  PATIENT:  [name]   MRN: [id number]  PRE-OPERATIVE DIAGNOSIS: Right displaced femoral head fracture, likely pathologic  POST-OPERATIVE DIAGNOSIS:  same  PROCEDURE:  Procedure(s): RIGHT TOTAL HIP ARTHROPLASTY  PREOPERATIVE INDICATIONS:    [name] is an 68 y.o. female who has a diagnosis of right femoral head fracture, with multiple other oncologic diagnoses, who had severe pain and inability to walk, presented to the office, and elected for surgical management after failing conservative treatment.  The risks benefits and alternatives were discussed with the patient including but not limited to the risks of nonoperative treatment, versus surgical intervention including infection, bleeding, nerve injury, periprosthetic fracture, the need for revision surgery, dislocation, leg length discrepancy, blood clots, cardiopulmonary complications, morbidity, mortality, among others, and they were willing to proceed.     OPERATIVE REPORT     SURGEON:  Marchia Bond, MD    ASSISTANT:  Joya Gaskins, OPA-C  (Present throughout the entire procedure,  necessary for completion of procedure in a timely manner, assisting with retraction, instrumentation, and closure)     ANESTHESIA: General  ESTIMATED BLOOD LOSS: 150 mL    COMPLICATIONS:  None.   SPECIMENS: I sent the femoral head and neck cut for pathology.    UNIQUE ASPECTS OF THE CASE: The femoral head fracture was fairly high, going through the head itself, and the consistency of the bone did not appear normal.  The acetabulum was fairly small, but I was able to get to a size 48.  COMPONENTS:  [employer] fit femur size 5 with a 32 mm +5 metallic head ball and a Gription Acetabular shell size 48, with a single cancellous screw for backup fixation, with an apex hole eliminator and a +4 neutral polyethylene liner.    PROCEDURE IN DETAIL:   The patient was met in the holding area and   identified.  The appropriate hip was identified and marked at the operative site.  The patient was then transported to the OR  and  placed under anesthesia.  At that point, the patient was  placed in the lateral decubitus position with the operative side up and  secured to the operating room table and all bony prominences padded.     The operative lower extremity was prepped from the iliac crest to the distal leg.  Sterile draping was performed.  Time out was performed prior to incision.      A routine posterolateral approach was utilized via sharp dissection  carried down to the subcutaneous tissue.  Gross bleeders were Bovie coagulated.  The iliotibial band was identified and incised along the length of the skin incision.  Self-retaining retractors were  inserted.  With the hip internally rotated, the short external rotators  were identified. The piriformis and capsule was tagged with FiberWire, and the hip capsule released in a T-type fashion.  The femoral neck was exposed, and I resected the femoral neck using the appropriate jig. This was performed at approximately a thumb's breadth above the lesser trochanter.    I then exposed the deep acetabulum, cleared out any tissue including the ligamentum teres.  A wing retractor was placed.  After adequate visualization, I excised the labrum, and then sequentially reamed.  I placed the trial acetabulum, which seated nicely, and then impacted the real cup into place.  Appropriate version and inclination was confirmed clinically matching their bony anatomy, and also with the use of the jig.  I placed  a cancellous screw to augment fixation.  A trial polyethylene liner was placed and the wing retractor removed.    I then prepared the proximal femur using the cookie-cutter, the lateralizing reamer, and then sequentially reamed and broached.  A trial broach, neck, and head was utilized, and I reduced the hip and it was found to have excellent stability with  functional range of motion. The trial components were then removed, and the real polyethylene liner was placed.  I then impacted the real femoral prosthesis into place into the appropriate version, slightly anteverted to the normal anatomy, and I impacted the real head ball into place. The hip was then reduced and taken through functional range of motion and found to have excellent stability. Leg lengths were restored.  I then used a 2 mm drill bits to pass the FiberWire suture from the capsule and piriformis through the greater trochanter, and secured this. Excellent posterior capsular repair was achieved. I also closed the T in the capsule.  I then irrigated the hip copiously again with pulse lavage, and repaired the fascia with Vicryl, followed by Vicryl for the subcutaneous tissue, Monocryl for the skin, Steri-Strips and sterile gauze. The wounds were injected. The patient was then awakened and returned to PACU in stable and satisfactory condition. There were no complications.  Marchia Bond, MD Orthopedic Surgeon [phone]   02/18/2019 4:59 PM

## 2019-02-18 NOTE — Transfer of Care (Signed)
Immediate Anesthesia Transfer of Care Note  Patient: [name]  Procedure(s) Performed: TOTAL HIP ARTHROPLASTY (Right )  Patient Location: PACU  Anesthesia Type:General  Level of Consciousness: awake, alert  and patient cooperative  Airway & Oxygen Therapy: Patient Spontanous Breathing and Patient connected to face mask oxygen  Post-op Assessment: Report given to RN and Post -op Vital signs reviewed and stable  Post vital signs: Reviewed and stable  Last Vitals:  Vitals Value Taken Time  BP 119/91 02/18/2019  5:31 PM  Temp    Pulse 102 02/18/2019  5:36 PM  Resp 16 02/18/2019  5:36 PM  SpO2 98 % 02/18/2019  5:36 PM  Vitals shown include unvalidated device data.  Last Pain:  Vitals:   02/18/19 1318  TempSrc: Oral  PainSc:       Patients Stated Pain Goal: 4 (40/35/24 8185)  Complications: No apparent anesthesia complications

## 2019-02-18 NOTE — Anesthesia Procedure Notes (Signed)
Procedure Name: Intubation Date/Time: 02/18/2019 2:44 PM Performed by: Renato Shin, CRNA Pre-anesthesia Checklist: Patient identified, Emergency Drugs available, Suction available and Patient being monitored Patient Re-evaluated:Patient Re-evaluated prior to induction Oxygen Delivery Method: Circle system utilized Preoxygenation: Pre-oxygenation with 100% oxygen Induction Type: IV induction and Rapid sequence Laryngoscope Size: Miller and 2 Grade View: Grade I Tube type: Oral Tube size: 7.0 mm Number of attempts: 1 Airway Equipment and Method: Stylet and Oral airway Placement Confirmation: ETT inserted through vocal cords under direct vision,  positive ETCO2 and breath sounds checked- equal and bilateral Secured at: 21 cm Tube secured with: Tape Dental Injury: Teeth and Oropharynx as per pre-operative assessment

## 2019-02-18 NOTE — Progress Notes (Signed)
Patient back in room from pacu. Alert and oriented x 4. Provided patient with menu to order dinner tray. C/o 8/10 R hip pain. Scheduled tylenol and toradol administered. Denies any other complaints. SCD's on. VSS. Will continue to monitor.     Hiram Comber, RN 02/18/2019 6:52 PM

## 2019-02-18 NOTE — Progress Notes (Signed)
PROGRESS NOTE    [name]  INO:[id number] DOB: 1951/07/14 DOA: 02/17/2019 PCP: Bernerd Limbo, MD    Brief Narrative:  68 y.o. female with medical history significant of lung cancer, kidney cancer status post partial nephrectomyCHF, CKD stage III, chronic pain, fibromyalgia, history of ovarian cancer, and hypothyroidism, who presented with complaints of right hip pain l found to have a displaced right femoral neck fracture in Dr. Mardelle Matte office.  Last reports following on the affected side sometime in February, but had imaging studies done that did not note any signs of a fracture.  Last night while standing in the kitchen she reports turning and feeling like her right hip gave way.  Thereafter, she was unable to bear any significant amount of weight on the affected side.  Complains of significant pain and muscle spasms in the left side.  Other associated symptoms include fatigue and weight loss.  She was evaluated by Dr. Mardelle Matte for symptoms and had x-ray imaging revealing a acute displaced right femoral neck fracture.  Dr. Mardelle Matte call and requested direct admission.  She has been being evaluated for colitis by gastroenterology.  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  At this time she reports being evaluated for need of resection of that part of her colon.  Reports that diarrhea symptoms resolved approximately 3 days ago and she is not noted any further bleeding.  Denies complaints of shortness of breath, chest pain, cough, nausea, vomiting, or dysuria. Furthermore, patient was scheduled to have nuclear stress testing on 4/14, but it was postponed due to COVID-19 for further evaluation of intermittent complaints of shortness of breath.  Assessment & Plan:   Principal Problem:   Closed displaced fracture of right femoral neck (HCC) Active  Problems:   Hypothyroidism   Hx of ovarian cancer   Chronic pain syndrome   Generalized anxiety disorder   Weight loss   Leukocytosis   Normocytic normochromic anemia   Essential hypertension  Displaced right femoral neck fracture:  -Patient presented from Dr. Mardelle Matte office after being found to have an acute displaced femoral neck fracture -NPO this AM -Continue analgesics as needed -Dr. Mardelle Matte of orthopedics consultative services, will follow-up for further recommendation   Normocytic normochromic anemia:  -Patient previously noted to have colitis and bleeding.   -Last required blood transfusion last month.  Denies having any abdominal pain, diarrhea, or bleeding at this time.  Hemoglobin on admission however 7.1.  Suspect symptoms could be related with ulcerative stricture found in the transverse colon earlier this month -Given one unit PRBC. Repeat CBC in AM  Leukocytosis: acute.  -WBC elevated 11.8 .   -Hemodynamically stable. Afebrile -Repeat WBC normalized -Recheck CBC in a.m.  Suspected acute kidney injury due to dehydration:  -Patient presents with creatinine 1.32 and BUN 38.  The elevated BUN to creatinine ratio concerning for prerenal cause of symptoms. -Normal saline IV fluids at 75 mL/h overnight -Recheck kidney function in a.m.  Chronic pain syndrome:  -Patient with history of chronic back pain.   -Home medications include MS Contin 30 mg twice daily, Flexeril, Robaxin, and tramadol as needed for pain -Continue MS Contin per home regimen. Cont analgesics as needed   Essential hypertension -Continue lisinopril and amlodipine as tolerated  Hypothyroidism: Last TSH noted to be 1.92 on 12/05/2018. -Continue levothyroxine as tolerated  Anxiety/depression -Continue BuSpar and  Wellbutrin as tolerated -Stable at present  History of cancer:  -Past medical history includes ovarian, renal, lung cancer, and skin.  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  Treated with a course of steroids.  Currently being evaluated by Dr. Hilliard Clark for resection of that part of colon.  Pathology not readily visible on previous biopsy. -Recommend outpatient follow up  History of sarcoidosis: Stable at this time.  Weight loss:  -Encourage PO intake when diet resumed.  GERD -Continue Protonix  DVT prophylaxis: SCD's Code Status: Full Family Communication: Pt in room, family not at bedside Disposition Plan: Uncertain at this time  Consultants:   Orthopedic Surgery  Procedures:   Hip surgery 4/28  Antimicrobials: Anti-infectives (From admission, onward)   Start     Dose/Rate Route Frequency Ordered Stop   02/18/19 1500  ceFAZolin (ANCEF) IVPB 2g/100 mL premix     2 g 200 mL/hr over 30 Minutes Intravenous To ShortStay Surgical 02/17/19 2327 02/19/19 1500   02/18/19 1430  vancomycin (VANCOCIN) IVPB 1000 mg/200 mL premix     1,000 mg 200 mL/hr over 60 Minutes Intravenous  Once 02/18/19 1423         Subjective: Complaining of hi pain  Objective: Vitals:   02/17/19 2052 02/17/19 2138 02/18/19 0605 02/18/19 1318  BP: (!) 123/56 (!) 119/49 (!) 107/44 (!) 130/57  Pulse:  81 84 94  Resp: 20   16  Temp: 98.8 F (37.1 C) 99.4 F (37.4 C) 99.1 F (37.3 C)   TempSrc: Oral Oral Oral   SpO2: 100% 100% 100% 98%  Weight:      Height:        Intake/Output Summary (Last 24 hours) at 02/18/2019 1500 Last data filed at 02/18/2019 1159 Gross per 24 hour  Intake 315 ml  Output 1775 ml  Net -1460 ml   Filed Weights   02/17/19 1355  Weight: 63.6 kg    Examination:  General exam: Appears calm and comfortable  Respiratory system: Clear to auscultation. Respiratory effort normal. Cardiovascular system: S1 & S2 heard, RRR Gastrointestinal system: Abdomen is nondistended, soft and nontender. No organomegaly or masses felt. Normal bowel sounds heard. Central nervous system: Alert and  oriented. No focal neurological deficits. Extremities: Symmetric 5 x 5 power. Skin: No rashes, lesions Psychiatry: Judgement and insight appear normal. Mood & affect appropriate.   Data Reviewed: I have personally reviewed following labs and imaging studies  CBC: Recent Labs  Lab 02/17/19 1447 02/18/19 0255  WBC 11.8* 10.0  NEUTROABS 8.7*  --   HGB 7.1* 8.4*  HCT 22.7* 26.4*  MCV 95.0 95.0  PLT 225 706   Basic Metabolic Panel: Recent Labs  Lab 02/17/19 1447 02/18/19 0255  NA 141 138  K 3.9 4.3  CL 105 107  CO2 26 22  GLUCOSE 126* 104*  BUN 38* 35*  CREATININE 1.32* 1.26*  CALCIUM 8.8* 8.7*  MG  --  1.2*   GFR: Estimated Creatinine Clearance: 37.9 mL/min (A) (by C-G formula based on SCr of 1.26 mg/dL (H)). Liver Function Tests: Recent Labs  Lab 02/17/19 1447  AST 16  ALT 19  ALKPHOS 58  BILITOT 0.7  PROT 5.7*  ALBUMIN 2.5*   No results for input(s): LIPASE, AMYLASE in the last 168 hours. No results for input(s): AMMONIA in the last 168 hours. Coagulation Profile: No results for input(s): INR, PROTIME in the last 168 hours. Cardiac Enzymes: No results for input(s): CKTOTAL,  CKMB, CKMBINDEX, TROPONINI in the last 168 hours. BNP (last 3 results) No results for input(s): PROBNP in the last 8760 hours. HbA1C: No results for input(s): HGBA1C in the last 72 hours. CBG: No results for input(s): GLUCAP in the last 168 hours. Lipid Profile: No results for input(s): CHOL, HDL, LDLCALC, TRIG, CHOLHDL, LDLDIRECT in the last 72 hours. Thyroid Function Tests: No results for input(s): TSH, T4TOTAL, FREET4, T3FREE, THYROIDAB in the last 72 hours. Anemia Panel: No results for input(s): VITAMINB12, FOLATE, FERRITIN, TIBC, IRON, RETICCTPCT in the last 72 hours. Sepsis Labs: No results for input(s): PROCALCITON, LATICACIDVEN in the last 168 hours.  Recent Results (from the past 240 hour(s))  MRSA PCR Screening     Status: None   Collection Time: 02/17/19  7:48 PM   Result Value Ref Range Status   MRSA by PCR NEGATIVE NEGATIVE Final    Comment:        The GeneXpert MRSA Assay (FDA approved for NASAL specimens only), is one component of a comprehensive MRSA colonization surveillance program. It is not intended to diagnose MRSA infection nor to guide or monitor treatment for MRSA infections. Performed at [facility], [place] [address], [place], [place] [address]   Surgical pcr screen     Status: None   Collection Time: 02/18/19  9:37 AM  Result Value Ref Range Status   MRSA, PCR NEGATIVE NEGATIVE Final   Staphylococcus aureus NEGATIVE NEGATIVE Final    Comment: (NOTE) The Xpert SA Assay (FDA approved for NASAL specimens in patients 54 years of age and older), is one component of a comprehensive surveillance program. It is not intended to diagnose infection nor to guide or monitor treatment. Performed at [facility], [place] [address] [place], [place] [address]      Radiology Studies: Chest Portable 1 View  Result Date: 02/17/2019 CLINICAL DATA:  Preop for hip surgery. EXAM: PORTABLE CHEST 1 VIEW COMPARISON:  Radiographs of December 25, 2018. FINDINGS: The heart size and mediastinal contours are within normal limits. No pneumothorax or pleural effusion is noted. No acute pulmonary disease is noted. Postsurgical changes are noted in right upper lobe. The visualized skeletal structures are unremarkable. IMPRESSION: No acute cardiopulmonary abnormality seen. Electronically Signed   By: Marijo Conception M.D.   On: 02/17/2019 16:16    Scheduled Meds: . [MAR Hold] amLODipine  10 mg Oral Daily  . [MAR Hold] buPROPion  150 mg Oral Daily  . [MAR Hold] busPIRone  15 mg Oral BID  . [MAR Hold] ferrous sulfate  325 mg Oral Daily  . [MAR Hold] levothyroxine  100 mcg Oral Daily  . [MAR Hold] lisinopril  10 mg Oral Daily  . [MAR Hold] morphine  30 mg Oral Q12H  . [MAR Hold] pantoprazole  40 mg Oral Daily  . povidone-iodine  2 application Topical  Once  . [MAR Hold] zolpidem  10 mg Oral QHS   Continuous Infusions: . sodium chloride 75 mL/hr at 02/18/19 0629  .  ceFAZolin (ANCEF) IV    . lactated ringers 50 mL/hr at 02/18/19 1409  . vancomycin 1,000 mg (02/18/19 1430)     LOS: 1 day   Marylu Lund, MD [facility] Pager On Amion  If 7PM-7AM, please contact night-coverage 02/18/2019, 3:00 PM

## 2019-02-18 NOTE — Anesthesia Postprocedure Evaluation (Signed)
Anesthesia Post Note  Patient: [name]  Procedure(s) Performed: TOTAL HIP ARTHROPLASTY (Right )     Patient location during evaluation: PACU Anesthesia Type: General Level of consciousness: awake Pain management: pain level controlled Vital Signs Assessment: post-procedure vital signs reviewed and stable Respiratory status: spontaneous breathing Cardiovascular status: stable Postop Assessment: no apparent nausea or vomiting Anesthetic complications: no    Last Vitals:  Vitals:   02/18/19 1800 02/18/19 1851  BP: 133/80 (!) 122/55  Pulse: 97 90  Resp: 11 14  Temp:  36.8 C  SpO2: 100% 100%    Last Pain:  Vitals:   02/18/19 1851  TempSrc: Oral  PainSc:                  [name]

## 2019-02-18 NOTE — Progress Notes (Signed)
Dr. Mardelle Matte requested nasal betadine be given in Short Stay. Aware of negative PCR.

## 2019-02-18 NOTE — Progress Notes (Signed)
Report received from [place], [place], [place].

## 2019-02-18 NOTE — Anesthesia Preprocedure Evaluation (Addendum)
Anesthesia Evaluation  Patient identified by MRN, date of birth, ID band Patient awake    Airway Mallampati: II  TM Distance: >3 FB     Dental   Pulmonary shortness of breath, sleep apnea , pneumonia, former smoker,    breath sounds clear to auscultation       Cardiovascular hypertension, +CHF   Rhythm:Regular Rate:Normal     Neuro/Psych    GI/Hepatic   Endo/Other  Hypothyroidism   Renal/GU Renal disease     Musculoskeletal  (+) Arthritis , Fibromyalgia -  Abdominal   Peds  Hematology   Anesthesia Other Findings   Reproductive/Obstetrics                            Anesthesia Physical Anesthesia Plan  ASA: III  Anesthesia Plan: General   Post-op Pain Management:    Induction: Intravenous  PONV Risk Score and Plan:   Airway Management Planned: Oral ETT  Additional Equipment:   Intra-op Plan:   Post-operative Plan: Possible Post-op intubation/ventilation  Informed Consent:     Dental advisory given  Plan Discussed with: CRNA and Anesthesiologist  Anesthesia Plan Comments:         Anesthesia Quick Evaluation

## 2019-02-19 ENCOUNTER — Inpatient Hospital Stay (HOSPITAL_COMMUNITY): Payer: Medicare Other

## 2019-02-19 LAB — CBC
HCT: 24.7 % — ABNORMAL LOW (ref 36.0–46.0)
Hemoglobin: 8 g/dL — ABNORMAL LOW (ref 12.0–15.0)
MCH: 30.1 pg (ref 26.0–34.0)
MCHC: 32.4 g/dL (ref 30.0–36.0)
MCV: 92.9 fL (ref 80.0–100.0)
Platelets: 188 10*3/uL (ref 150–400)
RBC: 2.66 MIL/uL — ABNORMAL LOW (ref 3.87–5.11)
RDW: 15.6 % — ABNORMAL HIGH (ref 11.5–15.5)
WBC: 10.5 10*3/uL (ref 4.0–10.5)
nRBC: 0 % (ref 0.0–0.2)

## 2019-02-19 LAB — BPAM RBC
Blood Product Expiration Date: 202005192359
Blood Product Expiration Date: 202005202359
ISSUE DATE / TIME: 202004271815
ISSUE DATE / TIME: 202004281650
Unit Type and Rh: 8400
Unit Type and Rh: 8400

## 2019-02-19 LAB — TYPE AND SCREEN
ABO/RH(D): AB POS
Antibody Screen: NEGATIVE
Unit division: 0
Unit division: 0

## 2019-02-19 LAB — BASIC METABOLIC PANEL
Anion gap: 8 (ref 5–15)
BUN: 27 mg/dL — ABNORMAL HIGH (ref 8–23)
CO2: 22 mmol/L (ref 22–32)
Calcium: 8.3 mg/dL — ABNORMAL LOW (ref 8.9–10.3)
Chloride: 108 mmol/L (ref 98–111)
Creatinine, Ser: 1.03 mg/dL — ABNORMAL HIGH (ref 0.44–1.00)
GFR calc Af Amer: >60 mL/min (ref >60)
GFR calc non Af Amer: 55 mL/min — ABNORMAL LOW (ref >60)
Glucose, Bld: 153 mg/dL — ABNORMAL HIGH (ref 70–99)
Potassium: 4.7 mmol/L (ref 3.5–5.1)
Sodium: 138 mmol/L (ref 135–145)

## 2019-02-19 MED ORDER — IOHEXOL 300 MG/ML  SOLN
100.0000 mL | Freq: Once | INTRAMUSCULAR | Status: AC | PRN
  Administered 2019-02-19: 100 mL via INTRAVENOUS

## 2019-02-19 NOTE — Progress Notes (Signed)
PT Evaluation   02/19/19 1555  PT Visit Information  Last PT Received On 02/19/19  Assistance Needed +1  PT/OT/SLP Co-Evaluation/Treatment Yes  Reason for Co-Treatment Complexity of the patient's impairments (multi-system involvement);To address functional/ADL transfers  PT goals addressed during session Mobility/safety with mobility;Proper use of DME;Balance  History of Present Illness [name] is a 68 y.o. female that was turning in kitchen and caused R hip fx requiring R THA with posterior hip precautions. PMH significant oflung cancer, kidney cancer status post partial nephrectomy CHF, CKD, fibromyalgia, history of ovarian cancer, hypothyroidism.   Precautions  Precautions Posterior Hip;Fall  Precaution Booklet Issued No  Precaution Comments Verbally reviewed posterior hip precautions with pt.  Restrictions  Weight Bearing Restrictions Yes  RLE Weight Bearing WBAT  Home Living  Family/patient expects to be discharged to: Private residence  Living Arrangements Spouse/significant other  Available Help at Discharge Family;Available 24 hours/day  Type of Home House  Home Access Stairs to enter  Entrance Stairs-Number of Steps 3  Entrance Stairs-Rails Right;Left  Home Layout One level  Bathroom Shower/Tub Walk-in [employer] forces technical officer - single point  Prior Function  Level of Independence Independent  Comments reports cooking, cleaning and performing own ADL and driving  Communication  Communication No difficulties  Pain Assessment  Pain Assessment 0-10  Pain Score 6  Pain Location R hip  Pain Descriptors / Indicators Discomfort;Tightness  Pain Intervention(s) Limited activity within patient's tolerance;Monitored during session;Repositioned  Cognition  Arousal/Alertness Awake/alert  Behavior During Therapy WFL for tasks assessed/performed  Overall Cognitive Status Within Functional Limits for tasks assessed  Upper Extremity Assessment   Upper Extremity Assessment Defer to OT evaluation  Lower Extremity Assessment  Lower Extremity Assessment RLE deficits/detail  RLE Deficits / Details Deficits consistent with post op pain and weakness.   Cervical / Trunk Assessment  Cervical / Trunk Assessment Normal  Bed Mobility  Overal bed mobility Needs Assistance  Bed Mobility Supine to Sit  Supine to sit Mod assist;HOB elevated  General bed mobility comments modA for RLE and trunk elevation  Transfers  Overall transfer level Needs assistance  Equipment used Rolling walker (2 wheeled)  Transfers Sit to/from [employer] Transfers  Sit to Stand Min assist;From elevated surface  Stand pivot transfers Min guard  General transfer comment Min A for lift assist and steadying. Min guard to perform stand pivot to recliner. Cues to kick out RLE to maintain precautions. Once sitting, pt reports she needed to go to the bathroom, so ambulated to bathroom.   Ambulation/Gait  Ambulation/Gait assistance Min guard  Gait Distance (Feet) 10 Feet  Assistive device Rolling walker (2 wheeled)  Gait Pattern/deviations Step-to pattern;Decreased step length - right;Decreased step length - left;Decreased weight shift to right;Antalgic  General Gait Details Slow, antalgic gait. Limited weightshift to RLE secondary to pain. Cues for appropriate use of RW.   Gait velocity Decreased   Balance  Overall balance assessment Needs assistance  Sitting-balance support No upper extremity supported;Feet supported  Sitting balance-Leahy Scale Fair  Standing balance support Bilateral upper extremity supported;During functional activity  Standing balance-Leahy Scale Poor  Standing balance comment reliant on RW and minguardA for balance  Exercises  Exercises General Lower Extremity  General Exercises - Lower Extremity  Ankle Circles/Pumps AROM;5 reps;Seated  PT - End of Session  Equipment Utilized During Treatment Gait belt  Activity Tolerance Patient  tolerated treatment well  Patient left Other (comment) (in bathroom with OT present )  Nurse Communication Mobility status;Precautions  PT Assessment  PT Recommendation/Assessment Patient needs continued PT services  PT Visit Diagnosis Unsteadiness on feet (R26.81);Muscle weakness (generalized) (M62.81);Difficulty in walking, not elsewhere classified (R26.2);Pain  Pain - Right/Left Right  Pain - part of body Hip  PT Problem List Decreased strength;Decreased range of motion;Decreased balance;Decreased mobility;Decreased activity tolerance;Decreased knowledge of precautions;Decreased knowledge of use of DME;Pain  PT Plan  PT Frequency (ACUTE ONLY) Min 5X/week  PT Treatment/Interventions (ACUTE ONLY) DME instruction;Gait training;Functional mobility training;Stair training;Therapeutic activities;Therapeutic exercise;Balance training;Patient/family education  AM-PAC PT "6 Clicks" Mobility Outcome Measure (Version 2)  Help needed turning from your back to your side while in a flat bed without using bedrails? 2  Help needed moving from lying on your back to sitting on the side of a flat bed without using bedrails? 2  Help needed moving to and from a bed to a chair (including a wheelchair)? 3  Help needed standing up from a chair using your arms (e.g., wheelchair or bedside chair)? 3  Help needed to walk in hospital room? 3  Help needed climbing 3-5 steps with a railing?  2  6 Click Score 15  Consider Recommendation of Discharge To: CIR/SNF/LTACH  PT Recommendation  Follow Up Recommendations Home health PT;Supervision for mobility/OOB  PT equipment Rolling walker with 5" wheels;3in1 (PT)  Individuals Consulted  Consulted and Agree with Results and Recommendations Patient  Acute Rehab PT Goals  Patient Stated Goal to go home  PT Goal Formulation With patient  Time For Goal Achievement 03/05/19  Potential to Achieve Goals Good  PT Time Calculation  PT Start Time (ACUTE ONLY) 1054  PT Stop  Time (ACUTE ONLY) 1117  PT Time Calculation (min) (ACUTE ONLY) 23 min  PT General Charges  $$ ACUTE PT VISIT 1 Visit  PT Evaluation  $PT Eval Moderate Complexity 1 Mod  Written Expression  Dominant Hand Right   Pt is s/p surgery above with deficits below. Pt requiring mod A for bed mobility and min to min guard A for short distance ambulation this session. Reviewed posterior hip precautions and plan to review HEP during next session. Feel pt will progress well and be able to d/c home with HHPT. Will continue to follow acutely to maximize functional mobility independence and safety.   Leighton Ruff, PT, DPT  Acute Rehabilitation Services  Pager: [phone] Office: [phone]

## 2019-02-19 NOTE — Progress Notes (Signed)
02/19/19 1615  PT Visit Information  Last PT Received On 02/19/19  Assistance Needed +1  History of Present Illness [name] is a 68 y.o. female that was turning in kitchen and caused R hip fx requiring R THA with posterior hip precautions. PMH significant oflung cancer, kidney cancer status post partial nephrectomy CHF, CKD, fibromyalgia, history of ovarian cancer, hypothyroidism.   Subjective Data  Patient Stated Goal to go home  Precautions  Precautions Posterior Hip;Fall  Precaution Booklet Issued Yes (comment)  Precaution Comments Reviewed and gave handout of posterior hip precautions.   Restrictions  Weight Bearing Restrictions Yes  RLE Weight Bearing WBAT  Pain Assessment  Pain Assessment 0-10  Pain Score 7  Pain Location R hip  Pain Descriptors / Indicators Discomfort;Tightness  Pain Intervention(s) Limited activity within patient's tolerance;Monitored during session;Repositioned  Cognition  Arousal/Alertness Awake/alert  Behavior During Therapy WFL for tasks assessed/performed  Overall Cognitive Status Within Functional Limits for tasks assessed  Bed Mobility  General bed mobility comments Pt reporting increased pain, so refusing OOB. However, was agreeable to perform HEP   General Comments  General comments (skin integrity, edema, etc.) Gave THA HEP this session and practiced exercises. Reviewed sleeping with pillow in between legs in order to maintain posterior hip precautions.   Exercises  Exercises Total Joint  Total Joint Exercises  Ankle Circles/Pumps AROM;Both;20 reps;Supine  Quad Sets AROM;Right;10 reps;Supine  Short Arc Quad AROM;Right;10 reps;Supine  Heel Slides AROM;Right;10 reps;Supine (partial ROM )  PT - End of Session  Equipment Utilized During Treatment Gait belt  Activity Tolerance Patient tolerated treatment well  Patient left in bed;with call bell/phone within reach  Nurse Communication Mobility status   PT - Assessment/Plan  PT Plan  Current plan remains appropriate  PT Visit Diagnosis Unsteadiness on feet (R26.81);Muscle weakness (generalized) (M62.81);Difficulty in walking, not elsewhere classified (R26.2);Pain  Pain - Right/Left Right  Pain - part of body Hip  PT Frequency (ACUTE ONLY) Min 5X/week  Follow Up Recommendations Home health PT;Supervision for mobility/OOB  PT equipment Rolling walker with 5" wheels;3in1 (PT)  AM-PAC PT "6 Clicks" Mobility Outcome Measure (Version 2)  Help needed turning from your back to your side while in a flat bed without using bedrails? 2  Help needed moving from lying on your back to sitting on the side of a flat bed without using bedrails? 2  Help needed moving to and from a bed to a chair (including a wheelchair)? 3  Help needed standing up from a chair using your arms (e.g., wheelchair or bedside chair)? 3  Help needed to walk in hospital room? 3  Help needed climbing 3-5 steps with a railing?  2  6 Click Score 15  Consider Recommendation of Discharge To: CIR/SNF/LTACH  PT Goal Progression  Progress towards PT goals Progressing toward goals  Acute Rehab PT Goals  PT Goal Formulation With patient  Time For Goal Achievement 03/05/19  Potential to Achieve Goals Good  PT Time Calculation  PT Start Time (ACUTE ONLY) 1413  PT Stop Time (ACUTE ONLY) 1430  PT Time Calculation (min) (ACUTE ONLY) 17 min  PT General Charges  $$ ACUTE PT VISIT 1 Visit  PT Treatments  $Therapeutic Exercise 8-22 mins   Pt reporting increased pain, and did not want to perform OOB mobility, however, was agreeable to performing HEP. Reviewed supine THA handout and performed LE exercises. Also gave posterior hip precaution handout and reviewed how to maintain precautions when sleeping on side, etc. Current  recommendations appropriate. Will continue to follow acutely to maximize functional mobility independence and safety.   Leighton Ruff, PT, DPT  Acute Rehabilitation Services  Pager: [phone] Office: [phone]

## 2019-02-19 NOTE — Progress Notes (Addendum)
Patient ID: [name], female   DOB: 06-Nov-1950, 68 y.o.   MRN: [id number]     Subjective:  Patient reports pain as mild to moderate.  Patient reports improvement.    Objective:   VITALS:   Vitals:   02/18/19 2045 02/18/19 2151 02/18/19 2302 02/19/19 0547  BP: (!) 102/44 (!) 100/48 (!) 111/54 (!) 108/50  Pulse: 92 90 79 80  Resp: 16 16 18    Temp: 98.4 F (36.9 C) 98 F (36.7 C) 98.4 F (36.9 C) 98.3 F (36.8 C)  TempSrc: Oral Oral Oral Oral  SpO2: 100% 98% 98% 98%  Weight:      Height:        ABD soft Sensation intact distally Dorsiflexion/Plantar flexion intact Incision: dressing C/D/I and scant drainage   Lab Results  Component Value Date   WBC 10.5 02/19/2019   HGB 8.0 (L) 02/19/2019   HCT 24.7 (L) 02/19/2019   MCV 92.9 02/19/2019   PLT 188 02/19/2019   BMET    Component Value Date/Time   NA 138 02/19/2019 0329   NA 144 03/13/2018 1427   K 4.7 02/19/2019 0329   CL 108 02/19/2019 0329   CO2 22 02/19/2019 0329   GLUCOSE 153 (H) 02/19/2019 0329   BUN 27 (H) 02/19/2019 0329   BUN 42 (H) 03/13/2018 1427   CREATININE 1.03 (H) 02/19/2019 0329   CREATININE 1.27 (H) 10/31/2018 1429   CREATININE 1.33 (H) 09/02/2015 1220   CALCIUM 8.3 (L) 02/19/2019 0329   GFRNONAA 55 (L) 02/19/2019 0329   GFRNONAA 43 (L) 10/31/2018 1429   GFRNONAA 42 (L) 09/02/2015 1220   GFRAA >60 02/19/2019 0329   GFRAA 50 (L) 10/31/2018 1429   GFRAA 48 (L) 09/02/2015 1220     Assessment/Plan: 1 Day Post-Op   Principal Problem:   Closed displaced fracture of right femoral neck (HCC) Active Problems:   Hypothyroidism   Hx of ovarian cancer   Chronic pain syndrome   Generalized anxiety disorder   Weight loss   Leukocytosis   Normocytic normochromic anemia   Essential hypertension   Advance diet Up with therapy Continue plan per medicine WBAT Dry dressing PRN   Lunette Stands 02/19/2019, 9:24 AM  Discussed and agree with above.  ABLA, acute on chronic, she has  had about 3 units so far. Now better than admission hgb.  Lovenox, WBAT, chronic narcotics for pain control.  Doing better already.    Marchia Bond, MD Cell [phone]

## 2019-02-19 NOTE — Progress Notes (Signed)
Patient able to void after foley was removed (344ml). Will continue to monitor.

## 2019-02-19 NOTE — Progress Notes (Signed)
PROGRESS NOTE  [name] ZYS:[id number] DOB: 1951/08/18 DOA: 02/17/2019 PCP: Bernerd Limbo, MD   LOS: 2 days   Brief Narrative / Interim history: 68 y.o.femalewith medical history significant oflung cancer, kidney cancer status post partial nephrectomyCHF, CKDstage III, chronic pain, fibromyalgia, history of ovarian cancer, and hypothyroidism, who presentedwith complaints of right hip pain l found to have a displaced right femoral neck fracture in Dr. Mardelle Matte office.Last reports following on the affected side sometime in February, but had imaging studies done that did not note any signs of a fracture. Last night while standing in the kitchen she reports turning and feeling like her right hip gave way. Thereafter, she was unable to bear any significant amount of weight on the affected side.Complains of significant pain and muscle spasms in the left side. Other associated symptoms include fatigue and weight loss. She was evaluated by Dr. Mardelle Matte for symptoms and had x-ray imaging revealing a acute displaced right femoral neckfracture. Dr. Mardelle Matte call and requested direct admission. She has been being evaluated for colitis by gastroenterology. 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 At this time she reports being evaluated for need of resection ofthatpart of her colon. Reports that diarrhea symptoms resolved approximately 3 days ago and she is not noted any further bleeding. Denies complaints of shortness of breath, chest pain, cough, nausea, vomiting, ordysuria. Furthermore, patient was scheduled to have nuclear stress testing on 4/14, but it was postponed due to COVID-30for further evaluation ofintermittent complaints of shortness of breath.  Subjective: Doing well this morning, denies any significant complaints other than hip  soreness.  Denies any chest pain, no nausea or vomiting.  Assessment & Plan: Principal Problem:   Closed displaced fracture of right femoral neck (HCC) Active Problems:   Hypothyroidism   Hx of ovarian cancer   Chronic pain syndrome   Generalized anxiety disorder   Weight loss   Leukocytosis   Normocytic normochromic anemia   Essential hypertension   Principal Problem Displaced right femoral neck fracture -This is felt to be pathologic, given significant oncologic history, she is status post right total hip arthroplasty by Dr. Mardelle Matte on 02/18/2019 -PT to evaluate, suspect may need SNF however patient wants to go home  Active Problems Normocytic normochromic anemia -Patient has a history of colitis/bleeding, required blood transfusion last month.  Hemoglobin this admission was 7.1.  She was found to have a stricture in the transverse colon earlier this month and underwent colonoscopy with [facility], Dr. Delle Reining earlier this month.  I do not have access to the colonoscopy report.  Question whether she could tolerate DVT prophylaxis in forms of anticoagulation for #1.  I have placed a call to the GI office for [facility], her primary gastroenterologist is on vacation for the next week but I am awaiting a callback from 1 of his partners for GI opinion regarding anticoagulation -She has received total of 3 units of packed red blood cells, continue to monitor hemoglobin  Chronic kidney disease stage III -Creatinine on admission 1.32, her baseline creatinine 1.3-1.5, appears at baseline  Hypertension -Continue home medications, blood pressure stable  Chronic pain syndrome -Continue home medications  Hypothyroidism -Continue Synthroid  Anxiety/depression -Continue BuSpar and Wellbutrin  History of multiple cancers -Including ovarian, renal, lung cancer and skin cancer  Colonic stricture -See discussion above  History of sarcoidosis -Stable  Scheduled  Meds:  .  amLODipine  10 mg Oral Daily  . buPROPion  150 mg Oral Daily  . busPIRone  15 mg Oral BID  . docusate sodium  100 mg Oral BID  . enoxaparin (LOVENOX) injection  40 mg Subcutaneous Q24H  . ferrous sulfate  325 mg Oral TID PC  . levothyroxine  100 mcg Oral Daily  . lisinopril  10 mg Oral Daily  . morphine  30 mg Oral Q12H  . pantoprazole  40 mg Oral Daily  . zolpidem  10 mg Oral QHS   Continuous Infusions: . 0.9 % NaCl with KCl 20 mEq / L 1,000 mL (02/19/19 0931)   PRN Meds:.acetaminophen, alum & mag hydroxide-simeth, bisacodyl, cyclobenzaprine, diclofenac sodium, diphenoxylate-atropine, HYDROcodone-acetaminophen, HYDROcodone-acetaminophen, magnesium citrate, menthol-cetylpyridinium **OR** phenol, methocarbamol, metoCLOPramide **OR** metoCLOPramide (REGLAN) injection, morphine injection, ondansetron **OR** ondansetron (ZOFRAN) IV, polyethylene glycol  DVT prophylaxis: Lovenox Code Status: Full code Family Communication: no family at bedside  Disposition Plan: TBD  Consultants:   Orthopedic surgery, Dr. Mardelle Matte   Procedures:   THA 4/28  Antimicrobials:  None    Objective: Vitals:   02/18/19 2045 02/18/19 2151 02/18/19 2302 02/19/19 0547  BP: (!) 102/44 (!) 100/48 (!) 111/54 (!) 108/50  Pulse: 92 90 79 80  Resp: 16 16 18    Temp: 98.4 F (36.9 C) 98 F (36.7 C) 98.4 F (36.9 C) 98.3 F (36.8 C)  TempSrc: Oral Oral Oral Oral  SpO2: 100% 98% 98% 98%  Weight:      Height:        Intake/Output Summary (Last 24 hours) at 02/19/2019 1245 Last data filed at 02/19/2019 1144 Gross per 24 hour  Intake 4216.03 ml  Output 1800 ml  Net 2416.03 ml   Filed Weights   02/17/19 1355  Weight: 63.6 kg    Examination:  Constitutional: NAD Eyes: PERRL, lids and conjunctivae normal ENMT: Mucous membranes are moist.  Respiratory: clear to auscultation bilaterally, no wheezing, no crackles. Normal respiratory effort.  Cardiovascular: Regular rate and rhythm, no murmurs / rubs /  gallops. No LE edema.  Abdomen: no tenderness. Bowel sounds positive.  Musculoskeletal: no clubbing / cyanosis.  Skin: no rashes Neurologic: CN 2-12 grossly intact. Strength 5/5 in all 4.  Psychiatric: Normal judgment and insight. Alert and oriented x 3. Normal mood.    Data Reviewed: I have independently reviewed following labs and imaging studies   CBC: Recent Labs  Lab 02/17/19 1447 02/18/19 0255 02/18/19 1639 02/19/19 0329  WBC 11.8* 10.0  --  10.5  NEUTROABS 8.7*  --   --   --   HGB 7.1* 8.4* 6.8* 8.0*  HCT 22.7* 26.4* 20.0* 24.7*  MCV 95.0 95.0  --  92.9  PLT 225 213  --  301   Basic Metabolic Panel: Recent Labs  Lab 02/17/19 1447 02/18/19 0255 02/18/19 1639 02/19/19 0329  NA 141 138 135 138  K 3.9 4.3 3.8 4.7  CL 105 107  --  108  CO2 26 22  --  22  GLUCOSE 126* 104* 118* 153*  BUN 38* 35*  --  27*  CREATININE 1.32* 1.26*  --  1.03*  CALCIUM 8.8* 8.7*  --  8.3*  MG  --  1.2*  --   --    GFR: Estimated Creatinine Clearance: 46.4 mL/min (A) (by C-G formula based on SCr of 1.03 mg/dL (H)). Liver Function Tests: Recent Labs  Lab 02/17/19 1447  AST 16  ALT 19  ALKPHOS 58  BILITOT 0.7  PROT 5.7*  ALBUMIN 2.5*   No results for input(s): LIPASE, AMYLASE in the last 168 hours. No results for input(s): AMMONIA in the last 168 hours. Coagulation Profile: No results for input(s): INR, PROTIME in the last 168 hours. Cardiac Enzymes: No results for input(s): CKTOTAL, CKMB, CKMBINDEX, TROPONINI in the last 168 hours. BNP (last 3 results) No results for input(s): PROBNP in the last 8760 hours. HbA1C: No results for input(s): HGBA1C in the last 72 hours. CBG: No results for input(s): GLUCAP in the last 168 hours. Lipid Profile: No results for input(s): CHOL, HDL, LDLCALC, TRIG, CHOLHDL, LDLDIRECT in the last 72 hours. Thyroid Function Tests: No results for input(s): TSH, T4TOTAL, FREET4, T3FREE, THYROIDAB in the last 72 hours. Anemia Panel: No results for  input(s): VITAMINB12, FOLATE, FERRITIN, TIBC, IRON, RETICCTPCT in the last 72 hours. Urine analysis:    Component Value Date/Time   COLORURINE YELLOW 12/25/2018 1348   APPEARANCEUR CLEAR 12/25/2018 1348   LABSPEC 1.012 12/25/2018 1348   PHURINE 5.0 12/25/2018 1348   GLUCOSEU NEGATIVE 12/25/2018 1348   HGBUR NEGATIVE 12/25/2018 [place] 12/25/2018 1348   KETONESUR NEGATIVE 12/25/2018 1348   PROTEINUR NEGATIVE 12/25/2018 1348   UROBILINOGEN 1.0 07/29/2015 0939   NITRITE NEGATIVE 12/25/2018 1348   LEUKOCYTESUR NEGATIVE 12/25/2018 1348   Sepsis Labs: Invalid input(s): PROCALCITONIN, LACTICIDVEN  Recent Results (from the past 240 hour(s))  MRSA PCR Screening     Status: None   Collection Time: 02/17/19  7:48 PM  Result Value Ref Range Status   MRSA by PCR NEGATIVE NEGATIVE Final    Comment:        The GeneXpert MRSA Assay (FDA approved for NASAL specimens only), is one component of a comprehensive MRSA colonization surveillance program. It is not intended to diagnose MRSA infection nor to guide or monitor treatment for MRSA infections. Performed at [facility], [place] [address], [place], [place] [address]   Surgical pcr screen     Status: None   Collection Time: 02/18/19  9:37 AM  Result Value Ref Range Status   MRSA, PCR NEGATIVE NEGATIVE Final   Staphylococcus aureus NEGATIVE NEGATIVE Final    Comment: (NOTE) The Xpert SA Assay (FDA approved for NASAL specimens in patients 69 years of age and older), is one component of a comprehensive surveillance program. It is not intended to diagnose infection nor to guide or monitor treatment. Performed at [facility], [place] [address] [place], [place] [address]       Radiology Studies: Chest Portable 1 View  Result Date: 02/17/2019 CLINICAL DATA:  Preop for hip surgery. EXAM: PORTABLE CHEST 1 VIEW COMPARISON:  Radiographs of December 25, 2018. FINDINGS: The heart size and mediastinal contours are  within normal limits. No pneumothorax or pleural effusion is noted. No acute pulmonary disease is noted. Postsurgical changes are noted in right upper lobe. The visualized skeletal structures are unremarkable. IMPRESSION: No acute cardiopulmonary abnormality seen. Electronically Signed   By: Marijo Conception M.D.   On: 02/17/2019 16:16   Dg Hip Port Unilat With Pelvis 1v Right  Result Date: 02/18/2019 CLINICAL DATA:  Post RIGHT hip arthroplasty EXAM: DG HIP (WITH OR WITHOUT PELVIS) 1V PORT RIGHT COMPARISON:  12/25/2018 FINDINGS: Osseous demineralization. New components of a RIGHT hip prosthesis are identified. No acute fracture, dislocation, bone destruction. IMPRESSION: RIGHT hip prosthesis without acute abnormality. Electronically Signed   By: Lavonia Dana M.D.   On: 02/18/2019 18:33  Marzetta Board, MD, PhD [facility]  Contact via  [email or web address]  [place] P: [phone]  F: [phone]

## 2019-02-19 NOTE — Evaluation (Addendum)
Occupational Therapy Evaluation Patient Details Name: [name] MRN: [id number] DOB: 09-28-1951 Today's Date: 02/19/2019    History of Present Illness Dwight R Manzi is a 68 y.o. female s/p turning in kitchen and causing R hip fx requiring R THA with posterior hip precautions with medical history significant of lung cancer, kidney cancer status post partial nephrectomy CHF, CKD, fibromyalgia, history of ovarian cancer, hypothyroidism.    Clinical Impression   Pt PTA: living with spouse and independent with ADL and mobility prior. Pt currently, minA for transfers and ADL functional mobility with RW as pt requires cues for proper technique for gait. Pt ambulatory to bathroom and participated with toilet hygiene modA overall. UB ADL set-upA in sitting and minguardA standing at sink for grooming. Pt unable  To stand >2 mins with poor balance and reliance on RW for stability. Pt continues to progress. Education performed on posterior hip precautions. Pt requires continued OT to focus on ADL and mobility, teach AE. OT to follow acutely.   O2 on RA >90%      Follow Up Recommendations  Home health OT;Supervision/Assistance - 24 hour    Equipment Recommendations  3 in 1 bedside commode    Recommendations for Other Services       Precautions / Restrictions Precautions Precautions: Posterior Hip;Fall Restrictions Weight Bearing Restrictions: Yes RLE Weight Bearing: Weight bearing as tolerated      Mobility Bed Mobility Overal bed mobility: Needs Assistance Bed Mobility: Rolling;Sidelying to Sit Rolling: Mod assist Sidelying to sit: Mod assist;HOB elevated       General bed mobility comments: modA for RLE and trunk elevation  Transfers Overall transfer level: Needs assistance Equipment used: Rolling walker (2 wheeled) Transfers: Sit to/from [facility] Sit to Stand: Min assist;From elevated surface Stand pivot transfers: Min guard            Balance  Overall balance assessment: Needs assistance   Sitting balance-Leahy Scale: Fair       Standing balance-Leahy Scale: Poor Standing balance comment: reliant on RW and minguardA for balance                           ADL either performed or assessed with clinical judgement   ADL Overall ADL's : Needs assistance/impaired Eating/Feeding: Modified independent;Sitting   Grooming: Set up;Sitting   Upper Body Bathing: Set up;Sitting   Lower Body Bathing: Moderate assistance;Cueing for safety;Cueing for sequencing;With adaptive equipment;Sitting/lateral leans;Sit to/from stand;Adhering to hip precautions   Upper Body Dressing : Set up;Sitting   Lower Body Dressing: Moderate assistance;With adaptive equipment;Cueing for safety;Sitting/lateral leans;Sit to/from stand   Toilet Transfer: Minimal assistance;Regular Toilet;RW;Grab bars   Toileting- Clothing Manipulation and Hygiene: Moderate assistance;+2 for safety/equipment;+2 for physical assistance;Sitting/lateral lean;Sit to/from stand       Functional mobility during ADLs: Minimal assistance;Rolling walker;Cueing for safety General ADL Comments: set-upAfor UB ADL and modA for LB ADL     Vision Baseline Vision/History: No visual deficits Vision Assessment?: No apparent visual deficits     Perception     Praxis      Pertinent Vitals/Pain Pain Assessment: 0-10 Pain Score: 6  Pain Location: r hip Pain Descriptors / Indicators: Discomfort;Tightness Pain Intervention(s): Monitored during session     Hand Dominance Right   Extremity/Trunk Assessment Upper Extremity Assessment Upper Extremity Assessment: Generalized weakness   Lower Extremity Assessment Lower Extremity Assessment: Generalized weakness   Cervical / Trunk Assessment Cervical / Trunk Assessment: Normal   Communication  Communication Communication: No difficulties   Cognition Arousal/Alertness: Awake/alert Behavior During Therapy: WFL for tasks  assessed/performed Overall Cognitive Status: Within Functional Limits for tasks assessed                                     General Comments  O2 >90% on RA .O2. Pt education provided on hip precautions and pt will be introduced to AE soon.    Exercises Exercises: Other exercises Other Exercises Other Exercises: snkle pumps x5 reps b/l   Shoulder Instructions      Home Living Family/patient expects to be discharged to:: Private residence Living Arrangements: Spouse/significant other Available Help at Discharge: Family;Available 24 hours/day Type of Home: House Home Access: Stairs to enter [employer] of Steps: 3 Entrance Stairs-Rails: Right;Left Home Layout: One level     Bathroom Shower/Tub: Occupational psychologist: Standard     Home Equipment: Cane - single point;Walker - 2 wheels          Prior Functioning/Environment Level of Independence: Independent        Comments: reports cooking, cleaning and performing own ADL and driving        OT Problem List: Decreased strength;Decreased activity tolerance;Impaired balance (sitting and/or standing);Decreased coordination;Pain;Decreased safety awareness      OT Treatment/Interventions: Self-care/ADL training;Therapeutic exercise;Energy conservation;Neuromuscular education;Therapeutic activities;Balance training;Patient/family education    OT Goals(Current goals can be found in the care plan section) Acute Rehab OT Goals Patient Stated Goal: to go home OT Goal Formulation: With patient Time For Goal Achievement: 03/07/19 Potential to Achieve Goals: Good ADL Goals Pt Will Perform Upper Body Dressing: with modified independence;standing Pt Will Perform Lower Body Dressing: with modified independence;with adaptive equipment;sit to/from stand Pt Will Transfer to Toilet: with supervision;ambulating;regular height toilet;grab bars Pt Will Perform Toileting - Clothing Manipulation and  hygiene: with set-up;sit to/from stand Pt/caregiver will Perform Home Exercise Program: Both right and left upper extremity;With written HEP provided;Independently Additional ADL Goal #1: Pt will stand x5 mins in prep for ADL with fair balance and good safety awareness Additional ADL Goal #2: Pt will state 3/3 hip precautions each session with no verbal cues  OT Frequency: Min 3X/week   Barriers to D/C:            Co-evaluation PT/OT/SLP Co-Evaluation/Treatment: Yes Reason for Co-Treatment: Complexity of the patient's impairments (multi-system involvement);For patient/therapist safety PT goals addressed during session: Mobility/safety with mobility;Proper use of DME OT goals addressed during session: ADL's and self-care      AM-PAC OT "6 Clicks" Daily Activity     Outcome Measure Help from another person eating meals?: None Help from another person taking care of personal grooming?: None Help from another person toileting, which includes using toliet, bedpan, or urinal?: A Little Help from another person bathing (including washing, rinsing, drying)?: A Lot Help from another person to put on and taking off regular upper body clothing?: A Little Help from another person to put on and taking off regular lower body clothing?: A Lot 6 Click Score: 18   End of Session Equipment Utilized During Treatment: Gait belt;Rolling walker Nurse Communication: Mobility status  Activity Tolerance: Patient limited by pain;Patient tolerated treatment well Patient left: in chair;with call bell/phone within reach;with chair alarm set  OT Visit Diagnosis: Unsteadiness on feet (R26.81);Muscle weakness (generalized) (M62.81);Pain Pain - Right/Left: Right Pain - part of body: Hip  Time: 0156-1537 OT Time Calculation (min): 39 min Charges:  OT General Charges $OT Visit: 1 Visit OT Evaluation $OT Eval Moderate Complexity: 1 Mod OT Treatments $Self Care/Home Management : 8-22  mins  Ebony Hail Harold Hedge) Marsa Aris OTR/L Acute Rehabilitation Services Pager: [phone] Office: [place] 02/19/2019, 3:35 PM

## 2019-02-20 LAB — BASIC METABOLIC PANEL
Anion gap: 9 (ref 5–15)
BUN: 27 mg/dL — ABNORMAL HIGH (ref 8–23)
CO2: 20 mmol/L — ABNORMAL LOW (ref 22–32)
Calcium: 8.6 mg/dL — ABNORMAL LOW (ref 8.9–10.3)
Chloride: 107 mmol/L (ref 98–111)
Creatinine, Ser: 1.1 mg/dL — ABNORMAL HIGH (ref 0.44–1.00)
GFR calc Af Amer: 59 mL/min — ABNORMAL LOW (ref >60)
GFR calc non Af Amer: 51 mL/min — ABNORMAL LOW (ref >60)
Glucose, Bld: 99 mg/dL (ref 70–99)
Potassium: 4.8 mmol/L (ref 3.5–5.1)
Sodium: 136 mmol/L (ref 135–145)

## 2019-02-20 LAB — CBC
HCT: 24.2 % — ABNORMAL LOW (ref 36.0–46.0)
Hemoglobin: 7.8 g/dL — ABNORMAL LOW (ref 12.0–15.0)
MCH: 30 pg (ref 26.0–34.0)
MCHC: 32.2 g/dL (ref 30.0–36.0)
MCV: 93.1 fL (ref 80.0–100.0)
Platelets: 186 10*3/uL (ref 150–400)
RBC: 2.6 MIL/uL — ABNORMAL LOW (ref 3.87–5.11)
RDW: 15.4 % (ref 11.5–15.5)
WBC: 11.2 10*3/uL — ABNORMAL HIGH (ref 4.0–10.5)
nRBC: 0 % (ref 0.0–0.2)

## 2019-02-20 MED ORDER — APIXABAN 2.5 MG PO TABS: 2.5000 mg | ORAL_TABLET | Freq: Two times a day (BID) | ORAL | 0 refills | Status: DC

## 2019-02-20 MED ORDER — APIXABAN 2.5 MG PO TABS
2.5000 mg | ORAL_TABLET | Freq: Two times a day (BID) | ORAL | Status: DC
  Administered 2019-02-20 – 2019-02-21 (×3): 2.5 mg via ORAL
  Filled 2019-02-20 (×3): qty 1

## 2019-02-20 MED FILL — ELIQUIS 2.5 MG TABLET: 2.5 | 30 days supply | Qty: 30 | Fill #0

## 2019-02-20 NOTE — Care Management Important Message (Signed)
Important Message  Patient Details  Name: [name] MRN: [id number] Date of Birth: Sep 18, 1951   Medicare Important Message Given:  Yes    [name] [name] 02/20/2019, 2:11 PM

## 2019-02-20 NOTE — Progress Notes (Signed)
02/20/19 1426  PT Visit Information  Last PT Received On 02/20/19  Assistance Needed +1  History of Present Illness [name] is a 68 y.o. female that was turning in kitchen and caused R hip fx requiring R THA with posterior hip precautions. PMH significant oflung cancer, kidney cancer status post partial nephrectomy CHF, CKD, fibromyalgia, history of ovarian cancer, hypothyroidism.   Subjective Data  Patient Stated Goal to go home  Precautions  Precautions Posterior Hip;Fall  Precaution Booklet Issued Yes (comment)  Precaution Comments Reviewed posterior precautions.   Restrictions  Weight Bearing Restrictions Yes  RLE Weight Bearing WBAT  Pain Assessment  Pain Assessment 0-10  Pain Score 7  Pain Location R hip  Pain Descriptors / Indicators Discomfort;Tightness;Sore  Pain Intervention(s) Limited activity within patient's tolerance;Monitored during session;RN gave pain meds during session;Repositioned  Cognition  Arousal/Alertness Awake/alert  Behavior During Therapy [facility] for tasks assessed/performed  Overall Cognitive Status Within Functional Limits for tasks assessed  Bed Mobility  Overal bed mobility Needs Assistance  Bed Mobility Supine to Sit;Sit to Supine  Supine to sit Min assist  Sit to supine Min assist  General bed mobility comments Min A for RLE assist and trunk elevation to come to sitting. increased time required to come to EOB. Min A for RLE assist to return to supine.   Transfers  Overall transfer level Needs assistance  Equipment used Rolling walker (2 wheeled)  Transfers Sit to/from Stand  Sit to Stand Supervision  General transfer comment Supervision for safety. Demonstrated safe hand placement.   Ambulation/Gait  Ambulation/Gait assistance Min guard  Gait Distance (Feet) 50 Feet  Assistive device Rolling walker (2 wheeled)  Gait Pattern/deviations Step-to pattern;Decreased step length - right;Decreased step length - left;Decreased weight shift to  right;Antalgic  General Gait Details Able to increase gait distance, however, continues to be limited by pain. Pt walking with toe out gait on the R, requiring cues to straighten R foot. Step to pattern secondary to pain and decreased weightshift to RLE.   Gait velocity Decreased   Balance  Overall balance assessment Needs assistance  Sitting-balance support No upper extremity supported;Feet supported  Sitting balance-Leahy Scale Fair  Standing balance support Bilateral upper extremity supported;During functional activity  Standing balance-Leahy Scale Poor  Standing balance comment reliant on RW for support   Exercises  Exercises Total Joint  Total Joint Exercises  Ankle Circles/Pumps AROM;Both;20 reps;Supine  Quad Sets AROM;Right;5 reps;Supine  PT - End of Session  Equipment Utilized During Treatment Gait belt  Activity Tolerance Patient limited by pain  Patient left in bed;with call bell/phone within reach  Nurse Communication Mobility status   PT - Assessment/Plan  PT Plan Current plan remains appropriate  PT Visit Diagnosis Unsteadiness on feet (R26.81);Muscle weakness (generalized) (M62.81);Difficulty in walking, not elsewhere classified (R26.2);Pain  Pain - Right/Left Right  Pain - part of body Hip  PT Frequency (ACUTE ONLY) Min 5X/week  Follow Up Recommendations Home health PT;Supervision for mobility/OOB  PT equipment Rolling walker with 5" wheels;3in1 (PT)  AM-PAC PT "6 Clicks" Mobility Outcome Measure (Version 2)  Help needed turning from your back to your side while in a flat bed without using bedrails? 3  Help needed moving from lying on your back to sitting on the side of a flat bed without using bedrails? 3  Help needed moving to and from a bed to a chair (including a wheelchair)? 3  Help needed standing up from a chair using your arms (e.g., wheelchair or  bedside chair)? 3  Help needed to walk in hospital room? 3  Help needed climbing 3-5 steps with a railing?  2  6  Click Score 17  Consider Recommendation of Discharge To: Home with [facility]  PT Goal Progression  Progress towards PT goals Progressing toward goals  Acute Rehab PT Goals  PT Goal Formulation With patient  Time For Goal Achievement 03/05/19  Potential to Achieve Goals Good  PT Time Calculation  PT Start Time (ACUTE ONLY) 1400  PT Stop Time (ACUTE ONLY) 1415  PT Time Calculation (min) (ACUTE ONLY) 15 min  PT General Charges  $$ ACUTE PT VISIT 1 Visit  PT Treatments  $Gait Training 8-22 mins   Pt progressing towards goals. Able to increase ambulation distance this session, however, continues to be limited by pain. Min guard to supervision for mobility tasks using RW this session. Reviewed supine HEP with pt. Current recommendations appropriate as pt very adamant about returning home. Will continue to follow acutely to maximize functional mobility independence and safety.   Leighton Ruff, PT, DPT  Acute Rehabilitation Services  Pager: [phone] Office: [phone]

## 2019-02-20 NOTE — Progress Notes (Signed)
PROGRESS NOTE  [name] XKG:[id number] DOB: 1950-10-26 DOA: 02/17/2019 PCP: Bernerd Limbo, MD   LOS: 3 days   Brief Narrative / Interim history: 68 y.o.femalewith medical history significant oflung cancer, kidney cancer status post partial nephrectomyCHF, CKDstage III, chronic pain, fibromyalgia, history of ovarian cancer, and hypothyroidism, who presentedwith complaints of right hip pain l found to have a displaced right femoral neck fracture in Dr. Mardelle Matte office.Last reports following on the affected side sometime in February, but had imaging studies done that did not note any signs of a fracture. Last night while standing in the kitchen she reports turning and feeling like her right hip gave way. Thereafter, she was unable to bear any significant amount of weight on the affected side.Complains of significant pain and muscle spasms in the left side. Other associated symptoms include fatigue and weight loss. She was evaluated by Dr. Mardelle Matte for symptoms and had x-ray imaging revealing a acute displaced right femoral neckfracture. Dr. Mardelle Matte call and requested direct admission. She has been being evaluated for colitis by gastroenterology. 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 At this time she reports being evaluated for need of resection ofthatpart of her colon. Reports that diarrhea symptoms resolved approximately 3 days ago and she is not noted any further bleeding. Denies complaints of shortness of breath, chest pain, cough, nausea, vomiting, ordysuria. Furthermore, patient was scheduled to have nuclear stress testing on 4/14, but it was postponed due to COVID-58for further evaluation ofintermittent complaints of shortness of breath.  Subjective: Complains of soreness at the right hip, no chest pain, no shortness of  breath.  No abdominal pain, no nausea or vomiting  Assessment & Plan: Principal Problem:   Closed displaced fracture of right femoral neck (HCC) Active Problems:   Hypothyroidism   Hx of ovarian cancer   Chronic pain syndrome   Generalized anxiety disorder   Weight loss   Leukocytosis   Normocytic normochromic anemia   Essential hypertension   Principal Problem Displaced right femoral neck fracture -This is felt to be pathologic, given significant oncologic history, she is status post right total hip arthroplasty by Dr. Mardelle Matte on 02/18/2019 -PT evaluation pending  Active Problems Normocytic normochromic anemia -Patient has a history of colitis/bleeding, required blood transfusion last month.  Hemoglobin this admission was 7.1.  She was found to have a stricture in the transverse colon earlier this month and underwent colonoscopy with [facility], Dr. Delle Reining earlier this month.  I do not have access to the colonoscopy report.  Question whether she could tolerate DVT prophylaxis in forms of anticoagulation for #1.  -I have discussed with gastroenterology MD on call covering for her primary regarding colonoscopy findings and anticoagulation.  While there are no clear reports of her having GI bleed, GI MD told me that the mucosa was friable but is difficult to weigh in as the risk for bleeding. -Discussed with patient at bedside this morning regarding increased risk of DVT following hip fracture along with her prior oncologic history.  Also discussed that blood thinners will provide some protection against DVT however may predispose her to have GI bleed.  After evaluating risks/benefits, patient prefers anticoagulation but she never recalls a GI bleed since age of 85.  Will start low-dose Eliquis 2.5 twice daily which are probably need to be maintained for 4 weeks  Chronic kidney disease stage  III -Creatinine on admission 1.32, her baseline creatinine 1.3-1.5, creatinine remains at  baseline  Hypertension -Continue home medications, blood pressure stable  Chronic pain syndrome -Continue home medications  Hypothyroidism -Continue Synthroid  Anxiety/depression -Continue BuSpar and Wellbutrin  History of multiple cancers -Including ovarian, renal, lung cancer and skin cancer  Colonic stricture -See discussion above  History of sarcoidosis -Stable  Scheduled Meds:   amLODipine  10 mg Oral Daily   apixaban  2.5 mg Oral BID   buPROPion  150 mg Oral Daily   busPIRone  15 mg Oral BID   docusate sodium  100 mg Oral BID   ferrous sulfate  325 mg Oral TID PC   levothyroxine  100 mcg Oral Daily   lisinopril  10 mg Oral Daily   morphine  30 mg Oral Q12H   pantoprazole  40 mg Oral Daily   zolpidem  10 mg Oral QHS   Continuous Infusions:  PRN Meds:.acetaminophen, alum & mag hydroxide-simeth, bisacodyl, cyclobenzaprine, diclofenac sodium, diphenoxylate-atropine, HYDROcodone-acetaminophen, HYDROcodone-acetaminophen, magnesium citrate, menthol-cetylpyridinium **OR** phenol, methocarbamol, metoCLOPramide **OR** metoCLOPramide (REGLAN) injection, morphine injection, ondansetron **OR** ondansetron (ZOFRAN) IV, polyethylene glycol  DVT prophylaxis: Low-dose Eliquis Code Status: Full code Family Communication: no family at bedside  Disposition Plan: TBD  Consultants:   Orthopedic surgery, Dr. Mardelle Matte   Procedures:   THA 4/28  Antimicrobials:  None    Objective: Vitals:   02/18/19 2302 02/19/19 0547 02/19/19 2332 02/20/19 0458  BP: (!) 111/54 (!) 108/50 136/64 135/63  Pulse: 79 80 (!) 107 (!) 108  Resp: 18   16  Temp: 98.4 F (36.9 C) 98.3 F (36.8 C) 98.9 F (37.2 C) 98.8 F (37.1 C)  TempSrc: Oral Oral Oral Oral  SpO2: 98% 98% 98% 98%  Weight:      Height:        Intake/Output Summary (Last 24 hours) at 02/20/2019 1202 Last data filed at 02/20/2019 1034 Gross per 24 hour  Intake 1034.88 ml  Output 750 ml  Net 284.88 ml   Filed  Weights   02/17/19 1355  Weight: 63.6 kg    Examination:  Constitutional: No distress Eyes: No scleral icterus ENMT: Moist mucous membranes Respiratory: No wheezing, no crackles, normal respiratory effort, moves air well Cardiovascular: Regular rate and rhythm, no murmurs appreciated.  No peripheral edema Abdomen: Soft, nontender, nondistended, bowel sounds positive Musculoskeletal: no clubbing / cyanosis.  Skin: No new rashes Neurologic: No focal deficits Psychiatric: Normal judgment and insight. Alert and oriented x 3. Normal mood.    Data Reviewed: I have independently reviewed following labs and imaging studies   CBC: Recent Labs  Lab 02/17/19 1447 02/18/19 0255 02/18/19 1639 02/19/19 0329 02/20/19 0226  WBC 11.8* 10.0  --  10.5 11.2*  NEUTROABS 8.7*  --   --   --   --   HGB 7.1* 8.4* 6.8* 8.0* 7.8*  HCT 22.7* 26.4* 20.0* 24.7* 24.2*  MCV 95.0 95.0  --  92.9 93.1  PLT 225 213  --  188 161   Basic Metabolic Panel: Recent Labs  Lab 02/17/19 1447 02/18/19 0255 02/18/19 1639 02/19/19 0329 02/20/19 0226  NA 141 138 135 138 136  K 3.9 4.3 3.8 4.7 4.8  CL 105 107  --  108 107  CO2 26 22  --  22 20*  GLUCOSE 126* 104* 118* 153* 99  BUN 38* 35*  --  27* 27*  CREATININE 1.32* 1.26*  --  1.03* 1.10*  CALCIUM 8.8* 8.7*  --  8.3* 8.6*  MG  --  1.2*  --   --   --    GFR: Estimated Creatinine Clearance: 43.4 mL/min (A) (by C-G formula based on SCr of 1.1 mg/dL (H)). Liver Function Tests: Recent Labs  Lab 02/17/19 1447  AST 16  ALT 19  ALKPHOS 58  BILITOT 0.7  PROT 5.7*  ALBUMIN 2.5*   No results for input(s): LIPASE, AMYLASE in the last 168 hours. No results for input(s): AMMONIA in the last 168 hours. Coagulation Profile: No results for input(s): INR, PROTIME in the last 168 hours. Cardiac Enzymes: No results for input(s): CKTOTAL, CKMB, CKMBINDEX, TROPONINI in the last 168 hours. BNP (last 3 results) No results for input(s): PROBNP in the last 8760  hours. HbA1C: No results for input(s): HGBA1C in the last 72 hours. CBG: No results for input(s): GLUCAP in the last 168 hours. Lipid Profile: No results for input(s): CHOL, HDL, LDLCALC, TRIG, CHOLHDL, LDLDIRECT in the last 72 hours. Thyroid Function Tests: No results for input(s): TSH, T4TOTAL, FREET4, T3FREE, THYROIDAB in the last 72 hours. Anemia Panel: No results for input(s): VITAMINB12, FOLATE, FERRITIN, TIBC, IRON, RETICCTPCT in the last 72 hours. Urine analysis:    Component Value Date/Time   COLORURINE YELLOW 12/25/2018 1348   APPEARANCEUR CLEAR 12/25/2018 1348   LABSPEC 1.012 12/25/2018 1348   PHURINE 5.0 12/25/2018 1348   GLUCOSEU NEGATIVE 12/25/2018 1348   HGBUR NEGATIVE 12/25/2018 [place] 12/25/2018 1348   KETONESUR NEGATIVE 12/25/2018 1348   PROTEINUR NEGATIVE 12/25/2018 1348   UROBILINOGEN 1.0 07/29/2015 0939   NITRITE NEGATIVE 12/25/2018 1348   LEUKOCYTESUR NEGATIVE 12/25/2018 1348   Sepsis Labs: Invalid input(s): PROCALCITONIN, LACTICIDVEN  Recent Results (from the past 240 hour(s))  MRSA PCR Screening     Status: None   Collection Time: 02/17/19  7:48 PM  Result Value Ref Range Status   MRSA by PCR NEGATIVE NEGATIVE Final    Comment:        The GeneXpert MRSA Assay (FDA approved for NASAL specimens only), is one component of a comprehensive MRSA colonization surveillance program. It is not intended to diagnose MRSA infection nor to guide or monitor treatment for MRSA infections. Performed at [facility], [place] [address], [place], [place] [address]   Surgical pcr screen     Status: None   Collection Time: 02/18/19  9:37 AM  Result Value Ref Range Status   MRSA, PCR NEGATIVE NEGATIVE Final   Staphylococcus aureus NEGATIVE NEGATIVE Final    Comment: (NOTE) The Xpert SA Assay (FDA approved for NASAL specimens in patients 54 years of age and older), is one component of a comprehensive surveillance program. It is not  intended to diagnose infection nor to guide or monitor treatment. Performed at [facility], [place] [address], [place],  [address]       Radiology Studies: Ct Chest W Contrast  Result Date: 02/19/2019 CLINICAL DATA:  Kidney cancer. History of ovarian and lung cancer. Recent hip fracture. EXAM: CT CHEST, ABDOMEN, AND PELVIS WITH CONTRAST TECHNIQUE: Multidetector CT imaging of the chest, abdomen and pelvis was performed following the standard protocol during bolus administration of intravenous contrast. CONTRAST:  140mL OMNIPAQUE IOHEXOL 300 MG/ML SOLN 100 cc of Omnipaque 300 COMPARISON:  Chest radiograph 02/17/2019. Abdominopelvic CT of 12/04/2018. Most recent chest CT 10/31/2018. FINDINGS: CT CHEST FINDINGS Cardiovascular: Aortic and branch vessel atherosclerosis. Tortuous thoracic aorta. Normal heart size, without pericardial effusion. Multivessel coronary artery atherosclerosis. No central pulmonary  embolism, on this non-dedicated study. Mediastinum/Nodes: No supraclavicular adenopathy. No mediastinal or hilar adenopathy. Lungs/Pleura: Trace left pleural fluid is new since the prior chest CT. Beam hardening artifact from left shoulder arthroplasty. Mild centrilobular emphysema. Posterior right upper lobe wedge resection, without locally recurrent disease. Right lower lobe dependent subsegmental atelectasis. Musculoskeletal: Mild osteopenia. Left shoulder arthroplasty. Multiple anterior nonacute right rib fractures. CT ABDOMEN PELVIS FINDINGS Hepatobiliary: Focal steatosis adjacent the falciform ligament. Mild intrahepatic biliary duct dilatation is similar. Normal gallbladder. The common duct measures maximally 12 mm in the porta hepatis on image 60/3. Compare 11 mm on the prior (when remeasured). No obstructive stone or mass identified. Pancreas: Pancreatic atrophy involves the neck and head. No acute inflammation or duct dilatation. Spleen: Normal in size, without focal abnormality.  Adrenals/Urinary Tract: Normal adrenal glands. Beam hardening artifact from lumbar spine surgery degrades evaluation of the kidneys. A lower pole left renal lesion measures 1.9 x 1.2 cm and greater than fluid density on image 71/3. Compare 1.3 x 0.9 cm on 08/28/2017 (when remeasured). Bilateral too small to characterize renal lesions. No hydronephrosis. Air within the nondependent urinary bladder. Stomach/Bowel: Gastric antral underdistention. Colonic stool burden suggests constipation. Large amount of stool in the rectum. Possible residual mild transverse colonic wall thickening on image 89/3. Normal terminal ileum. Vascular/Lymphatic: Aortic and branch vessel atherosclerosis. No abdominopelvic adenopathy. Reproductive: Hysterectomy.  No adnexal mass. Other: No significant free fluid.  No free intraperitoneal air. Musculoskeletal: Anasarca. Presumably postoperative air about the right hip. Osteopenia. Right hip arthroplasty. L3-S1 trans pedicle screw fixation. Lateral L2-3 left-sided fixation. Mild L1 compression deformity with vertebral augmentation. IMPRESSION: CT CHEST IMPRESSION 1. Status post right upper lobe wedge resection, without locally recurrent or metastatic disease. 2. Coronary artery atherosclerosis. Aortic Atherosclerosis (ICD10-I70.0). 3. Trace left pleural fluid. CT ABDOMEN AND PELVIS IMPRESSION 1. A left-sided renal lesion is suboptimally evaluated secondary to technique related factors, including adjacent beam hardening artifact. This has enlarged minimally since the 08/28/2017 MRI, where it was felt to be of consistent with a Bosniak 2 lesion. Repeat pre and post contrast abdominal MRI could be performed as an outpatient. 2.  Possible constipation versus fecal impaction. 3. Chronic biliary duct dilatation back to 2018, likely within normal variation. This could be correlated with bilirubin level. 4. Air within the urinary bladder.  Correlate with instrumentation. 5. Improved appearance of the  colon compared to 12/04/2018. Cannot exclude residual transverse colitis. Electronically Signed   By: Abigail Miyamoto M.D.   On: 02/19/2019 18:05   Ct Abdomen Pelvis W Contrast  Result Date: 02/19/2019 CLINICAL DATA:  Kidney cancer. History of ovarian and lung cancer. Recent hip fracture. EXAM: CT CHEST, ABDOMEN, AND PELVIS WITH CONTRAST TECHNIQUE: Multidetector CT imaging of the chest, abdomen and pelvis was performed following the standard protocol during bolus administration of intravenous contrast. CONTRAST:  138mL OMNIPAQUE IOHEXOL 300 MG/ML SOLN 100 cc of Omnipaque 300 COMPARISON:  Chest radiograph 02/17/2019. Abdominopelvic CT of 12/04/2018. Most recent chest CT 10/31/2018. FINDINGS: CT CHEST FINDINGS Cardiovascular: Aortic and branch vessel atherosclerosis. Tortuous thoracic aorta. Normal heart size, without pericardial effusion. Multivessel coronary artery atherosclerosis. No central pulmonary embolism, on this non-dedicated study. Mediastinum/Nodes: No supraclavicular adenopathy. No mediastinal or hilar adenopathy. Lungs/Pleura: Trace left pleural fluid is new since the prior chest CT. Beam hardening artifact from left shoulder arthroplasty. Mild centrilobular emphysema. Posterior right upper lobe wedge resection, without locally recurrent disease. Right lower lobe dependent subsegmental atelectasis. Musculoskeletal: Mild osteopenia. Left shoulder arthroplasty. Multiple anterior nonacute right rib  fractures. CT ABDOMEN PELVIS FINDINGS Hepatobiliary: Focal steatosis adjacent the falciform ligament. Mild intrahepatic biliary duct dilatation is similar. Normal gallbladder. The common duct measures maximally 12 mm in the porta hepatis on image 60/3. Compare 11 mm on the prior (when remeasured). No obstructive stone or mass identified. Pancreas: Pancreatic atrophy involves the neck and head. No acute inflammation or duct dilatation. Spleen: Normal in size, without focal abnormality. Adrenals/Urinary Tract:  Normal adrenal glands. Beam hardening artifact from lumbar spine surgery degrades evaluation of the kidneys. A lower pole left renal lesion measures 1.9 x 1.2 cm and greater than fluid density on image 71/3. Compare 1.3 x 0.9 cm on 08/28/2017 (when remeasured). Bilateral too small to characterize renal lesions. No hydronephrosis. Air within the nondependent urinary bladder. Stomach/Bowel: Gastric antral underdistention. Colonic stool burden suggests constipation. Large amount of stool in the rectum. Possible residual mild transverse colonic wall thickening on image 89/3. Normal terminal ileum. Vascular/Lymphatic: Aortic and branch vessel atherosclerosis. No abdominopelvic adenopathy. Reproductive: Hysterectomy.  No adnexal mass. Other: No significant free fluid.  No free intraperitoneal air. Musculoskeletal: Anasarca. Presumably postoperative air about the right hip. Osteopenia. Right hip arthroplasty. L3-S1 trans pedicle screw fixation. Lateral L2-3 left-sided fixation. Mild L1 compression deformity with vertebral augmentation. IMPRESSION: CT CHEST IMPRESSION 1. Status post right upper lobe wedge resection, without locally recurrent or metastatic disease. 2. Coronary artery atherosclerosis. Aortic Atherosclerosis (ICD10-I70.0). 3. Trace left pleural fluid. CT ABDOMEN AND PELVIS IMPRESSION 1. A left-sided renal lesion is suboptimally evaluated secondary to technique related factors, including adjacent beam hardening artifact. This has enlarged minimally since the 08/28/2017 MRI, where it was felt to be of consistent with a Bosniak 2 lesion. Repeat pre and post contrast abdominal MRI could be performed as an outpatient. 2.  Possible constipation versus fecal impaction. 3. Chronic biliary duct dilatation back to 2018, likely within normal variation. This could be correlated with bilirubin level. 4. Air within the urinary bladder.  Correlate with instrumentation. 5. Improved appearance of the colon compared to  12/04/2018. Cannot exclude residual transverse colitis. Electronically Signed   By: Abigail Miyamoto M.D.   On: 02/19/2019 18:05   Dg Hip Port Unilat With Pelvis 1v Right  Result Date: 02/18/2019 CLINICAL DATA:  Post RIGHT hip arthroplasty EXAM: DG HIP (WITH OR WITHOUT PELVIS) 1V PORT RIGHT COMPARISON:  12/25/2018 FINDINGS: Osseous demineralization. New components of a RIGHT hip prosthesis are identified. No acute fracture, dislocation, bone destruction. IMPRESSION: RIGHT hip prosthesis without acute abnormality. Electronically Signed   By: Lavonia Dana M.D.   On: 02/18/2019 18:33     Marzetta Board, MD, PhD [facility]  Contact via  [email or web address]  [place] P: [phone]  F: [phone]

## 2019-02-20 NOTE — Progress Notes (Signed)
Physical Therapy Treatment Patient Details Name: [name] MRN: [id number] DOB: 01-14-1951 Today's Date: 02/20/2019    History of Present Illness [name] is a 68 y.o. female that was turning in kitchen and caused R hip fx requiring R THA with posterior hip precautions. PMH significant oflung cancer, kidney cancer status post partial nephrectomy CHF, CKD, fibromyalgia, history of ovarian cancer, hypothyroidism.     PT Comments    Pt with slow progression towards goals. Limited secondary to pain this session. Required min guard A with RW to perform gait within the room. Pt wanting to go home at d/c, however, discussed possible post acute rehab if pt does not progress as expected. Pt continues to want to d/c home at this time. Will continue to follow acutely to maximize functional mobility independence and safety.    Follow Up Recommendations  Home health PT;Supervision for mobility/OOB(pending progression )     Equipment Recommendations  Rolling walker with 5" wheels;3in1 (PT)    Recommendations for Other Services       Precautions / Restrictions Precautions Precautions: Posterior Hip;Fall Precaution Booklet Issued: Yes (comment) Precaution Comments: Reviewed posterior precautions. Required cues throughout session not to cross LEs.  Restrictions Weight Bearing Restrictions: Yes RLE Weight Bearing: Weight bearing as tolerated    Mobility  Bed Mobility Overal bed mobility: Needs Assistance Bed Mobility: Supine to Sit;Sit to Supine     Supine to sit: Min assist Sit to supine: Min assist   General bed mobility comments: Min A for RLE assist and trunk elevation to come to sitting. increased time required to come to EOB. Min A for RLE assist to return to supine.   Transfers Overall transfer level: Needs assistance Equipment used: Rolling walker (2 wheeled) Transfers: Sit to/from Stand Sit to Stand: Supervision         General transfer comment: Supervision for  safety. Demonstrated safe hand placement.   Ambulation/Gait Ambulation/Gait assistance: Min guard Gait Distance (Feet): 20 Feet Assistive device: Rolling walker (2 wheeled) Gait Pattern/deviations: Step-to pattern;Decreased step length - right;Decreased step length - left;Decreased weight shift to right;Antalgic Gait velocity: Decreased    General Gait Details: Slow, antalgic gait. Distance limited secondary to pain. Cues for sequencing using RW.    Stairs             Wheelchair Mobility    Modified Rankin (Stroke Patients Only)       Balance Overall balance assessment: Needs assistance Sitting-balance support: No upper extremity supported;Feet supported Sitting balance-Leahy Scale: Fair     Standing balance support: Bilateral upper extremity supported;During functional activity Standing balance-Leahy Scale: Poor Standing balance comment: reliant on RW for support                             Cognition Arousal/Alertness: Awake/alert Behavior During Therapy: WFL for tasks assessed/performed Overall Cognitive Status: Within Functional Limits for tasks assessed                                        Exercises Total Joint Exercises Long Arc Quad: AROM;Right;5 reps;Seated    General Comments General comments (skin integrity, edema, etc.): Pt tearful during session, as she felt like this surgery was harder to recover from than past surgeries.       Pertinent Vitals/Pain Pain Assessment: 0-10 Pain Score: 7  Pain Location: R hip  Pain Descriptors / Indicators: Discomfort;Tightness;Sore Pain Intervention(s): Limited activity within patient's tolerance;Monitored during session;Repositioned    Home Living                      Prior Function            PT Goals (current goals can now be found in the care plan section) Acute Rehab PT Goals Patient Stated Goal: to go home PT Goal Formulation: With patient Time For Goal  Achievement: 03/05/19 Potential to Achieve Goals: Good Progress towards PT goals: Progressing toward goals    Frequency    Min 5X/week      PT Plan Current plan remains appropriate    Co-evaluation              AM-PAC PT "6 Clicks" Mobility   Outcome Measure  Help needed turning from your back to your side while in a flat bed without using bedrails?: A Little Help needed moving from lying on your back to sitting on the side of a flat bed without using bedrails?: A Little Help needed moving to and from a bed to a chair (including a wheelchair)?: A Little Help needed standing up from a chair using your arms (e.g., wheelchair or bedside chair)?: A Little Help needed to walk in hospital room?: A Little Help needed climbing 3-5 steps with a railing? : A Lot 6 Click Score: 17    End of Session Equipment Utilized During Treatment: Gait belt Activity Tolerance: Patient limited by pain Patient left: in bed;with call bell/phone within reach;with bed alarm set Nurse Communication: Mobility status PT Visit Diagnosis: Unsteadiness on feet (R26.81);Muscle weakness (generalized) (M62.81);Difficulty in walking, not elsewhere classified (R26.2);Pain Pain - Right/Left: Right Pain - part of body: Hip     Time: 0518-3358 PT Time Calculation (min) (ACUTE ONLY): 16 min  Charges:  $Gait Training: 8-22 mins                     Leighton Ruff, PT, DPT  Acute Rehabilitation Services  Pager: [phone] Office: [phone]    Rudean Hitt 02/20/2019, 12:57 PM

## 2019-02-21 ENCOUNTER — Encounter (HOSPITAL_COMMUNITY): Payer: Self-pay | Admitting: Orthopedic Surgery

## 2019-02-21 DIAGNOSIS — W19XXXD Unspecified fall, subsequent encounter: Secondary | ICD-10-CM

## 2019-02-21 LAB — BASIC METABOLIC PANEL
Anion gap: 8 (ref 5–15)
BUN: 22 mg/dL (ref 8–23)
CO2: 24 mmol/L (ref 22–32)
Calcium: 8.5 mg/dL — ABNORMAL LOW (ref 8.9–10.3)
Chloride: 104 mmol/L (ref 98–111)
Creatinine, Ser: 1.01 mg/dL — ABNORMAL HIGH (ref 0.44–1.00)
GFR calc Af Amer: >60 mL/min (ref >60)
GFR calc non Af Amer: 57 mL/min — ABNORMAL LOW (ref >60)
Glucose, Bld: 119 mg/dL — ABNORMAL HIGH (ref 70–99)
Potassium: 4.2 mmol/L (ref 3.5–5.1)
Sodium: 136 mmol/L (ref 135–145)

## 2019-02-21 LAB — CBC
HCT: 23.3 % — ABNORMAL LOW (ref 36.0–46.0)
Hemoglobin: 7.5 g/dL — ABNORMAL LOW (ref 12.0–15.0)
MCH: 29.5 pg (ref 26.0–34.0)
MCHC: 32.2 g/dL (ref 30.0–36.0)
MCV: 91.7 fL (ref 80.0–100.0)
Platelets: 174 10*3/uL (ref 150–400)
RBC: 2.54 MIL/uL — ABNORMAL LOW (ref 3.87–5.11)
RDW: 14.7 % (ref 11.5–15.5)
WBC: 9.6 10*3/uL (ref 4.0–10.5)
nRBC: 0 % (ref 0.0–0.2)

## 2019-02-21 LAB — PREPARE RBC (CROSSMATCH)

## 2019-02-21 MED ORDER — SODIUM CHLORIDE 0.9% IV SOLUTION
Freq: Once | INTRAVENOUS | Status: AC
  Administered 2019-02-21: 09:00:00 via INTRAVENOUS

## 2019-02-21 MED ORDER — OXYCODONE HCL 5 MG PO TABS: 5.0000 mg | ORAL_TABLET | ORAL | 0 refills | Status: AC | PRN

## 2019-02-21 NOTE — TOC Initial Note (Signed)
Transition of Care [facility]) - Initial/Assessment Note    Patient Details  Name: [name] MRN: [id number] Date of Birth: [birth year]  Transition of Care [facility]) CM/SW Contact:    Bartholomew Crews, RN Phone Number: [phone] 02/21/2019, 12:24 PM  Clinical Narrative:                 Spoke with patient at the bedside. Discussed [place] services and needed DME. Choice offered. Referral made to [facility] for PT/OT/Aide. Referral made to [facility] for RW, 3N1. Patient stated that she is from home with her husband - daughter is supportive. She has transportation home. No problems obtaining medications. Eliquis filled by [facility] pharmacy yesterday - patient provided with free copay card. Patient to transition home today. No other transition of care needs identified at this time.   Expected Discharge Plan: [place] Barriers to Discharge: No Barriers Identified   Patient Goals and CMS Choice   CMS Medicare.gov Compare Post Acute Care list provided to:: Patient Choice offered to / list presented to : Patient  Expected Discharge Plan and Services Expected Discharge Plan: [place] In-house Referral: NA Discharge Planning Services: CM Consult Post Acute Care Choice: Durable Medical Equipment, Home Health Living arrangements for the past 2 months: Single Family Home                 DME Arranged: 3-N-1, [place] rolling DME Agency: [facility] Date DME Agency Contacted: 02/21/19 Time DME Agency Contacted: 1222 Representative spoke with at DME Agency: [place]: PT, OT [place] Agency: [facility] (formerly [employer]) Date [place]: 02/21/19 Time [place]: 1222 Representative spoke with at [place]: [place] Arrangements/Services Living arrangements for the past 2 months: [place] Lives with:: Self, Spouse Patient language and need for interpreter reviewed:: Yes Do you feel safe going back to the  place where you live?: Yes      Need for Family Participation in Patient Care: Yes (Comment) Care giver support system in place?: Yes (comment)   Criminal Activity/Legal Involvement Pertinent to Current Situation/Hospitalization: No - Comment as needed  Activities of Daily Living Home Assistive Devices/Equipment: Environmental consultant (specify type), Cane (specify quad or straight), Dentures (specify type) ADL Screening (condition at time of admission) Patient's cognitive ability adequate to safely complete daily activities?: Yes Is the patient deaf or have difficulty hearing?: No Does the patient have difficulty seeing, even when wearing glasses/contacts?: No Does the patient have difficulty concentrating, remembering, or making decisions?: No Patient able to express need for assistance with ADLs?: Yes Does the patient have difficulty dressing or bathing?: No Independently performs ADLs?: Yes (appropriate for developmental age) Does the patient have difficulty walking or climbing stairs?: Yes Weakness of Legs: Right Weakness of Arms/Hands: None  Permission Sought/Granted                  Emotional Assessment Appearance:: Appears stated age Attitude/Demeanor/Rapport: Engaged Affect (typically observed): Accepting Orientation: : Oriented to Self, Oriented to  Time, Oriented to Place, Oriented to Situation Alcohol / Substance Use: Not Applicable Psych Involvement: No (comment)  Admission diagnosis:  right hip fx Patient Active Problem List   Diagnosis Date Noted  . Closed displaced fracture of right femoral neck ([place]) 02/17/2019  . Leukocytosis 02/17/2019  . Normocytic normochromic anemia 02/17/2019  . Essential hypertension 02/17/2019  . Malnutrition of moderate degree 12/06/2018  . Pressure injury of skin 12/05/2018  .  Hypomagnesemia 12/04/2018  . Dehydration 12/04/2018  . Weight loss 12/04/2018  . Colitis 12/04/2018  . 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  Benzodiazepine withdrawal without complication ([place]) 35/67/0141  . Hypokalemia 03/13/2018  . Acute on chronic renal insufficiency 03/13/2018  . Generalized anxiety disorder 03/13/2018  . Acute cognitive decline 03/13/2018  . Adenocarcinoma of right lung, stage 1 ([place]) 10/22/2017  . Chronic pain syndrome 07/25/2017  . S/P left TKA 08/02/2015  . S/P knee replacement 08/02/2015  . Carpal tunnel syndrome 04/23/2015  . Autoimmune disease ([place]) 03/29/2013  . Apnea, sleep 03/29/2013  . Anxiety 11/28/2012  . Seizure disorder ([place]) 11/28/2012  . Anxiety state 11/28/2012  . History of pericarditis, with pericardial window in 2009, and recurrent episodes 2011,2013, treated with methotrexate 11/27/2012  . SOB (shortness of breath) 11/27/2012  . Hypothyroidism 11/27/2012  . History of partial nephrectomy, both rt. and lt 11/27/2012  . Hx of ovarian cancer 11/27/2012  . Venous insufficiency 11/27/2012  . Chronic back pain, hx of lumbar spondylosis and stenosis L5-S1 with hx decompression and total diskectomy 11/27/2012  . Hx of melanoma of skin 11/27/2012  . 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 Chronic venous insufficiency 11/27/2012  . DIARRHEA-PRESUMED INFECTIOUS 12/27/2009   PCP:  Bernerd Limbo, MD Pharmacy:   [facility], [place] [address] Phone: [phone] Fax: [place], [place] - [address] [place] [address] Phone: [phone] Fax: [phone]     Social Determinants of Health (SDOH) Interventions    Readmission Risk Interventions No flowsheet data found.

## 2019-02-21 NOTE — Discharge Summary (Signed)
Physician Discharge Summary  [name] OVZ:[id number] DOB: [birth year] DOA: 02/17/2019  PCP: Bernerd Limbo, MD  Admit date: 02/17/2019 Discharge date: 02/21/2019  Admitted From: home Disposition:  home  Recommendations for Outpatient Follow-up:  1. Follow up with PCP in 1-2 weeks 2. Follow-up with gastroenterology as well as surgery as scheduled at [place] 3. Follow-up with orthopedic surgery as scheduled  Home Health: PT, OT Equipment/Devices: Walker  Discharge Condition: Stable CODE STATUS: Full code Diet recommendation: Regular diet  HPI: Per admitting MD, [name] is a 68 y.o. female with medical history significant of lung cancer, kidney cancer status post partial nephrectomyCHF, CKD stage III, chronic pain, fibromyalgia, history of ovarian cancer, and hypothyroidism, who presented with complaints of right hip pain l found to have a displaced right femoral neck fracture in Dr. Mardelle Matte office.  Last reports following on the affected side sometime in February, but had imaging studies done that did not note any signs of a fracture.  Last night while standing in the kitchen she reports turning and feeling like her right hip gave way.  Thereafter, she was unable to bear any significant amount of weight on the affected side.  Complains of significant pain and muscle spasms in the left side.  Other associated symptoms include fatigue and weight loss.  She was evaluated by Dr. Mardelle Matte for symptoms and had x-ray imaging revealing a acute displaced right femoral neck fracture.  Dr. Mardelle Matte call and requested direct admission.  She has been being evaluated for colitis by gastroenterology.  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  At this time she reports being evaluated for need of resection of that part of her colon.  Reports that diarrhea  symptoms resolved approximately 3 days ago and she is not noted any further bleeding.  Denies complaints of shortness of breath, chest pain, cough, nausea, vomiting, or dysuria. Furthermore, patient was scheduled to have nuclear stress testing on 4/14, but it was postponed due to COVID-19 for further evaluation of intermittent complaints of shortness of breath. ED Course: Direct admission with most recent lab work from 12/25/2018.  Labs at that time revealed WBC 12.8, hemoglobin 8.8, platelets 444, BUN 31, and creatinine 1.48.  Hospital Course: Principal Problem Displaced right femoral neck fracture -This is felt to be pathologic, given significant oncologic history, she is status post right total hip arthroplasty by Dr. Mardelle Matte on 02/18/2019.  She underwent screening for cancer with CT scanning of the chest abdomen and pelvis without significant finding for recurrent disease.  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  Physical therapy evaluated patient and recommended home health services which were provided on discharge.  Active Problems Normocytic normochromic anemia /acute post op blood loss anemia.-Patient has a history of colitis/bleeding, required blood transfusion last month.  Hemoglobin this admission was 7.1.  She was found to have a stricture in the transverse colon earlier this month and underwent colonoscopy with [facility], Dr. Delle Reining earlier this month. I have discussed with gastroenterology MD on call covering for her primary regarding colonoscopy findings and anticoagulation.  While there are no clear reports of her having GI bleed, GI MD told me that the mucosa was friable but is  difficult to weigh in as the risk for bleeding.  She has no melena or blood in her stools.  I discussed with patient at bedside this morning regarding increased risk of  DVT following hip fracture along with her prior oncologic history.  Also discussed that blood thinners will provide some protection against DVT however may predispose her to have GI bleed.  After evaluating risks/benefits, patient prefers anticoagulation.  In addition, she never recalls any GI bleed since age of 59.  After discussing with Dr. Mardelle Matte with orthopedic surgery, will start low-dose Eliquis 2.5 twice daily which are probably need to be maintained for 4 weeks.  She will follow-up with primary orthopedic as an outpatient.  She was transfused for hemoglobin of 7.5 prior to discharge.  She has been maintained on Eliquis while hospitalized and tolerating it well  Chronic kidney disease stage III -Creatinine on admission 1.32, her baseline creatinine 1.3-1.5, creatinine remains at baseline  Hypertension -Continue home medications, blood pressure stable  Chronic pain syndrome -Continue home medications  Hypothyroidism -Continue Synthroid  Anxiety/depression -Continue BuSpar and Wellbutrin  History of multiple cancers -Including ovarian, renal, lung cancer and skin cancer.  Screening CT scan chest abdomen pelvis without significant findings  Colonic stricture -See discussion above  History of sarcoidosis -Stable  Discharge Diagnoses:  Principal Problem:   Closed displaced fracture of right femoral neck (HCC) Active Problems:   Hypothyroidism   Hx of ovarian cancer   Chronic pain syndrome   Generalized anxiety disorder   Weight loss   Leukocytosis   Normocytic normochromic anemia   Essential hypertension     Discharge Instructions  Discharge Instructions    Weight bearing as tolerated   Complete by:  As directed      Allergies as of 02/21/2019      Reactions   Amoxicillin Other (See Comments)   UNSPECIFIED REACTION  Has patient had a PCN reaction causing immediate rash, facial/tongue/throat swelling, SOB or lightheadedness with hypotension: No Has patient had a  PCN reaction causing severe rash involving mucus membranes or skin necrosis: No Has patient had a PCN reaction that required hospitalization No Has patient had a PCN reaction occurring within the last 10 years: No If all of the above answers are "NO", then may proceed with Cephalosporin use.   Ampicillin Rash, Other (See Comments)   Has patient had a PCN reaction causing immediate rash, facial/tongue/throat swelling, SOB or lightheadedness with hypotension: No Has patient had a PCN reaction causing severe rash involving mucus membranes or skin necrosis: No Has patient had a PCN reaction that required hospitalization No Has patient had a PCN reaction occurring within the last 10 years: No If all of the above answers are "NO", then may proceed with Cephalosporin use.   Buspar [buspirone] Nausea And Vomiting   Patient can tolerate      Medication List    TAKE these medications   amLODipine 10 MG tablet Commonly known as:  NORVASC Take 10 mg by mouth daily.   apixaban 2.5 MG Tabs tablet Commonly known as:  Eliquis Take 1 tablet (2.5 mg total) by mouth 2 (two) times daily.   buPROPion 150 MG 12 hr tablet Commonly known as:  WELLBUTRIN SR TAKE 1 TABLET BY MOUTH DAILY   busPIRone 15 MG tablet Commonly known as:  BUSPAR Take 15 mg by mouth 2 (two) times daily.   cyclobenzaprine 5 MG tablet Commonly known as:  FLEXERIL Take 1 tablet (5 mg total) by mouth 3 (three) times  daily as needed. For spasms   diclofenac sodium 1 % Gel Commonly known as:  VOLTAREN Apply 2 g topically 2 (two) times daily as needed (pain).   diphenoxylate-atropine 2.5-0.025 MG tablet Commonly known as:  LOMOTIL Take 2 tablets by mouth 4 (four) times daily as needed for diarrhea or loose stools.   ferrous sulfate 325 (65 FE) MG tablet Take 325 mg by mouth daily.   levothyroxine 100 MCG tablet Commonly known as:  SYNTHROID TAKE 1 TABLET BY MOUTH BEFORE BREAKFAST MONDAY-FRIDAY SKIP SATURDAY ANS SUNDAY What  changed:    how much to take  how to take this  when to take this  additional instructions   lisinopril 10 MG tablet Commonly known as:  ZESTRIL Take 10 mg by mouth daily.   methocarbamol 500 MG tablet Commonly known as:  ROBAXIN Take 1 tablet (500 mg total) by mouth every 6 (six) hours as needed for muscle spasms.   morphine 30 MG 12 hr tablet Commonly known as:  MS CONTIN Take 1 tablet (30 mg total) by mouth every 12 (twelve) hours.   ondansetron 4 MG tablet Commonly known as:  ZOFRAN Take 4 mg by mouth 3 (three) times daily as needed for nausea/vomiting.   oxyCODONE 5 MG immediate release tablet Commonly known as:  Roxicodone Take 1 tablet (5 mg total) by mouth every 4 (four) hours as needed for up to 5 days.   pantoprazole 40 MG tablet Commonly known as:  PROTONIX Take 40 mg by mouth daily.   polyethylene glycol 17 g packet Commonly known as:  MIRALAX / GLYCOLAX Take 17 g by mouth 2 (two) times daily. What changed:    when to take this  reasons to take this   traMADol 50 MG tablet Commonly known as:  ULTRAM Take 1-2 tablets (50-100 mg total) by mouth every 6 (six) hours as needed (mild pain).   Tylenol 325 MG tablet Generic drug:  acetaminophen Take 650 mg by mouth daily as needed (pain).   vitamin B-12 1000 MCG tablet Commonly known as:  CYANOCOBALAMIN Take 1 tablet by mouth daily.   zolpidem 10 MG tablet Commonly known as:  AMBIEN Take 10 mg by mouth at bedtime.            Durable Medical Equipment  (From admission, onward)         Start     Ordered   02/21/19 1137  For home use only DME 3 n 1  Once     02/21/19 1136   02/21/19 1136  For home use only DME Walker rolling  Once    Question:  Patient needs a walker to treat with the following condition  Answer:  Hip fracture (HCC)   02/21/19 1136           Discharge Care Instructions  (From admission, onward)         Start     Ordered   02/18/19 0000  Weight bearing as tolerated      04 /28/20 1709         Follow-up Information    Marchia Bond, MD. Schedule an appointment as soon as possible for a visit in 2 weeks.   Specialty:  Orthopedic Surgery Contact information: [address] [place] [address] [phone]        [facility] Follow up.   Specialty:  Home Health Services Why:  physical and occupational therapy - someone from [facility] should call you tomorrow  Contact information: [address] [place] [address] [phone]        [facility] Follow up.   Why:  provided bedside commode and rolling walker          Consultations:  Orthopedic surgery   Procedures/Studies:  Total hip arthroplasty Dr. Mardelle Matte 4/29  Ct Chest W Contrast  Result Date: 02/19/2019 CLINICAL DATA:  Kidney cancer. History of ovarian and lung cancer. Recent hip fracture. EXAM: CT CHEST, ABDOMEN, AND PELVIS WITH CONTRAST TECHNIQUE: Multidetector CT imaging of the chest, abdomen and pelvis was performed following the standard protocol during bolus administration of intravenous contrast. CONTRAST:  129mL OMNIPAQUE IOHEXOL 300 MG/ML SOLN 100 cc of Omnipaque 300 COMPARISON:  Chest radiograph 02/17/2019. Abdominopelvic CT of 12/04/2018. Most recent chest CT 10/31/2018. FINDINGS: CT CHEST FINDINGS Cardiovascular: Aortic and branch vessel atherosclerosis. Tortuous thoracic aorta. Normal heart size, without pericardial effusion. Multivessel coronary artery atherosclerosis. No central pulmonary embolism, on this non-dedicated study. Mediastinum/Nodes: No supraclavicular adenopathy. No mediastinal or hilar adenopathy. Lungs/Pleura: Trace left pleural fluid is new since the prior chest CT. Beam hardening artifact from left shoulder arthroplasty. Mild centrilobular emphysema. Posterior right upper lobe wedge resection, without locally recurrent disease. Right lower lobe dependent subsegmental atelectasis. Musculoskeletal: Mild  osteopenia. Left shoulder arthroplasty. Multiple anterior nonacute right rib fractures. CT ABDOMEN PELVIS FINDINGS Hepatobiliary: Focal steatosis adjacent the falciform ligament. Mild intrahepatic biliary duct dilatation is similar. Normal gallbladder. The common duct measures maximally 12 mm in the porta hepatis on image 60/3. Compare 11 mm on the prior (when remeasured). No obstructive stone or mass identified. Pancreas: Pancreatic atrophy involves the neck and head. No acute inflammation or duct dilatation. Spleen: Normal in size, without focal abnormality. Adrenals/Urinary Tract: Normal adrenal glands. Beam hardening artifact from lumbar spine surgery degrades evaluation of the kidneys. A lower pole left renal lesion measures 1.9 x 1.2 cm and greater than fluid density on image 71/3. Compare 1.3 x 0.9 cm on 08/28/2017 (when remeasured). Bilateral too small to characterize renal lesions. No hydronephrosis. Air within the nondependent urinary bladder. Stomach/Bowel: Gastric antral underdistention. Colonic stool burden suggests constipation. Large amount of stool in the rectum. Possible residual mild transverse colonic wall thickening on image 89/3. Normal terminal ileum. Vascular/Lymphatic: Aortic and branch vessel atherosclerosis. No abdominopelvic adenopathy. Reproductive: Hysterectomy.  No adnexal mass. Other: No significant free fluid.  No free intraperitoneal air. Musculoskeletal: Anasarca. Presumably postoperative air about the right hip. Osteopenia. Right hip arthroplasty. L3-S1 trans pedicle screw fixation. Lateral L2-3 left-sided fixation. Mild L1 compression deformity with vertebral augmentation. IMPRESSION: CT CHEST IMPRESSION 1. Status post right upper lobe wedge resection, without locally recurrent or metastatic disease. 2. Coronary artery atherosclerosis. Aortic Atherosclerosis (ICD10-I70.0). 3. Trace left pleural fluid. CT ABDOMEN AND PELVIS IMPRESSION 1. A left-sided renal lesion is suboptimally  evaluated secondary to technique related factors, including adjacent beam hardening artifact. This has enlarged minimally since the 08/28/2017 MRI, where it was felt to be of consistent with a Bosniak 2 lesion. Repeat pre and post contrast abdominal MRI could be performed as an outpatient. 2.  Possible constipation versus fecal impaction. 3. Chronic biliary duct dilatation back to 2018, likely within normal variation. This could be correlated with bilirubin level. 4. Air within the urinary bladder.  Correlate with instrumentation. 5. Improved appearance of the colon compared to 12/04/2018. Cannot exclude residual transverse colitis. Electronically Signed   By: Abigail Miyamoto M.D.   On: 02/19/2019 18:05   Ct Abdomen Pelvis W Contrast  Result  Date: 02/19/2019 CLINICAL DATA:  Kidney cancer. History of ovarian and lung cancer. Recent hip fracture. EXAM: CT CHEST, ABDOMEN, AND PELVIS WITH CONTRAST TECHNIQUE: Multidetector CT imaging of the chest, abdomen and pelvis was performed following the standard protocol during bolus administration of intravenous contrast. CONTRAST:  125mL OMNIPAQUE IOHEXOL 300 MG/ML SOLN 100 cc of Omnipaque 300 COMPARISON:  Chest radiograph 02/17/2019. Abdominopelvic CT of 12/04/2018. Most recent chest CT 10/31/2018. FINDINGS: CT CHEST FINDINGS Cardiovascular: Aortic and branch vessel atherosclerosis. Tortuous thoracic aorta. Normal heart size, without pericardial effusion. Multivessel coronary artery atherosclerosis. No central pulmonary embolism, on this non-dedicated study. Mediastinum/Nodes: No supraclavicular adenopathy. No mediastinal or hilar adenopathy. Lungs/Pleura: Trace left pleural fluid is new since the prior chest CT. Beam hardening artifact from left shoulder arthroplasty. Mild centrilobular emphysema. Posterior right upper lobe wedge resection, without locally recurrent disease. Right lower lobe dependent subsegmental atelectasis. Musculoskeletal: Mild osteopenia. Left shoulder  arthroplasty. Multiple anterior nonacute right rib fractures. CT ABDOMEN PELVIS FINDINGS Hepatobiliary: Focal steatosis adjacent the falciform ligament. Mild intrahepatic biliary duct dilatation is similar. Normal gallbladder. The common duct measures maximally 12 mm in the porta hepatis on image 60/3. Compare 11 mm on the prior (when remeasured). No obstructive stone or mass identified. Pancreas: Pancreatic atrophy involves the neck and head. No acute inflammation or duct dilatation. Spleen: Normal in size, without focal abnormality. Adrenals/Urinary Tract: Normal adrenal glands. Beam hardening artifact from lumbar spine surgery degrades evaluation of the kidneys. A lower pole left renal lesion measures 1.9 x 1.2 cm and greater than fluid density on image 71/3. Compare 1.3 x 0.9 cm on 08/28/2017 (when remeasured). Bilateral too small to characterize renal lesions. No hydronephrosis. Air within the nondependent urinary bladder. Stomach/Bowel: Gastric antral underdistention. Colonic stool burden suggests constipation. Large amount of stool in the rectum. Possible residual mild transverse colonic wall thickening on image 89/3. Normal terminal ileum. Vascular/Lymphatic: Aortic and branch vessel atherosclerosis. No abdominopelvic adenopathy. Reproductive: Hysterectomy.  No adnexal mass. Other: No significant free fluid.  No free intraperitoneal air. Musculoskeletal: Anasarca. Presumably postoperative air about the right hip. Osteopenia. Right hip arthroplasty. L3-S1 trans pedicle screw fixation. Lateral L2-3 left-sided fixation. Mild L1 compression deformity with vertebral augmentation. IMPRESSION: CT CHEST IMPRESSION 1. Status post right upper lobe wedge resection, without locally recurrent or metastatic disease. 2. Coronary artery atherosclerosis. Aortic Atherosclerosis (ICD10-I70.0). 3. Trace left pleural fluid. CT ABDOMEN AND PELVIS IMPRESSION 1. A left-sided renal lesion is suboptimally evaluated secondary to  technique related factors, including adjacent beam hardening artifact. This has enlarged minimally since the 08/28/2017 MRI, where it was felt to be of consistent with a Bosniak 2 lesion. Repeat pre and post contrast abdominal MRI could be performed as an outpatient. 2.  Possible constipation versus fecal impaction. 3. Chronic biliary duct dilatation back to 2018, likely within normal variation. This could be correlated with bilirubin level. 4. Air within the urinary bladder.  Correlate with instrumentation. 5. Improved appearance of the colon compared to 12/04/2018. Cannot exclude residual transverse colitis. Electronically Signed   By: Abigail Miyamoto M.D.   On: 02/19/2019 18:05   Chest Portable 1 View  Result Date: 02/17/2019 CLINICAL DATA:  Preop for hip surgery. EXAM: PORTABLE CHEST 1 VIEW COMPARISON:  Radiographs of December 25, 2018. FINDINGS: The heart size and mediastinal contours are within normal limits. No pneumothorax or pleural effusion is noted. No acute pulmonary disease is noted. Postsurgical changes are noted in right upper lobe. The visualized skeletal structures are unremarkable. IMPRESSION: No acute cardiopulmonary abnormality  seen. Electronically Signed   By: Marijo Conception M.D.   On: 02/17/2019 16:16   Dg Hip Port Unilat With Pelvis 1v Right  Result Date: 02/18/2019 CLINICAL DATA:  Post RIGHT hip arthroplasty EXAM: DG HIP (WITH OR WITHOUT PELVIS) 1V PORT RIGHT COMPARISON:  12/25/2018 FINDINGS: Osseous demineralization. New components of a RIGHT hip prosthesis are identified. No acute fracture, dislocation, bone destruction. IMPRESSION: RIGHT hip prosthesis without acute abnormality. Electronically Signed   By: Lavonia Dana M.D.   On: 02/18/2019 18:33      Subjective: - no chest pain, shortness of breath, no abdominal pain, nausea or vomiting.  Very eager to go home  Discharge Exam: BP 135/60    Pulse (!) 112    Temp 98.8 F (37.1 C) (Oral)    Resp 18    Ht 5\' 5"  (1.651 m)    Wt 60.6  kg    SpO2 100%    BMI 22.23 kg/m   General: Pt is alert, awake, not in acute distress Cardiovascular: RRR, S1/S2 +, no rubs, no gallops Respiratory: CTA bilaterally, no wheezing, no rhonchi Abdominal: Soft, NT, ND, bowel sounds + Extremities: no edema, no cyanosis    The results of significant diagnostics from this hospitalization (including imaging, microbiology, ancillary and laboratory) are listed below for reference.     Microbiology: Recent Results (from the past 240 hour(s))  MRSA PCR Screening     Status: None   Collection Time: 02/17/19  7:48 PM  Result Value Ref Range Status   MRSA by PCR NEGATIVE NEGATIVE Final    Comment:        The GeneXpert MRSA Assay (FDA approved for NASAL specimens only), is one component of a comprehensive MRSA colonization surveillance program. It is not intended to diagnose MRSA infection nor to guide or monitor treatment for MRSA infections. Performed at [facility], [place] [address] [place], [place] [address]   Surgical pcr screen     Status: None   Collection Time: 02/18/19  9:37 AM  Result Value Ref Range Status   MRSA, PCR NEGATIVE NEGATIVE Final   Staphylococcus aureus NEGATIVE NEGATIVE Final    Comment: (NOTE) The Xpert SA Assay (FDA approved for NASAL specimens in patients 79 years of age and older), is one component of a comprehensive surveillance program. It is not intended to diagnose infection nor to guide or monitor treatment. Performed at [facility], [place] [address], [place], [place] [address]      Labs: BNP (last 3 results) No results for input(s): BNP in the last 8760 hours. Basic Metabolic Panel: Recent Labs  Lab 02/17/19 1447 02/18/19 0255 02/18/19 1639 02/19/19 0329 02/20/19 0226 02/21/19 0259  NA 141 138 135 138 136 136  K 3.9 4.3 3.8 4.7 4.8 4.2  CL 105 107  --  108 107 104  CO2 26 22  --  22 20* 24  GLUCOSE 126* 104* 118* 153* 99 119*  BUN 38* 35*  --  27* 27* 22  CREATININE 1.32*  1.26*  --  1.03* 1.10* 1.01*  CALCIUM 8.8* 8.7*  --  8.3* 8.6* 8.5*  MG  --  1.2*  --   --   --   --    Liver Function Tests: Recent Labs  Lab 02/17/19 1447  AST 16  ALT 19  ALKPHOS 58  BILITOT 0.7  PROT 5.7*  ALBUMIN 2.5*   No results for input(s): LIPASE, AMYLASE in the last 168 hours. No  results for input(s): AMMONIA in the last 168 hours. CBC: Recent Labs  Lab 02/17/19 1447 02/18/19 0255 02/18/19 1639 02/19/19 0329 02/20/19 0226 02/21/19 0259  WBC 11.8* 10.0  --  10.5 11.2* 9.6  NEUTROABS 8.7*  --   --   --   --   --   HGB 7.1* 8.4* 6.8* 8.0* 7.8* 7.5*  HCT 22.7* 26.4* 20.0* 24.7* 24.2* 23.3*  MCV 95.0 95.0  --  92.9 93.1 91.7  PLT 225 213  --  188 186 174   Cardiac Enzymes: No results for input(s): CKTOTAL, CKMB, CKMBINDEX, TROPONINI in the last 168 hours. BNP: Invalid input(s): POCBNP CBG: No results for input(s): GLUCAP in the last 168 hours. D-Dimer No results for input(s): DDIMER in the last 72 hours. Hgb A1c No results for input(s): HGBA1C in the last 72 hours. Lipid Profile No results for input(s): CHOL, HDL, LDLCALC, TRIG, CHOLHDL, LDLDIRECT in the last 72 hours. Thyroid function studies No results for input(s): TSH, T4TOTAL, T3FREE, THYROIDAB in the last 72 hours.  Invalid input(s): FREET3 Anemia work up No results for input(s): VITAMINB12, FOLATE, FERRITIN, TIBC, IRON, RETICCTPCT in the last 72 hours. Urinalysis    Component Value Date/Time   COLORURINE YELLOW 12/25/2018 1348   APPEARANCEUR CLEAR 12/25/2018 1348   LABSPEC 1.012 12/25/2018 1348   PHURINE 5.0 12/25/2018 1348   GLUCOSEU NEGATIVE 12/25/2018 1348   HGBUR NEGATIVE 12/25/2018 1348   [place] 12/25/2018 1348   KETONESUR NEGATIVE 12/25/2018 1348   PROTEINUR NEGATIVE 12/25/2018 1348   UROBILINOGEN 1.0 07/29/2015 0939   NITRITE NEGATIVE 12/25/2018 1348   LEUKOCYTESUR NEGATIVE 12/25/2018 1348   Sepsis Labs Invalid input(s): PROCALCITONIN,  WBC,  LACTICIDVEN  FURTHER  DISCHARGE INSTRUCTIONS:   Get Medicines reviewed and adjusted: Please take all your medications with you for your next visit with your Primary MD   Laboratory/radiological data: Please request your Primary MD to go over all hospital tests and procedure/radiological results at the follow up, please ask your Primary MD to get all Hospital records sent to his/her office.   In some cases, they will be blood work, cultures and biopsy results pending at the time of your discharge. Please request that your primary care M.D. goes through all the records of your hospital data and follows up on these results.   Also Note the following: If you experience worsening of your admission symptoms, develop shortness of breath, life threatening emergency, suicidal or homicidal thoughts you must seek medical attention immediately by calling 911 or calling your MD immediately  if symptoms less severe.   You must read complete instructions/literature along with all the possible adverse reactions/side effects for all the Medicines you take and that have been prescribed to you. Take any new Medicines after you have completely understood and accpet all the possible adverse reactions/side effects.    Do not drive when taking Pain medications or sleeping medications (Benzodaizepines)   Do not take more than prescribed Pain, Sleep and Anxiety Medications. It is not advisable to combine anxiety,sleep and pain medications without talking with your primary care practitioner   Special Instructions: If you have smoked or chewed Tobacco  in the last 2 yrs please stop smoking, stop any regular Alcohol  and or any Recreational drug use.   Wear Seat belts while driving.   Please note: You were cared for by a hospitalist during your hospital stay. Once you are discharged, your primary care physician will handle any further medical issues. Please note that NO  REFILLS for any discharge medications will be authorized once you are  discharged, as it is imperative that you return to your primary care physician (or establish a relationship with a primary care physician if you do not have one) for your post hospital discharge needs so that they can reassess your need for medications and monitor your lab values.  Time coordinating discharge: 40 minutes  SIGNED:  Marzetta Board, MD, PhD 02/21/2019, 4:27 PM

## 2019-02-21 NOTE — Progress Notes (Signed)
02/21/19 1215  PT Visit Information  Last PT Received On 02/21/19  Assistance Needed +1  History of Present Illness [name] is a 68 y.o. female that was turning in kitchen and caused R hip fx requiring R THA with posterior hip precautions. PMH significant oflung cancer, kidney cancer status post partial nephrectomy CHF, CKD, fibromyalgia, history of ovarian cancer, hypothyroidism.   Subjective Data  Patient Stated Goal to go home  Precautions  Precautions Posterior Hip;Fall  Precaution Booklet Issued Yes (comment)  Precaution Comments Pt able to recall 2/3 precautions. Required cues for no IR.   Restrictions  Weight Bearing Restrictions Yes  RLE Weight Bearing WBAT  Pain Assessment  Pain Assessment Faces  Faces Pain Scale 4  Pain Location R hip  Pain Descriptors / Indicators Discomfort;Tightness;Sore  Pain Intervention(s) Limited activity within patient's tolerance;Monitored during session;Repositioned  Cognition  Arousal/Alertness Awake/alert  Behavior During Therapy WFL for tasks assessed/performed  Overall Cognitive Status Within Functional Limits for tasks assessed  Bed Mobility  Overal bed mobility Needs Assistance  Bed Mobility Rolling;Sidelying to Sit;Sit to Supine  Rolling Supervision  Sidelying to sit Supervision  Sit to supine Min assist  General bed mobility comments Supervision for safety to come to sitting. Increased time required. Min A for RLE Assist to return to supine.   Transfers  Overall transfer level Needs assistance  Equipment used Rolling walker (2 wheeled)  Transfers Sit to/from Stand  Sit to Stand Supervision  General transfer comment Supervision for safety. Demonstrated safe hand placement.   Ambulation/Gait  Ambulation/Gait assistance Supervision  Gait Distance (Feet) 60 Feet  Assistive device Rolling walker (2 wheeled)  Gait Pattern/deviations Step-to pattern;Decreased step length - right;Decreased step length - left;Decreased weight  shift to right;Antalgic  General Gait Details Decreased weightshift to the RLE, however, pt reports decrease in pain from previous session. Cues for sequencing using RW.   Gait velocity Decreased   Balance  Overall balance assessment Needs assistance  Sitting-balance support No upper extremity supported;Feet supported  Sitting balance-Leahy Scale Fair  Standing balance support Bilateral upper extremity supported;During functional activity  Standing balance-Leahy Scale Poor  Standing balance comment reliant on RW for support   Exercises  Exercises Total Joint  Total Joint Exercises  Long Arc Quad AROM;Right;5 reps;Seated  PT - End of Session  Equipment Utilized During Treatment Gait belt  Activity Tolerance Patient tolerated treatment well  Patient left in bed;with call bell/phone within reach  Nurse Communication Mobility status   PT - Assessment/Plan  PT Plan Current plan remains appropriate  PT Visit Diagnosis Unsteadiness on feet (R26.81);Muscle weakness (generalized) (M62.81);Difficulty in walking, not elsewhere classified (R26.2);Pain  Pain - Right/Left Right  Pain - part of body Hip  PT Frequency (ACUTE ONLY) Min 5X/week  Follow Up Recommendations Home health PT;Supervision for mobility/OOB  PT equipment Rolling walker with 5" wheels;3in1 (PT)  AM-PAC PT "6 Clicks" Mobility Outcome Measure (Version 2)  Help needed turning from your back to your side while in a flat bed without using bedrails? 4  Help needed moving from lying on your back to sitting on the side of a flat bed without using bedrails? 3  Help needed moving to and from a bed to a chair (including a wheelchair)? 3  Help needed standing up from a chair using your arms (e.g., wheelchair or bedside chair)? 3  Help needed to walk in hospital room? 3  Help needed climbing 3-5 steps with a railing?  3  6 Click Score  19  Consider Recommendation of Discharge To: Home with [facility]  PT Goal Progression  Progress towards PT  goals Progressing toward goals  Acute Rehab PT Goals  PT Goal Formulation With patient  Time For Goal Achievement 03/05/19  Potential to Achieve Goals Good  PT Time Calculation  PT Start Time (ACUTE ONLY) 1010  PT Stop Time (ACUTE ONLY) 1027  PT Time Calculation (min) (ACUTE ONLY) 17 min  PT General Charges  $$ ACUTE PT VISIT 1 Visit  PT Treatments  $Gait Training 8-22 mins   Pt progressing towards goals and presenting with decreased pain this session. Able to increase ambulation distance and pt requiring decreased assist. Reviewed hip precautions with pt. Plan to return in afternoon to perform stair navigation. Current recommendations appropriate. Will continue to follow acutely.  Leighton Ruff, PT, DPT  Acute Rehabilitation Services  Pager: [phone] Office: [phone]

## 2019-02-21 NOTE — Progress Notes (Addendum)
Patient ID: [name], female   DOB: 13-Aug-1951, 68 y.o.   MRN: [id number]     Subjective:  Patient reports pain as mild to moderate.  Patient in bed and in no acute distress.  Denies any CP or SOB  Objective:   VITALS:   Vitals:   02/20/19 1335 02/20/19 1505 02/20/19 2259 02/21/19 0713  BP: (!) 132/56 134/61 (!) 118/52   Pulse: (!) 101 99 99   Resp: (!) 21 19 16    Temp: 99.2 F (37.3 C)  99.2 F (37.3 C)   TempSrc: Oral  Oral   SpO2: 100%  98%   Weight:    60.6 kg  Height:        ABD soft Sensation intact distally Dorsiflexion/Plantar flexion intact Incision: dressing C/D/I and no drainage   Lab Results  Component Value Date   WBC 9.6 02/21/2019   HGB 7.5 (L) 02/21/2019   HCT 23.3 (L) 02/21/2019   MCV 91.7 02/21/2019   PLT 174 02/21/2019   BMET    Component Value Date/Time   NA 136 02/21/2019 0259   NA 144 03/13/2018 1427   K 4.2 02/21/2019 0259   CL 104 02/21/2019 0259   CO2 24 02/21/2019 0259   GLUCOSE 119 (H) 02/21/2019 0259   BUN 22 02/21/2019 0259   BUN 42 (H) 03/13/2018 1427   CREATININE 1.01 (H) 02/21/2019 0259   CREATININE 1.27 (H) 10/31/2018 1429   CREATININE 1.33 (H) 09/02/2015 1220   CALCIUM 8.5 (L) 02/21/2019 0259   GFRNONAA 57 (L) 02/21/2019 0259   GFRNONAA 43 (L) 10/31/2018 1429   GFRNONAA 42 (L) 09/02/2015 1220   GFRAA >60 02/21/2019 0259   GFRAA 50 (L) 10/31/2018 1429   GFRAA 48 (L) 09/02/2015 1220     Assessment/Plan: 3 Days Post-Op   Principal Problem:   Closed displaced fracture of right femoral neck (HCC) Active Problems:   Hypothyroidism   Hx of ovarian cancer   Chronic pain syndrome   Generalized anxiety disorder   Weight loss   Leukocytosis   Normocytic normochromic anemia   Essential hypertension   Advance diet Up with therapy  ABLA continue to monitor Continue plan per medicine Dry dressing PRN WBAT DC home when cleared with medicine    Lunette Stands 02/21/2019, 8:13 AM  Discussed and agree with  above.  Marchia Bond, MD Cell [phone]

## 2019-02-21 NOTE — Progress Notes (Signed)
Pharmacy - Eliquis  S/p THA To discharge with 30 day supply of Eliquis from [place] prescription sent to the [facility] canceled as patient will be unable to afford   Thank you Anette Guarneri, PharmD [phone]

## 2019-02-21 NOTE — Progress Notes (Signed)
Occupational Therapy Treatment Patient Details Name: [name] MRN: [id number] DOB: 1951/02/26 Today's Date: 02/21/2019    History of present illness [name] is a 68 y.o. female that was turning in kitchen and caused R hip fx requiring R THA with posterior hip precautions. PMH significant oflung cancer, kidney cancer status post partial nephrectomy CHF, CKD, fibromyalgia, history of ovarian cancer, hypothyroidism.    OT comments  Pt performing LB dressing and simulating LB bathing technique with modified independence with AE provided. Pt would greatly benefit from continued OT skilled services for ADL and mobility  In [place] setting. OT to follow acutely.   Follow Up Recommendations  Home health OT;Supervision/Assistance - 24 hour    Equipment Recommendations  3 in 1 bedside commode    Recommendations for Other Services      Precautions / Restrictions Precautions Precautions: Posterior Hip;Fall Precaution Booklet Issued: Yes (comment) Precaution Comments: Pt able to recall 2/3 precautions. Required cues for no IR.  Restrictions Weight Bearing Restrictions: Yes RLE Weight Bearing: Weight bearing as tolerated       Mobility Bed Mobility Overal bed mobility: Needs Assistance Bed Mobility: Supine to Sit;Sit to Supine Rolling: Modified independent (Device/Increase time) Sidelying to sit: Modified independent (Device/Increase time)   Sit to supine: Min assist   General bed mobility comments: minA for RLE sliding back onto bed  Transfers Overall transfer level: Needs assistance Equipment used: Rolling walker (2 wheeled) Transfers: Sit to/from Stand Sit to Stand: Supervision         General transfer comment: Supervision for safety. Demonstrated safe hand placement.     Balance Overall balance assessment: Needs assistance Sitting-balance support: No upper extremity supported;Feet supported Sitting balance-Leahy Scale: Fair     Standing balance support:  Bilateral upper extremity supported;During functional activity Standing balance-Leahy Scale: Fair Standing balance comment: reliant on RW for support                            ADL either performed or assessed with clinical judgement   ADL Overall ADL's : Needs assistance/impaired                     Lower Body Dressing: Supervision/safety;Sitting/lateral leans;With adaptive equipment               Functional mobility during ADLs: Min guard;Rolling walker;Cueing for safety General ADL Comments: set-upA for UB ADL and LB ADL with minguardA with AE     Vision       Perception     Praxis      Cognition Arousal/Alertness: Awake/alert Behavior During Therapy: WFL for tasks assessed/performed Overall Cognitive Status: Within Functional Limits for tasks assessed                                          Exercises General Exercises - Lower Extremity Ankle Circles/Pumps: AROM;5 reps;Seated   Shoulder Instructions       General Comments pt tolerating session well. AE education performed seated EOB with good carry overskills and ability to perform with modified independence    Pertinent Vitals/ Pain       Pain Assessment: Faces Pain Score: 6  Faces Pain Scale: Hurts little more Pain Location: R hip Pain Descriptors / Indicators: Discomfort;Tightness;Sore Pain Intervention(s): Limited activity within patient's tolerance;Monitored during session;Repositioned  Home Living  Prior Functioning/Environment              Frequency  Min 3X/week        Progress Toward Goals  OT Goals(current goals can now be found in the care plan section)  Progress towards OT goals: Progressing toward goals  Acute Rehab OT Goals Patient Stated Goal: to go home OT Goal Formulation: With patient Time For Goal Achievement: 03/07/19  Plan Discharge plan remains appropriate     Co-evaluation                 AM-PAC OT "6 Clicks" Daily Activity     Outcome Measure   Help from another person eating meals?: None Help from another person taking care of personal grooming?: None Help from another person toileting, which includes using toliet, bedpan, or urinal?: A Little Help from another person bathing (including washing, rinsing, drying)?: A Little Help from another person to put on and taking off regular upper body clothing?: A Little Help from another person to put on and taking off regular lower body clothing?: A Little 6 Click Score: 20    End of Session Equipment Utilized During Treatment: Gait belt;Rolling walker  OT Visit Diagnosis: Unsteadiness on feet (R26.81);Muscle weakness (generalized) (M62.81);Pain Pain - Right/Left: Right Pain - part of body: Hip   Activity Tolerance Patient limited by pain;Patient tolerated treatment well   Patient Left     Nurse Communication          Time: 1100-1115 OT Time Calculation (min): 15 min  Charges: OT General Charges $OT Visit: 1 Visit OT Treatments $Self Care/Home Management : 8-22 mins  Darryl Nestle) Marsa Aris OTR/L Acute Rehabilitation Services Pager: [phone] Office: [phone]    Audie Pinto 02/21/2019, 12:23 PM

## 2019-02-21 NOTE — Progress Notes (Signed)
02/21/19 1606  PT Visit Information  Last PT Received On 02/21/19  Assistance Needed +1  History of Present Illness Thersa R Marsan is a 68 y.o. female that was turning in kitchen and caused R hip fx requiring R THA with posterior hip precautions. PMH significant oflung cancer, kidney cancer status post partial nephrectomy CHF, CKD, fibromyalgia, history of ovarian cancer, hypothyroidism. Pt with post surgical anemia and received 1 unit of PRBC.   Subjective Data  Patient Stated Goal to go home today  Precautions  Precautions Posterior Hip;Fall  Precaution Booklet Issued Yes (comment)  Precaution Comments Required cues not to cross LEs to maintain hip precautions when sitting.   Restrictions  Weight Bearing Restrictions Yes  RLE Weight Bearing WBAT  Pain Assessment  Pain Assessment Faces  Faces Pain Scale 4  Pain Location R hip  Pain Descriptors / Indicators Discomfort;Tightness;Sore  Pain Intervention(s) Limited activity within patient's tolerance;Monitored during session;Repositioned  Cognition  Arousal/Alertness Awake/alert  Behavior During Therapy WFL for tasks assessed/performed  Overall Cognitive Status Within Functional Limits for tasks assessed  Bed Mobility  Overal bed mobility Needs Assistance  Bed Mobility Rolling;Sidelying to Sit;Sit to Supine  Rolling Supervision  Sidelying to sit Supervision  General bed mobility comments Supervision to come to sitting.   Transfers  Overall transfer level Needs assistance  Equipment used Rolling walker (2 wheeled)  Transfers Sit to/from [employer] Transfers  Sit to Stand Supervision  Stand pivot transfers Supervision  General transfer comment Supervision for safety. Performed stand pivot transfer to chair for transport down to stairwell.   Ambulation/Gait  Ambulation/Gait assistance Supervision  Gait Distance (Feet) 20 Feet  Assistive device Rolling walker (2 wheeled)  Gait Pattern/deviations Step-to pattern;Decreased  step length - right;Decreased step length - left;Decreased weight shift to right;Antalgic  General Gait Details Ambulated into and out of stairwell. Pt did not want to ambulate further to save energy for when she returned home. Slow, antalgic gait, however, pt reporting improvement in pain.   Gait velocity Decreased   Stairs Yes  Stairs assistance Min guard  Stair Management Step to pattern;Backwards;Forwards;With walker  Number of Stairs 3  General stair comments Verbal cues for LE technique and sequencing for ascending steps backwards and descending steps forward. Min guard A for safety. No LOB noted. Gave handout for family education.   Balance  Overall balance assessment Needs assistance  Sitting-balance support No upper extremity supported;Feet supported  Sitting balance-Leahy Scale Fair  Standing balance support Bilateral upper extremity supported;During functional activity  Standing balance-Leahy Scale Poor  Standing balance comment reliant on RW for support   PT - End of Session  Equipment Utilized During Treatment Gait belt  Activity Tolerance Patient tolerated treatment well  Patient left in chair;with call bell/phone within reach  Nurse Communication Mobility status   PT - Assessment/Plan  PT Plan Current plan remains appropriate  PT Visit Diagnosis Unsteadiness on feet (R26.81);Muscle weakness (generalized) (M62.81);Difficulty in walking, not elsewhere classified (R26.2);Pain  Pain - Right/Left Right  Pain - part of body Hip  PT Frequency (ACUTE ONLY) Min 5X/week  Follow Up Recommendations Home health PT;Supervision for mobility/OOB  PT equipment Rolling walker with 5" wheels;3in1 (PT)  AM-PAC PT "6 Clicks" Mobility Outcome Measure (Version 2)  Help needed turning from your back to your side while in a flat bed without using bedrails? 4  Help needed moving from lying on your back to sitting on the side of a flat bed without using bedrails? 4  Help  needed moving to and from  a bed to a chair (including a wheelchair)? 3  Help needed standing up from a chair using your arms (e.g., wheelchair or bedside chair)? 3  Help needed to walk in hospital room? 3  Help needed climbing 3-5 steps with a railing?  3  6 Click Score 20  Consider Recommendation of Discharge To: Home with no services  PT Goal Progression  Progress towards PT goals Progressing toward goals  Acute Rehab PT Goals  PT Goal Formulation With patient  Time For Goal Achievement 03/05/19  Potential to Achieve Goals Good  PT Time Calculation  PT Start Time (ACUTE ONLY) 1528  PT Stop Time (ACUTE ONLY) 1558  PT Time Calculation (min) (ACUTE ONLY) 30 min  PT General Charges  $$ ACUTE PT VISIT 1 Visit  PT Treatments  $Gait Training 23-37 mins   Pt progressing towards goals. Performed stair training this session and pt with no LOB. Required min guard A for safety. Gave handout for home education. Pt very eager to return home today. Current recommendations appropriate. Will continue to follow acutely to maximize functional mobility independence and safety.   Leighton Ruff, PT, DPT  Acute Rehabilitation Services  Pager: [phone] Office: [phone]

## 2019-02-21 NOTE — Discharge Instructions (Signed)
INSTRUCTIONS AFTER JOINT REPLACEMENT  ° °o Remove items at home which could result in a fall. This includes throw rugs or furniture in walking pathways °o ICE to the affected joint every three hours while awake for 30 minutes at a time, for at least the first 3-5 days, and then as needed for pain and swelling.  Continue to use ice for pain and swelling. You may notice swelling that will progress down to the foot and ankle.  This is normal after surgery.  Elevate your leg when you are not up walking on it.   °o Continue to use the breathing machine you got in the hospital (incentive spirometer) which will help keep your temperature down.  It is common for your temperature to cycle up and down following surgery, especially at night when you are not up moving around and exerting yourself.  The breathing machine keeps your lungs expanded and your temperature down. ° ° °DIET:  As you were doing prior to hospitalization, we recommend a well-balanced diet. ° °DRESSING / WOUND CARE / SHOWERING ° °You may change your dressing 3-5 days after surgery.  Then change the dressing every day with sterile gauze.  Please use good hand washing techniques before changing the dressing.  Do not use any lotions or creams on the incision until instructed by your surgeon. ° °ACTIVITY ° °o Increase activity slowly as tolerated, but follow the weight bearing instructions below.   °o No driving for 6 weeks or until further direction given by your physician.  You cannot drive while taking narcotics.  °o No lifting or carrying greater than 10 lbs. until further directed by your surgeon. °o Avoid periods of inactivity such as sitting longer than an hour when not asleep. This helps prevent blood clots.  °o You may return to work once you are authorized by your doctor.  ° ° ° °WEIGHT BEARING  ° °Weight bearing as tolerated with assist device (walker, cane, etc) as directed, use it as long as suggested by your surgeon or therapist, typically at  least 4-6 weeks. ° ° °EXERCISES ° °Results after joint replacement surgery are often greatly improved when you follow the exercise, range of motion and muscle strengthening exercises prescribed by your doctor. Safety measures are also important to protect the joint from further injury. Any time any of these exercises cause you to have increased pain or swelling, decrease what you are doing until you are comfortable again and then slowly increase them. If you have problems or questions, call your caregiver or physical therapist for advice.  ° °Rehabilitation is important following a joint replacement. After just a few days of immobilization, the muscles of the leg can become weakened and shrink (atrophy).  These exercises are designed to build up the tone and strength of the thigh and leg muscles and to improve motion. Often times heat used for twenty to thirty minutes before working out will loosen up your tissues and help with improving the range of motion but do not use heat for the first two weeks following surgery (sometimes heat can increase post-operative swelling).  ° °These exercises can be done on a training (exercise) mat, on the floor, on a table or on a bed. Use whatever works the best and is most comfortable for you.    Use music or television while you are exercising so that the exercises are a pleasant break in your day. This will make your life better with the exercises acting as a break   in your routine that you can look forward to.   Perform all exercises about fifteen times, three times per day or as directed.  You should exercise both the operative leg and the other leg as well. ° °Exercises include: °  °• Quad Sets - Tighten up the muscle on the front of the thigh (Quad) and hold for 5-10 seconds.   °• Straight Leg Raises - With your knee straight (if you were given a brace, keep it on), lift the leg to 60 degrees, hold for 3 seconds, and slowly lower the leg.  Perform this exercise against  resistance later as your leg gets stronger.  °• Leg Slides: Lying on your back, slowly slide your foot toward your buttocks, bending your knee up off the floor (only go as far as is comfortable). Then slowly slide your foot back down until your leg is flat on the floor again.  °• Angel Wings: Lying on your back spread your legs to the side as far apart as you can without causing discomfort.  °• Hamstring Strength:  Lying on your back, push your heel against the floor with your leg straight by tightening up the muscles of your buttocks.  Repeat, but this time bend your knee to a comfortable angle, and push your heel against the floor.  You may put a pillow under the heel to make it more comfortable if necessary.  ° °A rehabilitation program following joint replacement surgery can speed recovery and prevent re-injury in the future due to weakened muscles. Contact your doctor or a physical therapist for more information on knee rehabilitation.  ° ° °CONSTIPATION ° °Constipation is defined medically as fewer than three stools per week and severe constipation as less than one stool per week.  Even if you have a regular bowel pattern at home, your normal regimen is likely to be disrupted due to multiple reasons following surgery.  Combination of anesthesia, postoperative narcotics, change in appetite and fluid intake all can affect your bowels.  ° °YOU MUST use at least one of the following options; they are listed in order of increasing strength to get the job done.  They are all available over the counter, and you may need to use some, POSSIBLY even all of these options:   ° °Drink plenty of fluids (prune juice may be helpful) and high fiber foods °Colace 100 mg by mouth twice a day  °Senokot for constipation as directed and as needed Dulcolax (bisacodyl), take with full glass of water  °Miralax (polyethylene glycol) once or twice a day as needed. ° °If you have tried all these things and are unable to have a bowel  movement in the first 3-4 days after surgery call either your surgeon or your primary doctor.   ° °If you experience loose stools or diarrhea, hold the medications until you stool forms back up.  If your symptoms do not get better within 1 week or if they get worse, check with your doctor.  If you experience "the worst abdominal pain ever" or develop nausea or vomiting, please contact the office immediately for further recommendations for treatment. ° ° °ITCHING:  If you experience itching with your medications, try taking only a single pain pill, or even half a pain pill at a time.  You can also use Benadryl over the counter for itching or also to help with sleep.  ° °TED HOSE STOCKINGS:  Use stockings on both legs until for at least 2 weeks or as   directed by physician office. They may be removed at night for sleeping.  MEDICATIONS:  See your medication summary on the After Visit Summary that nursing will review with you.  You may have some home medications which will be placed on hold until you complete the course of blood thinner medication.  It is important for you to complete the blood thinner medication as prescribed.  PRECAUTIONS:  If you experience chest pain or shortness of breath - call 911 immediately for transfer to the hospital emergency department.   If you develop a fever greater that 101 F, purulent drainage from wound, increased redness or drainage from wound, foul odor from the wound/dressing, or calf pain - CONTACT YOUR SURGEON.                                                   FOLLOW-UP APPOINTMENTS:  If you do not already have a post-op appointment, please call the office for an appointment to be seen by your surgeon.  Guidelines for how soon to be seen are listed in your After Visit Summary, but are typically between 1-4 weeks after surgery.  OTHER INSTRUCTIONS:   Knee Replacement:  Do not place pillow under knee, focus on keeping the knee straight while resting. CPM  instructions: 0-90 degrees, 2 hours in the morning, 2 hours in the afternoon, and 2 hours in the evening. Place foam block, curve side up under heel at all times except when in CPM or when walking.  DO NOT modify, tear, cut, or change the foam block in any way.  MAKE SURE YOU:   Understand these instructions.   Get help right away if you are not doing well or get worse.    Thank you for letting us be a part of your medical care team.  It is a privilege we respect greatly.  We hope these instructions will help you stay on track for a fast and full recovery!   Information on my medicine - ELIQUIS (apixaban)  Why was Eliquis prescribed for you? Eliquis was prescribed for you to reduce the risk of forming blood clots that can cause a stroke if you have a medical condition called atrial fibrillation (a type of irregular heartbeat) OR to reduce the risk of a blood clots forming after orthopedic surgery.  What do You need to know about Eliquis ? Take your Eliquis TWICE DAILY - one tablet in the morning and one tablet in the evening with or without food.  It would be best to take the doses about the same time each day.  If you have difficulty swallowing the tablet whole please discuss with your pharmacist how to take the medication safely.  Take Eliquis exactly as prescribed by your doctor and DO NOT stop taking Eliquis without talking to the doctor who prescribed the medication.  Stopping may increase your risk of developing a new clot or stroke.  Refill your prescription before you run out.  After discharge, you should have regular check-up appointments with your healthcare provider that is prescribing your Eliquis.  In the future your dose may need to be changed if your kidney function or weight changes by a significant amount or as you get older.  What do you do if you miss a dose? If you miss a dose, take it as soon as you remember on  the same day and resume taking twice daily.  Do not  take more than one dose of ELIQUIS at the same time.  Important Safety Information A possible side effect of Eliquis is bleeding. You should call your healthcare provider right away if you experience any of the following: ? Bleeding from an injury or your nose that does not stop. ? Unusual colored urine (red or dark brown) or unusual colored stools (red or black). ? Unusual bruising for unknown reasons. ? A serious fall or if you hit your head (even if there is no bleeding).  Some medicines may interact with Eliquis and might increase your risk of bleeding or clotting while on Eliquis. To help avoid this, consult your healthcare provider or pharmacist prior to using any new prescription or non-prescription medications, including herbals, vitamins, non-steroidal anti-inflammatory drugs (NSAIDs) and supplements.  This website has more information on Eliquis (apixaban): [email or web address].

## 2019-02-22 DIAGNOSIS — G5603 Carpal tunnel syndrome, bilateral upper limbs: Secondary | ICD-10-CM | POA: Diagnosis not present

## 2019-02-22 DIAGNOSIS — M84451D Pathological fracture, right femur, subsequent encounter for fracture with routine healing: Secondary | ICD-10-CM | POA: Diagnosis not present

## 2019-02-22 DIAGNOSIS — G43909 Migraine, unspecified, not intractable, without status migrainosus: Secondary | ICD-10-CM | POA: Diagnosis not present

## 2019-02-22 DIAGNOSIS — E039 Hypothyroidism, unspecified: Secondary | ICD-10-CM | POA: Diagnosis not present

## 2019-02-22 DIAGNOSIS — G4730 Sleep apnea, unspecified: Secondary | ICD-10-CM | POA: Diagnosis not present

## 2019-02-22 DIAGNOSIS — R634 Abnormal weight loss: Secondary | ICD-10-CM | POA: Diagnosis not present

## 2019-02-22 DIAGNOSIS — N183 Chronic kidney disease, stage 3 (moderate): Secondary | ICD-10-CM | POA: Diagnosis not present

## 2019-02-22 DIAGNOSIS — M4807 Spinal stenosis, lumbosacral region: Secondary | ICD-10-CM | POA: Diagnosis not present

## 2019-02-22 DIAGNOSIS — I872 Venous insufficiency (chronic) (peripheral): Secondary | ICD-10-CM | POA: Diagnosis not present

## 2019-02-22 DIAGNOSIS — Z85820 Personal history of malignant melanoma of skin: Secondary | ICD-10-CM | POA: Diagnosis not present

## 2019-02-22 DIAGNOSIS — F411 Generalized anxiety disorder: Secondary | ICD-10-CM | POA: Diagnosis not present

## 2019-02-22 DIAGNOSIS — I509 Heart failure, unspecified: Secondary | ICD-10-CM | POA: Diagnosis not present

## 2019-02-22 DIAGNOSIS — M1611 Unilateral primary osteoarthritis, right hip: Secondary | ICD-10-CM | POA: Diagnosis not present

## 2019-02-22 DIAGNOSIS — I130 Hypertensive heart and chronic kidney disease with heart failure and stage 1 through stage 4 chronic kidney disease, or unspecified chronic kidney disease: Secondary | ICD-10-CM | POA: Diagnosis not present

## 2019-02-22 DIAGNOSIS — M797 Fibromyalgia: Secondary | ICD-10-CM | POA: Diagnosis not present

## 2019-02-22 DIAGNOSIS — Z6822 Body mass index (BMI) 22.0-22.9, adult: Secondary | ICD-10-CM | POA: Diagnosis not present

## 2019-02-22 DIAGNOSIS — Z85118 Personal history of other malignant neoplasm of bronchus and lung: Secondary | ICD-10-CM | POA: Diagnosis not present

## 2019-02-22 DIAGNOSIS — Z87891 Personal history of nicotine dependence: Secondary | ICD-10-CM | POA: Diagnosis not present

## 2019-02-22 DIAGNOSIS — G40909 Epilepsy, unspecified, not intractable, without status epilepticus: Secondary | ICD-10-CM | POA: Diagnosis not present

## 2019-02-22 DIAGNOSIS — D631 Anemia in chronic kidney disease: Secondary | ICD-10-CM | POA: Diagnosis not present

## 2019-02-22 DIAGNOSIS — M47816 Spondylosis without myelopathy or radiculopathy, lumbar region: Secondary | ICD-10-CM | POA: Diagnosis not present

## 2019-02-22 DIAGNOSIS — F329 Major depressive disorder, single episode, unspecified: Secondary | ICD-10-CM | POA: Diagnosis not present

## 2019-02-22 DIAGNOSIS — G894 Chronic pain syndrome: Secondary | ICD-10-CM | POA: Diagnosis not present

## 2019-02-22 DIAGNOSIS — Z8543 Personal history of malignant neoplasm of ovary: Secondary | ICD-10-CM | POA: Diagnosis not present

## 2019-02-22 DIAGNOSIS — Z85528 Personal history of other malignant neoplasm of kidney: Secondary | ICD-10-CM | POA: Diagnosis not present

## 2019-02-22 LAB — TYPE AND SCREEN
ABO/RH(D): AB POS
Antibody Screen: NEGATIVE
Unit division: 0

## 2019-02-22 LAB — BPAM RBC
Blood Product Expiration Date: 202005052359
ISSUE DATE / TIME: 202005011232
Unit Type and Rh: 6200

## 2019-02-25 DIAGNOSIS — I130 Hypertensive heart and chronic kidney disease with heart failure and stage 1 through stage 4 chronic kidney disease, or unspecified chronic kidney disease: Secondary | ICD-10-CM | POA: Diagnosis not present

## 2019-02-25 DIAGNOSIS — D631 Anemia in chronic kidney disease: Secondary | ICD-10-CM | POA: Diagnosis not present

## 2019-02-25 DIAGNOSIS — N183 Chronic kidney disease, stage 3 (moderate): Secondary | ICD-10-CM | POA: Diagnosis not present

## 2019-02-25 DIAGNOSIS — M1611 Unilateral primary osteoarthritis, right hip: Secondary | ICD-10-CM | POA: Diagnosis not present

## 2019-02-25 DIAGNOSIS — I509 Heart failure, unspecified: Secondary | ICD-10-CM | POA: Diagnosis not present

## 2019-02-25 DIAGNOSIS — M84451D Pathological fracture, right femur, subsequent encounter for fracture with routine healing: Secondary | ICD-10-CM | POA: Diagnosis not present

## 2019-02-26 ENCOUNTER — Encounter (HOSPITAL_COMMUNITY): Payer: Self-pay | Admitting: Orthopedic Surgery

## 2019-02-26 DIAGNOSIS — I130 Hypertensive heart and chronic kidney disease with heart failure and stage 1 through stage 4 chronic kidney disease, or unspecified chronic kidney disease: Secondary | ICD-10-CM | POA: Diagnosis not present

## 2019-02-26 DIAGNOSIS — M84451D Pathological fracture, right femur, subsequent encounter for fracture with routine healing: Secondary | ICD-10-CM | POA: Diagnosis not present

## 2019-02-26 DIAGNOSIS — M1611 Unilateral primary osteoarthritis, right hip: Secondary | ICD-10-CM | POA: Diagnosis not present

## 2019-02-26 DIAGNOSIS — N183 Chronic kidney disease, stage 3 (moderate): Secondary | ICD-10-CM | POA: Diagnosis not present

## 2019-02-26 DIAGNOSIS — D631 Anemia in chronic kidney disease: Secondary | ICD-10-CM | POA: Diagnosis not present

## 2019-02-26 DIAGNOSIS — I509 Heart failure, unspecified: Secondary | ICD-10-CM | POA: Diagnosis not present

## 2019-02-28 DIAGNOSIS — I130 Hypertensive heart and chronic kidney disease with heart failure and stage 1 through stage 4 chronic kidney disease, or unspecified chronic kidney disease: Secondary | ICD-10-CM | POA: Diagnosis not present

## 2019-02-28 DIAGNOSIS — D631 Anemia in chronic kidney disease: Secondary | ICD-10-CM | POA: Diagnosis not present

## 2019-02-28 DIAGNOSIS — M1611 Unilateral primary osteoarthritis, right hip: Secondary | ICD-10-CM | POA: Diagnosis not present

## 2019-02-28 DIAGNOSIS — M84451D Pathological fracture, right femur, subsequent encounter for fracture with routine healing: Secondary | ICD-10-CM | POA: Diagnosis not present

## 2019-02-28 DIAGNOSIS — N183 Chronic kidney disease, stage 3 (moderate): Secondary | ICD-10-CM | POA: Diagnosis not present

## 2019-02-28 DIAGNOSIS — I509 Heart failure, unspecified: Secondary | ICD-10-CM | POA: Diagnosis not present

## 2019-03-03 DIAGNOSIS — M25551 Pain in right hip: Secondary | ICD-10-CM | POA: Diagnosis not present

## 2019-03-03 DIAGNOSIS — S72001D Fracture of unspecified part of neck of right femur, subsequent encounter for closed fracture with routine healing: Secondary | ICD-10-CM | POA: Diagnosis not present

## 2019-03-04 DIAGNOSIS — I130 Hypertensive heart and chronic kidney disease with heart failure and stage 1 through stage 4 chronic kidney disease, or unspecified chronic kidney disease: Secondary | ICD-10-CM | POA: Diagnosis not present

## 2019-03-04 DIAGNOSIS — M84451D Pathological fracture, right femur, subsequent encounter for fracture with routine healing: Secondary | ICD-10-CM | POA: Diagnosis not present

## 2019-03-04 DIAGNOSIS — M1611 Unilateral primary osteoarthritis, right hip: Secondary | ICD-10-CM | POA: Diagnosis not present

## 2019-03-04 DIAGNOSIS — D631 Anemia in chronic kidney disease: Secondary | ICD-10-CM | POA: Diagnosis not present

## 2019-03-04 DIAGNOSIS — I509 Heart failure, unspecified: Secondary | ICD-10-CM | POA: Diagnosis not present

## 2019-03-04 DIAGNOSIS — N183 Chronic kidney disease, stage 3 (moderate): Secondary | ICD-10-CM | POA: Diagnosis not present

## 2019-03-05 DIAGNOSIS — C184 Malignant neoplasm of transverse colon: Secondary | ICD-10-CM | POA: Diagnosis not present

## 2019-03-05 DIAGNOSIS — I509 Heart failure, unspecified: Secondary | ICD-10-CM | POA: Diagnosis not present

## 2019-03-05 DIAGNOSIS — M84451D Pathological fracture, right femur, subsequent encounter for fracture with routine healing: Secondary | ICD-10-CM | POA: Diagnosis not present

## 2019-03-05 DIAGNOSIS — S72001A Fracture of unspecified part of neck of right femur, initial encounter for closed fracture: Secondary | ICD-10-CM | POA: Diagnosis not present

## 2019-03-05 DIAGNOSIS — N183 Chronic kidney disease, stage 3 (moderate): Secondary | ICD-10-CM | POA: Diagnosis not present

## 2019-03-05 DIAGNOSIS — G894 Chronic pain syndrome: Secondary | ICD-10-CM | POA: Diagnosis not present

## 2019-03-05 DIAGNOSIS — M1611 Unilateral primary osteoarthritis, right hip: Secondary | ICD-10-CM | POA: Diagnosis not present

## 2019-03-05 DIAGNOSIS — I130 Hypertensive heart and chronic kidney disease with heart failure and stage 1 through stage 4 chronic kidney disease, or unspecified chronic kidney disease: Secondary | ICD-10-CM | POA: Diagnosis not present

## 2019-03-05 DIAGNOSIS — D631 Anemia in chronic kidney disease: Secondary | ICD-10-CM | POA: Diagnosis not present

## 2019-03-07 DIAGNOSIS — I509 Heart failure, unspecified: Secondary | ICD-10-CM | POA: Diagnosis not present

## 2019-03-07 DIAGNOSIS — D631 Anemia in chronic kidney disease: Secondary | ICD-10-CM | POA: Diagnosis not present

## 2019-03-07 DIAGNOSIS — M84451D Pathological fracture, right femur, subsequent encounter for fracture with routine healing: Secondary | ICD-10-CM | POA: Diagnosis not present

## 2019-03-07 DIAGNOSIS — M1611 Unilateral primary osteoarthritis, right hip: Secondary | ICD-10-CM | POA: Diagnosis not present

## 2019-03-07 DIAGNOSIS — N183 Chronic kidney disease, stage 3 (moderate): Secondary | ICD-10-CM | POA: Diagnosis not present

## 2019-03-07 DIAGNOSIS — I130 Hypertensive heart and chronic kidney disease with heart failure and stage 1 through stage 4 chronic kidney disease, or unspecified chronic kidney disease: Secondary | ICD-10-CM | POA: Diagnosis not present

## 2019-03-23 ENCOUNTER — Emergency Department (HOSPITAL_COMMUNITY): Payer: Medicare Other

## 2019-03-23 ENCOUNTER — Encounter (HOSPITAL_COMMUNITY): Payer: Self-pay | Admitting: Emergency Medicine

## 2019-03-23 ENCOUNTER — Other Ambulatory Visit: Payer: Self-pay

## 2019-03-23 ENCOUNTER — Emergency Department (HOSPITAL_COMMUNITY)
Admission: EM | Admit: 2019-03-23 | Discharge: 2019-03-23 | Disposition: A | Discharge status: Home / Self Care | Payer: Medicare Other | Source: Home / Self Care | Attending: Emergency Medicine | Admitting: Emergency Medicine

## 2019-03-23 DIAGNOSIS — T07XXXA Unspecified multiple injuries, initial encounter: Secondary | ICD-10-CM | POA: Diagnosis not present

## 2019-03-23 DIAGNOSIS — R000 Tachycardia, unspecified: Secondary | ICD-10-CM | POA: Diagnosis not present

## 2019-03-23 DIAGNOSIS — Z85118 Personal history of other malignant neoplasm of bronchus and lung: Secondary | ICD-10-CM | POA: Insufficient documentation

## 2019-03-23 DIAGNOSIS — S73004A Unspecified dislocation of right hip, initial encounter: Secondary | ICD-10-CM | POA: Diagnosis not present

## 2019-03-23 DIAGNOSIS — S73001A Unspecified subluxation of right hip, initial encounter: Secondary | ICD-10-CM | POA: Diagnosis not present

## 2019-03-23 DIAGNOSIS — E039 Hypothyroidism, unspecified: Secondary | ICD-10-CM | POA: Insufficient documentation

## 2019-03-23 DIAGNOSIS — X500XXA Overexertion from strenuous movement or load, initial encounter: Secondary | ICD-10-CM | POA: Insufficient documentation

## 2019-03-23 DIAGNOSIS — Z85828 Personal history of other malignant neoplasm of skin: Secondary | ICD-10-CM | POA: Insufficient documentation

## 2019-03-23 DIAGNOSIS — M25551 Pain in right hip: Secondary | ICD-10-CM | POA: Diagnosis not present

## 2019-03-23 DIAGNOSIS — Z96641 Presence of right artificial hip joint: Secondary | ICD-10-CM | POA: Insufficient documentation

## 2019-03-23 DIAGNOSIS — D62 Acute posthemorrhagic anemia: Secondary | ICD-10-CM | POA: Diagnosis not present

## 2019-03-23 DIAGNOSIS — Y92008 Other place in unspecified non-institutional (private) residence as the place of occurrence of the external cause: Secondary | ICD-10-CM | POA: Insufficient documentation

## 2019-03-23 DIAGNOSIS — T84020A Dislocation of internal right hip prosthesis, initial encounter: Secondary | ICD-10-CM | POA: Diagnosis not present

## 2019-03-23 DIAGNOSIS — Z20828 Contact with and (suspected) exposure to other viral communicable diseases: Secondary | ICD-10-CM | POA: Insufficient documentation

## 2019-03-23 DIAGNOSIS — I130 Hypertensive heart and chronic kidney disease with heart failure and stage 1 through stage 4 chronic kidney disease, or unspecified chronic kidney disease: Secondary | ICD-10-CM | POA: Diagnosis not present

## 2019-03-23 DIAGNOSIS — Y9389 Activity, other specified: Secondary | ICD-10-CM | POA: Insufficient documentation

## 2019-03-23 DIAGNOSIS — M9701XA Periprosthetic fracture around internal prosthetic right hip joint, initial encounter: Secondary | ICD-10-CM | POA: Diagnosis not present

## 2019-03-23 DIAGNOSIS — Z96612 Presence of left artificial shoulder joint: Secondary | ICD-10-CM | POA: Insufficient documentation

## 2019-03-23 DIAGNOSIS — F419 Anxiety disorder, unspecified: Secondary | ICD-10-CM | POA: Diagnosis not present

## 2019-03-23 DIAGNOSIS — Y999 Unspecified external cause status: Secondary | ICD-10-CM | POA: Insufficient documentation

## 2019-03-23 DIAGNOSIS — R52 Pain, unspecified: Secondary | ICD-10-CM

## 2019-03-23 DIAGNOSIS — I5020 Unspecified systolic (congestive) heart failure: Secondary | ICD-10-CM | POA: Diagnosis not present

## 2019-03-23 DIAGNOSIS — Z8543 Personal history of malignant neoplasm of ovary: Secondary | ICD-10-CM | POA: Insufficient documentation

## 2019-03-23 DIAGNOSIS — W19XXXA Unspecified fall, initial encounter: Secondary | ICD-10-CM | POA: Diagnosis not present

## 2019-03-23 DIAGNOSIS — S73014A Posterior dislocation of right hip, initial encounter: Secondary | ICD-10-CM | POA: Insufficient documentation

## 2019-03-23 DIAGNOSIS — Z96653 Presence of artificial knee joint, bilateral: Secondary | ICD-10-CM | POA: Insufficient documentation

## 2019-03-23 DIAGNOSIS — Z1159 Encounter for screening for other viral diseases: Secondary | ICD-10-CM | POA: Diagnosis not present

## 2019-03-23 DIAGNOSIS — N189 Chronic kidney disease, unspecified: Secondary | ICD-10-CM | POA: Insufficient documentation

## 2019-03-23 DIAGNOSIS — I509 Heart failure, unspecified: Secondary | ICD-10-CM | POA: Insufficient documentation

## 2019-03-23 DIAGNOSIS — I10 Essential (primary) hypertension: Secondary | ICD-10-CM | POA: Diagnosis not present

## 2019-03-23 DIAGNOSIS — T84020D Dislocation of internal right hip prosthesis, subsequent encounter: Secondary | ICD-10-CM | POA: Diagnosis not present

## 2019-03-23 DIAGNOSIS — Z471 Aftercare following joint replacement surgery: Secondary | ICD-10-CM | POA: Diagnosis not present

## 2019-03-23 DIAGNOSIS — Z87891 Personal history of nicotine dependence: Secondary | ICD-10-CM | POA: Insufficient documentation

## 2019-03-23 DIAGNOSIS — Z03818 Encounter for observation for suspected exposure to other biological agents ruled out: Secondary | ICD-10-CM | POA: Diagnosis not present

## 2019-03-23 LAB — SARS CORONAVIRUS 2 BY RT PCR (HOSPITAL ORDER, PERFORMED IN ~~LOC~~ HOSPITAL LAB): SARS Coronavirus 2: NEGATIVE

## 2019-03-23 MED ORDER — PROPOFOL 10 MG/ML IV BOLUS
INTRAVENOUS | Status: AC | PRN
  Administered 2019-03-23 (×2): 10 mg via INTRAVENOUS
  Administered 2019-03-23: 30 mg via INTRAVENOUS
  Administered 2019-03-23: 20 mg via INTRAVENOUS
  Administered 2019-03-23 (×2): 30 mg via INTRAVENOUS

## 2019-03-23 MED ORDER — MORPHINE SULFATE (PF) 4 MG/ML IV SOLN
4.0000 mg | Freq: Once | INTRAVENOUS | Status: AC
  Administered 2019-03-23: 4 mg via INTRAVENOUS
  Filled 2019-03-23: qty 1

## 2019-03-23 MED ORDER — PROPOFOL 10 MG/ML IV BOLUS
0.5000 mg/kg | Freq: Once | INTRAVENOUS | Status: DC
  Filled 2019-03-23: qty 20

## 2019-03-23 MED ORDER — OXYCODONE-ACETAMINOPHEN 5-325 MG PO TABS: 1.0000 | ORAL_TABLET | Freq: Four times a day (QID) | ORAL | 0 refills | Status: DC | PRN

## 2019-03-23 NOTE — Discharge Instructions (Signed)
Is referred back to your postoperative hip precautions and follow these.  You may put weight on the right hip but do not do so without your knee immobilizer in place.  Please call your orthopedic surgeon tomorrow to schedule a follow-up appointment.  I have called in a short course of pain medication to your listed outpatient pharmacy.  Return to the emergency department with any new or suddenly worsening symptoms.

## 2019-03-23 NOTE — ED Notes (Signed)
Patient reports pain is severe after getting back from x-ray, asking for more pain medication. MD aware and at bedside.

## 2019-03-23 NOTE — ED Notes (Signed)
Daughter, Archie Patten- [phone] for updates

## 2019-03-23 NOTE — Progress Notes (Signed)
Orthopedic Tech Progress Note Patient Details:  [name] [birth year] [id number]  Ortho Devices Type of Ortho Device: Knee Immobilizer Ortho Device/Splint Location: rle Ortho Device/Splint Interventions: Ordered, Application, Adjustment   Post Interventions Patient Tolerated: Well Instructions Provided: Care of device, Adjustment of device   Karolee Stamps 03/23/2019, 7:52 PM

## 2019-03-23 NOTE — Care Management (Signed)
ED CM received return call from [facility] concerning the delivery of w/c to the ED today.  As per [employer] the are still checking to see if this can be processed tonight, but most likely not, and will be processed tomorrow. Updated [facility] RN on [employer].

## 2019-03-23 NOTE — ED Notes (Signed)
Informed consent for procedural sedation and closed reduction of R hip dislocation signed and at bedside.

## 2019-03-23 NOTE — Care Management (Addendum)
ED CM noted CM consult for DME light weight wheel chair.  Patient is s/p fall, [facility] liaison DME person has left for the day,  CM faxed orders and  placed call to intake to see if w/c can be delivered to the ED today. Update Optician, dispensing on [facility]. CM awaiting return call.

## 2019-03-23 NOTE — ED Triage Notes (Signed)
Per [facility] pt coming from home. States she was standing on her porch putting her sock when she lost her balance and fell. Patient reports hearing a pop in her right leg. shortening and rotation noted. Patient given 150 mcg fentanyl en route with no relief.

## 2019-03-23 NOTE — ED Provider Notes (Signed)
Emergency Department Provider Note   I have reviewed the triage vital signs and the nursing notes.   HISTORY  Chief Complaint Fall   HPI [name] is a 68 y.o. female with PMH of CHF, CKD, and anxiety presents to the emergency department for evaluation of right hip pain.  Patient was on her front porch and bending over to tie her shoes when she felt like her right leg popped.  She felt severe pain and was unable to get up.  Denies any numbness or tingling in the leg.  EMS was called and transported the patient.  She was given fentanyl in route but states that has not improved her pain symptoms.  She denies pain in the knee or ankle.  No head trauma.  Patient is not anticoagulated. Pain is severe and worse than normal.    Past Medical History:  Diagnosis Date   Anemia    Anxiety    Arthritis    "everywhere" (08/19/2013)   Bilateral carpal tunnel syndrome    Chest pain at rest, rule out pericarditis 11/27/2012   CHF (congestive heart failure) ([place])    Chronic kidney disease    dr Risa Grill   Chronic lower back pain    Closed displaced fracture of right femoral neck ([place]) 16/07/9603   Complication of anesthesia    "woke up twice during knee replacement" (08/19/2013)   Depression    Dyspnea    w/ exertion     Fibromyalgia    Headache(784.0)    hx of migraines    History of pericarditis, with pericardial window in 2009 11/27/2012   Hx of ovarian cancer 11/27/2012   Hypothyroidism 11/27/2012   Iron deficiency anemia    Kidney carcinoma ([place])    "both kidneys; ~ 3 yr apart" (08/19/2013)   Lung cancer ([place]) 2018   Melanoma of back ([place])    Migraines    Pneumonia    "several times; including today", RUL/notes 08/19/2013   Squamous carcinoma    "face, side of my nose, chest; used acid to get rid of them" (08/19/2013)   Venous insufficiency 11/27/2012    Patient Active Problem List   Diagnosis Date Noted   Closed displaced fracture of right femoral  neck ([place]) 02/17/2019   Leukocytosis 02/17/2019   Normocytic normochromic anemia 02/17/2019   Essential hypertension 02/17/2019   Malnutrition of moderate degree 12/06/2018   Pressure injury of skin 12/05/2018   Hypomagnesemia 12/04/2018   Dehydration 12/04/2018   Weight loss 12/04/2018   Colitis 12/04/2018   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   Benzodiazepine withdrawal without complication ([place]) 54/06/8118   Hypokalemia 03/13/2018   Acute on chronic renal insufficiency 03/13/2018   Generalized anxiety disorder 03/13/2018   Acute cognitive decline 03/13/2018   Adenocarcinoma of right lung, stage 1 ([place]) 10/22/2017   Chronic pain syndrome 07/25/2017   S/P left TKA 08/02/2015   S/P knee replacement 08/02/2015   Carpal tunnel syndrome 04/23/2015   Autoimmune disease ([place]) 03/29/2013   Apnea, sleep 03/29/2013   Anxiety 11/28/2012   Seizure disorder ([place]) 11/28/2012   Anxiety state 11/28/2012   History of pericarditis, with pericardial window in 2009, and recurrent episodes 2011,2013, treated with methotrexate 11/27/2012   SOB (shortness of breath) 11/27/2012   Hypothyroidism 11/27/2012   History of partial nephrectomy, both rt. and lt 11/27/2012   Hx of ovarian cancer 11/27/2012   Venous insufficiency 11/27/2012   Chronic back pain, hx of lumbar spondylosis and stenosis L5-S1 with hx  decompression and total diskectomy 11/27/2012   Hx of melanoma of skin 11/27/2012   Hx of total knee replacement, rt 11/27/2012   Chronic venous insufficiency 11/27/2012   DIARRHEA-PRESUMED INFECTIOUS 12/27/2009    Past Surgical History:  Procedure Laterality Date   ABDOMINAL HYSTERECTOMY  1979   ANTERIOR LAT LUMBAR FUSION Left 07/16/2018   Procedure: Left Lumbar two-three  Anterolateral decompression/interbody fusion with lateral plate fixation;  Surgeon: Kristeen Miss, MD;  Location: [facility];  Service: Neurosurgery;  Laterality: Left;    APPENDECTOMY     DOPPLER ECHOCARDIOGRAPHY  02/23/2011   LVEF>50%   FRACTURE SURGERY     KNEE ARTHROSCOPY Right    "twice before the replacement" (08/19/2013)   LEFT OOPHORECTOMY Left [place]   "ruptured disc"   MELANOMA EXCISION  ~ 2010   "my lower back" (08/19/2013)   ORIF HIP FRACTURE Left    x 2 age 25 and 4   PARTIAL NEPHRECTOMY Right 2005; ~ 2008   PERICARDIAL WINDOW  ~ 2012   [place]  2002?; 03/2012   REDUCTION MAMMAPLASTY Bilateral ~ 2008   RIGHT OOPHORECTOMY  ~ 2007   "it had cancer in it" (08/19/2013)   SHOULDER ARTHROSCOPY W/ ROTATOR CUFF REPAIR Right 1990's   TONSILLECTOMY  1954   TOTAL HIP ARTHROPLASTY Right 02/18/2019   Procedure: TOTAL HIP ARTHROPLASTY;  Surgeon: Marchia Bond, MD;  Location: [facility];  Service: Orthopedics;  Laterality: Right;   TOTAL KNEE ARTHROPLASTY Right 1990's   TOTAL KNEE ARTHROPLASTY Left 08/02/2015   Procedure: LEFT TOTAL KNEE ARTHROPLASTY;  Surgeon: Paralee Cancel, MD;  Location: [facility];  Service: Orthopedics;  Laterality: Left;   TOTAL SHOULDER REPLACEMENT Left 2006?   TUBAL LIGATION  1952   VIDEO ASSISTED THORACOSCOPY (VATS)/WEDGE RESECTION Right 09/28/2017   Procedure: RIGHT VIDEO ASSISTED THORACOSCOPY (VATS)/WEDGE RESECTION, LYMPH NODE DISSECTION ;  Surgeon: Melrose Nakayama, MD;  Location: [facility];  Service: Thoracic;  Laterality: Right;    Allergies Amoxicillin; Ampicillin; and Buspar [buspirone]  Family History  Problem Relation Age of Onset   Cancer Mother 9       Stomach   Cancer Father 6       Lung cancer   Bipolar disorder Sister     Social History Social History   Tobacco Use   Smoking status: Former Smoker    Packs/day: 1.50    Years: 18.00    Pack years: 27.00    Types: Cigarettes    Last attempt to quit: 09/22/1985    Years since quitting: 33.5   Smokeless tobacco: Never Used  Substance Use Topics   Alcohol use: No   Drug use: No    Comment:  prescribed    Review of Systems  Constitutional: No fever/chills Eyes: No visual changes. ENT: No sore throat. Cardiovascular: Denies chest pain. Respiratory: Denies shortness of breath. Gastrointestinal: No abdominal pain.  No nausea, no vomiting.  No diarrhea.  No constipation. Genitourinary: Negative for dysuria. Musculoskeletal: Negative for back pain. Positive right hip pain.  Skin: Negative for rash. Neurological: Negative for headaches, focal weakness or numbness.  10-point ROS otherwise negative.  ____________________________________________   PHYSICAL EXAM:  VITAL SIGNS: ED Triage Vitals  Enc Vitals Group     BP 03/23/19 1444 (!) 133/56     Pulse Rate 03/23/19 1444 82     Resp 03/23/19 1444 16     Temp 03/23/19 1444 98 F (36.7 C)     Temp Source  03/23/19 1444 Oral     SpO2 03/23/19 1441 99 %     Weight 03/23/19 1444 139 lb (63 kg)     Height 03/23/19 1444 5\' 6"  (1.676 m)     Pain Score 03/23/19 1443 10   Constitutional: Alert and oriented. Well appearing and in no acute distress. Eyes: Conjunctivae are normal.  Head: Atraumatic. Nose: No congestion/rhinnorhea. Mouth/Throat: Mucous membranes are moist.  Neck: No stridor.   Cardiovascular: Normal rate, regular rhythm. Good peripheral circulation. Grossly normal heart sounds.   Respiratory: Normal respiratory effort.  No retractions. Lungs CTAB. Gastrointestinal: Soft and nontender. No distention.  Musculoskeletal: Shortening of the RLE. Pain with ROM. No laceration. Neurovascularly intact RLE.  Neurologic:  Normal speech and language. No gross focal neurologic deficits are appreciated.  Skin:  Skin is warm, dry and intact. No rash noted.  ____________________________________________   LABS (all labs ordered are listed, but only abnormal results are displayed)  Labs Reviewed  SARS CORONAVIRUS 2 (HOSPITAL ORDER, [facility])    ____________________________________________  RADIOLOGY  Dg Chest 1 View  Result Date: 03/23/2019 CLINICAL DATA:  Right hip pain EXAM: CHEST  1 VIEW COMPARISON:  None. FINDINGS: Normal mediastinum and cardiac silhouette. Normal pulmonary vasculature. No evidence of effusion, infiltrate, or pneumothorax. No acute bony abnormality. LEFT shoulder arthroplasty IMPRESSION: No acute cardiopulmonary process. Electronically Signed   By: Suzy Bouchard M.D.   On: 03/23/2019 16:15   Dg Hip Unilat W Or Wo Pelvis 2-3 Views Right  Result Date: 03/23/2019 CLINICAL DATA:  Post right hip reduction  [id number] hip EXAM: DG HIP (WITH OR WITHOUT PELVIS) 2-3V RIGHT COMPARISON:  None. FINDINGS: Interval reduction of the RIGHT hip dislocation. The femoral prosthetic is now within the acetabular cup. IMPRESSION: Successful reduction RIGHT hip prosthetic dislocation. Electronically Signed   By: Suzy Bouchard M.D.   On: 03/23/2019 18:59   Dg Hip Unilat W Or Wo Pelvis 2-3 Views Right  Result Date: 03/23/2019 CLINICAL DATA:  Right hip pain EXAM: DG HIP (WITH OR WITHOUT PELVIS) 2-3V RIGHT COMPARISON:  02/18/2019 pelvic radiograph FINDINGS: Right total hip arthroplasty. Posterosuperior right hip dislocation. No evidence of hardware fracture or loosening. Partially visualized extensive hardware in the lower lumbar spine. No acute osseous fracture. No left hip malalignment on frontal view. No focal osseous lesions. IMPRESSION: Posterosuperior right hip dislocation. Right total hip arthroplasty. No acute osseous fracture. Electronically Signed   By: Ilona Sorrel M.D.   On: 03/23/2019 16:17    ____________________________________________   PROCEDURES  Procedure(s) performed:   Reduction of dislocation Date/Time: 03/23/2019 8:13 PM Performed by: Margette Fast, MD Authorized by: Margette Fast, MD  Consent: Written consent obtained. Risks and benefits: risks, benefits and alternatives were discussed Consent given  by: patient Required items: required blood products, implants, devices, and special equipment available Patient identity confirmed: verbally with patient and arm band Time out: Immediately prior to procedure a "time out" was called to verify the correct patient, procedure, equipment, support staff and site/side marked as required. Preparation: Patient was prepped and draped in the usual sterile fashion. Local anesthesia used: no  Anesthesia: Local anesthesia used: no  Sedation: Patient sedated: yes Sedatives: propofol Vitals: Vital signs were monitored during sedation.  Patient tolerance: Patient tolerated the procedure well with no immediate complications Comments: After adequate sedation achieved the patient's right knee was flexed.  Traction applied in an upward fashion with palpable reduction of the hip.  Range of motion under sedation is  full and hardware seems well seated.  Patient tolerated well.   .Sedation Date/Time: 03/23/2019 8:15 PM Performed by: Margette Fast, MD Authorized by: Margette Fast, MD   Consent:    Consent obtained:  Written   Consent given by:  Patient   Risks discussed:  Allergic reaction, dysrhythmia, inadequate sedation, nausea, prolonged hypoxia resulting in organ damage, prolonged sedation necessitating reversal, respiratory compromise necessitating ventilatory assistance and intubation and vomiting   Alternatives discussed:  Analgesia without sedation, anxiolysis and regional anesthesia Universal protocol:    Procedure explained and questions answered to patient or proxy's satisfaction: yes     Relevant documents present and verified: yes     Test results available and properly labeled: yes     Imaging studies available: yes     Required blood products, implants, devices, and special equipment available: yes     Site/side marked: yes     Immediately prior to procedure a time out was called: yes     Patient identity confirmation method:  Verbally  with patient Indications:    Procedure necessitating sedation performed by:  Physician performing sedation Pre-sedation assessment:    Time since last food or drink:  6 hours   ASA classification: class 1 - normal, healthy patient     Neck mobility: normal     Mouth opening:  3 or more finger widths   Thyromental distance:  4 finger widths   Mallampati score:  I - soft palate, uvula, fauces, pillars visible   Pre-sedation assessments completed and reviewed: airway patency, cardiovascular function, hydration status, mental status, nausea/vomiting, pain level, respiratory function and temperature   Immediate pre-procedure details:    Reassessment: Patient reassessed immediately prior to procedure     Reviewed: vital signs, relevant labs/tests and NPO status     Verified: bag valve mask available, emergency equipment available, intubation equipment available, IV patency confirmed, oxygen available and suction available   Procedure details (see MAR for exact dosages):    Preoxygenation:  Nasal cannula   Sedation:  Propofol   Analgesia:  Morphine   Intra-procedure monitoring:  Blood pressure monitoring, cardiac monitor, continuous pulse oximetry, frequent LOC assessments, frequent vital sign checks and continuous capnometry   Intra-procedure events: none     Total Provider sedation time (minutes):  30 Post-procedure details:    Attendance: Constant attendance by certified staff until patient recovered     Recovery: Patient returned to pre-procedure baseline     Post-sedation assessments completed and reviewed: airway patency, cardiovascular function, hydration status, mental status, nausea/vomiting, pain level, respiratory function and temperature     Patient is stable for discharge or admission: yes     Patient tolerance:  Tolerated well, no immediate complications     ____________________________________________   INITIAL IMPRESSION / ASSESSMENT AND PLAN / ED COURSE  Pertinent labs  & imaging results that were available during my care of the patient were reviewed by me and considered in my medical decision making (see chart for details).   Patient with right hip pain. Fracture vs dislocation clinically. Sending labs and imaging.   04:24 PM  Appears to have a right hip dislocation.  Discussed the plan with the patient to perform procedural sedation and attempt to relocate the hip.  Patient in agreement.  Plan for additional pain medicine now.   Hip dislocated. Able to reduce as above. Discussed with Dr. Griffin Basil. Advises hip precautions, knee immobilizer, WBAT with knee immobilizer, and call the office tomorrow. Patient recalls the hip  precautions and has print-out of this at home from recent surgery. Called in Rx for pain and discussed ED return precautions.    ____________________________________________  FINAL CLINICAL IMPRESSION(S) / ED DIAGNOSES  Final diagnoses:  Closed dislocation of right hip, initial encounter [facility])     MEDICATIONS GIVEN DURING THIS VISIT:  Medications  morphine 4 MG/ML injection 4 mg (4 mg Intravenous Given 03/23/19 1525)  morphine 4 MG/ML injection 4 mg (4 mg Intravenous Given 03/23/19 1649)  propofol (DIPRIVAN) 10 mg/mL bolus/IV push (30 mg Intravenous Given 03/23/19 1738)  morphine 4 MG/ML injection 4 mg (4 mg Intravenous Given 03/23/19 1822)     NEW OUTPATIENT MEDICATIONS STARTED DURING THIS VISIT:  Discharge Medication List as of 03/23/2019  7:55 PM    START taking these medications   Details  oxyCODONE-acetaminophen (PERCOCET/ROXICET) 5-325 MG tablet Take 1 tablet by mouth every 6 (six) hours as needed for severe pain., Starting Sun 03/23/2019, Normal        Note:  This document was prepared using Dragon voice recognition software and may include unintentional dictation errors.  Nanda Quinton, MD Emergency Medicine    Brytney Somes, [facility], MD 03/24/19 [phone]

## 2019-03-24 DIAGNOSIS — S73004A Unspecified dislocation of right hip, initial encounter: Secondary | ICD-10-CM | POA: Diagnosis not present

## 2019-03-25 ENCOUNTER — Encounter (HOSPITAL_COMMUNITY): Payer: Self-pay

## 2019-03-25 ENCOUNTER — Other Ambulatory Visit: Payer: Self-pay

## 2019-03-25 ENCOUNTER — Emergency Department (HOSPITAL_COMMUNITY): Payer: Medicare Other

## 2019-03-25 ENCOUNTER — Inpatient Hospital Stay (HOSPITAL_COMMUNITY)
Admission: EM | Admit: 2019-03-25 | Discharge: 2019-03-28 | DRG: 467 | Disposition: A | Discharge status: Home / Self Care | Payer: Medicare Other | Attending: Orthopedic Surgery | Admitting: Orthopedic Surgery

## 2019-03-25 DIAGNOSIS — I130 Hypertensive heart and chronic kidney disease with heart failure and stage 1 through stage 4 chronic kidney disease, or unspecified chronic kidney disease: Secondary | ICD-10-CM | POA: Diagnosis not present

## 2019-03-25 DIAGNOSIS — I10 Essential (primary) hypertension: Secondary | ICD-10-CM | POA: Diagnosis not present

## 2019-03-25 DIAGNOSIS — M130 Polyarthritis, unspecified: Secondary | ICD-10-CM | POA: Diagnosis present

## 2019-03-25 DIAGNOSIS — F319 Bipolar disorder, unspecified: Secondary | ICD-10-CM | POA: Diagnosis present

## 2019-03-25 DIAGNOSIS — E039 Hypothyroidism, unspecified: Secondary | ICD-10-CM | POA: Diagnosis present

## 2019-03-25 DIAGNOSIS — N183 Chronic kidney disease, stage 3 (moderate): Secondary | ICD-10-CM | POA: Diagnosis not present

## 2019-03-25 DIAGNOSIS — E875 Hyperkalemia: Secondary | ICD-10-CM | POA: Diagnosis not present

## 2019-03-25 DIAGNOSIS — M797 Fibromyalgia: Secondary | ICD-10-CM | POA: Diagnosis present

## 2019-03-25 DIAGNOSIS — Z880 Allergy status to penicillin: Secondary | ICD-10-CM

## 2019-03-25 DIAGNOSIS — Z01818 Encounter for other preprocedural examination: Secondary | ICD-10-CM

## 2019-03-25 DIAGNOSIS — Z1159 Encounter for screening for other viral diseases: Secondary | ICD-10-CM

## 2019-03-25 DIAGNOSIS — T84020A Dislocation of internal right hip prosthesis, initial encounter: Secondary | ICD-10-CM | POA: Diagnosis not present

## 2019-03-25 DIAGNOSIS — D649 Anemia, unspecified: Secondary | ICD-10-CM | POA: Diagnosis not present

## 2019-03-25 DIAGNOSIS — Z87891 Personal history of nicotine dependence: Secondary | ICD-10-CM

## 2019-03-25 DIAGNOSIS — Z7901 Long term (current) use of anticoagulants: Secondary | ICD-10-CM | POA: Diagnosis not present

## 2019-03-25 DIAGNOSIS — D62 Acute posthemorrhagic anemia: Secondary | ICD-10-CM | POA: Diagnosis not present

## 2019-03-25 DIAGNOSIS — Z85528 Personal history of other malignant neoplasm of kidney: Secondary | ICD-10-CM | POA: Diagnosis not present

## 2019-03-25 DIAGNOSIS — Z79899 Other long term (current) drug therapy: Secondary | ICD-10-CM

## 2019-03-25 DIAGNOSIS — T84020D Dislocation of internal right hip prosthesis, subsequent encounter: Secondary | ICD-10-CM | POA: Diagnosis not present

## 2019-03-25 DIAGNOSIS — M9701XA Periprosthetic fracture around internal prosthetic right hip joint, initial encounter: Secondary | ICD-10-CM | POA: Diagnosis not present

## 2019-03-25 DIAGNOSIS — F419 Anxiety disorder, unspecified: Secondary | ICD-10-CM | POA: Diagnosis not present

## 2019-03-25 DIAGNOSIS — Z905 Acquired absence of kidney: Secondary | ICD-10-CM

## 2019-03-25 DIAGNOSIS — Z981 Arthrodesis status: Secondary | ICD-10-CM

## 2019-03-25 DIAGNOSIS — D509 Iron deficiency anemia, unspecified: Secondary | ICD-10-CM | POA: Diagnosis not present

## 2019-03-25 DIAGNOSIS — K219 Gastro-esophageal reflux disease without esophagitis: Secondary | ICD-10-CM | POA: Diagnosis present

## 2019-03-25 DIAGNOSIS — W19XXXA Unspecified fall, initial encounter: Secondary | ICD-10-CM | POA: Diagnosis not present

## 2019-03-25 DIAGNOSIS — Z96649 Presence of unspecified artificial hip joint: Secondary | ICD-10-CM

## 2019-03-25 DIAGNOSIS — Z79890 Hormone replacement therapy (postmenopausal): Secondary | ICD-10-CM

## 2019-03-25 DIAGNOSIS — I5020 Unspecified systolic (congestive) heart failure: Secondary | ICD-10-CM | POA: Diagnosis not present

## 2019-03-25 DIAGNOSIS — Z85820 Personal history of malignant melanoma of skin: Secondary | ICD-10-CM

## 2019-03-25 DIAGNOSIS — G40909 Epilepsy, unspecified, not intractable, without status epilepticus: Secondary | ICD-10-CM | POA: Diagnosis not present

## 2019-03-25 DIAGNOSIS — Z96641 Presence of right artificial hip joint: Secondary | ICD-10-CM | POA: Diagnosis not present

## 2019-03-25 DIAGNOSIS — Y792 Prosthetic and other implants, materials and accessory orthopedic devices associated with adverse incidents: Secondary | ICD-10-CM | POA: Diagnosis present

## 2019-03-25 DIAGNOSIS — Z96612 Presence of left artificial shoulder joint: Secondary | ICD-10-CM | POA: Diagnosis present

## 2019-03-25 DIAGNOSIS — S73004A Unspecified dislocation of right hip, initial encounter: Secondary | ICD-10-CM

## 2019-03-25 DIAGNOSIS — Z85118 Personal history of other malignant neoplasm of bronchus and lung: Secondary | ICD-10-CM

## 2019-03-25 DIAGNOSIS — Z03818 Encounter for observation for suspected exposure to other biological agents ruled out: Secondary | ICD-10-CM | POA: Diagnosis not present

## 2019-03-25 DIAGNOSIS — R52 Pain, unspecified: Secondary | ICD-10-CM | POA: Diagnosis not present

## 2019-03-25 DIAGNOSIS — Z471 Aftercare following joint replacement surgery: Secondary | ICD-10-CM | POA: Diagnosis not present

## 2019-03-25 DIAGNOSIS — Z79891 Long term (current) use of opiate analgesic: Secondary | ICD-10-CM | POA: Diagnosis not present

## 2019-03-25 DIAGNOSIS — T84028A Dislocation of other internal joint prosthesis, initial encounter: Secondary | ICD-10-CM

## 2019-03-25 DIAGNOSIS — E876 Hypokalemia: Secondary | ICD-10-CM | POA: Diagnosis not present

## 2019-03-25 DIAGNOSIS — G4730 Sleep apnea, unspecified: Secondary | ICD-10-CM | POA: Diagnosis not present

## 2019-03-25 DIAGNOSIS — Z96653 Presence of artificial knee joint, bilateral: Secondary | ICD-10-CM | POA: Diagnosis present

## 2019-03-25 DIAGNOSIS — Z8543 Personal history of malignant neoplasm of ovary: Secondary | ICD-10-CM

## 2019-03-25 LAB — SARS CORONAVIRUS 2 BY RT PCR (HOSPITAL ORDER, PERFORMED IN ~~LOC~~ HOSPITAL LAB): SARS Coronavirus 2: NEGATIVE

## 2019-03-25 MED ORDER — PANTOPRAZOLE SODIUM 40 MG PO TBEC: 40.0000 mg | DELAYED_RELEASE_TABLET | Freq: Every day | ORAL | Status: DC | PRN

## 2019-03-25 MED ORDER — MORPHINE SULFATE ER 30 MG PO TBCR
30.0000 mg | EXTENDED_RELEASE_TABLET | Freq: Two times a day (BID) | ORAL | Status: DC
  Administered 2019-03-25 – 2019-03-28 (×6): 30 mg via ORAL
  Filled 2019-03-25 (×6): qty 1

## 2019-03-25 MED ORDER — DOCUSATE SODIUM 100 MG PO CAPS
100.0000 mg | ORAL_CAPSULE | Freq: Two times a day (BID) | ORAL | Status: DC
  Administered 2019-03-25 – 2019-03-27 (×3): 100 mg via ORAL
  Filled 2019-03-25 (×7): qty 1

## 2019-03-25 MED ORDER — BUSPIRONE HCL 5 MG PO TABS
15.0000 mg | ORAL_TABLET | Freq: Every day | ORAL | Status: DC
  Administered 2019-03-25 – 2019-03-28 (×4): 15 mg via ORAL
  Filled 2019-03-25 (×4): qty 1

## 2019-03-25 MED ORDER — TRAMADOL HCL 50 MG PO TABS
50.0000 mg | ORAL_TABLET | Freq: Four times a day (QID) | ORAL | Status: DC | PRN
  Administered 2019-03-28: 100 mg via ORAL
  Filled 2019-03-25: qty 2

## 2019-03-25 MED ORDER — ONDANSETRON HCL 4 MG PO TABS: 4.0000 mg | ORAL_TABLET | Freq: Four times a day (QID) | ORAL | Status: DC | PRN

## 2019-03-25 MED ORDER — MORPHINE SULFATE (PF) 4 MG/ML IV SOLN
4.0000 mg | Freq: Once | INTRAVENOUS | Status: AC
  Administered 2019-03-25: 4 mg via INTRAVENOUS
  Filled 2019-03-25: qty 1

## 2019-03-25 MED ORDER — SENNOSIDES-DOCUSATE SODIUM 8.6-50 MG PO TABS
1.0000 | ORAL_TABLET | Freq: Every evening | ORAL | Status: DC | PRN
  Administered 2019-03-25: 1 via ORAL

## 2019-03-25 MED ORDER — PROPOFOL 10 MG/ML IV BOLUS
INTRAVENOUS | Status: AC | PRN
  Administered 2019-03-25: 40 mg via INTRAVENOUS
  Administered 2019-03-25: 20 mg via INTRAVENOUS

## 2019-03-25 MED ORDER — METHOCARBAMOL 500 MG PO TABS
500.0000 mg | ORAL_TABLET | Freq: Four times a day (QID) | ORAL | Status: DC | PRN
  Administered 2019-03-28 (×2): 500 mg via ORAL
  Filled 2019-03-25 (×2): qty 1

## 2019-03-25 MED ORDER — FERROUS SULFATE 325 (65 FE) MG PO TABS
325.0000 mg | ORAL_TABLET | Freq: Every day | ORAL | Status: DC
  Administered 2019-03-26: 325 mg via ORAL
  Filled 2019-03-25: qty 1

## 2019-03-25 MED ORDER — FLEET ENEMA 7-19 GM/118ML RE ENEM: 1.0000 | ENEMA | Freq: Once | RECTAL | Status: DC | PRN

## 2019-03-25 MED ORDER — BISACODYL 10 MG RE SUPP: 10.0000 mg | Freq: Every day | RECTAL | Status: DC | PRN

## 2019-03-25 MED ORDER — METHOCARBAMOL 1000 MG/10ML IJ SOLN
500.0000 mg | Freq: Four times a day (QID) | INTRAVENOUS | Status: DC | PRN
  Administered 2019-03-27: 500 mg via INTRAVENOUS
  Filled 2019-03-25: qty 5

## 2019-03-25 MED ORDER — HYDROMORPHONE HCL 1 MG/ML IJ SOLN
1.0000 mg | INTRAMUSCULAR | Status: DC | PRN
  Administered 2019-03-26 – 2019-03-28 (×5): 1 mg via INTRAVENOUS
  Filled 2019-03-25 (×7): qty 1

## 2019-03-25 MED ORDER — SENNA 8.6 MG PO TABS
1.0000 | ORAL_TABLET | Freq: Two times a day (BID) | ORAL | Status: DC
  Administered 2019-03-25 – 2019-03-27 (×4): 8.6 mg via ORAL
  Filled 2019-03-25 (×7): qty 1

## 2019-03-25 MED ORDER — LISINOPRIL 10 MG PO TABS: 10.0000 mg | ORAL_TABLET | Freq: Every day | ORAL | Status: DC | PRN

## 2019-03-25 MED ORDER — DIPHENHYDRAMINE HCL 12.5 MG/5ML PO ELIX: 12.5000 mg | ORAL_SOLUTION | ORAL | Status: DC | PRN

## 2019-03-25 MED ORDER — AMLODIPINE BESYLATE 10 MG PO TABS
10.0000 mg | ORAL_TABLET | Freq: Every day | ORAL | Status: DC
  Administered 2019-03-25 – 2019-03-28 (×4): 10 mg via ORAL
  Filled 2019-03-25 (×4): qty 1

## 2019-03-25 MED ORDER — OXYCODONE-ACETAMINOPHEN 5-325 MG PO TABS
1.0000 | ORAL_TABLET | Freq: Four times a day (QID) | ORAL | Status: DC | PRN
  Administered 2019-03-25 – 2019-03-26 (×3): 1 via ORAL
  Filled 2019-03-25 (×4): qty 1

## 2019-03-25 MED ORDER — VITAMIN B-12 1000 MCG PO TABS
1000.0000 ug | ORAL_TABLET | Freq: Every day | ORAL | Status: DC
  Administered 2019-03-26 – 2019-03-28 (×2): 1000 ug via ORAL
  Filled 2019-03-25 (×2): qty 1

## 2019-03-25 MED ORDER — POTASSIUM CHLORIDE IN NACL 20-0.9 MEQ/L-% IV SOLN
INTRAVENOUS | Status: DC
  Administered 2019-03-25: 18:00:00 via INTRAVENOUS
  Filled 2019-03-25 (×2): qty 1000

## 2019-03-25 MED ORDER — ZOLPIDEM TARTRATE 5 MG PO TABS: 5.0000 mg | ORAL_TABLET | Freq: Every evening | ORAL | Status: DC | PRN

## 2019-03-25 MED ORDER — ZOLPIDEM TARTRATE 5 MG PO TABS
5.0000 mg | ORAL_TABLET | Freq: Every day | ORAL | Status: DC
  Administered 2019-03-25 – 2019-03-27 (×3): 5 mg via ORAL
  Filled 2019-03-25 (×3): qty 1

## 2019-03-25 MED ORDER — ACETAMINOPHEN 325 MG PO TABS: 650.0000 mg | ORAL_TABLET | Freq: Every day | ORAL | Status: DC | PRN

## 2019-03-25 MED ORDER — LEVOTHYROXINE SODIUM 100 MCG PO TABS
100.0000 ug | ORAL_TABLET | Freq: Every day | ORAL | Status: DC
  Administered 2019-03-26 – 2019-03-28 (×2): 100 ug via ORAL
  Filled 2019-03-25 (×2): qty 1

## 2019-03-25 MED ORDER — POLYETHYLENE GLYCOL 3350 17 G PO PACK: 17.0000 g | PACK | Freq: Every day | ORAL | Status: DC | PRN

## 2019-03-25 MED ORDER — DIPHENOXYLATE-ATROPINE 2.5-0.025 MG PO TABS: 2.0000 | ORAL_TABLET | Freq: Four times a day (QID) | ORAL | Status: DC | PRN

## 2019-03-25 MED ORDER — BUPROPION HCL ER (SR) 150 MG PO TB12
150.0000 mg | ORAL_TABLET | Freq: Every day | ORAL | Status: DC
  Administered 2019-03-25 – 2019-03-28 (×4): 150 mg via ORAL
  Filled 2019-03-25 (×4): qty 1

## 2019-03-25 MED ORDER — OXYCODONE HCL 5 MG PO TABS
5.0000 mg | ORAL_TABLET | Freq: Four times a day (QID) | ORAL | Status: DC | PRN
  Administered 2019-03-25: 5 mg via ORAL
  Filled 2019-03-25: qty 1

## 2019-03-25 MED ORDER — PROPOFOL 10 MG/ML IV BOLUS
0.5000 mg/kg | Freq: Once | INTRAVENOUS | Status: AC
  Administered 2019-03-25: 31.6 mg via INTRAVENOUS
  Filled 2019-03-25: qty 20

## 2019-03-25 MED ORDER — ONDANSETRON HCL 4 MG/2ML IJ SOLN: 4.0000 mg | Freq: Four times a day (QID) | INTRAMUSCULAR | Status: DC | PRN

## 2019-03-25 NOTE — ED Notes (Signed)
Patient is asymptomatic for covid- Covid swap sent. Per Altha Harm, Director. Patient that is asymptomatic can go up to the floor without covid-19 resulting.

## 2019-03-25 NOTE — ED Notes (Signed)
Report given to Calvin, RN.

## 2019-03-25 NOTE — ED Notes (Signed)
Transport has been called. ETA 20 min.

## 2019-03-25 NOTE — Progress Notes (Signed)
PT Cancellation Note  Patient Details Name: [name] MRN: [id number] DOB: 31-May-1951   Cancelled Treatment:    Reason Eval/Treat Not Completed: Medical issues which prohibited therapy - Pt arrived to ED with acute hip dislocation today, undergoing imaging. Pt to undergo THA revision likely on Thursday, PT to check back.   Julien Girt, PT Acute Rehabilitation Services Pager [phone]  Office [phone]    [place] 03/25/2019, 4:01 PM

## 2019-03-25 NOTE — ED Notes (Signed)
Patient given soda and sandwich.

## 2019-03-25 NOTE — ED Triage Notes (Signed)
Pt from home via EMS. Bent over to dry her dog and felt her hip pop . She then went to the ground. Pt not able to move RLE. Pedal pulses present per EMS, brisk capillary refill.

## 2019-03-25 NOTE — Progress Notes (Addendum)
Patient was transferred from ED to [facility] at 1725. Alert and oriented x4. RN got a report at 71. Vital signs was taken. Medication was late in ED at 1515. Some medication could not be given when patient was at 5 E due to pharmacist still locked order. Call light was in patient's reach. Floor matt is set up.

## 2019-03-25 NOTE — ED Notes (Signed)
40 mg propofol given

## 2019-03-25 NOTE — ED Notes (Signed)
90 mg total Porofol given. Rest wasted with Morey Hummingbird, RN.

## 2019-03-25 NOTE — ED Provider Notes (Signed)
[facility] DEPT Provider Note   CSN: [id number] Arrival date & time: 03/25/19  1107    History   Chief Complaint Chief Complaint  Patient presents with  . Hip Injury    right    HPI [name] is a 68 y.o. female.     HPI Patient has history of prior hip dislocation.  She was just reduced 2 days ago in the emergency department.  Patient reports that she bent over to pick up her dog and the hip popped out again.  Dr. Mardelle Matte has come to the emergency department to personally reduce the hip and admit the patient.  No other associated injury or problems.  Patient reports that she has tolerated sedation well.  Has not been sick recently.  She has had recent coronavirus testing negative. Past Medical History:  Diagnosis Date  . Anemia   . Anxiety   . Arthritis    "everywhere" (08/19/2013)  . Bilateral carpal tunnel syndrome   . Chest pain at rest, rule out pericarditis 11/27/2012  . CHF (congestive heart failure) ([place])   . Chronic kidney disease    dr Risa Grill  . Chronic lower back pain   . Closed displaced fracture of right femoral neck ([place]) 02/18/2019  . Complication of anesthesia    "woke up twice during knee replacement" (08/19/2013)  . Depression   . Dyspnea    w/ exertion    . Fibromyalgia   . Headache(784.0)    hx of migraines   . History of pericarditis, with pericardial window in 2009 11/27/2012  . Hx of ovarian cancer 11/27/2012  . Hypothyroidism 11/27/2012  . Iron deficiency anemia   . Kidney carcinoma ([place])    "both kidneys; ~ 3 yr apart" (08/19/2013)  . Lung cancer ([place]) 2018  . Melanoma of back ([place])   . Migraines   . Pneumonia    "several times; including today", RUL/notes 08/19/2013  . Squamous carcinoma    "face, side of my nose, chest; used acid to get rid of them" (08/19/2013)  . Venous insufficiency 11/27/2012    Patient Active Problem List   Diagnosis Date Noted  . Closed displaced fracture of right femoral neck ([place])  02/17/2019  . Leukocytosis 02/17/2019  . Normocytic normochromic anemia 02/17/2019  . Essential hypertension 02/17/2019  . Malnutrition of moderate degree 12/06/2018  . Pressure injury of skin 12/05/2018  . Hypomagnesemia 12/04/2018  . Dehydration 12/04/2018  . Weight loss 12/04/2018  . Colitis 12/04/2018  . 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 Benzodiazepine withdrawal without complication ([place]) 27/03/2375  . Hypokalemia 03/13/2018  . Acute on chronic renal insufficiency 03/13/2018  . Generalized anxiety disorder 03/13/2018  . Acute cognitive decline 03/13/2018  . Adenocarcinoma of right lung, stage 1 ([place]) 10/22/2017  . Chronic pain syndrome 07/25/2017  . S/P left TKA 08/02/2015  . S/P knee replacement 08/02/2015  . Carpal tunnel syndrome 04/23/2015  . Autoimmune disease ([place]) 03/29/2013  . Apnea, sleep 03/29/2013  . Anxiety 11/28/2012  . Seizure disorder ([place]) 11/28/2012  . Anxiety state 11/28/2012  . History of pericarditis, with pericardial window in 2009, and recurrent episodes 2011,2013, treated with methotrexate 11/27/2012  . SOB (shortness of breath) 11/27/2012  . Hypothyroidism 11/27/2012  . History of partial nephrectomy, both rt. and lt 11/27/2012  . Hx of ovarian cancer 11/27/2012  . Venous insufficiency 11/27/2012  . Chronic back pain, hx of lumbar spondylosis and stenosis L5-S1 with hx decompression and total diskectomy 11/27/2012  .  Hx of melanoma of skin 11/27/2012  . 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 Chronic venous insufficiency 11/27/2012  . DIARRHEA-PRESUMED INFECTIOUS 12/27/2009    Past Surgical History:  Procedure Laterality Date  . ABDOMINAL HYSTERECTOMY  1979  . ANTERIOR LAT LUMBAR FUSION Left 07/16/2018   Procedure: Left Lumbar two-three  Anterolateral decompression/interbody fusion with lateral plate fixation;  Surgeon: Kristeen Miss, MD;  Location: [facility];  Service: Neurosurgery;  Laterality: Left;  . APPENDECTOMY     . DOPPLER ECHOCARDIOGRAPHY  02/23/2011   LVEF>50%  . FRACTURE SURGERY    . KNEE ARTHROSCOPY Right    "twice before the replacement" (08/19/2013)  . LEFT OOPHORECTOMY Left 1979  . [place]   "ruptured disc"  . MELANOMA EXCISION  ~ 2010   "my lower back" (08/19/2013)  . ORIF HIP FRACTURE Left    x 2 age 61 and 4  . PARTIAL NEPHRECTOMY Right 2005; ~ 2008  . PERICARDIAL WINDOW  ~ 2012  . POSTERIOR LUMBAR FUSION  2002?; 03/2012  . REDUCTION MAMMAPLASTY Bilateral ~ 2008  . RIGHT OOPHORECTOMY  ~ 2007   "it had cancer in it" (08/19/2013)  . SHOULDER ARTHROSCOPY W/ ROTATOR CUFF REPAIR Right 1990's  . TONSILLECTOMY  1954  . TOTAL HIP ARTHROPLASTY Right 02/18/2019   Procedure: TOTAL HIP ARTHROPLASTY;  Surgeon: Marchia Bond, MD;  Location: [facility];  Service: Orthopedics;  Laterality: Right;  . TOTAL KNEE ARTHROPLASTY Right 1990's  . TOTAL KNEE ARTHROPLASTY Left 08/02/2015   Procedure: LEFT TOTAL KNEE ARTHROPLASTY;  Surgeon: Paralee Cancel, MD;  Location: [facility];  Service: Orthopedics;  Laterality: Left;  . TOTAL SHOULDER REPLACEMENT Left 2006?  . TUBAL LIGATION  1952  . VIDEO ASSISTED THORACOSCOPY (VATS)/WEDGE RESECTION Right 09/28/2017   Procedure: RIGHT VIDEO ASSISTED THORACOSCOPY (VATS)/WEDGE RESECTION, LYMPH NODE DISSECTION ;  Surgeon: Melrose Nakayama, MD;  Location: [facility];  Service: Thoracic;  Laterality: Right;     OB History   No obstetric history on file.      Home Medications    Prior to Admission medications   Medication Sig Start Date End Date Taking? Authorizing Provider  acetaminophen (TYLENOL) 325 MG tablet Take 650 mg by mouth daily as needed (pain).     [provider]  amLODipine (NORVASC) 10 MG tablet Take 10 mg by mouth daily.    [provider]  apixaban (ELIQUIS) 2.5 MG TABS tablet Take 1 tablet (2.5 mg total) by mouth 2 (two) times daily. 02/20/19   Caren Griffins, MD  buPROPion (WELLBUTRIN SR) 150 MG 12 hr tablet TAKE 1  TABLET BY MOUTH DAILY Patient taking differently: Take 150 mg by mouth daily.  12/16/18   Forrest Moron, MD  busPIRone (BUSPAR) 15 MG tablet Take 15 mg by mouth daily.  12/17/18   [provider]  cyclobenzaprine (FLEXERIL) 5 MG tablet Take 1 tablet (5 mg total) by mouth 3 (three) times daily as needed. For spasms Patient not taking: Reported on 03/23/2019 08/03/15   Danae Orleans, PA-C  diclofenac sodium (VOLTAREN) 1 % GEL Apply 2 g topically 2 (two) times daily as needed (pain).  10/25/16   [provider]  diphenoxylate-atropine (LOMOTIL) 2.5-0.025 MG tablet Take 2 tablets by mouth 4 (four) times daily as needed for diarrhea or loose stools.  12/19/18   [provider]  ferrous sulfate 325 (65 FE) MG tablet Take 325 mg by mouth daily. 12/31/18   [provider]  levothyroxine (SYNTHROID,  LEVOTHROID) 100 MCG tablet TAKE 1 TABLET BY MOUTH BEFORE BREAKFAST MONDAY-FRIDAY SKIP SATURDAY ANS SUNDAY Patient taking differently: Take 100 mcg by mouth daily.  04/26/18   Forrest Moron, MD  lisinopril (ZESTRIL) 10 MG tablet Take 10 mg by mouth daily as needed (high systolic).  01/15/19   [provider]  methocarbamol (ROBAXIN) 500 MG tablet Take 1 tablet (500 mg total) by mouth every 6 (six) hours as needed for muscle spasms. 07/18/18   Kristeen Miss, MD  morphine (MS CONTIN) 30 MG 12 hr tablet Take 1 tablet (30 mg total) by mouth every 12 (twelve) hours. 07/18/18   Kristeen Miss, MD  ondansetron (ZOFRAN) 4 MG tablet Take 4 mg by mouth 3 (three) times daily as needed for nausea/vomiting. 12/03/18   [provider]  oxyCODONE (OXY IR/ROXICODONE) 5 MG immediate release tablet Take 5 mg by mouth every 6 (six) hours as needed for pain. 03/03/19   [provider]  oxyCODONE-acetaminophen (PERCOCET/ROXICET) 5-325 MG tablet Take 1 tablet by mouth every 6 (six) hours as needed for severe pain. 03/23/19   Long, Wonda Olds, MD  pantoprazole (PROTONIX) 40 MG tablet  Take 40 mg by mouth daily as needed (gerd).  01/24/19   [provider]  polyethylene glycol (MIRALAX / GLYCOLAX) packet Take 17 g by mouth 2 (two) times daily. Patient taking differently: Take 17 g by mouth daily as needed for moderate constipation.  08/03/15   Danae Orleans, PA-C  traMADol (ULTRAM) 50 MG tablet Take 1-2 tablets (50-100 mg total) by mouth every 6 (six) hours as needed (mild pain). 10/02/17   Gold, Wilder Glade, PA-C  vitamin B-12 (CYANOCOBALAMIN) 1000 MCG tablet Take 1 tablet by mouth daily.    [provider]  zolpidem (AMBIEN) 5 MG tablet Take 5 mg by mouth at bedtime.  12/02/18   [provider]    Family History Family History  Problem Relation Age of Onset  . Cancer Mother 40       Stomach  . Cancer Father 36       Lung cancer  . Bipolar disorder Sister     Social History Social History   Tobacco Use  . Smoking status: Former Smoker    Packs/day: 1.50    Years: 18.00    Pack years: 27.00    Types: Cigarettes    Last attempt to quit: 09/22/1985    Years since quitting: 33.5  . Smokeless tobacco: Never Used  Substance Use Topics  . Alcohol use: No  . Drug use: No    Comment: prescribed     Allergies   Amoxicillin; Ampicillin; and Buspar [buspirone]   Review of Systems Review of Systems 10 Systems reviewed and are negative for acute change except as noted in the HPI.  Physical Exam Updated Vital Signs BP (!) 155/58 (BP Location: Right Arm)   Pulse 88   Temp 97.8 F (36.6 C) (Oral)   Resp 16   Ht 5\' 6"  (1.676 m)   Wt 63 kg   SpO2 100%   BMI 22.44 kg/m   Physical Exam Constitutional:      Appearance: Normal appearance. She is well-developed.  HENT:     Head: Normocephalic and atraumatic.     Mouth/Throat:     Mouth: Mucous membranes are moist.     Pharynx: Oropharynx is clear.     Comments: Upper dentures.  Airway widely patent.  Lower dentition intact. Eyes:     Extraocular Movements: Extraocular movements  intact.  Neck:     Musculoskeletal: Neck supple.  Cardiovascular:     Rate and Rhythm: Normal rate and regular rhythm.     Heart sounds: Normal heart sounds.  Pulmonary:     Effort: Pulmonary effort is normal.     Breath sounds: Normal breath sounds.  Abdominal:     General: There is no distension.     Palpations: Abdomen is soft.     Tenderness: There is no abdominal tenderness.  Musculoskeletal:        General: Signs of injury present.     Comments: Right lower extremity in a long-leg brace, shortened and rotated.  Skin:    General: Skin is warm and dry.  Neurological:     Mental Status: She is alert and oriented to person, place, and time.     GCS: GCS eye subscore is 4. GCS verbal subscore is 5. GCS motor subscore is 6.     Coordination: Coordination normal.  Psychiatric:        Mood and Affect: Mood normal.      ED Treatments / Results  Labs (all labs ordered are listed, but only abnormal results are displayed) Labs Reviewed - No data to display  EKG EKG Interpretation  Date/Time:  Tuesday [place] 02 2020 12:31:47 EDT Ventricular Rate:  74 PR Interval:    QRS Duration: 68 QT Interval:  351 QTC Calculation: 390 R Axis:   43 Text Interpretation:  Sinus rhythm Low voltage, extremity leads stable no change from old Confirmed by Charlesetta Shanks [phone]) on 03/25/2019 3:11:08 PM   Radiology Dg Chest 1 View  Result Date: 03/23/2019 CLINICAL DATA:  Right hip pain EXAM: CHEST  1 VIEW COMPARISON:  None. FINDINGS: Normal mediastinum and cardiac silhouette. Normal pulmonary vasculature. No evidence of effusion, infiltrate, or pneumothorax. No acute bony abnormality. LEFT shoulder arthroplasty IMPRESSION: No acute cardiopulmonary process. Electronically Signed   By: Suzy Bouchard M.D.   On: 03/23/2019 16:15   Dg Hip Unilat W Or Wo Pelvis 2-3 Views Right  Result Date: 03/23/2019 CLINICAL DATA:  Post right hip reduction  [id number] hip EXAM: DG HIP (WITH OR WITHOUT PELVIS) 2-3V  RIGHT COMPARISON:  None. FINDINGS: Interval reduction of the RIGHT hip dislocation. The femoral prosthetic is now within the acetabular cup. IMPRESSION: Successful reduction RIGHT hip prosthetic dislocation. Electronically Signed   By: Suzy Bouchard M.D.   On: 03/23/2019 18:59   Dg Hip Unilat W Or Wo Pelvis 2-3 Views Right  Result Date: 03/23/2019 CLINICAL DATA:  Right hip pain EXAM: DG HIP (WITH OR WITHOUT PELVIS) 2-3V RIGHT COMPARISON:  02/18/2019 pelvic radiograph FINDINGS: Right total hip arthroplasty. Posterosuperior right hip dislocation. No evidence of hardware fracture or loosening. Partially visualized extensive hardware in the lower lumbar spine. No acute osseous fracture. No left hip malalignment on frontal view. No focal osseous lesions. IMPRESSION: Posterosuperior right hip dislocation. Right total hip arthroplasty. No acute osseous fracture. Electronically Signed   By: Ilona Sorrel M.D.   On: 03/23/2019 16:17    Procedures .Sedation Date/Time: 03/25/2019 11:33 AM Performed by: Charlesetta Shanks, MD Authorized by: Charlesetta Shanks, MD   Consent:    Consent obtained:  Verbal   Consent given by:  Patient   Risks discussed:  Allergic reaction, dysrhythmia, inadequate sedation, nausea, prolonged hypoxia resulting in organ damage, prolonged sedation necessitating reversal, respiratory compromise necessitating ventilatory assistance and intubation and vomiting   Alternatives discussed:  Analgesia without sedation, anxiolysis and regional anesthesia Universal protocol:  Procedure explained and questions answered to patient or proxy's satisfaction: yes     Relevant documents present and verified: yes     Test results available and properly labeled: yes     Imaging studies available: yes     Required blood products, implants, devices, and special equipment available: yes     Site/side marked: yes     Immediately prior to procedure a time out was called: yes     Patient identity  confirmation method:  Verbally with patient Indications:    Procedure necessitating sedation performed by:  Different physician Pre-sedation assessment:    Time since last food or drink:  Unknown   ASA classification: class 1 - normal, healthy patient     Neck mobility: normal     Mouth opening:  3 or more finger widths   Thyromental distance:  4 finger widths   Mallampati score:  I - soft palate, uvula, fauces, pillars visible   Pre-sedation assessments completed and reviewed: airway patency, cardiovascular function, hydration status, mental status, nausea/vomiting, pain level, respiratory function and temperature     Pre-sedation assessment completed:  03/25/2019 11:33 AM Immediate pre-procedure details:    Reassessment: Patient reassessed immediately prior to procedure     Reviewed: vital signs, relevant labs/tests and NPO status     Verified: bag valve mask available, emergency equipment available, intubation equipment available, IV patency confirmed, oxygen available and suction available   Procedure details (see MAR for exact dosages):    Preoxygenation:  Nasal cannula   Sedation:  Propofol   Intra-procedure monitoring:  Blood pressure monitoring, cardiac monitor, continuous pulse oximetry, frequent LOC assessments, frequent vital sign checks and continuous capnometry   Intra-procedure events: none     Total Provider sedation time (minutes):  20 Post-procedure details:    Attendance: Constant attendance by certified staff until patient recovered     Recovery: Patient returned to pre-procedure baseline     Post-sedation assessments completed and reviewed: airway patency, cardiovascular function, hydration status, mental status, nausea/vomiting, pain level, respiratory function and temperature     Patient is stable for discharge or admission: yes     Patient tolerance:  Tolerated well, no immediate complications   (including critical care time)  Medications Ordered in ED Medications   propofol (DIPRIVAN) 10 mg/mL bolus/IV push 31.6 mg (has no administration in time range)     Initial Impression / Assessment and Plan / ED Course  I have reviewed the triage vital signs and the nursing notes.  Pertinent labs & imaging results that were available during my care of the patient were reviewed by me and considered in my medical decision making (see chart for details).        Patient sent in by Dr. Mardelle Matte for hip reduction.  Sedation services performed by myself.  Patient tolerated well.  Reduction done by Dr. Mardelle Matte and his physician assistant.  Final Clinical Impressions(s) / ED Diagnoses   Final diagnoses:  Dislocation of right hip, initial encounter [facility])    ED Discharge Orders    None       Charlesetta Shanks, MD 04/05/19 1423

## 2019-03-25 NOTE — ED Notes (Signed)
Bed: WA21 Expected date:  Expected time:  Means of arrival:  Comments: 

## 2019-03-25 NOTE — ED Notes (Signed)
ED TO INPATIENT HANDOFF REPORT  ED Nurse Name and Phone #: [phone]  S Name/Age/Gender [name] 68 y.o. female Room/Bed: WA21/WA21  Code Status   Code Status: Full Code  Home/SNF/Other Home Patient oriented to: self, place, time and situation Is this baseline? Yes   Triage Complete: Triage complete  Chief Complaint right hip dislocation  Triage Note Pt from home via EMS. Bent over to dry her dog and felt her hip pop . She then went to the ground. Pt not able to move RLE. Pedal pulses present per EMS, brisk capillary refill.    Allergies Allergies  Allergen Reactions  . Amoxicillin Other (See Comments)    UNSPECIFIED REACTION  Has patient had a PCN reaction causing immediate rash, facial/tongue/throat swelling, SOB or lightheadedness with hypotension: No Has patient had a PCN reaction causing severe rash involving mucus membranes or skin necrosis: No Has patient had a PCN reaction that required hospitalization No Has patient had a PCN reaction occurring within the last 10 years: No If all of the above answers are "NO", then may proceed with Cephalosporin use.  . Ampicillin Rash and Other (See Comments)    Has patient had a PCN reaction causing immediate rash, facial/tongue/throat swelling, SOB or lightheadedness with hypotension: No Has patient had a PCN reaction causing severe rash involving mucus membranes or skin necrosis: No Has patient had a PCN reaction that required hospitalization No Has patient had a PCN reaction occurring within the last 10 years: No If all of the above answers are "NO", then may proceed with Cephalosporin use.  Ebbie Ridge [Buspirone] Nausea And Vomiting    Patient can tolerate    Level of Care/Admitting Diagnosis ED Disposition    ED Disposition Condition Comment   Admit  Hospital Area: [facility] [[id number]]  Level of Care: Med-Surg [16]  Covid Evaluation: Screening Protocol (No Symptoms)  Diagnosis: Failed total hip  arthroplasty with dislocation [facility]) [[id number]]  Admitting Physician: Marchia Bond [[id number]]  Attending Physician: Marchia Bond [[id number]]  Estimated length of stay: past midnight tomorrow  Certification:: I certify this patient is being admitted for an inpatient-only procedure  PT Class (Do Not Modify): Inpatient [101]  PT Acc Code (Do Not Modify): Private [1]       B Medical/Surgery History Past Medical History:  Diagnosis Date  . Anemia   . Anxiety   . Arthritis    "everywhere" (08/19/2013)  . Bilateral carpal tunnel syndrome   . Chest pain at rest, rule out pericarditis 11/27/2012  . CHF (congestive heart failure) ([place])   . Chronic kidney disease    dr Risa Grill  . Chronic lower back pain   . Closed displaced fracture of right femoral neck ([place]) 02/18/2019  . Complication of anesthesia    "woke up twice during knee replacement" (08/19/2013)  . Depression   . Dyspnea    w/ exertion    . Fibromyalgia   . Headache(784.0)    hx of migraines   . History of pericarditis, with pericardial window in 2009 11/27/2012  . Hx of ovarian cancer 11/27/2012  . Hypothyroidism 11/27/2012  . Iron deficiency anemia   . Kidney carcinoma ([place])    "both kidneys; ~ 3 yr apart" (08/19/2013)  . Lung cancer ([place]) 2018  . Melanoma of back ([place])   . Migraines   . Pneumonia    "several times; including today", RUL/notes 08/19/2013  . Squamous carcinoma    "face, side of my nose, chest; used acid to  get rid of them" (08/19/2013)  . Venous insufficiency 11/27/2012   Past Surgical History:  Procedure Laterality Date  . ABDOMINAL HYSTERECTOMY  1979  . ANTERIOR LAT LUMBAR FUSION Left 07/16/2018   Procedure: Left Lumbar two-three  Anterolateral decompression/interbody fusion with lateral plate fixation;  Surgeon: Kristeen Miss, MD;  Location: [facility];  Service: Neurosurgery;  Laterality: Left;  . APPENDECTOMY    . DOPPLER ECHOCARDIOGRAPHY  02/23/2011   LVEF>50%  . FRACTURE SURGERY    . KNEE ARTHROSCOPY Right     "twice before the replacement" (08/19/2013)  . LEFT OOPHORECTOMY Left 1979  . [place]   "ruptured disc"  . MELANOMA EXCISION  ~ 2010   "my lower back" (08/19/2013)  . ORIF HIP FRACTURE Left    x 2 age 72 and 4  . PARTIAL NEPHRECTOMY Right 2005; ~ 2008  . PERICARDIAL WINDOW  ~ 2012  . POSTERIOR LUMBAR FUSION  2002?; 03/2012  . REDUCTION MAMMAPLASTY Bilateral ~ 2008  . RIGHT OOPHORECTOMY  ~ 2007   "it had cancer in it" (08/19/2013)  . SHOULDER ARTHROSCOPY W/ ROTATOR CUFF REPAIR Right 1990's  . TONSILLECTOMY  1954  . TOTAL HIP ARTHROPLASTY Right 02/18/2019   Procedure: TOTAL HIP ARTHROPLASTY;  Surgeon: Marchia Bond, MD;  Location: [facility];  Service: Orthopedics;  Laterality: Right;  . TOTAL KNEE ARTHROPLASTY Right 1990's  . TOTAL KNEE ARTHROPLASTY Left 08/02/2015   Procedure: LEFT TOTAL KNEE ARTHROPLASTY;  Surgeon: Paralee Cancel, MD;  Location[facility];  Service: Orthopedics;  Laterality: Left;  . TOTAL SHOULDER REPLACEMENT Left 2006?  . TUBAL LIGATION  1952  . VIDEO ASSISTED THORACOSCOPY (VATS)/WEDGE RESECTION Right 09/28/2017   Procedure: RIGHT VIDEO ASSISTED THORACOSCOPY (VATS)/WEDGE RESECTION, LYMPH NODE DISSECTION ;  Surgeon: Melrose Nakayama, MD;  Location: [facility];  Service: Thoracic;  Laterality: Right;     A IV Location/Drains/Wounds Patient Lines/Drains/Airways Status   Active Line/Drains/Airways    Name:   Placement date:   Placement time:   Site:   Days:   Peripheral IV 03/23/19 Right Forearm   03/23/19    1446    Forearm   2   Peripheral IV 03/25/19 Left Antecubital   03/25/19    1157    Antecubital   less than 1   Peripheral IV 03/25/19 Right Antecubital   03/25/19    1157    Antecubital   less than 1   Incision (Closed) 02/18/19 Hip Right   02/18/19    1709     35   Pressure Injury 12/04/18 Stage II -  Partial thickness loss of dermis presenting as a shallow open ulcer with a red, pink wound bed without slough.   12/04/18    2217     111           Intake/Output Last 24 hours No intake or output data in the 24 hours ending 03/25/19 1655  Labs/Imaging No results found for this or any previous visit (from the past 48 hour(s)). Dg Hip Port Unilat W Or Wo Pelvis 1 View Right  Result Date: 03/25/2019 CLINICAL DATA:  Postreduction dislocated right hip. EXAM: DG HIP (WITH OR WITHOUT PELVIS) 1V PORT RIGHT COMPARISON:  Radiographs 03/23/2019. FINDINGS: Single AP view at 1144 hours. The right total hip arthroplasty appears located on this single view. No evidence of acute fracture. There is no hardware loosening. Postsurgical changes are partially imaged within the lower lumbar spine. IMPRESSION: The previously dislocated right total hip arthroplasty appears to  remain located on this single AP view. No acute osseous findings. Electronically Signed   By: Richardean Sale M.D.   On: 03/25/2019 12:27   Dg Hip Unilat W Or Wo Pelvis 2-3 Views Right  Result Date: 03/23/2019 CLINICAL DATA:  Post right hip reduction  KCL:EXNTZ hip EXAM: DG HIP (WITH OR WITHOUT PELVIS) 2-3V RIGHT COMPARISON:  None. FINDINGS: Interval reduction of the RIGHT hip dislocation. The femoral prosthetic is now within the acetabular cup. IMPRESSION: Successful reduction RIGHT hip prosthetic dislocation. Electronically Signed   By: Suzy Bouchard M.D.   On: 03/23/2019 18:59    Pending Labs Unresulted Labs (From admission, onward)    Start     Ordered   03/26/19 0500  CBC  Tomorrow morning,   R     03/25/19 1320   03/26/19 0017  Basic metabolic panel  Tomorrow morning,   R     03/25/19 1320   03/25/19 1208  SARS Coronavirus 2 (CEPHEID - Performed in [place] hospital lab), Hosp Order  (Asymptomatic Patients Labs)  Once,   R    Question:  Rule Out  Answer:  Yes   03/25/19 1208          Vitals/Pain Today's Vitals   03/25/19 1500 03/25/19 1530 03/25/19 1544 03/25/19 1630  BP: 116/60 (!) 128/55  120/60  Pulse: 77 81  82  Resp: 13 17  15   Temp:      TempSrc:       SpO2: 100% 100%  100%  Weight:      Height:      PainSc:   3      Isolation Precautions No active isolations  Medications Medications  acetaminophen (TYLENOL) tablet 650 mg (has no administration in time range)  morphine (MS CONTIN) 12 hr tablet 30 mg (has no administration in time range)  oxyCODONE (Oxy IR/ROXICODONE) immediate release tablet 5 mg (has no administration in time range)  oxyCODONE-acetaminophen (PERCOCET/ROXICET) 5-325 MG per tablet 1 tablet (has no administration in time range)  traMADol (ULTRAM) tablet 50-100 mg (has no administration in time range)  amLODipine (NORVASC) tablet 10 mg (has no administration in time range)  lisinopril (ZESTRIL) tablet 10 mg (has no administration in time range)  buPROPion (WELLBUTRIN SR) 12 hr tablet 150 mg (has no administration in time range)  busPIRone (BUSPAR) tablet 15 mg (has no administration in time range)  zolpidem (AMBIEN) tablet 5 mg (has no administration in time range)  levothyroxine (SYNTHROID) tablet 100 mcg (has no administration in time range)  diphenoxylate-atropine (LOMOTIL) 2.5-0.025 MG per tablet 2 tablet (has no administration in time range)  pantoprazole (PROTONIX) EC tablet 40 mg (has no administration in time range)  polyethylene glycol (MIRALAX / GLYCOLAX) packet 17 g (has no administration in time range)  ferrous sulfate tablet 325 mg (has no administration in time range)  vitamin B-12 (CYANOCOBALAMIN) tablet 1,000 mcg (has no administration in time range)  0.9 % NaCl with KCl 20 mEq/ L  infusion (has no administration in time range)  methocarbamol (ROBAXIN) tablet 500 mg (has no administration in time range)    Or  methocarbamol (ROBAXIN) 500 mg in dextrose 5 % 50 mL IVPB (has no administration in time range)  zolpidem (AMBIEN) tablet 5 mg (has no administration in time range)  diphenhydrAMINE (BENADRYL) 12.5 MG/5ML elixir 12.5-25 mg (has no administration in time range)  docusate sodium (COLACE) capsule  100 mg (has no administration in time range)  senna (SENOKOT) tablet 8.6 mg (has  no administration in time range)  senna-docusate (Senokot-S) tablet 1 tablet (has no administration in time range)  bisacodyl (DULCOLAX) suppository 10 mg (has no administration in time range)  sodium phosphate (FLEET) 7-19 GM/118ML enema 1 enema (has no administration in time range)  ondansetron (ZOFRAN) tablet 4 mg (has no administration in time range)    Or  ondansetron (ZOFRAN) injection 4 mg (has no administration in time range)  HYDROmorphone (DILAUDID) injection 1 mg (has no administration in time range)  propofol (DIPRIVAN) 10 mg/mL bolus/IV push 31.6 mg (31.6 mg Intravenous Given 03/25/19 1200)  propofol (DIPRIVAN) 10 mg/mL bolus/IV push (20 mg Intravenous Given 03/25/19 1202)  morphine 4 MG/ML injection 4 mg (4 mg Intravenous Given 03/25/19 1423)    Mobility non-ambulatory High fall risk   Focused Assessments Ortho   R Recommendations: See Admitting Provider Note  Report given to:   Additional Notes: N/A

## 2019-03-25 NOTE — Consult Note (Signed)
History and Physical    [name] EHO:[id number] DOB: [birth year] DOA: 03/25/2019  PCP: Bernerd Limbo, MD Patient coming from: Home  Reason for consult: Pre-op risk Consulting physician: Charlesetta Shanks, MD  HPI: [name] is a 68 y.o. female with medical history significant of anxiety, heart failure, CKD, recent hip fracture s/p total hip arthroplasty.  Patient presented secondary to severe right leg pain.  She reports that earlier today, she was washing her dog and while bending over heard a pop.  She reports sliding down but did not fall.  She did not hit her head.  She was in extreme pain at that time and EMS was called to transport her to the emergency department.  She did not take anything for pain at the time.  Review of Systems: Review of Systems  Respiratory: Negative for shortness of breath.   Cardiovascular: Negative for chest pain and palpitations.  Musculoskeletal: Positive for joint pain.  All other systems reviewed and are negative.   Past Medical History:  Diagnosis Date  . Anemia   . Anxiety   . Arthritis    "everywhere" (08/19/2013)  . Bilateral carpal tunnel syndrome   . Chest pain at rest, rule out pericarditis 11/27/2012  . CHF (congestive heart failure) ([place])   . Chronic kidney disease    dr Risa Grill  . Chronic lower back pain   . Closed displaced fracture of right femoral neck ([place]) 02/18/2019  . Complication of anesthesia    "woke up twice during knee replacement" (08/19/2013)  . Depression   . Dyspnea    w/ exertion    . Fibromyalgia   . Headache(784.0)    hx of migraines   . History of pericarditis, with pericardial window in 2009 11/27/2012  . Hx of ovarian cancer 11/27/2012  . Hypothyroidism 11/27/2012  . Iron deficiency anemia   . Kidney carcinoma ([place])    "both kidneys; ~ 3 yr apart" (08/19/2013)  . Lung cancer ([place]) 2018  . Melanoma of back ([place])   . Migraines   . Pneumonia    "several times; including today", RUL/notes 08/19/2013  .  Squamous carcinoma    "face, side of my nose, chest; used acid to get rid of them" (08/19/2013)  . Venous insufficiency 11/27/2012    Past Surgical History:  Procedure Laterality Date  . ABDOMINAL HYSTERECTOMY  1979  . ANTERIOR LAT LUMBAR FUSION Left 07/16/2018   Procedure: Left Lumbar two-three  Anterolateral decompression/interbody fusion with lateral plate fixation;  Surgeon: Kristeen Miss, MD;  Location: [facility];  Service: Neurosurgery;  Laterality: Left;  . APPENDECTOMY    . DOPPLER ECHOCARDIOGRAPHY  02/23/2011   LVEF>50%  . FRACTURE SURGERY    . KNEE ARTHROSCOPY Right    "twice before the replacement" (08/19/2013)  . LEFT OOPHORECTOMY Left 1979  . [place]   "ruptured disc"  . MELANOMA EXCISION  ~ 2010   "my lower back" (08/19/2013)  . ORIF HIP FRACTURE Left    x 2 age 30 and 4  . PARTIAL NEPHRECTOMY Right 2005; ~ 2008  . PERICARDIAL WINDOW  ~ 2012  . POSTERIOR LUMBAR FUSION  2002?; 03/2012  . REDUCTION MAMMAPLASTY Bilateral ~ 2008  . RIGHT OOPHORECTOMY  ~ 2007   "it had cancer in it" (08/19/2013)  . SHOULDER ARTHROSCOPY W/ ROTATOR CUFF REPAIR Right 1990's  . TONSILLECTOMY  1954  . TOTAL HIP ARTHROPLASTY Right 02/18/2019   Procedure: TOTAL HIP ARTHROPLASTY;  Surgeon: Marchia Bond, MD;  Location: [facility];  Service: Orthopedics;  Laterality: Right;  . TOTAL KNEE ARTHROPLASTY Right 1990's  . TOTAL KNEE ARTHROPLASTY Left 08/02/2015   Procedure: LEFT TOTAL KNEE ARTHROPLASTY;  Surgeon: Paralee Cancel, MD;  [facility];  Service: Orthopedics;  Laterality: Left;  . TOTAL SHOULDER REPLACEMENT Left 2006?  . TUBAL LIGATION  1952  . VIDEO ASSISTED THORACOSCOPY (VATS)/WEDGE RESECTION Right 09/28/2017   Procedure: RIGHT VIDEO ASSISTED THORACOSCOPY (VATS)/WEDGE RESECTION, LYMPH NODE DISSECTION ;  Surgeon: Melrose Nakayama, MD;  Location: [facility];  Service: Thoracic;  Laterality: Right;     reports that she quit smoking about 33 years ago. Her smoking use included  cigarettes. She has a 27.00 pack-year smoking history. She has never used smokeless tobacco. She reports that she does not drink alcohol or use drugs.  Allergies  Allergen Reactions  . Amoxicillin Other (See Comments)    UNSPECIFIED REACTION  Has patient had a PCN reaction causing immediate rash, facial/tongue/throat swelling, SOB or lightheadedness with hypotension: No Has patient had a PCN reaction causing severe rash involving mucus membranes or skin necrosis: No Has patient had a PCN reaction that required hospitalization No Has patient had a PCN reaction occurring within the last 10 years: No If all of the above answers are "NO", then may proceed with Cephalosporin use.  . Ampicillin Rash and Other (See Comments)    Has patient had a PCN reaction causing immediate rash, facial/tongue/throat swelling, SOB or lightheadedness with hypotension: No Has patient had a PCN reaction causing severe rash involving mucus membranes or skin necrosis: No Has patient had a PCN reaction that required hospitalization No Has patient had a PCN reaction occurring within the last 10 years: No If all of the above answers are "NO", then may proceed with Cephalosporin use.  Ebbie Ridge [Buspirone] Nausea And Vomiting    Patient can tolerate    Family History  Problem Relation Age of Onset  . Cancer Mother 63       Stomach  . Cancer Father 83       Lung cancer  . Bipolar disorder Sister    Prior to Admission medications   Medication Sig Start Date End Date Taking? Authorizing Provider  acetaminophen (TYLENOL) 325 MG tablet Take 650 mg by mouth daily as needed (pain).     [provider]  amLODipine (NORVASC) 10 MG tablet Take 10 mg by mouth daily.    [provider]  apixaban (ELIQUIS) 2.5 MG TABS tablet Take 1 tablet (2.5 mg total) by mouth 2 (two) times daily. 02/20/19   Caren Griffins, MD  buPROPion (WELLBUTRIN SR) 150 MG 12 hr tablet TAKE 1 TABLET BY MOUTH DAILY Patient taking  differently: Take 150 mg by mouth daily.  12/16/18   Forrest Moron, MD  busPIRone (BUSPAR) 15 MG tablet Take 15 mg by mouth daily.  12/17/18   [provider]  cyclobenzaprine (FLEXERIL) 5 MG tablet Take 1 tablet (5 mg total) by mouth 3 (three) times daily as needed. For spasms Patient not taking: Reported on 03/23/2019 08/03/15   Danae Orleans, PA-C  diclofenac sodium (VOLTAREN) 1 % GEL Apply 2 g topically 2 (two) times daily as needed (pain).  10/25/16   [provider]  diphenoxylate-atropine (LOMOTIL) 2.5-0.025 MG tablet Take 2 tablets by mouth 4 (four) times daily as needed for diarrhea or loose stools.  12/19/18   [provider]  ferrous sulfate 325 (65 FE) MG tablet Take 325 mg by mouth daily. 12/31/18  [provider]  levothyroxine (SYNTHROID, LEVOTHROID) 100 MCG tablet TAKE 1 TABLET BY MOUTH BEFORE BREAKFAST MONDAY-FRIDAY SKIP SATURDAY ANS SUNDAY Patient taking differently: Take 100 mcg by mouth daily.  04/26/18   Forrest Moron, MD  lisinopril (ZESTRIL) 10 MG tablet Take 10 mg by mouth daily as needed (high systolic).  01/15/19   [provider]  methocarbamol (ROBAXIN) 500 MG tablet Take 1 tablet (500 mg total) by mouth every 6 (six) hours as needed for muscle spasms. 07/18/18   Kristeen Miss, MD  morphine (MS CONTIN) 30 MG 12 hr tablet Take 1 tablet (30 mg total) by mouth every 12 (twelve) hours. 07/18/18   Kristeen Miss, MD  ondansetron (ZOFRAN) 4 MG tablet Take 4 mg by mouth 3 (three) times daily as needed for nausea/vomiting. 12/03/18   [provider]  oxyCODONE (OXY IR/ROXICODONE) 5 MG immediate release tablet Take 5 mg by mouth every 6 (six) hours as needed for pain. 03/03/19   [provider]  oxyCODONE-acetaminophen (PERCOCET/ROXICET) 5-325 MG tablet Take 1 tablet by mouth every 6 (six) hours as needed for severe pain. 03/23/19   Long, Wonda Olds, MD  pantoprazole (PROTONIX) 40 MG tablet Take 40 mg by mouth daily as needed  (gerd).  01/24/19   [provider]  polyethylene glycol (MIRALAX / GLYCOLAX) packet Take 17 g by mouth 2 (two) times daily. Patient taking differently: Take 17 g by mouth daily as needed for moderate constipation.  08/03/15   Danae Orleans, PA-C  traMADol (ULTRAM) 50 MG tablet Take 1-2 tablets (50-100 mg total) by mouth every 6 (six) hours as needed (mild pain). 10/02/17   Gold, Wilder Glade, PA-C  vitamin B-12 (CYANOCOBALAMIN) 1000 MCG tablet Take 1 tablet by mouth daily.    [provider]  zolpidem (AMBIEN) 5 MG tablet Take 5 mg by mouth at bedtime.  12/02/18   [provider]    Physical Exam:  Physical Exam Constitutional:      General: She is not in acute distress.    Appearance: She is well-developed. She is not diaphoretic.  Eyes:     Conjunctiva/sclera: Conjunctivae normal.     Pupils: Pupils are equal, round, and reactive to light.  Neck:     Musculoskeletal: Normal range of motion.     Vascular: Carotid bruit (left) and JVD present.  Cardiovascular:     Rate and Rhythm: Normal rate and regular rhythm.     Heart sounds: Murmur present. Systolic murmur present with a grade of 1/6.  Pulmonary:     Effort: Pulmonary effort is normal. No respiratory distress.     Breath sounds: Normal breath sounds. No wheezing or rales.  Abdominal:     General: Bowel sounds are normal. There is no distension.     Palpations: Abdomen is soft.     Tenderness: There is no abdominal tenderness. There is no guarding or rebound.  Musculoskeletal: Normal range of motion.        General: No tenderness.     Right lower leg: No edema.     Left lower leg: No edema.  Lymphadenopathy:     Cervical: No cervical adenopathy.  Skin:    General: Skin is warm and dry.  Neurological:     Mental Status: She is alert and oriented to person, place, and time.     Labs on Admission: I have personally reviewed following labs and imaging studies  Radiological Exams on Admission: Dg  Chest 1 View  Result Date:  03/23/2019 CLINICAL DATA:  Right hip pain EXAM: CHEST  1 VIEW COMPARISON:  None. FINDINGS: Normal mediastinum and cardiac silhouette. Normal pulmonary vasculature. No evidence of effusion, infiltrate, or pneumothorax. No acute bony abnormality. LEFT shoulder arthroplasty IMPRESSION: No acute cardiopulmonary process. Electronically Signed   By: Suzy Bouchard M.D.   On: 03/23/2019 16:15   Dg Hip Unilat W Or Wo Pelvis 2-3 Views Right  Result Date: 03/23/2019 CLINICAL DATA:  Post right hip reduction  FBP:PHKFE hip EXAM: DG HIP (WITH OR WITHOUT PELVIS) 2-3V RIGHT COMPARISON:  None. FINDINGS: Interval reduction of the RIGHT hip dislocation. The femoral prosthetic is now within the acetabular cup. IMPRESSION: Successful reduction RIGHT hip prosthetic dislocation. Electronically Signed   By: Suzy Bouchard M.D.   On: 03/23/2019 18:59   Dg Hip Unilat W Or Wo Pelvis 2-3 Views Right  Result Date: 03/23/2019 CLINICAL DATA:  Right hip pain EXAM: DG HIP (WITH OR WITHOUT PELVIS) 2-3V RIGHT COMPARISON:  02/18/2019 pelvic radiograph FINDINGS: Right total hip arthroplasty. Posterosuperior right hip dislocation. No evidence of hardware fracture or loosening. Partially visualized extensive hardware in the lower lumbar spine. No acute osseous fracture. No left hip malalignment on frontal view. No focal osseous lesions. IMPRESSION: Posterosuperior right hip dislocation. Right total hip arthroplasty. No acute osseous fracture. Electronically Signed   By: Ilona Sorrel M.D.   On: 03/23/2019 16:17    EKG: Independently reviewed. Sinus tachycardia  Assessment/Plan Active Problems:   Anxiety   Normocytic normochromic anemia   Essential hypertension   Right prosthetic hip dislocation Patient has good functional status at baseline. History of heart failure with EF of 45% although no symptomatic history of heart failure for several years. No history of arrhythmia. History of coronary  calcifications on remote imaging. Using NSQIP calculator, patient is below average risk for cardiac complications from hip revision. -Orthopedic surgery management -Recommendations: patient is below average risk for this surgery. Would recommend no intervention at this time prior to surgery  Murmur No urgent need for Transthoracic Echocardiogram at this time. Recommend follow-up as needed.  Essential hypertension -Continue amlodipine and lisinopril  CKD stage III Baseline creatinine of 1. Stable.  Anemia Chronic since last surgery. She was transfused at that time. Slightly below previous baseline of 7.5. overall baseline appears to be around 8-9 -Repeat CBC in AM  Hypothyroidism -Continue Synthroid  Anxiety -Continue Wellbutrin SR and Buspar   DVT prophylaxis: Per primary Family Communication: None Disposition Plan: Per primary   Cordelia Poche, MD [facility] 03/25/2019, 12:28 PM

## 2019-03-25 NOTE — H&P (Signed)
PREOPERATIVE H&P  Chief Complaint: Right hip pain  HPI: [name] is a 68 y.o. female who who had a right femoral neck fracture and underwent total hip replacement approximately 6 weeks ago.  2 days ago she was standing on her right leg, balancing, and trying to put on her left sock when her right hip dislocated.  She was close reduced, placed in knee immobilizer, and then discharged home.  Yesterday I arrange for her to get a hip spica brace although she has not received that yet.  She reports that she was wearing her knee immobilizer, and bending forward to dry off her dog after bathing her dog, and felt a pop and acute pain over the right hip with shortening of the leg, the exact same feeling she had when she dislocated the hip 2 days ago.  She came to [facility] long ER for management.  Past Medical History:  Diagnosis Date  . Anemia   . Anxiety   . Arthritis    "everywhere" (08/19/2013)  . Bilateral carpal tunnel syndrome   . Chest pain at rest, rule out pericarditis 11/27/2012  . CHF (congestive heart failure) ([place])   . Chronic kidney disease    dr Risa Grill  . Chronic lower back pain   . Closed displaced fracture of right femoral neck ([place]) 02/18/2019  . Complication of anesthesia    "woke up twice during knee replacement" (08/19/2013)  . Depression   . Dyspnea    w/ exertion    . Fibromyalgia   . Headache(784.0)    hx of migraines   . History of pericarditis, with pericardial window in 2009 11/27/2012  . Hx of ovarian cancer 11/27/2012  . Hypothyroidism 11/27/2012  . Iron deficiency anemia   . Kidney carcinoma ([place])    "both kidneys; ~ 3 yr apart" (08/19/2013)  . Lung cancer ([place]) 2018  . Melanoma of back ([place])   . Migraines   . Pneumonia    "several times; including today", RUL/notes 08/19/2013  . Squamous carcinoma    "face, side of my nose, chest; used acid to get rid of them" (08/19/2013)  . Venous insufficiency 11/27/2012   Past Surgical History:  Procedure Laterality Date   . ABDOMINAL HYSTERECTOMY  1979  . ANTERIOR LAT LUMBAR FUSION Left 07/16/2018   Procedure: Left Lumbar two-three  Anterolateral decompression/interbody fusion with lateral plate fixation;  Surgeon: Kristeen Miss, MD;  Location: [place];  Service: Neurosurgery;  Laterality: Left;  . APPENDECTOMY    . DOPPLER ECHOCARDIOGRAPHY  02/23/2011   LVEF>50%  . FRACTURE SURGERY    . KNEE ARTHROSCOPY Right    "twice before the replacement" (08/19/2013)  . LEFT OOPHORECTOMY Left 1979  . [place]   "ruptured disc"  . MELANOMA EXCISION  ~ 2010   "my lower back" (08/19/2013)  . ORIF HIP FRACTURE Left    x 2 age 65 and 4  . PARTIAL NEPHRECTOMY Right 2005; ~ 2008  . PERICARDIAL WINDOW  ~ 2012  . POSTERIOR LUMBAR FUSION  2002?; 03/2012  . REDUCTION MAMMAPLASTY Bilateral ~ 2008  . RIGHT OOPHORECTOMY  ~ 2007   "it had cancer in it" (08/19/2013)  . SHOULDER ARTHROSCOPY W/ ROTATOR CUFF REPAIR Right 1990's  . TONSILLECTOMY  1954  . TOTAL HIP ARTHROPLASTY Right 02/18/2019   Procedure: TOTAL HIP ARTHROPLASTY;  Surgeon: Marchia Bond, MD;  Location: [place];  Service: Orthopedics;  Laterality: Right;  . TOTAL KNEE ARTHROPLASTY Right 1990's  . TOTAL KNEE  ARTHROPLASTY Left 08/02/2015   Procedure: LEFT TOTAL KNEE ARTHROPLASTY;  Surgeon: Paralee Cancel, MD;  [facility];  Service: Orthopedics;  Laterality: Left;  . TOTAL SHOULDER REPLACEMENT Left 2006?  . TUBAL LIGATION  1952  . VIDEO ASSISTED THORACOSCOPY (VATS)/WEDGE RESECTION Right 09/28/2017   Procedure: RIGHT VIDEO ASSISTED THORACOSCOPY (VATS)/WEDGE RESECTION, LYMPH NODE DISSECTION ;  Surgeon: Melrose Nakayama, MD;  Location: [place];  Service: Thoracic;  Laterality: Right;   Social History   Socioeconomic History  . Marital status: Married    Spouse name: Not on file  . Number of children: Not on file  . Years of education: Not on file  . Highest education level: Not on file  Occupational History  . Not on file  Social Needs  .  Financial resource strain: Not on file  . Food insecurity:    Worry: Not on file    Inability: Not on file  . Transportation needs:    Medical: Not on file    Non-medical: Not on file  Tobacco Use  . Smoking status: Former Smoker    Packs/day: 1.50    Years: 18.00    Pack years: 27.00    Types: Cigarettes    Last attempt to quit: 09/22/1985    Years since quitting: 33.5  . Smokeless tobacco: Never Used  Substance and Sexual Activity  . Alcohol use: No  . Drug use: No    Comment: prescribed  . Sexual activity: Not Currently  Lifestyle  . Physical activity:    Days per week: Not on file    Minutes per session: Not on file  . Stress: Not on file  Relationships  . Social connections:    Talks on phone: Not on file    Gets together: Not on file    Attends religious service: Not on file    Active member of club or organization: Not on file    Attends meetings of clubs or organizations: Not on file    Relationship status: Not on file  Other Topics Concern  . Not on file  Social History Narrative   Married.  Lives with husband.  Two children one boy and one girl.  Three granddaughters and two great granddaughters.    Family History  Problem Relation Age of Onset  . Cancer Mother 57       Stomach  . Cancer Father 58       Lung cancer  . Bipolar disorder Sister    Allergies  Allergen Reactions  . Amoxicillin Other (See Comments)    UNSPECIFIED REACTION  Has patient had a PCN reaction causing immediate rash, facial/tongue/throat swelling, SOB or lightheadedness with hypotension: No Has patient had a PCN reaction causing severe rash involving mucus membranes or skin necrosis: No Has patient had a PCN reaction that required hospitalization No Has patient had a PCN reaction occurring within the last 10 years: No If all of the above answers are "NO", then may proceed with Cephalosporin use.  . Ampicillin Rash and Other (See Comments)    Has patient had a PCN reaction causing  immediate rash, facial/tongue/throat swelling, SOB or lightheadedness with hypotension: No Has patient had a PCN reaction causing severe rash involving mucus membranes or skin necrosis: No Has patient had a PCN reaction that required hospitalization No Has patient had a PCN reaction occurring within the last 10 years: No If all of the above answers are "NO", then may proceed with Cephalosporin use.  Ebbie Ridge [Buspirone] Nausea  And Vomiting    Patient can tolerate   Prior to Admission medications   Medication Sig Start Date End Date Taking? Authorizing Provider  acetaminophen (TYLENOL) 325 MG tablet Take 650 mg by mouth daily as needed (pain).     [provider]  amLODipine (NORVASC) 10 MG tablet Take 10 mg by mouth daily.    [provider]  apixaban (ELIQUIS) 2.5 MG TABS tablet Take 1 tablet (2.5 mg total) by mouth 2 (two) times daily. 02/20/19   Caren Griffins, MD  buPROPion (WELLBUTRIN SR) 150 MG 12 hr tablet TAKE 1 TABLET BY MOUTH DAILY Patient taking differently: Take 150 mg by mouth daily.  12/16/18   Forrest Moron, MD  busPIRone (BUSPAR) 15 MG tablet Take 15 mg by mouth daily.  12/17/18   [provider]  cyclobenzaprine (FLEXERIL) 5 MG tablet Take 1 tablet (5 mg total) by mouth 3 (three) times daily as needed. For spasms Patient not taking: Reported on 03/23/2019 08/03/15   Danae Orleans, PA-C  diclofenac sodium (VOLTAREN) 1 % GEL Apply 2 g topically 2 (two) times daily as needed (pain).  10/25/16   [provider]  diphenoxylate-atropine (LOMOTIL) 2.5-0.025 MG tablet Take 2 tablets by mouth 4 (four) times daily as needed for diarrhea or loose stools.  12/19/18   [provider]  ferrous sulfate 325 (65 FE) MG tablet Take 325 mg by mouth daily. 12/31/18   [provider]  levothyroxine (SYNTHROID, LEVOTHROID) 100 MCG tablet TAKE 1 TABLET BY MOUTH BEFORE BREAKFAST MONDAY-FRIDAY SKIP SATURDAY ANS SUNDAY Patient taking differently:  Take 100 mcg by mouth daily.  04/26/18   Forrest Moron, MD  lisinopril (ZESTRIL) 10 MG tablet Take 10 mg by mouth daily as needed (high systolic).  01/15/19   [provider]  methocarbamol (ROBAXIN) 500 MG tablet Take 1 tablet (500 mg total) by mouth every 6 (six) hours as needed for muscle spasms. 07/18/18   Kristeen Miss, MD  morphine (MS CONTIN) 30 MG 12 hr tablet Take 1 tablet (30 mg total) by mouth every 12 (twelve) hours. 07/18/18   Kristeen Miss, MD  ondansetron (ZOFRAN) 4 MG tablet Take 4 mg by mouth 3 (three) times daily as needed for nausea/vomiting. 12/03/18   [provider]  oxyCODONE (OXY IR/ROXICODONE) 5 MG immediate release tablet Take 5 mg by mouth every 6 (six) hours as needed for pain. 03/03/19   [provider]  oxyCODONE-acetaminophen (PERCOCET/ROXICET) 5-325 MG tablet Take 1 tablet by mouth every 6 (six) hours as needed for severe pain. 03/23/19   Long, Wonda Olds, MD  pantoprazole (PROTONIX) 40 MG tablet Take 40 mg by mouth daily as needed (gerd).  01/24/19   [provider]  polyethylene glycol (MIRALAX / GLYCOLAX) packet Take 17 g by mouth 2 (two) times daily. Patient taking differently: Take 17 g by mouth daily as needed for moderate constipation.  08/03/15   Danae Orleans, PA-C  traMADol (ULTRAM) 50 MG tablet Take 1-2 tablets (50-100 mg total) by mouth every 6 (six) hours as needed (mild pain). 10/02/17   Gold, Wilder Glade, PA-C  vitamin B-12 (CYANOCOBALAMIN) 1000 MCG tablet Take 1 tablet by mouth daily.    [provider]  zolpidem (AMBIEN) 5 MG tablet Take 5 mg by mouth at bedtime.  12/02/18   [provider]     Positive ROS: All other systems have been reviewed and were otherwise negative with the exception of those mentioned in the HPI and as  above.  Physical Exam: General: Alert, no acute distress Cardiovascular: No pedal edema Respiratory: No cyanosis, no use of accessory musculature GI: No organomegaly, abdomen is  soft and non-tender Skin: No lesions in the area of chief complaint, her surgical wound is healed well Neurologic: Sensation intact distally Psychiatric: Patient is competent for consent with normal mood and affect Lymphatic: No axillary or cervical lymphadenopathy  MUSCULOSKELETAL: Right leg is shortened, EHL and FHL are intact.  Positive deformity.  Assessment: Prosthetic right hip dislocation, second time in 2 days   Plan: Plan for close reduction in the emergency room, then we will plan for admission to the hospital, medical optimization, and then revision surgery with Dr. Alvan Dame probably on Thursday.  I discussed her case with Dr. Alvan Dame already.  The risks benefits and alternatives were discussed with the patient including but not limited to the risks of nonoperative treatment, versus surgical intervention including infection, bleeding, nerve injury,  blood clots, cardiopulmonary complications, morbidity, mortality, among others, and they were willing to proceed.   Preprocedure diagnosis: Right prosthetic hip dislocation Postprocedure diagnosis: Same Procedure: Closed reduction right prosthetic hip dislocation Procedure details: After informed consent was obtained the right hip was reduced after conscious sedation performed by the emergency room physician and satisfactory reduction and restoration of leg lengths were achieved.  A knee immobilizer was placed.  Plan for admission and medical optimization in preparation for revision surgery as indicated above.   Johnny Bridge, MD Cell [phone]   03/25/2019 11:29 AM

## 2019-03-25 NOTE — ED Notes (Signed)
DG called for X-ray.

## 2019-03-26 DIAGNOSIS — T84020D Dislocation of internal right hip prosthesis, subsequent encounter: Secondary | ICD-10-CM

## 2019-03-26 LAB — CBC
HCT: 28.2 % — ABNORMAL LOW (ref 36.0–46.0)
Hemoglobin: 8.8 g/dL — ABNORMAL LOW (ref 12.0–15.0)
MCH: 30.1 pg (ref 26.0–34.0)
MCHC: 31.2 g/dL (ref 30.0–36.0)
MCV: 96.6 fL (ref 80.0–100.0)
Platelets: 171 10*3/uL (ref 150–400)
RBC: 2.92 MIL/uL — ABNORMAL LOW (ref 3.87–5.11)
RDW: 13.9 % (ref 11.5–15.5)
WBC: 6.1 10*3/uL (ref 4.0–10.5)
nRBC: 0 % (ref 0.0–0.2)

## 2019-03-26 LAB — BASIC METABOLIC PANEL
Anion gap: 3 — ABNORMAL LOW (ref 5–15)
BUN: 30 mg/dL — ABNORMAL HIGH (ref 8–23)
CO2: 24 mmol/L (ref 22–32)
Calcium: 9.2 mg/dL (ref 8.9–10.3)
Chloride: 109 mmol/L (ref 98–111)
Creatinine, Ser: 0.93 mg/dL (ref 0.44–1.00)
GFR calc Af Amer: >60 mL/min (ref >60)
GFR calc non Af Amer: >60 mL/min (ref >60)
Glucose, Bld: 102 mg/dL — ABNORMAL HIGH (ref 70–99)
Potassium: 5.1 mmol/L (ref 3.5–5.1)
Sodium: 136 mmol/L (ref 135–145)

## 2019-03-26 MED ORDER — SODIUM CHLORIDE 0.9 % IV SOLN
INTRAVENOUS | Status: DC
  Administered 2019-03-26 – 2019-03-28 (×2): via INTRAVENOUS

## 2019-03-26 NOTE — Progress Notes (Signed)
PT Cancellation Note  Patient Details Name: [name] MRN: [id number] DOB: 17-Aug-1951   Cancelled Treatment:    Reason Eval/Treat Not Completed: Medical issues which prohibited therapy(PT to check back after surgery.)   Vaishnav Demartin,KATHrine E 03/26/2019, 11:40 AM Carmelia Bake, PT, DPT Acute Rehabilitation Services Office: [phone] Pager: [phone]

## 2019-03-26 NOTE — Progress Notes (Signed)
I have seen and examined and discussed with this patient personally Briefly 68 year old female with bipolar mild systolic heart failure and hip fracture 6 weeks prior to this admission-- orthopedics saw the patient and she had a recurrent right prosthetic hip dislocation which was reduced in the emergency room and knee immobilizer was placed  She is stabilized for surgery as per Dr. Lisbeth Ply note-she has a mild hyperkalemia that was probably secondary to replacement with KCl 20 and her saline which I have replaced with IV saline 50 cc/h  I am available if needed perioperatively to assist with medical management otherwise I will sign off at this time  Verneita Griffes, MD [facility] 1:54 PM

## 2019-03-26 NOTE — Progress Notes (Signed)
OT Cancellation Note  Patient Details Name: [name] MRN: [id number] DOB: 27-Dec-1950   Cancelled Treatment:    Reason Eval/Treat Not Completed: Other (comment).  Noted pt is to have revision sx tomorrow.  Will check back after sx. Thank you.  Shyann Hefner 03/26/2019, 11:22 AM  Lesle Chris, OTR/L Acute Rehabilitation Services [phone] [facility] pager [phone] office 03/26/2019

## 2019-03-26 NOTE — Progress Notes (Addendum)
Patient ID: [name], female   DOB: Apr 26, 1951, 68 y.o.   MRN: [id number]     Subjective:  Patient reports pain as mild.  Patient in bed and in no acute distress.  Knee immmobilizer in place  Objective:   VITALS:   Vitals:   03/25/19 1700 03/25/19 1739 03/25/19 2109 03/26/19 0423  BP: (!) 110/94 112/84 (!) 104/54 (!) 112/52  Pulse: 78 80 68 68  Resp: 17 18 17 18   Temp:  98.1 F (36.7 C) (!) 97.5 F (36.4 C) 97.6 F (36.4 C)  TempSrc:  Oral Oral Oral  SpO2: 100% 100% 100% 100%  Weight:      Height:        ABD soft Sensation intact distally Dorsiflexion/Plantar flexion intact Incision: dressing C/D/I and no drainage   Lab Results  Component Value Date   WBC 6.1 03/26/2019   HGB 8.8 (L) 03/26/2019   HCT 28.2 (L) 03/26/2019   MCV 96.6 03/26/2019   PLT 171 03/26/2019   BMET    Component Value Date/Time   NA 136 03/26/2019 0625   NA 144 03/13/2018 1427   K 5.1 03/26/2019 0625   CL 109 03/26/2019 0625   CO2 24 03/26/2019 0625   GLUCOSE 102 (H) 03/26/2019 0625   BUN 30 (H) 03/26/2019 0625   BUN 42 (H) 03/13/2018 1427   CREATININE 0.93 03/26/2019 0625   CREATININE 1.27 (H) 10/31/2018 1429   CREATININE 1.33 (H) 09/02/2015 1220   CALCIUM 9.2 03/26/2019 0625   GFRNONAA >60 03/26/2019 0625   GFRNONAA 43 (L) 10/31/2018 1429   GFRNONAA 42 (L) 09/02/2015 1220   GFRAA >60 03/26/2019 0625   GFRAA 50 (L) 10/31/2018 1429   GFRAA 48 (L) 09/02/2015 1220     Assessment/Plan:     Active Problems:   Anxiety   Normocytic normochromic anemia   Essential hypertension   Failed total hip arthroplasty with dislocation (HCC)   Advance diet Up with therapy Dr Alvan Dame to consult WBAT Plan for OR on Thursday    Lunette Stands 03/26/2019, 10:45 AM  Discussed and agree with above.  Spoken with family yesterday.  Will T&S preop, and orders for surgery entered for Dr. Alvan Dame.    Marchia Bond, MD Cell [phone]

## 2019-03-26 NOTE — Consult Note (Signed)
Reason for Consult: recurrent instability right total hip  Referring Physician: Mardelle Matte, MD  [name] is an 68 y.o. female.  HPI: [name] is a 68 y.o. female who who had a right femoral neck fracture and underwent total hip replacement approximately 6 weeks ago.  2 days ago she was standing on her right leg, balancing, and trying to put on her left sock when her right hip dislocated.  She was close reduced, placed in knee immobilizer, and then discharged home. Dr. Mardelle Matte had arranged for her to get a hip spica brace although she has not received that yet.  She reports that she was wearing her knee immobilizer, and bending forward to dry off her dog after bathing her dog, and felt a pop and acute pain over the right hip with shortening of the leg, the exact same feeling she had when she dislocated the hip 2 days ago.  She came to [facility] long ER for management.  I spoke to Dr. Mardelle Matte about her case and reviewed Xrays with him.  We discussed treatment options and felt it was in her best interest to undergo revision surgery to adjust position versus further bracing or even constrained liner.   Past Medical History:  Diagnosis Date  . Anemia   . Anxiety   . Arthritis    "everywhere" (08/19/2013)  . Bilateral carpal tunnel syndrome   . Chest pain at rest, rule out pericarditis 11/27/2012  . CHF (congestive heart failure) ([place])   . Chronic kidney disease    dr Risa Grill  . Chronic lower back pain   . Closed displaced fracture of right femoral neck ([place]) 02/18/2019  . Complication of anesthesia    "woke up twice during knee replacement" (08/19/2013)  . Depression   . Dyspnea    w/ exertion    . Fibromyalgia   . Headache(784.0)    hx of migraines   . History of pericarditis, with pericardial window in 2009 11/27/2012  . Hx of ovarian cancer 11/27/2012  . Hypothyroidism 11/27/2012  . Iron deficiency anemia   . Kidney carcinoma ([place])    "both kidneys; ~ 3 yr apart" (08/19/2013)  . Lung  cancer ([place]) 2018  . Melanoma of back ([place])   . Migraines   . Pneumonia    "several times; including today", RUL/notes 08/19/2013  . Squamous carcinoma    "face, side of my nose, chest; used acid to get rid of them" (08/19/2013)  . Venous insufficiency 11/27/2012    Past Surgical History:  Procedure Laterality Date  . ABDOMINAL HYSTERECTOMY  1979  . ANTERIOR LAT LUMBAR FUSION Left 07/16/2018   Procedure: Left Lumbar two-three  Anterolateral decompression/interbody fusion with lateral plate fixation;  Surgeon: Kristeen Miss, MD;  Location: [place];  Service: Neurosurgery;  Laterality: Left;  . APPENDECTOMY    . DOPPLER ECHOCARDIOGRAPHY  02/23/2011   LVEF>50%  . FRACTURE SURGERY    . KNEE ARTHROSCOPY Right    "twice before the replacement" (08/19/2013)  . LEFT OOPHORECTOMY Left 1979  . [place]   "ruptured disc"  . MELANOMA EXCISION  ~ 2010   "my lower back" (08/19/2013)  . ORIF HIP FRACTURE Left    x 2 age 22 and 4  . PARTIAL NEPHRECTOMY Right 2005; ~ 2008  . PERICARDIAL WINDOW  ~ 2012  . POSTERIOR LUMBAR FUSION  2002?; 03/2012  . REDUCTION MAMMAPLASTY Bilateral ~ 2008  . RIGHT OOPHORECTOMY  ~ 2007   "it had cancer  in it" (08/19/2013)  . SHOULDER ARTHROSCOPY W/ ROTATOR CUFF REPAIR Right 1990's  . TONSILLECTOMY  1954  . TOTAL HIP ARTHROPLASTY Right 02/18/2019   Procedure: TOTAL HIP ARTHROPLASTY;  Surgeon: Marchia Bond, MD;  Location: [place];  Service: Orthopedics;  Laterality: Right;  . TOTAL KNEE ARTHROPLASTY Right 1990's  . TOTAL KNEE ARTHROPLASTY Left 08/02/2015   Procedure: LEFT TOTAL KNEE ARTHROPLASTY;  Surgeon: Paralee Cancel, MD;  [facility];  Service: Orthopedics;  Laterality: Left;  . TOTAL SHOULDER REPLACEMENT Left 2006?  . TUBAL LIGATION  1952  . VIDEO ASSISTED THORACOSCOPY (VATS)/WEDGE RESECTION Right 09/28/2017   Procedure: RIGHT VIDEO ASSISTED THORACOSCOPY (VATS)/WEDGE RESECTION, LYMPH NODE DISSECTION ;  Surgeon: Melrose Nakayama, MD;  Location:  [place] [facility];  Service: Thoracic;  Laterality: Right;    Family History  Problem Relation Age of Onset  . Cancer Mother 65       Stomach  . Cancer Father 1       Lung cancer  . Bipolar disorder Sister     Social History:  reports that she quit smoking about 33 years ago. Her smoking use included cigarettes. She has a 27.00 pack-year smoking history. She has never used smokeless tobacco. She reports that she does not drink alcohol or use drugs.  Allergies:  Allergies  Allergen Reactions  . Amoxicillin Other (See Comments)    UNSPECIFIED REACTION  Has patient had a PCN reaction causing immediate rash, facial/tongue/throat swelling, SOB or lightheadedness with hypotension: No Has patient had a PCN reaction causing severe rash involving mucus membranes or skin necrosis: No Has patient had a PCN reaction that required hospitalization No Has patient had a PCN reaction occurring within the last 10 years: No If all of the above answers are "NO", then may proceed with Cephalosporin use.  . Ampicillin Rash and Other (See Comments)    Has patient had a PCN reaction causing immediate rash, facial/tongue/throat swelling, SOB or lightheadedness with hypotension: No Has patient had a PCN reaction causing severe rash involving mucus membranes or skin necrosis: No Has patient had a PCN reaction that required hospitalization No Has patient had a PCN reaction occurring within the last 10 years: No If all of the above answers are "NO", then may proceed with Cephalosporin use.  Ebbie Ridge [Buspirone] Nausea And Vomiting    Patient can tolerate    Medications:  I have reviewed the patient's current medications. Scheduled: . amLODipine  10 mg Oral Daily  . buPROPion  150 mg Oral Daily  . busPIRone  15 mg Oral Daily  . docusate sodium  100 mg Oral BID  . ferrous sulfate  325 mg Oral Q lunch  . levothyroxine  100 mcg Oral Q0600  . morphine  30 mg Oral Q12H  . senna  1 tablet Oral BID  . vitamin B-12   1,000 mcg Oral Daily  . zolpidem  5 mg Oral QHS    Results for orders placed or performed during the hospital encounter of 03/25/19 (from the past 24 hour(s))  CBC     Status: Abnormal   Collection Time: 03/26/19  6:25 AM  Result Value Ref Range   WBC 6.1 4.0 - 10.5 K/uL   RBC 2.92 (L) 3.87 - 5.11 MIL/uL   Hemoglobin 8.8 (L) 12.0 - 15.0 g/dL   HCT 28.2 (L) 36.0 - 46.0 %   MCV 96.6 80.0 - 100.0 fL   MCH 30.1 26.0 - 34.0 pg   MCHC 31.2 30.0 - 36.0 g/dL  RDW 13.9 11.5 - 15.5 %   Platelets 171 150 - 400 K/uL   nRBC 0.0 0.0 - 0.2 %  Basic metabolic panel     Status: Abnormal   Collection Time: 03/26/19  6:25 AM  Result Value Ref Range   Sodium 136 135 - 145 mmol/L   Potassium 5.1 3.5 - 5.1 mmol/L   Chloride 109 98 - 111 mmol/L   CO2 24 22 - 32 mmol/L   Glucose, Bld 102 (H) 70 - 99 mg/dL   BUN 30 (H) 8 - 23 mg/dL   Creatinine, Ser 0.93 0.44 - 1.00 mg/dL   Calcium 9.2 8.9 - 10.3 mg/dL   GFR calc non Af Amer >60 >60 mL/min   GFR calc Af Amer >60 >60 mL/min   Anion gap 3 (L) 5 - 15    X-ray: DATA:  Postreduction dislocated right hip.  EXAM: DG HIP (WITH OR WITHOUT PELVIS) 1V PORT RIGHT  COMPARISON:  Radiographs 03/23/2019.  FINDINGS: Single AP view at 1144 hours. The right total hip arthroplasty appears located on this single view. No evidence of acute fracture. There is no hardware loosening. Postsurgical changes are partially imaged within the lower lumbar spine.  IMPRESSION: The previously dislocated right total hip arthroplasty appears to remain located on this single AP view. No acute osseous findings.   Electronically Signed   By: Richardean Sale M.D.  * I have noted on post op cross table lateral I am concerned that there may not be enough cup forward flexion particularly due to history of lumbar sacral fusion limiting normal functional pelvis tilt with hip flexion  ROS  As noted in her admitting H&P surrounding recent hip fracture and surgical  management and subsequent dislocations  Blood pressure (!) 120/56, pulse 82, temperature 98.2 F (36.8 C), temperature source Oral, resp. rate 14, height 5\' 6"  (1.676 m), weight 63 kg, SpO2 100 %.  Physical Exam  Awake alert comfortable Right knee immobilizer in place NVI RLE motor and sensory  Assessment/Plan: Recurrent early instability following right THR for displaced femoral neck fracture History of lumbar sacral fusion  Pan: To OR tomorrow for revision of her hip, likely just acetabular component NPO Orders placed including consent and ERAS  Mauri Pole 03/26/2019, 5:37 PM

## 2019-03-27 ENCOUNTER — Inpatient Hospital Stay (HOSPITAL_COMMUNITY): Payer: Medicare Other

## 2019-03-27 ENCOUNTER — Inpatient Hospital Stay (HOSPITAL_COMMUNITY): Payer: Medicare Other | Admitting: Anesthesiology

## 2019-03-27 ENCOUNTER — Encounter (HOSPITAL_COMMUNITY): Payer: Self-pay | Admitting: Emergency Medicine

## 2019-03-27 ENCOUNTER — Encounter (HOSPITAL_COMMUNITY)
Admission: EM | Disposition: A | Discharge status: Home / Self Care | Payer: Self-pay | Source: Home / Self Care | Attending: Orthopedic Surgery

## 2019-03-27 DIAGNOSIS — T84020A Dislocation of internal right hip prosthesis, initial encounter: Secondary | ICD-10-CM | POA: Diagnosis not present

## 2019-03-27 DIAGNOSIS — G40909 Epilepsy, unspecified, not intractable, without status epilepticus: Secondary | ICD-10-CM | POA: Diagnosis not present

## 2019-03-27 DIAGNOSIS — G4730 Sleep apnea, unspecified: Secondary | ICD-10-CM | POA: Diagnosis not present

## 2019-03-27 DIAGNOSIS — Z96649 Presence of unspecified artificial hip joint: Secondary | ICD-10-CM

## 2019-03-27 DIAGNOSIS — E876 Hypokalemia: Secondary | ICD-10-CM | POA: Diagnosis not present

## 2019-03-27 HISTORY — PX: TOTAL HIP REVISION: SHX763

## 2019-03-27 LAB — POCT I-STAT 4, (NA,K, GLUC, HGB,HCT)
Glucose, Bld: 144 mg/dL — ABNORMAL HIGH (ref 70–99)
HCT: 24 % — ABNORMAL LOW (ref 36.0–46.0)
Hemoglobin: 8.2 g/dL — ABNORMAL LOW (ref 12.0–15.0)
Potassium: 4.6 mmol/L (ref 3.5–5.1)
Sodium: 134 mmol/L — ABNORMAL LOW (ref 135–145)

## 2019-03-27 LAB — BASIC METABOLIC PANEL
Anion gap: 7 (ref 5–15)
BUN: 26 mg/dL — ABNORMAL HIGH (ref 8–23)
CO2: 23 mmol/L (ref 22–32)
Calcium: 9.5 mg/dL (ref 8.9–10.3)
Chloride: 107 mmol/L (ref 98–111)
Creatinine, Ser: 0.89 mg/dL (ref 0.44–1.00)
GFR calc Af Amer: >60 mL/min (ref >60)
GFR calc non Af Amer: >60 mL/min (ref >60)
Glucose, Bld: 103 mg/dL — ABNORMAL HIGH (ref 70–99)
Potassium: 4.1 mmol/L (ref 3.5–5.1)
Sodium: 137 mmol/L (ref 135–145)

## 2019-03-27 LAB — MRSA PCR SCREENING: MRSA by PCR: NEGATIVE

## 2019-03-27 LAB — PREPARE RBC (CROSSMATCH)

## 2019-03-27 SURGERY — TOTAL HIP REVISION
Anesthesia: General | Laterality: Right

## 2019-03-27 MED ORDER — FENTANYL CITRATE (PF) 250 MCG/5ML IJ SOLN
INTRAMUSCULAR | Status: DC | PRN
  Administered 2019-03-27 (×4): 50 ug via INTRAVENOUS
  Administered 2019-03-27: 25 ug via INTRAVENOUS
  Administered 2019-03-27 (×2): 50 ug via INTRAVENOUS
  Administered 2019-03-27: 75 ug via INTRAVENOUS

## 2019-03-27 MED ORDER — METHOCARBAMOL 500 MG IVPB - SIMPLE MED
INTRAVENOUS | Status: AC
  Administered 2019-03-27: 500 mg
  Filled 2019-03-27: qty 50

## 2019-03-27 MED ORDER — MAGNESIUM CITRATE PO SOLN: 1.0000 | Freq: Once | ORAL | Status: DC | PRN

## 2019-03-27 MED ORDER — LIDOCAINE 2% (20 MG/ML) 5 ML SYRINGE
INTRAMUSCULAR | Status: DC | PRN
  Administered 2019-03-27: 60 mg via INTRAVENOUS

## 2019-03-27 MED ORDER — MIDAZOLAM HCL 2 MG/2ML IJ SOLN
INTRAMUSCULAR | Status: AC
  Filled 2019-03-27: qty 2

## 2019-03-27 MED ORDER — METOCLOPRAMIDE HCL 5 MG PO TABS: 5.0000 mg | ORAL_TABLET | Freq: Three times a day (TID) | ORAL | Status: DC | PRN

## 2019-03-27 MED ORDER — MIDAZOLAM HCL 2 MG/2ML IJ SOLN
INTRAMUSCULAR | Status: DC | PRN
  Administered 2019-03-27: 2 mg via INTRAVENOUS

## 2019-03-27 MED ORDER — PHENYLEPHRINE 40 MCG/ML (10ML) SYRINGE FOR IV PUSH (FOR BLOOD PRESSURE SUPPORT)
PREFILLED_SYRINGE | INTRAVENOUS | Status: DC | PRN
  Administered 2019-03-27: 80 ug via INTRAVENOUS

## 2019-03-27 MED ORDER — LACTATED RINGERS IV SOLN
INTRAVENOUS | Status: DC
  Administered 2019-03-27 (×2): via INTRAVENOUS

## 2019-03-27 MED ORDER — ENSURE PRE-SURGERY PO LIQD
296.0000 mL | Freq: Once | ORAL | Status: DC
  Filled 2019-03-27: qty 296

## 2019-03-27 MED ORDER — ALUM & MAG HYDROXIDE-SIMETH 200-200-20 MG/5ML PO SUSP: 15.0000 mL | ORAL | Status: DC | PRN

## 2019-03-27 MED ORDER — MENTHOL 3 MG MT LOZG
1.0000 | LOZENGE | OROMUCOSAL | Status: DC | PRN
  Filled 2019-03-27: qty 9

## 2019-03-27 MED ORDER — PROPOFOL 10 MG/ML IV BOLUS
INTRAVENOUS | Status: AC
  Filled 2019-03-27: qty 20

## 2019-03-27 MED ORDER — FENTANYL CITRATE (PF) 100 MCG/2ML IJ SOLN
INTRAMUSCULAR | Status: AC
  Filled 2019-03-27: qty 2

## 2019-03-27 MED ORDER — DEXAMETHASONE SODIUM PHOSPHATE 10 MG/ML IJ SOLN
10.0000 mg | Freq: Once | INTRAMUSCULAR | Status: AC
  Administered 2019-03-28: 10 mg via INTRAVENOUS
  Filled 2019-03-27: qty 1

## 2019-03-27 MED ORDER — DEXAMETHASONE SODIUM PHOSPHATE 10 MG/ML IJ SOLN
INTRAMUSCULAR | Status: AC
  Filled 2019-03-27: qty 1

## 2019-03-27 MED ORDER — ONDANSETRON HCL 4 MG/2ML IJ SOLN
INTRAMUSCULAR | Status: AC
  Filled 2019-03-27: qty 2

## 2019-03-27 MED ORDER — DOCUSATE SODIUM 100 MG PO CAPS: 100.0000 mg | ORAL_CAPSULE | Freq: Two times a day (BID) | ORAL | Status: DC

## 2019-03-27 MED ORDER — TRANEXAMIC ACID-NACL 1000-0.7 MG/100ML-% IV SOLN
1000.0000 mg | INTRAVENOUS | Status: AC
  Administered 2019-03-27: 1000 mg via INTRAVENOUS
  Filled 2019-03-27: qty 100

## 2019-03-27 MED ORDER — FERROUS SULFATE 325 (65 FE) MG PO TABS
325.0000 mg | ORAL_TABLET | Freq: Three times a day (TID) | ORAL | Status: DC
  Administered 2019-03-27 – 2019-03-28 (×3): 325 mg via ORAL
  Filled 2019-03-27 (×3): qty 1

## 2019-03-27 MED ORDER — HYDROMORPHONE HCL 1 MG/ML IJ SOLN
INTRAMUSCULAR | Status: AC
  Administered 2019-03-27: 0.5 mg via INTRAVENOUS
  Filled 2019-03-27: qty 2

## 2019-03-27 MED ORDER — SUCCINYLCHOLINE CHLORIDE 200 MG/10ML IV SOSY
PREFILLED_SYRINGE | INTRAVENOUS | Status: DC | PRN
  Administered 2019-03-27: 100 mg via INTRAVENOUS

## 2019-03-27 MED ORDER — OXYCODONE HCL 5 MG PO TABS
5.0000 mg | ORAL_TABLET | Freq: Four times a day (QID) | ORAL | Status: DC | PRN
  Administered 2019-03-27: 5 mg via ORAL
  Administered 2019-03-28 (×2): 10 mg via ORAL
  Filled 2019-03-27: qty 2
  Filled 2019-03-27: qty 1
  Filled 2019-03-27: qty 2

## 2019-03-27 MED ORDER — POLYETHYLENE GLYCOL 3350 17 G PO PACK
17.0000 g | PACK | Freq: Two times a day (BID) | ORAL | Status: DC
  Filled 2019-03-27 (×2): qty 1

## 2019-03-27 MED ORDER — HYDROMORPHONE HCL 1 MG/ML IJ SOLN
INTRAMUSCULAR | Status: AC
  Administered 2019-03-27: 0.5 mg via INTRAVENOUS
  Filled 2019-03-27: qty 1

## 2019-03-27 MED ORDER — HYDROMORPHONE HCL 1 MG/ML IJ SOLN
0.2500 mg | INTRAMUSCULAR | Status: DC | PRN
  Administered 2019-03-27 (×6): 0.5 mg via INTRAVENOUS

## 2019-03-27 MED ORDER — ALBUMIN HUMAN 5 % IV SOLN
INTRAVENOUS | Status: DC | PRN
  Administered 2019-03-27: 13:00:00 via INTRAVENOUS

## 2019-03-27 MED ORDER — LIDOCAINE 2% (20 MG/ML) 5 ML SYRINGE
INTRAMUSCULAR | Status: AC
  Filled 2019-03-27: qty 5

## 2019-03-27 MED ORDER — PHENOL 1.4 % MT LIQD: 1.0000 | OROMUCOSAL | Status: DC | PRN

## 2019-03-27 MED ORDER — POVIDONE-IODINE 10 % EX SWAB
2.0000 "application " | Freq: Once | CUTANEOUS | Status: AC
  Administered 2019-03-27: 2 via TOPICAL

## 2019-03-27 MED ORDER — CHLORHEXIDINE GLUCONATE 4 % EX LIQD
60.0000 mL | Freq: Once | CUTANEOUS | Status: DC
  Filled 2019-03-27: qty 60

## 2019-03-27 MED ORDER — ONDANSETRON HCL 4 MG/2ML IJ SOLN: 4.0000 mg | Freq: Once | INTRAMUSCULAR | Status: DC | PRN

## 2019-03-27 MED ORDER — PHENYLEPHRINE 40 MCG/ML (10ML) SYRINGE FOR IV PUSH (FOR BLOOD PRESSURE SUPPORT)
PREFILLED_SYRINGE | INTRAVENOUS | Status: AC
  Filled 2019-03-27: qty 10

## 2019-03-27 MED ORDER — METOCLOPRAMIDE HCL 5 MG/ML IJ SOLN: 5.0000 mg | Freq: Three times a day (TID) | INTRAMUSCULAR | Status: DC | PRN

## 2019-03-27 MED ORDER — DEXAMETHASONE SODIUM PHOSPHATE 10 MG/ML IJ SOLN
INTRAMUSCULAR | Status: DC | PRN
  Administered 2019-03-27: 8 mg via INTRAVENOUS

## 2019-03-27 MED ORDER — ALBUMIN HUMAN 5 % IV SOLN
INTRAVENOUS | Status: AC
  Filled 2019-03-27: qty 250

## 2019-03-27 MED ORDER — SODIUM CHLORIDE 0.9 % IV SOLN
INTRAVENOUS | Status: DC
  Administered 2019-03-27: 1000 mL via INTRAVENOUS

## 2019-03-27 MED ORDER — APIXABAN 2.5 MG PO TABS
2.5000 mg | ORAL_TABLET | Freq: Two times a day (BID) | ORAL | Status: DC
  Administered 2019-03-28: 2.5 mg via ORAL
  Filled 2019-03-27: qty 1

## 2019-03-27 MED ORDER — CEFAZOLIN SODIUM-DEXTROSE 2-4 GM/100ML-% IV SOLN
2.0000 g | Freq: Four times a day (QID) | INTRAVENOUS | Status: AC
  Administered 2019-03-27 (×2): 2 g via INTRAVENOUS
  Filled 2019-03-27 (×2): qty 100

## 2019-03-27 MED ORDER — ACETAMINOPHEN 500 MG PO TABS
1000.0000 mg | ORAL_TABLET | Freq: Once | ORAL | Status: AC
  Administered 2019-03-27: 1000 mg via ORAL
  Filled 2019-03-27: qty 2

## 2019-03-27 MED ORDER — FENTANYL CITRATE (PF) 100 MCG/2ML IJ SOLN: 25.0000 ug | INTRAMUSCULAR | Status: DC | PRN

## 2019-03-27 MED ORDER — ACETAMINOPHEN 500 MG PO TABS
500.0000 mg | ORAL_TABLET | Freq: Four times a day (QID) | ORAL | Status: AC
  Administered 2019-03-27 – 2019-03-28 (×4): 500 mg via ORAL
  Filled 2019-03-27 (×5): qty 1

## 2019-03-27 MED ORDER — CEFAZOLIN SODIUM-DEXTROSE 2-4 GM/100ML-% IV SOLN
2.0000 g | INTRAVENOUS | Status: AC
  Administered 2019-03-27: 2 g via INTRAVENOUS
  Filled 2019-03-27: qty 100

## 2019-03-27 MED ORDER — ONDANSETRON HCL 4 MG/2ML IJ SOLN
INTRAMUSCULAR | Status: DC | PRN
  Administered 2019-03-27: 4 mg via INTRAVENOUS

## 2019-03-27 MED ORDER — PROPOFOL 10 MG/ML IV BOLUS
INTRAVENOUS | Status: DC | PRN
  Administered 2019-03-27: 100 mg via INTRAVENOUS

## 2019-03-27 MED ORDER — SUGAMMADEX SODIUM 200 MG/2ML IV SOLN
INTRAVENOUS | Status: AC
  Filled 2019-03-27: qty 2

## 2019-03-27 MED ORDER — PROPOFOL 10 MG/ML IV BOLUS
INTRAVENOUS | Status: AC
  Filled 2019-03-27: qty 60

## 2019-03-27 SURGICAL SUPPLY — 64 items
ARTICULEZE HEAD (Hips) ×3 IMPLANT
BAG DECANTER FOR FLEXI CONT (MISCELLANEOUS) ×3 IMPLANT
BAG ZIPLOCK 12X15 (MISCELLANEOUS) IMPLANT
BLADE SAW SGTL 11.0X1.19X90.0M (BLADE) IMPLANT
BLADE SAW SGTL 18X1.27X75 (BLADE) ×2 IMPLANT
BLADE SAW SGTL 18X1.27X75MM (BLADE) ×1
BRUSH FEMORAL CANAL (MISCELLANEOUS) IMPLANT
COVER SURGICAL LIGHT HANDLE (MISCELLANEOUS) ×3 IMPLANT
COVER WAND RF STERILE (DRAPES) IMPLANT
DERMABOND ADVANCED (GAUZE/BANDAGES/DRESSINGS) ×2
DERMABOND ADVANCED .7 DNX12 (GAUZE/BANDAGES/DRESSINGS) ×1 IMPLANT
DRAPE ORTHO SPLIT 77X108 STRL (DRAPES) ×4
DRAPE POUCH INSTRU U-SHP 10X18 (DRAPES) ×3 IMPLANT
DRAPE SURG 17X11 SM STRL (DRAPES) ×3 IMPLANT
DRAPE SURG ORHT 6 SPLT 77X108 (DRAPES) ×2 IMPLANT
DRAPE U-SHAPE 47X51 STRL (DRAPES) ×3 IMPLANT
DRESSING AQUACEL AG SP 3.5X10 (GAUZE/BANDAGES/DRESSINGS) IMPLANT
DRSG AQUACEL AG ADV 3.5X10 (GAUZE/BANDAGES/DRESSINGS) IMPLANT
DRSG AQUACEL AG ADV 3.5X14 (GAUZE/BANDAGES/DRESSINGS) ×3 IMPLANT
DRSG AQUACEL AG SP 3.5X10 (GAUZE/BANDAGES/DRESSINGS)
DURAPREP 26ML APPLICATOR (WOUND CARE) ×3 IMPLANT
ELECT BLADE TIP CTD 4 INCH (ELECTRODE) ×3 IMPLANT
ELECT REM PT RETURN 15FT ADLT (MISCELLANEOUS) ×3 IMPLANT
FACESHIELD WRAPAROUND (MASK) ×12 IMPLANT
GAUZE SPONGE 2X2 8PLY STRL LF (GAUZE/BANDAGES/DRESSINGS) ×1 IMPLANT
GLOVE BIOGEL M 7.0 STRL (GLOVE) IMPLANT
GLOVE BIOGEL PI IND STRL 7.5 (GLOVE) ×10 IMPLANT
GLOVE BIOGEL PI IND STRL 8.5 (GLOVE) ×1 IMPLANT
GLOVE BIOGEL PI INDICATOR 7.5 (GLOVE) ×20
GLOVE BIOGEL PI INDICATOR 8.5 (GLOVE) ×2
GLOVE ECLIPSE 8.0 STRL XLNG CF (GLOVE) ×6 IMPLANT
GOWN STRL REUS W/TWL 2XL LVL3 (GOWN DISPOSABLE) ×3 IMPLANT
GOWN STRL REUS W/TWL LRG LVL3 (GOWN DISPOSABLE) ×12 IMPLANT
HANDPIECE INTERPULSE COAX TIP (DISPOSABLE) ×2
HEAD ARTICULEZE (Hips) ×1 IMPLANT
KIT TURNOVER KIT A (KITS) IMPLANT
LINER NEUTRAL 52X36X52 PLUS 4 (Liner) ×3 IMPLANT
MANIFOLD NEPTUNE II (INSTRUMENTS) ×3 IMPLANT
MARKER SKIN DUAL TIP RULER LAB (MISCELLANEOUS) ×3 IMPLANT
NDL SAFETY ECLIPSE 18X1.5 (NEEDLE) ×1 IMPLANT
NEEDLE HYPO 18GX1.5 SHARP (NEEDLE) ×2
NS IRRIG 1000ML POUR BTL (IV SOLUTION) ×6 IMPLANT
PADDING CAST COTTON 6X4 STRL (CAST SUPPLIES) ×3 IMPLANT
PIN SECTOR W/GRIP ACE CUP 52MM (Hips) ×3 IMPLANT
PRESSURIZER FEMORAL UNIV (MISCELLANEOUS) IMPLANT
PROTECTOR NERVE ULNAR (MISCELLANEOUS) ×3 IMPLANT
SET HNDPC FAN SPRY TIP SCT (DISPOSABLE) ×1 IMPLANT
SPONGE GAUZE 2X2 STER 10/PKG (GAUZE/BANDAGES/DRESSINGS) ×2
SPONGE LAP 18X18 RF (DISPOSABLE) ×3 IMPLANT
SPONGE LAP 4X18 RFD (DISPOSABLE) ×3 IMPLANT
STAPLER VISISTAT 35W (STAPLE) ×3 IMPLANT
SUCTION FRAZIER HANDLE 10FR (MISCELLANEOUS) ×2
SUCTION TUBE FRAZIER 10FR DISP (MISCELLANEOUS) ×1 IMPLANT
SUT MNCRL AB 3-0 PS2 18 (SUTURE) ×3 IMPLANT
SUT STRATAFIX PDS+ 0 24IN (SUTURE) ×3 IMPLANT
SUT VIC AB 1 CT1 36 (SUTURE) ×3 IMPLANT
SUT VIC AB 2-0 CT1 27 (SUTURE) ×6
SUT VIC AB 2-0 CT1 TAPERPNT 27 (SUTURE) ×3 IMPLANT
TOWEL OR 17X26 10 PK STRL BLUE (TOWEL DISPOSABLE) ×6 IMPLANT
TOWER CARTRIDGE SMART MIX (DISPOSABLE) IMPLANT
TRAY FOLEY MTR SLVR 16FR STAT (SET/KITS/TRAYS/PACK) ×3 IMPLANT
TUBE KAMVAC SUCTION (TUBING) IMPLANT
WATER STERILE IRR 1000ML POUR (IV SOLUTION) ×3 IMPLANT
YANKAUER SUCT BULB TIP 10FT TU (MISCELLANEOUS) ×3 IMPLANT

## 2019-03-27 NOTE — Anesthesia Preprocedure Evaluation (Addendum)
Anesthesia Evaluation  Patient identified by MRN, date of birth, ID band Patient awake    Reviewed: Allergy & Precautions, NPO status , Patient's Chart, lab work & pertinent test results  Airway Mallampati: II  TM Distance: >3 FB Neck ROM: Full    Dental  (+) Edentulous Upper   Pulmonary former smoker,    Pulmonary exam normal breath sounds clear to auscultation       Cardiovascular hypertension, Pt. on medications +CHF  Normal cardiovascular exam Rhythm:Regular Rate:Normal  ECG: SR, rate 74   Neuro/Psych  Headaches, Seizures -, Well Controlled,  PSYCHIATRIC DISORDERS Anxiety Depression  Neuromuscular disease    GI/Hepatic Neg liver ROS, GERD  Medicated and Controlled,  Endo/Other  Hypothyroidism   Renal/GU negative Renal ROS     Musculoskeletal  (+) Fibromyalgia -Chronic lower back pain   Abdominal   Peds  Hematology  (+) anemia ,   Anesthesia Other Findings FAILED RIGHT HIP W/INSTABILITY  Reproductive/Obstetrics                            Anesthesia Physical Anesthesia Plan  ASA: III  Anesthesia Plan: General   Post-op Pain Management:    Induction: Intravenous  PONV Risk Score and Plan: 3 and Ondansetron, Dexamethasone, Midazolam and Treatment may vary due to age or medical condition  Airway Management Planned: Oral ETT  Additional Equipment:   Intra-op Plan:   Post-operative Plan: Extubation in OR  Informed Consent: I have reviewed the patients History and Physical, chart, labs and discussed the procedure including the risks, benefits and alternatives for the proposed anesthesia with the patient or authorized representative who has indicated his/her understanding and acceptance.     Dental advisory given  Plan Discussed with: CRNA  Anesthesia Plan Comments:         Anesthesia Quick Evaluation

## 2019-03-27 NOTE — Anesthesia Procedure Notes (Signed)
Procedure Name: Intubation Date/Time: 03/27/2019 11:58 AM Performed by: Sharlette Dense, CRNA Patient Re-evaluated:Patient Re-evaluated prior to induction Oxygen Delivery Method: Circle system utilized Preoxygenation: Pre-oxygenation with 100% oxygen Induction Type: IV induction and Rapid sequence Laryngoscope Size: Miller and 2 Grade View: Grade I Tube type: Oral Tube size: 7.0 mm Number of attempts: 1 Airway Equipment and Method: Stylet Placement Confirmation: ETT inserted through vocal cords under direct vision,  positive ETCO2 and breath sounds checked- equal and bilateral Secured at: 21 cm Tube secured with: Tape Dental Injury: Teeth and Oropharynx as per pre-operative assessment

## 2019-03-27 NOTE — Brief Op Note (Signed)
03/25/2019 - 03/27/2019  12:06 PM  PATIENT:  [name]  67 y.o. 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   Delay start of Pharmacological VTE agent (>24hrs) due to surgical blood loss or risk of bleeding: no

## 2019-03-27 NOTE — Progress Notes (Signed)
Patient ID: [name], female   DOB: 05/29/1951, 68 y.o.   MRN: [id number]   Patient remains comfortable.  No events or issues over night.  Ready to procede  AFVSS  Labs reviewed: Hgb 8.8 K 4.1 Cr .89  Failed right total hip- instability  To OR today for revision right hip, acetabular component, unless femoral side lose or malpositioned Consent signed

## 2019-03-27 NOTE — Progress Notes (Signed)
Patient came back to 5E after procedure at 1515. Alert and oriented x4, Vital signs was taken. Call light was within patient's reach.

## 2019-03-27 NOTE — Discharge Instructions (Addendum)
INSTRUCTIONS AFTER JOINT REPLACEMENT   o Remove items at home which could result in a fall. This includes throw rugs or furniture in walking pathways o ICE to the affected joint every three hours while awake for 30 minutes at a time, for at least the first 3-5 days, and then as needed for pain and swelling.  Continue to use ice for pain and swelling. You may notice swelling that will progress down to the foot and ankle.  This is normal after surgery.  Elevate your leg when you are not up walking on it.   o Continue to use the breathing machine you got in the hospital (incentive spirometer) which will help keep your temperature down.  It is common for your temperature to cycle up and down following surgery, especially at night when you are not up moving around and exerting yourself.  The breathing machine keeps your lungs expanded and your temperature down.   DIET:  As you were doing prior to hospitalization, we recommend a well-balanced diet.  DRESSING / WOUND CARE / SHOWERING  Keep the surgical dressing until follow up.  The dressing is water proof, so you can shower without any extra covering.  IF THE DRESSING FALLS OFF or the wound gets wet inside, change the dressing with sterile gauze.  Please use good hand washing techniques before changing the dressing.  Do not use any lotions or creams on the incision until instructed by your surgeon.    ACTIVITY  o Increase activity slowly as tolerated, but follow the weight bearing instructions below.   o No driving for 6 weeks or until further direction given by your physician.  You cannot drive while taking narcotics.  o No lifting or carrying greater than 10 lbs. until further directed by your surgeon. o Avoid periods of inactivity such as sitting longer than an hour when not asleep. This helps prevent blood clots.  o You may return to work once you are authorized by your doctor.     WEIGHT BEARING   Partial weight bearing with assist device as  directed.  50%   EXERCISES  Results after joint replacement surgery are often greatly improved when you follow the exercise, range of motion and muscle strengthening exercises prescribed by your doctor. Safety measures are also important to protect the joint from further injury. Any time any of these exercises cause you to have increased pain or swelling, decrease what you are doing until you are comfortable again and then slowly increase them. If you have problems or questions, call your caregiver or physical therapist for advice.   Rehabilitation is important following a joint replacement. After just a few days of immobilization, the muscles of the leg can become weakened and shrink (atrophy).  These exercises are designed to build up the tone and strength of the thigh and leg muscles and to improve motion. Often times heat used for twenty to thirty minutes before working out will loosen up your tissues and help with improving the range of motion but do not use heat for the first two weeks following surgery (sometimes heat can increase post-operative swelling).   These exercises can be done on a training (exercise) mat, on the floor, on a table or on a bed. Use whatever works the best and is most comfortable for you.    Use music or television while you are exercising so that the exercises are a pleasant break in your day. This will make your life better with the exercises  acting as a break in your routine that you can look forward to.   Perform all exercises about fifteen times, three times per day or as directed.  You should exercise both the operative leg and the other leg as well.  Exercises include:    Quad Sets - Tighten up the muscle on the front of the thigh (Quad) and hold for 5-10 seconds.    Straight Leg Raises - With your knee straight (if you were given a brace, keep it on), lift the leg to 60 degrees, hold for 3 seconds, and slowly lower the leg.  Perform this exercise against  resistance later as your leg gets stronger.   Leg Slides: Lying on your back, slowly slide your foot toward your buttocks, bending your knee up off the floor (only go as far as is comfortable). Then slowly slide your foot back down until your leg is flat on the floor again.   Angel Wings: Lying on your back spread your legs to the side as far apart as you can without causing discomfort.   Hamstring Strength:  Lying on your back, push your heel against the floor with your leg straight by tightening up the muscles of your buttocks.  Repeat, but this time bend your knee to a comfortable angle, and push your heel against the floor.  You may put a pillow under the heel to make it more comfortable if necessary.   A rehabilitation program following joint replacement surgery can speed recovery and prevent re-injury in the future due to weakened muscles. Contact your doctor or a physical therapist for more information on knee rehabilitation.    CONSTIPATION  Constipation is defined medically as fewer than three stools per week and severe constipation as less than one stool per week.  Even if you have a regular bowel pattern at home, your normal regimen is likely to be disrupted due to multiple reasons following surgery.  Combination of anesthesia, postoperative narcotics, change in appetite and fluid intake all can affect your bowels.   YOU MUST use at least one of the following options; they are listed in order of increasing strength to get the job done.  They are all available over the counter, and you may need to use some, POSSIBLY even all of these options:    Drink plenty of fluids (prune juice may be helpful) and high fiber foods Colace 100 mg by mouth twice a day  Senokot for constipation as directed and as needed Dulcolax (bisacodyl), take with full glass of water  Miralax (polyethylene glycol) once or twice a day as needed.  If you have tried all these things and are unable to have a bowel  movement in the first 3-4 days after surgery call either your surgeon or your primary doctor.    If you experience loose stools or diarrhea, hold the medications until you stool forms back up.  If your symptoms do not get better within 1 week or if they get worse, check with your doctor.  If you experience "the worst abdominal pain ever" or develop nausea or vomiting, please contact the office immediately for further recommendations for treatment.   ITCHING:  If you experience itching with your medications, try taking only a single pain pill, or even half a pain pill at a time.  You can also use Benadryl over the counter for itching or also to help with sleep.   TED HOSE STOCKINGS:  Use stockings on both legs until for at least  2 weeks or as directed by physician office. They may be removed at night for sleeping.  MEDICATIONS:  See your medication summary on the After Visit Summary that nursing will review with you.  You may have some home medications which will be placed on hold until you complete the course of blood thinner medication.  It is important for you to complete the blood thinner medication as prescribed.  PRECAUTIONS:  If you experience chest pain or shortness of breath - call 911 immediately for transfer to the hospital emergency department.   If you develop a fever greater that 101 F, purulent drainage from wound, increased redness or drainage from wound, foul odor from the wound/dressing, or calf pain - CONTACT YOUR SURGEON.                                                   FOLLOW-UP APPOINTMENTS:  If you do not already have a post-op appointment, please call the office for an appointment to be seen by your surgeon.  Guidelines for how soon to be seen are listed in your After Visit Summary, but are typically between 1-4 weeks after surgery.   MAKE SURE YOU:   Understand these instructions.   Get help right away if you are not doing well or get worse.    Thank you for  letting us be a part of your medical care team.  It is a privilege we respect greatly.  We hope these instructions will help you stay on track for a fast and full recovery!

## 2019-03-27 NOTE — Transfer of Care (Signed)
Immediate Anesthesia Transfer of Care Note  Patient: [name]  Procedure(s) Performed: TOTAL HIP REVISION (Right )  Patient Location: PACU  Anesthesia Type:General  Level of Consciousness: awake  Airway & Oxygen Therapy: Patient Spontanous Breathing and Patient connected to face mask oxygen  Post-op Assessment: Report given to RN and Post -op Vital signs reviewed and stable  Post vital signs: Reviewed and stable  Last Vitals:  Vitals Value Taken Time  BP    Temp    Pulse 108 03/27/2019  2:02 PM  Resp 19 03/27/2019  2:02 PM  SpO2 100 % 03/27/2019  2:02 PM  Vitals shown include unvalidated device data.  Last Pain:  Vitals:   03/27/19 0940  TempSrc:   PainSc: 7       Patients Stated Pain Goal: 4 (35/36/14 4315)  Complications: No apparent anesthesia complications

## 2019-03-27 NOTE — Anesthesia Postprocedure Evaluation (Signed)
Anesthesia Post Note  Patient: [name]  Procedure(s) Performed: TOTAL HIP REVISION (Right )     Patient location during evaluation: PACU Anesthesia Type: General Level of consciousness: awake and alert Pain management: pain level controlled Vital Signs Assessment: post-procedure vital signs reviewed and stable Respiratory status: spontaneous breathing, nonlabored ventilation, respiratory function stable and patient connected to nasal cannula oxygen Cardiovascular status: blood pressure returned to baseline and stable Postop Assessment: no apparent nausea or vomiting Anesthetic complications: no    Last Vitals:  Vitals:   03/27/19 1456 03/27/19 1514  BP:  139/60  Pulse: 85 84  Resp: (!) 21 20  Temp: (!) 36.4 C 36.5 C  SpO2: 100% 100%    Last Pain:  Vitals:   03/27/19 1600  TempSrc:   PainSc: Asleep                 Alilah Mcmeans P Daxtin Leiker

## 2019-03-28 ENCOUNTER — Encounter (HOSPITAL_COMMUNITY): Payer: Self-pay | Admitting: Orthopedic Surgery

## 2019-03-28 LAB — CBC
HCT: 25.6 % — ABNORMAL LOW (ref 36.0–46.0)
Hemoglobin: 7.8 g/dL — ABNORMAL LOW (ref 12.0–15.0)
MCH: 29.2 pg (ref 26.0–34.0)
MCHC: 30.5 g/dL (ref 30.0–36.0)
MCV: 95.9 fL (ref 80.0–100.0)
Platelets: 203 10*3/uL (ref 150–400)
RBC: 2.67 MIL/uL — ABNORMAL LOW (ref 3.87–5.11)
RDW: 13.7 % (ref 11.5–15.5)
WBC: 14.3 10*3/uL — ABNORMAL HIGH (ref 4.0–10.5)
nRBC: 0 % (ref 0.0–0.2)

## 2019-03-28 LAB — BASIC METABOLIC PANEL
Anion gap: 7 (ref 5–15)
BUN: 22 mg/dL (ref 8–23)
CO2: 23 mmol/L (ref 22–32)
Calcium: 9 mg/dL (ref 8.9–10.3)
Chloride: 105 mmol/L (ref 98–111)
Creatinine, Ser: 1.04 mg/dL — ABNORMAL HIGH (ref 0.44–1.00)
GFR calc Af Amer: >60 mL/min (ref >60)
GFR calc non Af Amer: 55 mL/min — ABNORMAL LOW (ref >60)
Glucose, Bld: 151 mg/dL — ABNORMAL HIGH (ref 70–99)
Potassium: 4 mmol/L (ref 3.5–5.1)
Sodium: 135 mmol/L (ref 135–145)

## 2019-03-28 MED ORDER — ACETAMINOPHEN 500 MG PO TABS: 1000.0000 mg | ORAL_TABLET | Freq: Three times a day (TID) | ORAL | 0 refills | Status: DC

## 2019-03-28 MED ORDER — DOCUSATE SODIUM 100 MG PO CAPS: 100.0000 mg | ORAL_CAPSULE | Freq: Two times a day (BID) | ORAL | 0 refills | Status: DC

## 2019-03-28 MED ORDER — OXYCODONE HCL 5 MG PO TABS: 5.0000 mg | ORAL_TABLET | ORAL | 0 refills | Status: DC | PRN

## 2019-03-28 MED ORDER — METHOCARBAMOL 500 MG PO TABS: 500.0000 mg | ORAL_TABLET | Freq: Four times a day (QID) | ORAL | 0 refills | Status: DC | PRN

## 2019-03-28 MED ORDER — POLYETHYLENE GLYCOL 3350 17 G PO PACK: 17.0000 g | PACK | Freq: Two times a day (BID) | ORAL | 0 refills | Status: DC

## 2019-03-28 MED ORDER — FERROUS SULFATE 325 (65 FE) MG PO TABS: 325.0000 mg | ORAL_TABLET | Freq: Three times a day (TID) | ORAL | 3 refills | Status: DC

## 2019-03-28 NOTE — Care Management Important Message (Signed)
Important Message  Patient Details IM letter given to Servando Snare SW to present to the Patient Name: [name] MRN: [id number] Date of Birth: [birth year]   Medicare Important Message Given:  Yes    Kerin Salen 03/28/2019, 10:01 AMImportant Message  Patient Details  Name: [name] MRN: [id number] Date of Birth: 1951/07/09   Medicare Important Message Given:  Yes    Kerin Salen 03/28/2019, 10:01 AM

## 2019-03-28 NOTE — Evaluation (Signed)
Physical Therapy Evaluation Patient Details Name: [name] MRN: [id number] DOB: 1951/02/10 Today's Date: 03/28/2019   History of Present Illness  Veryl R Schlichting is a 68 y.o. 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 PMH significant oflung cancer, kidney cancer status post partial nephrectomy CHF, CKD, fibromyalgia, history of ovarian cancer, hypothyroidism.   Clinical Impression  Pt is s/p THA revision resulting in the deficits listed below (see PT Problem List). Pt ambulated 180' with RW, no loss of balance. Reviewed posterior hip precautions in detail, as well as PWB status. Reviewed THA HEP, pt demonstrates good understanding. From PT standpoint, she is ready to DC home.       Follow Up Recommendations Follow surgeon's recommendation for DC plan and follow-up therapies    Equipment Recommendations  None recommended by PT    Recommendations for Other Services       Precautions / Restrictions Precautions Precautions: Posterior Hip Precaution Booklet Issued: Yes (comment) Precaution Comments: reviewed precautions in detail Restrictions Weight Bearing Restrictions: Yes RLE Weight Bearing: Partial weight bearing Other Position/Activity Restrictions: PWB 50% RLE      Mobility  Bed Mobility Overal bed mobility: Modified Independent             General bed mobility comments: used rails, increased time  Transfers Overall transfer level: Needs assistance Equipment used: Rolling walker (2 wheeled) Transfers: Sit to/from [facility] Sit to Stand: Supervision Stand pivot transfers: Supervision       General transfer comment: supervision for safety  Ambulation/Gait Ambulation/Gait assistance: Supervision Gait Distance (Feet): 180 Feet Assistive device: Rolling walker (2 wheeled) Gait Pattern/deviations: Step-to pattern Gait velocity: decr   General Gait Details: steady with no loss of  balance, good positioning and sequencing with RW  Stairs            Wheelchair Mobility    Modified Rankin (Stroke Patients Only)       Balance Overall balance assessment: Modified Independent                                           Pertinent Vitals/Pain Pain Assessment: 0-10 Pain Score: 5  Pain Location: R hip surgical area Pain Descriptors / Indicators: Aching Pain Intervention(s): Limited activity within patient's tolerance;Monitored during session;Premedicated before session;Ice applied    Home Living Family/patient expects to be discharged to:: Private residence Living Arrangements: Spouse/significant other Available Help at Discharge: Family;Available 24 hours/day Type of Home: House Home Access: Stairs to enter Entrance Stairs-Rails: Psychiatric nurse of Steps: 4 Home Layout: One level Home Equipment: Cane - single point;Walker - 2 wheels;Bedside commode;Adaptive equipment      Prior Function Level of Independence: Independent         Comments: reports cooking, cleaning and performing own ADL and driving     Hand Dominance   Dominant Hand: Right    Extremity/Trunk Assessment   Upper Extremity Assessment Upper Extremity Assessment: Defer to OT evaluation    Lower Extremity Assessment Lower Extremity Assessment: RLE deficits/detail RLE Deficits / Details: hip +2/5, R hip AAROM WFL within posterior hip precaution limits, R ankle/knee WNL RLE Sensation: WNL    Cervical / Trunk Assessment Cervical / Trunk Assessment: Normal  Communication   Communication: No difficulties  Cognition Arousal/Alertness: Awake/alert Behavior During Therapy: WFL for tasks assessed/performed Overall Cognitive Status: Within Functional Limits  for tasks assessed                                        General Comments      Exercises Total Joint Exercises Ankle Circles/Pumps: AROM;Both;10 reps;Supine Short Arc  Quad: AROM;Right;10 reps;Supine Heel Slides: AAROM;Right;10 reps;Supine Hip ABduction/ADduction: AAROM;Right;10 reps;Supine   Assessment/Plan    PT Assessment All further PT needs can be met in the next venue of care  PT Problem List Decreased strength;Decreased range of motion;Decreased mobility;Pain       PT Treatment Interventions      PT Goals (Current goals can be found in the Care Plan section)  Acute Rehab PT Goals Patient Stated Goal: go home today or her dog PT Goal Formulation: All assessment and education complete, DC therapy Time For Goal Achievement: 03/28/19    Frequency     Barriers to discharge        Co-evaluation               AM-PAC PT "6 Clicks" Mobility  Outcome Measure Help needed turning from your back to your side while in a flat bed without using bedrails?: None Help needed moving from lying on your back to sitting on the side of a flat bed without using bedrails?: None Help needed moving to and from a bed to a chair (including a wheelchair)?: None Help needed standing up from a chair using your arms (e.g., wheelchair or bedside chair)?: None Help needed to walk in hospital room?: None Help needed climbing 3-5 steps with a railing? : A Little 6 Click Score: 23    End of Session Equipment Utilized During Treatment: Gait belt Activity Tolerance: Patient tolerated treatment well Patient left: in bed;with call bell/phone within reach Nurse Communication: Mobility status PT Visit Diagnosis: Difficulty in walking, not elsewhere classified (R26.2);Pain Pain - Right/Left: Right Pain - part of body: Hip    Time: 1040-1104 PT Time Calculation (min) (ACUTE ONLY): 24 min   Charges:   PT Evaluation $PT Eval Low Complexity: 1 Low PT Treatments $Gait Training: 8-22 mins        Blondell Reveal Kistler PT 03/28/2019  Acute Rehabilitation Services Pager [phone] Office [phone]

## 2019-03-28 NOTE — Evaluation (Signed)
Occupational Therapy Evaluation Patient Details Name: [name] MRN: [id number] DOB: 02/06/1951 Today's Date: 03/28/2019    History of Present Illness [name] is a 68 y.o. 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 PMH significant oflung cancer, kidney cancer status post partial nephrectomy CHF, CKD, fibromyalgia, history of ovarian cancer, hypothyroidism. Pt with post surgical anemia and received 1 unit of PRBC.    Clinical Impression   Pt with impaired LB ADLs and requires sup with functional mobility using RW. PTA, pt was at home with husband and independent with selfcare, mobility, home mgt and was driving. Pt with hx of R hip replacement and has a BSC, RW and reacher at home from previous surgery. Pt will have assist from her husband and her daughter. Pt educated on 50% PWB status and R posterior hip precautions and provided with handout. All education completed and no further acute OT is indicated at this time    Follow Up Recommendations  No OT follow up;Supervision - Intermittent    Equipment Recommendations  Toilet riser;Toilet rise with handles    Recommendations for Other Services       Precautions / Restrictions Precautions Precautions: Posterior Hip Restrictions Weight Bearing Restrictions: Yes RLE Weight Bearing: Partial weight bearing Other Position/Activity Restrictions: PWB 50% RLE      Mobility Bed Mobility Overal bed mobility: Modified Independent             General bed mobility comments: used rails, increased time  Transfers Overall transfer level: Needs assistance Equipment used: Rolling walker (2 wheeled) Transfers: Sit to/from [facility] Sit to Stand: Supervision Stand pivot transfers: Supervision       General transfer comment: supervision for safety    Balance Overall balance assessment: Mild deficits observed, not formally tested                                          ADL either performed or assessed with clinical judgement   ADL Overall ADL's : Needs assistance/impaired Eating/Feeding: Independent;Sitting   Grooming: Wash/dry face;Wash/dry hands;Standing;Supervision/safety   Upper Body Bathing: Set up;Sitting   Lower Body Bathing: Minimal assistance   Upper Body Dressing : Set up;Sitting   Lower Body Dressing: Minimal assistance   Toilet Transfer: Supervision/safety;Ambulation;Comfort height toilet;Grab bars;RW   Toileting- Water quality scientist and Hygiene: Min guard;Sit to/from stand       Functional mobility during ADLs: Rolling walker;Supervision/safety       Vision Baseline Vision/History: Wears glasses Wears Glasses: Reading only Patient Visual Report: No change from baseline       Perception     Praxis      Pertinent Vitals/Pain Pain Assessment: 0-10 Pain Score: 6  Pain Location: R hip surgical area Pain Descriptors / Indicators: Aching Pain Intervention(s): Monitored during session;Premedicated before session;Repositioned     Hand Dominance Right   Extremity/Trunk Assessment Upper Extremity Assessment Upper Extremity Assessment: Overall WFL for tasks assessed   Lower Extremity Assessment Lower Extremity Assessment: Defer to PT evaluation   Cervical / Trunk Assessment Cervical / Trunk Assessment: Normal   Communication Communication Communication: No difficulties   Cognition Arousal/Alertness: Awake/alert Behavior During Therapy: WFL for tasks assessed/performed Overall Cognitive Status: Within Functional Limits for tasks assessed  General Comments       Exercises     Shoulder Instructions      Home Living Family/patient expects to be discharged to:: Private residence Living Arrangements: Spouse/significant other Available Help at Discharge: Family;Available 24 hours/day Type of Home: House Home Access:  Stairs to enter [employer] of Steps: 4 Entrance Stairs-Rails: Right;Left Home Layout: One level     Bathroom Shower/Tub: Occupational psychologist: Standard     Home Equipment: Cane - single point;Walker - 2 wheels;Bedside commode;Adaptive equipment Adaptive Equipment: Reacher        Prior Functioning/Environment Level of Independence: Independent        Comments: reports cooking, cleaning and performing own ADL and driving        OT Problem List: Decreased activity tolerance;Pain;Impaired balance (sitting and/or standing)      OT Treatment/Interventions:      OT Goals(Current goals can be found in the care plan section) Acute Rehab OT Goals Patient Stated Goal: go home today or tomorrow OT Goal Formulation: With patient  OT Frequency:     Barriers to D/C:    no barriers, pt will have assist from family at home       Co-evaluation              AM-PAC OT "6 Clicks" Daily Activity     Outcome Measure Help from another person eating meals?: None Help from another person taking care of personal grooming?: A Little Help from another person toileting, which includes using toliet, bedpan, or urinal?: A Little Help from another person bathing (including washing, rinsing, drying)?: A Little Help from another person to put on and taking off regular upper body clothing?: None Help from another person to put on and taking off regular lower body clothing?: A Little 6 Click Score: 20   End of Session Equipment Utilized During Treatment: Gait belt;Rolling walker Nurse Communication: Mobility status  Activity Tolerance: Patient tolerated treatment well Patient left: in bed  OT Visit Diagnosis: Pain;Other abnormalities of gait and mobility (R26.89) Pain - Right/Left: Right Pain - part of body: Hip                Time: 0921-0950 OT Time Calculation (min): 29 min Charges:  OT General Charges $OT Visit: 1 Visit OT Evaluation $OT Eval Moderate  Complexity: 1 Mod OT Treatments $Therapeutic Activity: 8-22 mins    Britt Bottom 03/28/2019, 10:33 AM

## 2019-03-28 NOTE — Progress Notes (Signed)
Patient ID: [name], female   DOB: 11/14/1950, 68 y.o.   MRN: [id number] Subjective: 1 Day Post-Op Procedure(s) (LRB): TOTAL HIP REVISION (Right)    Patient reports pain as mild.  No events.  Ready to get up, move and try and get home today  Objective:   VITALS:   Vitals:   03/27/19 2012 03/28/19 0508  BP: 123/60 125/66  Pulse: 87 87  Resp: 18 18  Temp: 98.4 F (36.9 C) 98.2 F (36.8 C)  SpO2: 100% 100%    Neurovascular intact Incision: dressing C/D/I - right hip  LABS Recent Labs    03/26/19 0625 03/27/19 1309  HGB 8.8* 8.2*  HCT 28.2* 24.0*  WBC 6.1  --   PLT 171  --     Recent Labs    03/26/19 0625 03/27/19 0850 03/27/19 1309  NA 136 137 134*  K 5.1 4.1 4.6  BUN 30* 26*  --   CREATININE 0.93 0.89  --   GLUCOSE 102* 103* 144*    No results for input(s): LABPT, INR in the last 72 hours.   Assessment/Plan: 1 Day Post-Op Procedure(s) (LRB): TOTAL HIP REVISION (Right)   Advance diet Up with therapy   Posterior hip precautions  PO pain meds only in preparation of discharge later today versus tomorrow Aspirin for DVT proph Home today if clears PT twice  ABLA - not a major drop in Hgb after surgery but still taking iron

## 2019-03-28 NOTE — Progress Notes (Signed)
Spoke with Adrian Prince PA to confirm that patient has been cleared for discharge per PT. PA is ok with discharge.

## 2019-03-29 NOTE — Op Note (Signed)
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  PROCEDURE:  Revision right total hip arthroplasty, acetabular component as well as liner and femoral head components used, a size 52 Gription Pinnacle shell 36+4, 10-degree face-changing liner, and a 36+5 Articul/Eze metal ball.  SURGEON:  Paralee Cancel, MD  ASSISTANT:  Danae Orleans, PA-C.  Mr. [name] was present for the entirety of the case from preoperative positioning, perioperative management of the operative extremity, general facilitation of the case, and primary wound closure.  ANESTHESIA:  General.  SPECIMENS:  None.  COMPLICATIONS:  None.  ESTIMATED BLOOD LOSS:  About 400 mL.  DRAINS:  None.  INDICATIONS:  The patient is a 68 year old female who has significant medical comorbidities and history.  She unfortunately sustained a ground-level fall about 6 weeks ago and was noted to have a displaced femoral neck fracture.  At that time, she  underwent a total hip arthroplasty through a posterior approach.  She has had recently 2 episodes of instability.  These required closed reduction in the emergency room.  After the 2nd dislocation, her case was reviewed with me by her primary surgeon.   Upon review of her x-rays including her previous lumbar staple fusion, it appeared that there may not have been enough anteversion or forward flexion of the acetabular shell at this location.  I suggested that evaluation and management surgically would  probably be in the best interest due to the recurrent dislocations.  This was reviewed with the patient and family.  Consent  was obtained for benefit of improved stability.  There is a high risk of recurrent instability in the setting of an isolated  acetabular revision as noted.  Consent was obtained for benefit of pain relief and prevention of instability.  PROCEDURE IN DETAIL:  The patient was brought to the operative theater.  Once adequate anesthesia, preoperative antibiotics, and Ancef were administered, as well as tranexamic acid, she was positioned with the left lateral decubitus position with the  right hip up.  Right lower extremity was then prepped and draped in sterile fashion.  Timeout was performed, identifying the patient, the planned procedure, and extremity.  Her old incision was identified and utilized for the procedure.  Her incision was  excised.  Soft tissue planes were created.  The iliotibial band and gluteal fascia were identified and dissected out as best as possible due to early nature of her surgery and scarring.  These were incised for posterior approach to the hip.  We  encountered a large seroma consistent with her post-surgery and recent trauma from dislocation.  This was evacuated and the posterior aspect of the hip exposed.  Capsulotomy was performed for exposure purposes of the acetabulum due to the revision nature  planned.  The hip was dislocated and the femoral head was removed.  I had elevated soft tissues off the ilium and placed the trunnion.  The trunnion was placed on the ileum and the femur retracted anteriorly.  I then evaluated the position of the  acetabulum with the body in neutral position.  There did appear to be about 45 degrees of abduction.  However, the forward flexion appeared to be neutral to about 5 degrees.  I was  unable to palpate any rim of the acetabulum anteriorly.  For this reason,  I did decide to revise her acetabulum as opposed to perform a constrained liner.  We removed the polyethylene liner, allowing [place] to remove cancellous screws in the ilium.  Once this was  done, the liner was placed back into the acetabular component, and  we used the Inn[employer] Explant System to remove the acetabular shell easily without bone loss given the fact this has only been on for 6 weeks.  I evaluated her acetabulum and felt that I was able to ream in medially.  We removed a 48 mm cup.  I then reamed  with a 29 mm reamer and then up to 51 mm.  I then selected a 52 mm Gription shell, and this was then impacted.  Due to the nature of the revision and the ability of her femur, I initially impacted it, but then had to use a bone tamp in order to set  position with an adequate amount of forward flexion.  Once I had it into what I felt was a more stable position for her hip, it was impacted down to the prepared acetabular bed.  I evaluated this routinely using the acetabular guide to make certain we  had adequate forward flexion.  I was also able to palpate a portion of her anterior rim of the acetabulum at this point.  Given these findings, I went ahead and placed a cancellous screw into the ilium.  I then used the trial reduction first with a 36+4  neutral liner and a 36+1.5 and then a 36+5 ball.  I found that her combined anteversion at this point was about 45-50 degrees.  However, with forward flexion and neutral position and internal rotation, she seemed to have some leveraging off the soft  tissues anteriorly with only about 10 or 15 degrees of internal rotation.  Even in the sleep position with her leg adducted and internally rotated, there was some subluxation, even with the 36+5 ball.  Given these findings, I dislocated the hip.  I first  debrided some of the anterior soft tissues including scar capsule anterior and proximal in this right hip in approximately the 1:00-2:00 zone.  I then retrialed with a 36+4 face-changing liner.  When I did this, the hip felt much more stable,  particularly the sleep position leg adducted and internally rotated; however, there was still some  impingement on the soft tissues anteriorly.  I felt this was going to be her best option despite the fact that with extension of about 10 or 15 degrees and  external rotation 20 degrees, there was some neck impingement on the shell.  There was no evidence of subluxation anteriorly.  Given her tendencies of a posterior dislocation with this approach, I felt that it was in her best interest to proceed with  the lip liner and provide protection posteriorly versus anterior protection.  Given these findings, I removed all the trial components. A hole eliminator was placed.  The final 36+4, 10-degree, face-changing liner was then impacted with the face-changing  portion at approximately the 9 o'clock zone on this right hip.  It was securely seated within the acetabulum.  The final 36+5 Articul/Eze metal ball was impacted on the clean and dried trunnion.  The hip was reduced.  The hip was irrigated throughout  the case and again at this point.  From a closure standpoint, no capsular repair was made based on capsulotomy for revision surgery.  I then reapproximated the iliotibial band and gluteal fascia using #1 Stratafix suture.  The remaining wound was closed  with 2-0 Vicryl and a running Monocryl stitch.  The hip was then cleaned, dried and dressed sterilely using surgical glue and an Aquacel dressing.  She was brought to the recovery room in stable condition, tolerating the procedure well.  Findings were  reviewed with the family.  We will have her be weightbearing as tolerated but will have posterior hip precautions reviewed with her over the first 3 months to provide protection until gluteal strength returns.  LN/NUANCE  D:03/29/2019 T:03/29/2019 JOB:[id number]

## 2019-03-31 LAB — BPAM RBC
Blood Product Expiration Date: 202006182359
Blood Product Expiration Date: 202006182359
Unit Type and Rh: 6200
Unit Type and Rh: 6200

## 2019-03-31 LAB — TYPE AND SCREEN
ABO/RH(D): AB POS
Antibody Screen: NEGATIVE
Unit division: 0
Unit division: 0

## 2019-04-01 NOTE — Discharge Summary (Signed)
Physician Discharge Summary  Patient ID: Holland R Dambach MRN: [id number] DOB/AGE: [birth year] 68 y.o.  Admit date: 03/25/2019 Discharge date: 03/28/2019   Procedures:  Procedure(s) (LRB): TOTAL HIP REVISION (Right)  Attending Physician:  Dr. Paralee Cancel   Admission Diagnoses:   Right hip pain / hip dislocation  Discharge Diagnoses:  Principal Problem:   S/P right TH revision Active Problems:   Anxiety   Normocytic normochromic anemia   Essential hypertension   Failed total hip arthroplasty with dislocation [facility])  Past Medical History:  Diagnosis Date  . Anemia   . Anxiety   . Arthritis    "everywhere" (08/19/2013)  . Bilateral carpal tunnel syndrome   . Chest pain at rest, rule out pericarditis 11/27/2012  . CHF (congestive heart failure) ([place])   . Chronic kidney disease    dr Risa Grill  . Chronic lower back pain   . Closed displaced fracture of right femoral neck ([place]) 02/18/2019  . Complication of anesthesia    "woke up twice during knee replacement" (08/19/2013)  . Depression   . Dyspnea    w/ exertion    . Fibromyalgia   . Headache(784.0)    hx of migraines   . History of pericarditis, with pericardial window in 2009 11/27/2012  . Hx of ovarian cancer 11/27/2012  . Hypothyroidism 11/27/2012  . Iron deficiency anemia   . Kidney carcinoma ([place])    "both kidneys; ~ 3 yr apart" (08/19/2013)  . Lung cancer ([place]) 2018  . Melanoma of back ([place])   . Migraines   . Pneumonia    "several times; including today", RUL/notes 08/19/2013  . Squamous carcinoma    "face, side of my nose, chest; used acid to get rid of them" (08/19/2013)  . Venous insufficiency 11/27/2012    HPI:    Reganne R Hellmann is a 68 y.o. female who who had a right femoral neck fracture and underwent total hip replacement approximately 6 weeks ago.  2 days ago she was standing on her right leg, balancing, and trying to put on her left sock when her right hip dislocated.  She was close reduced, placed in knee  immobilizer, and then discharged home.  Yesterday I arrange for her to get a hip spica brace although she has not received that yet.  She reports that she was wearing her knee immobilizer, and bending forward to dry off her dog after bathing her dog, and felt a pop and acute pain over the right hip with shortening of the leg, the exact same feeling she had when she dislocated the hip 2 days ago.  She came to [facility] long ER for management.  PCP: Bernerd Limbo, MD   Discharged Condition: good  Hospital Course:  Patient was admitted to the hospital on 03/25/2019.  She has an uneventful course until she underwent the above stated procedure on 03/27/2019. Patient tolerated the procedure well and brought to the recovery room in good condition and subsequently to the floor.  POD #1 BP: 125/66 ; Pulse: 87 ; Temp: 98.2 F (36.8 C) ; Resp: 18 Patient reports pain as mild.  No events.  Ready to get up, move and try and get home today. Neurovascular intact and incision: dressing C/D/I.   LABS  Basename    HGB     8.2  HCT     24.0    Discharge Exam: General appearance: alert, cooperative and no distress Extremities: Homans sign is negative, no sign of DVT, no edema, redness or  tenderness in the calves or thighs and no ulcers, gangrene or trophic changes  Disposition:  Home with follow up in 2 weeks   Follow-up Information    Paralee Cancel, MD. Schedule an appointment as soon as possible for a visit in 2 weeks.   Specialty:  Orthopedic Surgery Contact information: [address] [place] [address] [phone]           Discharge Instructions    Call MD / Call 911   Complete by:  As directed    If you experience chest pain or shortness of breath, CALL 911 and be transported to the hospital emergency room.  If you develope a fever above 101 F, pus (white drainage) or increased drainage or redness at the wound, or calf pain, call your surgeon's office.   Change dressing    Complete by:  As directed    Maintain surgical dressing until follow up in the clinic. If the edges start to pull up, may reinforce with tape. If the dressing is no longer working, may remove and cover with gauze and tape, but must keep the area dry and clean.  Call with any questions or concerns.   Constipation Prevention   Complete by:  As directed    Drink plenty of fluids.  Prune juice may be helpful.  You may use a stool softener, such as Colace (over the counter) 100 mg twice a day.  Use MiraLax (over the counter) for constipation as needed.   Diet - low sodium heart healthy   Complete by:  As directed    Discharge instructions   Complete by:  As directed    Maintain surgical dressing until follow up in the clinic. If the edges start to pull up, may reinforce with tape. If the dressing is no longer working, may remove and cover with gauze and tape, but must keep the area dry and clean.  Follow up in 2 weeks at [facility]. Call with any questions or concerns.   Increase activity slowly as tolerated   Complete by:  As directed    Partial weight bearing with assist device as directed.  50% right leg.   TED hose   Complete by:  As directed    Use stockings (TED hose) for 2 weeks on both leg(s).  You may remove them at night for sleeping.      Allergies as of 03/28/2019      Reactions   Amoxicillin Other (See Comments)   UNSPECIFIED REACTION  Has patient had a PCN reaction causing immediate rash, facial/tongue/throat swelling, SOB or lightheadedness with hypotension: No Has patient had a PCN reaction causing severe rash involving mucus membranes or skin necrosis: No Has patient had a PCN reaction that required hospitalization No Has patient had a PCN reaction occurring within the last 10 years: No If all of the above answers are "NO", then may proceed with Cephalosporin use.   Ampicillin Rash, Other (See Comments)   Has patient had a PCN reaction causing immediate rash,  facial/tongue/throat swelling, SOB or lightheadedness with hypotension: No Has patient had a PCN reaction causing severe rash involving mucus membranes or skin necrosis: No Has patient had a PCN reaction that required hospitalization No Has patient had a PCN reaction occurring within the last 10 years: No If all of the above answers are "NO", then may proceed with Cephalosporin use.   Buspar [buspirone] Nausea And Vomiting   Patient can tolerate      Medication  List    STOP taking these medications   cyclobenzaprine 5 MG tablet Commonly known as:  FLEXERIL   ibuprofen 200 MG tablet Commonly known as:  ADVIL   oxyCODONE-acetaminophen 5-325 MG tablet Commonly known as:  PERCOCET/ROXICET   traMADol 50 MG tablet Commonly known as:  ULTRAM     TAKE these medications   acetaminophen 500 MG tablet Commonly known as:  TYLENOL Take 2 tablets (1,000 mg total) by mouth every 8 (eight) hours. Notes to patient:  03/28/2019   amLODipine 10 MG tablet Commonly known as:  NORVASC Take 10 mg by mouth daily. Notes to patient:  03/29/2019   apixaban 2.5 MG Tabs tablet Commonly known as:  Eliquis Take 1 tablet (2.5 mg total) by mouth 2 (two) times daily. Notes to patient:  03/28/2019   buPROPion 150 MG 12 hr tablet Commonly known as:  WELLBUTRIN SR TAKE 1 TABLET BY MOUTH DAILY Notes to patient:  03/29/2019   busPIRone 15 MG tablet Commonly known as:  BUSPAR Take 15 mg by mouth daily. Notes to patient:  03/29/2019   diphenoxylate-atropine 2.5-0.025 MG tablet Commonly known as:  LOMOTIL Take 2 tablets by mouth 4 (four) times daily as needed for diarrhea or loose stools.   docusate sodium 100 MG capsule Commonly known as:  Colace Take 1 capsule (100 mg total) by mouth 2 (two) times daily. Notes to patient:  03/28/2019   ferrous sulfate 325 (65 FE) MG tablet Commonly known as:  FerrouSul Take 1 tablet (325 mg total) by mouth 3 (three) times daily with meals. What changed:  when to take this  Notes to patient:  03/28/2019   levothyroxine 100 MCG tablet Commonly known as:  SYNTHROID TAKE 1 TABLET BY MOUTH BEFORE BREAKFAST MONDAY-FRIDAY SKIP SATURDAY ANS SUNDAY What changed:    how much to take  how to take this  when to take this  additional instructions Notes to patient:  Keep current schedule   lisinopril 10 MG tablet Commonly known as:  ZESTRIL Take 10 mg by mouth daily as needed (high systolic).   methocarbamol 500 MG tablet Commonly known as:  Robaxin Take 1 tablet (500 mg total) by mouth every 6 (six) hours as needed for muscle spasms.   morphine 30 MG 12 hr tablet Commonly known as:  MS CONTIN Take 1 tablet (30 mg total) by mouth every 12 (twelve) hours. Notes to patient:  03/28/2019   ondansetron 4 MG tablet Commonly known as:  ZOFRAN Take 4 mg by mouth 3 (three) times daily as needed for nausea/vomiting.   oxyCODONE 5 MG immediate release tablet Commonly known as:  Oxy IR/ROXICODONE Take 1-2 tablets (5-10 mg total) by mouth every 4 (four) hours as needed for moderate pain or severe pain.   pantoprazole 40 MG tablet Commonly known as:  PROTONIX Take 40 mg by mouth daily. Notes to patient:  03/29/2019   polyethylene glycol 17 g packet Commonly known as:  MIRALAX / GLYCOLAX Take 17 g by mouth 2 (two) times daily. What changed:    when to take this  reasons to take this Notes to patient:  03/28/2019   vitamin B-12 1000 MCG tablet Commonly known as:  CYANOCOBALAMIN Take 1 tablet by mouth daily. Notes to patient:  03/29/2019   zolpidem 5 MG tablet Commonly known as:  AMBIEN Take 5 mg by mouth at bedtime.            Discharge Care Instructions  (From admission, onward)  Start     Ordered   03/28/19 0000  Change dressing    Comments:  Maintain surgical dressing until follow up in the clinic. If the edges start to pull up, may reinforce with tape. If the dressing is no longer working, may remove and cover with gauze and tape, but must  keep the area dry and clean.  Call with any questions or concerns.   03/28/19 6811           Signed: West Pugh. Cearra Portnoy   PA-C  04/01/2019, 8:13 AM

## 2019-04-15 NOTE — Telephone Encounter (Signed)
See note below

## 2019-04-23 DIAGNOSIS — Z96641 Presence of right artificial hip joint: Secondary | ICD-10-CM | POA: Diagnosis not present

## 2019-04-23 DIAGNOSIS — Z471 Aftercare following joint replacement surgery: Secondary | ICD-10-CM | POA: Diagnosis not present

## 2019-05-01 ENCOUNTER — Other Ambulatory Visit: Payer: Self-pay | Admitting: Family Medicine

## 2019-05-01 DIAGNOSIS — F13930 Sedative, hypnotic or anxiolytic use, unspecified with withdrawal, uncomplicated: Secondary | ICD-10-CM

## 2019-05-01 DIAGNOSIS — F13230 Sedative, hypnotic or anxiolytic dependence with withdrawal, uncomplicated: Secondary | ICD-10-CM

## 2019-05-02 ENCOUNTER — Ambulatory Visit (HOSPITAL_COMMUNITY): Admission: RE | Admit: 2019-05-02 | Payer: Medicare Other | Source: Ambulatory Visit

## 2019-05-02 ENCOUNTER — Inpatient Hospital Stay: Payer: Medicare Other

## 2019-05-05 ENCOUNTER — Inpatient Hospital Stay: Payer: Medicare Other | Admitting: Internal Medicine

## 2019-05-06 DIAGNOSIS — M48062 Spinal stenosis, lumbar region with neurogenic claudication: Secondary | ICD-10-CM | POA: Diagnosis not present

## 2019-05-06 DIAGNOSIS — I10 Essential (primary) hypertension: Secondary | ICD-10-CM | POA: Diagnosis not present

## 2019-05-06 DIAGNOSIS — D500 Iron deficiency anemia secondary to blood loss (chronic): Secondary | ICD-10-CM | POA: Diagnosis not present

## 2019-05-06 DIAGNOSIS — Z23 Encounter for immunization: Secondary | ICD-10-CM | POA: Diagnosis not present

## 2019-05-06 DIAGNOSIS — G894 Chronic pain syndrome: Secondary | ICD-10-CM | POA: Diagnosis not present

## 2019-05-06 DIAGNOSIS — C184 Malignant neoplasm of transverse colon: Secondary | ICD-10-CM | POA: Diagnosis not present

## 2019-05-09 ENCOUNTER — Ambulatory Visit (HOSPITAL_COMMUNITY)
Admission: RE | Admit: 2019-05-09 | Discharge: 2019-05-09 | Disposition: A | Discharge status: Home / Self Care | Payer: Medicare Other | Source: Ambulatory Visit | Attending: Internal Medicine | Admitting: Internal Medicine

## 2019-05-09 ENCOUNTER — Inpatient Hospital Stay: Payer: Medicare Other | Attending: Internal Medicine

## 2019-05-09 ENCOUNTER — Other Ambulatory Visit: Payer: Self-pay

## 2019-05-09 DIAGNOSIS — Z85118 Personal history of other malignant neoplasm of bronchus and lung: Secondary | ICD-10-CM | POA: Insufficient documentation

## 2019-05-09 DIAGNOSIS — C3490 Malignant neoplasm of unspecified part of unspecified bronchus or lung: Secondary | ICD-10-CM | POA: Diagnosis not present

## 2019-05-09 DIAGNOSIS — Z7901 Long term (current) use of anticoagulants: Secondary | ICD-10-CM | POA: Diagnosis not present

## 2019-05-09 DIAGNOSIS — I2510 Atherosclerotic heart disease of native coronary artery without angina pectoris: Secondary | ICD-10-CM | POA: Diagnosis not present

## 2019-05-09 DIAGNOSIS — E039 Hypothyroidism, unspecified: Secondary | ICD-10-CM | POA: Diagnosis not present

## 2019-05-09 DIAGNOSIS — N189 Chronic kidney disease, unspecified: Secondary | ICD-10-CM | POA: Diagnosis not present

## 2019-05-09 LAB — CBC WITH DIFFERENTIAL (CANCER CENTER ONLY)
Abs Immature Granulocytes: 0.01 10*3/uL (ref 0.00–0.07)
Basophils Absolute: 0.1 10*3/uL (ref 0.0–0.1)
Basophils Relative: 1 %
Eosinophils Absolute: 0.2 10*3/uL (ref 0.0–0.5)
Eosinophils Relative: 4 %
HCT: 30.2 % — ABNORMAL LOW (ref 36.0–46.0)
Hemoglobin: 9.3 g/dL — ABNORMAL LOW (ref 12.0–15.0)
Immature Granulocytes: 0 %
Lymphocytes Relative: 30 %
Lymphs Abs: 1.6 10*3/uL (ref 0.7–4.0)
MCH: 28.6 pg (ref 26.0–34.0)
MCHC: 30.8 g/dL (ref 30.0–36.0)
MCV: 92.9 fL (ref 80.0–100.0)
Monocytes Absolute: 0.6 10*3/uL (ref 0.1–1.0)
Monocytes Relative: 10 %
Neutro Abs: 3 10*3/uL (ref 1.7–7.7)
Neutrophils Relative %: 55 %
Platelet Count: 172 10*3/uL (ref 150–400)
RBC: 3.25 MIL/uL — ABNORMAL LOW (ref 3.87–5.11)
RDW: 13.2 % (ref 11.5–15.5)
WBC Count: 5.4 10*3/uL (ref 4.0–10.5)
nRBC: 0 % (ref 0.0–0.2)

## 2019-05-09 LAB — CMP (CANCER CENTER ONLY)
ALT: 13 U/L (ref 0–44)
AST: 19 U/L (ref 15–41)
Albumin: 3.4 g/dL — ABNORMAL LOW (ref 3.5–5.0)
Alkaline Phosphatase: 83 U/L (ref 38–126)
Anion gap: 9 (ref 5–15)
BUN: 28 mg/dL — ABNORMAL HIGH (ref 8–23)
CO2: 25 mmol/L (ref 22–32)
Calcium: 8.4 mg/dL — ABNORMAL LOW (ref 8.9–10.3)
Chloride: 107 mmol/L (ref 98–111)
Creatinine: 1.16 mg/dL — ABNORMAL HIGH (ref 0.44–1.00)
GFR, Est AFR Am: 56 mL/min — ABNORMAL LOW (ref >60)
GFR, Estimated: 48 mL/min — ABNORMAL LOW (ref >60)
Glucose, Bld: 107 mg/dL — ABNORMAL HIGH (ref 70–99)
Potassium: 4.6 mmol/L (ref 3.5–5.1)
Sodium: 141 mmol/L (ref 135–145)
Total Bilirubin: <0.2 mg/dL — ABNORMAL LOW (ref 0.3–1.2)
Total Protein: 6.6 g/dL (ref 6.5–8.1)

## 2019-05-10 ENCOUNTER — Other Ambulatory Visit: Payer: Self-pay | Admitting: Family Medicine

## 2019-05-10 DIAGNOSIS — F13930 Sedative, hypnotic or anxiolytic use, unspecified with withdrawal, uncomplicated: Secondary | ICD-10-CM

## 2019-05-10 DIAGNOSIS — F13230 Sedative, hypnotic or anxiolytic dependence with withdrawal, uncomplicated: Secondary | ICD-10-CM

## 2019-05-12 ENCOUNTER — Other Ambulatory Visit: Payer: Self-pay | Admitting: Family Medicine

## 2019-05-12 ENCOUNTER — Ambulatory Visit: Payer: Medicare Other | Admitting: Internal Medicine

## 2019-05-12 DIAGNOSIS — F13930 Sedative, hypnotic or anxiolytic use, unspecified with withdrawal, uncomplicated: Secondary | ICD-10-CM

## 2019-05-12 DIAGNOSIS — F13230 Sedative, hypnotic or anxiolytic dependence with withdrawal, uncomplicated: Secondary | ICD-10-CM

## 2019-05-13 ENCOUNTER — Encounter: Payer: Self-pay | Admitting: Internal Medicine

## 2019-05-13 ENCOUNTER — Inpatient Hospital Stay (HOSPITAL_BASED_OUTPATIENT_CLINIC_OR_DEPARTMENT_OTHER): Payer: Medicare Other | Admitting: Internal Medicine

## 2019-05-13 ENCOUNTER — Telehealth: Payer: Self-pay | Admitting: Internal Medicine

## 2019-05-13 ENCOUNTER — Other Ambulatory Visit: Payer: Self-pay

## 2019-05-13 ENCOUNTER — Telehealth: Payer: Self-pay | Admitting: Medical Oncology

## 2019-05-13 DIAGNOSIS — N189 Chronic kidney disease, unspecified: Secondary | ICD-10-CM | POA: Diagnosis not present

## 2019-05-13 DIAGNOSIS — Z7901 Long term (current) use of anticoagulants: Secondary | ICD-10-CM

## 2019-05-13 DIAGNOSIS — I2510 Atherosclerotic heart disease of native coronary artery without angina pectoris: Secondary | ICD-10-CM | POA: Diagnosis not present

## 2019-05-13 DIAGNOSIS — Z85118 Personal history of other malignant neoplasm of bronchus and lung: Secondary | ICD-10-CM

## 2019-05-13 DIAGNOSIS — E039 Hypothyroidism, unspecified: Secondary | ICD-10-CM | POA: Diagnosis not present

## 2019-05-13 DIAGNOSIS — C3490 Malignant neoplasm of unspecified part of unspecified bronchus or lung: Secondary | ICD-10-CM

## 2019-05-13 NOTE — Progress Notes (Signed)
[facility] [phone]   [phone]  OFFICE PROGRESS NOTE  Bernerd Limbo, MD [address] [place] [address]  DIAGNOSIS: Stage IA (T1a, N0, M0) non-small cell lung cancer, well-differentiated adenocarcinoma.  PRIOR THERAPY:  Status post wedge resection of the right upper lobe under the care of Dr. Roxan Hockey on September 28, 2017.  CURRENT THERAPY: Observation.  INTERVAL HISTORY: [name] 68 y.o. female returns to the clinic today for 6 months follow-up visit.  The patient is feeling fine today with no concerning complaints.  She is recently had right hip surgery by Dr. Alvan Dame.  The patient denied having any chest pain but has shortness of breath with exertion with no cough or hemoptysis.  She denied having any recent fever or chills.  She has no nausea, vomiting, diarrhea or constipation.  She denied having any headache or visual changes.  She had repeat CT scan of the chest performed recently and she is here for evaluation and discussion of her scan results.  MEDICAL HISTORY: Past Medical History:  Diagnosis Date   Anemia    Anxiety    Arthritis    "everywhere" (08/19/2013)   Bilateral carpal tunnel syndrome    Chest pain at rest, rule out pericarditis 11/27/2012   CHF (congestive heart failure) ([place])    Chronic kidney disease    dr Risa Grill   Chronic lower back pain    Closed displaced fracture of right femoral neck ([place]) 89/38/1017   Complication of anesthesia    "woke up twice during knee replacement" (08/19/2013)   Depression    Dyspnea    w/ exertion     Fibromyalgia    Headache(784.0)    hx of migraines    History of pericarditis, with pericardial window in 2009 11/27/2012   Hx of ovarian cancer 11/27/2012   Hypothyroidism 11/27/2012   Iron deficiency anemia    Kidney carcinoma ([place])    "both kidneys; ~ 3 yr apart" (08/19/2013)   Lung cancer ([place]) 2018   Melanoma of back ([place])     Migraines    Pneumonia    "several times; including today", RUL/notes 08/19/2013   Squamous carcinoma    "face, side of my nose, chest; used acid to get rid of them" (08/19/2013)   Venous insufficiency 11/27/2012    ALLERGIES:  is allergic to amoxicillin; ampicillin; and buspar [buspirone].  MEDICATIONS:  Current Outpatient Medications  Medication Sig Dispense Refill   acetaminophen (TYLENOL) 500 MG tablet Take 2 tablets (1,000 mg total) by mouth every 8 (eight) hours. 30 tablet 0   amLODipine (NORVASC) 10 MG tablet Take 10 mg by mouth daily.     apixaban (ELIQUIS) 2.5 MG TABS tablet Take 1 tablet (2.5 mg total) by mouth 2 (two) times daily. 60 tablet 0   buPROPion (WELLBUTRIN SR) 150 MG 12 hr tablet TAKE 1 TABLET BY MOUTH DAILY (Patient taking differently: Take 150 mg by mouth daily. ) 30 tablet 0   busPIRone (BUSPAR) 15 MG tablet Take 15 mg by mouth daily.      diphenoxylate-atropine (LOMOTIL) 2.5-0.025 MG tablet Take 2 tablets by mouth 4 (four) times daily as needed for diarrhea or loose stools.      docusate sodium (COLACE) 100 MG capsule Take 1 capsule (100 mg total) by mouth 2 (two) times daily. 10 capsule 0   ferrous sulfate (FERROUSUL) 325 (65 FE) MG tablet Take 1 tablet (325 mg total) by mouth 3 (three)  times daily with meals.  3   levothyroxine (SYNTHROID, LEVOTHROID) 100 MCG tablet TAKE 1 TABLET BY MOUTH BEFORE BREAKFAST MONDAY-FRIDAY SKIP SATURDAY ANS SUNDAY (Patient taking differently: Take 100 mcg by mouth daily. ) 30 tablet 3   lisinopril (ZESTRIL) 10 MG tablet Take 10 mg by mouth daily as needed (high systolic).      methocarbamol (ROBAXIN) 500 MG tablet Take 1 tablet (500 mg total) by mouth every 6 (six) hours as needed for muscle spasms. 40 tablet 0   morphine (MS CONTIN) 30 MG 12 hr tablet Take 1 tablet (30 mg total) by mouth every 12 (twelve) hours. 60 tablet 0   ondansetron (ZOFRAN) 4 MG tablet Take 4 mg by mouth 3 (three) times daily as needed for  nausea/vomiting.     oxyCODONE (OXY IR/ROXICODONE) 5 MG immediate release tablet Take 1-2 tablets (5-10 mg total) by mouth every 4 (four) hours as needed for moderate pain or severe pain. 60 tablet 0   pantoprazole (PROTONIX) 40 MG tablet Take 40 mg by mouth daily.      polyethylene glycol (MIRALAX / GLYCOLAX) 17 g packet Take 17 g by mouth 2 (two) times daily. 14 each 0   vitamin B-12 (CYANOCOBALAMIN) 1000 MCG tablet Take 1 tablet by mouth daily.     zolpidem (AMBIEN) 5 MG tablet Take 5 mg by mouth at bedtime.      No current facility-administered medications for this visit.     SURGICAL HISTORY:  Past Surgical History:  Procedure Laterality Date   ABDOMINAL HYSTERECTOMY  1979   ANTERIOR LAT LUMBAR FUSION Left 07/16/2018   Procedure: Left Lumbar two-three  Anterolateral decompression/interbody fusion with lateral plate fixation;  Surgeon: Kristeen Miss, MD;  Location: [facility];  Service: Neurosurgery;  Laterality: Left;   APPENDECTOMY     DOPPLER ECHOCARDIOGRAPHY  02/23/2011   LVEF>50%   FRACTURE SURGERY     KNEE ARTHROSCOPY Right    "twice before the replacement" (08/19/2013)   LEFT OOPHORECTOMY Left [place]   "ruptured disc"   MELANOMA EXCISION  ~ 2010   "my lower back" (08/19/2013)   ORIF HIP FRACTURE Left    x 2 age 69 and 4   PARTIAL NEPHRECTOMY Right 2005; ~ 2008   PERICARDIAL WINDOW  ~ 2012   [place]  2002?; 03/2012   REDUCTION MAMMAPLASTY Bilateral ~ 2008   RIGHT OOPHORECTOMY  ~ 2007   "it had cancer in it" (08/19/2013)   SHOULDER ARTHROSCOPY W/ ROTATOR CUFF REPAIR Right 1990's   TONSILLECTOMY  1954   TOTAL HIP ARTHROPLASTY Right 02/18/2019   Procedure: TOTAL HIP ARTHROPLASTY;  Surgeon: Marchia Bond, MD;  Location: [facility];  Service: Orthopedics;  Laterality: Right;   TOTAL HIP REVISION Right 03/27/2019   Procedure: TOTAL HIP REVISION;  Surgeon: Paralee Cancel, MD;  Location: [facility];  Service: Orthopedics;   Laterality: Right;   TOTAL KNEE ARTHROPLASTY Right 1990's   TOTAL KNEE ARTHROPLASTY Left 08/02/2015   Procedure: LEFT TOTAL KNEE ARTHROPLASTY;  Surgeon: Paralee Cancel, MD;  Location: [facility];  Service: Orthopedics;  Laterality: Left;   TOTAL SHOULDER REPLACEMENT Left 2006?   TUBAL LIGATION  1952   VIDEO ASSISTED THORACOSCOPY (VATS)/WEDGE RESECTION Right 09/28/2017   Procedure: RIGHT VIDEO ASSISTED THORACOSCOPY (VATS)/WEDGE RESECTION, LYMPH NODE DISSECTION ;  Surgeon: Melrose Nakayama, MD;  Location: [facility];  Service: Thoracic;  Laterality: Right;    REVIEW OF SYSTEMS:  A comprehensive review of systems was negative  except for: Constitutional: positive for fatigue   PHYSICAL EXAMINATION: General appearance: alert, cooperative, fatigued and no distress Head: Normocephalic, without obvious abnormality, atraumatic Neck: no adenopathy, no JVD, supple, symmetrical, trachea midline and thyroid not enlarged, symmetric, no tenderness/mass/nodules Lymph nodes: Cervical, supraclavicular, and axillary nodes normal. Resp: clear to auscultation bilaterally Back: symmetric, no curvature. ROM normal. No CVA tenderness. Cardio: regular rate and rhythm, S1, S2 normal, no murmur, click, rub or gallop GI: soft, non-tender; bowel sounds normal; no masses,  no organomegaly Extremities: extremities normal, atraumatic, no cyanosis or edema  ECOG PERFORMANCE STATUS: 1 - Symptomatic but completely ambulatory  There were no vitals taken for this visit.  LABORATORY DATA: Lab Results  Component Value Date   WBC 5.4 05/09/2019   HGB 9.3 (L) 05/09/2019   HCT 30.2 (L) 05/09/2019   MCV 92.9 05/09/2019   PLT 172 05/09/2019      Chemistry      Component Value Date/Time   NA 141 05/09/2019 1336   NA 144 03/13/2018 1427   K 4.6 05/09/2019 1336   CL 107 05/09/2019 1336   CO2 25 05/09/2019 1336   BUN 28 (H) 05/09/2019 1336   BUN 42 (H) 03/13/2018 1427   CREATININE 1.16 (H) 05/09/2019 1336   CREATININE  1.33 (H) 09/02/2015 1220      Component Value Date/Time   CALCIUM 8.4 (L) 05/09/2019 1336   ALKPHOS 83 05/09/2019 1336   AST 19 05/09/2019 1336   ALT 13 05/09/2019 1336   BILITOT <0.2 (L) 05/09/2019 1336       RADIOGRAPHIC STUDIES: Ct Chest Wo Contrast  Result Date: 05/09/2019 CLINICAL DATA:  Right upper lobe wedge resection for non-small-cell lung cancer. EXAM: CT CHEST WITHOUT CONTRAST TECHNIQUE: Multidetector CT imaging of the chest was performed following the standard protocol without IV contrast. COMPARISON:  10/31/2018 FINDINGS: Cardiovascular: The heart size is normal. No substantial pericardial effusion. Coronary artery calcification is evident. Atherosclerotic calcification is noted in the wall of the thoracic aorta. Mediastinum/Nodes: No mediastinal lymphadenopathy. No evidence for gross hilar lymphadenopathy although assessment is limited by the lack of intravenous contrast on today's study. The esophagus has normal imaging features. There is no axillary lymphadenopathy. Lungs/Pleura: Interval development of a new region of ground-glass attenuation in the posterior right upper lobe with several associated subtle nodular opacities. This is just cranial to the staple line. Patient has also developed a cluster of ground-glass nodules in the right lower lobe ranging in size from 3-4 mm up to about 8 mm (see image 71/series 7). 5 mm right upper lobe nodule on 34/7 is new in the interval. 6 mm nodule along the inferior aspect of the staple line (46/7) is also new since prior study. 5 mm right lower lobe nodule on 102/7 is new. No pleural effusion. Upper Abdomen: Fatty change in the tail of pancreas is stable since prior study and comparing back to 04/29/2018. Musculoskeletal: No worrisome lytic or sclerotic osseous abnormality. Status post left shoulder replacement. Degenerative changes noted right shoulder. IMPRESSION: 1. New ground-glass attenuation with clustered ill-defined ground-glass  nodules in the right upper and lower lobes. Imaging features suggesting affection/inflammatory etiology, but close follow-up recommended. 2. New confluent tiny soft tissue nodules identified in the right upper lobe and right lower lobe. These may also be part of the above described process, but given the more solid appearance, close attention recommended as metastatic disease not excluded. 3.  Aortic Atherosclerois (ICD10-170.0) Electronically Signed   By: Verda Cumins.D.  On: 05/09/2019 16:53    ASSESSMENT AND PLAN: This is a very pleasant 68 years old white female with history of a stage IA non-small cell lung cancer, adenocarcinoma status post wedge resection of the right upper lobe in December 2018.   The patient has been on observation since that time and she is feeling fine. She had repeat CT scan of the chest performed recently that showed no concerning findings for disease progression but there was new groundglass involving the right upper and lower lobes suspicious for inflammatory process but malignancy cannot be completely excluded. I recommended for the patient to continue on observation for now with repeat CT scan of the chest in 6 months for restaging of her disease. She was advised to call immediately if she has any concerning symptoms in the interval. The patient voices understanding of current disease status and treatment options and is in agreement with the current care plan. All questions were answered. The patient knows to call the clinic with any problems, questions or concerns. We can certainly see the patient much sooner if necessary.  I spent 10 minutes counseling the patient face to face. The total time spent in the appointment was 15 minutes.  Disclaimer: This note was dictated with voice recognition software. Similar sounding words can inadvertently be transcribed and may not be corrected upon review.

## 2019-05-13 NOTE — Telephone Encounter (Signed)
Scheduled apt per 7/21 los - reminder letter mailed with appt date and time

## 2019-05-13 NOTE — Telephone Encounter (Signed)
Pt in lobby.

## 2019-05-14 ENCOUNTER — Telehealth: Payer: Self-pay | Admitting: Medical Oncology

## 2019-05-14 NOTE — Telephone Encounter (Signed)
Asking for summary of yesterday's visit. Reviewed with pt .   She has concern that  "Six months is a long time to wait if it is cancer".  I instructed her to call for any symptoms: new cough, hemoptysis, SOB, pain.

## 2019-05-15 DIAGNOSIS — I10 Essential (primary) hypertension: Secondary | ICD-10-CM | POA: Diagnosis not present

## 2019-05-15 DIAGNOSIS — C184 Malignant neoplasm of transverse colon: Secondary | ICD-10-CM | POA: Diagnosis not present

## 2019-05-15 DIAGNOSIS — K56699 Other intestinal obstruction unspecified as to partial versus complete obstruction: Secondary | ICD-10-CM | POA: Diagnosis not present

## 2019-05-27 DIAGNOSIS — G894 Chronic pain syndrome: Secondary | ICD-10-CM | POA: Diagnosis not present

## 2019-05-27 DIAGNOSIS — R42 Dizziness and giddiness: Secondary | ICD-10-CM | POA: Diagnosis not present

## 2019-05-27 DIAGNOSIS — D649 Anemia, unspecified: Secondary | ICD-10-CM | POA: Diagnosis not present

## 2019-05-27 DIAGNOSIS — I10 Essential (primary) hypertension: Secondary | ICD-10-CM | POA: Diagnosis not present

## 2019-05-27 DIAGNOSIS — C184 Malignant neoplasm of transverse colon: Secondary | ICD-10-CM | POA: Diagnosis not present

## 2019-06-03 DIAGNOSIS — K838 Other specified diseases of biliary tract: Secondary | ICD-10-CM | POA: Diagnosis not present

## 2019-06-03 DIAGNOSIS — K56699 Other intestinal obstruction unspecified as to partial versus complete obstruction: Secondary | ICD-10-CM | POA: Diagnosis not present

## 2019-06-05 DIAGNOSIS — M5116 Intervertebral disc disorders with radiculopathy, lumbar region: Secondary | ICD-10-CM | POA: Diagnosis not present

## 2019-06-05 DIAGNOSIS — M5416 Radiculopathy, lumbar region: Secondary | ICD-10-CM | POA: Diagnosis not present

## 2019-06-05 DIAGNOSIS — Z981 Arthrodesis status: Secondary | ICD-10-CM | POA: Diagnosis not present

## 2019-07-03 DIAGNOSIS — M25551 Pain in right hip: Secondary | ICD-10-CM | POA: Diagnosis not present

## 2019-07-03 DIAGNOSIS — M7061 Trochanteric bursitis, right hip: Secondary | ICD-10-CM | POA: Diagnosis not present

## 2019-07-03 DIAGNOSIS — Z96642 Presence of left artificial hip joint: Secondary | ICD-10-CM | POA: Diagnosis not present

## 2019-07-03 DIAGNOSIS — Z471 Aftercare following joint replacement surgery: Secondary | ICD-10-CM | POA: Diagnosis not present

## 2019-07-14 DIAGNOSIS — C184 Malignant neoplasm of transverse colon: Secondary | ICD-10-CM | POA: Diagnosis not present

## 2019-07-14 DIAGNOSIS — Z01810 Encounter for preprocedural cardiovascular examination: Secondary | ICD-10-CM | POA: Diagnosis not present

## 2019-07-14 DIAGNOSIS — Z85118 Personal history of other malignant neoplasm of bronchus and lung: Secondary | ICD-10-CM | POA: Diagnosis not present

## 2019-07-14 DIAGNOSIS — G8929 Other chronic pain: Secondary | ICD-10-CM | POA: Diagnosis not present

## 2019-07-14 DIAGNOSIS — K56699 Other intestinal obstruction unspecified as to partial versus complete obstruction: Secondary | ICD-10-CM | POA: Diagnosis not present

## 2019-07-14 DIAGNOSIS — I10 Essential (primary) hypertension: Secondary | ICD-10-CM | POA: Diagnosis not present

## 2019-07-14 DIAGNOSIS — Z01812 Encounter for preprocedural laboratory examination: Secondary | ICD-10-CM | POA: Diagnosis not present

## 2019-07-14 DIAGNOSIS — E039 Hypothyroidism, unspecified: Secondary | ICD-10-CM | POA: Diagnosis not present

## 2019-07-14 DIAGNOSIS — F419 Anxiety disorder, unspecified: Secondary | ICD-10-CM | POA: Diagnosis not present

## 2019-07-14 DIAGNOSIS — Z8051 Family history of malignant neoplasm of kidney: Secondary | ICD-10-CM | POA: Diagnosis not present

## 2019-07-20 DIAGNOSIS — Z01818 Encounter for other preprocedural examination: Secondary | ICD-10-CM | POA: Diagnosis not present

## 2019-07-24 DIAGNOSIS — Z96612 Presence of left artificial shoulder joint: Secondary | ICD-10-CM | POA: Diagnosis present

## 2019-07-24 DIAGNOSIS — K565 Intestinal adhesions [bands], unspecified as to partial versus complete obstruction: Secondary | ICD-10-CM | POA: Diagnosis not present

## 2019-07-24 DIAGNOSIS — F411 Generalized anxiety disorder: Secondary | ICD-10-CM | POA: Diagnosis present

## 2019-07-24 DIAGNOSIS — K922 Gastrointestinal hemorrhage, unspecified: Secondary | ICD-10-CM | POA: Diagnosis not present

## 2019-07-24 DIAGNOSIS — I509 Heart failure, unspecified: Secondary | ICD-10-CM | POA: Diagnosis present

## 2019-07-24 DIAGNOSIS — K660 Peritoneal adhesions (postprocedural) (postinfection): Secondary | ICD-10-CM | POA: Diagnosis not present

## 2019-07-24 DIAGNOSIS — Z8543 Personal history of malignant neoplasm of ovary: Secondary | ICD-10-CM | POA: Diagnosis not present

## 2019-07-24 DIAGNOSIS — K56699 Other intestinal obstruction unspecified as to partial versus complete obstruction: Secondary | ICD-10-CM | POA: Diagnosis not present

## 2019-07-24 DIAGNOSIS — Z880 Allergy status to penicillin: Secondary | ICD-10-CM | POA: Diagnosis not present

## 2019-07-24 DIAGNOSIS — G894 Chronic pain syndrome: Secondary | ICD-10-CM | POA: Diagnosis present

## 2019-07-24 DIAGNOSIS — N189 Chronic kidney disease, unspecified: Secondary | ICD-10-CM | POA: Diagnosis present

## 2019-07-24 DIAGNOSIS — M48062 Spinal stenosis, lumbar region with neurogenic claudication: Secondary | ICD-10-CM | POA: Diagnosis not present

## 2019-07-24 DIAGNOSIS — M47817 Spondylosis without myelopathy or radiculopathy, lumbosacral region: Secondary | ICD-10-CM | POA: Diagnosis present

## 2019-07-24 DIAGNOSIS — Z85528 Personal history of other malignant neoplasm of kidney: Secondary | ICD-10-CM | POA: Diagnosis not present

## 2019-07-24 DIAGNOSIS — Z79899 Other long term (current) drug therapy: Secondary | ICD-10-CM | POA: Diagnosis not present

## 2019-07-24 DIAGNOSIS — Z96641 Presence of right artificial hip joint: Secondary | ICD-10-CM | POA: Diagnosis present

## 2019-07-24 DIAGNOSIS — Z87891 Personal history of nicotine dependence: Secondary | ICD-10-CM | POA: Diagnosis not present

## 2019-07-24 DIAGNOSIS — Z9889 Other specified postprocedural states: Secondary | ICD-10-CM | POA: Diagnosis not present

## 2019-07-24 DIAGNOSIS — D638 Anemia in other chronic diseases classified elsewhere: Secondary | ICD-10-CM | POA: Diagnosis present

## 2019-07-24 DIAGNOSIS — Z85828 Personal history of other malignant neoplasm of skin: Secondary | ICD-10-CM | POA: Diagnosis not present

## 2019-07-24 DIAGNOSIS — Z9049 Acquired absence of other specified parts of digestive tract: Secondary | ICD-10-CM | POA: Diagnosis not present

## 2019-07-24 DIAGNOSIS — K529 Noninfective gastroenteritis and colitis, unspecified: Secondary | ICD-10-CM | POA: Diagnosis not present

## 2019-07-24 DIAGNOSIS — K5651 Intestinal adhesions [bands], with partial obstruction: Secondary | ICD-10-CM | POA: Diagnosis not present

## 2019-07-24 DIAGNOSIS — Z905 Acquired absence of kidney: Secondary | ICD-10-CM | POA: Diagnosis not present

## 2019-07-24 DIAGNOSIS — D62 Acute posthemorrhagic anemia: Secondary | ICD-10-CM | POA: Diagnosis not present

## 2019-07-24 DIAGNOSIS — R339 Retention of urine, unspecified: Secondary | ICD-10-CM | POA: Diagnosis not present

## 2019-07-24 DIAGNOSIS — E039 Hypothyroidism, unspecified: Secondary | ICD-10-CM | POA: Diagnosis present

## 2019-07-24 DIAGNOSIS — I130 Hypertensive heart and chronic kidney disease with heart failure and stage 1 through stage 4 chronic kidney disease, or unspecified chronic kidney disease: Secondary | ICD-10-CM | POA: Diagnosis present

## 2019-07-24 DIAGNOSIS — Z96653 Presence of artificial knee joint, bilateral: Secondary | ICD-10-CM | POA: Diagnosis present

## 2019-07-24 DIAGNOSIS — Z85118 Personal history of other malignant neoplasm of bronchus and lung: Secondary | ICD-10-CM | POA: Diagnosis not present

## 2019-07-24 HISTORY — PX: COLON RESECTION: SHX5231

## 2019-07-30 DIAGNOSIS — E039 Hypothyroidism, unspecified: Secondary | ICD-10-CM | POA: Diagnosis not present

## 2019-07-30 DIAGNOSIS — F411 Generalized anxiety disorder: Secondary | ICD-10-CM | POA: Diagnosis not present

## 2019-07-30 DIAGNOSIS — Z85528 Personal history of other malignant neoplasm of kidney: Secondary | ICD-10-CM | POA: Diagnosis not present

## 2019-07-30 DIAGNOSIS — Z87891 Personal history of nicotine dependence: Secondary | ICD-10-CM | POA: Diagnosis not present

## 2019-07-30 DIAGNOSIS — Z79899 Other long term (current) drug therapy: Secondary | ICD-10-CM | POA: Diagnosis not present

## 2019-07-30 DIAGNOSIS — Z801 Family history of malignant neoplasm of trachea, bronchus and lung: Secondary | ICD-10-CM | POA: Diagnosis not present

## 2019-07-30 DIAGNOSIS — Z85118 Personal history of other malignant neoplasm of bronchus and lung: Secondary | ICD-10-CM | POA: Diagnosis not present

## 2019-07-30 DIAGNOSIS — Z85828 Personal history of other malignant neoplasm of skin: Secondary | ICD-10-CM | POA: Diagnosis not present

## 2019-07-30 DIAGNOSIS — Z833 Family history of diabetes mellitus: Secondary | ICD-10-CM | POA: Diagnosis not present

## 2019-07-30 DIAGNOSIS — N289 Disorder of kidney and ureter, unspecified: Secondary | ICD-10-CM | POA: Diagnosis not present

## 2019-07-30 DIAGNOSIS — Z23 Encounter for immunization: Secondary | ICD-10-CM | POA: Diagnosis not present

## 2019-07-30 DIAGNOSIS — Z880 Allergy status to penicillin: Secondary | ICD-10-CM | POA: Diagnosis not present

## 2019-07-30 DIAGNOSIS — Z800 Family history of malignant neoplasm of digestive organs: Secondary | ICD-10-CM | POA: Diagnosis not present

## 2019-07-30 DIAGNOSIS — K56699 Other intestinal obstruction unspecified as to partial versus complete obstruction: Secondary | ICD-10-CM | POA: Diagnosis not present

## 2019-07-30 DIAGNOSIS — E860 Dehydration: Secondary | ICD-10-CM | POA: Diagnosis not present

## 2019-07-30 DIAGNOSIS — Z8543 Personal history of malignant neoplasm of ovary: Secondary | ICD-10-CM | POA: Diagnosis not present

## 2019-07-30 DIAGNOSIS — E876 Hypokalemia: Secondary | ICD-10-CM | POA: Diagnosis not present

## 2019-07-30 DIAGNOSIS — D649 Anemia, unspecified: Secondary | ICD-10-CM | POA: Diagnosis not present

## 2019-07-31 DIAGNOSIS — K56699 Other intestinal obstruction unspecified as to partial versus complete obstruction: Secondary | ICD-10-CM | POA: Diagnosis not present

## 2019-07-31 DIAGNOSIS — N289 Disorder of kidney and ureter, unspecified: Secondary | ICD-10-CM | POA: Diagnosis not present

## 2019-07-31 DIAGNOSIS — F411 Generalized anxiety disorder: Secondary | ICD-10-CM | POA: Diagnosis not present

## 2019-07-31 DIAGNOSIS — E039 Hypothyroidism, unspecified: Secondary | ICD-10-CM | POA: Diagnosis not present

## 2019-07-31 DIAGNOSIS — E860 Dehydration: Secondary | ICD-10-CM | POA: Diagnosis not present

## 2019-07-31 DIAGNOSIS — E876 Hypokalemia: Secondary | ICD-10-CM | POA: Diagnosis not present

## 2019-07-31 DIAGNOSIS — D649 Anemia, unspecified: Secondary | ICD-10-CM | POA: Diagnosis not present

## 2019-08-20 DIAGNOSIS — M48062 Spinal stenosis, lumbar region with neurogenic claudication: Secondary | ICD-10-CM | POA: Diagnosis not present

## 2019-08-20 DIAGNOSIS — K56699 Other intestinal obstruction unspecified as to partial versus complete obstruction: Secondary | ICD-10-CM | POA: Diagnosis not present

## 2019-08-20 DIAGNOSIS — G894 Chronic pain syndrome: Secondary | ICD-10-CM | POA: Diagnosis not present

## 2019-08-20 DIAGNOSIS — I10 Essential (primary) hypertension: Secondary | ICD-10-CM | POA: Diagnosis not present

## 2019-08-22 DIAGNOSIS — K56699 Other intestinal obstruction unspecified as to partial versus complete obstruction: Secondary | ICD-10-CM | POA: Diagnosis not present

## 2019-08-27 DIAGNOSIS — N289 Disorder of kidney and ureter, unspecified: Secondary | ICD-10-CM | POA: Diagnosis not present

## 2019-08-27 DIAGNOSIS — R5383 Other fatigue: Secondary | ICD-10-CM | POA: Diagnosis not present

## 2019-08-27 DIAGNOSIS — D649 Anemia, unspecified: Secondary | ICD-10-CM | POA: Diagnosis not present

## 2019-08-27 DIAGNOSIS — I10 Essential (primary) hypertension: Secondary | ICD-10-CM | POA: Diagnosis not present

## 2019-08-27 DIAGNOSIS — N189 Chronic kidney disease, unspecified: Secondary | ICD-10-CM | POA: Diagnosis not present

## 2019-09-04 DIAGNOSIS — N289 Disorder of kidney and ureter, unspecified: Secondary | ICD-10-CM | POA: Diagnosis not present

## 2019-09-17 ENCOUNTER — Telehealth: Payer: Self-pay | Admitting: Internal Medicine

## 2019-09-17 NOTE — Telephone Encounter (Signed)
Returned patient's phone call regarding rescheduling January appointments, explained to patient as to why it was scheduled the way it is. Patient will keep appointment as is.

## 2019-10-01 DIAGNOSIS — D649 Anemia, unspecified: Secondary | ICD-10-CM | POA: Diagnosis not present

## 2019-11-11 ENCOUNTER — Other Ambulatory Visit: Payer: Self-pay

## 2019-11-11 ENCOUNTER — Ambulatory Visit (HOSPITAL_COMMUNITY)
Admission: RE | Admit: 2019-11-11 | Discharge: 2019-11-11 | Disposition: A | Discharge status: Home / Self Care | Payer: Medicare Other | Source: Ambulatory Visit | Attending: Internal Medicine | Admitting: Internal Medicine

## 2019-11-11 DIAGNOSIS — C3490 Malignant neoplasm of unspecified part of unspecified bronchus or lung: Secondary | ICD-10-CM | POA: Diagnosis not present

## 2019-11-13 ENCOUNTER — Inpatient Hospital Stay: Payer: Medicare Other | Attending: Internal Medicine

## 2019-11-13 ENCOUNTER — Other Ambulatory Visit: Payer: Self-pay

## 2019-11-13 DIAGNOSIS — I130 Hypertensive heart and chronic kidney disease with heart failure and stage 1 through stage 4 chronic kidney disease, or unspecified chronic kidney disease: Secondary | ICD-10-CM | POA: Insufficient documentation

## 2019-11-13 DIAGNOSIS — Z79899 Other long term (current) drug therapy: Secondary | ICD-10-CM | POA: Diagnosis not present

## 2019-11-13 DIAGNOSIS — N189 Chronic kidney disease, unspecified: Secondary | ICD-10-CM | POA: Diagnosis not present

## 2019-11-13 DIAGNOSIS — C3411 Malignant neoplasm of upper lobe, right bronchus or lung: Secondary | ICD-10-CM | POA: Diagnosis present

## 2019-11-13 DIAGNOSIS — I509 Heart failure, unspecified: Secondary | ICD-10-CM | POA: Diagnosis not present

## 2019-11-13 DIAGNOSIS — C3490 Malignant neoplasm of unspecified part of unspecified bronchus or lung: Secondary | ICD-10-CM

## 2019-11-13 LAB — CMP (CANCER CENTER ONLY)
ALT: 10 U/L (ref 0–44)
AST: 17 U/L (ref 15–41)
Albumin: 3.9 g/dL (ref 3.5–5.0)
Alkaline Phosphatase: 88 U/L (ref 38–126)
Anion gap: 8 (ref 5–15)
BUN: 21 mg/dL (ref 8–23)
CO2: 26 mmol/L (ref 22–32)
Calcium: 9.7 mg/dL (ref 8.9–10.3)
Chloride: 105 mmol/L (ref 98–111)
Creatinine: 1.16 mg/dL — ABNORMAL HIGH (ref 0.44–1.00)
GFR, Est AFR Am: 55 mL/min — ABNORMAL LOW (ref >60)
GFR, Estimated: 48 mL/min — ABNORMAL LOW (ref >60)
Glucose, Bld: 93 mg/dL (ref 70–99)
Potassium: 4.3 mmol/L (ref 3.5–5.1)
Sodium: 139 mmol/L (ref 135–145)
Total Bilirubin: 0.4 mg/dL (ref 0.3–1.2)
Total Protein: 6.9 g/dL (ref 6.5–8.1)

## 2019-11-13 LAB — CBC WITH DIFFERENTIAL (CANCER CENTER ONLY)
Abs Immature Granulocytes: 0.01 10*3/uL (ref 0.00–0.07)
Basophils Absolute: 0 10*3/uL (ref 0.0–0.1)
Basophils Relative: 1 %
Eosinophils Absolute: 0.1 10*3/uL (ref 0.0–0.5)
Eosinophils Relative: 1 %
HCT: 34 % — ABNORMAL LOW (ref 36.0–46.0)
Hemoglobin: 10.8 g/dL — ABNORMAL LOW (ref 12.0–15.0)
Immature Granulocytes: 0 %
Lymphocytes Relative: 25 %
Lymphs Abs: 1.4 10*3/uL (ref 0.7–4.0)
MCH: 28.1 pg (ref 26.0–34.0)
MCHC: 31.8 g/dL (ref 30.0–36.0)
MCV: 88.5 fL (ref 80.0–100.0)
Monocytes Absolute: 0.4 10*3/uL (ref 0.1–1.0)
Monocytes Relative: 7 %
Neutro Abs: 3.7 10*3/uL (ref 1.7–7.7)
Neutrophils Relative %: 66 %
Platelet Count: 187 10*3/uL (ref 150–400)
RBC: 3.84 MIL/uL — ABNORMAL LOW (ref 3.87–5.11)
RDW: 13.8 % (ref 11.5–15.5)
WBC Count: 5.6 10*3/uL (ref 4.0–10.5)
nRBC: 0 % (ref 0.0–0.2)

## 2019-11-17 ENCOUNTER — Other Ambulatory Visit: Payer: Self-pay

## 2019-11-17 ENCOUNTER — Inpatient Hospital Stay (HOSPITAL_BASED_OUTPATIENT_CLINIC_OR_DEPARTMENT_OTHER): Payer: Medicare Other | Admitting: Internal Medicine

## 2019-11-17 ENCOUNTER — Other Ambulatory Visit: Payer: Self-pay | Admitting: Family Medicine

## 2019-11-17 ENCOUNTER — Encounter: Payer: Self-pay | Admitting: Internal Medicine

## 2019-11-17 VITALS — BP 148/87 | HR 99 | Temp 98.7°F | Resp 17 | Ht 66.0 in | Wt 147.5 lb

## 2019-11-17 DIAGNOSIS — C3491 Malignant neoplasm of unspecified part of right bronchus or lung: Secondary | ICD-10-CM

## 2019-11-17 DIAGNOSIS — F13230 Sedative, hypnotic or anxiolytic dependence with withdrawal, uncomplicated: Secondary | ICD-10-CM

## 2019-11-17 DIAGNOSIS — C3411 Malignant neoplasm of upper lobe, right bronchus or lung: Secondary | ICD-10-CM | POA: Diagnosis not present

## 2019-11-17 DIAGNOSIS — I10 Essential (primary) hypertension: Secondary | ICD-10-CM | POA: Diagnosis not present

## 2019-11-17 DIAGNOSIS — C3490 Malignant neoplasm of unspecified part of unspecified bronchus or lung: Secondary | ICD-10-CM

## 2019-11-17 DIAGNOSIS — F13930 Sedative, hypnotic or anxiolytic use, unspecified with withdrawal, uncomplicated: Secondary | ICD-10-CM

## 2019-11-17 NOTE — Progress Notes (Signed)
[facility] [phone]   [phone]  OFFICE PROGRESS NOTE  Bernerd Limbo, MD [address]  [place] [address]  DIAGNOSIS: Stage IA (T1a, N0, M0) non-small cell lung cancer, well-differentiated adenocarcinoma diagnosed in November 2018.  PRIOR THERAPY:  Status post wedge resection of the right upper lobe under the care of Dr. Roxan Hockey on September 28, 2017.  CURRENT THERAPY: Observation.  INTERVAL HISTORY: [name] 69 y.o. female returns to the clinic today for follow-up visit accompanied by her daughter.  The patient is feeling fine today with no concerning complaints except for shortness of breath with exertion.  The patient denied having any current chest pain, cough or hemoptysis.  She denied having any nausea, vomiting, diarrhea or constipation.  She has no headache or visual changes.  She has no recent weight loss or night sweats.  She had repeat CT scan of the chest performed recently and she is here for evaluation and discussion of her scan results.  MEDICAL HISTORY: Past Medical History:  Diagnosis Date  . Anemia   . Anxiety   . Arthritis    "everywhere" (08/19/2013)  . Bilateral carpal tunnel syndrome   . Chest pain at rest, rule out pericarditis 11/27/2012  . CHF (congestive heart failure) ([place])   . Chronic kidney disease    dr Risa Grill  . Chronic lower back pain   . Closed displaced fracture of right femoral neck ([place]) 02/18/2019  . Complication of anesthesia    "woke up twice during knee replacement" (08/19/2013)  . Depression   . Dyspnea    w/ exertion    . Fibromyalgia   . Headache(784.0)    hx of migraines   . History of pericarditis, with pericardial window in 2009 11/27/2012  . Hx of ovarian cancer 11/27/2012  . Hypothyroidism 11/27/2012  . Iron deficiency anemia   . Kidney carcinoma ([place])    "both kidneys; ~ 3 yr apart" (08/19/2013)  . Lung cancer ([place]) 2018  . Melanoma of back ([place])   .  Migraines   . Pneumonia    "several times; including today", RUL/notes 08/19/2013  . Squamous carcinoma    "face, side of my nose, chest; used acid to get rid of them" (08/19/2013)  . Venous insufficiency 11/27/2012    ALLERGIES:  is allergic to amoxicillin; ampicillin; and buspar [buspirone].  MEDICATIONS:  Current Outpatient Medications  Medication Sig Dispense Refill  . acetaminophen (TYLENOL) 500 MG tablet Take 2 tablets (1,000 mg total) by mouth every 8 (eight) hours. 30 tablet 0  . amLODipine (NORVASC) 10 MG tablet Take 10 mg by mouth daily.    Marland Kitchen buPROPion (WELLBUTRIN SR) 150 MG 12 hr tablet TAKE 1 TABLET BY MOUTH DAILY (Patient taking differently: Take 150 mg by mouth daily. ) 30 tablet 0  . busPIRone (BUSPAR) 15 MG tablet Take 15 mg by mouth daily.     . diphenoxylate-atropine (LOMOTIL) 2.5-0.025 MG tablet Take 2 tablets by mouth 4 (four) times daily as needed for diarrhea or loose stools.     . ferrous sulfate (FERROUSUL) 325 (65 FE) MG tablet Take 1 tablet (325 mg total) by mouth 3 (three) times daily with meals.  3  . levothyroxine (SYNTHROID, LEVOTHROID) 100 MCG tablet TAKE 1 TABLET BY MOUTH BEFORE BREAKFAST MONDAY-FRIDAY SKIP SATURDAY ANS SUNDAY (Patient taking differently: Take 100 mcg by mouth daily. ) 30 tablet 3  . morphine (MS CONTIN) 30 MG 12 hr tablet Take 1  tablet (30 mg total) by mouth every 12 (twelve) hours. 60 tablet 0  . ondansetron (ZOFRAN) 4 MG tablet Take 4 mg by mouth 3 (three) times daily as needed for nausea/vomiting.    Marland Kitchen oxyCODONE (OXY IR/ROXICODONE) 5 MG immediate release tablet Take 1-2 tablets (5-10 mg total) by mouth every 4 (four) hours as needed for moderate pain or severe pain. (Patient not taking: Reported on 05/13/2019) 60 tablet 0  . pantoprazole (PROTONIX) 40 MG tablet Take 40 mg by mouth daily.     . polyethylene glycol (MIRALAX / GLYCOLAX) 17 g packet Take 17 g by mouth 2 (two) times daily. 14 each 0  . vitamin B-12 (CYANOCOBALAMIN) 1000 MCG tablet  Take 1 tablet by mouth daily.    Marland Kitchen zolpidem (AMBIEN) 5 MG tablet Take 5 mg by mouth at bedtime.      No current facility-administered medications for this visit.    SURGICAL HISTORY:  Past Surgical History:  Procedure Laterality Date  . ABDOMINAL HYSTERECTOMY  1979  . ANTERIOR LAT LUMBAR FUSION Left 07/16/2018   Procedure: Left Lumbar two-three  Anterolateral decompression/interbody fusion with lateral plate fixation;  Surgeon: Kristeen Miss, MD;  Location: [facility];  Service: Neurosurgery;  Laterality: Left;  . APPENDECTOMY    . DOPPLER ECHOCARDIOGRAPHY  02/23/2011   LVEF>50%  . FRACTURE SURGERY    . KNEE ARTHROSCOPY Right    "twice before the replacement" (08/19/2013)  . LEFT OOPHORECTOMY Left 1979  . [place]   "ruptured disc"  . MELANOMA EXCISION  ~ 2010   "my lower back" (08/19/2013)  . ORIF HIP FRACTURE Left    x 2 age 11 and 4  . PARTIAL NEPHRECTOMY Right 2005; ~ 2008  . PERICARDIAL WINDOW  ~ 2012  . POSTERIOR LUMBAR FUSION  2002?; 03/2012  . REDUCTION MAMMAPLASTY Bilateral ~ 2008  . RIGHT OOPHORECTOMY  ~ 2007   "it had cancer in it" (08/19/2013)  . SHOULDER ARTHROSCOPY W/ ROTATOR CUFF REPAIR Right 1990's  . TONSILLECTOMY  1954  . TOTAL HIP ARTHROPLASTY Right 02/18/2019   Procedure: TOTAL HIP ARTHROPLASTY;  Surgeon: Marchia Bond, MD;  Location: [facility];  Service: Orthopedics;  Laterality: Right;  . TOTAL HIP REVISION Right 03/27/2019   Procedure: TOTAL HIP REVISION;  Surgeon: Paralee Cancel, MD;  Location: [facility];  Service: Orthopedics;  Laterality: Right;  . TOTAL KNEE ARTHROPLASTY Right 1990's  . TOTAL KNEE ARTHROPLASTY Left 08/02/2015   Procedure: LEFT TOTAL KNEE ARTHROPLASTY;  Surgeon: Paralee Cancel, MD;  Location: [facility];  Service: Orthopedics;  Laterality: Left;  . TOTAL SHOULDER REPLACEMENT Left 2006?  . TUBAL LIGATION  1952  . VIDEO ASSISTED THORACOSCOPY (VATS)/WEDGE RESECTION Right 09/28/2017   Procedure: RIGHT VIDEO ASSISTED THORACOSCOPY (VATS)/WEDGE  RESECTION, LYMPH NODE DISSECTION ;  Surgeon: Melrose Nakayama, MD;  Location: [facility];  Service: Thoracic;  Laterality: Right;    REVIEW OF SYSTEMS:  A comprehensive review of systems was negative except for: Respiratory: positive for dyspnea on exertion   PHYSICAL EXAMINATION: General appearance: alert, cooperative and no distress Head: Normocephalic, without obvious abnormality, atraumatic Neck: no adenopathy, no JVD, supple, symmetrical, trachea midline and thyroid not enlarged, symmetric, no tenderness/mass/nodules Lymph nodes: Cervical, supraclavicular, and axillary nodes normal. Resp: clear to auscultation bilaterally Back: symmetric, no curvature. ROM normal. No CVA tenderness. Cardio: regular rate and rhythm, S1, S2 normal, no murmur, click, rub or gallop GI: soft, non-tender; bowel sounds normal; no masses,  no organomegaly Extremities: extremities normal, atraumatic, no  cyanosis or edema  ECOG PERFORMANCE STATUS: 1 - Symptomatic but completely ambulatory  Blood pressure (!) 148/87, pulse 99, temperature 98.7 F (37.1 C), temperature source Temporal, resp. rate 17, height 5\' 6"  (1.676 m), weight 147 lb 8 oz (66.9 kg), SpO2 100 %.  LABORATORY DATA: Lab Results  Component Value Date   WBC 5.6 11/13/2019   HGB 10.8 (L) 11/13/2019   HCT 34.0 (L) 11/13/2019   MCV 88.5 11/13/2019   PLT 187 11/13/2019      Chemistry      Component Value Date/Time   NA 139 11/13/2019 1024   NA 144 03/13/2018 1427   K 4.3 11/13/2019 1024   CL 105 11/13/2019 1024   CO2 26 11/13/2019 1024   BUN 21 11/13/2019 1024   BUN 42 (H) 03/13/2018 1427   CREATININE 1.16 (H) 11/13/2019 1024   CREATININE 1.33 (H) 09/02/2015 1220      Component Value Date/Time   CALCIUM 9.7 11/13/2019 1024   ALKPHOS 88 11/13/2019 1024   AST 17 11/13/2019 1024   ALT 10 11/13/2019 1024   BILITOT 0.4 11/13/2019 1024       RADIOGRAPHIC STUDIES: CT Chest Wo Contrast  Result Date: 11/11/2019 CLINICAL DATA:   Followup non-small cell lung cancer. EXAM: CT CHEST WITHOUT CONTRAST TECHNIQUE: Multidetector CT imaging of the chest was performed following the standard protocol without IV contrast. COMPARISON:  05/09/2019 FINDINGS: Cardiovascular: The heart size appears within normal limits. No pericardial effusion. Aortic atherosclerosis. Lad, left circumflex coronary artery atherosclerotic calcifications identified. Mediastinum/Nodes: No enlarged mediastinal or hilar lymph nodes. No axillary or supraclavicular adenopathy. Lungs/Pleura: Postop change right upper lobe wedge resection. No complications identified. No pleural effusion, airspace consolidation or atelectasis. Previously noted area of ground-glass attenuation in the posterior right upper lobe has improved in the interval. 3 mm anterior right lower lobe lung nodule identified, image 93/7. Unchanged from previous exam. 5 mm posterolateral right lower lobe lung nodule is identified image 113/7. Also unchanged. 3 mm posterolateral left upper lobe lung nodule is unchanged. Lastly, there is a tiny ground-glass attenuating nodule within the posterior left upper lobe measuring 4 mm, image 36/7. This was likely present on the previous exam and appears stable. Upper Abdomen: No acute abnormality identified. Musculoskeletal: No chest wall mass or suspicious bone lesions identified. Previous left shoulder arthroplasty. L1 vertebroplasty noted. No acute or suspicious osseous abnormalities. IMPRESSION: 1. Interval improvement in ground-glass attenuation within the posterior right upper lobe. No specific findings identified to suggest recurrent tumor or metastatic disease within the chest. 2. Small nonspecific pulmonary nodules are unchanged from previous exam. Continued attention on follow-up imaging is recommended. 3. Multi vessel coronary artery atherosclerotic calcifications. Electronically Signed   By: Kerby Moors M.D.   On: 11/11/2019 14:47    ASSESSMENT AND PLAN: This  is a very pleasant 69 years old white female with history of a stage IA non-small cell lung cancer, adenocarcinoma status post wedge resection of the right upper lobe in December 2018.   The patient has been in observation since that time and she is feeling fine. She had repeat CT scan of the chest performed recently.  I personally and independently reviewed the scans and discussed the results with the patient and her daughter. Her scan showed no concerning findings for disease recurrence or metastasis. I recommended for her to continue on observation with repeat CT scan of the chest in 1 year. For hypertension, I strongly encouraged the patient to take her blood pressure medication as  planned and to discuss with her primary care physician for adjustment of her medication if needed. She was advised to call immediately if she has any concerning symptoms in the interval. The patient voices understanding of current disease status and treatment options and is in agreement with the current care plan. All questions were answered. The patient knows to call the clinic with any problems, questions or concerns. We can certainly see the patient much sooner if necessary.   Disclaimer: This note was dictated with voice recognition software. Similar sounding words can inadvertently be transcribed and may not be corrected upon review.

## 2019-11-18 NOTE — Telephone Encounter (Signed)
Pt will need ov or tele in order to get refill on requested med

## 2019-11-19 ENCOUNTER — Telehealth: Payer: Self-pay | Admitting: Internal Medicine

## 2019-11-19 NOTE — Telephone Encounter (Signed)
Scheduled per los. Called and left msg. Mailed printout  °

## 2019-11-27 NOTE — Telephone Encounter (Signed)
Not our patient any longer  Pt changed primary care

## 2019-12-15 ENCOUNTER — Other Ambulatory Visit: Payer: Self-pay | Admitting: Family Medicine

## 2020-01-05 ENCOUNTER — Encounter: Payer: Self-pay | Admitting: Cardiology

## 2020-01-12 MED ORDER — VITAMINS/MINERALS PO TABS
ORAL_TABLET | ORAL | Status: DC
Start: 2020-01-12 — End: 2020-01-12

## 2020-01-12 MED ORDER — FERROUS SULFATE 325 (65 FE) MG PO TABS
325.00 | ORAL_TABLET | ORAL | Status: DC
Start: 2020-01-12 — End: 2020-01-12

## 2020-01-12 MED ORDER — LEVOTHYROXINE SODIUM 25 MCG PO TABS
75.00 | ORAL_TABLET | ORAL | Status: DC
Start: 2020-01-11 — End: 2020-01-12

## 2020-01-12 MED ORDER — SODIUM CHLORIDE 0.9 % IV SOLN
10.00 | INTRAVENOUS | Status: DC
Start: ? — End: 2020-01-12

## 2020-01-12 MED ORDER — DICLOFENAC SODIUM 1 % EX GEL
2.00 | CUTANEOUS | Status: DC
Start: ? — End: 2020-01-12

## 2020-01-12 MED ORDER — BUPROPION HCL ER (SR) 150 MG PO TB12
150.00 | ORAL_TABLET | ORAL | Status: DC
Start: 2020-01-12 — End: 2020-01-12

## 2020-01-12 MED ORDER — GENERIC EXTERNAL MEDICATION
Status: DC
Start: ? — End: 2020-01-12

## 2020-01-12 MED ORDER — ENOXAPARIN SODIUM 30 MG/0.3ML ~~LOC~~ SOLN
30.00 | SUBCUTANEOUS | Status: DC
Start: 2020-01-12 — End: 2020-01-12

## 2020-01-12 MED ORDER — GENERIC EXTERNAL MEDICATION
500.00 | Status: DC
Start: 2020-01-12 — End: 2020-01-12

## 2020-01-12 MED ORDER — BUSPIRONE HCL 5 MG PO TABS
15.00 | ORAL_TABLET | ORAL | Status: DC
Start: ? — End: 2020-01-12

## 2020-01-12 MED ORDER — PSYLLIUM 58.12 % PO PACK
1.00 | PACK | ORAL | Status: DC
Start: ? — End: 2020-01-12

## 2020-01-12 MED ORDER — POTASSIUM CHLORIDE CRYS ER 20 MEQ PO TBCR
40.00 | EXTENDED_RELEASE_TABLET | ORAL | Status: DC
Start: 2020-01-12 — End: 2020-01-12

## 2020-01-12 MED ORDER — CLONIDINE HCL 0.1 MG PO TABS
0.10 | ORAL_TABLET | ORAL | Status: DC
Start: ? — End: 2020-01-12

## 2020-01-12 MED ORDER — ACETAMINOPHEN 500 MG PO TABS
1000.00 | ORAL_TABLET | ORAL | Status: DC
Start: ? — End: 2020-01-12

## 2020-01-12 MED ORDER — PANTOPRAZOLE SODIUM 40 MG PO TBEC
40.00 | DELAYED_RELEASE_TABLET | ORAL | Status: DC
Start: 2020-01-12 — End: 2020-01-12

## 2020-01-14 ENCOUNTER — Other Ambulatory Visit (HOSPITAL_COMMUNITY): Payer: Self-pay | Admitting: Urology

## 2020-01-14 ENCOUNTER — Other Ambulatory Visit: Payer: Self-pay | Admitting: Urology

## 2020-01-14 DIAGNOSIS — Z85528 Personal history of other malignant neoplasm of kidney: Secondary | ICD-10-CM

## 2020-01-29 ENCOUNTER — Ambulatory Visit (HOSPITAL_COMMUNITY)
Admission: RE | Admit: 2020-01-29 | Discharge: 2020-01-29 | Disposition: A | Discharge status: Home / Self Care | Payer: Medicare Other | Source: Ambulatory Visit | Attending: Urology | Admitting: Urology

## 2020-01-29 ENCOUNTER — Other Ambulatory Visit: Payer: Self-pay

## 2020-01-29 DIAGNOSIS — Z85528 Personal history of other malignant neoplasm of kidney: Secondary | ICD-10-CM | POA: Insufficient documentation

## 2020-01-29 MED ORDER — GADOBUTROL 1 MMOL/ML IV SOLN
7.0000 mL | Freq: Once | INTRAVENOUS | Status: AC | PRN
  Administered 2020-01-29: 10:00:00 7 mL via INTRAVENOUS

## 2020-02-10 IMAGING — CT CT CHEST W/ CM
2 of 4 series · 15 of 36 positions shown, 18 images · IV contrast (OMNIPAQUE)
Comparison: Chest CT 07/13/2017.  PET-CT 08/21/2017.

CLINICAL DATA: Lung cancer diagnosed in August 2017 with
subsequent thoracic surgery. Remote history of bilateral renal
cancer.

EXAM:
CT CHEST WITH CONTRAST
TECHNIQUE: Multidetector CT imaging of the chest was performed during
intravenous contrast administration.
CONTRAST:  60mL OMNIPAQUE IOHEXOL 300 MG/ML  SOLN

[Series 2: axial st · axial · 0.72mm/px · z∈[-180,+108]mm · 12 of 169 slices shown, 15 images]
[im 13/169  mediastinal]
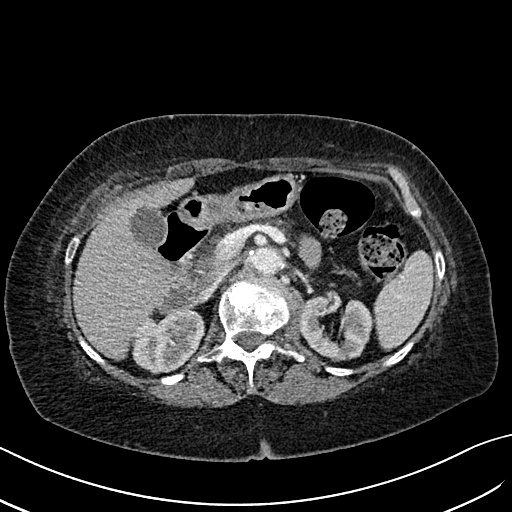
[im 13/169  lung]
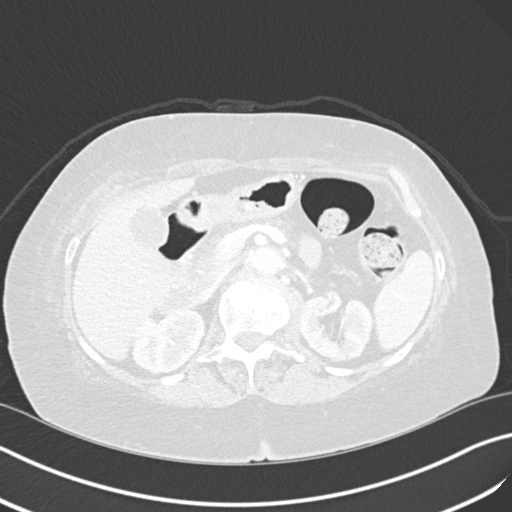
[im 25/169  lung]
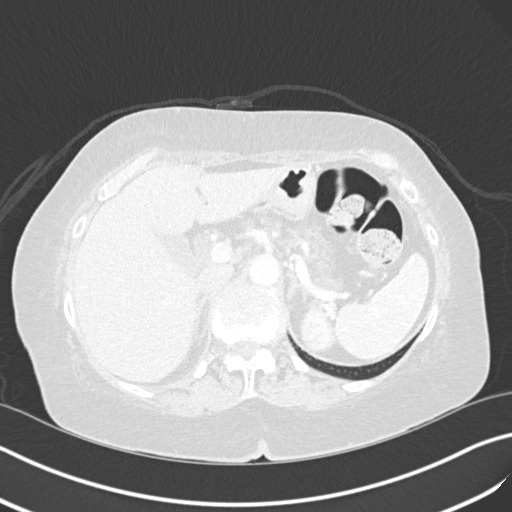
[im 37/169  lung]
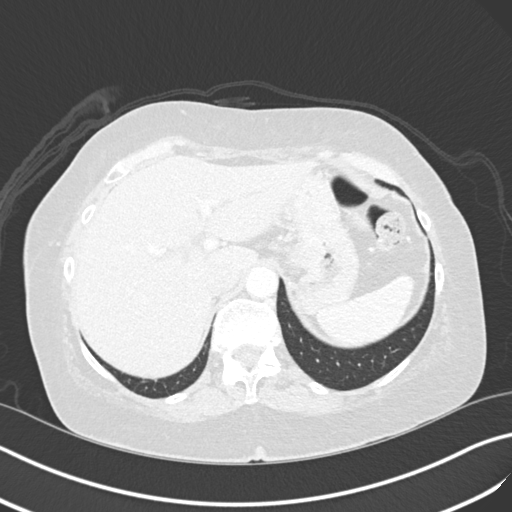
[im 49/169  lung]
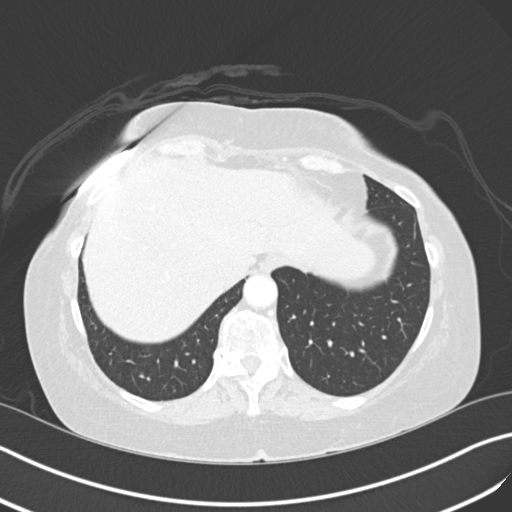
[im 61/169  mediastinal]
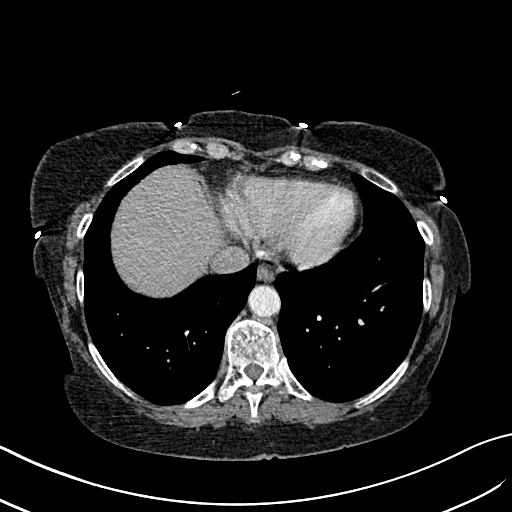
[im 61/169  lung]
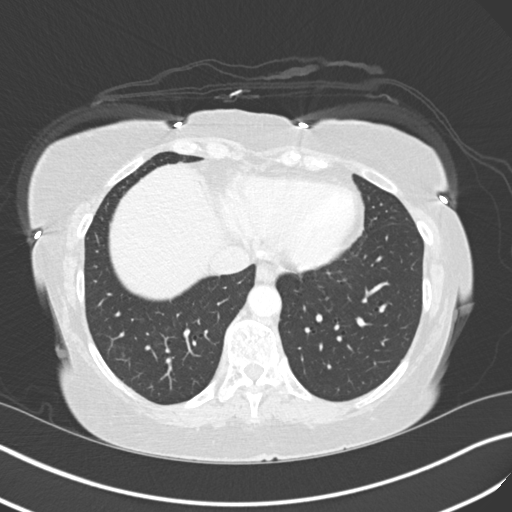
[im 73/169  lung]
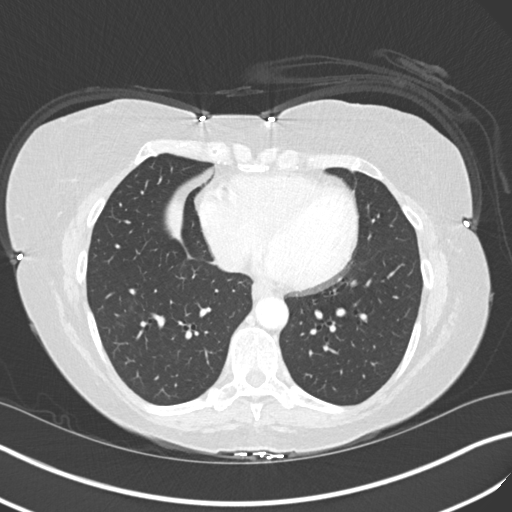
[im 97/169  lung]
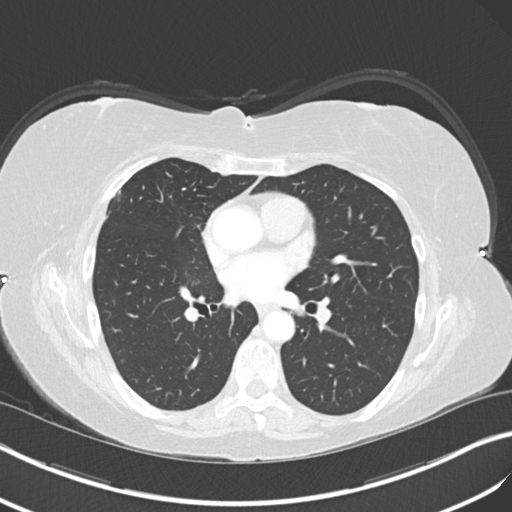
[im 109/169  lung]
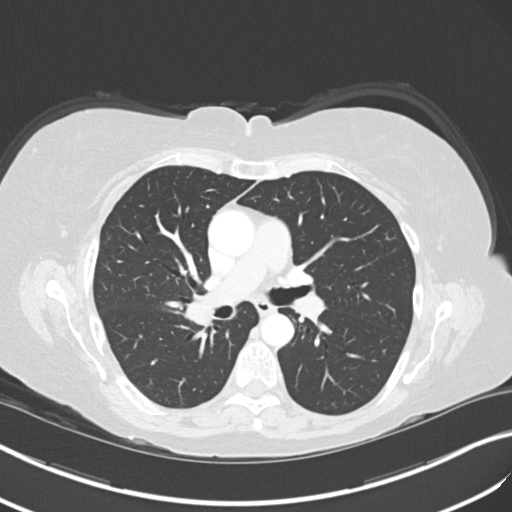
[im 121/169  mediastinal]
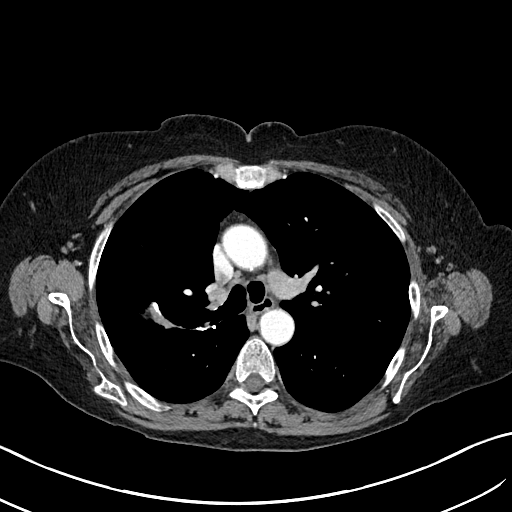
[im 121/169  lung]
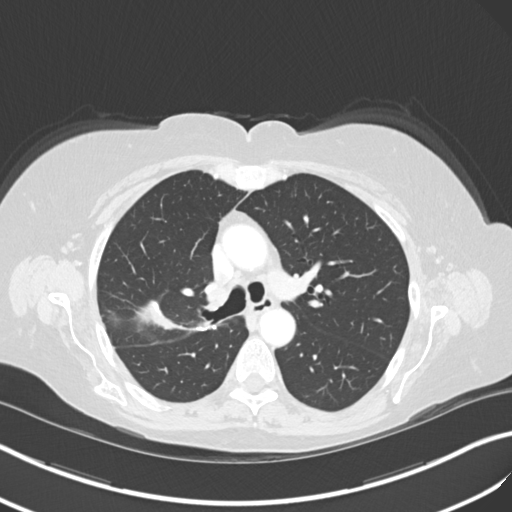
[im 133/169  lung]
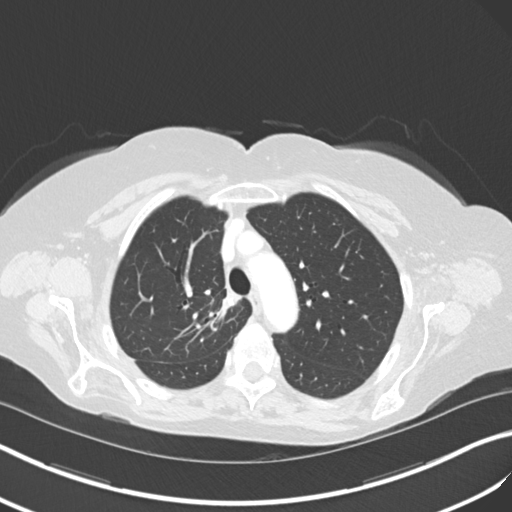
[im 145/169  lung]
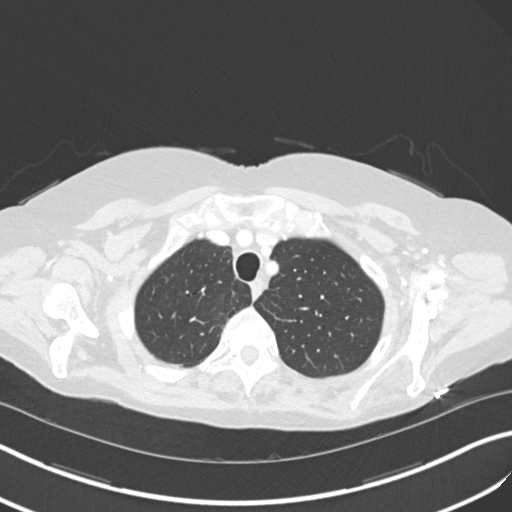
[im 157/169  lung]
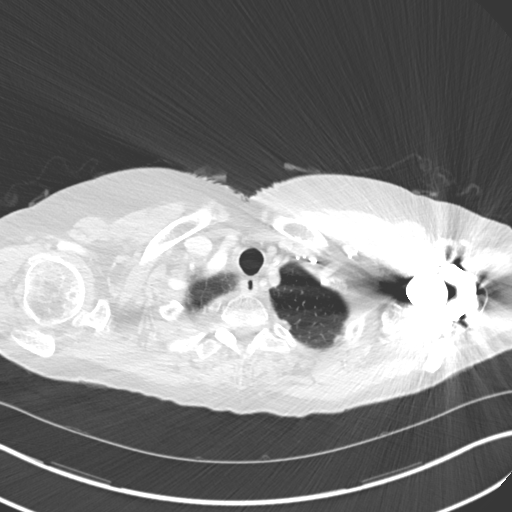

[Series 5: coronal · coronal · 0.73mm/px · 3 of 114 slices shown]
[im 23/114  lung]
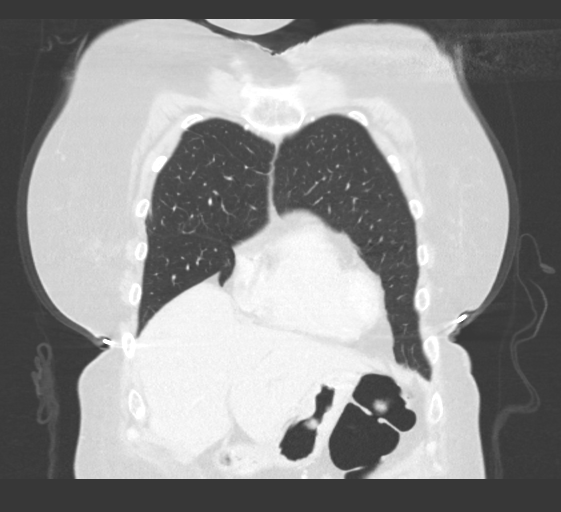
[im 46/114  lung]
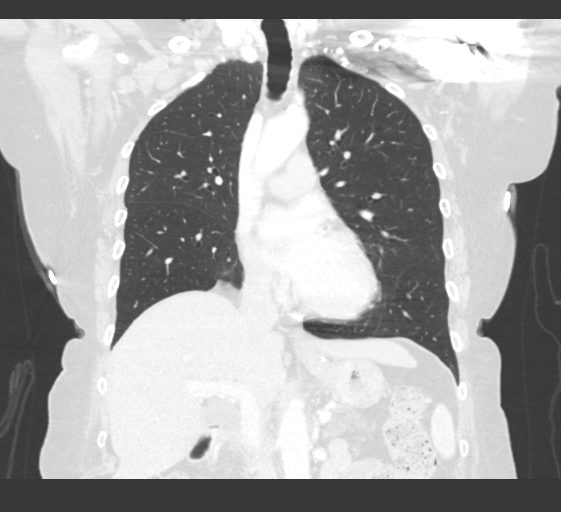
[im 68/114  lung]
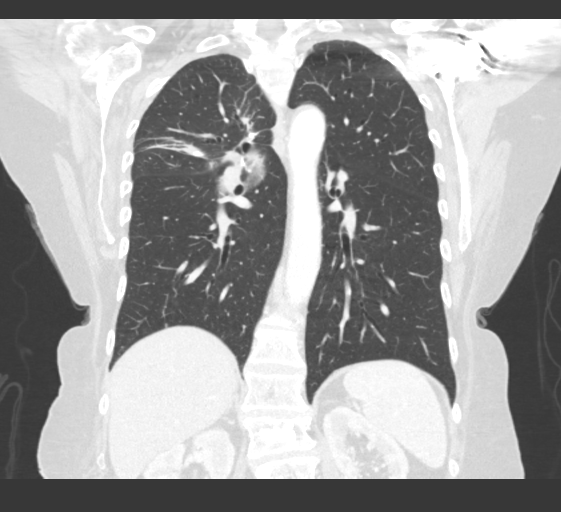

[15 of 36 positions shown; findings below may reference images not displayed]

FINDINGS: Cardiovascular: Atherosclerosis of the aorta, great vessels and
coronary arteries. No acute vascular findings are demonstrated. The
heart size is normal. There is no pericardial effusion.

Mediastinum/Nodes: There are no enlarged mediastinal, hilar or
axillary lymph nodes. The thyroid gland, trachea and esophagus
demonstrate no significant findings.

Lungs/Pleura: There is no pleural effusion. Interval wedge resection
of the part solid right upper lobe nodule. There is mild linear
scarring surrounding the sutures. No new or enlarging pulmonary
nodules.

Upper abdomen: There is extrahepatic biliary dilatation. The common
hepatic duct measures up to 15 mm in diameter on coronal image 40/5.
The gallbladder is present. There is diffuse fatty replacement of
the pancreas. Probable small bilateral renal cysts.

Musculoskeletal/Chest wall: There is no chest wall mass or
suspicious osseous finding. Left shoulder reverse arthroplasty and
previous spinal augmentation at L1 noted.
IMPRESSION: 1. Interval wedge resection of the part solid right upper lobe
lesion. No evidence of metastatic disease or new pulmonary nodule.
2. Extrahepatic biliary dilatation of uncertain etiology and
significance. Liver function studies performed today were normal.
3. Coronary and Aortic Atherosclerosis ([id number]).

## 2020-03-09 ENCOUNTER — Emergency Department (HOSPITAL_COMMUNITY)
Admission: EM | Admit: 2020-03-09 | Discharge: 2020-03-10 | Disposition: A | Discharge status: Home / Self Care | Payer: Medicare Other | Attending: Emergency Medicine | Admitting: Emergency Medicine

## 2020-03-09 ENCOUNTER — Emergency Department (HOSPITAL_COMMUNITY): Payer: Medicare Other

## 2020-03-09 ENCOUNTER — Other Ambulatory Visit: Payer: Self-pay

## 2020-03-09 ENCOUNTER — Encounter (HOSPITAL_COMMUNITY): Payer: Self-pay | Admitting: Emergency Medicine

## 2020-03-09 DIAGNOSIS — E039 Hypothyroidism, unspecified: Secondary | ICD-10-CM | POA: Insufficient documentation

## 2020-03-09 DIAGNOSIS — Y92019 Unspecified place in single-family (private) house as the place of occurrence of the external cause: Secondary | ICD-10-CM | POA: Diagnosis not present

## 2020-03-09 DIAGNOSIS — Z20822 Contact with and (suspected) exposure to covid-19: Secondary | ICD-10-CM | POA: Diagnosis not present

## 2020-03-09 DIAGNOSIS — N189 Chronic kidney disease, unspecified: Secondary | ICD-10-CM | POA: Insufficient documentation

## 2020-03-09 DIAGNOSIS — R634 Abnormal weight loss: Secondary | ICD-10-CM

## 2020-03-09 DIAGNOSIS — Y999 Unspecified external cause status: Secondary | ICD-10-CM | POA: Insufficient documentation

## 2020-03-09 DIAGNOSIS — Z8543 Personal history of malignant neoplasm of ovary: Secondary | ICD-10-CM | POA: Diagnosis not present

## 2020-03-09 DIAGNOSIS — S73034A Other anterior dislocation of right hip, initial encounter: Secondary | ICD-10-CM | POA: Diagnosis not present

## 2020-03-09 DIAGNOSIS — Z87891 Personal history of nicotine dependence: Secondary | ICD-10-CM | POA: Diagnosis not present

## 2020-03-09 DIAGNOSIS — X500XXA Overexertion from strenuous movement or load, initial encounter: Secondary | ICD-10-CM | POA: Diagnosis not present

## 2020-03-09 DIAGNOSIS — Z85118 Personal history of other malignant neoplasm of bronchus and lung: Secondary | ICD-10-CM | POA: Diagnosis not present

## 2020-03-09 DIAGNOSIS — S79911A Unspecified injury of right hip, initial encounter: Secondary | ICD-10-CM | POA: Diagnosis present

## 2020-03-09 DIAGNOSIS — Y939 Activity, unspecified: Secondary | ICD-10-CM | POA: Diagnosis not present

## 2020-03-09 DIAGNOSIS — Z79899 Other long term (current) drug therapy: Secondary | ICD-10-CM | POA: Insufficient documentation

## 2020-03-09 DIAGNOSIS — W19XXXA Unspecified fall, initial encounter: Secondary | ICD-10-CM

## 2020-03-09 DIAGNOSIS — I129 Hypertensive chronic kidney disease with stage 1 through stage 4 chronic kidney disease, or unspecified chronic kidney disease: Secondary | ICD-10-CM | POA: Diagnosis not present

## 2020-03-09 LAB — BASIC METABOLIC PANEL
Anion gap: 9 (ref 5–15)
BUN: 22 mg/dL (ref 8–23)
CO2: 26 mmol/L (ref 22–32)
Calcium: 9.8 mg/dL (ref 8.9–10.3)
Chloride: 104 mmol/L (ref 98–111)
Creatinine, Ser: 1.28 mg/dL — ABNORMAL HIGH (ref 0.44–1.00)
GFR calc Af Amer: 49 mL/min — ABNORMAL LOW (ref >60)
GFR calc non Af Amer: 42 mL/min — ABNORMAL LOW (ref >60)
Glucose, Bld: 96 mg/dL (ref 70–99)
Potassium: 4 mmol/L (ref 3.5–5.1)
Sodium: 139 mmol/L (ref 135–145)

## 2020-03-09 LAB — CBC WITH DIFFERENTIAL/PLATELET
Abs Immature Granulocytes: 0.02 10*3/uL (ref 0.00–0.07)
Basophils Absolute: 0 10*3/uL (ref 0.0–0.1)
Basophils Relative: 1 %
Eosinophils Absolute: 0.1 10*3/uL (ref 0.0–0.5)
Eosinophils Relative: 1 %
HCT: 34.7 % — ABNORMAL LOW (ref 36.0–46.0)
Hemoglobin: 11 g/dL — ABNORMAL LOW (ref 12.0–15.0)
Immature Granulocytes: 0 %
Lymphocytes Relative: 29 %
Lymphs Abs: 2 10*3/uL (ref 0.7–4.0)
MCH: 30.1 pg (ref 26.0–34.0)
MCHC: 31.7 g/dL (ref 30.0–36.0)
MCV: 94.8 fL (ref 80.0–100.0)
Monocytes Absolute: 0.5 10*3/uL (ref 0.1–1.0)
Monocytes Relative: 7 %
Neutro Abs: 4.2 10*3/uL (ref 1.7–7.7)
Neutrophils Relative %: 62 %
Platelets: 172 10*3/uL (ref 150–400)
RBC: 3.66 MIL/uL — ABNORMAL LOW (ref 3.87–5.11)
RDW: 13.3 % (ref 11.5–15.5)
WBC: 6.8 10*3/uL (ref 4.0–10.5)
nRBC: 0 % (ref 0.0–0.2)

## 2020-03-09 MED ORDER — HYDROMORPHONE HCL 1 MG/ML IJ SOLN
INTRAMUSCULAR | Status: AC
  Filled 2020-03-09: qty 1

## 2020-03-09 MED ORDER — PROPOFOL 10 MG/ML IV BOLUS
0.5000 mg/kg | Freq: Once | INTRAVENOUS | Status: AC
  Administered 2020-03-09: 32.7 mg via INTRAVENOUS

## 2020-03-09 MED ORDER — FENTANYL CITRATE (PF) 100 MCG/2ML IJ SOLN
INTRAMUSCULAR | Status: AC
  Administered 2020-03-09: 100 ug
  Filled 2020-03-09: qty 2

## 2020-03-09 MED ORDER — PROPOFOL 10 MG/ML IV BOLUS
1.0000 mg/kg | Freq: Once | INTRAVENOUS | Status: DC
  Filled 2020-03-09: qty 20

## 2020-03-09 MED ORDER — PROPOFOL 10 MG/ML IV BOLUS
INTRAVENOUS | Status: AC | PRN
  Administered 2020-03-09: 65 ug via INTRAVENOUS

## 2020-03-09 MED ORDER — HYDROMORPHONE HCL 1 MG/ML IJ SOLN
1.0000 mg | Freq: Once | INTRAMUSCULAR | Status: AC
  Administered 2020-03-09: 1 mg via INTRAVENOUS
  Filled 2020-03-09: qty 1

## 2020-03-09 MED ORDER — PROPOFOL 10 MG/ML IV BOLUS
INTRAVENOUS | Status: AC
  Filled 2020-03-09: qty 20

## 2020-03-09 NOTE — Consult Note (Signed)
ORTHOPAEDIC CONSULTATION  REQUESTING PHYSICIAN: Hayden Rasmussen, MD  PCP:  Bernerd Limbo, MD  Chief Complaint: Right prosthetic hip dislocation  HPI: [name] is a 69 y.o. female who complains of acute onset of right hip pain as well as inability to bear weight following a flexion activity earlier this evening.  She states she was bending over to place on a sock.  She has had 2 previous right hip surgeries.  The index procedure was for a hip fracture treated with a posterior approach total hip arthroplasty.  She had multiple instability episodes and then subsequently required revision by my partner Dr. Adriana Mccallum.  That was done in [place] 2020.  She has been found the emergency department have a recurrent dislocation of the right hip.  She has had 2 failed attempts at closed reduction by the emergency department provider.  She denies numbness or tingling.  She is independent with ADLs but she lives alone with her husband.  Past Medical History:  Diagnosis Date  . Anemia   . Anxiety   . Arthritis    "everywhere" (08/19/2013)  . Bilateral carpal tunnel syndrome   . Chest pain at rest, rule out pericarditis 11/27/2012  . CHF (congestive heart failure) ([place])   . Chronic kidney disease    dr Risa Grill  . Chronic lower back pain   . Closed displaced fracture of right femoral neck ([place]) 02/18/2019  . Complication of anesthesia    "woke up twice during knee replacement" (08/19/2013)  . Depression   . Dyspnea    w/ exertion    . Fibromyalgia   . Headache(784.0)    hx of migraines   . History of pericarditis, with pericardial window in 2009 11/27/2012  . Hx of ovarian cancer 11/27/2012  . Hypothyroidism 11/27/2012  . Iron deficiency anemia   . Kidney carcinoma ([place])    "both kidneys; ~ 3 yr apart" (08/19/2013)  . Lung cancer ([place]) 2018  . Melanoma of back ([place])   . Migraines   . Pneumonia    "several times; including today", RUL/notes 08/19/2013  . Squamous carcinoma    "face, side of  my nose, chest; used acid to get rid of them" (08/19/2013)  . Venous insufficiency 11/27/2012   Past Surgical History:  Procedure Laterality Date  . ABDOMINAL HYSTERECTOMY  1979  . ANTERIOR LAT LUMBAR FUSION Left 07/16/2018   Procedure: Left Lumbar two-three  Anterolateral decompression/interbody fusion with lateral plate fixation;  Surgeon: Kristeen Miss, MD;  Location: [facility];  Service: Neurosurgery;  Laterality: Left;  . APPENDECTOMY    . DOPPLER ECHOCARDIOGRAPHY  02/23/2011   LVEF>50%  . FRACTURE SURGERY    . KNEE ARTHROSCOPY Right    "twice before the replacement" (08/19/2013)  . LEFT OOPHORECTOMY Left 1979  . [place]   "ruptured disc"  . MELANOMA EXCISION  ~ 2010   "my lower back" (08/19/2013)  . ORIF HIP FRACTURE Left    x 2 age 62 and 4  . PARTIAL NEPHRECTOMY Right 2005; ~ 2008  . PERICARDIAL WINDOW  ~ 2012  . POSTERIOR LUMBAR FUSION  2002?; 03/2012  . REDUCTION MAMMAPLASTY Bilateral ~ 2008  . RIGHT OOPHORECTOMY  ~ 2007   "it had cancer in it" (08/19/2013)  . SHOULDER ARTHROSCOPY W/ ROTATOR CUFF REPAIR Right 1990's  . TONSILLECTOMY  1954  . TOTAL HIP ARTHROPLASTY Right 02/18/2019   Procedure: TOTAL HIP ARTHROPLASTY;  Surgeon: Marchia Bond, MD;  Location: [facility];  Service:  Orthopedics;  Laterality: Right;  . TOTAL HIP REVISION Right 03/27/2019   Procedure: TOTAL HIP REVISION;  Surgeon: Paralee Cancel, MD;  Location: [facility];  Service: Orthopedics;  Laterality: Right;  . TOTAL KNEE ARTHROPLASTY Right 1990's  . TOTAL KNEE ARTHROPLASTY Left 08/02/2015   Procedure: LEFT TOTAL KNEE ARTHROPLASTY;  Surgeon: Paralee Cancel, MD;  Location: [facility];  Service: Orthopedics;  Laterality: Left;  . TOTAL SHOULDER REPLACEMENT Left 2006?  . TUBAL LIGATION  1952  . VIDEO ASSISTED THORACOSCOPY (VATS)/WEDGE RESECTION Right 09/28/2017   Procedure: RIGHT VIDEO ASSISTED THORACOSCOPY (VATS)/WEDGE RESECTION, LYMPH NODE DISSECTION ;  Surgeon: Melrose Nakayama, MD;  Location: [facility];   Service: Thoracic;  Laterality: Right;   Social History   Socioeconomic History  . Marital status: Married    Spouse name: Not on file  . Number of children: Not on file  . Years of education: Not on file  . Highest education level: Not on file  Occupational History  . Not on file  Tobacco Use  . Smoking status: Former Smoker    Packs/day: 1.50    Years: 18.00    Pack years: 27.00    Types: Cigarettes    Quit date: 09/22/1985    Years since quitting: 34.4  . Smokeless tobacco: Never Used  Substance and Sexual Activity  . Alcohol use: No  . Drug use: No    Comment: prescribed  . Sexual activity: Not Currently  Other Topics Concern  . Not on file  Social History Narrative   Married.  Lives with husband.  Two children one boy and one girl.  Three granddaughters and two great granddaughters.    Social Determinants of Health   Financial Resource Strain:   . Difficulty of Paying Living Expenses:   Food Insecurity:   . Worried About Charity fundraiser in the Last Year:   . Arboriculturist in the Last Year:   Transportation Needs:   . Film/video editor (Medical):   Marland Kitchen Lack of Transportation (Non-Medical):   Physical Activity:   . Days of Exercise per Week:   . Minutes of Exercise per Session:   Stress:   . Feeling of Stress :   Social Connections:   . Frequency of Communication with Friends and Family:   . Frequency of Social Gatherings with Friends and Family:   . Attends Religious Services:   . Active Member of Clubs or Organizations:   . Attends Archivist Meetings:   Marland Kitchen Marital Status:    Family History  Problem Relation Age of Onset  . Cancer Mother 60       Stomach  . Cancer Father 1       Lung cancer  . Bipolar disorder Sister    Allergies  Allergen Reactions  . Amoxicillin Other (See Comments)    UNSPECIFIED REACTION  Has patient had a PCN reaction causing immediate rash, facial/tongue/throat swelling, SOB or lightheadedness with  hypotension: No Has patient had a PCN reaction causing severe rash involving mucus membranes or skin necrosis: No Has patient had a PCN reaction that required hospitalization No Has patient had a PCN reaction occurring within the last 10 years: No If all of the above answers are "NO", then may proceed with Cephalosporin use.  . Ampicillin Rash and Other (See Comments)    Has patient had a PCN reaction causing immediate rash, facial/tongue/throat swelling, SOB or lightheadedness with hypotension: No Has patient had a PCN reaction causing severe rash  involving mucus membranes or skin necrosis: No Has patient had a PCN reaction that required hospitalization No Has patient had a PCN reaction occurring within the last 10 years: No If all of the above answers are "NO", then may proceed with Cephalosporin use.  [place] [Buspirone] Nausea And Vomiting    Patient can tolerate   Prior to Admission medications   Medication Sig Start Date End Date Taking? Authorizing Provider  acetaminophen (TYLENOL) 500 MG tablet Take 2 tablets (1,000 mg total) by mouth every 8 (eight) hours. 03/28/19   Danae Orleans, PA-C  amLODipine (NORVASC) 10 MG tablet Take 10 mg by mouth daily.    [provider]  buPROPion (WELLBUTRIN SR) 150 MG 12 hr tablet TAKE 1 TABLET BY MOUTH DAILY Patient taking differently: Take 150 mg by mouth daily.  12/16/18   Forrest Moron, MD  busPIRone (BUSPAR) 15 MG tablet Take 15 mg by mouth daily.  12/17/18   [provider]  diphenoxylate-atropine (LOMOTIL) 2.5-0.025 MG tablet Take 2 tablets by mouth 4 (four) times daily as needed for diarrhea or loose stools.  12/19/18   [provider]  ferrous sulfate (FERROUSUL) 325 (65 FE) MG tablet Take 1 tablet (325 mg total) by mouth 3 (three) times daily with meals. 03/28/19   Danae Orleans, PA-C  levothyroxine (SYNTHROID, LEVOTHROID) 100 MCG tablet TAKE 1 TABLET BY MOUTH BEFORE BREAKFAST MONDAY-FRIDAY SKIP SATURDAY ANS  SUNDAY Patient taking differently: Take 100 mcg by mouth daily.  04/26/18   Forrest Moron, MD  morphine (MS CONTIN) 30 MG 12 hr tablet Take 1 tablet (30 mg total) by mouth every 12 (twelve) hours. 07/18/18   Kristeen Miss, MD  ondansetron (ZOFRAN) 4 MG tablet Take 4 mg by mouth 3 (three) times daily as needed for nausea/vomiting. 12/03/18   [provider]  oxyCODONE (OXY IR/ROXICODONE) 5 MG immediate release tablet Take 1-2 tablets (5-10 mg total) by mouth every 4 (four) hours as needed for moderate pain or severe pain. Patient not taking: Reported on 05/13/2019 03/28/19   Danae Orleans, PA-C  pantoprazole (PROTONIX) 40 MG tablet Take 40 mg by mouth daily.  01/24/19   [provider]  polyethylene glycol (MIRALAX / GLYCOLAX) 17 g packet Take 17 g by mouth 2 (two) times daily. 03/28/19   Danae Orleans, PA-C  vitamin B-12 (CYANOCOBALAMIN) 1000 MCG tablet Take 1 tablet by mouth daily.    [provider]  zolpidem (AMBIEN) 5 MG tablet Take 5 mg by mouth at bedtime.  12/02/18   [provider]   DG Hip [place] W or [place] Pelvis 1 View Right  Result Date: 03/09/2020 CLINICAL DATA:  Post reduction radiographs. EXAM: DG HIP (WITH OR WITHOUT PELVIS) 1V PORT RIGHT COMPARISON:  Mar 09, 2020 FINDINGS: There is a persistent right hip dislocation, not significantly changed from recent prior study. There is no definite acute displaced fracture. There is a stable hardware fracture of the patient's partially visualized lumbar fusion. IMPRESSION: Persistent dislocation of the right hip, not substantially changed from prior study. Electronically Signed   By: Constance Holster M.D.   On: 03/09/2020 22:57   DG HIP PORT UNILAT WITH PELVIS 1V RIGHT  Result Date: 03/09/2020 CLINICAL DATA:  Postreduction EXAM: DG HIP (WITH OR WITHOUT PELVIS) 1V PORT RIGHT COMPARISON:  03/09/2020 FINDINGS: Continued superior dislocation of the right hip replacement. No visible fracture. IMPRESSION: Continue  superior dislocation. Electronically Signed   By: Rolm Baptise M.D.   On: 03/09/2020 22:48  DG Hip Unilat With Pelvis 2-3 Views Right  Result Date: 03/09/2020 CLINICAL DATA:  Dislocated right hip, hip pain EXAM: DG HIP (WITH OR WITHOUT PELVIS) 2-3V RIGHT COMPARISON:  03/27/2019 FINDINGS: Changes of right hip replacement. Superior dislocation of the right hip replacement. No visible fracture. Postoperative changes in the lower lumbar spine IMPRESSION: Superior dislocation of the right hip replacement. Electronically Signed   By: Rolm Baptise M.D.   On: 03/09/2020 21:38    Positive ROS: All other systems have been reviewed and were otherwise negative with the exception of those mentioned in the HPI and as above.  Physical Exam: General: Alert, no acute distress Cardiovascular: No pedal edema Respiratory: No cyanosis, no use of accessory musculature GI: No organomegaly, abdomen is soft and non-tender Skin: No lesions in the area of chief complaint Neurologic: Sensation intact distally Psychiatric: Patient is competent for consent with normal mood and affect Lymphatic: No axillary or cervical lymphadenopathy  MUSCULOSKELETAL:  Right lower extremity is shortened and internally rotated compared to the left.  She has very nice 2+ dorsalis pedis pulse.  Motor is intact as well as sensation.  Assessment: Right prosthetic hip dislocation  Plan: -Plans to proceed with closed reduction in the emergency department via conscious sedation from the emergency department provider, Dr. Melina Copa.  This plan was discussed with the patient at length.  She has provided informed consent after consideration of all risk and benefits.  -Following successful reduction should be placed in a knee immobilizer and allowed to be weightbearing as tolerated in the knee immobilizer with strict posterior precautions.  She will follow up with Dr. Alvan Dame in 2 weeks.  -See procedure note for full detail.    Nicholes Stairs, MD Cell [phone]    03/09/2020 11:11 PM

## 2020-03-09 NOTE — ED Triage Notes (Signed)
Pt coming from home after sitting on couch and leaning over to put on sock, then heard right hip pop. Hx of x2 right side hip replacements. Received 100 mcg Fentanyl with EMS. Vitals WDL. Pt alert and oriented

## 2020-03-09 NOTE — Procedures (Signed)
Indications:  [name] is a very nice 69 year old female with an unfortunate recurrent right hip dislocation.  She is status post revision arthroplasty of her total hip for primary instability following total hip arthroplasty for fracture.  We discussed moving ahead with close reduction in the emergency department.  She did provide informed consent.  Assistants: None  Implants: None  Blood loss: None  Anesthesia: Conscious sedation provided by the emergency department provider.  Procedure:  After adequate timeout performed by the procedural team we did go ahead with reduction maneuver.  We applied hip flexion beyond 90 degrees with internal rotation and adduction.  And longitudinal traction was applied in a slow and steady manner.  We felt and heard a palpable reduction of the hip.  Leg lengths were then noted to be symmetric.  Rotation was also symmetric.  Nice palpable 2+ pulse was maintained.  The right leg was placed in a knee immobilizer to help prevent hip flexion.  Patient was awakened from conscious sedation with no noted complications.  Disposition:  [name] will be weightbearing as tolerated with a knee immobilizer on the right leg.  She will be strictly following posterior hip precautions which include no internal rotation of the right leg, no flexion of the hip beyond 90 degrees, and no cross body adduction.  She will follow up with Dr. Alvan Dame in 2 weeks.

## 2020-03-09 NOTE — ED Provider Notes (Signed)
[facility] EMERGENCY DEPARTMENT Provider Note   CSN: [id number] Arrival date & time: 03/09/20  2006     History No chief complaint on file. r hip pain  [name] is a 69 y.o. female.  She is here by EMS with complaint of acute right hip pain.  She said she was on the couch bending over to put on a sock when she had an acute pop in her hip.  Complaining of severe pain.  No prior history of dislocations.  There was no fall and no other complaints.  The history is provided by the patient and the EMS personnel.  Hip Pain This is a new problem. The current episode started less than 1 hour ago. The problem occurs constantly. The problem has not changed since onset.Pertinent negatives include no chest pain, no abdominal pain, no headaches and no shortness of breath. The symptoms are aggravated by bending and twisting. Nothing relieves the symptoms. She has tried nothing for the symptoms. The treatment provided no relief.       Past Medical History:  Diagnosis Date  . Anemia   . Anxiety   . Arthritis    "everywhere" (08/19/2013)  . Bilateral carpal tunnel syndrome   . Chest pain at rest, rule out pericarditis 11/27/2012  . CHF (congestive heart failure) ([place])   . Chronic kidney disease    dr Risa Grill  . Chronic lower back pain   . Closed displaced fracture of right femoral neck ([place]) 02/18/2019  . Complication of anesthesia    "woke up twice during knee replacement" (08/19/2013)  . Depression   . Dyspnea    w/ exertion    . Fibromyalgia   . Headache(784.0)    hx of migraines   . History of pericarditis, with pericardial window in 2009 11/27/2012  . Hx of ovarian cancer 11/27/2012  . Hypothyroidism 11/27/2012  . Iron deficiency anemia   . Kidney carcinoma ([place])    "both kidneys; ~ 3 yr apart" (08/19/2013)  . Lung cancer ([place]) 2018  . Melanoma of back ([place])   . Migraines   . Pneumonia    "several times; including today", RUL/notes 08/19/2013  . Squamous  carcinoma    "face, side of my nose, chest; used acid to get rid of them" (08/19/2013)  . Venous insufficiency 11/27/2012    Patient Active Problem List   Diagnosis Date Noted  . S/P right TH revision 03/27/2019  . 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 Closed displaced fracture of right femoral neck ([place]) 02/17/2019  . Leukocytosis 02/17/2019  . Normocytic normochromic anemia 02/17/2019  . Essential hypertension 02/17/2019  . Malnutrition of moderate degree 12/06/2018  . Pressure injury of skin 12/05/2018  . Hypomagnesemia 12/04/2018  . Dehydration 12/04/2018  . Weight loss 12/04/2018  . Colitis 12/04/2018  . 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 Benzodiazepine withdrawal without complication ([place]) 40/98/1191  . Hypokalemia 03/13/2018  . Acute on chronic renal insufficiency 03/13/2018  . Generalized anxiety disorder 03/13/2018  . Acute cognitive decline 03/13/2018  . Adenocarcinoma of right lung, stage 1 ([place]) 10/22/2017  . Chronic pain syndrome 07/25/2017  . S/P left TKA 08/02/2015  . S/P knee replacement 08/02/2015  . Carpal tunnel syndrome 04/23/2015  . Autoimmune disease ([place]) 03/29/2013  . Apnea, sleep 03/29/2013  . Anxiety 11/28/2012  . Seizure disorder ([place]) 11/28/2012  . Anxiety state 11/28/2012  . History of pericarditis, with pericardial window in 2009, and recurrent episodes 2011,2013,  treated with methotrexate 11/27/2012  . SOB (shortness of breath) 11/27/2012  . Hypothyroidism 11/27/2012  . History of partial nephrectomy, both rt. and lt 11/27/2012  . Hx of ovarian cancer 11/27/2012  . Venous insufficiency 11/27/2012  . Chronic back pain, hx of lumbar spondylosis and stenosis L5-S1 with hx decompression and total diskectomy 11/27/2012  . Hx of melanoma of skin 11/27/2012  . 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 Chronic venous insufficiency 11/27/2012  . DIARRHEA-PRESUMED INFECTIOUS 12/27/2009    Past  Surgical History:  Procedure Laterality Date  . ABDOMINAL HYSTERECTOMY  1979  . ANTERIOR LAT LUMBAR FUSION Left 07/16/2018   Procedure: Left Lumbar two-three  Anterolateral decompression/interbody fusion with lateral plate fixation;  Surgeon: Kristeen Miss, MD;  Location: [place];  Service: Neurosurgery;  Laterality: Left;  . APPENDECTOMY    . DOPPLER ECHOCARDIOGRAPHY  02/23/2011   LVEF>50%  . FRACTURE SURGERY    . KNEE ARTHROSCOPY Right    "twice before the replacement" (08/19/2013)  . LEFT OOPHORECTOMY Left 1979  . [place]   "ruptured disc"  . MELANOMA EXCISION  ~ 2010   "my lower back" (08/19/2013)  . ORIF HIP FRACTURE Left    x 2 age 68 and 4  . PARTIAL NEPHRECTOMY Right 2005; ~ 2008  . PERICARDIAL WINDOW  ~ 2012  . POSTERIOR LUMBAR FUSION  2002?; 03/2012  . REDUCTION MAMMAPLASTY Bilateral ~ 2008  . RIGHT OOPHORECTOMY  ~ 2007   "it had cancer in it" (08/19/2013)  . SHOULDER ARTHROSCOPY W/ ROTATOR CUFF REPAIR Right 1990's  . TONSILLECTOMY  1954  . TOTAL HIP ARTHROPLASTY Right 02/18/2019   Procedure: TOTAL HIP ARTHROPLASTY;  Surgeon: Marchia Bond, MD;  Location: [place];  Service: Orthopedics;  Laterality: Right;  . TOTAL HIP REVISION Right 03/27/2019   Procedure: TOTAL HIP REVISION;  Surgeon: Paralee Cancel, MD;  Location: [facility];  Service: Orthopedics;  Laterality: Right;  . TOTAL KNEE ARTHROPLASTY Right 1990's  . TOTAL KNEE ARTHROPLASTY Left 08/02/2015   Procedure: LEFT TOTAL KNEE ARTHROPLASTY;  Surgeon: Paralee Cancel, MD;  Location: [facility];  Service: Orthopedics;  Laterality: Left;  . TOTAL SHOULDER REPLACEMENT Left 2006?  . TUBAL LIGATION  1952  . VIDEO ASSISTED THORACOSCOPY (VATS)/WEDGE RESECTION Right 09/28/2017   Procedure: RIGHT VIDEO ASSISTED THORACOSCOPY (VATS)/WEDGE RESECTION, LYMPH NODE DISSECTION ;  Surgeon: Melrose Nakayama, MD;  Location: [place];  Service: Thoracic;  Laterality: Right;     OB History   No obstetric history on file.     Family  History  Problem Relation Age of Onset  . Cancer Mother 29       Stomach  . Cancer Father 45       Lung cancer  . Bipolar disorder Sister     Social History   Tobacco Use  . Smoking status: Former Smoker    Packs/day: 1.50    Years: 18.00    Pack years: 27.00    Types: Cigarettes    Quit date: 09/22/1985    Years since quitting: 34.4  . Smokeless tobacco: Never Used  Substance Use Topics  . Alcohol use: No  . Drug use: No    Comment: prescribed    Home Medications Prior to Admission medications   Medication Sig Start Date End Date Taking? Authorizing Provider  acetaminophen (TYLENOL) 500 MG tablet Take 2 tablets (1,000 mg total) by mouth every 8 (eight) hours. 03/28/19   Danae Orleans, PA-C  amLODipine (NORVASC) 10 MG  tablet Take 10 mg by mouth daily.    [provider]  buPROPion (WELLBUTRIN SR) 150 MG 12 hr tablet TAKE 1 TABLET BY MOUTH DAILY Patient taking differently: Take 150 mg by mouth daily.  12/16/18   Forrest Moron, MD  busPIRone (BUSPAR) 15 MG tablet Take 15 mg by mouth daily.  12/17/18   [provider]  diphenoxylate-atropine (LOMOTIL) 2.5-0.025 MG tablet Take 2 tablets by mouth 4 (four) times daily as needed for diarrhea or loose stools.  12/19/18   [provider]  ferrous sulfate (FERROUSUL) 325 (65 FE) MG tablet Take 1 tablet (325 mg total) by mouth 3 (three) times daily with meals. 03/28/19   Danae Orleans, PA-C  levothyroxine (SYNTHROID, LEVOTHROID) 100 MCG tablet TAKE 1 TABLET BY MOUTH BEFORE BREAKFAST MONDAY-FRIDAY SKIP SATURDAY ANS SUNDAY Patient taking differently: Take 100 mcg by mouth daily.  04/26/18   Forrest Moron, MD  morphine (MS CONTIN) 30 MG 12 hr tablet Take 1 tablet (30 mg total) by mouth every 12 (twelve) hours. 07/18/18   Kristeen Miss, MD  ondansetron (ZOFRAN) 4 MG tablet Take 4 mg by mouth 3 (three) times daily as needed for nausea/vomiting. 12/03/18   [provider]  oxyCODONE (OXY IR/ROXICODONE) 5 MG  immediate release tablet Take 1-2 tablets (5-10 mg total) by mouth every 4 (four) hours as needed for moderate pain or severe pain. Patient not taking: Reported on 05/13/2019 03/28/19   Danae Orleans, PA-C  pantoprazole (PROTONIX) 40 MG tablet Take 40 mg by mouth daily.  01/24/19   [provider]  polyethylene glycol (MIRALAX / GLYCOLAX) 17 g packet Take 17 g by mouth 2 (two) times daily. 03/28/19   Danae Orleans, PA-C  vitamin B-12 (CYANOCOBALAMIN) 1000 MCG tablet Take 1 tablet by mouth daily.    [provider]  zolpidem (AMBIEN) 5 MG tablet Take 5 mg by mouth at bedtime.  12/02/18   [provider]    Allergies    Amoxicillin, Ampicillin, and Buspar [buspirone]  Review of Systems   Review of Systems  Constitutional: Negative for fever.  HENT: Negative for sore throat.   Eyes: Negative for visual disturbance.  Respiratory: Negative for shortness of breath.   Cardiovascular: Negative for chest pain.  Gastrointestinal: Negative for abdominal pain.  Genitourinary: Negative for dysuria.  Musculoskeletal: Negative for back pain.  Skin: Negative for rash.  Neurological: Negative for headaches.    Physical Exam Updated Vital Signs BP (!) 149/69   Pulse 82   Temp (!) 97.4 F (36.3 C) (Oral)   Resp 20   Ht 5\' 5"  (1.651 m)   Wt 65.3 kg   SpO2 100%   BMI 23.96 kg/m   Physical Exam Vitals and nursing note reviewed.  Constitutional:      General: She is not in acute distress.    Appearance: She is well-developed.  HENT:     Head: Normocephalic and atraumatic.  Eyes:     Conjunctiva/sclera: Conjunctivae normal.  Cardiovascular:     Rate and Rhythm: Normal rate and regular rhythm.     Heart sounds: No murmur.  Pulmonary:     Effort: Pulmonary effort is normal. No respiratory distress.     Breath sounds: Normal breath sounds.  Abdominal:     Palpations: Abdomen is soft.     Tenderness: There is no abdominal tenderness.  Musculoskeletal:         General: Tenderness present.     Cervical back: Neck supple.  Right lower leg: No edema.     Left lower leg: No edema.     Comments: Is tenderness throughout the right hip and groin.  Her leg is flexed and internally rotated.  Skin:    General: Skin is warm and dry.     Capillary Refill: Capillary refill takes less than 2 seconds.  Neurological:     General: No focal deficit present.     Mental Status: She is alert.     Sensory: No sensory deficit.     Motor: No weakness.     ED Results / Procedures / Treatments   Labs (all labs ordered are listed, but only abnormal results are displayed) Labs Reviewed  BASIC METABOLIC PANEL - Abnormal; Notable for the following components:      Result Value   Creatinine, Ser 1.28 (*)    GFR calc non Af Amer 42 (*)    GFR calc Af Amer 49 (*)    All other components within normal limits  CBC WITH DIFFERENTIAL/PLATELET - Abnormal; Notable for the following components:   RBC 3.66 (*)    Hemoglobin 11.0 (*)    HCT 34.7 (*)    All other components within normal limits  SARS CORONAVIRUS 2 BY RT PCR (HOSPITAL ORDER, [place] [facility])    EKG None  Radiology DG HIP PORT UNILAT WITH PELVIS 1V RIGHT  Result Date: 03/09/2020 CLINICAL DATA:  Postreduction EXAM: DG HIP (WITH OR WITHOUT PELVIS) 1V PORT RIGHT COMPARISON:  03/09/2020 FINDINGS: Single AP view demonstrates normal AP alignment of the right hip replacement. No visible fracture. IMPRESSION: Interval reduction.  Normal AP alignment.  No visible fracture. Electronically Signed   By: Rolm Baptise M.D.   On: 03/09/2020 23:25   DG Hip [place] W or [place] Pelvis 1 View Right  Result Date: 03/09/2020 CLINICAL DATA:  Post reduction radiographs. EXAM: DG HIP (WITH OR WITHOUT PELVIS) 1V PORT RIGHT COMPARISON:  Mar 09, 2020 FINDINGS: There is a persistent right hip dislocation, not significantly changed from recent prior study. There is no definite acute displaced fracture. There  is a stable hardware fracture of the patient's partially visualized lumbar fusion. IMPRESSION: Persistent dislocation of the right hip, not substantially changed from prior study. Electronically Signed   By: Constance Holster M.D.   On: 03/09/2020 22:57   DG HIP PORT UNILAT WITH PELVIS 1V RIGHT  Result Date: 03/09/2020 CLINICAL DATA:  Postreduction EXAM: DG HIP (WITH OR WITHOUT PELVIS) 1V PORT RIGHT COMPARISON:  03/09/2020 FINDINGS: Continued superior dislocation of the right hip replacement. No visible fracture. IMPRESSION: Continue superior dislocation. Electronically Signed   By: Rolm Baptise M.D.   On: 03/09/2020 22:48   DG Hip Unilat With Pelvis 2-3 Views Right  Result Date: 03/09/2020 CLINICAL DATA:  Dislocated right hip, hip pain EXAM: DG HIP (WITH OR WITHOUT PELVIS) 2-3V RIGHT COMPARISON:  03/27/2019 FINDINGS: Changes of right hip replacement. Superior dislocation of the right hip replacement. No visible fracture. Postoperative changes in the lower lumbar spine IMPRESSION: Superior dislocation of the right hip replacement. Electronically Signed   By: Rolm Baptise M.D.   On: 03/09/2020 21:38    Procedures .Sedation  Date/Time: 03/10/2020 8:29 AM Performed by: Hayden Rasmussen, MD Authorized by: Hayden Rasmussen, MD   Consent:    Consent obtained:  Verbal and written   Consent given by:  Patient   Risks discussed:  Allergic reaction, dysrhythmia, inadequate sedation, nausea, prolonged hypoxia resulting in organ damage,  prolonged sedation necessitating reversal, respiratory compromise necessitating ventilatory assistance and intubation and vomiting   Alternatives discussed:  Analgesia without sedation, anxiolysis and regional anesthesia Universal protocol:    Procedure explained and questions answered to patient or proxy's satisfaction: yes     Relevant documents present and verified: yes     Test results available and properly labeled: yes     Imaging studies available: yes      Required blood products, implants, devices, and special equipment available: yes     Site/side marked: yes     Immediately prior to procedure a time out was called: yes     Patient identity confirmation method:  Verbally with patient Indications:    Procedure performed:  Dislocation reduction   Procedure necessitating sedation performed by:  Physician performing sedation Pre-sedation assessment:    Time since last food or drink:  8   ASA classification: class 2 - patient with mild systemic disease     Neck mobility: normal     Mouth opening:  3 or more finger widths   Thyromental distance:  4 finger widths   Mallampati score:  I - soft palate, uvula, fauces, pillars visible   Pre-sedation assessments completed and reviewed: airway patency, cardiovascular function, hydration status, mental status, nausea/vomiting, pain level, respiratory function and temperature     Pre-sedation assessment completed:  03/09/2020 10:00 PM Immediate pre-procedure details:    Reassessment: Patient reassessed immediately prior to procedure     Reviewed: vital signs, relevant labs/tests and NPO status     Verified: bag valve mask available, emergency equipment available, intubation equipment available, IV patency confirmed, oxygen available and suction available   Procedure details (see MAR for exact dosages):    Preoxygenation:  Nasal cannula   Sedation:  Propofol   Intended level of sedation: deep   Intra-procedure monitoring:  Blood pressure monitoring, cardiac monitor, continuous pulse oximetry, frequent LOC assessments, frequent vital sign checks and continuous capnometry   Intra-procedure events: none     Total Provider sedation time (minutes):  30 Post-procedure details:    Post-sedation assessment completed:  03/09/2020 10:45 AM   Attendance: Constant attendance by certified staff until patient recovered     Recovery: Patient returned to pre-procedure baseline     Post-sedation assessments completed  and reviewed: airway patency, cardiovascular function, hydration status, mental status, nausea/vomiting, pain level, respiratory function and temperature     Patient is stable for discharge or admission: yes     Patient tolerance:  Tolerated well, no immediate complications .Ortho Injury Treatment  Date/Time: 03/10/2020 8:30 AM Performed by: Hayden Rasmussen, MD Authorized by: Hayden Rasmussen, MD   Consent:    Consent obtained:  Written   Consent given by:  Patient   Risks discussed:  Fracture, nerve damage, irreducible dislocation and vascular damage   Alternatives discussed:  No treatment, immobilization, referral and delayed treatmentInjury location: hip Location details: right hip Injury type: dislocation Dislocation type: anterior Spontaneous dislocation: yes Prosthesis: yes Pre-procedure neurovascular assessment: neurovascularly intact Pre-procedure distal perfusion: normal Pre-procedure neurological function: normal Pre-procedure range of motion: reduced  Anesthesia: Local anesthesia used: no  Patient sedated: Yes. Refer to sedation procedure documentation for details of sedation. Manipulation performed: yes Reduction successful: no Immobilization: brace Post-procedure neurovascular assessment: post-procedure neurovascularly intact Post-procedure distal perfusion: normal Post-procedure neurological function: normal Post-procedure range of motion: unchanged Patient tolerance: patient tolerated the procedure well with no immediate complications  .Sedation  Date/Time: 03/10/2020 8:32 AM Performed by: Hayden Rasmussen, MD  Authorized by: Hayden Rasmussen, MD   Consent:    Consent obtained:  Verbal and written   Consent given by:  Patient   Risks discussed:  Allergic reaction, dysrhythmia, inadequate sedation, nausea, prolonged hypoxia resulting in organ damage, prolonged sedation necessitating reversal, respiratory compromise necessitating ventilatory assistance and  intubation and vomiting   Alternatives discussed:  Analgesia without sedation, anxiolysis and regional anesthesia Universal protocol:    Procedure explained and questions answered to patient or proxy's satisfaction: yes     Relevant documents present and verified: yes     Test results available and properly labeled: yes     Imaging studies available: yes     Required blood products, implants, devices, and special equipment available: yes     Site/side marked: yes     Immediately prior to procedure a time out was called: yes     Patient identity confirmation method:  Verbally with patient Indications:    Procedure necessitating sedation performed by:  Physician performing sedation Pre-sedation assessment:    Time since last food or drink:  8   ASA classification: class 2 - patient with mild systemic disease     Neck mobility: normal     Mouth opening:  3 or more finger widths   Thyromental distance:  4 finger widths   Mallampati score:  I - soft palate, uvula, fauces, pillars visible   Pre-sedation assessments completed and reviewed: airway patency, cardiovascular function, hydration status, mental status, nausea/vomiting, pain level, respiratory function and temperature   Immediate pre-procedure details:    Reassessment: Patient reassessed immediately prior to procedure     Reviewed: vital signs, relevant labs/tests and NPO status     Verified: bag valve mask available, emergency equipment available, intubation equipment available, IV patency confirmed, oxygen available and suction available   Procedure details (see MAR for exact dosages):    Preoxygenation:  Nasal cannula   Sedation:  Propofol   Intended level of sedation: deep   Intra-procedure monitoring:  Blood pressure monitoring, cardiac monitor, continuous pulse oximetry, frequent LOC assessments, frequent vital sign checks and continuous capnometry   Intra-procedure events: none     Total Provider sedation time (minutes):   10 Post-procedure details:    Post-sedation assessment completed:  03/09/2020 11:45 PM   Attendance: Constant attendance by certified staff until patient recovered     Recovery: Patient returned to pre-procedure baseline     Post-sedation assessments completed and reviewed: airway patency, cardiovascular function, hydration status, mental status, nausea/vomiting, pain level, respiratory function and temperature     Patient is stable for discharge or admission: yes     Patient tolerance:  Tolerated well, no immediate complications   (including critical care time)  Medications Ordered in ED Medications  HYDROmorphone (DILAUDID) injection 1 mg (1 mg Intravenous Given 03/09/20 2027)  propofol (DIPRIVAN) 10 mg/mL bolus/IV push 32.7 mg (32.7 mg Intravenous Given 03/09/20 2159)  fentaNYL (SUBLIMAZE) 100 MCG/2ML injection (100 mcg  Given 03/09/20 2305)  propofol (DIPRIVAN) 10 mg/mL bolus/IV push (65 mcg Intravenous Given 03/09/20 2304)  propofol (DIPRIVAN) 10 mg/mL bolus/IV push (20 mg Intravenous Given 03/09/20 2227)  HYDROmorphone (DILAUDID) injection (0.5 mg Intravenous Given 03/09/20 2225)    ED Course  I have reviewed the triage vital signs and the nursing notes.  Pertinent labs & imaging results that were available during my care of the patient were reviewed by me and considered in my medical decision making (see chart for details).  Clinical Course as of Mar 11 827  Tue Mar 09, 2020  2140 I interpret the patient's hip x-ray as dislocation of her hip prosthesis anteriorly.  I consented the patient verbally and will do a written consent for conscious sedation and closed reduction of dislocation.   [MB]  2233 2 attempts at closed reduction under sedation unsuccessful.  Discussed with Dr. Stann Mainland orthopedics who will review her films and op note and get back to me with recommendations.   [MB]  2312 Successful closed reduction of right hip dislocation with assistance of orthopedics Dr. Stann Mainland    [MB]    Clinical Course User Index [MB] Hayden Rasmussen, MD   MDM Rules/Calculators/A&P                     Differential diagnosis includes dislocation, fracture, contusion.  X-ray interpreted by me as superior anterior prosthetic hip dislocation.  After informed consent patient had conscious sedation and unfortunately to attempts at closed reduction without improvement.  Difficult to get patient fully sedated and relaxed.  Discussed with Dr. Stann Mainland orthopedics.  He presented to the emergency department asking for further attempts with another sedation.  Patient was successfully sedated and closed reduced.  Follow-up x-ray showed good reduction.  Patient placed in a knee immobilizer.  Will be discharged with posterior hip precautions and follow-up with Dr. Alvan Dame outpatient.  Return instructions discussed. Final Clinical Impression(s) / ED Diagnoses Final diagnoses:  Anterior dislocation of right hip, initial encounter [facility])    Rx / DC Orders ED Discharge Orders    None       Hayden Rasmussen, MD 03/10/20 [phone]

## 2020-03-09 NOTE — Discharge Instructions (Addendum)
You were seen in the emergency department for right hip dislocation.  After multiple attempts your right hip was relocated.  You should keep the knee brace on at all times and follow-up with Dr. Alvan Dame in 2 weeks.  Return to the emergency department if any worsening or concerning symptoms.

## 2020-03-10 LAB — SARS CORONAVIRUS 2 BY RT PCR (HOSPITAL ORDER, PERFORMED IN ~~LOC~~ HOSPITAL LAB): SARS Coronavirus 2: NEGATIVE

## 2020-03-10 MED ORDER — PROPOFOL 10 MG/ML IV BOLUS
INTRAVENOUS | Status: AC | PRN
  Administered 2020-03-09: 30 mg via INTRAVENOUS
  Administered 2020-03-09 (×4): 20 mg via INTRAVENOUS
  Administered 2020-03-09: 40 mg via INTRAVENOUS

## 2020-03-10 MED ORDER — HYDROMORPHONE HCL 1 MG/ML IJ SOLN
INTRAMUSCULAR | Status: AC | PRN
  Administered 2020-03-09: 0.5 mg via INTRAVENOUS

## 2020-03-10 NOTE — ED Notes (Addendum)
First sedation started @ 2157

## 2020-03-10 NOTE — ED Notes (Signed)
Wasted 0.5mg  Dilaudid with Anderson Malta, RN in [facility]

## 2020-03-10 NOTE — ED Notes (Signed)
First sedation ended @ 2230

## 2020-03-10 NOTE — ED Notes (Signed)
First attempt @ reducing hip did not work, will attempt second time and administer more Propofol per MD

## 2020-03-10 NOTE — ED Notes (Addendum)
Next patient in room hooked up to monitor and pt vitals copied over incorrectly into this chart @ 0028. Vitals from 0028 and on are vital signs from pt after this patient was discharged. [place] discharged @ [phone]

## 2020-03-10 NOTE — ED Notes (Signed)
Patient verbalizes understanding of discharge instructions. Opportunity for questioning and answers were provided. Armband removed by staff, pt discharged from ED by wheelchair with daughter to pick up and transport home. Pt alert and oriented and has knee immobilizer placed

## 2020-03-10 NOTE — ED Notes (Signed)
2 paper consents forms signed by patient for procedure by MD Melina Copa and MD Waneta Martins

## 2020-04-22 ENCOUNTER — Emergency Department (HOSPITAL_COMMUNITY): Payer: Medicare Other

## 2020-04-22 ENCOUNTER — Emergency Department (HOSPITAL_COMMUNITY)
Admission: EM | Admit: 2020-04-22 | Discharge: 2020-04-22 | Disposition: A | Discharge status: Home / Self Care | Payer: Medicare Other | Attending: Emergency Medicine | Admitting: Emergency Medicine

## 2020-04-22 ENCOUNTER — Encounter (HOSPITAL_COMMUNITY): Payer: Self-pay

## 2020-04-22 DIAGNOSIS — I509 Heart failure, unspecified: Secondary | ICD-10-CM | POA: Insufficient documentation

## 2020-04-22 DIAGNOSIS — Z87891 Personal history of nicotine dependence: Secondary | ICD-10-CM | POA: Diagnosis not present

## 2020-04-22 DIAGNOSIS — I130 Hypertensive heart and chronic kidney disease with heart failure and stage 1 through stage 4 chronic kidney disease, or unspecified chronic kidney disease: Secondary | ICD-10-CM | POA: Insufficient documentation

## 2020-04-22 DIAGNOSIS — S73004A Unspecified dislocation of right hip, initial encounter: Secondary | ICD-10-CM

## 2020-04-22 DIAGNOSIS — N189 Chronic kidney disease, unspecified: Secondary | ICD-10-CM | POA: Insufficient documentation

## 2020-04-22 DIAGNOSIS — Y829 Unspecified medical devices associated with adverse incidents: Secondary | ICD-10-CM | POA: Insufficient documentation

## 2020-04-22 DIAGNOSIS — T84020A Dislocation of internal right hip prosthesis, initial encounter: Secondary | ICD-10-CM | POA: Diagnosis not present

## 2020-04-22 DIAGNOSIS — Z79899 Other long term (current) drug therapy: Secondary | ICD-10-CM | POA: Insufficient documentation

## 2020-04-22 MED ORDER — HYDROMORPHONE HCL 1 MG/ML IJ SOLN
1.0000 mg | Freq: Once | INTRAMUSCULAR | Status: AC
  Administered 2020-04-22: 1 mg via INTRAVENOUS
  Filled 2020-04-22: qty 1

## 2020-04-22 MED ORDER — PROPOFOL 10 MG/ML IV BOLUS
INTRAVENOUS | Status: AC | PRN
  Administered 2020-04-22: 30 mg via INTRAVENOUS
  Administered 2020-04-22: 20 mg via INTRAVENOUS
  Administered 2020-04-22: 60 mg via INTRAVENOUS

## 2020-04-22 MED ORDER — SODIUM CHLORIDE 0.9 % IV SOLN: Freq: Once | INTRAVENOUS | Status: AC

## 2020-04-22 MED ORDER — PROPOFOL 10 MG/ML IV BOLUS
50.0000 mg | Freq: Once | INTRAVENOUS | Status: AC
  Administered 2020-04-22: 50 mg via INTRAVENOUS
  Filled 2020-04-22: qty 20

## 2020-04-22 MED ORDER — FENTANYL CITRATE (PF) 100 MCG/2ML IJ SOLN
100.0000 ug | Freq: Once | INTRAMUSCULAR | Status: AC
  Administered 2020-04-22: 100 ug via INTRAVENOUS
  Filled 2020-04-22: qty 2

## 2020-04-22 MED ORDER — PROPOFOL 10 MG/ML IV BOLUS
INTRAVENOUS | Status: AC | PRN
  Administered 2020-04-22 (×2): 20 mg via INTRAVENOUS
  Administered 2020-04-22: 30 mg via INTRAVENOUS
  Administered 2020-04-22 (×2): 20 mg via INTRAVENOUS
  Administered 2020-04-22: 30 mg via INTRAVENOUS
  Administered 2020-04-22: 50 mg via INTRAVENOUS

## 2020-04-22 MED ORDER — FENTANYL CITRATE (PF) 100 MCG/2ML IJ SOLN
50.0000 ug | INTRAMUSCULAR | Status: DC | PRN
  Administered 2020-04-22: 50 ug via INTRAVENOUS
  Filled 2020-04-22 (×2): qty 2

## 2020-04-22 MED ORDER — PROPOFOL 10 MG/ML IV BOLUS
INTRAVENOUS | Status: AC | PRN
  Administered 2020-04-22: 50 mg via INTRAVENOUS
  Administered 2020-04-22: 30 mg via INTRAVENOUS

## 2020-04-22 NOTE — ED Provider Notes (Signed)
[facility] DEPT Provider Note   CSN: [id number] Arrival date & time: 04/22/20  1035     History Chief Complaint  Patient presents with  . Hip Injury    [name] is a 69 y.o. female.  HPI   69 year old female with a history of anemia, arthritis, CHF, CAD, right femoral neck fracture s/p total hip replacement, depression, fibromyalgia, kidney carcinoma, lung cancer, who presents to the emergency department today for evaluation of right hip pain. States she was putting on her socks and her hip became dislocated. Has history of similar situation happening 1 month ago. Had sudden onset of severe pain. She reports some decreased sensation to the right lower extremity. Denies any other injuries. Denies any falls.  Past Medical History:  Diagnosis Date  . Anemia   . Anxiety   . Arthritis    "everywhere" (08/19/2013)  . Bilateral carpal tunnel syndrome   . Chest pain at rest, rule out pericarditis 11/27/2012  . CHF (congestive heart failure) ([place])   . Chronic kidney disease    dr Risa Grill  . Chronic lower back pain   . Closed displaced fracture of right femoral neck ([place]) 02/18/2019  . Complication of anesthesia    "woke up twice during knee replacement" (08/19/2013)  . Depression   . Dyspnea    w/ exertion    . Fibromyalgia   . Headache(784.0)    hx of migraines   . History of pericarditis, with pericardial window in 2009 11/27/2012  . Hx of ovarian cancer 11/27/2012  . Hypothyroidism 11/27/2012  . Iron deficiency anemia   . Kidney carcinoma ([place])    "both kidneys; ~ 3 yr apart" (08/19/2013)  . Lung cancer ([place]) 2018  . Melanoma of back ([place])   . Migraines   . Pneumonia    "several times; including today", RUL/notes 08/19/2013  . Squamous carcinoma    "face, side of my nose, chest; used acid to get rid of them" (08/19/2013)  . Venous insufficiency 11/27/2012    Patient Active Problem List   Diagnosis Date Noted  . S/P right TH revision  03/27/2019  . 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 Closed displaced fracture of right femoral neck ([place]) 02/17/2019  . Leukocytosis 02/17/2019  . Normocytic normochromic anemia 02/17/2019  . Essential hypertension 02/17/2019  . Malnutrition of moderate degree 12/06/2018  . Pressure injury of skin 12/05/2018  . Hypomagnesemia 12/04/2018  . Dehydration 12/04/2018  . Weight loss 12/04/2018  . Colitis 12/04/2018  . 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 Benzodiazepine withdrawal without complication ([place]) 93/23/5573  . Hypokalemia 03/13/2018  . Acute on chronic renal insufficiency 03/13/2018  . Generalized anxiety disorder 03/13/2018  . Acute cognitive decline 03/13/2018  . Adenocarcinoma of right lung, stage 1 ([place]) 10/22/2017  . Chronic pain syndrome 07/25/2017  . S/P left TKA 08/02/2015  . S/P knee replacement 08/02/2015  . Carpal tunnel syndrome 04/23/2015  . Autoimmune disease ([place]) 03/29/2013  . Apnea, sleep 03/29/2013  . Anxiety 11/28/2012  . Seizure disorder ([place]) 11/28/2012  . Anxiety state 11/28/2012  . History of pericarditis, with pericardial window in 2009, and recurrent episodes 2011,2013, treated with methotrexate 11/27/2012  . SOB (shortness of breath) 11/27/2012  . Hypothyroidism 11/27/2012  . History of partial nephrectomy, both rt. and lt 11/27/2012  . Hx of ovarian cancer 11/27/2012  . Venous insufficiency 11/27/2012  . Chronic back pain, hx of lumbar spondylosis and stenosis L5-S1 with hx decompression  and total diskectomy 11/27/2012  . Hx of melanoma of skin 11/27/2012  . 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 Chronic venous insufficiency 11/27/2012  . DIARRHEA-PRESUMED INFECTIOUS 12/27/2009    Past Surgical History:  Procedure Laterality Date  . ABDOMINAL HYSTERECTOMY  1979  . ANTERIOR LAT LUMBAR FUSION Left 07/16/2018   Procedure: Left Lumbar two-three  Anterolateral decompression/interbody  fusion with lateral plate fixation;  Surgeon: Kristeen Miss, MD;  Location: [facility];  Service: Neurosurgery;  Laterality: Left;  . APPENDECTOMY    . DOPPLER ECHOCARDIOGRAPHY  02/23/2011   LVEF>50%  . FRACTURE SURGERY    . KNEE ARTHROSCOPY Right    "twice before the replacement" (08/19/2013)  . LEFT OOPHORECTOMY Left 1979  . [place]   "ruptured disc"  . MELANOMA EXCISION  ~ 2010   "my lower back" (08/19/2013)  . ORIF HIP FRACTURE Left    x 2 age 4 and 4  . PARTIAL NEPHRECTOMY Right 2005; ~ 2008  . PERICARDIAL WINDOW  ~ 2012  . POSTERIOR LUMBAR FUSION  2002?; 03/2012  . REDUCTION MAMMAPLASTY Bilateral ~ 2008  . RIGHT OOPHORECTOMY  ~ 2007   "it had cancer in it" (08/19/2013)  . SHOULDER ARTHROSCOPY W/ ROTATOR CUFF REPAIR Right 1990's  . TONSILLECTOMY  1954  . TOTAL HIP ARTHROPLASTY Right 02/18/2019   Procedure: TOTAL HIP ARTHROPLASTY;  Surgeon: Marchia Bond, MD;  Location: [facility];  Service: Orthopedics;  Laterality: Right;  . TOTAL HIP REVISION Right 03/27/2019   Procedure: TOTAL HIP REVISION;  Surgeon: Paralee Cancel, MD;  Location: [facility];  Service: Orthopedics;  Laterality: Right;  . TOTAL KNEE ARTHROPLASTY Right 1990's  . TOTAL KNEE ARTHROPLASTY Left 08/02/2015   Procedure: LEFT TOTAL KNEE ARTHROPLASTY;  Surgeon: Paralee Cancel, MD;  Location: [facility];  Service: Orthopedics;  Laterality: Left;  . TOTAL SHOULDER REPLACEMENT Left 2006?  . TUBAL LIGATION  1952  . VIDEO ASSISTED THORACOSCOPY (VATS)/WEDGE RESECTION Right 09/28/2017   Procedure: RIGHT VIDEO ASSISTED THORACOSCOPY (VATS)/WEDGE RESECTION, LYMPH NODE DISSECTION ;  Surgeon: Melrose Nakayama, MD;  Location: [facility];  Service: Thoracic;  Laterality: Right;     OB History   No obstetric history on file.     Family History  Problem Relation Age of Onset  . Cancer Mother 19       Stomach  . Cancer Father 57       Lung cancer  . Bipolar disorder Sister     Social History   Tobacco Use  . Smoking status:  Former Smoker    Packs/day: 1.50    Years: 18.00    Pack years: 27.00    Types: Cigarettes    Quit date: 09/22/1985    Years since quitting: 34.6  . Smokeless tobacco: Never Used  Vaping Use  . Vaping Use: Never used  Substance Use Topics  . Alcohol use: No  . Drug use: No    Comment: prescribed    Home Medications Prior to Admission medications   Medication Sig Start Date End Date Taking? Authorizing Provider  buPROPion (WELLBUTRIN SR) 150 MG 12 hr tablet TAKE 1 TABLET BY MOUTH DAILY Patient taking differently: Take 150 mg by mouth daily.  12/16/18  Yes Forrest Moron, MD  diphenoxylate-atropine (LOMOTIL) 2.5-0.025 MG tablet Take 2 tablets by mouth 4 (four) times daily as needed for diarrhea or loose stools.  12/19/18  Yes [provider]  escitalopram (LEXAPRO) 10 MG tablet Take 10 mg by mouth daily.  02/23/20  Yes [provider]  ferrous sulfate (FERROUSUL) 325 (65 FE) MG tablet Take 1 tablet (325 mg total) by mouth 3 (three) times daily with meals. 03/28/19  Yes Babish, Rodman Key, PA-C  levothyroxine (SYNTHROID, LEVOTHROID) 100 MCG tablet TAKE 1 TABLET BY MOUTH BEFORE BREAKFAST MONDAY-FRIDAY SKIP SATURDAY ANS SUNDAY Patient taking differently: Take 100 mcg by mouth daily.  04/26/18  Yes Stallings, Zoe A, MD  methocarbamol (ROBAXIN) 500 MG tablet Take 500 mg by mouth 2 (two) times daily. 04/06/20  Yes [provider]  morphine (MS CONTIN) 30 MG 12 hr tablet Take 1 tablet (30 mg total) by mouth every 12 (twelve) hours. 07/18/18  Yes Kristeen Miss, MD  ondansetron (ZOFRAN) 4 MG tablet Take 4 mg by mouth 3 (three) times daily as needed for nausea/vomiting. 12/03/18  Yes [provider]  pantoprazole (PROTONIX) 40 MG tablet Take 40 mg by mouth daily.  01/24/19  Yes [provider]  polyethylene glycol (MIRALAX / GLYCOLAX) 17 g packet Take 17 g by mouth 2 (two) times daily. 03/28/19  Yes Babish, Rodman Key, PA-C  psyllium (REGULOID) 0.52 g capsule Take 0.52 g by  mouth in the morning and at bedtime.   Yes [provider]  vitamin B-12 (CYANOCOBALAMIN) 1000 MCG tablet Take 1 tablet by mouth daily.   Yes [provider]  zolpidem (AMBIEN) 5 MG tablet Take 5 mg by mouth at bedtime.  12/02/18  Yes [provider]  acetaminophen (TYLENOL) 500 MG tablet Take 2 tablets (1,000 mg total) by mouth every 8 (eight) hours. 03/28/19   Danae Orleans, PA-C  amLODipine (NORVASC) 10 MG tablet Take 10 mg by mouth daily.    [provider]  oxyCODONE (OXY IR/ROXICODONE) 5 MG immediate release tablet Take 1-2 tablets (5-10 mg total) by mouth every 4 (four) hours as needed for moderate pain or severe pain. Patient not taking: Reported on 05/13/2019 03/28/19   Danae Orleans, PA-C    Allergies    Amoxicillin, Ampicillin, and Buspar [buspirone]  Review of Systems   Review of Systems  Constitutional: Negative for fever.  HENT: Negative for ear pain and sore throat.   Eyes: Negative for pain and visual disturbance.  Respiratory: Negative for cough and shortness of breath.   Cardiovascular: Negative for chest pain.  Gastrointestinal: Negative for abdominal pain, constipation, diarrhea, nausea and vomiting.  Genitourinary: Negative for dysuria.  Musculoskeletal: Negative for arthralgias and back pain.       Right hip pain  Skin: Negative for rash.  Neurological: Negative for headaches.  All other systems reviewed and are negative.   Physical Exam Updated Vital Signs BP (!) 154/70   Pulse 68   Temp 98.7 F (37.1 C) (Oral)   Resp 15   SpO2 100%   Physical Exam Vitals and nursing note reviewed.  Constitutional:      General: She is not in acute distress.    Appearance: She is well-developed.  HENT:     Head: Normocephalic and atraumatic.  Eyes:     Conjunctiva/sclera: Conjunctivae normal.  Cardiovascular:     Rate and Rhythm: Normal rate and regular rhythm.     Heart sounds: No murmur heard.   Pulmonary:     Effort:  Pulmonary effort is normal. No respiratory distress.     Breath sounds: Normal breath sounds.  Abdominal:     Palpations: Abdomen is soft.     Tenderness: There is no abdominal tenderness.  Musculoskeletal:     Cervical back: Neck  supple.     Comments: Right hip TTP with obvious deformity. Shortening and internal rotation of the RLE.  Skin:    General: Skin is warm and dry.  Neurological:     Mental Status: She is alert.     ED Results / Procedures / Treatments   Labs (all labs ordered are listed, but only abnormal results are displayed) Labs Reviewed - No data to display  EKG None  Radiology DG Pelvis Portable  Result Date: 04/22/2020 CLINICAL DATA:  Post reduction EXAM: PORTABLE PELVIS 1-2 VIEWS COMPARISON:  04/23/2019 FINDINGS: Surgical hardware in the lumbar spine. Status post right hip replacement with interval reduction of right femoral component dislocation. Normal alignment on single view. No fracture is seen. IMPRESSION: Right hip replacement with interval reduction of previously noted dislocation of the right femoral component. Electronically Signed   By: Donavan Foil M.D.   On: 04/22/2020 15:16   DG Pelvis Portable  Result Date: 04/22/2020 CLINICAL DATA:  Status post attempt at hip relocation. EXAM: PORTABLE PELVIS 1-2 VIEWS COMPARISON:  Prior study 04/22/2020. FINDINGS: Right hip dislocation again noted. No evidence of relocation. Hardware intact. Prior lumbar spine fusion. Surgical clips in the pelvis. Diffuse osteopenia and degenerative change. No acute bony abnormality. IMPRESSION: Persistent right hip dislocation. Electronically Signed   By: Marcello Moores  Register   On: 04/22/2020 13:28   DG Hip Port Black W or Wo Pelvis 1 View Right  Result Date: 04/22/2020 CLINICAL DATA:  Hip dislocation, prior hip replacement EXAM: DG HIP (WITH OR WITHOUT PELVIS) 1V PORT RIGHT COMPARISON:  03/09/2020 FINDINGS: Components of a RIGHT total hip arthroplasty are again identified. Posterior  dislocation of the femoral component from the acetabular cup of the RIGHT hip prosthesis. No fractures identified. Prior lumbosacral fusion. IMPRESSION: Posterior dislocation of RIGHT hip prosthesis. Electronically Signed   By: Lavonia Dana M.D.   On: 04/22/2020 11:35    Procedures Procedures (including critical care time)  Medications Ordered in ED Medications  fentaNYL (SUBLIMAZE) injection 50 mcg (has no administration in time range)  HYDROmorphone (DILAUDID) injection 1 mg (1 mg Intravenous Given 04/22/20 1134)  0.9 %  sodium chloride infusion ( Intravenous New Bag/Given 04/22/20 1259)  propofol (DIPRIVAN) 10 mg/mL bolus/IV push 50 mg (50 mg Intravenous Given 04/22/20 1211)  propofol (DIPRIVAN) 10 mg/mL bolus/IV push (20 mg Intravenous Given 04/22/20 1237)  HYDROmorphone (DILAUDID) injection 1 mg (1 mg Intravenous Given 04/22/20 1256)  fentaNYL (SUBLIMAZE) injection 100 mcg (100 mcg Intravenous Given 04/22/20 1340)  propofol (DIPRIVAN) 10 mg/mL bolus/IV push (20 mg Intravenous Given 04/22/20 1351)  propofol (DIPRIVAN) 10 mg/mL bolus/IV push 50 mg (50 mg Intravenous Given 04/22/20 1442)  propofol (DIPRIVAN) 10 mg/mL bolus/IV push (30 mg Intravenous Given 04/22/20 1441)    ED Course  I have reviewed the triage vital signs and the nursing notes.  Pertinent labs & imaging results that were available during my care of the patient were reviewed by me and considered in my medical decision making (see chart for details).    MDM Rules/Calculators/A&P                          69 y/o female presenting for evaluation of right hip pain. Has history of total right hip prosthesis which has been dislocated once before. States was putting on socks and hip became dislocated. X-ray reviewed/interpreted and does confirm posterior hip dislocation.  Patient was consciously sedated and multiple attempts were made to reduce the hip,  repeat x-ray was performed which shows continued dislocation of the right hip. This was  attempted x2 without success. Will consult orthopedics.  2:18 PM CONSULT with Dr. Alvan Dame who states he is not on call but he will contact the person on call.   2:24 PM CONSULT with Dr. Stann Mainland who will call someone at [name] to come down to help with a reduction.   2:30 PM CONSULT with Dr. Stann Mainland, who states Dr. Lyla Glassing will come down and eval pt in ED.  Pt was consciously sedated again and Dr. Lyla Glassing was successfully able to reduce the hip. Reviewed final xray and it dose appear to be reduced.   Pt placed in knee immobilizer. States she was placed in immobilizer last month and was able to get around without crutches.  We will have her follow-up with Dr. Ihor Gully as an outpatient.  Have advised on specific return precautions.  She voices understanding the plan and reasons to return.  All questions answered.  Patient stable for discharge.  Final Clinical Impression(s) / ED Diagnoses Final diagnoses:  Dislocation of right hip, initial encounter [facility])    Rx / DC Orders ED Discharge Orders    None       Bishop Dublin 04/22/20 1548    Charlesetta Shanks, MD 04/23/20 1037

## 2020-04-22 NOTE — Consult Note (Signed)
ORTHOPAEDIC CONSULTATION  REQUESTING PHYSICIAN: Charlesetta Shanks, MD  PCP:  Bernerd Limbo, MD  Chief Complaint: dislocated right total hip arthroplasty  HPI: [name] is a 69 y.o. female who complains of right hip pain after she was putting on her socks earlier this morning.  She noted a pop in the right hip.  She had right hip pain and inability to weight-bear.  She was taken to the emergency department at [facility] which revealed a posterior dislocation of right total hip arthroplasty.  She underwent primary right total hip arthroplasty by Dr. Mardelle Matte in April 2020 followed by revision acetabular component by Dr. Alvan Dame in [place] 2020.  This is her second episode of hip instability since the revision.  The emergency department physicians attempted conscious sedation and closed reduction unsuccessfully.  Orthopedic surgery was then consulted for management.  Past Medical History:  Diagnosis Date  . Anemia   . Anxiety   . Arthritis    "everywhere" (08/19/2013)  . Bilateral carpal tunnel syndrome   . Chest pain at rest, rule out pericarditis 11/27/2012  . CHF (congestive heart failure) ([place])   . Chronic kidney disease    dr Risa Grill  . Chronic lower back pain   . Closed displaced fracture of right femoral neck ([place]) 02/18/2019  . Complication of anesthesia    "woke up twice during knee replacement" (08/19/2013)  . Depression   . Dyspnea    w/ exertion    . Fibromyalgia   . Headache(784.0)    hx of migraines   . History of pericarditis, with pericardial window in 2009 11/27/2012  . Hx of ovarian cancer 11/27/2012  . Hypothyroidism 11/27/2012  . Iron deficiency anemia   . Kidney carcinoma ([place])    "both kidneys; ~ 3 yr apart" (08/19/2013)  . Lung cancer ([place]) 2018  . Melanoma of back ([place])   . Migraines   . Pneumonia    "several times; including today", RUL/notes 08/19/2013  . Squamous carcinoma    "face, side of my nose, chest; used acid to get rid of them"  (08/19/2013)  . Venous insufficiency 11/27/2012   Past Surgical History:  Procedure Laterality Date  . ABDOMINAL HYSTERECTOMY  1979  . ANTERIOR LAT LUMBAR FUSION Left 07/16/2018   Procedure: Left Lumbar two-three  Anterolateral decompression/interbody fusion with lateral plate fixation;  Surgeon: Kristeen Miss, MD;  Location: [place];  Service: Neurosurgery;  Laterality: Left;  . APPENDECTOMY    . DOPPLER ECHOCARDIOGRAPHY  02/23/2011   LVEF>50%  . FRACTURE SURGERY    . KNEE ARTHROSCOPY Right    "twice before the replacement" (08/19/2013)  . LEFT OOPHORECTOMY Left 1979  . [place]   "ruptured disc"  . MELANOMA EXCISION  ~ 2010   "my lower back" (08/19/2013)  . ORIF HIP FRACTURE Left    x 2 age 23 and 4  . PARTIAL NEPHRECTOMY Right 2005; ~ 2008  . PERICARDIAL WINDOW  ~ 2012  . POSTERIOR LUMBAR FUSION  2002?; 03/2012  . REDUCTION MAMMAPLASTY Bilateral ~ 2008  . RIGHT OOPHORECTOMY  ~ 2007   "it had cancer in it" (08/19/2013)  . SHOULDER ARTHROSCOPY W/ ROTATOR CUFF REPAIR Right 1990's  . TONSILLECTOMY  1954  . TOTAL HIP ARTHROPLASTY Right 02/18/2019   Procedure: TOTAL HIP ARTHROPLASTY;  Surgeon: Marchia Bond, MD;  Location: [place];  Service: Orthopedics;  Laterality: Right;  . TOTAL HIP REVISION Right 03/27/2019   Procedure: TOTAL HIP REVISION;  Surgeon: Alvan Dame,  Rodman Key, MD;  Location: [facility];  Service: Orthopedics;  Laterality: Right;  . TOTAL KNEE ARTHROPLASTY Right 1990's  . TOTAL KNEE ARTHROPLASTY Left 08/02/2015   Procedure: LEFT TOTAL KNEE ARTHROPLASTY;  Surgeon: Paralee Cancel, MD;  Location: [facility];  Service: Orthopedics;  Laterality: Left;  . TOTAL SHOULDER REPLACEMENT Left 2006?  . TUBAL LIGATION  1952  . VIDEO ASSISTED THORACOSCOPY (VATS)/WEDGE RESECTION Right 09/28/2017   Procedure: RIGHT VIDEO ASSISTED THORACOSCOPY (VATS)/WEDGE RESECTION, LYMPH NODE DISSECTION ;  Surgeon: Melrose Nakayama, MD;  Location: [place];  Service: Thoracic;  Laterality: Right;   Social  History   Socioeconomic History  . Marital status: Married    Spouse name: Not on file  . Number of children: Not on file  . Years of education: Not on file  . Highest education level: Not on file  Occupational History  . Not on file  Tobacco Use  . Smoking status: Former Smoker    Packs/day: 1.50    Years: 18.00    Pack years: 27.00    Types: Cigarettes    Quit date: 09/22/1985    Years since quitting: 34.6  . Smokeless tobacco: Never Used  Vaping Use  . Vaping Use: Never used  Substance and Sexual Activity  . Alcohol use: No  . Drug use: No    Comment: prescribed  . Sexual activity: Not Currently  Other Topics Concern  . Not on file  Social History Narrative   Married.  Lives with husband.  Two children one boy and one girl.  Three granddaughters and two great granddaughters.    Social Determinants of Health   Financial Resource Strain:   . Difficulty of Paying Living Expenses:   Food Insecurity:   . Worried About Charity fundraiser in the Last Year:   . Arboriculturist in the Last Year:   Transportation Needs:   . Film/video editor (Medical):   Marland Kitchen Lack of Transportation (Non-Medical):   Physical Activity:   . Days of Exercise per Week:   . Minutes of Exercise per Session:   Stress:   . Feeling of Stress :   Social Connections:   . Frequency of Communication with Friends and Family:   . Frequency of Social Gatherings with Friends and Family:   . Attends Religious Services:   . Active Member of Clubs or Organizations:   . Attends Archivist Meetings:   Marland Kitchen Marital Status:    Family History  Problem Relation Age of Onset  . Cancer Mother 15       Stomach  . Cancer Father 66       Lung cancer  . Bipolar disorder Sister    Allergies  Allergen Reactions  . Amoxicillin Other (See Comments)    UNSPECIFIED REACTION  Has patient had a PCN reaction causing immediate rash, facial/tongue/throat swelling, SOB or lightheadedness with hypotension:  No Has patient had a PCN reaction causing severe rash involving mucus membranes or skin necrosis: No Has patient had a PCN reaction that required hospitalization No Has patient had a PCN reaction occurring within the last 10 years: No If all of the above answers are "NO", then may proceed with Cephalosporin use.  . Ampicillin Rash and Other (See Comments)    Has patient had a PCN reaction causing immediate rash, facial/tongue/throat swelling, SOB or lightheadedness with hypotension: No Has patient had a PCN reaction causing severe rash involving mucus membranes or skin necrosis: No Has patient had a  PCN reaction that required hospitalization No Has patient had a PCN reaction occurring within the last 10 years: No If all of the above answers are "NO", then may proceed with Cephalosporin use.  [place] [Buspirone] Nausea And Vomiting    Patient can tolerate   Prior to Admission medications   Medication Sig Start Date End Date Taking? Authorizing Provider  buPROPion (WELLBUTRIN SR) 150 MG 12 hr tablet TAKE 1 TABLET BY MOUTH DAILY Patient taking differently: Take 150 mg by mouth daily.  12/16/18  Yes Forrest Moron, MD  diphenoxylate-atropine (LOMOTIL) 2.5-0.025 MG tablet Take 2 tablets by mouth 4 (four) times daily as needed for diarrhea or loose stools.  12/19/18  Yes [provider]  escitalopram (LEXAPRO) 10 MG tablet Take 10 mg by mouth daily. 02/23/20  Yes [provider]  ferrous sulfate (FERROUSUL) 325 (65 FE) MG tablet Take 1 tablet (325 mg total) by mouth 3 (three) times daily with meals. 03/28/19  Yes Babish, Rodman Key, PA-C  levothyroxine (SYNTHROID, LEVOTHROID) 100 MCG tablet TAKE 1 TABLET BY MOUTH BEFORE BREAKFAST MONDAY-FRIDAY SKIP SATURDAY ANS SUNDAY Patient taking differently: Take 100 mcg by mouth daily.  04/26/18  Yes Stallings, Zoe A, MD  methocarbamol (ROBAXIN) 500 MG tablet Take 500 mg by mouth 2 (two) times daily. 04/06/20  Yes [provider]  morphine  (MS CONTIN) 30 MG 12 hr tablet Take 1 tablet (30 mg total) by mouth every 12 (twelve) hours. 07/18/18  Yes Kristeen Miss, MD  ondansetron (ZOFRAN) 4 MG tablet Take 4 mg by mouth 3 (three) times daily as needed for nausea/vomiting. 12/03/18  Yes [provider]  pantoprazole (PROTONIX) 40 MG tablet Take 40 mg by mouth daily.  01/24/19  Yes [provider]  polyethylene glycol (MIRALAX / GLYCOLAX) 17 g packet Take 17 g by mouth 2 (two) times daily. 03/28/19  Yes Babish, Rodman Key, PA-C  psyllium (REGULOID) 0.52 g capsule Take 0.52 g by mouth in the morning and at bedtime.   Yes [provider]  vitamin B-12 (CYANOCOBALAMIN) 1000 MCG tablet Take 1 tablet by mouth daily.   Yes [provider]  zolpidem (AMBIEN) 5 MG tablet Take 5 mg by mouth at bedtime.  12/02/18  Yes [provider]  acetaminophen (TYLENOL) 500 MG tablet Take 2 tablets (1,000 mg total) by mouth every 8 (eight) hours. 03/28/19   Danae Orleans, PA-C  amLODipine (NORVASC) 10 MG tablet Take 10 mg by mouth daily.    [provider]  oxyCODONE (OXY IR/ROXICODONE) 5 MG immediate release tablet Take 1-2 tablets (5-10 mg total) by mouth every 4 (four) hours as needed for moderate pain or severe pain. Patient not taking: Reported on 05/13/2019 03/28/19   Danae Orleans, PA-C   DG Pelvis Portable  Result Date: 04/22/2020 CLINICAL DATA:  Post reduction EXAM: PORTABLE PELVIS 1-2 VIEWS COMPARISON:  04/23/2019 FINDINGS: Surgical hardware in the lumbar spine. Status post right hip replacement with interval reduction of right femoral component dislocation. Normal alignment on single view. No fracture is seen. IMPRESSION: Right hip replacement with interval reduction of previously noted dislocation of the right femoral component. Electronically Signed   By: Donavan Foil M.D.   On: 04/22/2020 15:16   DG Pelvis Portable  Result Date: 04/22/2020 CLINICAL DATA:  Status post attempt at hip relocation. EXAM: PORTABLE  PELVIS 1-2 VIEWS COMPARISON:  Prior study 04/22/2020. FINDINGS: Right hip dislocation again noted. No evidence of relocation. Hardware intact. Prior lumbar spine fusion. Surgical clips in the pelvis.  Diffuse osteopenia and degenerative change. No acute bony abnormality. IMPRESSION: Persistent right hip dislocation. Electronically Signed   By: Marcello Moores  Register   On: 04/22/2020 13:28   DG Hip [place] W or Wo Pelvis 1 View Right  Result Date: 04/22/2020 CLINICAL DATA:  Hip dislocation, prior hip replacement EXAM: DG HIP (WITH OR WITHOUT PELVIS) 1V PORT RIGHT COMPARISON:  03/09/2020 FINDINGS: Components of a RIGHT total hip arthroplasty are again identified. Posterior dislocation of the femoral component from the acetabular cup of the RIGHT hip prosthesis. No fractures identified. Prior lumbosacral fusion. IMPRESSION: Posterior dislocation of RIGHT hip prosthesis. Electronically Signed   By: Lavonia Dana M.D.   On: 04/22/2020 11:35    Positive ROS: All other systems have been reviewed and were otherwise negative with the exception of those mentioned in the HPI and as above.  Physical Exam: General: sleepy, no acute distress Cardiovascular: No pedal edema Respiratory: No cyanosis, no use of accessory musculature GI: No organomegaly, abdomen is soft and non-tender Skin: No lesions in the area of chief complaint Neurologic: Sensation intact distally Psychiatric: Patient is competent for consent with normal mood and affect Lymphatic: No axillary or cervical lymphadenopathy  MUSCULOSKELETAL: Examination of the right hip reveals a healed posterior incision.  There is no erythema.  She has obvious shortening of the right lower extremity with internal rotation.  The foot is well-perfused.  Detailed motor and sensory exam are unable to be performed due to mental status from recent sedation.  Assessment: Dislocated right total hip arthroplasty, second occurrence  Plan: I discussed the findings with the  patient.  The emergency department physicians administered conscious sedation, and I was able to perform a closed reduction with a single reduction maneuver.  The reduction did require a significant amount of traction.  The hip reduced with an audible and palpable clunk.  Postreduction pelvis x-ray obtained in the room and interpreted by myself showed concentric reduction of the hip.  I have contacted [facility] for placement of a right hip abduction brace in the ED prior to discharge.  The patient will wear the brace at all times except for hygiene.  She will call the office to schedule a follow-up appointment with Dr. Alvan Dame in 2 weeks.  All questions were welcomed and answered.    Bertram Savin, MD [phone]    04/22/2020 3:53 PM

## 2020-04-22 NOTE — Discharge Instructions (Signed)
Please wear the knee immobilizer until you follow-up with orthopedics.  You can take Tylenol for pain.  You will need to call Dr. Johnn Hai office to schedule an appointment for follow-up.  Please return to the emergency department for any new or worsening symptoms.

## 2020-04-22 NOTE — ED Triage Notes (Signed)
Pt presents with c/o right hip injury. Pt has a hx of hip replacement on that same side with a prior dislocation. Pt reports that she was putting her sock on and felt a pop. Pt has deformity and shortening on that side per EMS. Pt given 150 mcg of fentanyl given en route.

## 2020-04-23 NOTE — ED Provider Notes (Signed)
[facility]  Date/Time: 04/23/2020 10:38 AM Performed by: Charlesetta Shanks, MD Authorized by: Charlesetta Shanks, MD   Consent:    Consent obtained:  Verbal   Consent given by:  Patient   Risks discussed:  Recurrent dislocation, fracture, nerve damage, irreducible dislocation, stiffness, vascular damage and restricted joint movement   Alternatives discussed:  Referral and delayed treatmentInjury location: hip Location details: right hip Injury type: dislocation Dislocation type: posterior Spontaneous dislocation: yes Prosthesis: yes Pre-procedure neurovascular assessment: neurovascularly intact Pre-procedure distal perfusion: normal Pre-procedure neurological function: normal Pre-procedure range of motion: reduced  Anesthesia: Local anesthesia used: no  Patient sedated: Yes. Refer to sedation procedure documentation for details of sedation. Manipulation performed: yes Reduction method: traction and counter traction, abduction and extension Post-procedure distal perfusion: normal Post-procedure neurological function: normal Post-procedure range of motion: unchanged Comments: After 2 attempts at reduction with 2 separate episodes of sedation with propofol, hip not reduced.  .Sedation  Date/Time: 04/23/2020 10:41 AM Performed by: Charlesetta Shanks, MD Authorized by: Charlesetta Shanks, MD   Consent:    Consent obtained:  Verbal   Consent given by:  Patient   Risks discussed:  Allergic reaction, dysrhythmia, inadequate sedation, nausea, prolonged sedation necessitating reversal, prolonged hypoxia resulting in organ damage, respiratory compromise necessitating ventilatory assistance and intubation and vomiting Universal protocol:    Immediately prior to procedure a time out was called: yes     Patient identity confirmation method:  Arm band and verbally with patient Indications:    Procedure performed:  Dislocation reduction   Procedure necessitating sedation performed by:   Physician performing sedation Pre-sedation assessment:    Time since last food or drink:  Midnight   ASA classification: class 2 - patient with mild systemic disease     Neck mobility: normal     Mouth opening:  3 or more finger widths   Thyromental distance:  4 finger widths   Mallampati score:  I - soft palate, uvula, fauces, pillars visible   Pre-sedation assessments completed and reviewed: airway patency, cardiovascular function, hydration status, mental status, nausea/vomiting, pain level, respiratory function and temperature   Procedure details (see MAR for exact dosages):    Preoxygenation:  Nasal cannula   Sedation:  Propofol   Intended level of sedation: deep   Intra-procedure monitoring:  Blood pressure monitoring, cardiac monitor, continuous capnometry, continuous pulse oximetry, frequent LOC assessments and frequent vital sign checks   Intra-procedure events: none     Intra-procedure management:  Airway repositioning   Total Provider sedation time (minutes):  20 Post-procedure details:    Attendance: Constant attendance by certified staff until patient recovered     Recovery: Patient returned to pre-procedure baseline     Post-sedation assessments completed and reviewed: airway patency, cardiovascular function, hydration status, mental status, nausea/vomiting, pain level and respiratory function     Patient is stable for discharge or admission: yes     Patient tolerance:  Tolerated well, no immediate complications Comments:     Patient tolerated sedation well without airway problems. .Sedation  Date/Time: 04/23/2020 10:44 AM Performed by: Charlesetta Shanks, MD Authorized by: Charlesetta Shanks, MD   Consent:    Consent obtained:  Verbal   Consent given by:  Patient   Risks discussed:  Allergic reaction, dysrhythmia, inadequate sedation, nausea, prolonged hypoxia resulting in organ damage, prolonged sedation necessitating reversal, respiratory compromise necessitating  ventilatory assistance and intubation and vomiting Universal protocol:    Immediately prior to procedure a time out was called: yes   Indications:  Procedure performed:  Dislocation reduction   Procedure necessitating sedation performed by:  Different physician Pre-sedation assessment:    Time since last food or drink:  Midnight   ASA classification: class 2 - patient with mild systemic disease     Neck mobility: normal     Mouth opening:  3 or more finger widths   Thyromental distance:  4 finger widths   Mallampati score:  I - soft palate, uvula, fauces, pillars visible   Pre-sedation assessments completed and reviewed: airway patency, cardiovascular function, hydration status, mental status, nausea/vomiting, pain level and respiratory function   Immediate pre-procedure details:    Reassessment: Patient reassessed immediately prior to procedure     Reviewed: vital signs and relevant labs/tests     Verified: bag valve mask available, emergency equipment available, intubation equipment available, IV patency confirmed, oxygen available, reversal medications available and suction available   Procedure details (see MAR for exact dosages):    Preoxygenation:  Nasal cannula   Sedation:  Propofol   Intended level of sedation: deep   Intra-procedure monitoring:  Blood pressure monitoring, cardiac monitor, continuous capnometry, continuous pulse oximetry, frequent LOC assessments and frequent vital sign checks   Intra-procedure events: none     Intra-procedure management:  Airway repositioning   Total Provider sedation time (minutes):  20 Post-procedure details:    Recovery: Patient returned to pre-procedure baseline     Post-sedation assessments completed and reviewed: airway patency, cardiovascular function, hydration status, mental status, nausea/vomiting, pain level and respiratory function     Patient tolerance:  Tolerated well, no immediate complications Comments:     Sedation provided  for Dr. Lyla Glassing for dislocation reduction.  Patient tolerated well.  Returned to normal baseline without difficulty.     Charlesetta Shanks, MD 04/23/20 1046

## 2020-05-23 ENCOUNTER — Emergency Department (HOSPITAL_COMMUNITY): Payer: Medicare Other

## 2020-05-23 ENCOUNTER — Emergency Department (HOSPITAL_COMMUNITY)
Admission: EM | Admit: 2020-05-23 | Discharge: 2020-05-24 | Disposition: A | Discharge status: Home / Self Care | Payer: Medicare Other | Attending: Emergency Medicine | Admitting: Emergency Medicine

## 2020-05-23 DIAGNOSIS — Y939 Activity, unspecified: Secondary | ICD-10-CM | POA: Diagnosis not present

## 2020-05-23 DIAGNOSIS — S73004A Unspecified dislocation of right hip, initial encounter: Secondary | ICD-10-CM | POA: Insufficient documentation

## 2020-05-23 DIAGNOSIS — X500XXA Overexertion from strenuous movement or load, initial encounter: Secondary | ICD-10-CM | POA: Diagnosis not present

## 2020-05-23 DIAGNOSIS — I10 Essential (primary) hypertension: Secondary | ICD-10-CM | POA: Insufficient documentation

## 2020-05-23 DIAGNOSIS — Z85118 Personal history of other malignant neoplasm of bronchus and lung: Secondary | ICD-10-CM | POA: Insufficient documentation

## 2020-05-23 DIAGNOSIS — I509 Heart failure, unspecified: Secondary | ICD-10-CM | POA: Diagnosis not present

## 2020-05-23 DIAGNOSIS — Z96651 Presence of right artificial knee joint: Secondary | ICD-10-CM | POA: Insufficient documentation

## 2020-05-23 DIAGNOSIS — Z8543 Personal history of malignant neoplasm of ovary: Secondary | ICD-10-CM | POA: Insufficient documentation

## 2020-05-23 DIAGNOSIS — Z87891 Personal history of nicotine dependence: Secondary | ICD-10-CM | POA: Insufficient documentation

## 2020-05-23 DIAGNOSIS — Y929 Unspecified place or not applicable: Secondary | ICD-10-CM | POA: Insufficient documentation

## 2020-05-23 DIAGNOSIS — E039 Hypothyroidism, unspecified: Secondary | ICD-10-CM | POA: Insufficient documentation

## 2020-05-23 DIAGNOSIS — Y999 Unspecified external cause status: Secondary | ICD-10-CM | POA: Insufficient documentation

## 2020-05-23 DIAGNOSIS — Z85528 Personal history of other malignant neoplasm of kidney: Secondary | ICD-10-CM | POA: Diagnosis not present

## 2020-05-23 DIAGNOSIS — S79911A Unspecified injury of right hip, initial encounter: Secondary | ICD-10-CM | POA: Diagnosis present

## 2020-05-23 MED ORDER — FENTANYL CITRATE (PF) 100 MCG/2ML IJ SOLN
50.0000 ug | INTRAMUSCULAR | Status: DC | PRN
  Administered 2020-05-23: 50 ug via INTRAVENOUS
  Filled 2020-05-23: qty 2

## 2020-05-23 MED ORDER — ONDANSETRON HCL 4 MG/2ML IJ SOLN
4.0000 mg | Freq: Once | INTRAMUSCULAR | Status: AC
  Administered 2020-05-23: 4 mg via INTRAVENOUS
  Filled 2020-05-23: qty 2

## 2020-05-23 MED ORDER — SODIUM CHLORIDE 0.9 % IV BOLUS
500.0000 mL | Freq: Once | INTRAVENOUS | Status: AC
  Administered 2020-05-23: 500 mL via INTRAVENOUS

## 2020-05-23 MED ORDER — PROPOFOL 10 MG/ML IV BOLUS
INTRAVENOUS | Status: AC | PRN
  Administered 2020-05-23: 40 mg via INTRAVENOUS

## 2020-05-23 MED ORDER — KETAMINE HCL 10 MG/ML IJ SOLN
INTRAMUSCULAR | Status: AC | PRN
  Administered 2020-05-23: 63.5 mg via INTRAVENOUS

## 2020-05-23 MED ORDER — KETAMINE HCL 50 MG/5ML IJ SOSY
50.0000 mg | PREFILLED_SYRINGE | Freq: Once | INTRAMUSCULAR | Status: AC
  Administered 2020-05-23: 40 mg via INTRAVENOUS
  Filled 2020-05-23: qty 5

## 2020-05-23 MED ORDER — PROPOFOL 10 MG/ML IV BOLUS
1.0000 mg/kg | Freq: Once | INTRAVENOUS | Status: AC
  Administered 2020-05-23: 65.3 mg via INTRAVENOUS
  Filled 2020-05-23: qty 20

## 2020-05-23 MED ORDER — FENTANYL CITRATE (PF) 100 MCG/2ML IJ SOLN
50.0000 ug | Freq: Once | INTRAMUSCULAR | Status: AC
  Administered 2020-05-23: 50 ug via INTRAVENOUS
  Filled 2020-05-23: qty 2

## 2020-05-23 NOTE — ED Provider Notes (Addendum)
[facility] EMERGENCY DEPARTMENT Provider Note   CSN: [id number] Arrival date & time: 05/23/20  1954     History Chief Complaint  Patient presents with  . Hip Pain    [name] is a 69 y.o. female.  Patient bent over and felt her right hip popped out.  History of the same.  Not hit her head or lose consciousness.  The history is provided by the patient.  Hip Pain This is a recurrent problem. The current episode started less than 1 hour ago. The problem occurs constantly. The problem has not changed since onset.Pertinent negatives include no chest pain, no abdominal pain and no shortness of breath. Nothing aggravates the symptoms. Nothing relieves the symptoms. She has tried nothing for the symptoms. The treatment provided no relief.       Past Medical History:  Diagnosis Date  . Anemia   . Anxiety   . Arthritis    "everywhere" (08/19/2013)  . Bilateral carpal tunnel syndrome   . Chest pain at rest, rule out pericarditis 11/27/2012  . CHF (congestive heart failure) ([place])   . Chronic kidney disease    dr Risa Grill  . Chronic lower back pain   . Closed displaced fracture of right femoral neck ([place]) 02/18/2019  . Complication of anesthesia    "woke up twice during knee replacement" (08/19/2013)  . Depression   . Dyspnea    w/ exertion    . Fibromyalgia   . Headache(784.0)    hx of migraines   . History of pericarditis, with pericardial window in 2009 11/27/2012  . Hx of ovarian cancer 11/27/2012  . Hypothyroidism 11/27/2012  . Iron deficiency anemia   . Kidney carcinoma ([place])    "both kidneys; ~ 3 yr apart" (08/19/2013)  . Lung cancer ([place]) 2018  . Melanoma of back ([place])   . Migraines   . Pneumonia    "several times; including today", RUL/notes 08/19/2013  . Squamous carcinoma    "face, side of my nose, chest; used acid to get rid of them" (08/19/2013)  . Venous insufficiency 11/27/2012    Patient Active Problem List   Diagnosis Date Noted  . S/P  right TH revision 03/27/2019  . 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 Closed displaced fracture of right femoral neck ([place]) 02/17/2019  . Leukocytosis 02/17/2019  . Normocytic normochromic anemia 02/17/2019  . Essential hypertension 02/17/2019  . Malnutrition of moderate degree 12/06/2018  . Pressure injury of skin 12/05/2018  . Hypomagnesemia 12/04/2018  . Dehydration 12/04/2018  . Weight loss 12/04/2018  . Colitis 12/04/2018  . 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 Benzodiazepine withdrawal without complication ([place]) 78/46/9629  . Hypokalemia 03/13/2018  . Acute on chronic renal insufficiency 03/13/2018  . Generalized anxiety disorder 03/13/2018  . Acute cognitive decline 03/13/2018  . Adenocarcinoma of right lung, stage 1 ([place]) 10/22/2017  . Chronic pain syndrome 07/25/2017  . S/P left TKA 08/02/2015  . S/P knee replacement 08/02/2015  . Carpal tunnel syndrome 04/23/2015  . Autoimmune disease ([place]) 03/29/2013  . Apnea, sleep 03/29/2013  . Anxiety 11/28/2012  . Seizure disorder ([place]) 11/28/2012  . Anxiety state 11/28/2012  . History of pericarditis, with pericardial window in 2009, and recurrent episodes 2011,2013, treated with methotrexate 11/27/2012  . SOB (shortness of breath) 11/27/2012  . Hypothyroidism 11/27/2012  . History of partial nephrectomy, both rt. and lt 11/27/2012  . Hx of ovarian cancer 11/27/2012  . Venous insufficiency 11/27/2012  .  Chronic back pain, hx of lumbar spondylosis and stenosis L5-S1 with hx decompression and total diskectomy 11/27/2012  . Hx of melanoma of skin 11/27/2012  . 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 Chronic venous insufficiency 11/27/2012  . DIARRHEA-PRESUMED INFECTIOUS 12/27/2009    Past Surgical History:  Procedure Laterality Date  . ABDOMINAL HYSTERECTOMY  1979  . ANTERIOR LAT LUMBAR FUSION Left 07/16/2018   Procedure: Left Lumbar two-three  Anterolateral  decompression/interbody fusion with lateral plate fixation;  Surgeon: Kristeen Miss, MD;  Location: [facility];  Service: Neurosurgery;  Laterality: Left;  . APPENDECTOMY    . DOPPLER ECHOCARDIOGRAPHY  02/23/2011   LVEF>50%  . FRACTURE SURGERY    . KNEE ARTHROSCOPY Right    "twice before the replacement" (08/19/2013)  . LEFT OOPHORECTOMY Left 1979  . [place]   "ruptured disc"  . MELANOMA EXCISION  ~ 2010   "my lower back" (08/19/2013)  . ORIF HIP FRACTURE Left    x 2 age 22 and 4  . PARTIAL NEPHRECTOMY Right 2005; ~ 2008  . PERICARDIAL WINDOW  ~ 2012  . POSTERIOR LUMBAR FUSION  2002?; 03/2012  . REDUCTION MAMMAPLASTY Bilateral ~ 2008  . RIGHT OOPHORECTOMY  ~ 2007   "it had cancer in it" (08/19/2013)  . SHOULDER ARTHROSCOPY W/ ROTATOR CUFF REPAIR Right 1990's  . TONSILLECTOMY  1954  . TOTAL HIP ARTHROPLASTY Right 02/18/2019   Procedure: TOTAL HIP ARTHROPLASTY;  Surgeon: Marchia Bond, MD;  Location: [facility];  Service: Orthopedics;  Laterality: Right;  . TOTAL HIP REVISION Right 03/27/2019   Procedure: TOTAL HIP REVISION;  Surgeon: Paralee Cancel, MD;  Location: [facility];  Service: Orthopedics;  Laterality: Right;  . TOTAL KNEE ARTHROPLASTY Right 1990's  . TOTAL KNEE ARTHROPLASTY Left 08/02/2015   Procedure: LEFT TOTAL KNEE ARTHROPLASTY;  Surgeon: Paralee Cancel, MD;  Location: [facility];  Service: Orthopedics;  Laterality: Left;  . TOTAL SHOULDER REPLACEMENT Left 2006?  . TUBAL LIGATION  1952  . VIDEO ASSISTED THORACOSCOPY (VATS)/WEDGE RESECTION Right 09/28/2017   Procedure: RIGHT VIDEO ASSISTED THORACOSCOPY (VATS)/WEDGE RESECTION, LYMPH NODE DISSECTION ;  Surgeon: Melrose Nakayama, MD;  Location: [facility];  Service: Thoracic;  Laterality: Right;     OB History   No obstetric history on file.     Family History  Problem Relation Age of Onset  . Cancer Mother 85       Stomach  . Cancer Father 56       Lung cancer  . Bipolar disorder Sister     Social History   Tobacco  Use  . Smoking status: Former Smoker    Packs/day: 1.50    Years: 18.00    Pack years: 27.00    Types: Cigarettes    Quit date: 09/22/1985    Years since quitting: 34.6  . Smokeless tobacco: Never Used  Vaping Use  . Vaping Use: Never used  Substance Use Topics  . Alcohol use: No  . Drug use: No    Comment: prescribed    Home Medications Prior to Admission medications   Medication Sig Start Date End Date Taking? Authorizing Provider  acetaminophen (TYLENOL) 500 MG tablet Take 2 tablets (1,000 mg total) by mouth every 8 (eight) hours. 03/28/19   Danae Orleans, PA-C  amLODipine (NORVASC) 10 MG tablet Take 10 mg by mouth daily.    [provider]  buPROPion (WELLBUTRIN SR) 150 MG 12 hr tablet TAKE 1 TABLET BY MOUTH DAILY Patient taking differently:  Take 150 mg by mouth daily.  12/16/18   Forrest Moron, MD  diphenoxylate-atropine (LOMOTIL) 2.5-0.025 MG tablet Take 2 tablets by mouth 4 (four) times daily as needed for diarrhea or loose stools.  12/19/18   [provider]  escitalopram (LEXAPRO) 10 MG tablet Take 10 mg by mouth daily. 02/23/20   [provider]  ferrous sulfate (FERROUSUL) 325 (65 FE) MG tablet Take 1 tablet (325 mg total) by mouth 3 (three) times daily with meals. 03/28/19   Danae Orleans, PA-C  levothyroxine (SYNTHROID, LEVOTHROID) 100 MCG tablet TAKE 1 TABLET BY MOUTH BEFORE BREAKFAST MONDAY-FRIDAY SKIP SATURDAY ANS SUNDAY Patient taking differently: Take 100 mcg by mouth daily.  04/26/18   Forrest Moron, MD  methocarbamol (ROBAXIN) 500 MG tablet Take 500 mg by mouth 2 (two) times daily. 04/06/20   [provider]  morphine (MS CONTIN) 30 MG 12 hr tablet Take 1 tablet (30 mg total) by mouth every 12 (twelve) hours. 07/18/18   Kristeen Miss, MD  ondansetron (ZOFRAN) 4 MG tablet Take 4 mg by mouth 3 (three) times daily as needed for nausea/vomiting. 12/03/18   [provider]  oxyCODONE (OXY IR/ROXICODONE) 5 MG immediate release  tablet Take 1-2 tablets (5-10 mg total) by mouth every 4 (four) hours as needed for moderate pain or severe pain. Patient not taking: Reported on 05/13/2019 03/28/19   Danae Orleans, PA-C  pantoprazole (PROTONIX) 40 MG tablet Take 40 mg by mouth daily.  01/24/19   [provider]  polyethylene glycol (MIRALAX / GLYCOLAX) 17 g packet Take 17 g by mouth 2 (two) times daily. 03/28/19   Danae Orleans, PA-C  psyllium (REGULOID) 0.52 g capsule Take 0.52 g by mouth in the morning and at bedtime.    [provider]  vitamin B-12 (CYANOCOBALAMIN) 1000 MCG tablet Take 1 tablet by mouth daily.    [provider]  zolpidem (AMBIEN) 5 MG tablet Take 5 mg by mouth at bedtime.  12/02/18   [provider]    Allergies    Amoxicillin, Ampicillin, and Buspar [buspirone]  Review of Systems   Review of Systems  Constitutional: Negative for chills and fever.  HENT: Negative for ear pain and sore throat.   Eyes: Negative for pain and visual disturbance.  Respiratory: Negative for cough and shortness of breath.   Cardiovascular: Negative for chest pain and palpitations.  Gastrointestinal: Negative for abdominal pain and vomiting.  Genitourinary: Negative for dysuria and hematuria.  Musculoskeletal: Positive for arthralgias and gait problem. Negative for back pain.  Skin: Negative for color change and rash.  Neurological: Negative for seizures and syncope.  All other systems reviewed and are negative.   Physical Exam Updated Vital Signs  ED Triage Vitals  Enc Vitals Group     BP 05/23/20 2006 (!) 170/75     Pulse Rate 05/23/20 2006 76     Resp 05/23/20 2006 18     Temp 05/23/20 2006 99.2 F (37.3 C)     Temp Source 05/23/20 2006 Oral     SpO2 05/23/20 2006 96 %     Weight 05/23/20 2001 143 lb 15.4 oz (65.3 kg)     Height 05/23/20 2001 5\' 5"  (1.651 m)     Head Circumference --      Peak Flow --      Pain Score 05/23/20 2001 10     Pain Loc --      Pain Edu? --  Excl. in [place]? --     Physical Exam Vitals and nursing note reviewed.  Constitutional:      General: She is not in acute distress.    Appearance: She is well-developed.  HENT:     Head: Normocephalic and atraumatic.  Eyes:     Conjunctiva/sclera: Conjunctivae normal.  Cardiovascular:     Rate and Rhythm: Normal rate and regular rhythm.     Pulses: Normal pulses.     Heart sounds: No murmur heard.   Pulmonary:     Effort: Pulmonary effort is normal. No respiratory distress.     Breath sounds: Normal breath sounds.  Abdominal:     Palpations: Abdomen is soft.     Tenderness: There is no abdominal tenderness.  Musculoskeletal:        General: Tenderness (TTP to right hip that is shorten and rotated) present. Normal range of motion.     Cervical back: Neck supple.  Skin:    General: Skin is warm and dry.     Capillary Refill: Capillary refill takes less than 2 seconds.  Neurological:     General: No focal deficit present.     Mental Status: She is alert.     Sensory: No sensory deficit.     Motor: No weakness.     ED Results / Procedures / Treatments   Labs (all labs ordered are listed, but only abnormal results are displayed) Labs Reviewed - No data to display  EKG None  Radiology DG Hip Unilat  With Pelvis 2-3 Views Right  Result Date: 05/23/2020 CLINICAL DATA:  Right hip pain, felt pop EXAM: DG HIP (WITH OR WITHOUT PELVIS) 2-3V RIGHT COMPARISON:  Radiograph 04/22/2020 FINDINGS: Postsurgical changes from prior total right hip arthroplasty. Recurrent posterosuperior dislocation of the patient's total right hip arthroplasty. No other acute hardware complication is seen. Bones of the pelvis and proximal left femur are otherwise intact and congruent. Prior lumbosacral fusion hardware is also noted, unchanged from comparison with bony fusion across the lower lumbar levels and incorporation of L5-S1 spacer. Stable surgical clip in the left hemipelvis. Right hip swelling.  Remaining soft tissues are free of acute abnormality. IMPRESSION: 1. Recurrent posterosuperior dislocation of the patient's total right hip arthroplasty. Right hip swelling. 2. Additional postsurgical changes from lumbosacral fusion. Electronically Signed   By: Lovena Le M.D.   On: 05/23/2020 21:27    Procedures .Ortho Injury Treatment  Date/Time: 05/23/2020 11:11 PM Performed by: Lennice Sites, DO Authorized by: Lennice Sites, DO   Consent:    Consent obtained:  Written   Consent given by:  Patient   Risks discussed:  Fracture, irreducible dislocation, nerve damage, recurrent dislocation, restricted joint movement, stiffness and vascular damage   Alternatives discussed:  No treatmentInjury location: lower leg Location details: right lower leg Injury type: dislocation Pre-procedure neurovascular assessment: neurovascularly intact Pre-procedure distal perfusion: normal Pre-procedure neurological function: normal Pre-procedure range of motion: reduced  Anesthesia: Local anesthesia used: no  Patient sedated: Yes. Refer to sedation procedure documentation for details of sedation. Manipulation performed: yes Reduction successful: yes X-ray confirmed reduction: yes Immobilization: crutches and brace Splint type: knee immobilzer. Post-procedure neurovascular assessment: post-procedure neurovascularly intact Post-procedure distal perfusion: normal Post-procedure neurological function: normal Post-procedure range of motion: normal Patient tolerance: patient tolerated the procedure well with no immediate complications    (including critical care time)  Medications Ordered in ED Medications  fentaNYL (SUBLIMAZE) injection 50 mcg (50 mcg Intravenous Given 05/23/20 2017)  propofol (DIPRIVAN) 10 mg/mL bolus/IV  push (40 mg Intravenous Given 05/23/20 2242)  ketamine (KETALAR) injection (63.5 mg Intravenous Given 05/23/20 2244)  propofol (DIPRIVAN) 10 mg/mL bolus/IV push 65.3 mg (65.3 mg  Intravenous Given 05/23/20 2242)  ondansetron (ZOFRAN) injection 4 mg (4 mg Intravenous Given 05/23/20 2240)  sodium chloride 0.9 % bolus 500 mL (0 mLs Intravenous Stopped 05/23/20 2303)  fentaNYL (SUBLIMAZE) injection 50 mcg (50 mcg Intravenous Given 05/23/20 2128)  ketamine 50 mg in normal saline 5 mL (10 mg/mL) syringe (40 mg Intravenous Given 05/23/20 2244)    ED Course  I have reviewed the triage vital signs and the nursing notes.  Pertinent labs & imaging results that were available during my care of the patient were reviewed by me and considered in my medical decision making (see chart for details).    MDM Rules/Calculators/A&P                          Eesha R Propps is a 69 year old female with history of previous right hip injury status post right hip replacement.  Patient has had recurrent hip dislocations recently and occurred again tonight while bending over.  Unremarkable vitals.  No fever.  Right leg is shortened and rotated.  However neurovascularly intact.  X-ray confirmed posterior right hip dislocation.  Patient was given sedation by Dr. Kathrynn Humble and I was able to perform reduction of right hip dislocation successfully.  Confirmed with x-ray.  Patient was placed in a knee immobilizer.  Patient tolerated sedation well.  Patient was handed off to oncoming ED staff with patient pending clearance from sedation.  Will follow up with orthopedics.  Neurovascularly intact after reduction.  This chart was dictated using voice recognition software.  Despite best efforts to proofread,  errors can occur which can change the documentation meaning.    Final Clinical Impression(s) / ED Diagnoses Final diagnoses:  Dislocation of right hip, initial encounter [facility])    Rx / DC Orders ED Discharge Orders    None       Lennice Sites, DO 05/23/20 [place], [place], DO 05/23/20 2316

## 2020-05-23 NOTE — ED Notes (Addendum)
Paper consent signed & at bedside. Electronic consent filed

## 2020-05-23 NOTE — Progress Notes (Signed)
Orthopedic Tech Progress Note Patient Details:  [name] Nov 10, 1950 [id number]  Ortho Devices Type of Ortho Device: Knee Immobilizer Ortho Device/Splint Location: rle. applied post reduction. Ortho Device/Splint Interventions: Ordered, Application, Adjustment   Post Interventions Patient Tolerated: Well Instructions Provided: Care of device, Adjustment of device   Karolee Stamps 05/23/2020, 10:58 PM

## 2020-05-23 NOTE — ED Triage Notes (Signed)
BIB [facility] from home. Patient bent over and "felt her hip pop out" Shortening noted to R leg. VSS.

## 2020-05-23 NOTE — ED Provider Notes (Signed)
  Physical Exam  BP (!) 167/70 (BP Location: Right Arm)   Pulse 83   Temp 98.9 F (37.2 C) (Oral)   Resp 21   Ht 5\' 5"  (1.651 m)   Wt 63.5 kg   SpO2 100%   BMI 23.30 kg/m   Physical Exam  ED Course/Procedures     .Sedation  Date/Time: 05/23/2020 10:40 PM Performed by: Varney Biles, MD Authorized by: Varney Biles, MD   Consent:    Consent obtained:  Written   Consent given by:  Patient   Risks discussed:  Allergic reaction, dysrhythmia, inadequate sedation, nausea, prolonged hypoxia resulting in organ damage, prolonged sedation necessitating reversal, respiratory compromise necessitating ventilatory assistance and intubation and vomiting   Alternatives discussed:  Analgesia without sedation, anxiolysis and regional anesthesia Universal protocol:    Procedure explained and questions answered to patient or proxy's satisfaction: yes     Relevant documents present and verified: yes     Test results available and properly labeled: yes     Imaging studies available: yes     Required blood products, implants, devices, and special equipment available: yes     Site/side marked: yes     Immediately prior to procedure a time out was called: yes     Patient identity confirmation method:  Verbally with patient Indications:    Procedure performed:  Fracture reduction   Procedure necessitating sedation performed by:  Physician performing sedation Pre-sedation assessment:    Time since last food or drink:  6 hours   ASA classification: class 3 - patient with severe systemic disease     Neck mobility: normal     Mouth opening:  3 or more finger widths   Thyromental distance:  4 finger widths   Mallampati score:  I - soft palate, uvula, fauces, pillars visible   Pre-sedation assessments completed and reviewed: airway patency, cardiovascular function, hydration status, mental status, nausea/vomiting, pain level, respiratory function and temperature     Pre-sedation assessment  completed:  05/23/2020 10:24 PM Immediate pre-procedure details:    Reassessment: Patient reassessed immediately prior to procedure     Reviewed: vital signs, relevant labs/tests and NPO status     Verified: bag valve mask available, emergency equipment available, intubation equipment available, IV patency confirmed, oxygen available and suction available   Procedure details (see MAR for exact dosages):    Preoxygenation:  Nasal cannula   Sedation:  Propofol   Intended level of sedation: deep   Intra-procedure monitoring:  Blood pressure monitoring, cardiac monitor, continuous pulse oximetry, frequent LOC assessments, frequent vital sign checks and continuous capnometry   Intra-procedure events: none     Total Provider sedation time (minutes):  24 Post-procedure details:    Post-sedation assessment completed:  05/23/2020 11:25 PM   Attendance: Constant attendance by certified staff until patient recovered     Recovery: Patient returned to pre-procedure baseline     Post-sedation assessments completed and reviewed: airway patency, cardiovascular function, hydration status, mental status, nausea/vomiting, pain level, respiratory function and temperature     Patient is stable for discharge or admission: yes     Patient tolerance:  Tolerated well, no immediate complications    MDM   Patient was sedated for hip reduction. No complications.11:26 PM - reassessed - doing well.    Varney Biles, MD 05/23/20 [phone]

## 2020-05-23 NOTE — Discharge Instructions (Signed)
Continue to wear knee immobilizer do not flex or extend at the hip and follow-up with orthopedics recommend Tylenol Motrin as needed for pain.  Make appointment with orthopedics to follow-up.

## 2020-05-24 DIAGNOSIS — S73004A Unspecified dislocation of right hip, initial encounter: Secondary | ICD-10-CM | POA: Diagnosis not present

## 2020-05-24 NOTE — ED Notes (Signed)
Patient verbalizes understanding of discharge instructions. Opportunity for questioning and answers were provided. Armband removed by staff, pt discharged from ED stable & ambulatory  

## 2020-05-24 NOTE — Progress Notes (Signed)
Orthopedic Tech Progress Note Patient Details:  [name] 1951-09-08 [id number]  Ortho Devices Type of Ortho Device: Crutches Ortho Device/Splint Location: rle. applied post reduction. Ortho Device/Splint Interventions: Ordered, Application, Adjustment   Post Interventions Patient Tolerated: Well Instructions Provided: Care of device, Adjustment of device   Karolee Stamps 05/24/2020, 12:46 AM

## 2020-05-24 NOTE — ED Notes (Signed)
Patient already DC when attempting to waste Fentanyl in Pyxis. 80mcg wasted with Noralyn Pick RN

## 2020-05-24 NOTE — ED Provider Notes (Signed)
Care assumed from Dr. Ronnald Nian at shift change.  Patient here with dislocation of her hip prosthesis.  This was reduced by Dr. Ronnald Nian under conscious sedation.  Patient signed out to me awaiting metabolism of her sedative medications.  Patient has been reevaluated and is awake, alert, and requesting to go home.  Patient seems suitable for discharge.   Veryl Speak, MD 05/24/20 [phone]

## 2020-07-29 NOTE — H&P (Signed)
TOTAL HIP REVISION ADMISSION H&P  Patient is admitted for right revision total hip arthroplasty to a constrained liner.  Subjective:  Chief Complaint: Right hip instability s/p TH revision  HPI: [name], 69 y.o. female, has a history of pain and functional disability in the right hip due to instability and dislocations and patient has failed non-surgical conservative treatments for greater than 12 weeks to include NSAID's and/or analgesics, use of assistive devices and activity modification. The indications for the revision total hip arthroplasty are multiple dislocations.  Onset of symptoms was abrupt starting 2 years ago with stable course since that time.  Prior procedures on the right hip include arthroplasty and revision.  Patient currently rates pain in the right hip at 7 out of 10 with activity.  There is worsening of pain with activity and weight bearing, trendelenberg gait, pain that interfers with activities of daily living and pain with passive range of motion. Patient has evidence of previous THA by imaging studies.  This condition presents safety issues increasing the risk of falls.   There is no current active infection.  Risks, benefits and expectations were discussed with the patient.  Risks including but not limited to the risk of anesthesia, blood clots, nerve damage, blood vessel damage, failure of the prosthesis, infection and up to and including death.  Patient understand the risks, benefits and expectations and wishes to proceed with surgery.   PCP: Bernerd Limbo, MD  D/C Plans:       Home  Post-op Meds:       No Rx given   Tranexamic Acid:      To be given - IV   Decadron:      Is to be given  FYI:      ASA  Morphine  DME:   Pt already has equipment   PT:   HEP    Patient Active Problem List   Diagnosis Date Noted  . S/P right TH revision 03/27/2019  . 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 Closed displaced fracture of right  femoral neck ([place]) 02/17/2019  . Leukocytosis 02/17/2019  . Normocytic normochromic anemia 02/17/2019  . Essential hypertension 02/17/2019  . Malnutrition of moderate degree 12/06/2018  . Pressure injury of skin 12/05/2018  . Hypomagnesemia 12/04/2018  . Dehydration 12/04/2018  . Weight loss 12/04/2018  . Colitis 12/04/2018  . 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 Benzodiazepine withdrawal without complication ([place]) 50/06/3817  . Hypokalemia 03/13/2018  . Acute on chronic renal insufficiency 03/13/2018  . Generalized anxiety disorder 03/13/2018  . Acute cognitive decline 03/13/2018  . Adenocarcinoma of right lung, stage 1 ([place]) 10/22/2017  . Chronic pain syndrome 07/25/2017  . S/P left TKA 08/02/2015  . S/P knee replacement 08/02/2015  . Carpal tunnel syndrome 04/23/2015  . Autoimmune disease ([place]) 03/29/2013  . Apnea, sleep 03/29/2013  . Anxiety 11/28/2012  . Seizure disorder ([place]) 11/28/2012  . Anxiety state 11/28/2012  . History of pericarditis, with pericardial window in 2009, and recurrent episodes 2011,2013, treated with methotrexate 11/27/2012  . SOB (shortness of breath) 11/27/2012  . Hypothyroidism 11/27/2012  . History of partial nephrectomy, both rt. and lt 11/27/2012  . Hx of ovarian cancer 11/27/2012  . Venous insufficiency 11/27/2012  . Chronic back pain, hx of lumbar spondylosis and stenosis L5-S1 with hx decompression and total diskectomy 11/27/2012  . Hx of melanoma of skin 11/27/2012  . 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 Chronic venous insufficiency  11/27/2012  . DIARRHEA-PRESUMED INFECTIOUS 12/27/2009   Past Medical History:  Diagnosis Date  . Anemia   . Anxiety   . Arthritis    "everywhere" (08/19/2013)  . Bilateral carpal tunnel syndrome   . Chest pain at rest, rule out pericarditis 11/27/2012  . CHF (congestive heart failure) ([place])   . Chronic kidney disease    dr Risa Grill  . Chronic lower back pain   . Closed  displaced fracture of right femoral neck ([place]) 02/18/2019  . Complication of anesthesia    "woke up twice during knee replacement" (08/19/2013)  . Depression   . Dyspnea    w/ exertion    . Fibromyalgia   . Headache(784.0)    hx of migraines   . History of pericarditis, with pericardial window in 2009 11/27/2012  . Hx of ovarian cancer 11/27/2012  . Hypothyroidism 11/27/2012  . Iron deficiency anemia   . Kidney carcinoma ([place])    "both kidneys; ~ 3 yr apart" (08/19/2013)  . Lung cancer ([place]) 2018  . Melanoma of back ([place])   . Migraines   . Pneumonia    "several times; including today", RUL/notes 08/19/2013  . Squamous carcinoma    "face, side of my nose, chest; used acid to get rid of them" (08/19/2013)  . Venous insufficiency 11/27/2012    Past Surgical History:  Procedure Laterality Date  . ABDOMINAL HYSTERECTOMY  1979  . ANTERIOR LAT LUMBAR FUSION Left 07/16/2018   Procedure: Left Lumbar two-three  Anterolateral decompression/interbody fusion with lateral plate fixation;  Surgeon: Kristeen Miss, MD;  Location: [facility];  Service: Neurosurgery;  Laterality: Left;  . APPENDECTOMY    . DOPPLER ECHOCARDIOGRAPHY  02/23/2011   LVEF>50%  . FRACTURE SURGERY    . KNEE ARTHROSCOPY Right    "twice before the replacement" (08/19/2013)  . LEFT OOPHORECTOMY Left 1979  . [place]   "ruptured disc"  . MELANOMA EXCISION  ~ 2010   "my lower back" (08/19/2013)  . ORIF HIP FRACTURE Left    x 2 age 63 and 4  . PARTIAL NEPHRECTOMY Right 2005; ~ 2008  . PERICARDIAL WINDOW  ~ 2012  . POSTERIOR LUMBAR FUSION  2002?; 03/2012  . REDUCTION MAMMAPLASTY Bilateral ~ 2008  . RIGHT OOPHORECTOMY  ~ 2007   "it had cancer in it" (08/19/2013)  . SHOULDER ARTHROSCOPY W/ ROTATOR CUFF REPAIR Right 1990's  . TONSILLECTOMY  1954  . TOTAL HIP ARTHROPLASTY Right 02/18/2019   Procedure: TOTAL HIP ARTHROPLASTY;  Surgeon: Marchia Bond, MD;  Location: [facility];  Service: Orthopedics;  Laterality: Right;  .  TOTAL HIP REVISION Right 03/27/2019   Procedure: TOTAL HIP REVISION;  Surgeon: Paralee Cancel, MD;  Location: [facility];  Service: Orthopedics;  Laterality: Right;  . TOTAL KNEE ARTHROPLASTY Right 1990's  . TOTAL KNEE ARTHROPLASTY Left 08/02/2015   Procedure: LEFT TOTAL KNEE ARTHROPLASTY;  Surgeon: Paralee Cancel, MD;  Location: [facility];  Service: Orthopedics;  Laterality: Left;  . TOTAL SHOULDER REPLACEMENT Left 2006?  . TUBAL LIGATION  1952  . VIDEO ASSISTED THORACOSCOPY (VATS)/WEDGE RESECTION Right 09/28/2017   Procedure: RIGHT VIDEO ASSISTED THORACOSCOPY (VATS)/WEDGE RESECTION, LYMPH NODE DISSECTION ;  Surgeon: Melrose Nakayama, MD;  Location: [facility];  Service: Thoracic;  Laterality: Right;    No current facility-administered medications for this encounter.   Current Outpatient Medications  Medication Sig Dispense Refill Last Dose  . acetaminophen (TYLENOL) 500 MG tablet Take 2 tablets (1,000 mg total) by mouth every 8 (eight)  hours. (Patient not taking: Reported on 05/24/2020) 30 tablet 0   . buPROPion (WELLBUTRIN SR) 150 MG 12 hr tablet TAKE 1 TABLET BY MOUTH DAILY (Patient taking differently: Take 150 mg by mouth daily. ) 30 tablet 0   . diphenoxylate-atropine (LOMOTIL) 2.5-0.025 MG tablet Take 2 tablets by mouth 4 (four) times daily as needed for diarrhea or loose stools.      Marland Kitchen escitalopram (LEXAPRO) 10 MG tablet Take 10 mg by mouth daily.     . ferrous sulfate (FERROUSUL) 325 (65 FE) MG tablet Take 1 tablet (325 mg total) by mouth 3 (three) times daily with meals. (Patient taking differently: Take 325 mg by mouth daily with breakfast. )  3   . levothyroxine (SYNTHROID, LEVOTHROID) 100 MCG tablet TAKE 1 TABLET BY MOUTH BEFORE BREAKFAST MONDAY-FRIDAY SKIP SATURDAY ANS SUNDAY (Patient taking differently: Take 100 mcg by mouth daily. ) 30 tablet 3   . morphine (MS CONTIN) 30 MG 12 hr tablet Take 1 tablet (30 mg total) by mouth every 12 (twelve) hours. 60 tablet 0   . ondansetron (ZOFRAN) 4 MG  tablet Take 4 mg by mouth 3 (three) times daily as needed for nausea/vomiting.     Marland Kitchen oxyCODONE (OXY IR/ROXICODONE) 5 MG immediate release tablet Take 1-2 tablets (5-10 mg total) by mouth every 4 (four) hours as needed for moderate pain or severe pain. (Patient not taking: Reported on 05/13/2019) 60 tablet 0   . pantoprazole (PROTONIX) 40 MG tablet Take 40 mg by mouth daily.      . polyethylene glycol (MIRALAX / GLYCOLAX) 17 g packet Take 17 g by mouth 2 (two) times daily. (Patient taking differently: Take 17 g by mouth daily as needed for mild constipation. ) 14 each 0   . psyllium (METAMUCIL) 58.6 % packet Take 1 packet by mouth daily.     . vitamin B-12 (CYANOCOBALAMIN) 1000 MCG tablet Take 1 tablet by mouth daily.     Marland Kitchen zolpidem (AMBIEN) 5 MG tablet Take 5 mg by mouth at bedtime.       Allergies  Allergen Reactions  . Amoxicillin Other (See Comments)    UNSPECIFIED REACTION  Has patient had a PCN reaction causing immediate rash, facial/tongue/throat swelling, SOB or lightheadedness with hypotension: No Has patient had a PCN reaction causing severe rash involving mucus membranes or skin necrosis: No Has patient had a PCN reaction that required hospitalization No Has patient had a PCN reaction occurring within the last 10 years: No If all of the above answers are "NO", then may proceed with Cephalosporin use.  . Ampicillin Rash and Other (See Comments)    Has patient had a PCN reaction causing immediate rash, facial/tongue/throat swelling, SOB or lightheadedness with hypotension: No Has patient had a PCN reaction causing severe rash involving mucus membranes or skin necrosis: No Has patient had a PCN reaction that required hospitalization No Has patient had a PCN reaction occurring within the last 10 years: No If all of the above answers are "NO", then may proceed with Cephalosporin use.    Social History   Tobacco Use  . Smoking status: Former Smoker    Packs/day: 1.50    Years: 18.00     Pack years: 27.00    Types: Cigarettes    Quit date: 09/22/1985    Years since quitting: 34.8  . Smokeless tobacco: Never Used  Substance Use Topics  . Alcohol use: No    Family History  Problem Relation Age of Onset  .  Cancer Mother 62       Stomach  . Cancer Father 26       Lung cancer  . Bipolar disorder Sister      Review of Systems  Constitutional: Negative.   HENT: Negative.   Eyes: Negative.   Respiratory: Negative.   Cardiovascular: Negative.   Gastrointestinal: Negative.   Genitourinary: Negative.   Musculoskeletal: Positive for joint pain.  Skin: Negative.   Neurological: Positive for headaches.  Endo/Heme/Allergies: Negative.   Psychiatric/Behavioral: The patient is nervous/anxious.       Objective:  Physical Exam Constitutional:      Appearance: She is well-developed.  HENT:     Head: Normocephalic.  Eyes:     Pupils: Pupils are equal, round, and reactive to light.  Neck:     Thyroid: No thyromegaly.     Vascular: No JVD.     Trachea: No tracheal deviation.  Cardiovascular:     Rate and Rhythm: Normal rate and regular rhythm.  Pulmonary:     Effort: Pulmonary effort is normal. No respiratory distress.     Breath sounds: Normal breath sounds. No wheezing.  Abdominal:     Palpations: Abdomen is soft.     Tenderness: There is no abdominal tenderness. There is no guarding.  Musculoskeletal:     Cervical back: Neck supple.     Right hip: Tenderness and bony tenderness present. Decreased range of motion. Decreased strength.  Lymphadenopathy:     Cervical: No cervical adenopathy.  Skin:    General: Skin is warm and dry.  Neurological:     Mental Status: She is alert and oriented to person, place, and time.       Labs:   Estimated body mass index is 23.3 kg/m as calculated from the following:   Height as of 05/23/20: 5\' 5"  (1.651 m).   Weight as of 05/23/20: 63.5 kg.  Imaging Review:  Plain radiographs demonstrate previous THA of the  right hip. The bone quality appears to be good for age and reported activity level.     Assessment/Plan:  Right hip with failed previous arthroplasty due to instability and dislocations  The patient history, physical examination, clinical judgement of the provider and imaging studies are consistent with instability of the right hip, previous total hip arthroplasty. Revision total hip arthroplasty with a constrained liner is deemed medically necessary. The treatment options including medical management, injection therapy, arthroscopy and arthroplasty were discussed at length. The risks and benefits of total hip arthroplasty were presented and reviewed. The risks due to aseptic loosening, infection, stiffness, dislocation/subluxation,  thromboembolic complications and other imponderables were discussed.  The patient acknowledged the explanation, agreed to proceed with the plan and consent was signed. Patient is being admitted for inpatient treatment for surgery, pain control, PT, OT, prophylactic antibiotics, VTE prophylaxis, progressive ambulation and ADL's and discharge planning. The patient is planning to be discharged home.

## 2020-08-05 NOTE — Patient Instructions (Addendum)
DUE TO COVID-19 ONLY ONE VISITOR IS ALLOWED TO COME WITH YOU AND STAY IN THE WAITING ROOM ONLY DURING PRE OP AND PROCEDURE DAY OF SURGERY. THE 1 VISITOR  MAY VISIT WITH YOU AFTER SURGERY IN YOUR PRIVATE ROOM DURING VISITING HOURS ONLY!  YOU NEED TO HAVE A COVID 19 TEST ON: 08/09/20 @ 12:45 PM, THIS TEST MUST BE DONE BEFORE SURGERY,  COVID TESTING SITE [place] [address], IT IS ON THE RIGHT GOING OUT [address] APPROXIMATELY  2 MINUTES PAST [employer] ON THE RIGHT. ONCE YOUR COVID TEST IS COMPLETED,  PLEASE BEGIN THE QUARANTINE INSTRUCTIONS AS OUTLINED IN YOUR HANDOUT.                Zanita R Hosein    Your procedure is scheduled on: 08/12/20   Report to [facility] Main  Entrance   Report to short stay at: 5:30 AM     Call this number if you have problems the morning of surgery [phone]    Remember:   NO SOLID FOOD AFTER MIDNIGHT THE NIGHT PRIOR TO SURGERY. NOTHING BY MOUTH EXCEPT CLEAR LIQUIDS UNTIL: 4:15 AM . PLEASE FINISH ENSURE DRINK PER SURGEON ORDER  WHICH NEEDS TO BE COMPLETED AT: 4:15 AM .  CLEAR LIQUID DIET   Foods Allowed                                                                     Foods Excluded  Coffee and tea, regular and decaf                             liquids that you cannot  Plain Jell-O any favor except red or purple                                           see through such as: Fruit ices (not with fruit pulp)                                     milk, soups, orange juice  Iced Popsicles                                    All solid food Carbonated beverages, regular and diet                                    Cranberry, grape and apple juices Sports drinks like Gatorade Lightly seasoned clear broth or consume(fat free) Sugar, honey syrup  Sample Menu Breakfast                                Lunch  Supper Cranberry juice                    Beef broth                             Chicken broth Jell-O                                     Grape juice                           Apple juice Coffee or tea                        Jell-O                                      Popsicle                                                Coffee or tea                        Coffee or tea  _____________________________________________________________________   BRUSH YOUR TEETH MORNING OF SURGERY AND RINSE YOUR MOUTH OUT, NO CHEWING GUM CANDY OR MINTS.     Take these medicines the morning of surgery with A SIP OF WATER: bupropion,synthroid,escitalopram,pantoprazole.                                You may not have any metal on your body including hair pins and              piercings  Do not wear jewelry, make-up, lotions, powders or perfumes, deodorant             Do not wear nail polish on your fingernails.  Do not shave  48 hours prior to surgery.    Do not bring valuables to the hospital. [place].  Contacts, dentures or bridgework may not be worn into surgery.  Leave suitcase in the car. After surgery it may be brought to your room.     Patients discharged the day of surgery will not be allowed to drive home. IF YOU ARE HAVING SURGERY AND GOING HOME THE SAME DAY, YOU MUST HAVE AN ADULT TO DRIVE YOU HOME AND BE WITH YOU FOR 24 HOURS. YOU MAY GO HOME BY TAXI OR UBER OR ORTHERWISE, BUT AN ADULT MUST ACCOMPANY YOU HOME AND STAY WITH YOU FOR 24 HOURS.  Name and phone number of your driver:  Special Instructions: N/A              Please read over the following fact sheets you were given: _____________________________________________________________________         [facility] - Preparing for Surgery Before surgery, you can play an important role.  Because skin is not sterile, your skin needs to be as free of germs as possible.  You can reduce the number of germs on your skin by washing with CHG (chlorahexidine gluconate) soap  before surgery.  CHG is an antiseptic cleaner which kills germs and bonds with the skin to continue killing germs even after washing. Please DO NOT use if you have an allergy to CHG or antibacterial soaps.  If your skin becomes reddened/irritated stop using the CHG and inform your nurse when you arrive at Short Stay. Do not shave (including legs and underarms) for at least 48 hours prior to the first CHG shower.  You may shave your face/neck. Please follow these instructions carefully:  1.  Shower with CHG Soap the night before surgery and the  morning of Surgery.  2.  If you choose to wash your hair, wash your hair first as usual with your  normal  shampoo.  3.  After you shampoo, rinse your hair and body thoroughly to remove the  shampoo.                           4.  Use CHG as you would any other liquid soap.  You can apply chg directly  to the skin and wash                       Gently with a scrungie or clean washcloth.  5.  Apply the CHG Soap to your body ONLY FROM THE NECK DOWN.   Do not use on face/ open                           Wound or open sores. Avoid contact with eyes, ears mouth and genitals (private parts).                       Wash face,  Genitals (private parts) with your normal soap.             6.  Wash thoroughly, paying special attention to the area where your surgery  will be performed.  7.  Thoroughly rinse your body with warm water from the neck down.  8.  DO NOT shower/wash with your normal soap after using and rinsing off  the CHG Soap.                9.  Pat yourself dry with a clean towel.            10.  Wear clean pajamas.            11.  Place clean sheets on your bed the night of your first shower and do not  sleep with pets. Day of Surgery : Do not apply any lotions/deodorants the morning of surgery.  Please wear clean clothes to the hospital/surgery center.  FAILURE TO FOLLOW THESE INSTRUCTIONS MAY RESULT IN THE CANCELLATION OF YOUR SURGERY PATIENT  SIGNATURE_________________________________  NURSE SIGNATURE__________________________________  ________________________________________________________________________   Adam Phenix  An incentive spirometer is a tool that can help keep your lungs clear and active. This tool measures how well you are filling your lungs with each breath. Taking long deep breaths may help reverse or decrease the chance of developing breathing (pulmonary) problems (especially infection) following:  A long period of time when you are unable to move or be active. BEFORE THE PROCEDURE   If the spirometer includes an indicator to show your best effort, your nurse or respiratory therapist will  set it to a desired goal.  If possible, sit up straight or lean slightly forward. Try not to slouch.  Hold the incentive spirometer in an upright position. INSTRUCTIONS FOR USE  1. Sit on the edge of your bed if possible, or sit up as far as you can in bed or on a chair. 2. Hold the incentive spirometer in an upright position. 3. Breathe out normally. 4. Place the mouthpiece in your mouth and seal your lips tightly around it. 5. Breathe in slowly and as deeply as possible, raising the piston or the ball toward the top of the column. 6. Hold your breath for 3-5 seconds or for as long as possible. Allow the piston or ball to fall to the bottom of the column. 7. Remove the mouthpiece from your mouth and breathe out normally. 8. Rest for a few seconds and repeat Steps 1 through 7 at least 10 times every 1-2 hours when you are awake. Take your time and take a few normal breaths between deep breaths. 9. The spirometer may include an indicator to show your best effort. Use the indicator as a goal to work toward during each repetition. 10. After each set of 10 deep breaths, practice coughing to be sure your lungs are clear. If you have an incision (the cut made at the time of surgery), support your incision when coughing  by placing a pillow or rolled up towels firmly against it. Once you are able to get out of bed, walk around indoors and cough well. You may stop using the incentive spirometer when instructed by your caregiver.  RISKS AND COMPLICATIONS  Take your time so you do not get dizzy or light-headed.  If you are in pain, you may need to take or ask for pain medication before doing incentive spirometry. It is harder to take a deep breath if you are having pain. AFTER USE  Rest and breathe slowly and easily.  It can be helpful to keep track of a log of your progress. Your caregiver can provide you with a simple table to help with this. If you are using the spirometer at home, follow these instructions: [place] IF:   You are having difficultly using the spirometer.  You have trouble using the spirometer as often as instructed.  Your pain medication is not giving enough relief while using the spirometer.  You develop fever of 100.5 F (38.1 C) or higher. SEEK IMMEDIATE MEDICAL CARE IF:   You cough up bloody sputum that had not been present before.  You develop fever of 102 F (38.9 C) or greater.  You develop worsening pain at or near the incision site. MAKE SURE YOU:   Understand these instructions.  Will watch your condition.  Will get help right away if you are not doing well or get worse. Document Released: 02/19/2007 Document Revised: 01/01/2012 Document Reviewed: 04/22/2007 [facility] Patient Information [address], [place].   ________________________________________________________________________

## 2020-08-09 ENCOUNTER — Encounter (HOSPITAL_COMMUNITY): Payer: Self-pay

## 2020-08-09 ENCOUNTER — Encounter (HOSPITAL_COMMUNITY)
Admission: RE | Admit: 2020-08-09 | Discharge: 2020-08-09 | Disposition: A | Discharge status: Home / Self Care | Payer: Medicare Other | Source: Ambulatory Visit | Attending: Orthopedic Surgery | Admitting: Orthopedic Surgery

## 2020-08-09 ENCOUNTER — Other Ambulatory Visit: Payer: Self-pay

## 2020-08-09 ENCOUNTER — Other Ambulatory Visit (HOSPITAL_COMMUNITY)
Admission: RE | Admit: 2020-08-09 | Discharge: 2020-08-09 | Disposition: A | Discharge status: Home / Self Care | Payer: Medicare Other | Source: Ambulatory Visit | Attending: Orthopedic Surgery | Admitting: Orthopedic Surgery

## 2020-08-09 DIAGNOSIS — Z01818 Encounter for other preprocedural examination: Secondary | ICD-10-CM | POA: Diagnosis present

## 2020-08-09 DIAGNOSIS — Z20822 Contact with and (suspected) exposure to covid-19: Secondary | ICD-10-CM | POA: Insufficient documentation

## 2020-08-09 LAB — CBC
HCT: 37.3 % (ref 36.0–46.0)
Hemoglobin: 12.1 g/dL (ref 12.0–15.0)
MCH: 30.3 pg (ref 26.0–34.0)
MCHC: 32.4 g/dL (ref 30.0–36.0)
MCV: 93.5 fL (ref 80.0–100.0)
Platelets: 185 10*3/uL (ref 150–400)
RBC: 3.99 MIL/uL (ref 3.87–5.11)
RDW: 12.8 % (ref 11.5–15.5)
WBC: 7.6 10*3/uL (ref 4.0–10.5)
nRBC: 0 % (ref 0.0–0.2)

## 2020-08-09 LAB — BASIC METABOLIC PANEL
Anion gap: 10 (ref 5–15)
BUN: 29 mg/dL — ABNORMAL HIGH (ref 8–23)
CO2: 25 mmol/L (ref 22–32)
Calcium: 10.1 mg/dL (ref 8.9–10.3)
Chloride: 101 mmol/L (ref 98–111)
Creatinine, Ser: 1.55 mg/dL — ABNORMAL HIGH (ref 0.44–1.00)
GFR, Estimated: 34 mL/min — ABNORMAL LOW (ref >60)
Glucose, Bld: 118 mg/dL — ABNORMAL HIGH (ref 70–99)
Potassium: 4.9 mmol/L (ref 3.5–5.1)
Sodium: 136 mmol/L (ref 135–145)

## 2020-08-09 LAB — SARS CORONAVIRUS 2 (TAT 6-24 HRS): SARS Coronavirus 2: NEGATIVE

## 2020-08-09 LAB — SURGICAL PCR SCREEN
MRSA, PCR: NEGATIVE
Staphylococcus aureus: POSITIVE — AB

## 2020-08-09 NOTE — Progress Notes (Signed)
COVID Vaccine Completed: NO Date COVID Vaccine completed: COVID vaccine manufacturer: [employer]   PCP - Dr. Bernerd Limbo. Cardiologist - NO  Chest x-ray -  EKG -  Stress Test -  ECHO -  Cardiac Cath -  Pacemaker/ICD device last checked:  Sleep Study -  CPAP -   Fasting Blood Sugar -  Checks Blood Sugar _____ times a day  Blood Thinner Instructions: Aspirin Instructions: Last Dose:  Anesthesia review: Hx: pericarditis,pericardial window(2009). Pt. Is independent,unable to walk long distances due to the hip pain and weakness on bil. Legs.She denies any SOB.  Patient denies shortness of breath, fever, cough and chest pain at PAT appointment   Patient verbalized understanding of instructions that were given to them at the PAT appointment. Patient was also instructed that they will need to review over the PAT instructions again at home before surgery.

## 2020-08-10 NOTE — Progress Notes (Signed)
PCR:  positive [facility]

## 2020-08-11 ENCOUNTER — Encounter (HOSPITAL_COMMUNITY): Payer: Self-pay | Admitting: Orthopedic Surgery

## 2020-08-11 NOTE — Anesthesia Preprocedure Evaluation (Addendum)
Anesthesia Evaluation  Patient identified by MRN, date of birth, ID band Patient awake    Reviewed: Allergy & Precautions, NPO status , Patient's Chart, lab work & pertinent test results  History of Anesthesia Complications (+) history of anesthetic complications  Airway Mallampati: II  TM Distance: >3 FB Neck ROM: Full    Dental  (+) Dental Advisory Given, Upper Dentures, Caps   Pulmonary shortness of breath and with exertion, sleep apnea , pneumonia, resolved, former smoker,    Pulmonary exam normal breath sounds clear to auscultation       Cardiovascular hypertension, Pt. on medications +CHF  Normal cardiovascular exam Rhythm:Regular Rate:Normal  EKG 08/09/20 Sinus Tachycardia with PAC's   Neuro/Psych  Headaches, Seizures -, Well Controlled,  PSYCHIATRIC DISORDERS Anxiety Depression  Neuromuscular disease    GI/Hepatic Neg liver ROS, GERD  Medicated and Controlled,  Endo/Other  Hypothyroidism   Renal/GU Renal disease  negative genitourinary   Musculoskeletal  (+) Arthritis , Osteoarthritis,  Fibromyalgia -Recurrent dislocation right THR Hx/o 4 back surgeries including 3 fusions   Abdominal   Peds  Hematology  (+) anemia ,   Anesthesia Other Findings FAILED RIGHT HIP W/INSTABILITY  Reproductive/Obstetrics                         Anesthesia Physical  Anesthesia Plan  ASA: III  Anesthesia Plan: Spinal   Post-op Pain Management:    Induction: Intravenous  PONV Risk Score and Plan: 3 and Ondansetron, Dexamethasone, Midazolam and Treatment may vary due to age or medical condition  Airway Management Planned: Natural Airway, Simple Face Mask and Nasal Cannula  Additional Equipment:   Intra-op Plan:   Post-operative Plan:   Informed Consent: I have reviewed the patients History and Physical, chart, labs and discussed the procedure including the risks, benefits and alternatives for  the proposed anesthesia with the patient or authorized representative who has indicated his/her understanding and acceptance.     Dental advisory given  Plan Discussed with: CRNA and Anesthesiologist  Anesthesia Plan Comments:       Anesthesia Quick Evaluation

## 2020-08-12 ENCOUNTER — Encounter (HOSPITAL_COMMUNITY)
Admission: RE | Disposition: A | Discharge status: Home / Self Care | Payer: Self-pay | Source: Ambulatory Visit | Attending: Orthopedic Surgery

## 2020-08-12 ENCOUNTER — Encounter (HOSPITAL_COMMUNITY): Payer: Self-pay | Admitting: Orthopedic Surgery

## 2020-08-12 ENCOUNTER — Observation Stay (HOSPITAL_COMMUNITY): Payer: Medicare Other

## 2020-08-12 ENCOUNTER — Ambulatory Visit (HOSPITAL_COMMUNITY): Payer: Medicare Other | Admitting: Certified Registered"

## 2020-08-12 ENCOUNTER — Observation Stay (HOSPITAL_COMMUNITY)
Admission: RE | Admit: 2020-08-12 | Discharge: 2020-08-13 | Disposition: A | Discharge status: Home / Self Care | Payer: Medicare Other | Source: Ambulatory Visit | Attending: Orthopedic Surgery | Admitting: Orthopedic Surgery

## 2020-08-12 ENCOUNTER — Other Ambulatory Visit: Payer: Self-pay

## 2020-08-12 ENCOUNTER — Ambulatory Visit (HOSPITAL_COMMUNITY): Payer: Medicare Other | Admitting: Physician Assistant

## 2020-08-12 DIAGNOSIS — E039 Hypothyroidism, unspecified: Secondary | ICD-10-CM | POA: Insufficient documentation

## 2020-08-12 DIAGNOSIS — I130 Hypertensive heart and chronic kidney disease with heart failure and stage 1 through stage 4 chronic kidney disease, or unspecified chronic kidney disease: Secondary | ICD-10-CM | POA: Insufficient documentation

## 2020-08-12 DIAGNOSIS — M1990 Unspecified osteoarthritis, unspecified site: Secondary | ICD-10-CM | POA: Diagnosis not present

## 2020-08-12 DIAGNOSIS — Z87891 Personal history of nicotine dependence: Secondary | ICD-10-CM | POA: Insufficient documentation

## 2020-08-12 DIAGNOSIS — N189 Chronic kidney disease, unspecified: Secondary | ICD-10-CM | POA: Diagnosis not present

## 2020-08-12 DIAGNOSIS — Z8543 Personal history of malignant neoplasm of ovary: Secondary | ICD-10-CM | POA: Insufficient documentation

## 2020-08-12 DIAGNOSIS — I509 Heart failure, unspecified: Secondary | ICD-10-CM | POA: Insufficient documentation

## 2020-08-12 DIAGNOSIS — Z96653 Presence of artificial knee joint, bilateral: Secondary | ICD-10-CM | POA: Insufficient documentation

## 2020-08-12 DIAGNOSIS — Z85828 Personal history of other malignant neoplasm of skin: Secondary | ICD-10-CM | POA: Diagnosis not present

## 2020-08-12 DIAGNOSIS — G8929 Other chronic pain: Secondary | ICD-10-CM | POA: Insufficient documentation

## 2020-08-12 DIAGNOSIS — M797 Fibromyalgia: Secondary | ICD-10-CM | POA: Insufficient documentation

## 2020-08-12 DIAGNOSIS — Z79899 Other long term (current) drug therapy: Secondary | ICD-10-CM | POA: Diagnosis not present

## 2020-08-12 DIAGNOSIS — Z85118 Personal history of other malignant neoplasm of bronchus and lung: Secondary | ICD-10-CM | POA: Insufficient documentation

## 2020-08-12 DIAGNOSIS — Z96641 Presence of right artificial hip joint: Secondary | ICD-10-CM | POA: Diagnosis not present

## 2020-08-12 DIAGNOSIS — Z96612 Presence of left artificial shoulder joint: Secondary | ICD-10-CM | POA: Diagnosis not present

## 2020-08-12 DIAGNOSIS — Z85528 Personal history of other malignant neoplasm of kidney: Secondary | ICD-10-CM | POA: Insufficient documentation

## 2020-08-12 DIAGNOSIS — Z96649 Presence of unspecified artificial hip joint: Secondary | ICD-10-CM

## 2020-08-12 DIAGNOSIS — T84020A Dislocation of internal right hip prosthesis, initial encounter: Secondary | ICD-10-CM | POA: Diagnosis not present

## 2020-08-12 HISTORY — PX: TOTAL HIP REVISION: SHX763

## 2020-08-12 LAB — TYPE AND SCREEN
ABO/RH(D): AB POS
Antibody Screen: NEGATIVE

## 2020-08-12 SURGERY — TOTAL HIP REVISION
Anesthesia: Spinal | Site: Hip | Laterality: Right

## 2020-08-12 MED ORDER — CELECOXIB 200 MG PO CAPS
200.0000 mg | ORAL_CAPSULE | Freq: Two times a day (BID) | ORAL | Status: DC
  Administered 2020-08-12 – 2020-08-13 (×2): 200 mg via ORAL
  Filled 2020-08-12 (×2): qty 1

## 2020-08-12 MED ORDER — EPHEDRINE SULFATE-NACL 50-0.9 MG/10ML-% IV SOSY
PREFILLED_SYRINGE | INTRAVENOUS | Status: DC | PRN
  Administered 2020-08-12: 5 mg via INTRAVENOUS

## 2020-08-12 MED ORDER — MIDAZOLAM HCL 2 MG/2ML IJ SOLN
INTRAMUSCULAR | Status: DC | PRN
  Administered 2020-08-12 (×2): 1 mg via INTRAVENOUS

## 2020-08-12 MED ORDER — DEXAMETHASONE SODIUM PHOSPHATE 10 MG/ML IJ SOLN
10.0000 mg | Freq: Once | INTRAMUSCULAR | Status: AC
  Administered 2020-08-12: 10 mg via INTRAVENOUS

## 2020-08-12 MED ORDER — CLONAZEPAM 1 MG PO TABS
1.0000 mg | ORAL_TABLET | Freq: Three times a day (TID) | ORAL | Status: DC | PRN
  Administered 2020-08-12: 1 mg via ORAL
  Filled 2020-08-12: qty 1

## 2020-08-12 MED ORDER — CHLORHEXIDINE GLUCONATE 0.12 % MT SOLN: 15.0000 mL | Freq: Once | OROMUCOSAL | Status: AC

## 2020-08-12 MED ORDER — 0.9 % SODIUM CHLORIDE (POUR BTL) OPTIME
TOPICAL | Status: DC | PRN
  Administered 2020-08-12: 1000 mL

## 2020-08-12 MED ORDER — HYDROMORPHONE HCL 1 MG/ML IJ SOLN
0.5000 mg | INTRAMUSCULAR | Status: DC | PRN
  Administered 2020-08-12 (×2): 1 mg via INTRAVENOUS
  Filled 2020-08-12 (×2): qty 1

## 2020-08-12 MED ORDER — BUPROPION HCL ER (SR) 150 MG PO TB12
150.0000 mg | ORAL_TABLET | Freq: Two times a day (BID) | ORAL | Status: DC
  Administered 2020-08-12 – 2020-08-13 (×2): 150 mg via ORAL
  Filled 2020-08-12 (×2): qty 1

## 2020-08-12 MED ORDER — FENTANYL CITRATE (PF) 100 MCG/2ML IJ SOLN
INTRAMUSCULAR | Status: DC | PRN
  Administered 2020-08-12: 50 ug via INTRAVENOUS
  Administered 2020-08-12: 25 ug via INTRAVENOUS

## 2020-08-12 MED ORDER — METHOCARBAMOL 500 MG IVPB - SIMPLE MED
500.0000 mg | Freq: Four times a day (QID) | INTRAVENOUS | Status: DC | PRN
  Filled 2020-08-12: qty 50

## 2020-08-12 MED ORDER — MAGNESIUM CITRATE PO SOLN: 1.0000 | Freq: Once | ORAL | Status: DC | PRN

## 2020-08-12 MED ORDER — ACETAMINOPHEN 500 MG PO TABS
1000.0000 mg | ORAL_TABLET | Freq: Three times a day (TID) | ORAL | Status: DC
  Administered 2020-08-12 – 2020-08-13 (×4): 1000 mg via ORAL
  Filled 2020-08-12 (×4): qty 2

## 2020-08-12 MED ORDER — OXYCODONE HCL 5 MG PO TABS: 5.0000 mg | ORAL_TABLET | ORAL | Status: DC | PRN

## 2020-08-12 MED ORDER — METOCLOPRAMIDE HCL 5 MG PO TABS: 5.0000 mg | ORAL_TABLET | Freq: Three times a day (TID) | ORAL | Status: DC | PRN

## 2020-08-12 MED ORDER — DOCUSATE SODIUM 100 MG PO CAPS
100.0000 mg | ORAL_CAPSULE | Freq: Two times a day (BID) | ORAL | Status: DC
  Administered 2020-08-12 – 2020-08-13 (×2): 100 mg via ORAL
  Filled 2020-08-12 (×2): qty 1

## 2020-08-12 MED ORDER — BUPIVACAINE IN DEXTROSE 0.75-8.25 % IT SOLN
INTRATHECAL | Status: DC | PRN
  Administered 2020-08-12: 1.8 mL via INTRATHECAL

## 2020-08-12 MED ORDER — CEFAZOLIN SODIUM-DEXTROSE 2-4 GM/100ML-% IV SOLN
2.0000 g | INTRAVENOUS | Status: AC
  Administered 2020-08-12: 2 g via INTRAVENOUS
  Filled 2020-08-12: qty 100

## 2020-08-12 MED ORDER — ORAL CARE MOUTH RINSE
15.0000 mL | Freq: Once | OROMUCOSAL | Status: AC
  Administered 2020-08-12: 15 mL via OROMUCOSAL

## 2020-08-12 MED ORDER — MIDAZOLAM HCL 2 MG/2ML IJ SOLN
INTRAMUSCULAR | Status: AC
  Filled 2020-08-12: qty 2

## 2020-08-12 MED ORDER — DEXAMETHASONE SODIUM PHOSPHATE 10 MG/ML IJ SOLN
INTRAMUSCULAR | Status: AC
  Filled 2020-08-12: qty 1

## 2020-08-12 MED ORDER — ONDANSETRON HCL 4 MG/2ML IJ SOLN
4.0000 mg | Freq: Four times a day (QID) | INTRAMUSCULAR | Status: DC | PRN
  Administered 2020-08-13: 4 mg via INTRAVENOUS
  Filled 2020-08-12: qty 2

## 2020-08-12 MED ORDER — LIDOCAINE 2% (20 MG/ML) 5 ML SYRINGE
INTRAMUSCULAR | Status: AC
  Filled 2020-08-12: qty 5

## 2020-08-12 MED ORDER — ONDANSETRON HCL 4 MG/2ML IJ SOLN: 4.0000 mg | Freq: Once | INTRAMUSCULAR | Status: DC | PRN

## 2020-08-12 MED ORDER — PROPOFOL 10 MG/ML IV BOLUS
INTRAVENOUS | Status: AC
  Filled 2020-08-12: qty 40

## 2020-08-12 MED ORDER — ONDANSETRON HCL 4 MG/2ML IJ SOLN
INTRAMUSCULAR | Status: AC
  Filled 2020-08-12: qty 2

## 2020-08-12 MED ORDER — HYDROMORPHONE HCL 1 MG/ML IJ SOLN
INTRAMUSCULAR | Status: AC
  Filled 2020-08-12: qty 1

## 2020-08-12 MED ORDER — ONDANSETRON HCL 4 MG/2ML IJ SOLN
INTRAMUSCULAR | Status: DC | PRN
  Administered 2020-08-12: 4 mg via INTRAVENOUS

## 2020-08-12 MED ORDER — TRIAMTERENE-HCTZ 75-50 MG PO TABS
1.0000 | ORAL_TABLET | Freq: Every day | ORAL | Status: DC
  Filled 2020-08-12 (×2): qty 1

## 2020-08-12 MED ORDER — POLYETHYLENE GLYCOL 3350 17 G PO PACK
17.0000 g | PACK | Freq: Two times a day (BID) | ORAL | Status: DC
  Administered 2020-08-12 – 2020-08-13 (×2): 17 g via ORAL
  Filled 2020-08-12 (×2): qty 1

## 2020-08-12 MED ORDER — PROPOFOL 500 MG/50ML IV EMUL
INTRAVENOUS | Status: DC | PRN
  Administered 2020-08-12: 75 ug/kg/min via INTRAVENOUS

## 2020-08-12 MED ORDER — METHOCARBAMOL 500 MG PO TABS
500.0000 mg | ORAL_TABLET | Freq: Four times a day (QID) | ORAL | Status: DC | PRN
  Administered 2020-08-12 – 2020-08-13 (×3): 500 mg via ORAL
  Filled 2020-08-12 (×3): qty 1

## 2020-08-12 MED ORDER — EPHEDRINE 5 MG/ML INJ
INTRAVENOUS | Status: AC
  Filled 2020-08-12: qty 10

## 2020-08-12 MED ORDER — ZOLPIDEM TARTRATE 5 MG PO TABS
5.0000 mg | ORAL_TABLET | Freq: Every day | ORAL | Status: DC
  Administered 2020-08-12: 5 mg via ORAL
  Filled 2020-08-12: qty 1

## 2020-08-12 MED ORDER — PHENYLEPHRINE HCL-NACL 10-0.9 MG/250ML-% IV SOLN
INTRAVENOUS | Status: DC | PRN
  Administered 2020-08-12: 20 ug/min via INTRAVENOUS

## 2020-08-12 MED ORDER — ROCURONIUM BROMIDE 10 MG/ML (PF) SYRINGE
PREFILLED_SYRINGE | INTRAVENOUS | Status: AC
  Filled 2020-08-12: qty 10

## 2020-08-12 MED ORDER — CEFAZOLIN SODIUM-DEXTROSE 1-4 GM/50ML-% IV SOLN
1.0000 g | Freq: Four times a day (QID) | INTRAVENOUS | Status: AC
  Administered 2020-08-12 (×2): 1 g via INTRAVENOUS
  Filled 2020-08-12 (×2): qty 50

## 2020-08-12 MED ORDER — SODIUM CHLORIDE 0.9 % IV SOLN: INTRAVENOUS | Status: DC

## 2020-08-12 MED ORDER — PHENYLEPHRINE HCL (PRESSORS) 10 MG/ML IV SOLN
INTRAVENOUS | Status: AC
  Filled 2020-08-12: qty 1

## 2020-08-12 MED ORDER — OXYCODONE HCL 5 MG PO TABS
10.0000 mg | ORAL_TABLET | ORAL | Status: DC | PRN
  Administered 2020-08-12 – 2020-08-13 (×4): 10 mg via ORAL
  Filled 2020-08-12 (×4): qty 2

## 2020-08-12 MED ORDER — TRANEXAMIC ACID-NACL 1000-0.7 MG/100ML-% IV SOLN
1000.0000 mg | INTRAVENOUS | Status: AC
  Administered 2020-08-12: 1000 mg via INTRAVENOUS
  Filled 2020-08-12: qty 100

## 2020-08-12 MED ORDER — LEVOTHYROXINE SODIUM 75 MCG PO TABS
75.0000 ug | ORAL_TABLET | Freq: Every day | ORAL | Status: DC
  Administered 2020-08-13: 75 ug via ORAL
  Filled 2020-08-12: qty 1

## 2020-08-12 MED ORDER — ASPIRIN 81 MG PO CHEW
81.0000 mg | CHEWABLE_TABLET | Freq: Two times a day (BID) | ORAL | Status: DC
  Administered 2020-08-12 – 2020-08-13 (×2): 81 mg via ORAL
  Filled 2020-08-12 (×2): qty 1

## 2020-08-12 MED ORDER — PROPOFOL 10 MG/ML IV BOLUS
INTRAVENOUS | Status: DC | PRN
  Administered 2020-08-12 (×2): 20 mg via INTRAVENOUS

## 2020-08-12 MED ORDER — MENTHOL 3 MG MT LOZG: 1.0000 | LOZENGE | OROMUCOSAL | Status: DC | PRN

## 2020-08-12 MED ORDER — HYDROMORPHONE HCL 1 MG/ML IJ SOLN
0.2500 mg | INTRAMUSCULAR | Status: DC | PRN
  Administered 2020-08-12 (×2): 0.5 mg via INTRAVENOUS

## 2020-08-12 MED ORDER — ESCITALOPRAM OXALATE 10 MG PO TABS
10.0000 mg | ORAL_TABLET | Freq: Every day | ORAL | Status: DC
  Administered 2020-08-12 – 2020-08-13 (×2): 10 mg via ORAL
  Filled 2020-08-12 (×2): qty 1

## 2020-08-12 MED ORDER — PHENOL 1.4 % MT LIQD: 1.0000 | OROMUCOSAL | Status: DC | PRN

## 2020-08-12 MED ORDER — ALUM & MAG HYDROXIDE-SIMETH 200-200-20 MG/5ML PO SUSP: 15.0000 mL | ORAL | Status: DC | PRN

## 2020-08-12 MED ORDER — FENTANYL CITRATE (PF) 100 MCG/2ML IJ SOLN
INTRAMUSCULAR | Status: AC
  Filled 2020-08-12: qty 2

## 2020-08-12 MED ORDER — FERROUS SULFATE 325 (65 FE) MG PO TABS
325.0000 mg | ORAL_TABLET | Freq: Three times a day (TID) | ORAL | Status: DC
  Administered 2020-08-12 – 2020-08-13 (×2): 325 mg via ORAL
  Filled 2020-08-12 (×2): qty 1

## 2020-08-12 MED ORDER — METOCLOPRAMIDE HCL 5 MG/ML IJ SOLN: 5.0000 mg | Freq: Three times a day (TID) | INTRAMUSCULAR | Status: DC | PRN

## 2020-08-12 MED ORDER — BISACODYL 10 MG RE SUPP: 10.0000 mg | Freq: Every day | RECTAL | Status: DC | PRN

## 2020-08-12 MED ORDER — STERILE WATER FOR IRRIGATION IR SOLN
Status: DC | PRN
  Administered 2020-08-12: 2000 mL

## 2020-08-12 MED ORDER — MORPHINE SULFATE ER 30 MG PO TBCR
30.0000 mg | EXTENDED_RELEASE_TABLET | Freq: Two times a day (BID) | ORAL | Status: DC
  Administered 2020-08-12 – 2020-08-13 (×2): 30 mg via ORAL
  Filled 2020-08-12 (×2): qty 1

## 2020-08-12 MED ORDER — DIPHENHYDRAMINE HCL 12.5 MG/5ML PO ELIX: 12.5000 mg | ORAL_SOLUTION | ORAL | Status: DC | PRN

## 2020-08-12 MED ORDER — PANTOPRAZOLE SODIUM 40 MG PO TBEC
40.0000 mg | DELAYED_RELEASE_TABLET | Freq: Every day | ORAL | Status: DC
  Administered 2020-08-12 – 2020-08-13 (×2): 40 mg via ORAL
  Filled 2020-08-12 (×2): qty 1

## 2020-08-12 MED ORDER — LACTATED RINGERS IV SOLN: INTRAVENOUS | Status: DC

## 2020-08-12 MED ORDER — ONDANSETRON HCL 4 MG PO TABS: 4.0000 mg | ORAL_TABLET | Freq: Four times a day (QID) | ORAL | Status: DC | PRN

## 2020-08-12 MED ORDER — DEXAMETHASONE SODIUM PHOSPHATE 10 MG/ML IJ SOLN
10.0000 mg | Freq: Once | INTRAMUSCULAR | Status: AC
  Administered 2020-08-13: 10 mg via INTRAVENOUS
  Filled 2020-08-12: qty 1

## 2020-08-12 MED ORDER — TRANEXAMIC ACID-NACL 1000-0.7 MG/100ML-% IV SOLN
1000.0000 mg | Freq: Once | INTRAVENOUS | Status: AC
  Administered 2020-08-12: 1000 mg via INTRAVENOUS
  Filled 2020-08-12: qty 100

## 2020-08-12 SURGICAL SUPPLY — 56 items
BAG DECANTER FOR FLEXI CONT (MISCELLANEOUS) ×3 IMPLANT
BAG ZIPLOCK 12X15 (MISCELLANEOUS) ×3 IMPLANT
BLADE SAW SGTL 18X1.27X75 (BLADE) IMPLANT
BLADE SAW SGTL 18X1.27X75MM (BLADE)
BRUSH FEMORAL CANAL (MISCELLANEOUS) IMPLANT
COVER SURGICAL LIGHT HANDLE (MISCELLANEOUS) ×3 IMPLANT
COVER WAND RF STERILE (DRAPES) IMPLANT
DERMABOND ADVANCED (GAUZE/BANDAGES/DRESSINGS) ×2
DERMABOND ADVANCED .7 DNX12 (GAUZE/BANDAGES/DRESSINGS) ×1 IMPLANT
DRAPE INCISE IOBAN 85X60 (DRAPES) ×3 IMPLANT
DRAPE ORTHO SPLIT 77X108 STRL (DRAPES) ×6
DRAPE POUCH INSTRU U-SHP 10X18 (DRAPES) ×3 IMPLANT
DRAPE SURG 17X11 SM STRL (DRAPES) ×3 IMPLANT
DRAPE SURG ORHT 6 SPLT 77X108 (DRAPES) ×2 IMPLANT
DRAPE U-SHAPE 47X51 STRL (DRAPES) ×3 IMPLANT
DRSG AQUACEL AG ADV 3.5X10 (GAUZE/BANDAGES/DRESSINGS) ×3 IMPLANT
DRSG AQUACEL AG ADV 3.5X14 (GAUZE/BANDAGES/DRESSINGS) IMPLANT
DURAPREP 26ML APPLICATOR (WOUND CARE) ×3 IMPLANT
ELECT BLADE TIP CTD 4 INCH (ELECTRODE) ×3 IMPLANT
ELECT REM PT RETURN 15FT ADLT (MISCELLANEOUS) ×3 IMPLANT
FACESHIELD WRAPAROUND (MASK) ×12 IMPLANT
GLOVE BIO SURGEON STRL SZ 6.5 (GLOVE) ×4 IMPLANT
GLOVE BIO SURGEONS STRL SZ 6.5 (GLOVE) ×2
GLOVE BIOGEL PI IND STRL 7.5 (GLOVE) ×1 IMPLANT
GLOVE BIOGEL PI IND STRL 8.5 (GLOVE) ×1 IMPLANT
GLOVE BIOGEL PI INDICATOR 7.5 (GLOVE) ×2
GLOVE BIOGEL PI INDICATOR 8.5 (GLOVE) ×2
GLOVE ECLIPSE 8.0 STRL XLNG CF (GLOVE) ×6 IMPLANT
GLOVE INDICATOR 6.5 STRL GRN (GLOVE) ×3 IMPLANT
GOWN STRL REUS W/TWL 2XL LVL3 (GOWN DISPOSABLE) ×3 IMPLANT
GOWN STRL REUS W/TWL LRG LVL3 (GOWN DISPOSABLE) ×6 IMPLANT
HANDPIECE INTERPULSE COAX TIP (DISPOSABLE) ×3
HEAD FEM STD 32X+9 STRL (Hips) ×3 IMPLANT
KIT BASIN OR (CUSTOM PROCEDURE TRAY) ×3 IMPLANT
KIT TURNOVER KIT A (KITS) IMPLANT
LINER PINNACLE (Hips) ×3 IMPLANT
MANIFOLD NEPTUNE II (INSTRUMENTS) ×3 IMPLANT
NDL SAFETY ECLIPSE 18X1.5 (NEEDLE) ×1 IMPLANT
NEEDLE HYPO 18GX1.5 SHARP (NEEDLE) ×3
NS IRRIG 1000ML POUR BTL (IV SOLUTION) ×3 IMPLANT
PACK TOTAL JOINT (CUSTOM PROCEDURE TRAY) ×3 IMPLANT
PENCIL SMOKE EVACUATOR (MISCELLANEOUS) ×3 IMPLANT
PROTECTOR NERVE ULNAR (MISCELLANEOUS) ×3 IMPLANT
SET HNDPC FAN SPRY TIP SCT (DISPOSABLE) ×1 IMPLANT
SPONGE LAP 18X18 RF (DISPOSABLE) IMPLANT
STAPLER VISISTAT 35W (STAPLE) IMPLANT
SUCTION FRAZIER HANDLE 12FR (TUBING) ×3
SUCTION TUBE FRAZIER 12FR DISP (TUBING) ×1 IMPLANT
SUT MNCRL AB 3-0 PS2 18 (SUTURE) ×3 IMPLANT
SUT STRATAFIX PDS+ 0 24IN (SUTURE) ×3 IMPLANT
SUT VIC AB 1 CT1 36 (SUTURE) ×3 IMPLANT
SUT VIC AB 2-0 CT1 27 (SUTURE) ×6
SUT VIC AB 2-0 CT1 TAPERPNT 27 (SUTURE) ×2 IMPLANT
TOWEL OR 17X26 10 PK STRL BLUE (TOWEL DISPOSABLE) ×6 IMPLANT
TRAY FOLEY MTR SLVR 16FR STAT (SET/KITS/TRAYS/PACK) ×3 IMPLANT
WATER STERILE IRR 1000ML POUR (IV SOLUTION) ×6 IMPLANT

## 2020-08-12 NOTE — Interval H&P Note (Signed)
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  The various methods of treatment have been discussed with the patient and family. After consideration of risks, benefits and other options for treatment, the patient has consented to  Procedure(s) with comments: RIGHT TOTAL HIP REVISION TO CONSTRAINED LINER (Right) - 90 mins as a surgical intervention.  The patient's history has been reviewed, patient examined, no change in status, stable for surgery.  I have reviewed the patient's chart and labs.  Questions were answered to the patient's satisfaction.     Mauri Pole

## 2020-08-12 NOTE — Anesthesia Postprocedure Evaluation (Signed)
Anesthesia Post Note  Patient: Lakashia R Decuir  Procedure(s) Performed: RIGHT TOTAL HIP REVISION TO CONSTRAINED LINER (Right Hip)     Patient location during evaluation: PACU Anesthesia Type: Spinal Level of consciousness: oriented and awake and alert Pain management: pain level controlled Vital Signs Assessment: post-procedure vital signs reviewed and stable Respiratory status: spontaneous breathing, respiratory function stable and nonlabored ventilation Cardiovascular status: blood pressure returned to baseline and stable Postop Assessment: no headache, no backache, no apparent nausea or vomiting, spinal receding and patient able to bend at knees Anesthetic complications: no   No complications documented.  Last Vitals:  Vitals:   08/12/20 0915 08/12/20 0945  BP: (!) 127/55 125/68  Pulse: 77 67  Resp: (!) 22 13  Temp:    SpO2: 92% 100%    Last Pain:  Vitals:   08/12/20 0930  TempSrc:   PainSc: Asleep                 Iyonna Rish A.

## 2020-08-12 NOTE — Anesthesia Procedure Notes (Signed)
Procedure Name: MAC Date/Time: 08/12/2020 7:16 AM Performed by: Eben Burow, CRNA Pre-anesthesia Checklist: Patient identified, Emergency Drugs available, Suction available, Patient being monitored and Timeout performed Oxygen Delivery Method: Simple face mask Placement Confirmation: positive ETCO2

## 2020-08-12 NOTE — Brief Op Note (Signed)
08/12/2020  10:08 AM  PATIENT:  [name]  68 y.o. 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   Delay start of Pharmacological VTE agent (>24hrs) due to surgical blood loss or risk of bleeding: no

## 2020-08-12 NOTE — Transfer of Care (Signed)
Immediate Anesthesia Transfer of Care Note  Patient: [name]  Procedure(s) Performed: RIGHT TOTAL HIP REVISION TO CONSTRAINED LINER (Right Hip)  Patient Location: PACU  Anesthesia Type:Spinal  Level of Consciousness: awake, alert  and patient cooperative  Airway & Oxygen Therapy: Patient Spontanous Breathing and Patient connected to face mask oxygen  Post-op Assessment: Report given to RN and Post -op Vital signs reviewed and stable  Post vital signs: Reviewed and stable  Last Vitals:  Vitals Value Taken Time  BP 118/60 08/12/20 0851  Temp    Pulse 64 08/12/20 0851  Resp 14 08/12/20 0852  SpO2 100 % 08/12/20 0851  Vitals shown include unvalidated device data.  Last Pain:  Vitals:   08/12/20 0617  TempSrc: Oral  PainSc:       Patients Stated Pain Goal: 2 (74/25/95 6387)  Complications: No complications documented.

## 2020-08-12 NOTE — Evaluation (Signed)
Physical Therapy Evaluation Patient Details Name: [name] MRN: [id number] DOB: [birth year] Today's Date: 08/12/2020   History of Present Illness  Patient is 69 y.o. female s/p Rt THR on 08/12/20 with PMH significant for melanoma, lung, kidney, and ovarian cancer, CHF, OA, depression, anxiety, fibromyalgia, Bil TKA's, Rt THA on 02/18/19 with revision on 03/27/19, lumbar fusion L2-3.    Clinical Impression  [name] is a 69 y.o. female POD 0 s/p Rt THR. Patient reports indepenence with mobility at baseline. Patient is now limited by functional impairments (see PT problem list below) and requires min assist for transfers and gait with RW. Patient was able to ambulate ~50 feet with RW and min assist. Patient instructed in exercise to facilitate ROM and circulation. Patient will benefit from continued skilled PT interventions to address impairments and progress towards PLOF. Acute PT will follow to progress mobility and stair training in preparation for safe discharge home.     Follow Up Recommendations Follow surgeon's recommendation for DC plan and follow-up therapies    Equipment Recommendations  None recommended by PT    Recommendations for Other Services       Precautions / Restrictions Precautions Precautions: Fall;Posterior Hip Precaution Booklet Issued: Yes (comment) Restrictions Weight Bearing Restrictions: No Other Position/Activity Restrictions: WBAT      Mobility  Bed Mobility Overal bed mobility: Needs Assistance Bed Mobility: Supine to Sit     Supine to sit: Min assist;HOB elevated     General bed mobility comments: VC's for maintaining posterior hip precautions and assist for Rt LE mobility. Min assist to fully raise trunk. pt able to scoot forward to place feet on floor.    Transfers Overall transfer level: Needs assistance Equipment used: Rolling walker (2 wheeled) Transfers: Sit to/from Stand Sit to Stand: Min guard;Min assist         General  transfer comment: Light assist/min guard for power up and rise, pt steady in standing. cues provided for posterior hip precautions.  Ambulation/Gait Ambulation/Gait assistance: Min assist Gait Distance (Feet): 50 Feet Assistive device: Rolling walker (2 wheeled) Gait Pattern/deviations: Step-to pattern;Decreased stance time - right;Decreased weight shift to right Gait velocity: decr   General Gait Details: cues for safe step to pattern and proximity to RW, pt maintained throughout. no overt LOB noted.  Stairs            Wheelchair Mobility    Modified Rankin (Stroke Patients Only)       Balance Overall balance assessment: Needs assistance Sitting-balance support: Feet supported Sitting balance-Leahy Scale: Good     Standing balance support: During functional activity;Bilateral upper extremity supported Standing balance-Leahy Scale: Poor                               Pertinent Vitals/Pain Pain Assessment: Faces Faces Pain Scale: Hurts little more Pain Location: Rt hip Pain Descriptors / Indicators: Aching;Discomfort Pain Intervention(s): Limited activity within patient's tolerance;Monitored during session;Repositioned    Home Living Family/patient expects to be discharged to:: Private residence Living Arrangements: Spouse/significant other (daughters live close by) Available Help at Discharge: Family Type of Home: House Home Access: Stairs to enter Entrance Stairs-Rails: None (wall on both side no rails) Entrance Stairs-Number of Steps: 4-5 Home Layout: One level Home Equipment: Walker - 2 wheels;Bedside commode;Cane - single point      Prior Function Level of Independence: Independent with assistive device(s)         Comments:  pt in brace and with SPC     Hand Dominance   Dominant Hand: Right    Extremity/Trunk Assessment   Upper Extremity Assessment Upper Extremity Assessment: Overall WFL for tasks assessed    Lower Extremity  Assessment Lower Extremity Assessment: RLE deficits/detail RLE Deficits / Details: good quad activation, testing limited for posterior hip precautions RLE Sensation: WNL RLE Coordination: WNL    Cervical / Trunk Assessment Cervical / Trunk Assessment: Normal  Communication   Communication: No difficulties  Cognition Arousal/Alertness: Awake/alert Behavior During Therapy: WFL for tasks assessed/performed Overall Cognitive Status: Within Functional Limits for tasks assessed                                        General Comments      Exercises Total Joint Exercises Ankle Circles/Pumps: AROM;Both;Seated;20 reps Quad Sets: AROM;Right;10 reps;Seated   Assessment/Plan    PT Assessment Patient needs continued PT services  PT Problem List Decreased strength;Decreased range of motion;Decreased activity tolerance;Decreased balance;Decreased mobility;Decreased knowledge of use of DME;Decreased knowledge of precautions;Pain       PT Treatment Interventions DME instruction;Gait training;Stair training;Functional mobility training;Therapeutic activities;Therapeutic exercise;Balance training;Patient/family education    PT Goals (Current goals can be found in the Care Plan section)  Acute Rehab PT Goals Patient Stated Goal: regain independence and be able to garden again. PT Goal Formulation: With patient Time For Goal Achievement: 08/19/20 Potential to Achieve Goals: Good    Frequency 7X/week   Barriers to discharge        Co-evaluation               AM-PAC PT "6 Clicks" Mobility  Outcome Measure Help needed turning from your back to your side while in a flat bed without using bedrails?: A Little Help needed moving from lying on your back to sitting on the side of a flat bed without using bedrails?: A Little Help needed moving to and from a bed to a chair (including a wheelchair)?: A Little Help needed standing up from a chair using your arms (e.g.,  wheelchair or bedside chair)?: A Little Help needed to walk in hospital room?: A Little Help needed climbing 3-5 steps with a railing? : A Little 6 Click Score: 18    End of Session Equipment Utilized During Treatment: Gait belt Activity Tolerance: Patient tolerated treatment well Patient left: in chair;with call bell/phone within reach;with chair alarm set;with family/visitor present Nurse Communication: Mobility status PT Visit Diagnosis: Muscle weakness (generalized) (M62.81);Difficulty in walking, not elsewhere classified (R26.2)    Time: 5456-2563 PT Time Calculation (min) (ACUTE ONLY): 24 min   Charges:   PT Evaluation $PT Eval Low Complexity: 1 Low PT Treatments $Gait Training: 8-22 mins       Verner Mould, DPT Acute Rehabilitation Services  Office [phone] Pager [phone]  08/12/2020 6:43 PM

## 2020-08-12 NOTE — Op Note (Signed)
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  PROCEDURE:  Revision right total hip arthroplasty to a constrained liner.  The components used were a DePuy 52 x 32 neutral constrained liner with a 32+9 Articul/Eze metal ball.  SURGEON:  Paralee Cancel, MD  ASSISTANT:  Griffith Citron, PA-C.  Note that [name] was present for the entirety of the case from preoperative positioning, perioperative management of the operative extremity, general facilitation of the case, and primary wound closure.  ANESTHESIA:  Spinal.  SPECIMENS:  None.  COMPLICATIONS:  None.  DRAINS:  None.  BLOOD LOSS:  About 100 mL.  INDICATIONS:  The patient is a 69 year old female who had sustained a ground level fall and resulted in a femoral neck fracture.  She had a right total hip arthroplasty that was performed by another surgeon.  She had a fairly early onset of recurrent  instability of the right hip.  She was referred for surgical management.  I did revise her by adjusting her acetabular component, putting a lipped liner or a face face-changing liner in place.  Despite this, with a 36+5 ball, she had recurrent episodes  of instability.  Important to note, she did have a history of lumbar spine fusion.  Given the recurrent instability and dislocation episodes despite attempts at conservative treatment and management she wished at this point in proceeding with surgical  intervention.  I felt at this point based on the position of her  components that a constrained liner would serve best to provide some protection against instability.  We reviewed the risk of recurrent instability, need for future surgery, standard risk  of infection, DVT, need were all discussed and reviewed.  Consent was obtained for benefit of pain relief and stability.  DESCRIPTION OF PROCEDURE:  The patient was brought to the operative theater.  Once adequate anesthesia, preoperative antibiotics, Ancef administered as well as tranexamic acid, she was positioned into the left lateral decubitus position with the right  hip up.  The right lower extremity was then prepped and draped in sterile fashion.  Timeout was performed identifying the patient, the planned procedure and extremity.  A portion of her posterior incision was utilized.  Soft tissue planes created.  The  posterior aspect of the hip was exposed through the iliotibial band and gluteal fascia.  There was no posterior capsule to preserve.  When we got into the hip joint, we encountered clear yellow synovial fluid.  I then exposed the posterior aspect of the  hip.  Following this, I dislocated the hip.  We removed the old femoral head.  I then placed the trunnion on the ileum.  Following further debridement around the cup for exposure purposes we removed the old acetabular liner.  We then opened up the 52 x  32 neutral constrained liner, which was then impacted under direct visualization with good rim fit.  Based on preoperative evaluations, I decided to go up from the +5 ball to the +9 ball.  We selected the 32+9 Articul/Eze ball.  This was then reduced  into the liner with the ring around it.  We then snapped  the ball into place with direct pressure and then applied the ring over top of this onto the acetabular liner with circumferential visualized symmetric fit.  We irrigated the hip with normal saline  solution.  No significant hemostasis required.  Please note that I did debride scar tissue over the  anterior aspect of the joint and capsule region to prevent a potential source of leveraging.  The leg was then brought to a neutral position. The  iliotibial band and gluteal fascia were reapproximated using #1 Vicryl and #1 Stratafix suture.  The remainder of the wound was closed with 2-0 Vicryl and a running Monocryl stitch.  The hip was then cleaned, dried, and dressed sterilely using surgical  glue and Aquacel dressing.  She was then brought to the recovery room in stable condition, tolerating the procedure well.  CN/NUANCE  D:08/12/2020 T:08/12/2020 JOB:[id number]

## 2020-08-12 NOTE — Discharge Instructions (Signed)

## 2020-08-12 NOTE — Anesthesia Procedure Notes (Signed)
Spinal  Patient location during procedure: OR Start time: 08/12/2020 7:15 AM End time: 08/12/2020 7:19 AM Staffing Performed: anesthesiologist  Anesthesiologist: Josephine Igo, MD Preanesthetic Checklist Completed: patient identified, IV checked, site marked, risks and benefits discussed, surgical consent, monitors and equipment checked, pre-op evaluation and timeout performed Spinal Block Patient position: sitting Prep: DuraPrep and site prepped and draped Patient monitoring: heart rate, cardiac monitor, continuous pulse ox and blood pressure Approach: midline Location: L3-4 Injection technique: single-shot Needle Needle type: Pencan  Needle gauge: 24 G Needle length: 9 cm Needle insertion depth: 7 cm Assessment Sensory level: T4 Additional Notes Patient tolerated procedure well. Adequate sensory level.

## 2020-08-13 ENCOUNTER — Encounter (HOSPITAL_COMMUNITY): Payer: Self-pay | Admitting: Orthopedic Surgery

## 2020-08-13 DIAGNOSIS — T84020A Dislocation of internal right hip prosthesis, initial encounter: Secondary | ICD-10-CM | POA: Diagnosis not present

## 2020-08-13 LAB — BASIC METABOLIC PANEL
Anion gap: 9 (ref 5–15)
BUN: 33 mg/dL — ABNORMAL HIGH (ref 8–23)
CO2: 24 mmol/L (ref 22–32)
Calcium: 9.2 mg/dL (ref 8.9–10.3)
Chloride: 107 mmol/L (ref 98–111)
Creatinine, Ser: 1.28 mg/dL — ABNORMAL HIGH (ref 0.44–1.00)
GFR, Estimated: 45 mL/min — ABNORMAL LOW (ref >60)
Glucose, Bld: 151 mg/dL — ABNORMAL HIGH (ref 70–99)
Potassium: 3.6 mmol/L (ref 3.5–5.1)
Sodium: 140 mmol/L (ref 135–145)

## 2020-08-13 LAB — CBC
HCT: 30 % — ABNORMAL LOW (ref 36.0–46.0)
Hemoglobin: 9.6 g/dL — ABNORMAL LOW (ref 12.0–15.0)
MCH: 29.5 pg (ref 26.0–34.0)
MCHC: 32 g/dL (ref 30.0–36.0)
MCV: 92.3 fL (ref 80.0–100.0)
Platelets: 159 10*3/uL (ref 150–400)
RBC: 3.25 MIL/uL — ABNORMAL LOW (ref 3.87–5.11)
RDW: 12.4 % (ref 11.5–15.5)
WBC: 8.4 10*3/uL (ref 4.0–10.5)
nRBC: 0 % (ref 0.0–0.2)

## 2020-08-13 MED ORDER — POLYETHYLENE GLYCOL 3350 17 G PO PACK: 17.0000 g | PACK | Freq: Two times a day (BID) | ORAL | 0 refills | Status: DC

## 2020-08-13 MED ORDER — ASPIRIN 81 MG PO CHEW: 81.0000 mg | CHEWABLE_TABLET | Freq: Two times a day (BID) | ORAL | 0 refills | Status: AC

## 2020-08-13 MED ORDER — DOCUSATE SODIUM 100 MG PO CAPS: 100.0000 mg | ORAL_CAPSULE | Freq: Two times a day (BID) | ORAL | 0 refills | Status: DC

## 2020-08-13 MED ORDER — METHOCARBAMOL 500 MG PO TABS: 500.0000 mg | ORAL_TABLET | Freq: Four times a day (QID) | ORAL | 0 refills | Status: DC | PRN

## 2020-08-13 MED ORDER — OXYCODONE HCL 5 MG PO TABS
5.0000 mg | ORAL_TABLET | ORAL | 0 refills | Status: DC | PRN
Start: 2020-08-13 — End: 2021-11-29

## 2020-08-13 MED ORDER — ACETAMINOPHEN 500 MG PO TABS: 1000.0000 mg | ORAL_TABLET | Freq: Three times a day (TID) | ORAL | 0 refills | Status: DC

## 2020-08-13 NOTE — Progress Notes (Signed)
Physical Therapy Treatment Patient Details Name: [name] MRN: [id number] DOB: 1951-01-06 Today's Date: 08/13/2020    History of Present Illness Patient is 69 y.o. female s/p Rt THR on 08/12/20 with PMH significant for melanoma, lung, kidney, and ovarian cancer, CHF, OA, depression, anxiety, fibromyalgia, Bil TKA's, Rt THA on 02/18/19 with revision on 03/27/19, lumbar fusion L2-3.    PT Comments    Pt tolerates longer ambulation this session and is able to navigate stairs with cues for sequencing without physical assistance. Pt able to appropriately recall posterior hip precautions at beginning of treatment session, but requires reminder cue when rising from low seated toilet. Pt demonstrates good strength and safety with mobility while using RW and good carryover of posterior hip precautions with mobility.    Follow Up Recommendations  Follow surgeon's recommendation for DC plan and follow-up therapies     Equipment Recommendations  None recommended by PT    Recommendations for Other Services       Precautions / Restrictions Precautions Precautions: Fall;Posterior Hip Precaution Booklet Issued: Yes (comment) (at eval) Restrictions Weight Bearing Restrictions: No Other Position/Activity Restrictions: WBAT    Mobility  Bed Mobility Overal bed mobility: Needs Assistance Bed Mobility: Supine to Sit  Supine to sit: Supervision;HOB elevated  General bed mobility comments: increased time, cues to maintain poserior hip precautins with RLE mobility  Transfers Overall transfer level: Needs assistance Equipment used: Rolling walker (2 wheeled) Transfers: Sit to/from Stand Sit to Stand: Supervision    General transfer comment: BUE assisting to power up from elevated bed, use of hand rail from low seated toilet with cues for maintaining posterior hip precautions  Ambulation/Gait Ambulation/Gait assistance: Supervision Gait Distance (Feet): 200 Feet Assistive device: Rolling  walker (2 wheeled) Gait Pattern/deviations: Step-to pattern;Step-through pattern Gait velocity: slightly decreased  General Gait Details: initially decreased weight shift to RLE, improving with distance to fluid step through pattern, maintains hip precautions wtih turns, no LOB   Stairs Stairs: Yes Stairs assistance: Supervision Stair Management: Two rails;Step to pattern Number of Stairs: 4 General stair comments: cued for ascending with LLE and descend leading with RLE, good steadiness, sligtly impulsive requiring reminder of sequencing   Wheelchair Mobility    Modified Rankin (Stroke Patients Only)       Balance   Sitting-balance support: Feet supported;No upper extremity supported Sitting balance-Leahy Scale: Fair     Standing balance support: During functional activity;No upper extremity supported Standing balance-Leahy Scale: Fair         Cognition Arousal/Alertness: Awake/alert Behavior During Therapy: WFL for tasks assessed/performed Overall Cognitive Status: Within Functional Limits for tasks assessed       Exercises      General Comments        Pertinent Vitals/Pain Pain Assessment: 0-10 Pain Score: 6  Pain Location: R hip Pain Descriptors / Indicators: Aching;Discomfort Pain Intervention(s): Limited activity within patient's tolerance;Monitored during session;Premedicated before session;Repositioned    Home Living                      Prior Function            PT Goals (current goals can now be found in the care plan section) Acute Rehab PT Goals Patient Stated Goal: regain independence and be able to garden again. PT Goal Formulation: With patient Time For Goal Achievement: 08/19/20 Potential to Achieve Goals: Good Progress towards PT goals: Progressing toward goals    Frequency    7X/week  PT Plan Current plan remains appropriate    Co-evaluation              AM-PAC PT "6 Clicks" Mobility   Outcome  Measure  Help needed turning from your back to your side while in a flat bed without using bedrails?: None Help needed moving from lying on your back to sitting on the side of a flat bed without using bedrails?: None Help needed moving to and from a bed to a chair (including a wheelchair)?: None Help needed standing up from a chair using your arms (e.g., wheelchair or bedside chair)?: None Help needed to walk in hospital room?: None Help needed climbing 3-5 steps with a railing? : A Little 6 Click Score: 23    End of Session Equipment Utilized During Treatment: Gait belt Activity Tolerance: Patient tolerated treatment well Patient left: in bed;with call bell/phone within reach Nurse Communication: Mobility status PT Visit Diagnosis: Muscle weakness (generalized) (M62.81);Difficulty in walking, not elsewhere classified (R26.2)     Time: 9323-5573 PT Time Calculation (min) (ACUTE ONLY): 25 min  Charges:  $Gait Training: 8-22 mins $Therapeutic Activity: 8-22 mins                      Tori Mitesh Rosendahl PT, DPT 08/13/20, 12:06 PM

## 2020-08-13 NOTE — TOC Transition Note (Signed)
Transition of Care [facility]) - CM/SW Discharge Note   Patient Details  Name: [name] MRN: [id number] Date of Birth: 08-22-1951  Transition of Care [facility]) CM/SW Contact:  Lia Hopping, LCSW Phone Number: 08/13/2020, 1:05 PM   Clinical Narrative:    Therapy Plan: HEP Patient has DME   Final next level of care: Home/Self Care (HEP) Barriers to Discharge: Barriers Resolved   Patient Goals and CMS Choice        Discharge Placement                       Discharge Plan and Services                                     Social Determinants of Health (SDOH) Interventions     Readmission Risk Interventions No flowsheet data found.

## 2020-08-13 NOTE — Progress Notes (Signed)
     Subjective: 1 Day Post-Op Procedure(s) (LRB): RIGHT TOTAL HIP REVISION TO CONSTRAINED LINER (Right)   Seen by Dr. Alvan Dame. Patient reports pain as mild, controlled with medications.  No reported events throughout the night.  Dr. Alvan Dame discussed the procedure, findings and expectations moving forward.  Ready to be discharged home, if they do well with PT.  Follow up in the clinic in 2 weeks.  Knows to call with any questions or concerns.       Objective:   VITALS:   Vitals:   08/13/20 0145 08/13/20 0619  BP: 124/61 (!) 128/58  Pulse: 73 75  Resp: 15 15  Temp: (!) 97.5 F (36.4 C) (!) 97.5 F (36.4 C)  SpO2: 99% 97%    Dorsiflexion/Plantar flexion intact Incision: dressing C/D/I No cellulitis present Compartment soft  LABS Recent Labs    08/13/20 0316  HGB 9.6*  HCT 30.0*  WBC 8.4  PLT 159    Recent Labs    08/13/20 0316  NA 140  K 3.6  BUN 33*  CREATININE 1.28*  GLUCOSE 151*     Assessment/Plan: 1 Day Post-Op Procedure(s) (LRB): RIGHT TOTAL HIP REVISION TO CONSTRAINED LINER (Right) Foley cath d/c'ed Advance diet Up with therapy D/C IV fluids Discharge home  Follow up in 2 weeks at [facility] Follow up with OLIN,Malley Hauter D in 2 weeks.  Contact information:  [facility] [address] [place] [address] [phone]    Overweight (BMI 25-29.9) Estimated body mass index is 26.29 kg/m as calculated from the following:   Height as of this encounter: 5\' 5"  (1.651 m).   Weight as of this encounter: 71.7 kg. Patient also counseled that weight may inhibit the healing process Patient counseled that losing weight will help with future health issues      Danae Orleans PA-C  [facility]  [place] [address], [place], [place] [address] Phone: [phone] [email or web address] Facebook  [employer]

## 2020-08-13 NOTE — Discharge Summary (Signed)
Patient ID: [name] MRN: [id number] DOB/AGE: 1951-02-14 69 y.o.  Admit date: 08/12/2020 Discharge date: 08/13/2020  Admission Diagnoses:  Principal Problem:   S/P right TH revision   Discharge Diagnoses:  Same  Past Medical History:  Diagnosis Date  . Anemia   . Anxiety   . Arthritis    "everywhere" (08/19/2013)  . Bilateral carpal tunnel syndrome   . Chest pain at rest, rule out pericarditis 11/27/2012  . CHF (congestive heart failure) ([place])   . Chronic kidney disease    dr Risa Grill  . Chronic lower back pain   . Closed displaced fracture of right femoral neck ([place]) 02/18/2019  . Complication of anesthesia    "woke up twice during knee replacement" (08/19/2013)  . Depression   . Dyspnea    w/ exertion    . Fibromyalgia   . Headache(784.0)    hx of migraines   . History of pericarditis, with pericardial window in 2009 11/27/2012  . Hx of ovarian cancer 11/27/2012  . Hypothyroidism 11/27/2012  . Iron deficiency anemia   . Kidney carcinoma ([place])    "both kidneys; ~ 3 yr apart" (08/19/2013)  . Lung cancer ([place]) 2018  . Melanoma of back ([place])   . Migraines   . Pneumonia    "several times; including today", RUL/notes 08/19/2013  . Squamous carcinoma    "face, side of my nose, chest; used acid to get rid of them" (08/19/2013)  . Venous insufficiency 11/27/2012    Surgeries: Procedure(s): RIGHT TOTAL HIP REVISION TO CONSTRAINED LINER on 08/12/2020   Consultants: N/A  Discharged Condition: Improved  Hospital Course: Evelyn R Leavens is an 69 y.o. female who was admitted 08/12/2020 for operative treatment of failed previous THA. Patient has severe unremitting pain that affects sleep, daily activities, and work/hobbies. After pre-op clearance the patient was taken to the operating room on 08/12/2020 and underwent  Procedure(s):  RIGHT TOTAL HIP REVISION TO CONSTRAINED LINER.    Patient was given perioperative antibiotics:  Anti-infectives (From admission, onward)   Start      Dose/Rate Route Frequency Ordered Stop   08/12/20 1400  ceFAZolin (ANCEF) IVPB 1 g/50 mL premix        1 g 100 mL/hr over 30 Minutes Intravenous Every 6 hours 08/12/20 1315 08/12/20 2011   08/12/20 0600  ceFAZolin (ANCEF) IVPB 2g/100 mL premix        2 g 200 mL/hr over 30 Minutes Intravenous On call to O.R. 08/12/20 5170 08/12/20 0723       Patient was given sequential compression devices, early ambulation, and chemoprophylaxis to prevent DVT.  Patient benefited maximally from hospital stay and there were no complications.    Recent vital signs:  Patient Vitals for the past 24 hrs:  BP Temp Temp src Pulse Resp SpO2  08/13/20 0619 (!) 128/58 (!) 97.5 F (36.4 C) Oral 75 15 97 %  08/13/20 0145 124/61 (!) 97.5 F (36.4 C) Oral 73 15 99 %  08/12/20 2137 129/61 97.6 F (36.4 C) Oral 78 16 99 %  08/12/20 1733 (!) 128/57 (!) 97.5 F (36.4 C) -- 77 16 99 %  08/12/20 1635 137/65 97.8 F (36.6 C) -- 73 16 100 %  08/12/20 1521 122/72 97.6 F (36.4 C) -- 75 16 100 %  08/12/20 1430 140/70 (!) 97.5 F (36.4 C) -- 76 14 100 %  08/12/20 1311 140/74 (!) 97.5 F (36.4 C) -- 77 16 99 %  08/12/20 1145 (!) 128/95 -- -- 73 Marland Kitchen)  37 100 %  08/12/20 1100 (!) 125/93 -- -- 70 20 100 %  08/12/20 1045 134/68 -- -- 69 19 100 %  08/12/20 1030 130/69 -- -- 68 15 100 %  08/12/20 1015 (!) 111/101 -- -- 66 17 100 %  08/12/20 1000 120/63 -- -- 67 14 91 %  08/12/20 0945 125/68 -- -- 67 13 100 %  08/12/20 0915 (!) 127/55 -- -- 77 (!) 22 92 %  08/12/20 0900 91/80 -- -- 66 16 100 %  08/12/20 0852 119/60 (!) 97.4 F (36.3 C) -- 65 14 100 %     Recent laboratory studies:  Recent Labs    08/13/20 0316  WBC 8.4  HGB 9.6*  HCT 30.0*  PLT 159  NA 140  K 3.6  CL 107  CO2 24  BUN 33*  CREATININE 1.28*  GLUCOSE 151*  CALCIUM 9.2     Discharge Medications:   Allergies as of 08/13/2020      Reactions   Amoxicillin Other (See Comments)   UNSPECIFIED REACTION  Has patient had a PCN reaction causing  immediate rash, facial/tongue/throat swelling, SOB or lightheadedness with hypotension: No Has patient had a PCN reaction causing severe rash involving mucus membranes or skin necrosis: No Has patient had a PCN reaction that required hospitalization No Has patient had a PCN reaction occurring within the last 10 years: No If all of the above answers are "NO", then may proceed with Cephalosporin use.   Ampicillin Rash, Other (See Comments)   Has patient had a PCN reaction causing immediate rash, facial/tongue/throat swelling, SOB or lightheadedness with hypotension: No Has patient had a PCN reaction causing severe rash involving mucus membranes or skin necrosis: No Has patient had a PCN reaction that required hospitalization No Has patient had a PCN reaction occurring within the last 10 years: No If all of the above answers are "NO", then may proceed with Cephalosporin use.      Medication List    STOP taking these medications   diphenoxylate-atropine 2.5-0.025 MG tablet Commonly known as: LOMOTIL   loperamide 2 MG capsule Commonly known as: IMODIUM   ondansetron 4 MG tablet Commonly known as: ZOFRAN   vitamin B-12 1000 MCG tablet Commonly known as: CYANOCOBALAMIN     TAKE these medications   acetaminophen 500 MG tablet Commonly known as: TYLENOL Take 2 tablets (1,000 mg total) by mouth every 8 (eight) hours. What changed: Another medication with the same name was removed. Continue taking this medication, and follow the directions you see here.   aspirin 81 MG chewable tablet Commonly known as: Aspirin Childrens Chew 1 tablet (81 mg total) by mouth 2 (two) times daily. Take for 4 weeks, then resume regular dose. Start taking on: August 14, 2020   buPROPion 150 MG 12 hr tablet Commonly known as: WELLBUTRIN SR TAKE 1 TABLET BY MOUTH DAILY What changed: when to take this   clonazePAM 1 MG tablet Commonly known as: KLONOPIN Take 1 mg by mouth 3 (three) times daily as needed  for anxiety.   D-Mannose 500 MG Caps Take 500 mg by mouth daily.   docusate sodium 100 MG capsule Commonly known as: Colace Take 1 capsule (100 mg total) by mouth 2 (two) times daily.   ergocalciferol 1.25 MG (50000 UT) capsule Commonly known as: VITAMIN D2 Take 50,000 Units by mouth once a week.   escitalopram 10 MG tablet Commonly known as: LEXAPRO Take 10 mg by mouth daily.   ferrous sulfate 325 (  65 FE) MG tablet Commonly known as: FerrouSul Take 1 tablet (325 mg total) by mouth 3 (three) times daily with meals. What changed: when to take this   levothyroxine 75 MCG tablet Commonly known as: SYNTHROID Take 75 mcg by mouth daily before breakfast. What changed: Another medication with the same name was removed. Continue taking this medication, and follow the directions you see here.   methocarbamol 500 MG tablet Commonly known as: Robaxin Take 1 tablet (500 mg total) by mouth every 6 (six) hours as needed for muscle spasms.   morphine 30 MG 12 hr tablet Commonly known as: MS CONTIN Take 1 tablet (30 mg total) by mouth every 12 (twelve) hours.   multivitamin with minerals Tabs tablet Take 1 tablet by mouth daily.   oxyCODONE 5 MG immediate release tablet Commonly known as: Oxy IR/ROXICODONE Take 1-2 tablets (5-10 mg total) by mouth every 4 (four) hours as needed for moderate pain or severe pain.   pantoprazole 40 MG tablet Commonly known as: PROTONIX Take 40 mg by mouth daily.   polyethylene glycol 17 g packet Commonly known as: MIRALAX / GLYCOLAX Take 17 g by mouth 2 (two) times daily.   psyllium 58.6 % packet Commonly known as: METAMUCIL Take 1 packet by mouth 2 (two) times daily.   triamterene-hydrochlorothiazide 75-50 MG tablet Commonly known as: MAXZIDE Take 1 tablet by mouth daily.   zolpidem 5 MG tablet Commonly known as: AMBIEN Take 5 mg by mouth at bedtime.       Diagnostic Studies: DG Pelvis Portable  Result Date: 08/12/2020 CLINICAL DATA:   Hip replacement EXAM: PORTABLE PELVIS 1-2 VIEWS COMPARISON:  05/23/2020 FINDINGS: Interval revision of right hip prosthesis with new acetabular cup placed. Normal alignment. Prosthesis in good position. No loosening. Negative for fracture. IMPRESSION: Right hip revision. Electronically Signed   By: Franchot Gallo M.D.   On: 08/12/2020 09:13    Disposition:  Home     Follow-up Information    Paralee Cancel, MD. Schedule an appointment as soon as possible for a visit in 2 weeks.   Specialty: Orthopedic Surgery Contact information: [address] [place] [address] [phone]                Signed: Lucille Passy [facility] 08/13/2020, 8:43 AM

## 2020-09-16 IMAGING — CT CT ABD-PELV W/O
2 of 4 series · 15 of 46 positions shown, 17 images · non-contrast
Comparison: Prior CT from 08/24/2017

CLINICAL DATA: Initial evaluation for acute generalized abdominal
pain, vomiting, diarrhea.

EXAM:
CT ABDOMEN AND PELVIS WITHOUT CONTRAST
TECHNIQUE: Multidetector CT imaging of the abdomen and pelvis was performed
following the standard protocol without IV contrast.

[Series 2: axial st · axial · 0.75mm/px · z∈[-473,-48]mm · 12 of 97 slices shown, 14 images]
[im 6/97  soft-tissue]
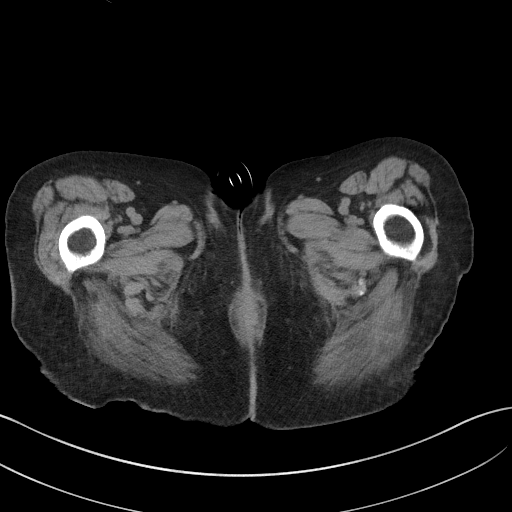
[im 6/97  bone]
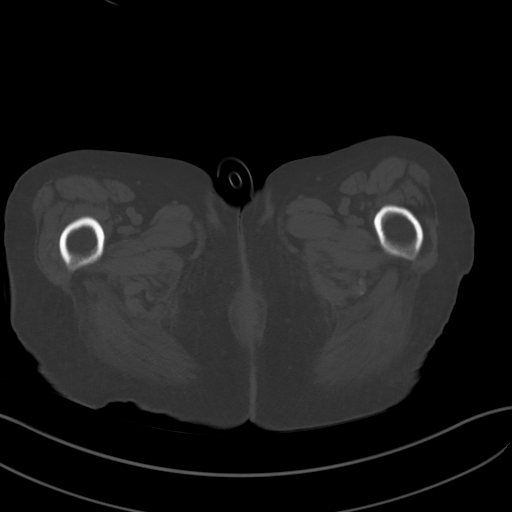
[im 17/97  soft-tissue]
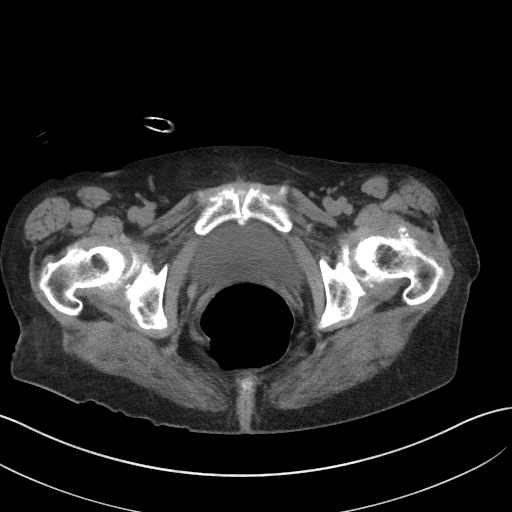
[im 23/97  soft-tissue]
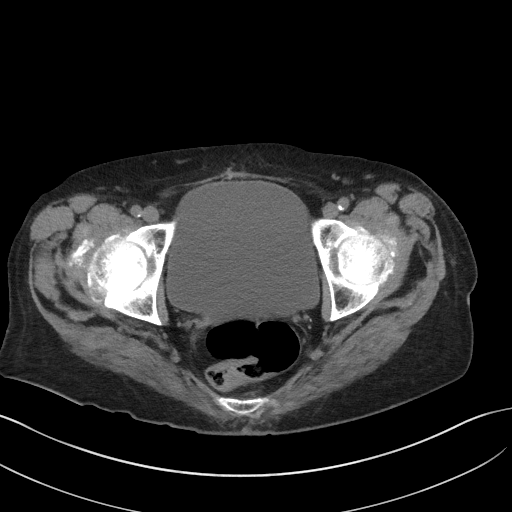
[im 29/97  soft-tissue]
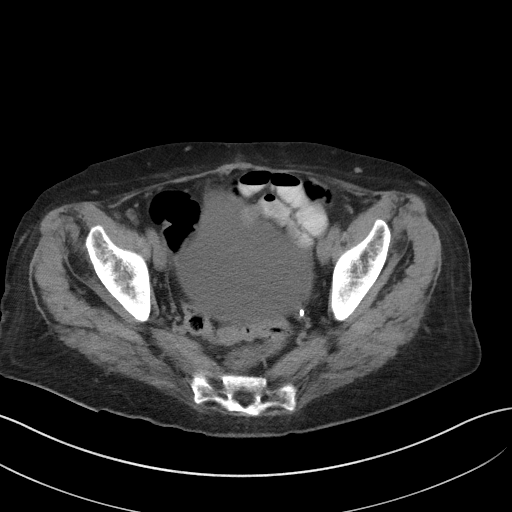
[im 40/97  soft-tissue]
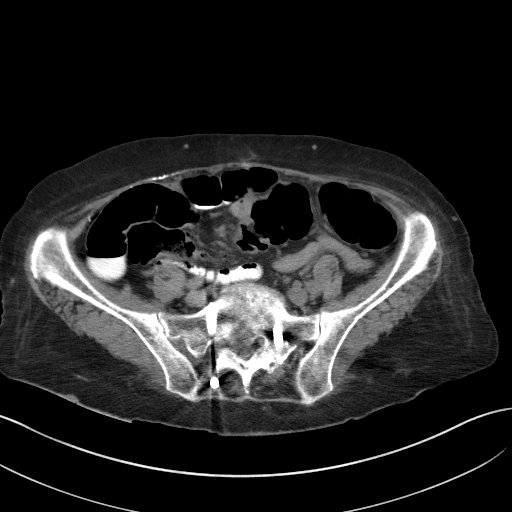
[im 46/97  soft-tissue]
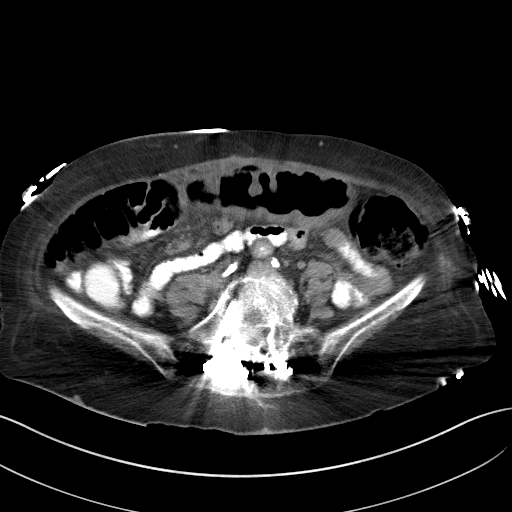
[im 51/97  soft-tissue]
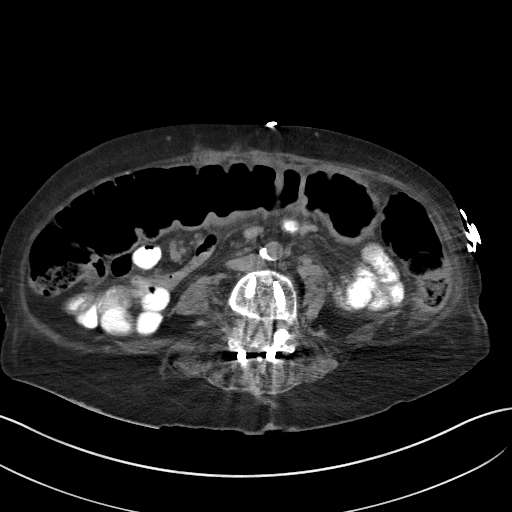
[im 63/97  soft-tissue]
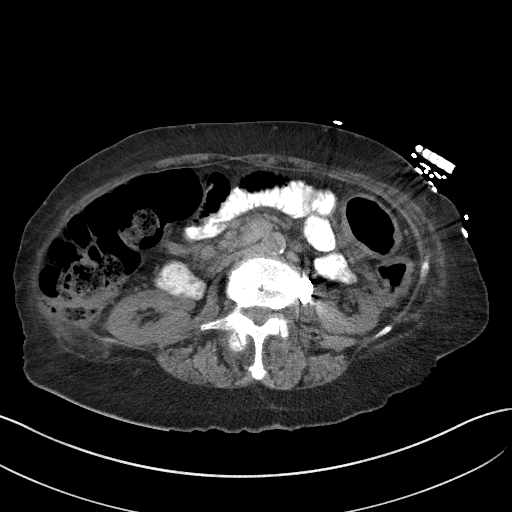
[im 68/97  soft-tissue]
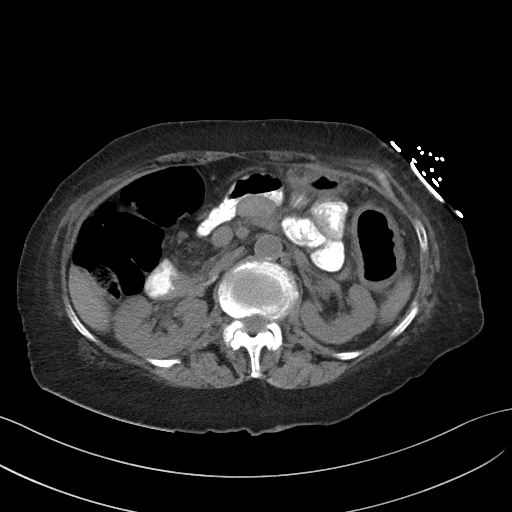
[im 68/97  bone]
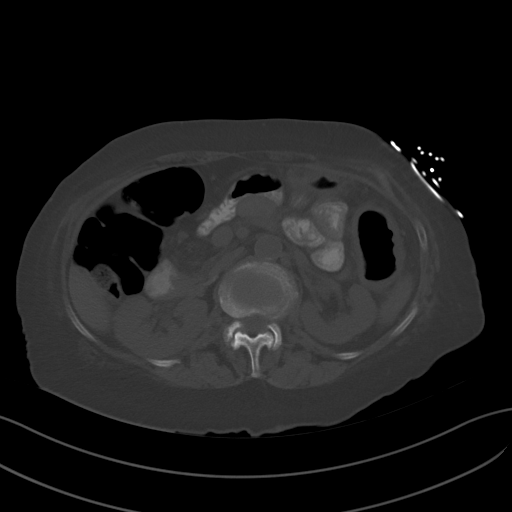
[im 74/97  soft-tissue]
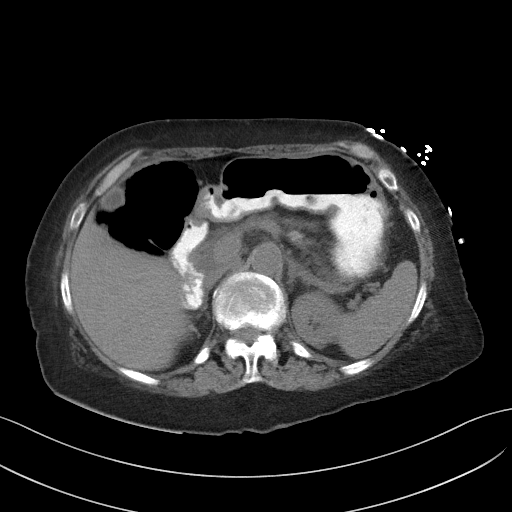
[im 85/97  soft-tissue]
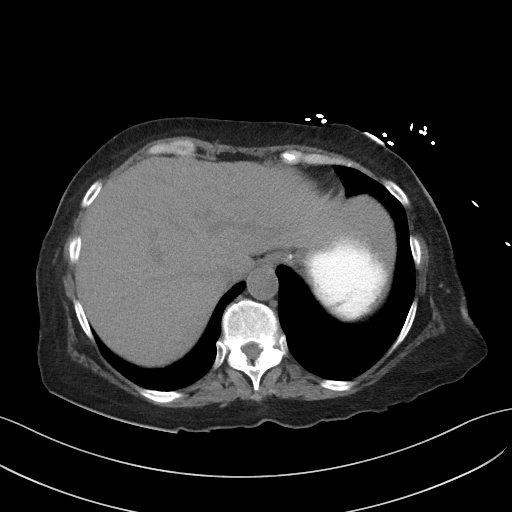
[im 91/97  soft-tissue]
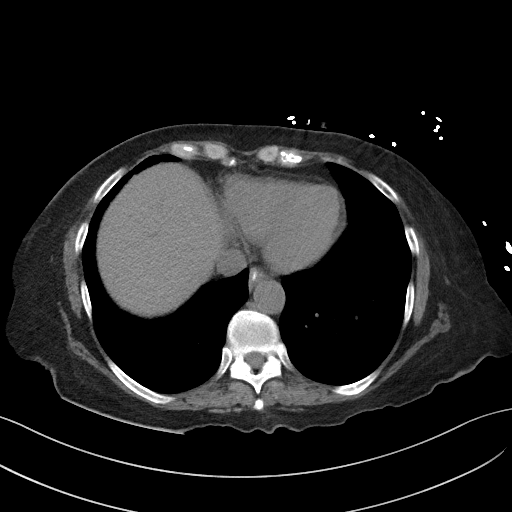

[Series 5: coronal st · coronal · 0.69mm/px · 3 of 71 slices shown]
[im 24/71  soft-tissue]
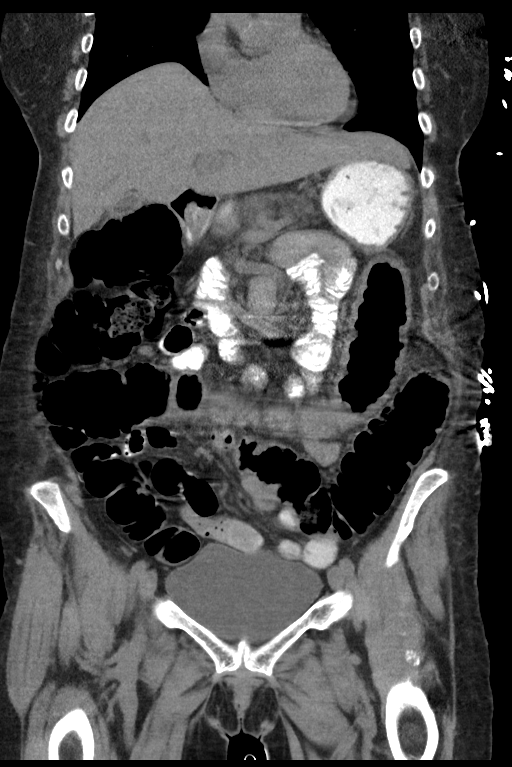
[im 32/71  soft-tissue]
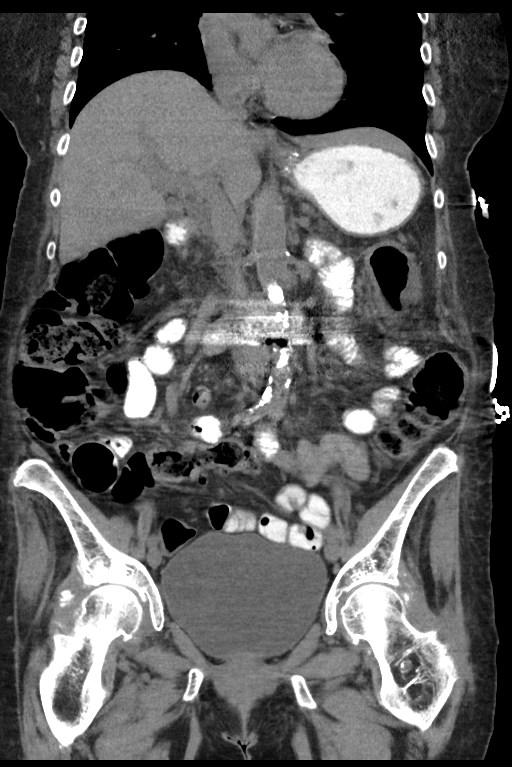
[im 39/71  soft-tissue]
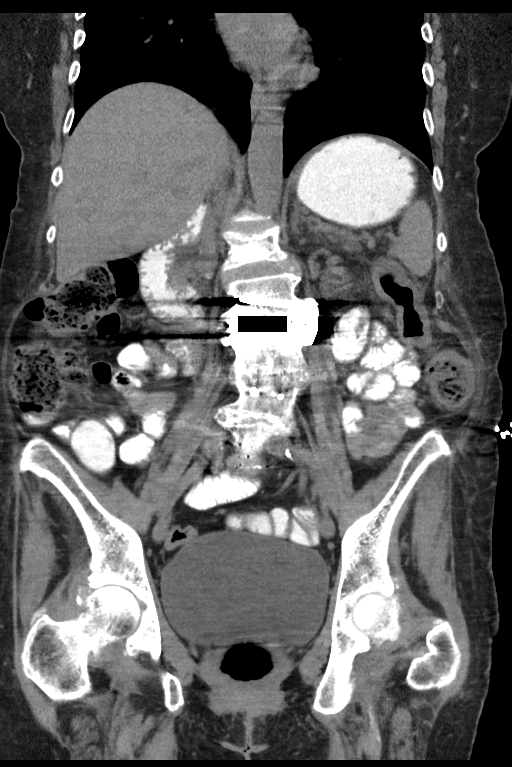

[15 of 46 positions shown; findings below may reference images not displayed]

FINDINGS: Lower chest: Visualized lung bases are clear.

Hepatobiliary: Limited noncontrast evaluation of the liver is
unremarkable. Gallbladder contracted without acute finding. Common
bile duct dilated up to 10 mm. No visible obstructive stones or
other abnormality. This is similar to previous. No appreciable
intrahepatic biliary dilatation on this noncontrast examination.

Pancreas: Diffuse fatty infiltration the pancreas noted. Pancreas
otherwise unremarkable.

Spleen: Spleen within normal limits.

Adrenals/Urinary Tract: Adrenal glands are normal. Kidneys fairly
equal in size. No nephrolithiasis or hydronephrosis. Approximate 14
mm intermediate density cystic lesion at the lower pole left kidney
is grossly similar to previous. Fat containing lesion within the
interpolar right kidney grossly stable as well. No appreciable
hydroureter bladder moderately distended without acute finding.

Stomach/Bowel: Stomach within normal limits. No evidence for bowel
obstruction. Appendix within normal limits. Mild diffuse gaseous
distension of the transverse colon. Associated wall thickening about
the transverse and proximal descending colon, suggesting possible
acute colitis. No other significant inflammatory changes about the
bowels.

Vascular/Lymphatic: Moderate aorto bi-iliac atherosclerotic disease.
No aneurysm. Shotty subcentimeter retroperitoneal lymph nodes noted.
No pathologically enlarged intra-abdominal or pelvic lymph nodes
identified.

Reproductive: Uterus is absent.  Ovaries not confidently identified.

Other: No free air or fluid.

Musculoskeletal: No acute osseous abnormality. No discrete lytic or
blastic osseous lesions. Extensive postoperative changes noted
within the lumbar spine. Patient status post vertebral augmentation
at L1.
IMPRESSION: 1. Abnormal wall thickening about the mildly dilated transverse and
proximal descending colon, suggesting acute colitis.
2. Mild extrahepatic biliary dilatation, similar relative to
previous exam. No obstructive stone or other finding identified.
Correlation with liver function tests suggested.
3. Moderate aorto bi-iliac atherosclerotic disease.

## 2020-11-15 ENCOUNTER — Inpatient Hospital Stay: Payer: Medicare Other | Attending: Internal Medicine

## 2020-11-15 ENCOUNTER — Other Ambulatory Visit: Payer: Self-pay

## 2020-11-15 ENCOUNTER — Other Ambulatory Visit: Payer: Self-pay | Admitting: Internal Medicine

## 2020-11-15 ENCOUNTER — Ambulatory Visit (HOSPITAL_COMMUNITY)
Admission: RE | Admit: 2020-11-15 | Discharge: 2020-11-15 | Disposition: A | Discharge status: Home / Self Care | Payer: Medicare Other | Source: Ambulatory Visit | Attending: Internal Medicine | Admitting: Internal Medicine

## 2020-11-15 DIAGNOSIS — N189 Chronic kidney disease, unspecified: Secondary | ICD-10-CM | POA: Insufficient documentation

## 2020-11-15 DIAGNOSIS — I130 Hypertensive heart and chronic kidney disease with heart failure and stage 1 through stage 4 chronic kidney disease, or unspecified chronic kidney disease: Secondary | ICD-10-CM | POA: Insufficient documentation

## 2020-11-15 DIAGNOSIS — C3490 Malignant neoplasm of unspecified part of unspecified bronchus or lung: Secondary | ICD-10-CM | POA: Diagnosis present

## 2020-11-15 DIAGNOSIS — Z79899 Other long term (current) drug therapy: Secondary | ICD-10-CM | POA: Insufficient documentation

## 2020-11-15 DIAGNOSIS — I509 Heart failure, unspecified: Secondary | ICD-10-CM | POA: Insufficient documentation

## 2020-11-15 DIAGNOSIS — Z85118 Personal history of other malignant neoplasm of bronchus and lung: Secondary | ICD-10-CM | POA: Insufficient documentation

## 2020-11-15 DIAGNOSIS — E039 Hypothyroidism, unspecified: Secondary | ICD-10-CM | POA: Insufficient documentation

## 2020-11-15 DIAGNOSIS — Z85820 Personal history of malignant melanoma of skin: Secondary | ICD-10-CM | POA: Insufficient documentation

## 2020-11-15 LAB — CBC WITH DIFFERENTIAL (CANCER CENTER ONLY)
Abs Immature Granulocytes: 0.03 10*3/uL (ref 0.00–0.07)
Basophils Absolute: 0.1 10*3/uL (ref 0.0–0.1)
Basophils Relative: 1 %
Eosinophils Absolute: 0 10*3/uL (ref 0.0–0.5)
Eosinophils Relative: 1 %
HCT: 35.7 % — ABNORMAL LOW (ref 36.0–46.0)
Hemoglobin: 11.8 g/dL — ABNORMAL LOW (ref 12.0–15.0)
Immature Granulocytes: 0 %
Lymphocytes Relative: 32 %
Lymphs Abs: 2.5 10*3/uL (ref 0.7–4.0)
MCH: 29.4 pg (ref 26.0–34.0)
MCHC: 33.1 g/dL (ref 30.0–36.0)
MCV: 88.8 fL (ref 80.0–100.0)
Monocytes Absolute: 0.6 10*3/uL (ref 0.1–1.0)
Monocytes Relative: 8 %
Neutro Abs: 4.4 10*3/uL (ref 1.7–7.7)
Neutrophils Relative %: 58 %
Platelet Count: 166 10*3/uL (ref 150–400)
RBC: 4.02 MIL/uL (ref 3.87–5.11)
RDW: 14 % (ref 11.5–15.5)
WBC Count: 7.6 10*3/uL (ref 4.0–10.5)
nRBC: 0 % (ref 0.0–0.2)

## 2020-11-15 LAB — CMP (CANCER CENTER ONLY)
ALT: 67 U/L — ABNORMAL HIGH (ref 0–44)
AST: 37 U/L (ref 15–41)
Albumin: 4.1 g/dL (ref 3.5–5.0)
Alkaline Phosphatase: 90 U/L (ref 38–126)
Anion gap: 9 (ref 5–15)
BUN: 23 mg/dL (ref 8–23)
CO2: 23 mmol/L (ref 22–32)
Calcium: 9.8 mg/dL (ref 8.9–10.3)
Chloride: 110 mmol/L (ref 98–111)
Creatinine: 1.5 mg/dL — ABNORMAL HIGH (ref 0.44–1.00)
GFR, Estimated: 37 mL/min — ABNORMAL LOW (ref >60)
Glucose, Bld: 106 mg/dL — ABNORMAL HIGH (ref 70–99)
Potassium: 4 mmol/L (ref 3.5–5.1)
Sodium: 142 mmol/L (ref 135–145)
Total Bilirubin: 0.6 mg/dL (ref 0.3–1.2)
Total Protein: 7.2 g/dL (ref 6.5–8.1)

## 2020-11-16 ENCOUNTER — Other Ambulatory Visit: Payer: Self-pay

## 2020-11-16 ENCOUNTER — Telehealth: Payer: Self-pay | Admitting: Internal Medicine

## 2020-11-16 ENCOUNTER — Encounter: Payer: Self-pay | Admitting: Internal Medicine

## 2020-11-16 ENCOUNTER — Inpatient Hospital Stay (HOSPITAL_BASED_OUTPATIENT_CLINIC_OR_DEPARTMENT_OTHER): Payer: Medicare Other | Admitting: Internal Medicine

## 2020-11-16 VITALS — BP 155/74 | HR 89 | Temp 97.7°F | Resp 17 | Ht 65.0 in | Wt 151.4 lb

## 2020-11-16 DIAGNOSIS — C3491 Malignant neoplasm of unspecified part of right bronchus or lung: Secondary | ICD-10-CM | POA: Diagnosis not present

## 2020-11-16 DIAGNOSIS — C3490 Malignant neoplasm of unspecified part of unspecified bronchus or lung: Secondary | ICD-10-CM | POA: Diagnosis not present

## 2020-11-16 DIAGNOSIS — E039 Hypothyroidism, unspecified: Secondary | ICD-10-CM | POA: Diagnosis not present

## 2020-11-16 DIAGNOSIS — N189 Chronic kidney disease, unspecified: Secondary | ICD-10-CM | POA: Diagnosis not present

## 2020-11-16 DIAGNOSIS — I130 Hypertensive heart and chronic kidney disease with heart failure and stage 1 through stage 4 chronic kidney disease, or unspecified chronic kidney disease: Secondary | ICD-10-CM | POA: Diagnosis not present

## 2020-11-16 DIAGNOSIS — Z85118 Personal history of other malignant neoplasm of bronchus and lung: Secondary | ICD-10-CM | POA: Diagnosis not present

## 2020-11-16 DIAGNOSIS — Z79899 Other long term (current) drug therapy: Secondary | ICD-10-CM | POA: Diagnosis not present

## 2020-11-16 DIAGNOSIS — I509 Heart failure, unspecified: Secondary | ICD-10-CM | POA: Diagnosis not present

## 2020-11-16 DIAGNOSIS — Z85820 Personal history of malignant melanoma of skin: Secondary | ICD-10-CM | POA: Diagnosis not present

## 2020-11-16 NOTE — Telephone Encounter (Signed)
Scheduled appointments per 1/25 los. Spoke to patient who is aware of appointments dates and times. Gave patient calendar print out.

## 2020-11-16 NOTE — Progress Notes (Signed)
[facility] Telephone:([phone]) [phone]   Fax:(336) [phone]  OFFICE PROGRESS NOTE  Bernerd Limbo, MD [address] [place] [address]  DIAGNOSIS: Stage IA (T1a, N0, M0) non-small cell lung cancer, well-differentiated adenocarcinoma diagnosed in November 2018.  PRIOR THERAPY:  Status post wedge resection of the right upper lobe under the care of Dr. Roxan Hockey on September 28, 2017.  CURRENT THERAPY: Observation.  INTERVAL HISTORY: [name] 70 y.o. female returns to the clinic today for follow-up visit accompanied by her sister.  The patient is feeling fine today with no concerning complaints except for mild fatigue.  She had revision of right total hip arthroplasty in October 2021.  She denied having any current chest pain, shortness of breath, cough or hemoptysis.  She denied having any fever or chills.  She has no nausea, vomiting, diarrhea or constipation.  The patient had repeat CT scan of the chest performed recently and she is here for evaluation and discussion of her risk her results.   MEDICAL HISTORY: Past Medical History:  Diagnosis Date  . Anemia   . Anxiety   . Arthritis    "everywhere" (08/19/2013)  . Bilateral carpal tunnel syndrome   . Chest pain at rest, rule out pericarditis 11/27/2012  . CHF (congestive heart failure) ([place])   . Chronic kidney disease    dr Risa Grill  . Chronic lower back pain   . Closed displaced fracture of right femoral neck ([place]) 02/18/2019  . Complication of anesthesia    "woke up twice during knee replacement" (08/19/2013)  . Depression   . Dyspnea    w/ exertion    . Fibromyalgia   . Headache(784.0)    hx of migraines   . History of pericarditis, with pericardial window in 2009 11/27/2012  . Hx of ovarian cancer 11/27/2012  . Hypothyroidism 11/27/2012  . Iron deficiency anemia   . Kidney carcinoma ([place])    "both kidneys; ~ 3 yr apart" (08/19/2013)  . Lung cancer ([place]) 2018  . Melanoma of back ([place])    . Migraines   . Pneumonia    "several times; including today", RUL/notes 08/19/2013  . Squamous carcinoma    "face, side of my nose, chest; used acid to get rid of them" (08/19/2013)  . Venous insufficiency 11/27/2012    ALLERGIES:  is allergic to amoxicillin and ampicillin.  MEDICATIONS:  Current Outpatient Medications  Medication Sig Dispense Refill  . acetaminophen (TYLENOL) 500 MG tablet Take 2 tablets (1,000 mg total) by mouth every 8 (eight) hours. 30 tablet 0  . buPROPion (WELLBUTRIN SR) 150 MG 12 hr tablet TAKE 1 TABLET BY MOUTH DAILY (Patient taking differently: Take 150 mg by mouth 2 (two) times daily. ) 30 tablet 0  . clonazePAM (KLONOPIN) 1 MG tablet Take 1 mg by mouth 3 (three) times daily as needed for anxiety.    . D-Mannose 500 MG CAPS Take 500 mg by mouth daily.    Marland Kitchen docusate sodium (COLACE) 100 MG capsule Take 1 capsule (100 mg total) by mouth 2 (two) times daily. 28 capsule 0  . ergocalciferol (VITAMIN D2) 1.25 MG (50000 UT) capsule Take 50,000 Units by mouth once a week.    . escitalopram (LEXAPRO) 10 MG tablet Take 10 mg by mouth daily.    . ferrous sulfate (FERROUSUL) 325 (65 FE) MG tablet Take 1 tablet (325 mg total) by mouth 3 (three) times daily with meals. (Patient taking differently: Take 325 mg  by mouth daily with breakfast. )  3  . levothyroxine (SYNTHROID) 75 MCG tablet Take 75 mcg by mouth daily before breakfast.    . methocarbamol (ROBAXIN) 500 MG tablet Take 1 tablet (500 mg total) by mouth every 6 (six) hours as needed for muscle spasms. 40 tablet 0  . morphine (MS CONTIN) 30 MG 12 hr tablet Take 1 tablet (30 mg total) by mouth every 12 (twelve) hours. 60 tablet 0  . Multiple Vitamin (MULTIVITAMIN WITH MINERALS) TABS tablet Take 1 tablet by mouth daily.    Marland Kitchen oxyCODONE (OXY IR/ROXICODONE) 5 MG immediate release tablet Take 1-2 tablets (5-10 mg total) by mouth every 4 (four) hours as needed for moderate pain or severe pain. 60 tablet 0  . pantoprazole  (PROTONIX) 40 MG tablet Take 40 mg by mouth daily.     . polyethylene glycol (MIRALAX / GLYCOLAX) 17 g packet Take 17 g by mouth 2 (two) times daily. 28 packet 0  . psyllium (METAMUCIL) 58.6 % packet Take 1 packet by mouth 2 (two) times daily.     Marland Kitchen triamterene-hydrochlorothiazide (MAXZIDE) 75-50 MG tablet Take 1 tablet by mouth daily.    Marland Kitchen zolpidem (AMBIEN) 5 MG tablet Take 5 mg by mouth at bedtime.      No current facility-administered medications for this visit.    SURGICAL HISTORY:  Past Surgical History:  Procedure Laterality Date  . ABDOMINAL HYSTERECTOMY  1979  . ANTERIOR LAT LUMBAR FUSION Left 07/16/2018   Procedure: Left Lumbar two-three  Anterolateral decompression/interbody fusion with lateral plate fixation;  Surgeon: Kristeen Miss, MD;  Location: [facility];  Service: Neurosurgery;  Laterality: Left;  . APPENDECTOMY    . COLON RESECTION  07/2019  . DOPPLER ECHOCARDIOGRAPHY  02/23/2011   LVEF>50%  . FRACTURE SURGERY    . KNEE ARTHROSCOPY Right    "twice before the replacement" (08/19/2013)  . LEFT OOPHORECTOMY Left 1979  . [place]   "ruptured disc"  . MELANOMA EXCISION  ~ 2010   "my lower back" (08/19/2013)  . ORIF HIP FRACTURE Left    x 2 age 77 and 4  . PARTIAL NEPHRECTOMY Right 2005; ~ 2008  . PERICARDIAL WINDOW  ~ 2012  . POSTERIOR LUMBAR FUSION  2002?; 03/2012  . REDUCTION MAMMAPLASTY Bilateral ~ 2008  . RIGHT OOPHORECTOMY  ~ 2007   "it had cancer in it" (08/19/2013)  . SHOULDER ARTHROSCOPY W/ ROTATOR CUFF REPAIR Right 1990's  . TONSILLECTOMY  1954  . TOTAL HIP ARTHROPLASTY Right 02/18/2019   Procedure: TOTAL HIP ARTHROPLASTY;  Surgeon: Marchia Bond, MD;  Location: [facility];  Service: Orthopedics;  Laterality: Right;  . TOTAL HIP REVISION Right 03/27/2019   Procedure: TOTAL HIP REVISION;  Surgeon: Paralee Cancel, MD;  Location: [facility];  Service: Orthopedics;  Laterality: Right;  . TOTAL HIP REVISION Right 08/12/2020   Procedure: RIGHT TOTAL HIP REVISION  TO CONSTRAINED LINER;  Surgeon: Paralee Cancel, MD;  Location: [facility];  Service: Orthopedics;  Laterality: Right;  90 mins  . TOTAL KNEE ARTHROPLASTY Right 1990's  . TOTAL KNEE ARTHROPLASTY Left 08/02/2015   Procedure: LEFT TOTAL KNEE ARTHROPLASTY;  Surgeon: Paralee Cancel, MD;  Location: [facility];  Service: Orthopedics;  Laterality: Left;  . TOTAL SHOULDER REPLACEMENT Left 2006?  . TUBAL LIGATION  1952  . VIDEO ASSISTED THORACOSCOPY (VATS)/WEDGE RESECTION Right 09/28/2017   Procedure: RIGHT VIDEO ASSISTED THORACOSCOPY (VATS)/WEDGE RESECTION, LYMPH NODE DISSECTION ;  Surgeon: Melrose Nakayama, MD;  Location: [facility];  Service: Thoracic;  Laterality: Right;    REVIEW OF SYSTEMS:  A comprehensive review of systems was negative.   PHYSICAL EXAMINATION: General appearance: alert, cooperative and no distress Head: Normocephalic, without obvious abnormality, atraumatic Neck: no adenopathy, no JVD, supple, symmetrical, trachea midline and thyroid not enlarged, symmetric, no tenderness/mass/nodules Lymph nodes: Cervical, supraclavicular, and axillary nodes normal. Resp: clear to auscultation bilaterally Back: symmetric, no curvature. ROM normal. No CVA tenderness. Cardio: regular rate and rhythm, S1, S2 normal, no murmur, click, rub or gallop GI: soft, non-tender; bowel sounds normal; no masses,  no organomegaly Extremities: extremities normal, atraumatic, no cyanosis or edema  ECOG PERFORMANCE STATUS: 1 - Symptomatic but completely ambulatory  Blood pressure (!) 155/74, pulse 89, temperature 97.7 F (36.5 C), temperature source Tympanic, resp. rate 17, height 5\' 5"  (1.651 m), weight 151 lb 6.4 oz (68.7 kg), SpO2 100 %.  LABORATORY DATA: Lab Results  Component Value Date   WBC 7.6 11/15/2020   HGB 11.8 (L) 11/15/2020   HCT 35.7 (L) 11/15/2020   MCV 88.8 11/15/2020   PLT 166 11/15/2020      Chemistry      Component Value Date/Time   NA 142 11/15/2020 1145   NA 144 03/13/2018 1427   K  4.0 11/15/2020 1145   CL 110 11/15/2020 1145   CO2 23 11/15/2020 1145   BUN 23 11/15/2020 1145   BUN 42 (H) 03/13/2018 1427   CREATININE 1.50 (H) 11/15/2020 1145   CREATININE 1.33 (H) 09/02/2015 1220      Component Value Date/Time   CALCIUM 9.8 11/15/2020 1145   ALKPHOS 90 11/15/2020 1145   AST 37 11/15/2020 1145   ALT 67 (H) 11/15/2020 1145   BILITOT 0.6 11/15/2020 1145       RADIOGRAPHIC STUDIES: CT CHEST WO CONTRAST  Result Date: 11/15/2020 CLINICAL DATA:  Non-small cell lung cancer staging in this 70 year old female also with reported history of kidney cancer. EXAM: CT CHEST WITHOUT CONTRAST TECHNIQUE: Multidetector CT imaging of the chest was performed following the standard protocol without IV contrast. COMPARISON:  November 11, 2019 FINDINGS: Cardiovascular: Calcified atheromatous plaque in the thoracic aorta. No aneurysmal dilation. Normal caliber central pulmonary vasculature. Normal heart size. No pericardial effusion. Limited assessment of cardiovascular structures given lack of intravenous contrast. Mediastinum/Nodes: Distortion of RIGHT hilar structures due to partial lung resection in the RIGHT chest as before. No mediastinal adenopathy. No axillary lymphadenopathy. No thoracic inlet adenopathy. Streak artifact in the LEFT axilla limiting assessment in this location. Esophagus grossly normal. Lungs/Pleura: Post partial lung resection in the RIGHT upper lobe as before. No thickening along the suture line. Tree-in-bud opacities and small nodules in the RIGHT middle lobe without change. Tiny 5 mm nodule in the RIGHT lower lobe on image 98 of series 7 is also unchanged. No consolidation. No pleural effusion. Airways are patent. Upper Abdomen: Incidental imaging of upper abdominal contents without acute process. Imaged portions of liver, spleen and adrenal glands are normal. Fatty atrophy of the pancreas without change Musculoskeletal: No acute musculoskeletal process. Signs of previous  cement augmentation of the L1 vertebral body post compression fracture with similar appearance. Post LEFT shoulder arthroplasty also unchanged. IMPRESSION: 1. Post partial lung resection in the RIGHT upper lobe as before. No evidence of recurrent or metastatic disease. 2. Scattered small nodules are stable.  Attention on follow-up. 3. Aortic atherosclerosis. Aortic Atherosclerosis (ICD10-I70.0). Electronically Signed   By: Zetta Bills M.D.   On: 11/15/2020 14:48    ASSESSMENT AND PLAN:  This is a very pleasant 70 years old white female with history of a stage IA non-small cell lung cancer, adenocarcinoma status post wedge resection of the right upper lobe in December 2018.   She has been in observation for the last 3 years and feeling fine with no concerning complaints. She had repeat CT scan of the chest performed recently.  I personally and independently reviewed the scans and discussed the results with the patient and her sister. Her scan showed no concerning findings for disease recurrence or metastasis. I recommended for her to continue on observation with repeat CT scan of the chest in 1 year. For the hypertension and renal insufficiency she was advised to follow-up with her primary care physician for management and further recommendation. The patient was advised to call immediately if she has any concerning symptoms in the interval. The patient voices understanding of current disease status and treatment options and is in agreement with the current care plan. All questions were answered. The patient knows to call the clinic with any problems, questions or concerns. We can certainly see the patient much sooner if necessary.   Disclaimer: This note was dictated with voice recognition software. Similar sounding words can inadvertently be transcribed and may not be corrected upon review.

## 2021-02-23 ENCOUNTER — Ambulatory Visit (INDEPENDENT_AMBULATORY_CARE_PROVIDER_SITE_OTHER): Payer: Medicare Other

## 2021-02-23 ENCOUNTER — Encounter: Payer: Self-pay | Admitting: Emergency Medicine

## 2021-02-23 ENCOUNTER — Other Ambulatory Visit: Payer: Self-pay

## 2021-02-23 ENCOUNTER — Ambulatory Visit (INDEPENDENT_AMBULATORY_CARE_PROVIDER_SITE_OTHER): Payer: Medicare Other | Admitting: Emergency Medicine

## 2021-02-23 DIAGNOSIS — R0602 Shortness of breath: Secondary | ICD-10-CM

## 2021-02-23 DIAGNOSIS — R9389 Abnormal findings on diagnostic imaging of other specified body structures: Secondary | ICD-10-CM | POA: Diagnosis not present

## 2021-02-23 DIAGNOSIS — C3491 Malignant neoplasm of unspecified part of right bronchus or lung: Secondary | ICD-10-CM

## 2021-02-23 LAB — CBC WITH DIFFERENTIAL/PLATELET
Basophils Absolute: 0.1 10*3/uL (ref 0.0–0.1)
Basophils Relative: 0.9 % (ref 0.0–3.0)
Eosinophils Absolute: 0.3 10*3/uL (ref 0.0–0.7)
Eosinophils Relative: 4.4 % (ref 0.0–5.0)
HCT: 34.2 % — ABNORMAL LOW (ref 36.0–46.0)
Hemoglobin: 11.5 g/dL — ABNORMAL LOW (ref 12.0–15.0)
Lymphocytes Relative: 27.3 % (ref 12.0–46.0)
Lymphs Abs: 1.8 10*3/uL (ref 0.7–4.0)
MCHC: 33.6 g/dL (ref 30.0–36.0)
MCV: 90.1 fl (ref 78.0–100.0)
Monocytes Absolute: 0.5 10*3/uL (ref 0.1–1.0)
Monocytes Relative: 8.2 % (ref 3.0–12.0)
Neutro Abs: 3.9 10*3/uL (ref 1.4–7.7)
Neutrophils Relative %: 59.2 % (ref 43.0–77.0)
Platelets: 167 10*3/uL (ref 150.0–400.0)
RBC: 3.8 Mil/uL — ABNORMAL LOW (ref 3.87–5.11)
RDW: 14.5 % (ref 11.5–15.5)
WBC: 6.6 10*3/uL (ref 4.0–10.5)

## 2021-02-23 LAB — TSH: TSH: 1.63 u[IU]/mL (ref 0.35–4.50)

## 2021-02-23 NOTE — Patient Instructions (Addendum)
We will arrange for pulmonary function testing We will perform an echocardiogram  Walking oximetry on room air at your next visit.  CXR today Blood work today Follow with Dr. Lamonte Sakai next available with full pulmonary function testing on the same day.

## 2021-02-23 NOTE — Progress Notes (Signed)
Subjective:    Patient ID: [name], female    DOB: 02-19-1951, 70 y.o.   MRN: [id number]  HPI  70 year old former smoker (27 pk-yrs) with a history of ovarian cancer, stage Ia adenocarcinoma post right upper lobe wedge resection, chronic pain, chronic kidney disease, depression, fibromyalgia, migraines, prior pericardial window (2009), history of right hip arthroplasty and hip dislocations, total colectomy for strictures.  She is been on observation for her lung cancer for 3 years.  No evidence of recurrence.  She is here today to discuss progressive exertional SOB, occasionally can happen at rest. No associated sx, no wheeze, cough, pain. She wonders whether there may be an anxiety component.   Pulmonary function testing 08/24/2017 reviewed by me, shows normal spirometry, normal lung volumes and diffusion.  Review of Systems As per [facility]  Past Medical History:  Diagnosis Date  . Anemia   . Anxiety   . Arthritis    "everywhere" (08/19/2013)  . Bilateral carpal tunnel syndrome   . Chest pain at rest, rule out pericarditis 11/27/2012  . CHF (congestive heart failure) ([place])   . Chronic kidney disease    dr Risa Grill  . Chronic lower back pain   . Closed displaced fracture of right femoral neck ([place]) 02/18/2019  . Complication of anesthesia    "woke up twice during knee replacement" (08/19/2013)  . Depression   . Dyspnea    w/ exertion    . Fibromyalgia   . Headache(784.0)    hx of migraines   . History of pericarditis, with pericardial window in 2009 11/27/2012  . Hx of ovarian cancer 11/27/2012  . Hypothyroidism 11/27/2012  . Iron deficiency anemia   . Kidney carcinoma ([place])    "both kidneys; ~ 3 yr apart" (08/19/2013)  . Lung cancer ([place]) 2018  . Melanoma of back ([place])   . Migraines   . Pneumonia    "several times; including today", RUL/notes 08/19/2013  . Squamous carcinoma    "face, side of my nose, chest; used acid to get rid of them" (08/19/2013)  . Venous insufficiency  11/27/2012     Family History  Problem Relation Age of Onset  . Cancer Mother 49       Stomach  . Cancer Father 54       Lung cancer  . Bipolar disorder Sister      Social History   Socioeconomic History  . Marital status: Married    Spouse name: Not on file  . Number of children: 2  . Years of education: Not on file  . Highest education level: Not on file  Occupational History  . Not on file  Tobacco Use  . Smoking status: Former Smoker    Packs/day: 1.50    Years: 18.00    Pack years: 27.00    Types: Cigarettes    Quit date: 09/22/1985    Years since quitting: 35.4  . Smokeless tobacco: Never Used  Vaping Use  . Vaping Use: Never used  Substance and Sexual Activity  . Alcohol use: No  . Drug use: No    Comment: prescribed  . Sexual activity: Not Currently  Other Topics Concern  . Not on file  Social History Narrative   Married.  Lives with husband.  Two children one boy and one girl.  Three granddaughters and two great granddaughters.    Social Determinants of Health   Financial Resource Strain: Not on file  Food Insecurity: Not on file  Transportation Needs: Not  on file  Physical Activity: Not on file  Stress: Not on file  Social Connections: Not on file  Intimate Partner Violence: Not on file     Allergies  Allergen Reactions  . Amoxicillin Other (See Comments)    UNSPECIFIED REACTION  Has patient had a PCN reaction causing immediate rash, facial/tongue/throat swelling, SOB or lightheadedness with hypotension: No Has patient had a PCN reaction causing severe rash involving mucus membranes or skin necrosis: No Has patient had a PCN reaction that required hospitalization No Has patient had a PCN reaction occurring within the last 10 years: No If all of the above answers are "NO", then may proceed with Cephalosporin use.  . Ampicillin Rash and Other (See Comments)    Has patient had a PCN reaction causing immediate rash, facial/tongue/throat swelling, SOB  or lightheadedness with hypotension: No Has patient had a PCN reaction causing severe rash involving mucus membranes or skin necrosis: No Has patient had a PCN reaction that required hospitalization No Has patient had a PCN reaction occurring within the last 10 years: No If all of the above answers are "NO", then may proceed with Cephalosporin use.     Outpatient Medications Prior to Visit  Medication Sig Dispense Refill  . acetaminophen (TYLENOL) 500 MG tablet Take 2 tablets (1,000 mg total) by mouth every 8 (eight) hours. 30 tablet 0  . buPROPion (WELLBUTRIN SR) 150 MG 12 hr tablet TAKE 1 TABLET BY MOUTH DAILY (Patient taking differently: Take 150 mg by mouth 2 (two) times daily.) 30 tablet 0  . clonazePAM (KLONOPIN) 1 MG tablet Take 1 mg by mouth 3 (three) times daily as needed for anxiety.    . D-Mannose 500 MG CAPS Take 500 mg by mouth daily.    Marland Kitchen docusate sodium (COLACE) 100 MG capsule Take 1 capsule (100 mg total) by mouth 2 (two) times daily. 28 capsule 0  . ergocalciferol (VITAMIN D2) 1.25 MG (50000 UT) capsule Take 50,000 Units by mouth once a week.    . escitalopram (LEXAPRO) 10 MG tablet Take 10 mg by mouth daily.    . ferrous sulfate (FERROUSUL) 325 (65 FE) MG tablet Take 1 tablet (325 mg total) by mouth 3 (three) times daily with meals. (Patient taking differently: Take 325 mg by mouth daily with breakfast.)  3  . levothyroxine (SYNTHROID) 75 MCG tablet Take 75 mcg by mouth daily before breakfast.    . methocarbamol (ROBAXIN) 500 MG tablet Take 1 tablet (500 mg total) by mouth every 6 (six) hours as needed for muscle spasms. 40 tablet 0  . morphine (MS CONTIN) 30 MG 12 hr tablet Take 1 tablet (30 mg total) by mouth every 12 (twelve) hours. 60 tablet 0  . Multiple Vitamin (MULTIVITAMIN WITH MINERALS) TABS tablet Take 1 tablet by mouth daily.    Marland Kitchen oxyCODONE (OXY IR/ROXICODONE) 5 MG immediate release tablet Take 1-2 tablets (5-10 mg total) by mouth every 4 (four) hours as needed for  moderate pain or severe pain. 60 tablet 0  . pantoprazole (PROTONIX) 40 MG tablet Take 40 mg by mouth daily.    . polyethylene glycol (MIRALAX / GLYCOLAX) 17 g packet Take 17 g by mouth 2 (two) times daily. 28 packet 0  . psyllium (METAMUCIL) 58.6 % packet Take 1 packet by mouth 2 (two) times daily.     Marland Kitchen triamterene-hydrochlorothiazide (MAXZIDE) 75-50 MG tablet Take 1 tablet by mouth daily.    Marland Kitchen zolpidem (AMBIEN) 5 MG tablet Take 5 mg by mouth at  bedtime.      No facility-administered medications prior to visit.         Objective:   Physical Exam Vitals:   02/23/21 1428  BP: 130/80  Pulse: 97  Temp: 98.4 F (36.9 C)  TempSrc: Temporal  SpO2: 99%  Weight: 139 lb 12.8 oz (63.4 kg)  Height: 5\' 5"  (1.651 m)   Gen: Pleasant, well-nourished, in no distress,  normal affect  ENT: No lesions,  mouth clear,  oropharynx clear, no postnasal drip, edentulous  Neck: No JVD, no stridor  Lungs: No use of accessory muscles, no crackles or wheezing on normal respiration, no wheeze on forced expiration  Cardiovascular: RRR, heart sounds normal, no murmur or gallops, no peripheral edema  Musculoskeletal: No deformities, no cyanosis or clubbing  Neuro: alert, awake, non focal  Skin: Warm, no lesions or rash      Assessment & Plan:  Adenocarcinoma of right lung, stage 1 (HCC) Surveillance CTs.  Last was in January, reassuring  Dyspnea Etiology unclear.  She was a former smoker and lost some of her lung capacity when she had her wedge resection.  She needs repeat pulmonary function testing.  The differential diagnosis is broad so we will check a screening echocardiogram, TSH, CBC.  Follow-up to review together.  We will perform a walking oximetry at her next office visit to ensure no evidence of occult desaturation.  Baltazar Apo, MD, PhD 02/23/2021, 2:50 PM [facility] [phone] or if no answer before 7:00PM call [phone] For any issues after 7:00PM  please call eLink [phone]

## 2021-02-23 NOTE — Assessment & Plan Note (Signed)
Etiology unclear.  She was a former smoker and lost some of her lung capacity when she had her wedge resection.  She needs repeat pulmonary function testing.  The differential diagnosis is broad so we will check a screening echocardiogram, TSH, CBC.  Follow-up to review together.  We will perform a walking oximetry at her next office visit to ensure no evidence of occult desaturation.

## 2021-02-23 NOTE — Assessment & Plan Note (Signed)
Surveillance CTs.  Last was in January, reassuring

## 2021-02-24 NOTE — Addendum Note (Signed)
Addended by: Gavin Potters R on: 02/24/2021 09:08 AM   Modules accepted: Orders

## 2021-02-28 ENCOUNTER — Encounter: Payer: Self-pay | Admitting: *Deleted

## 2021-03-25 ENCOUNTER — Other Ambulatory Visit (HOSPITAL_COMMUNITY): Payer: Medicare Other

## 2021-03-25 ENCOUNTER — Encounter (HOSPITAL_COMMUNITY): Payer: Self-pay | Admitting: Emergency Medicine

## 2021-03-28 ENCOUNTER — Other Ambulatory Visit (HOSPITAL_COMMUNITY): Payer: Medicare Other

## 2021-03-31 ENCOUNTER — Ambulatory Visit: Payer: Medicare Other | Admitting: Emergency Medicine

## 2021-04-01 ENCOUNTER — Other Ambulatory Visit (HOSPITAL_COMMUNITY): Payer: Medicare Other

## 2021-04-04 ENCOUNTER — Telehealth (HOSPITAL_COMMUNITY): Payer: Self-pay | Admitting: Emergency Medicine

## 2021-04-04 NOTE — Telephone Encounter (Signed)
Thank you :)

## 2021-04-04 NOTE — Telephone Encounter (Signed)
Just an FYI. We have made several attempts to contact this patient including sending a letter to schedule or reschedule their echocardiogram. We will be removing the patient from the echo [place].  03/25/21 NO SHOWED -MAILED LETTER [facility]  NO PA# needed  02/24/21 [facility] to schedule @ 10:15/[facility]   Thank you

## 2021-04-09 ENCOUNTER — Encounter (HOSPITAL_COMMUNITY): Payer: Self-pay | Admitting: *Deleted

## 2021-04-09 ENCOUNTER — Other Ambulatory Visit: Payer: Self-pay

## 2021-04-09 ENCOUNTER — Emergency Department (HOSPITAL_COMMUNITY): Payer: Medicare Other

## 2021-04-09 ENCOUNTER — Emergency Department (HOSPITAL_COMMUNITY)
Admission: EM | Admit: 2021-04-09 | Discharge: 2021-04-09 | Disposition: A | Discharge status: Home / Self Care | Payer: Medicare Other | Attending: Emergency Medicine | Admitting: Emergency Medicine

## 2021-04-09 DIAGNOSIS — Y9301 Activity, walking, marching and hiking: Secondary | ICD-10-CM | POA: Insufficient documentation

## 2021-04-09 DIAGNOSIS — S299XXA Unspecified injury of thorax, initial encounter: Secondary | ICD-10-CM | POA: Insufficient documentation

## 2021-04-09 DIAGNOSIS — S51012A Laceration without foreign body of left elbow, initial encounter: Secondary | ICD-10-CM | POA: Insufficient documentation

## 2021-04-09 DIAGNOSIS — W19XXXA Unspecified fall, initial encounter: Secondary | ICD-10-CM | POA: Insufficient documentation

## 2021-04-09 DIAGNOSIS — R42 Dizziness and giddiness: Secondary | ICD-10-CM | POA: Diagnosis not present

## 2021-04-09 DIAGNOSIS — M25532 Pain in left wrist: Secondary | ICD-10-CM | POA: Insufficient documentation

## 2021-04-09 DIAGNOSIS — Z5321 Procedure and treatment not carried out due to patient leaving prior to being seen by health care provider: Secondary | ICD-10-CM | POA: Diagnosis not present

## 2021-04-09 MED ORDER — HYDROCODONE-ACETAMINOPHEN 5-325 MG PO TABS
1.0000 | ORAL_TABLET | Freq: Once | ORAL | Status: AC
  Administered 2021-04-09: 1 via ORAL
  Filled 2021-04-09: qty 1

## 2021-04-09 MED ORDER — LIDOCAINE HCL (PF) 1 % IJ SOLN: 5.0000 mL | Freq: Once | INTRAMUSCULAR | Status: DC

## 2021-04-09 NOTE — ED Provider Notes (Signed)
Emergency Medicine Provider Triage Evaluation Note  [name] , a 70 y.o. female  was evaluated in triage.  Pt complains of injuries from a fall.  Patient states that she had a fall yesterday where she was walking in the house and lost her balance and fell into the corner of a wall injuring her left ribs.  No loss of consciousness with that event.  Patient states today she was walking and she felt dizzy and fell landing on her left elbow also with pain in her left wrist.  Laceration of the left elbow.  Not on blood thinners.  Denies loss of consciousness.  States that her neck hurts as well and that she has a history of osteoporosis..  Review of Systems  Positive: Elbow and wrist pain, left elbow laceration, dizziness, neck pain Negative: Syncope, chest pain  Physical Exam  There were no vitals taken for this visit. Gen:   Awake, no distress   Resp:  Normal effort  MSK:   Normal gait, does have tenderness with laceration to left lateral elbow.  Left wrist pain.  DP pulse present, sensation intact. Other:  Left chest wall tenderness.  Abdomen is soft and nontender.  Medical Decision Making  Medically screening exam initiated at 6:47 PM.  Appropriate orders placed.  [name] was informed that the remainder of the evaluation will be completed by another provider, this initial triage assessment does not replace that evaluation, and the importance of remaining in the ED until their evaluation is complete.     Tacy Learn, PA-C 04/09/21 1847    Luna Fuse, MD 04/10/21 [phone]

## 2021-04-09 NOTE — ED Triage Notes (Signed)
Pt felt this afternoon, falling on her left elbow, she has laceration to left elbow and is concerned it is broken.

## 2021-11-11 ENCOUNTER — Inpatient Hospital Stay: Payer: Medicare Other | Attending: Internal Medicine

## 2021-11-11 ENCOUNTER — Ambulatory Visit (HOSPITAL_COMMUNITY)
Admission: RE | Admit: 2021-11-11 | Discharge: 2021-11-11 | Disposition: A | Discharge status: Home / Self Care | Payer: Medicare Other | Source: Ambulatory Visit | Attending: Internal Medicine | Admitting: Internal Medicine

## 2021-11-11 ENCOUNTER — Other Ambulatory Visit: Payer: Self-pay

## 2021-11-11 DIAGNOSIS — C3490 Malignant neoplasm of unspecified part of unspecified bronchus or lung: Secondary | ICD-10-CM | POA: Diagnosis not present

## 2021-11-11 LAB — CBC WITH DIFFERENTIAL (CANCER CENTER ONLY)
Abs Immature Granulocytes: 0.01 10*3/uL (ref 0.00–0.07)
Basophils Absolute: 0 10*3/uL (ref 0.0–0.1)
Basophils Relative: 1 %
Eosinophils Absolute: 0.1 10*3/uL (ref 0.0–0.5)
Eosinophils Relative: 1 %
HCT: 32.5 % — ABNORMAL LOW (ref 36.0–46.0)
Hemoglobin: 10.9 g/dL — ABNORMAL LOW (ref 12.0–15.0)
Immature Granulocytes: 0 %
Lymphocytes Relative: 32 %
Lymphs Abs: 1.8 10*3/uL (ref 0.7–4.0)
MCH: 30.4 pg (ref 26.0–34.0)
MCHC: 33.5 g/dL (ref 30.0–36.0)
MCV: 90.8 fL (ref 80.0–100.0)
Monocytes Absolute: 0.3 10*3/uL (ref 0.1–1.0)
Monocytes Relative: 6 %
Neutro Abs: 3.4 10*3/uL (ref 1.7–7.7)
Neutrophils Relative %: 60 %
Platelet Count: 137 10*3/uL — ABNORMAL LOW (ref 150–400)
RBC: 3.58 MIL/uL — ABNORMAL LOW (ref 3.87–5.11)
RDW: 12.6 % (ref 11.5–15.5)
WBC Count: 5.7 10*3/uL (ref 4.0–10.5)
nRBC: 0 % (ref 0.0–0.2)

## 2021-11-11 LAB — CMP (CANCER CENTER ONLY)
ALT: 15 U/L (ref 0–44)
AST: 21 U/L (ref 15–41)
Albumin: 4.3 g/dL (ref 3.5–5.0)
Alkaline Phosphatase: 68 U/L (ref 38–126)
Anion gap: 8 (ref 5–15)
BUN: 35 mg/dL — ABNORMAL HIGH (ref 8–23)
CO2: 26 mmol/L (ref 22–32)
Calcium: 10.4 mg/dL — ABNORMAL HIGH (ref 8.9–10.3)
Chloride: 105 mmol/L (ref 98–111)
Creatinine: 1.56 mg/dL — ABNORMAL HIGH (ref 0.44–1.00)
GFR, Estimated: 35 mL/min — ABNORMAL LOW (ref >60)
Glucose, Bld: 83 mg/dL (ref 70–99)
Potassium: 3.9 mmol/L (ref 3.5–5.1)
Sodium: 139 mmol/L (ref 135–145)
Total Bilirubin: 0.5 mg/dL (ref 0.3–1.2)
Total Protein: 7.3 g/dL (ref 6.5–8.1)

## 2021-11-14 ENCOUNTER — Ambulatory Visit: Payer: Medicare Other | Admitting: Internal Medicine

## 2021-11-17 NOTE — Progress Notes (Signed)
[facility] OFFICE PROGRESS NOTE  Bernerd Limbo, MD [address] [place] [address]  DIAGNOSIS: Stage IA (T1a, N0, M0) non-small cell lung cancer, well-differentiated adenocarcinoma diagnosed in November 2018.  PRIOR THERAPY:  Status post wedge resection of the right upper lobe under the care of Dr. Roxan Hockey on September 28, 2017.  CURRENT THERAPY: Observation  INTERVAL HISTORY: [name] 70 y.o. female returns to the clinic today for a follow-up visit. The patient was last seen 1 year ago.  The patient has been on observation since her surgery in December 2018.  She is feeling fairly well today without any concerning complaints except concerns related to her insomnia for which she has been on Ambien for years, prescribed by her PCP. She also has orthopedic problems and had a joint replacement this year.  She denies any fever, chills, night sweats, or unexplained weight loss.  Denies any chest pain, cough, or hemoptysis. She sometimes has intermittent periods of shortness of breath that come and goes.  She denies any nausea, vomiting, diarrhea, or constipation.  She denies any headaches. She notes she is going to see her eye doctor soon regarding visual changes. She has chronic anemia since she was a teenager. She takes iron supplements. The patient recently had a restaging CT scan performed.  She is here today for evaluation to review her scan results.     MEDICAL HISTORY: Past Medical History:  Diagnosis Date   Anemia    Anxiety    Arthritis    "everywhere" (08/19/2013)   Bilateral carpal tunnel syndrome    Chest pain at rest, rule out pericarditis 11/27/2012   CHF (congestive heart failure) ([place])    Chronic kidney disease    dr Risa Grill   Chronic lower back pain    Closed displaced fracture of right femoral neck ([place]) 62/69/4854   Complication of anesthesia    "woke up twice during knee replacement" (08/19/2013)   Depression    Dyspnea     w/ exertion     Fibromyalgia    Headache(784.0)    hx of migraines    History of pericarditis, with pericardial window in 2009 11/27/2012   Hx of ovarian cancer 11/27/2012   Hypothyroidism 11/27/2012   Iron deficiency anemia    Kidney carcinoma ([place])    "both kidneys; ~ 3 yr apart" (08/19/2013)   Lung cancer ([place]) 2018   Melanoma of back ([place])    Migraines    Pneumonia    "several times; including today", RUL/notes 08/19/2013   Squamous carcinoma    "face, side of my nose, chest; used acid to get rid of them" (08/19/2013)   Venous insufficiency 11/27/2012    ALLERGIES:  is allergic to amoxicillin and ampicillin.  MEDICATIONS:     SURGICAL HISTORY:  Past Surgical History:  Procedure Laterality Date   ABDOMINAL HYSTERECTOMY  1979   ANTERIOR LAT LUMBAR FUSION Left 07/16/2018   Procedure: Left Lumbar two-three  Anterolateral decompression/interbody fusion with lateral plate fixation;  Surgeon: Kristeen Miss, MD;  Location: [facility];  Service: Neurosurgery;  Laterality: Left;   APPENDECTOMY     COLON RESECTION  07/2019   DOPPLER ECHOCARDIOGRAPHY  02/23/2011   LVEF>50%   FRACTURE SURGERY     KNEE ARTHROSCOPY Right    "twice before the replacement" (08/19/2013)   LEFT OOPHORECTOMY Left [place]   "ruptured disc"   MELANOMA EXCISION  ~ 2010   "my lower  back" (08/19/2013)   ORIF HIP FRACTURE Left    x 2 age 14 and 4   PARTIAL NEPHRECTOMY Right 2005; ~ 2008   PERICARDIAL WINDOW  ~ 2012   [place]  2002?; 03/2012   REDUCTION MAMMAPLASTY Bilateral ~ 2008   RIGHT OOPHORECTOMY  ~ 2007   "it had cancer in it" (08/19/2013)   SHOULDER ARTHROSCOPY W/ ROTATOR CUFF REPAIR Right 1990's   TONSILLECTOMY  1954   TOTAL HIP ARTHROPLASTY Right 02/18/2019   Procedure: TOTAL HIP ARTHROPLASTY;  Surgeon: Marchia Bond, MD;  Location: [facility];  Service: Orthopedics;  Laterality: Right;   TOTAL HIP REVISION Right 03/27/2019   Procedure: TOTAL HIP REVISION;  Surgeon: Paralee Cancel, MD;  Location: [facility];  Service: Orthopedics;  Laterality: Right;   TOTAL HIP REVISION Right 08/12/2020   Procedure: RIGHT TOTAL HIP REVISION TO CONSTRAINED LINER;  Surgeon: Paralee Cancel, MD;  Location: [facility];  Service: Orthopedics;  Laterality: Right;  90 mins   TOTAL KNEE ARTHROPLASTY Right 1990's   TOTAL KNEE ARTHROPLASTY Left 08/02/2015   Procedure: LEFT TOTAL KNEE ARTHROPLASTY;  Surgeon: Paralee Cancel, MD;  Location: [facility];  Service: Orthopedics;  Laterality: Left;   TOTAL SHOULDER REPLACEMENT Left 2006?   TUBAL LIGATION  1952   VIDEO ASSISTED THORACOSCOPY (VATS)/WEDGE RESECTION Right 09/28/2017   Procedure: RIGHT VIDEO ASSISTED THORACOSCOPY (VATS)/WEDGE RESECTION, LYMPH NODE DISSECTION ;  Surgeon: Melrose Nakayama, MD;  Location: [facility];  Service: Thoracic;  Laterality: Right;    REVIEW OF SYSTEMS:   Review of Systems  Constitutional: Positive for fatigue. Negative for appetite change, chills, fever and unexpected weight change.  HENT:   Negative for mouth sores, nosebleeds, sore throat and trouble swallowing.   Eyes: Negative for eye problems and icterus.  Respiratory: Positive for intermittent shortness of breath. Negative for cough, hemoptysis, and wheezing.   Cardiovascular: Negative for chest pain and leg swelling.  Gastrointestinal: Negative for abdominal pain, constipation, diarrhea, nausea and vomiting.  Genitourinary: Negative for bladder incontinence, difficulty urinating, dysuria, frequency and hematuria.   Musculoskeletal: Positive for chronic arthralgias. Negative for gait problem, neck pain and neck stiffness.  Skin: Negative for itching and rash.  Neurological: Negative for dizziness, extremity weakness, gait problem, headaches, light-headedness and seizures.  Hematological: Negative for adenopathy. Does not bruise/bleed easily.  Psychiatric/Behavioral: Negative for confusion, depression and sleep disturbance. The patient is not nervous/anxious.      PHYSICAL EXAMINATION:  Blood pressure (!) 157/82, pulse 86, temperature 98.5 F (36.9 C), temperature source Oral, resp. rate 18, height 5\' 5"  (1.651 m), weight 160 lb (72.6 kg), SpO2 97 %.  ECOG PERFORMANCE STATUS: 1  Physical Exam  Constitutional: Oriented to person, place, and time and well-developed, well-nourished, and in no distress.  HENT:  Head: Normocephalic and atraumatic.  Mouth/Throat: Oropharynx is clear and moist. No oropharyngeal exudate.  Eyes: Conjunctivae are normal. Right eye exhibits no discharge. Left eye exhibits no discharge. No scleral icterus.  Neck: Normal range of motion. Neck supple.  Cardiovascular: Normal rate, regular rhythm, normal heart sounds and intact distal pulses.   Pulmonary/Chest: Effort normal and breath sounds normal. No respiratory distress. No wheezes. No rales.  Abdominal: Soft. Bowel sounds are normal. Exhibits no distension and no mass. There is no tenderness.  Musculoskeletal: Normal range of motion. Exhibits no edema.  Lymphadenopathy:    No cervical adenopathy.  Neurological: Alert and oriented to person, place, and time. Exhibits normal muscle tone. Gait normal. Coordination normal.  Skin: Skin is  warm and dry. No rash noted. Not diaphoretic. No erythema. No pallor.  Psychiatric: Mood, memory and judgment normal.  Vitals reviewed.  LABORATORY DATA: Lab Results  Component Value Date   WBC 5.7 11/11/2021   HGB 10.9 (L) 11/11/2021   HCT 32.5 (L) 11/11/2021   MCV 90.8 11/11/2021   PLT 137 (L) 11/11/2021      Chemistry      Component Value Date/Time   NA 139 11/11/2021 1209   NA 144 03/13/2018 1427   K 3.9 11/11/2021 1209   CL 105 11/11/2021 1209   CO2 26 11/11/2021 1209   BUN 35 (H) 11/11/2021 1209   BUN 42 (H) 03/13/2018 1427   CREATININE 1.56 (H) 11/11/2021 1209   CREATININE 1.33 (H) 09/02/2015 1220      Component Value Date/Time   CALCIUM 10.4 (H) 11/11/2021 1209   ALKPHOS 68 11/11/2021 1209   AST 21 11/11/2021  1209   ALT 15 11/11/2021 1209   BILITOT 0.5 11/11/2021 1209       RADIOGRAPHIC STUDIES:  CT Chest Wo Contrast  Result Date: 11/12/2021 CLINICAL DATA:  Restaging non-small cell lung cancer. Initial diagnosis 2018. Status post right upper lobe wedge resection. EXAM: CT CHEST WITHOUT CONTRAST TECHNIQUE: Multidetector CT imaging of the chest was performed following the standard protocol without IV contrast. RADIATION DOSE REDUCTION: This exam was performed according to the departmental dose-optimization program which includes automated exposure control, adjustment of the mA and/or kV according to patient size and/or use of iterative reconstruction technique. COMPARISON:  Chest CT 11/15/2020 FINDINGS: Cardiovascular: The heart is normal in size. No pericardial effusion. Stable tortuosity and calcification of the thoracic aorta but no aneurysm. Stable three-vessel coronary artery calcifications. Mediastinum/Nodes: No mediastinal or hilar mass or lymphadenopathy. The esophagus is grossly normal. The thyroid gland is unremarkable. Lungs/Pleura: Stable surgical changes related to a right upper lobe wedge resection. No findings for recurrent tumor. No new worrisome pulmonary lesions. No findings suspicious for metastatic pulmonary disease. A few small scattered sub 4 mm pulmonary nodules are stable. No pleural effusions or pleural nodules. Upper Abdomen: No significant upper abdominal findings. Stable vascular calcifications. Musculoskeletal: No breast masses, supraclavicular or axillary adenopathy. The bony thorax is intact. Remote vertebral augmentation changes noted in the upper lumbar spine. Stable degenerative changes involving the right shoulder and stable surgical changes from left total shoulder arthroplasty. IMPRESSION: 1. Stable surgical changes related to a right upper lobe wedge resection. No findings suspicious for recurrent tumor, mediastinal/hilar adenopathy or pulmonary metastatic disease. 2. Stable  scattered sub 4 mm pulmonary nodules. 3. Stable three-vessel coronary artery calcifications. 4. Aortic atherosclerosis. Aortic Atherosclerosis (ICD10-I70.0). Electronically Signed   By: Marijo Sanes M.D.   On: 11/12/2021 09:27     ASSESSMENT/PLAN:  This is a very pleasant 71 year old Caucasian female with a history of stage Ia non-small cell lung cancer, adenocarcinoma.  She is status post a wedge resection of the right upper lobe which was performed under the care of Dr. Roxan Hockey on September 28, 2017.  The patient has been on observation since that time and feeling well.  Patient recently had a restaging CT scan performed.  Dr. Julien Nordmann personally and independently reviewed the scan and discussed results with the patient today.  The scan did not show any evidence of disease progression or recurrent disease.  Dr. Julien Nordmann recommend that the patient continue on observation with a restaging CT scan of the chest in 1 year.  She will continue to take her iron supplement for  her stable but chronic anemia.   She will follow with her eye doctor for her visual changes.   I advised her to talk to her family doctor about recommendations for management of her insomnia. She is presently taking several medications that should cause drowsiness. She reports she has been on [place] for several years.   The patient was advised to call immediately if she has any concerning symptoms in the interval. The patient voices understanding of current disease status and treatment options and is in agreement with the current care plan. All questions were answered. The patient knows to call the clinic with any problems, questions or concerns. We can certainly see the patient much sooner if necessary       Orders Placed This Encounter  Procedures   CT Chest W Contrast    Standing Status:   Future    Standing Expiration Date:   11/29/2022    Order Specific Question:   If indicated for the ordered procedure, I  authorize the administration of contrast media per Radiology protocol    Answer:   Yes    Order Specific Question:   Preferred imaging location?    Answer:   [facility]   CBC with Differential ([facility] Only)    Standing Status:   Future    Standing Expiration Date:   11/29/2022   CMP ([facility] only)    Standing Status:   Future    Standing Expiration Date:   11/29/2022     Tobe Sos Charlii Yost, PA-C 11/29/21  ADDENDUM: Hematology/Oncology Attending: I had a face-to-face encounter with the patient today.  I reviewed her record, lab, scan and recommended her care plan.  This is a very pleasant 71 years old white female with history of stage Ia non-small cell lung cancer, adenocarcinoma status post wedge resection of the right upper lobe under the care of Dr. Roxan Hockey in December 2018.  The patient has been on observation since that time and she is feeling fine with no concerning issues. She is on chronic pain medication. She had repeat CT scan of the chest performed recently.  I personally and independently reviewed the scans and discussed the results with the patient today. Her scan showed no concerning findings for disease recurrence or metastasis. I recommended for the patient to continue on observation with repeat CT scan of the chest in 1 year. She was advised to call immediately if she has any other concerning symptoms in the interval. Disclaimer: This note was dictated with voice recognition software. Similar sounding words can inadvertently be transcribed and may be missed upon review.  Eilleen Kempf, MD

## 2021-11-29 ENCOUNTER — Encounter: Payer: Self-pay | Admitting: Physician Assistant

## 2021-11-29 ENCOUNTER — Other Ambulatory Visit: Payer: Self-pay

## 2021-11-29 ENCOUNTER — Inpatient Hospital Stay: Payer: Medicare Other | Attending: Physician Assistant | Admitting: Physician Assistant

## 2021-11-29 VITALS — BP 157/82 | HR 86 | Temp 98.5°F | Resp 18 | Ht 65.0 in | Wt 160.0 lb

## 2021-11-29 DIAGNOSIS — Z85118 Personal history of other malignant neoplasm of bronchus and lung: Secondary | ICD-10-CM | POA: Insufficient documentation

## 2021-11-29 DIAGNOSIS — C3491 Malignant neoplasm of unspecified part of right bronchus or lung: Secondary | ICD-10-CM

## 2021-11-29 DIAGNOSIS — E039 Hypothyroidism, unspecified: Secondary | ICD-10-CM | POA: Diagnosis not present

## 2021-11-29 DIAGNOSIS — D649 Anemia, unspecified: Secondary | ICD-10-CM | POA: Insufficient documentation

## 2021-11-29 DIAGNOSIS — N189 Chronic kidney disease, unspecified: Secondary | ICD-10-CM | POA: Diagnosis not present

## 2022-04-05 ENCOUNTER — Ambulatory Visit: Payer: Medicare Other | Admitting: Emergency Medicine

## 2022-05-11 ENCOUNTER — Ambulatory Visit: Payer: Medicare Other | Admitting: Emergency Medicine

## 2022-08-16 ENCOUNTER — Ambulatory Visit: Payer: Medicare Other | Admitting: Emergency Medicine

## 2022-10-04 ENCOUNTER — Ambulatory Visit: Payer: Medicare Other | Admitting: Emergency Medicine

## 2022-10-06 ENCOUNTER — Ambulatory Visit: Payer: Medicare Other | Admitting: Emergency Medicine

## 2022-11-15 ENCOUNTER — Ambulatory Visit: Payer: Medicare Other | Admitting: Emergency Medicine

## 2022-11-23 ENCOUNTER — Inpatient Hospital Stay: Payer: Medicare Other

## 2022-11-23 ENCOUNTER — Ambulatory Visit (HOSPITAL_COMMUNITY): Payer: Medicare Other

## 2022-11-27 ENCOUNTER — Inpatient Hospital Stay: Payer: Medicare Other | Admitting: Internal Medicine

## 2022-12-11 ENCOUNTER — Other Ambulatory Visit: Payer: Self-pay | Admitting: Physician Assistant

## 2022-12-11 DIAGNOSIS — C3491 Malignant neoplasm of unspecified part of right bronchus or lung: Secondary | ICD-10-CM

## 2022-12-26 ENCOUNTER — Other Ambulatory Visit: Payer: Medicare Other

## 2022-12-27 ENCOUNTER — Inpatient Hospital Stay: Payer: Medicare Other | Attending: Internal Medicine

## 2022-12-27 ENCOUNTER — Other Ambulatory Visit: Payer: Self-pay | Admitting: Physician Assistant

## 2022-12-27 ENCOUNTER — Other Ambulatory Visit: Payer: Self-pay

## 2022-12-27 ENCOUNTER — Ambulatory Visit (HOSPITAL_COMMUNITY)
Admission: RE | Admit: 2022-12-27 | Discharge: 2022-12-27 | Disposition: A | Discharge status: Home / Self Care | Payer: Medicare Other | Source: Ambulatory Visit | Attending: Physician Assistant | Admitting: Physician Assistant

## 2022-12-27 DIAGNOSIS — Z08 Encounter for follow-up examination after completed treatment for malignant neoplasm: Secondary | ICD-10-CM | POA: Diagnosis present

## 2022-12-27 DIAGNOSIS — C3491 Malignant neoplasm of unspecified part of right bronchus or lung: Secondary | ICD-10-CM

## 2022-12-27 DIAGNOSIS — Z85118 Personal history of other malignant neoplasm of bronchus and lung: Secondary | ICD-10-CM | POA: Diagnosis present

## 2022-12-27 LAB — CBC WITH DIFFERENTIAL (CANCER CENTER ONLY)
Abs Immature Granulocytes: 0.02 10*3/uL (ref 0.00–0.07)
Basophils Absolute: 0.1 10*3/uL (ref 0.0–0.1)
Basophils Relative: 1 %
Eosinophils Absolute: 0.1 10*3/uL (ref 0.0–0.5)
Eosinophils Relative: 1 %
HCT: 34.4 % — ABNORMAL LOW (ref 36.0–46.0)
Hemoglobin: 11.3 g/dL — ABNORMAL LOW (ref 12.0–15.0)
Immature Granulocytes: 0 %
Lymphocytes Relative: 23 %
Lymphs Abs: 1.7 10*3/uL (ref 0.7–4.0)
MCH: 29.7 pg (ref 26.0–34.0)
MCHC: 32.8 g/dL (ref 30.0–36.0)
MCV: 90.3 fL (ref 80.0–100.0)
Monocytes Absolute: 0.4 10*3/uL (ref 0.1–1.0)
Monocytes Relative: 6 %
Neutro Abs: 5.2 10*3/uL (ref 1.7–7.7)
Neutrophils Relative %: 69 %
Platelet Count: 209 10*3/uL (ref 150–400)
RBC: 3.81 MIL/uL — ABNORMAL LOW (ref 3.87–5.11)
RDW: 13.3 % (ref 11.5–15.5)
WBC Count: 7.5 10*3/uL (ref 4.0–10.5)
nRBC: 0 % (ref 0.0–0.2)

## 2022-12-27 LAB — CMP (CANCER CENTER ONLY)
ALT: 17 U/L (ref 0–44)
AST: 24 U/L (ref 15–41)
Albumin: 4.2 g/dL (ref 3.5–5.0)
Alkaline Phosphatase: 85 U/L (ref 38–126)
Anion gap: 8 (ref 5–15)
BUN: 44 mg/dL — ABNORMAL HIGH (ref 8–23)
CO2: 27 mmol/L (ref 22–32)
Calcium: 10.4 mg/dL — ABNORMAL HIGH (ref 8.9–10.3)
Chloride: 104 mmol/L (ref 98–111)
Creatinine: 1.9 mg/dL — ABNORMAL HIGH (ref 0.44–1.00)
GFR, Estimated: 28 mL/min — ABNORMAL LOW (ref >60)
Glucose, Bld: 113 mg/dL — ABNORMAL HIGH (ref 70–99)
Potassium: 4.4 mmol/L (ref 3.5–5.1)
Sodium: 139 mmol/L (ref 135–145)
Total Bilirubin: 0.4 mg/dL (ref 0.3–1.2)
Total Protein: 7.4 g/dL (ref 6.5–8.1)

## 2023-01-02 ENCOUNTER — Inpatient Hospital Stay: Payer: Medicare Other | Admitting: Internal Medicine

## 2023-03-23 ENCOUNTER — Other Ambulatory Visit (HOSPITAL_COMMUNITY): Payer: Self-pay | Admitting: Neurological Surgery

## 2023-03-23 DIAGNOSIS — M5124 Other intervertebral disc displacement, thoracic region: Secondary | ICD-10-CM

## 2023-03-29 ENCOUNTER — Ambulatory Visit (HOSPITAL_COMMUNITY)
Admission: RE | Admit: 2023-03-29 | Discharge: 2023-03-29 | Disposition: A | Discharge status: Home / Self Care | Payer: Medicare Other | Source: Ambulatory Visit | Attending: Neurological Surgery | Admitting: Neurological Surgery

## 2023-03-29 DIAGNOSIS — M5124 Other intervertebral disc displacement, thoracic region: Secondary | ICD-10-CM | POA: Diagnosis present

## 2023-05-27 NOTE — Progress Notes (Deleted)
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  She is been on observation for her lung cancer for 3 years.  No evidence of recurrence.  She is here today to discuss progressive exertional SOB, occasionally can happen at rest. No associated sx, no wheeze, cough, pain. She wonders whether there may be an anxiety component.   Pulmonary function testing 08/24/2017 reviewed by me, shows normal spirometry, normal lung volumes and diffusion   OV 05/27/2023  Subjective:  Patient ID: [name], female , DOB: [birth year] , age 3 y.o. , MRN: [id number] , ADDRESS: [address] [place] [address] PCP Tracey Harries, MD Patient Care Team: Tracey Harries, MD as PCP - General (Family Medicine) Marcelle Overlie, MD as Consulting Physician (Obstetrics and Gynecology) Genella Mech, Marilynn Rail, PA-C as Physician Assistant (Pain Medicine)  This Provider for this visit: Treatment Team:  Attending Provider: Kalman Shan, MD    05/27/2023 -  No chief complaint on file.    HPI [name] 71 y.o. -    CT Chest data from date: ****  - personally visualized and independently interpreted : *** - my findings are: ***   PFT     Latest Ref Rng & Units 08/24/2017    1:50 PM  ILD indicators  FVC-Pre L 3.02   FVC-Predicted Pre % 91   FVC-Post L 2.97   FVC-Predicted Post % 89   TLC L 5.51   TLC Predicted % 103   DLCO uncorrected ml/min/mmHg 18.63   DLCO UNC %Pred % 69       LAB RESULTS last 96 hours No results found.  LAB RESULTS last 90 days No results found for this or any previous visit (from the past 2160 hour(s)).       has a past medical history of Anemia, Anxiety, Arthritis,  Bilateral carpal tunnel syndrome, Chest pain at rest, rule out pericarditis (11/27/2012), CHF (congestive heart failure) (HCC), Chronic kidney disease, Chronic lower back pain, Closed displaced fracture of right femoral neck (HCC) (02/18/2019), Complication of anesthesia, Depression, Dyspnea, Fibromyalgia, Headache(784.0), History of pericarditis, with pericardial window in 2009 (11/27/2012), ovarian cancer (11/27/2012), Hypothyroidism (11/27/2012), Iron deficiency anemia, Kidney carcinoma (HCC), Lung cancer (HCC) (2018), Melanoma of back (HCC), Migraines, Pneumonia, Squamous carcinoma, and Venous insufficiency (11/27/2012).   reports that she quit smoking about 37 years ago. Her smoking use included cigarettes. She started smoking about 55 years ago. She has a 27 pack-year smoking history. She has never used smokeless tobacco.  Past Surgical History:  Procedure Laterality Date   ABDOMINAL HYSTERECTOMY  1979   ANTERIOR LAT LUMBAR FUSION Left 07/16/2018   Procedure: Left Lumbar two-three  Anterolateral decompression/interbody fusion with lateral plate fixation;  Surgeon: Barnett Abu, MD;  Location: [facility] OR;  Service: Neurosurgery;  Laterality: Left;   APPENDECTOMY     COLON RESECTION  07/2019   DOPPLER ECHOCARDIOGRAPHY  02/23/2011   LVEF>50%   FRACTURE SURGERY     KNEE ARTHROSCOPY Right    "twice before the replacement" (08/19/2013)   LEFT OOPHORECTOMY Left 1979   LUMBAR DISC SURGERY  1984   "ruptured disc"   MELANOMA EXCISION  ~ 2010   "my lower back" (08/19/2013)   ORIF HIP FRACTURE Left    x 2 age 39 and 4  PARTIAL NEPHRECTOMY Right 2005; ~ 2008   PERICARDIAL WINDOW  ~ 2012   POSTERIOR LUMBAR FUSION  2002?; 03/2012   REDUCTION MAMMAPLASTY Bilateral ~ 2008   RIGHT OOPHORECTOMY  ~ 2007   "it had cancer in it" (08/19/2013)   SHOULDER ARTHROSCOPY W/ ROTATOR CUFF REPAIR Right 1990's   TONSILLECTOMY  1954   TOTAL HIP ARTHROPLASTY Right 02/18/2019   Procedure: TOTAL HIP ARTHROPLASTY;  Surgeon: Teryl Lucy, MD;  Location: [facility] OR;  Service: Orthopedics;  Laterality: Right;   TOTAL HIP REVISION Right 03/27/2019   Procedure: TOTAL HIP REVISION;  Surgeon: Durene Romans, MD;  Location: [facility];  Service: Orthopedics;  Laterality: Right;   TOTAL HIP REVISION Right 08/12/2020   Procedure: RIGHT TOTAL HIP REVISION TO CONSTRAINED LINER;  Surgeon: Durene Romans, MD;  Location: [facility];  Service: Orthopedics;  Laterality: Right;  90 mins   TOTAL KNEE ARTHROPLASTY Right 1990's   TOTAL KNEE ARTHROPLASTY Left 08/02/2015   Procedure: LEFT TOTAL KNEE ARTHROPLASTY;  Surgeon: Durene Romans, MD;  Location: [facility];  Service: Orthopedics;  Laterality: Left;   TOTAL SHOULDER REPLACEMENT Left 2006?   TUBAL LIGATION  1952   VIDEO ASSISTED THORACOSCOPY (VATS)/WEDGE RESECTION Right 09/28/2017   Procedure: RIGHT VIDEO ASSISTED THORACOSCOPY (VATS)/WEDGE RESECTION, LYMPH NODE DISSECTION ;  Surgeon: Loreli Slot, MD;  Location: [facility];  Service: Thoracic;  Laterality: Right;    Allergies  Allergen Reactions   Amoxicillin Other (See Comments)    UNSPECIFIED REACTION  Has patient had a PCN reaction causing immediate rash, facial/tongue/throat swelling, SOB or lightheadedness with hypotension: No Has patient had a PCN reaction causing severe rash involving mucus membranes or skin necrosis: No Has patient had a PCN reaction that required hospitalization No Has patient had a PCN reaction occurring within the last 10 years: No If all of the above answers are "NO", then may proceed with Cephalosporin use.   Ampicillin Rash and Other (See Comments)    Has patient had a PCN reaction causing immediate rash, facial/tongue/throat swelling, SOB or lightheadedness with hypotension: No Has patient had a PCN reaction causing severe rash involving mucus membranes or skin necrosis: No Has patient had a PCN reaction that required hospitalization No Has patient had a PCN reaction occurring within the last 10 years: No If all of the  above answers are "NO", then may proceed with Cephalosporin use.    Immunization History  Administered Date(s) Administered   Influenza Split 09/10/2012   Influenza, High Dose Seasonal PF 08/10/2017   Influenza,inj,Quad PF,6+ Mos 09/23/2013, 07/28/2014, 09/02/2015, 12/27/2016   PFIZER(Purple Top)SARS-COV-2 Vaccination 09/23/2020, 10/27/2020   Pneumococcal Conjugate-13 04/27/2015   Pneumococcal Polysaccharide-23 09/23/2013   Tetanus 12/30/2013   Zoster, Live 09/20/2012, 10/23/2014    Family History  Problem Relation Age of Onset   Cancer Mother 70       Stomach   Cancer Father 26       Lung cancer   Bipolar disorder Sister      Current Outpatient Medications:    acetaminophen (TYLENOL) 500 MG tablet, Take 2 tablets (1,000 mg total) by mouth every 8 (eight) hours., Disp: 30 tablet, Rfl: 0   buPROPion (WELLBUTRIN SR) 150 MG 12 hr tablet, TAKE 1 TABLET BY MOUTH DAILY (Patient taking differently: Take 150 mg by mouth 2 (two) times daily.), Disp: 30 tablet, Rfl: 0   clonazePAM (KLONOPIN) 1 MG tablet, Take 1 mg by mouth 3 (three) times daily as needed for anxiety., Disp: , Rfl:    D-Mannose  500 MG CAPS, Take 500 mg by mouth daily., Disp: , Rfl:    diclofenac Sodium (VOLTAREN) 1 % GEL, Apply topically., Disp: , Rfl:    ergocalciferol (VITAMIN D2) 1.25 MG (50000 UT) capsule, Take 50,000 Units by mouth once a week., Disp: , Rfl:    escitalopram (LEXAPRO) 10 MG tablet, Take 10 mg by mouth daily., Disp: , Rfl:    ferrous sulfate (FERROUSUL) 325 (65 FE) MG tablet, Take 1 tablet (325 mg total) by mouth 3 (three) times daily with meals. (Patient taking differently: Take 325 mg by mouth daily with breakfast.), Disp: , Rfl: 3   imiquimod (ALDARA) 5 % cream, Apply 1 application topically at bedtime., Disp: , Rfl:    levothyroxine (SYNTHROID) 75 MCG tablet, Take 75 mcg by mouth daily before breakfast., Disp: , Rfl:    methocarbamol (ROBAXIN) 500 MG tablet, Take 1 tablet (500 mg total) by mouth every  6 (six) hours as needed for muscle spasms., Disp: 40 tablet, Rfl: 0   morphine (MS CONTIN) 30 MG 12 hr tablet, Take 1 tablet (30 mg total) by mouth every 12 (twelve) hours., Disp: 60 tablet, Rfl: 0   morphine (MSIR) 15 MG tablet, Take by mouth., Disp: , Rfl:    Multiple Vitamin (MULTIVITAMIN WITH MINERALS) TABS tablet, Take 1 tablet by mouth daily., Disp: , Rfl:    pantoprazole (PROTONIX) 40 MG tablet, Take 40 mg by mouth daily., Disp: , Rfl:    psyllium (KONSYL) 33 % POWD, psyllium husk (with sugar) 3 gram/12 gram oral powder   1 {packet} by oral route., Disp: , Rfl:    psyllium (METAMUCIL) 58.6 % packet, Take 1 packet by mouth 2 (two) times daily. , Disp: , Rfl:    triamterene-hydrochlorothiazide (MAXZIDE) 75-50 MG tablet, Take 1 tablet by mouth daily., Disp: , Rfl:    zolpidem (AMBIEN) 5 MG tablet, Take 5 mg by mouth at bedtime. , Disp: , Rfl:    zolpidem (AMBIEN) 5 MG tablet, Take 1 tablet by mouth at bedtime as needed., Disp: , Rfl:       Objective:   There were no vitals filed for this visit.  Estimated body mass index is 26.63 kg/m as calculated from the following:   Height as of 11/29/21: 5\' 5"  (1.651 m).   Weight as of 11/29/21: 72.6 kg.  @WEIGHTCHANGE @  There were no vitals filed for this visit.   Physical Exam   General: No distress. *** O2 at rest: *** Cane present: *** Sitting in wheel chair: *** Frail: *** Obese: *** Neuro: Alert and Oriented x 3. GCS 15. Speech normal Psych: Pleasant Resp:  Barrel Chest - ***.  Wheeze - ***, Crackles - ***, No overt respiratory distress CVS: Normal heart sounds. Murmurs - *** Ext: Stigmata of Connective Tissue Disease - *** HEENT: Normal upper airway. PEERL +. No post nasal drip        Assessment:     No diagnosis found.     Plan:     There are no Patient Instructions on file for this visit.   FOLLOWUP No follow-ups on file.    SIGNATURE    Dr. Kalman Shan, M.D., F.C.C.P,  Pulmonary and Critical Care  Medicine Staff Physician, [facility] Center Director - Interstitial Lung Disease  Program  Pulmonary Fibrosis [facility] at [facility] [place], [place], [address]  Pager: [phone], If no answer or between  15:00h - 7:00h: call [phone] Telephone: [phone]  11:29 AM 05/27/2023   Moderate Complexity MDM OFFICE  2021 E/M guidelines, first released in 2021, with minor revisions added in 2023 and 2024 Must meet the requirements for 2 out of 3 dimensions to qualify.    Number and complexity of problems addressed Amount and/or complexity of data reviewed Risk of complications and/or morbidity  One or more chronic illness with mild exacerbation, OR progression, OR  side effects of treatment  Two or more stable chronic illnesses  One undiagnosed new problem with uncertain prognosis  One acute illness with systemic symptoms   One Acute complicated injury Must meet the requirements for 1 of 3 of the categories)  Category 1: Tests and documents, historian  Any combination of 3 of the following:  Assessment requiring an independent historian  Review of prior external note(s) from each unique source  Review of results of each unique test  Ordering of each unique test    Category 2: Interpretation of tests   Independent interpretation of a test performed by another physician/other qualified health care professional (not separately reported)  Category 3: Discuss management/tests  Discussion of management or test interpretation with external physician/other qualified health care professional/appropriate source (not separately reported) Moderate risk of morbidity from additional diagnostic testing or treatment Examples only:  Prescription drug management  Decision regarding minor surgery with identfied patient or procedure risk factors  Decision regarding elective major surgery without identified patient or procedure risk  factors  Diagnosis or treatment significantly limited by social determinants of health             HIGh Complexity  OFFICE   2021 E/M guidelines, first released in 2021, with minor revisions added in 2023. Must meet the requirements for 2 out of 3 dimensions to qualify.    Number and complexity of problems addressed Amount and/or complexity of data reviewed Risk of complications and/or morbidity  Severe exacerbation of chronic illness  Acute or chronic illnesses that may pose a threat to life or bodily function, e.g., multiple trauma, acute MI, pulmonary embolus, severe respiratory distress, progressive rheumatoid arthritis, psychiatric illness with potential threat to self or others, peritonitis, acute renal failure, abrupt change in neurological status Must meet the requirements for 2 of 3 of the categories)  Category 1: Tests and documents, historian  Any combination of 3 of the following:  Assessment requiring an independent historian  Review of prior external note(s) from each unique source  Review of results of each unique test  Ordering of each unique test    Category 2: Interpretation of tests    Independent interpretation of a test performed by another physician/other qualified health care professional (not separately reported)  Category 3: Discuss management/tests  Discussion of management or test interpretation with external physician/other qualified health care professional/appropriate source (not separately reported)  HIGH risk of morbidity from additional diagnostic testing or treatment Examples only:  Drug therapy requiring intensive monitoring for toxicity  Decision for elective major surgery with identified pateint or procedure risk factors  Decision regarding hospitalization or escalation of level of care  Decision for DNR or to de-escalate care   Parenteral controlled  substances            LEGEND - Independent interpretation involves  the interpretation of a test for which there is a CPT code, and an interpretation or report is customary. When a review and interpretation of a test is performed and documented by the provider, but not separately reported (billed), then this would  represent an independent interpretation. This report does not need to conform to the usual standards of a complete report of the test. This does not include interpretation of tests that do not have formal reports such as a complete blood count with differential and blood cultures. Examples would include reviewing a chest radiograph and documenting in the medical record an interpretation, but not separately reporting (billing) the interpretation of the chest radiograph.   An appropriate source includes professionals who are not health care professionals but may be involved in the management of the patient, such as a Clinical research associate, upper officer, case manager or teacher, and does not include discussion with family or informal caregivers.    - SDOH: SDOH are the conditions in the environments where people are born, live, learn, work, play, worship, and age that affect a wide range of health, functioning, and quality-of-life outcomes and risks. (e.g., housing, food insecurity, transportation, etc.). SDOH-related Z codes ranging from Z55-Z65 are the ICD-10-CM diagnosis codes used to document SDOH data Z55 - Problems related to education and literacy Z56 - Problems related to employment and unemployment Z57 - Occupational exposure to risk factors Z58 - Problems related to physical environment Z59 - Problems related to housing and economic circumstances [phone] - Problems related to social environment [phone] - Problems related to upbringing [phone] - Other problems related to primary support group, including family circumstances Z25 - Problems related to certain psychosocial circumstances Z65 - Problems related to other psychosocial circumstances

## 2023-05-28 ENCOUNTER — Ambulatory Visit: Payer: Medicare Other | Admitting: Internal Medicine

## 2023-06-02 ENCOUNTER — Emergency Department (HOSPITAL_COMMUNITY)
Admission: EM | Admit: 2023-06-02 | Discharge: 2023-06-02 | Discharge status: Against Medical Advice | Payer: Medicare Other | Source: Home / Self Care

## 2023-06-04 ENCOUNTER — Ambulatory Visit: Payer: Medicare Other | Admitting: Internal Medicine

## 2023-06-06 ENCOUNTER — Other Ambulatory Visit: Payer: Self-pay | Admitting: Nurse Practitioner

## 2023-06-06 DIAGNOSIS — M961 Postlaminectomy syndrome, not elsewhere classified: Secondary | ICD-10-CM

## 2023-07-04 ENCOUNTER — Ambulatory Visit: Payer: Medicare Other | Admitting: Neurology

## 2023-07-05 ENCOUNTER — Ambulatory Visit: Payer: Medicare Other | Admitting: Adult Health

## 2023-07-09 ENCOUNTER — Encounter: Payer: Self-pay | Admitting: Adult Health

## 2023-07-12 ENCOUNTER — Ambulatory Visit
Admission: RE | Admit: 2023-07-12 | Discharge: 2023-07-12 | Disposition: A | Discharge status: Home / Self Care | Payer: Medicare HMO | Source: Ambulatory Visit | Attending: Nurse Practitioner | Admitting: Nurse Practitioner

## 2023-07-12 DIAGNOSIS — M961 Postlaminectomy syndrome, not elsewhere classified: Secondary | ICD-10-CM

## 2023-07-25 ENCOUNTER — Encounter: Payer: Self-pay | Admitting: Neurology

## 2023-07-25 ENCOUNTER — Ambulatory Visit: Payer: Medicare HMO | Admitting: Neurology

## 2023-07-25 VITALS — BP 127/81 | HR 86 | Ht 65.0 in | Wt 165.0 lb

## 2023-07-25 DIAGNOSIS — G629 Polyneuropathy, unspecified: Secondary | ICD-10-CM | POA: Diagnosis not present

## 2023-07-25 DIAGNOSIS — R413 Other amnesia: Secondary | ICD-10-CM

## 2023-07-25 DIAGNOSIS — R269 Unspecified abnormalities of gait and mobility: Secondary | ICD-10-CM

## 2023-07-25 DIAGNOSIS — R270 Ataxia, unspecified: Secondary | ICD-10-CM

## 2023-07-25 NOTE — Progress Notes (Signed)
[facility]  PATIENT: [name] DOB: [birth year]  REFERRING DOCTOR OR PCP: Tracey Harries, MD SOURCE: Patient, notes from primary care imaging and lab results, MRI images personally reviewed.  CT scan images reviewed  _________________________________   HISTORICAL  CHIEF COMPLAINT:  Chief Complaint  Patient presents with   Room 10    Pt is here with her Daughter. Pt's daughter states that pt memory changes within the last year. Pt's daughter states that pt's short term memory is declining. Pt's daughter states that pt's balance is off. Pt's daughter states that pt falls a lot. Pt's daughter states that pt has confusion.      HISTORY OF PRESENT ILLNESS:  I had the pleasure to see your patient, [name], at [facility] for neurologic consultation regarding her memory decline and gait difficulties.  She is a 72 year old woman who has had progressive cognitive changes over the last couple years.   Her daughter notes that [name] has more memory issues and confusion than she does.   [name] feels the memory issues are just minimal and due to age   Her daughter notes she has missed several appointments due to not remembering.  She has poor time management.   She has trouble following recipes and doing financial management,     I reviewed a CT scan from 04/09/2021 and it showed mild atrophy.      She reports she has had progressive worsening balance and it seemed to worsen after a hip operation two years ago.   She has had lumbar multiple spine operations and multiple hip operations.   She is unstable an has had a few falls.  When a fall occurs legs give out or she falls forward.   Not falling backwards.     She gets jittery and shaky at times.     Her left leg seems weaker than the right to her.    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   She has been told she had mild CHF in past.   No arrhythmias.     Due to  chroni pain she is on morphine sulfate 30 mg bid.  Also on clonazepam, Wellbutrin, Lexapro      07/25/2023    2:21 PM  Montreal Cognitive Assessment   Visuospatial/ Executive (0/5) 4  Naming (0/3) 3  Attention: Read list of digits (0/2) 2  Attention: Read list of letters (0/1) 1  Attention: Serial 7 subtraction starting at 100 (0/3) 3  Language: Repeat phrase (0/2) 2  Language : Fluency (0/1) 1  Abstraction (0/2) 2  Delayed Recall (0/5) 0  Orientation (0/6) 6  Total 24  Adjusted Score (based on education) 25    15 minutes later got 2/5 spontaneously and 3 with category prompt   IMAGING MRI of the lumbar spine 07/12/2023 shows fusion from L3-S1.  The L3-L4 interspace is hard to evaluate though there could be spinal stenosis.  There does appear to be moderate spinal stenosis at L2-L3 above the fusion.  MRI of the thoracic spine 04/08/2023 shows multilevel disc protrusions.  There is minimal spinal stenosis and various degrees of foraminal narrowing at T10-L1 due to disc and facet changes..    CT scan of the head 04/11/2021 showed mild atrophy.  Minimal chronic microvascular ischemic changes.  No acute findings.  REVIEW OF SYSTEMS: Constitutional: No fevers, chills, sweats, or change in appetite Eyes: No visual changes, double vision,  eye pain Ear, nose and throat: No hearing loss, ear pain, nasal congestion, sore throat Cardiovascular: No chest pain, palpitations Respiratory:  No shortness of breath at rest or with exertion.   No wheezes GastrointestinaI: No nausea, vomiting, diarrhea, abdominal pain, fecal incontinence Genitourinary:  No dysuria, urinary retention or frequency.  No nocturia. Musculoskeletal: Chronic spine and joint pain.  Multiple joint operations.   Integumentary: No rash, pruritus, skin lesions Neurological: as above Psychiatric: Has depression and anxiety.  Endocrine: No palpitations, diaphoresis, change in appetite, change in weigh or increased  thirst Hematologic/Lymphatic:  No anemia, purpura, petechiae. Allergic/Immunologic: No itchy/runny eyes, nasal congestion, recent allergic reactions, rashes  ALLERGIES: Allergies  Allergen Reactions   Amoxicillin Other (See Comments)    UNSPECIFIED REACTION  Has patient had a PCN reaction causing immediate rash, facial/tongue/throat swelling, SOB or lightheadedness with hypotension: No Has patient had a PCN reaction causing severe rash involving mucus membranes or skin necrosis: No Has patient had a PCN reaction that required hospitalization No Has patient had a PCN reaction occurring within the last 10 years: No If all of the above answers are "NO", then may proceed with Cephalosporin use.   Ampicillin Rash and Other (See Comments)    Has patient had a PCN reaction causing immediate rash, facial/tongue/throat swelling, SOB or lightheadedness with hypotension: No Has patient had a PCN reaction causing severe rash involving mucus membranes or skin necrosis: No Has patient had a PCN reaction that required hospitalization No Has patient had a PCN reaction occurring within the last 10 years: No If all of the above answers are "NO", then may proceed with Cephalosporin use.    HOME MEDICATIONS:   PAST MEDICAL HISTORY: Past Medical History:  Diagnosis Date   Anemia    Anxiety    Arthritis    "everywhere" (08/19/2013)   Bilateral carpal tunnel syndrome    Chest pain at rest, rule out pericarditis 11/27/2012   CHF (congestive heart failure) (HCC)    Chronic kidney disease    dr Isabel Caprice   Chronic lower back pain    Closed displaced fracture of right femoral neck (HCC) 02/18/2019   Complication of anesthesia    "woke up twice during knee replacement" (08/19/2013)   Depression    Dyspnea    w/ exertion     Fibromyalgia    Headache(784.0)    hx of migraines    History of pericarditis, with pericardial window in 2009 11/27/2012   Hx of ovarian cancer 11/27/2012   Hypothyroidism 11/27/2012    Iron deficiency anemia    Kidney carcinoma (HCC)    "both kidneys; ~ 3 yr apart" (08/19/2013)   Lung cancer (HCC) 2018   Melanoma of back (HCC)    Migraines    Pneumonia    "several times; including today", RUL/notes 08/19/2013   Squamous carcinoma    "face, side of my nose, chest; used acid to get rid of them" (08/19/2013)   Venous insufficiency 11/27/2012    PAST SURGICAL HISTORY: Past Surgical History:  Procedure Laterality Date   ABDOMINAL HYSTERECTOMY  1979   ANTERIOR LAT LUMBAR FUSION Left 07/16/2018   Procedure: Left Lumbar two-three  Anterolateral decompression/interbody fusion with lateral plate fixation;  Surgeon: Barnett Abu, MD;  Location: [facility] OR;  Service: Neurosurgery;  Laterality: Left;   APPENDECTOMY     COLON RESECTION  07/2019   DOPPLER ECHOCARDIOGRAPHY  02/23/2011   LVEF>50%   FRACTURE SURGERY     KNEE ARTHROSCOPY Right    "twice before  the replacement" (08/19/2013)   LEFT OOPHORECTOMY Left 1979   LUMBAR DISC SURGERY  1984   "ruptured disc"   MELANOMA EXCISION  ~ 2010   "my lower back" (08/19/2013)   ORIF HIP FRACTURE Left    x 2 age 15 and 4   PARTIAL NEPHRECTOMY Right 2005; ~ 2008   PERICARDIAL WINDOW  ~ 2012   POSTERIOR LUMBAR FUSION  2002?; 03/2012   REDUCTION MAMMAPLASTY Bilateral ~ 2008   RIGHT OOPHORECTOMY  ~ 2007   "it had cancer in it" (08/19/2013)   SHOULDER ARTHROSCOPY W/ ROTATOR CUFF REPAIR Right 1990's   TONSILLECTOMY  1954   TOTAL HIP ARTHROPLASTY Right 02/18/2019   Procedure: TOTAL HIP ARTHROPLASTY;  Surgeon: Teryl Lucy, MD;  Location: [facility] OR;  Service: Orthopedics;  Laterality: Right;   TOTAL HIP REVISION Right 03/27/2019   Procedure: TOTAL HIP REVISION;  Surgeon: Durene Romans, MD;  Location: [facility];  Service: Orthopedics;  Laterality: Right;   TOTAL HIP REVISION Right 08/12/2020   Procedure: RIGHT TOTAL HIP REVISION TO CONSTRAINED LINER;  Surgeon: Durene Romans, MD;  Location: [facility];  Service: Orthopedics;  Laterality: Right;  90 mins    TOTAL KNEE ARTHROPLASTY Right 1990's   TOTAL KNEE ARTHROPLASTY Left 08/02/2015   Procedure: LEFT TOTAL KNEE ARTHROPLASTY;  Surgeon: Durene Romans, MD;  Location: [facility];  Service: Orthopedics;  Laterality: Left;   TOTAL SHOULDER REPLACEMENT Left 2006?   TUBAL LIGATION  1952   VIDEO ASSISTED THORACOSCOPY (VATS)/WEDGE RESECTION Right 09/28/2017   Procedure: RIGHT VIDEO ASSISTED THORACOSCOPY (VATS)/WEDGE RESECTION, LYMPH NODE DISSECTION ;  Surgeon: Loreli Slot, MD;  Location: [facility] OR;  Service: Thoracic;  Laterality: Right;    FAMILY HISTORY: Family History  Problem Relation Age of Onset   Cancer Mother 92       Stomach   Cancer Father 12       Lung cancer   Bipolar disorder Sister     SOCIAL HISTORY: Social History   Socioeconomic History   Marital status: Married    Spouse name: Not on file   Number of children: 2   Years of education: Not on file   Highest education level: Not on file  Occupational History   Not on file  Tobacco Use   Smoking status: Former    Current packs/day: 0.00    Average packs/day: 1.5 packs/day for 18.0 years (27.0 ttl pk-yrs)    Types: Cigarettes    Start date: 09/23/1967    Quit date: 09/22/1985    Years since quitting: 37.8   Smokeless tobacco: Never  Vaping Use   Vaping status: Never Used  Substance and Sexual Activity   Alcohol use: No   Drug use: No    Comment: prescribed   Sexual activity: Not Currently  Other Topics Concern   Not on file  Social History Narrative   Married.  Lives with husband.  Two children one boy and one girl.  Three granddaughters and two great granddaughters.    Social Determinants of Health   Financial Resource Strain: Low Risk  (08/14/2022)   Received from [facility], [facility]   Overall Financial Resource Strain (CARDIA)    Difficulty of Paying Living Expenses: Not hard at all  Food Insecurity: No Food Insecurity (08/14/2022)   Received from [facility], [facility]   Hunger Vital  Sign    Worried About Running Out of Food in the Last Year: Never true    Ran Out of Food in the Last  Year: Never true  Transportation Needs: No Transportation Needs (08/14/2022)   Received from [facility], [facility]   [facility] - Transportation    Lack of Transportation (Medical): No    Lack of Transportation (Non-Medical): No  Physical Activity: Insufficiently Active (08/14/2022)   Received from [facility], [facility]   Exercise Vital Sign    Days of Exercise per Week: 7 days    Minutes of Exercise per Session: 10 min  Stress: Stress Concern Present (08/14/2022)   Received from [facility], [facility] - Occupational Stress Questionnaire    Feeling of Stress : To some extent  Social Connections: Unknown (06/22/2023)   Received from [facility]   Social Network    Social Network: Not on file  Intimate Partner Violence: Unknown (06/22/2023)   Received from [facility]   HITS    Physically Hurt: Not on file    Insult or Talk Down To: Not on file    Threaten Physical Harm: Not on file    Scream or Curse: Not on file       PHYSICAL EXAM  Vitals:   07/25/23 1419  BP: 127/81  Pulse: 86  Weight: 165 lb (74.8 kg)  Height: 5\' 5"  (1.651 m)    Body mass index is 27.46 kg/m.   General: The patient is well-developed and well-nourished and in no acute distress  HEENT:  Head is [place]/AT.  Sclera are anicteric.   Neck: No carotid bruits are noted.  The neck is nontender.  Cardiovascular: The heart has a regular rate and rhythm with a normal S1 and S2. There were no murmurs, gallops or rubs.    Skin: Extremities are without rash or  edema.  Musculoskeletal:  Back is nontender  Neurologic Exam  Mental status: The patient is alert and oriented x 3 at the time of the examination.  She had reduced short-term memory.  Executive function was mildly reduced.  Speech was normal.  .  Cranial nerves: Extraocular movements  are full. Pupils are equal, round, and reactive to light and accomodation.  Visual fields are full.  Facial symmetry is present. There is good facial sensation to soft touch bilaterally.Facial strength is normal.  Trapezius and sternocleidomastoid strength is normal. No dysarthria is noted.  The tongue is midline, and the patient has symmetric elevation of the soft palate. No obvious hearing deficits are noted.  Motor:  Muscle bulk is normal.   Tone is normal. Strength is  5 / 5 in all 4 extremities.   Sensory: Sensory testing is intact to pinprick, soft touch and vibration sensation in the arms.  She has reduced vibration sensation at the toes (25%) and ankles (50%) but normal sensation at the knees.  Pinprick sensation was fine.  Coordination: Cerebellar testing reveals good finger-nose-finger and heel-to-shin bilaterally.  Gait and station: She uses her arms to get up out of the chair.  The gait is mildly shuffling.  She takes 5 steps to turn 180 degrees.  She has retropulsion.  Romberg is negative.   Reflexes: Deep tendon reflexes are symmetric and normal bilaterally.   Plantar responses are flexor.    DIAGNOSTIC DATA (LABS, IMAGING, TESTING) - I reviewed patient records, labs, notes, testing and imaging myself where available.  Lab Results  Component Value Date   WBC 7.5 12/27/2022   HGB 11.3 (L) 12/27/2022   HCT 34.4 (L) 12/27/2022   MCV 90.3 12/27/2022   PLT 209 12/27/2022  Component Value Date/Time   NA 139 12/27/2022 1429   NA 144 03/13/2018 1427   K 4.4 12/27/2022 1429   CL 104 12/27/2022 1429   CO2 27 12/27/2022 1429   GLUCOSE 113 (H) 12/27/2022 1429   BUN 44 (H) 12/27/2022 1429   BUN 42 (H) 03/13/2018 1427   CREATININE 1.90 (H) 12/27/2022 1429   CREATININE 1.33 (H) 09/02/2015 1220   CALCIUM 10.4 (H) 12/27/2022 1429   PROT 7.4 12/27/2022 1429   PROT 7.1 03/13/2018 1427   ALBUMIN 4.2 12/27/2022 1429   ALBUMIN 4.4 03/13/2018 1427   AST 24 12/27/2022 1429   ALT  17 12/27/2022 1429   ALKPHOS 85 12/27/2022 1429   BILITOT 0.4 12/27/2022 1429   GFRNONAA 28 (L) 12/27/2022 1429   GFRNONAA 42 (L) 09/02/2015 1220   GFRAA 49 (L) 03/09/2020 2027   GFRAA 55 (L) 11/13/2019 1024   GFRAA 48 (L) 09/02/2015 1220   Lab Results  Component Value Date   CHOL 205 (H) 11/15/2017   HDL 71 11/15/2017   LDLCALC 117 (H) 11/15/2017   TRIG 85 11/15/2017   CHOLHDL 2.9 11/15/2017   Lab Results  Component Value Date   HGBA1C 5.5 12/04/2018   Lab Results  Component Value Date   VITAMINB12 1,192 (H) 12/25/2018   Lab Results  Component Value Date   TSH 1.63 02/23/2021       ASSESSMENT AND PLAN  Memory loss - Plan: ATN PROFILE, Vitamin B12  Gait disturbance - Plan: MR CERVICAL SPINE WO CONTRAST, CANCELED: MR CERVICAL SPINE WO CONTRAST  Polyneuropathy - Plan: Vitamin B12, Copper, serum, Multiple Myeloma Panel (SPEP&IFE w/QIG)  Ataxia - Plan: MR BRAIN WO CONTRAST, MR CERVICAL SPINE WO CONTRAST, CANCELED: MR BRAIN WO CONTRAST, CANCELED: MR CERVICAL SPINE WO CONTRAST   In summary, Ms. Bloodsaw is a 72 year old woman with memory loss and gait disturbance.  She scored 25/30 on the [facility] cognitive assessment which is slightly abnormal consistent with mild cognitive impairment.  She did remember 2 of the 5 words 15 to 20 minutes later.  Mild cognitive impairment can be caused by many different processes and is sometimes reversible.  We will check lab work for B12 and the amyloid 40/42 ratio and the pTau181 (biomarkers for AD).  Additionally because of the polyneuropathy we will check copper and SPEP/IFE.  She has retropulsion.  I discussed with her and her daughter that Lewy body disease could explain the combination of gait and cognitive change or stop her processes could be occurring.  I will also check an MRI of the brain and cervical spine to rule out normal pressure hydrocephalus and cervical myelopathy.    Based on the results, additional studies may be necessary.   They will return to see me in 3 to 4 months or sooner if there are new or worsening neurologic symptoms.    Thank you for asking me to see Ms. Pedregon.  Please let me know if I can be of further assistance with her or other patients in the future.   Juron Vorhees A. Epimenio Foot, MD, [facility] 07/25/2023, 2:52 PM Certified in Neurology, Clinical Neurophysiology, Sleep Medicine and Neuroimaging  [facility] [address], [address] [place], [place] [address] [phone]

## 2023-07-31 LAB — MULTIPLE MYELOMA PANEL, SERUM
Albumin SerPl Elph-Mcnc: 3.7 g/dL (ref 2.9–4.4)
Albumin/Glob SerPl: 1.4 (ref 0.7–1.7)
Alpha 1: 0.3 g/dL (ref 0.0–0.4)
Alpha2 Glob SerPl Elph-Mcnc: 0.8 g/dL (ref 0.4–1.0)
B-Globulin SerPl Elph-Mcnc: 1 g/dL (ref 0.7–1.3)
Gamma Glob SerPl Elph-Mcnc: 0.6 g/dL (ref 0.4–1.8)
Globulin, Total: 2.7 g/dL (ref 2.2–3.9)
IgA/Immunoglobulin A, Serum: 285 mg/dL (ref 64–422)
IgG (Immunoglobin G), Serum: 614 mg/dL (ref 586–1602)
IgM (Immunoglobulin M), Srm: 69 mg/dL (ref 26–217)
Total Protein: 6.4 g/dL (ref 6.0–8.5)

## 2023-07-31 LAB — ATN PROFILE
A -- Beta-amyloid 42/40 Ratio: 0.128 (ref >0.102)
Beta-amyloid 40: 300.92 pg/mL
Beta-amyloid 42: 38.44 pg/mL
N -- NfL, Plasma: 9.77 pg/mL — ABNORMAL HIGH (ref 0.00–7.64)
T -- p-tau181: 3.72 pg/mL — ABNORMAL HIGH (ref 0.00–0.97)

## 2023-07-31 LAB — VITAMIN B12: Vitamin B-12: 520 pg/mL (ref 232–1245)

## 2023-07-31 LAB — COPPER, SERUM: Copper: 126 ug/dL (ref 80–158)

## 2023-08-01 ENCOUNTER — Telehealth: Payer: Self-pay | Admitting: Neurology

## 2023-08-01 NOTE — Telephone Encounter (Signed)
\  Cohere Berkley Harvey: [phone] exp. 08/01/23-09/30/23 sent to GI [phone]

## 2023-08-20 ENCOUNTER — Ambulatory Visit: Payer: Medicare HMO

## 2023-08-20 ENCOUNTER — Ambulatory Visit: Payer: Medicare HMO | Admitting: Nurse Practitioner

## 2023-08-20 ENCOUNTER — Encounter: Payer: Self-pay | Admitting: Nurse Practitioner

## 2023-08-20 VITALS — BP 166/80 | HR 87 | Ht 65.5 in | Wt 166.6 lb

## 2023-08-20 DIAGNOSIS — R4189 Other symptoms and signs involving cognitive functions and awareness: Secondary | ICD-10-CM

## 2023-08-20 DIAGNOSIS — R0609 Other forms of dyspnea: Secondary | ICD-10-CM

## 2023-08-20 DIAGNOSIS — N184 Chronic kidney disease, stage 4 (severe): Secondary | ICD-10-CM | POA: Diagnosis not present

## 2023-08-20 DIAGNOSIS — C3491 Malignant neoplasm of unspecified part of right bronchus or lung: Secondary | ICD-10-CM | POA: Diagnosis not present

## 2023-08-20 DIAGNOSIS — D631 Anemia in chronic kidney disease: Secondary | ICD-10-CM

## 2023-08-20 LAB — CBC WITH DIFFERENTIAL/PLATELET
Basophils Absolute: 0.1 10*3/uL (ref 0.0–0.1)
Basophils Relative: 0.9 % (ref 0.0–3.0)
Eosinophils Absolute: 0.1 10*3/uL (ref 0.0–0.7)
Eosinophils Relative: 1.9 % (ref 0.0–5.0)
HCT: 28.1 % — ABNORMAL LOW (ref 36.0–46.0)
Hemoglobin: 9 g/dL — ABNORMAL LOW (ref 12.0–15.0)
Lymphocytes Relative: 32 % (ref 12.0–46.0)
Lymphs Abs: 1.9 10*3/uL (ref 0.7–4.0)
MCHC: 31.9 g/dL (ref 30.0–36.0)
MCV: 88.2 fL (ref 78.0–100.0)
Monocytes Absolute: 0.5 10*3/uL (ref 0.1–1.0)
Monocytes Relative: 8.8 % (ref 3.0–12.0)
Neutro Abs: 3.4 10*3/uL (ref 1.4–7.7)
Neutrophils Relative %: 56.4 % (ref 43.0–77.0)
Platelets: 180 10*3/uL (ref 150.0–400.0)
RBC: 3.18 Mil/uL — ABNORMAL LOW (ref 3.87–5.11)
RDW: 14.7 % (ref 11.5–15.5)
WBC: 6.1 10*3/uL (ref 4.0–10.5)

## 2023-08-20 LAB — TSH: TSH: 1.67 u[IU]/mL (ref 0.35–5.50)

## 2023-08-20 LAB — BRAIN NATRIURETIC PEPTIDE: Pro B Natriuretic peptide (BNP): 270 pg/mL — ABNORMAL HIGH (ref 0.0–100.0)

## 2023-08-20 MED ORDER — ALBUTEROL SULFATE HFA 108 (90 BASE) MCG/ACT IN AERS
2.0000 | INHALATION_SPRAY | Freq: Four times a day (QID) | RESPIRATORY_TRACT | 2 refills | Status: AC | PRN
Start: 2023-08-20 — End: ?

## 2023-08-20 NOTE — Assessment & Plan Note (Signed)
Cognitive decline and progressive weakness over the last year.  Has MRI of the brain scheduled next week.  Follow-up with neurology as scheduled.

## 2023-08-20 NOTE — Assessment & Plan Note (Signed)
Stable postoperative changes on CT from March 2024.  She did have a new area of groundglass in the right anterior lung.  She was supposed to have repeat CT in September 2024.  She has been lost to follow-up with oncology since her visit in February 2023.  Will coordinate with oncology team to get her rescheduled for CT and follow-up with them.

## 2023-08-20 NOTE — Patient Instructions (Addendum)
Trial Albuterol inhaler 2 puffs every 6 hours as needed for shortness of breath or wheezing. Notify if symptoms persist despite rescue inhaler/neb use. Let me know if you notice a huge difference with this and we can try a longer acting inhaler   Echocardiogram ordered - someone should contact you for scheduling  Labs and chest x ray today   I am going to reach out to your cancer doctor as you haven't seen them in over a year and you were supposed to have a repeat CT scan in September   Your oxygen levels did not drop at all with your walk test today, which is good news.  Follow up in 6-8 weeks after full PFT with Dr. Delton Coombes (1st) or Katie Tanayia Wahlquist,NP. If symptoms do not improve or worsen, please contact office for sooner follow up or seek emergency care.

## 2023-08-20 NOTE — Progress Notes (Signed)
@Patient  ID: [name], female    DOB: 08-31-1951, 72 y.o.   MRN: [id number]  Chief Complaint  Patient presents with   Acute Visit    Pt c/o SOB for at least 6 months.    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  Past medical history significant for chronic venous insufficiency, hypertension, hypothyroid, anxiety, chronic pain syndrome on chronic narcotics.  TEST/EVENTS:  08/24/2017 PFT: FVC 91, FEV1 103, ratio 89, TLC 103, DLCOunc 69 12/27/2022 CT chest without contrast: Scattered vascular calcifications.  Small thyroid gland.  No LAD.  Left lung clear.  Right lung with postsurgical changes and right upper lobe scarring.  Minimal areas of groundglass with right upper lobe.  Minimal groundglass in the anterior right lung, new since previous imaging but poorly defined.  Stable 3 mm right lower lobe nodule.  Fatty atrophy of the pancreas.  Mild degenerative changes along the spine.  02/23/2021: Ov with Dr. Delton Coombes. Here to discuss progressive exertional SOB, occasionally happens at rest. No associated sx, no wheeze, cough, pain. She wonders whether there may be an anxiety component. Surveillance CTs. Etiology of DOE unclear. She needs repeat PFT. Differential dxx broad - ordered echo, TSH, CBC.   08/20/2023: Today - overdue follow up Patient presents today for overdue follow up. She started noticing about 6 months ago worsening shortness of breath. Always with activity. She can't walk the length of a football field without having to stop. She can do ADLs as long as she takes her time. No cough, wheezing, chest congestion, swelling in legs, orthopnea. She doesn't have a rescue inhaler at home. She's never been diagnosed with asthma. She does have a smoking history. She never had follow up with oncology after her CT March 2024. She was supposed to have 6 month follow up  CT. No hemoptysis, anorexia, weight loss, night sweats. She has been having some changes in her mobility over the last year. She has fallen multiple times. She has a MRI of her brain scheduled next week and follow up with neurology.   Allergies  Allergen Reactions   Amoxicillin Other (See Comments)    UNSPECIFIED REACTION  Has patient had a PCN reaction causing immediate rash, facial/tongue/throat swelling, SOB or lightheadedness with hypotension: No Has patient had a PCN reaction causing severe rash involving mucus membranes or skin necrosis: No Has patient had a PCN reaction that required hospitalization No Has patient had a PCN reaction occurring within the last 10 years: No If all of the above answers are "NO", then may proceed with Cephalosporin use.   Ampicillin Rash and Other (See Comments)    Has patient had a PCN reaction causing immediate rash, facial/tongue/throat swelling, SOB or lightheadedness with hypotension: No Has patient had a PCN reaction causing severe rash involving mucus membranes or skin necrosis: No Has patient had a PCN reaction that required hospitalization No Has patient had a PCN reaction occurring within the last 10 years: No If all of the above answers are "NO", then may proceed with Cephalosporin use.    Immunization History  Administered Date(s) Administered   Influenza Split 09/10/2012   Influenza, High Dose Seasonal PF 08/10/2017   Influenza,inj,Quad PF,6+ Mos 09/23/2013, 07/28/2014, 09/02/2015, 12/27/2016   PFIZER(Purple Top)SARS-COV-2 Vaccination 09/23/2020, 10/27/2020   Pneumococcal Conjugate-13 04/27/2015   Pneumococcal Polysaccharide-23 09/23/2013   Tetanus 12/30/2013   Zoster, Live 09/20/2012, 10/23/2014  Past Medical History:  Diagnosis Date   Anemia    Anxiety    Arthritis    "everywhere" (08/19/2013)   Bilateral carpal tunnel syndrome    Chest pain at rest, rule out pericarditis 11/27/2012   CHF (congestive heart failure) (HCC)     Chronic kidney disease    dr Isabel Caprice   Chronic lower back pain    Closed displaced fracture of right femoral neck (HCC) 02/18/2019   Complication of anesthesia    "woke up twice during knee replacement" (08/19/2013)   Depression    Dyspnea    w/ exertion     Fibromyalgia    Headache(784.0)    hx of migraines    History of pericarditis, with pericardial window in 2009 11/27/2012   Hx of ovarian cancer 11/27/2012   Hypothyroidism 11/27/2012   Iron deficiency anemia    Kidney carcinoma (HCC)    "both kidneys; ~ 3 yr apart" (08/19/2013)   Lung cancer (HCC) 2018   Melanoma of back (HCC)    Migraines    Pneumonia    "several times; including today", RUL/notes 08/19/2013   Squamous carcinoma    "face, side of my nose, chest; used acid to get rid of them" (08/19/2013)   Venous insufficiency 11/27/2012    Tobacco History: Social History   Tobacco Use  Smoking Status Former   Current packs/day: 0.00   Average packs/day: 1.5 packs/day for 18.0 years (27.0 ttl pk-yrs)   Types: Cigarettes   Start date: 09/23/1967   Quit date: 09/22/1985   Years since quitting: 37.9  Smokeless Tobacco Never   Counseling given: Not Answered   Outpatient Medications Prior to Visit  Medication Sig Dispense Refill   acetaminophen (TYLENOL) 500 MG tablet Take 2 tablets (1,000 mg total) by mouth every 8 (eight) hours. 30 tablet 0   buPROPion (WELLBUTRIN SR) 150 MG 12 hr tablet TAKE 1 TABLET BY MOUTH DAILY (Patient taking differently: Take 150 mg by mouth 2 (two) times daily.) 30 tablet 0   D-Mannose 500 MG CAPS Take 500 mg by mouth daily.     diclofenac Sodium (VOLTAREN) 1 % GEL Apply topically.     ergocalciferol (VITAMIN D2) 1.25 MG (50000 UT) capsule Take 50,000 Units by mouth once a week.     escitalopram (LEXAPRO) 10 MG tablet Take 10 mg by mouth daily.     ferrous sulfate (FERROUSUL) 325 (65 FE) MG tablet Take 1 tablet (325 mg total) by mouth 3 (three) times daily with meals. (Patient taking differently:  Take 325 mg by mouth daily with breakfast.)  3   imiquimod (ALDARA) 5 % cream Apply 1 application topically at bedtime.     levothyroxine (SYNTHROID) 75 MCG tablet Take 75 mcg by mouth daily before breakfast.     methocarbamol (ROBAXIN) 500 MG tablet Take 1 tablet (500 mg total) by mouth every 6 (six) hours as needed for muscle spasms. 40 tablet 0   morphine (MS CONTIN) 30 MG 12 hr tablet Take 1 tablet (30 mg total) by mouth every 12 (twelve) hours. 60 tablet 0   Multiple Vitamin (MULTIVITAMIN WITH MINERALS) TABS tablet Take 1 tablet by mouth daily.     pantoprazole (PROTONIX) 40 MG tablet Take 40 mg by mouth daily.     triamterene-hydrochlorothiazide (MAXZIDE) 75-50 MG tablet Take 1 tablet by mouth daily.     No facility-administered medications prior to visit.     Review of Systems:   Constitutional: No weight loss or gain, night sweats, fevers,  chills, fatigue. +lassitude. HEENT: No headaches, difficulty swallowing, tooth/dental problems, or sore throat. No sneezing, itching, ear ache, nasal congestion, or post nasal drip CV:  No chest pain, orthopnea, PND, swelling in lower extremities, anasarca, dizziness, palpitations, syncope Resp: +shortness of breath with exertion. No excess mucus or change in color of mucus. No productive or non-productive. No hemoptysis. No wheezing.  No chest wall deformity GI:  No heartburn, indigestion, abdominal pain, nausea, vomiting, diarrhea, change in bowel habits, loss of appetite, bloody stools.  GU: No dysuria, change in color of urine, urgency or frequency.  Skin: No rash, lesions, ulcerations MSK:  No joint pain or swelling. Neuro: +gait abnormality, memory impairment. No numbness/tingling.  Psych: No depression or anxiety. Mood stable.     Physical Exam:  BP (!) 166/80 (BP Location: Left Arm, Cuff Size: Normal)   Pulse 87   Ht 5' 5.5" (1.664 m)   Wt 166 lb 9.6 oz (75.6 kg)   SpO2 97%   BMI 27.30 kg/m   GEN: Pleasant, interactive,  well-appearing; in no acute distress HEENT:  Normocephalic and atraumatic. PERRLA. Sclera white. Nasal turbinates pink, moist and patent bilaterally. No rhinorrhea present. Oropharynx pink and moist, without exudate or edema. No lesions, ulcerations, or postnasal drip.  NECK:  Supple w/ fair ROM. No JVD present. Normal carotid impulses w/o bruits. Thyroid symmetrical with no goiter or nodules palpated. No lymphadenopathy.   CV: RRR, no m/r/g, no peripheral edema. Pulses intact, +2 bilaterally. No cyanosis, pallor or clubbing. PULMONARY:  Unlabored, regular breathing. Clear bilaterally A&P w/o wheezes/rales/rhonchi. No accessory muscle use.  GI: BS present and normoactive. Soft, non-tender to palpation. No organomegaly or masses detected. MSK: No erythema, warmth or tenderness. Cap refil <2 sec all extrem. No deformities or joint swelling noted.  Neuro: A/Ox3. Shuffling gait Skin: Warm, no lesions or rashe Psych: Normal affect and behavior. Judgement and thought content appropriate.     Lab Results:  CBC    Component Value Date/Time   WBC 7.5 12/27/2022 1429   WBC 6.6 02/23/2021 1500   RBC 3.81 (L) 12/27/2022 1429   HGB 11.3 (L) 12/27/2022 1429   HGB 11.8 03/13/2018 1427   HGB 12.5 03/07/2010 1052   HCT 34.4 (L) 12/27/2022 1429   HCT 35.9 03/13/2018 1427   HCT 36.6 03/07/2010 1052   PLT 209 12/27/2022 1429   PLT 224 03/13/2018 1427   MCV 90.3 12/27/2022 1429   MCV 87.9 08/28/2018 1125   MCV 87 03/13/2018 1427   MCV 91.8 03/07/2010 1052   MCH 29.7 12/27/2022 1429   MCHC 32.8 12/27/2022 1429   RDW 13.3 12/27/2022 1429   RDW 14.6 03/13/2018 1427   RDW 14.7 (H) 03/07/2010 1052   LYMPHSABS 1.7 12/27/2022 1429   LYMPHSABS 1.9 03/07/2010 1052   MONOABS 0.4 12/27/2022 1429   MONOABS 0.5 03/07/2010 1052   EOSABS 0.1 12/27/2022 1429   EOSABS 0.0 03/07/2010 1052   BASOSABS 0.1 12/27/2022 1429   BASOSABS 0.0 03/07/2010 1052    BMET    Component Value Date/Time   NA 139  12/27/2022 1429   NA 144 03/13/2018 1427   K 4.4 12/27/2022 1429   CL 104 12/27/2022 1429   CO2 27 12/27/2022 1429   GLUCOSE 113 (H) 12/27/2022 1429   BUN 44 (H) 12/27/2022 1429   BUN 42 (H) 03/13/2018 1427   CREATININE 1.90 (H) 12/27/2022 1429   CREATININE 1.33 (H) 09/02/2015 1220   CALCIUM 10.4 (H) 12/27/2022 1429   GFRNONAA 28 (L)  12/27/2022 1429   GFRNONAA 42 (L) 09/02/2015 1220   GFRAA 49 (L) 03/09/2020 2027   GFRAA 55 (L) 11/13/2019 1024   GFRAA 48 (L) 09/02/2015 1220    BNP No results found for: "BNP"   Imaging:  No results found.  Administration History     None          Latest Ref Rng & Units 08/24/2017    1:50 PM  PFT Results  FVC-Pre L 3.02   FVC-Predicted Pre % 91   FVC-Post L 2.97   FVC-Predicted Post % 89   Pre FEV1/FVC % % 86   Post FEV1/FCV % % 89   FEV1-Pre L 2.61   FEV1-Predicted Pre % 103   FEV1-Post L 2.64   DLCO uncorrected ml/min/mmHg 18.63   DLCO UNC% % 69   DLVA Predicted % 84   TLC L 5.51   TLC % Predicted % 103   RV % Predicted % 122     No results found for: "NITRICOXIDE"      Assessment & Plan:   Dyspnea Unclear etiology. Possibly related to deconditioning and potential neurologic disorder with constellation of symptoms. She has a smoking history so possible underlying smoking related obstructive disease.  Will trial her on albuterol to see if she receives benefit from use.  Scheduled for full PFT.  Check CBC to rule out worsening anemia, TSH to ensure her thyroid levels are stable and BNP/BMET to assess for cardiac component.  Will obtain echocardiogram as she never had this completed after her last visit.  CXR today to evaluate for underlying lung disease.  Walk test without desaturations.  Patient Instructions  Trial Albuterol inhaler 2 puffs every 6 hours as needed for shortness of breath or wheezing. Notify if symptoms persist despite rescue inhaler/neb use. Let me know if you notice a huge difference with this and we  can try a longer acting inhaler   Echocardiogram ordered - someone should contact you for scheduling  Labs and chest x ray today   I am going to reach out to your cancer doctor as you haven't seen them in over a year and you were supposed to have a repeat CT scan in September   Your oxygen levels did not drop at all with your walk test today, which is good news.  Follow up in 6-8 weeks after full PFT with Dr. Delton Coombes (1st) or Katie Vearl Aitken,NP. If symptoms do not improve or worsen, please contact office for sooner follow up or seek emergency care.    Adenocarcinoma of right lung, stage 1 (HCC) Stable postoperative changes on CT from March 2024.  She did have a new area of groundglass in the right anterior lung.  She was supposed to have repeat CT in September 2024.  She has been lost to follow-up with oncology since her visit in February 2023.  Will coordinate with oncology team to get her rescheduled for CT and follow-up with them.  Cognitive decline Cognitive decline and progressive weakness over the last year.  Has MRI of the brain scheduled next week.  Follow-up with neurology as scheduled.   I spent 42 minutes of dedicated to the care of this patient on the date of this encounter to include pre-visit review of records, face-to-face time with the patient discussing conditions above, post visit ordering of testing, clinical documentation with the electronic health record, making appropriate referrals as documented, and communicating necessary findings to members of the patients care team.  Noemi Chapel,  NP 08/20/2023  Pt aware and understands NP's role.

## 2023-08-20 NOTE — Assessment & Plan Note (Signed)
Unclear etiology. Possibly related to deconditioning and potential neurologic disorder with constellation of symptoms. She has a smoking history so possible underlying smoking related obstructive disease.  Will trial her on albuterol to see if she receives benefit from use.  Scheduled for full PFT.  Check CBC to rule out worsening anemia, TSH to ensure her thyroid levels are stable and BNP/BMET to assess for cardiac component.  Will obtain echocardiogram as she never had this completed after her last visit.  CXR today to evaluate for underlying lung disease.  Walk test without desaturations.  Patient Instructions  Trial Albuterol inhaler 2 puffs every 6 hours as needed for shortness of breath or wheezing. Notify if symptoms persist despite rescue inhaler/neb use. Let me know if you notice a huge difference with this and we can try a longer acting inhaler   Echocardiogram ordered - someone should contact you for scheduling  Labs and chest x ray today   I am going to reach out to your cancer doctor as you haven't seen them in over a year and you were supposed to have a repeat CT scan in September   Your oxygen levels did not drop at all with your walk test today, which is good news.  Follow up in 6-8 weeks after full PFT with Dr. Delton Coombes (1st) or Katie Chantele Corado,NP. If symptoms do not improve or worsen, please contact office for sooner follow up or seek emergency care.

## 2023-08-21 ENCOUNTER — Telehealth: Payer: Self-pay | Admitting: Internal Medicine

## 2023-08-21 LAB — BASIC METABOLIC PANEL
BUN: 44 mg/dL — ABNORMAL HIGH (ref 6–23)
CO2: 24 meq/L (ref 19–32)
Calcium: 9.6 mg/dL (ref 8.4–10.5)
Chloride: 102 meq/L (ref 96–112)
Creatinine, Ser: 2.3 mg/dL — ABNORMAL HIGH (ref 0.40–1.20)
GFR: 20.5 mL/min — ABNORMAL LOW (ref >60.00)
Glucose, Bld: 72 mg/dL (ref 70–99)
Potassium: 4.3 meq/L (ref 3.5–5.1)
Sodium: 136 meq/L (ref 135–145)

## 2023-08-22 NOTE — Addendum Note (Signed)
Addended by: Noemi Chapel on: 08/22/2023 03:49 PM   Modules accepted: Orders

## 2023-08-22 NOTE — Progress Notes (Signed)
08/22/2023 Addendum: CBC with worsening anemia, 9 g/dL down from 09.81 months ago.  She also has a decline in her kidney function, now with CKD stage IV.  Worsening anemia possibly related to chronic kidney disease but would recommend further workup.  BNP is elevated to 270 pg/mL.  Suspect that this is contributing to her DOE.  Referral placed to nephrology.  Spoke with patient's daughter (DPR).  Encouraged her to contact primary care to discuss next steps.  Verbalized understanding.  All questions answered.  Will keep appointment for echo and repeat PFT.

## 2023-08-30 ENCOUNTER — Ambulatory Visit: Payer: Medicare HMO | Admitting: Internal Medicine

## 2023-08-30 DIAGNOSIS — R270 Ataxia, unspecified: Secondary | ICD-10-CM | POA: Diagnosis not present

## 2023-08-31 ENCOUNTER — Ambulatory Visit
Admission: RE | Admit: 2023-08-31 | Discharge: 2023-08-31 | Disposition: A | Discharge status: Home / Self Care | Payer: Medicare HMO | Source: Ambulatory Visit | Attending: Neurology | Admitting: Neurology

## 2023-08-31 DIAGNOSIS — R270 Ataxia, unspecified: Secondary | ICD-10-CM

## 2023-08-31 DIAGNOSIS — R269 Unspecified abnormalities of gait and mobility: Secondary | ICD-10-CM

## 2023-09-03 NOTE — Progress Notes (Deleted)
[facility] OFFICE PROGRESS NOTE  Tina Harries, MD [address] [place] [address]  DIAGNOSIS: Stage IA (T1a, N0, M0) non-small cell lung cancer, well-differentiated adenocarcinoma diagnosed in November 2018.   PRIOR THERAPY: Status post wedge resection of the right upper lobe under the care of Dr. Dorris Fetch on September 28, 2017.   CURRENT THERAPY: Observation   INTERVAL HISTORY: [name] 72 y.o. female returns to the clinic today for a follow-up visit. The patient was last seen in February 2023. She has been lost to follow up since that time until she was seen recently by pulmonary medicine who realized she had been lost to follow up.  She had her annual restaging CT scan March 2024 but was lost to follow up. Her scan at that time showed stable post treatment changes involving the posterior right upper lobe. There was no new mass lesion, fluid collection or lymph nod enlargement. However, there was minimal ground glass and subtle nodularity in the right lung. One new area right lower lobe other areas are similar to previous. Simple attention on short-term follow-up in 6 months. She is here to re-establish care today.   The patient has been on observation since her surgery in December 2018.  She is feeling fairly well today without any concerning complaints except  ***   She denies any fever, chills, night sweats, or unexplained weight loss.  Denies any chest pain, cough, or hemoptysis. She sometimes has intermittent periods of shortness of breath that come and goes.  She denies any nausea, vomiting, diarrhea, or constipation.  She denies any headaches. She notes she is going to see her eye doctor soon regarding visual changes. She has chronic anemia since she was a teenager. She takes iron supplements.   MEDICAL HISTORY: Past Medical History:  Diagnosis Date   Anemia    Anxiety    Arthritis    "everywhere" (08/19/2013)   Bilateral carpal tunnel  syndrome    Chest pain at rest, rule out pericarditis 11/27/2012   CHF (congestive heart failure) (HCC)    Chronic kidney disease    dr [name]   Chronic lower back pain    Closed displaced fracture of right femoral neck (HCC) 02/18/2019   Complication of anesthesia    "woke up twice during knee replacement" (08/19/2013)   Depression    Dyspnea    w/ exertion     Fibromyalgia    Headache(784.0)    hx of migraines    History of pericarditis, with pericardial window in 2009 11/27/2012   Hx of ovarian cancer 11/27/2012   Hypothyroidism 11/27/2012   Iron deficiency anemia    Kidney carcinoma (HCC)    "both kidneys; ~ 3 yr apart" (08/19/2013)   Lung cancer (HCC) 2018   Melanoma of back (HCC)    Migraines    Pneumonia    "several times; including today", RUL/notes 08/19/2013   Squamous carcinoma    "face, side of my nose, chest; used acid to get rid of them" (08/19/2013)   Venous insufficiency 11/27/2012    ALLERGIES:  is allergic to amoxicillin and ampicillin.  MEDICATIONS:  Current Outpatient Medications  Medication Sig Dispense Refill   acetaminophen (TYLENOL) 500 MG tablet Take 2 tablets (1,000 mg total) by mouth every 8 (eight) hours. 30 tablet 0   albuterol (VENTOLIN HFA) 108 (90 Base) MCG/ACT inhaler Inhale 2 puffs into the lungs every 6 (six) hours as needed for wheezing or shortness of breath. 8  g 2   buPROPion (WELLBUTRIN SR) 150 MG 12 hr tablet TAKE 1 TABLET BY MOUTH DAILY (Patient taking differently: Take 150 mg by mouth 2 (two) times daily.) 30 tablet 0   D-Mannose 500 MG CAPS Take 500 mg by mouth daily.     diclofenac Sodium (VOLTAREN) 1 % GEL Apply topically.     ergocalciferol (VITAMIN D2) 1.25 MG (50000 UT) capsule Take 50,000 Units by mouth once a week.     escitalopram (LEXAPRO) 10 MG tablet Take 10 mg by mouth daily.     ferrous sulfate (FERROUSUL) 325 (65 FE) MG tablet Take 1 tablet (325 mg total) by mouth 3 (three) times daily with meals. (Patient taking differently:  Take 325 mg by mouth daily with breakfast.)  3   imiquimod (ALDARA) 5 % cream Apply 1 application topically at bedtime.     levothyroxine (SYNTHROID) 75 MCG tablet Take 75 mcg by mouth daily before breakfast.     methocarbamol (ROBAXIN) 500 MG tablet Take 1 tablet (500 mg total) by mouth every 6 (six) hours as needed for muscle spasms. 40 tablet 0   morphine (MS CONTIN) 30 MG 12 hr tablet Take 1 tablet (30 mg total) by mouth every 12 (twelve) hours. 60 tablet 0   Multiple Vitamin (MULTIVITAMIN WITH MINERALS) TABS tablet Take 1 tablet by mouth daily.     pantoprazole (PROTONIX) 40 MG tablet Take 40 mg by mouth daily.     triamterene-hydrochlorothiazide (MAXZIDE) 75-50 MG tablet Take 1 tablet by mouth daily.     No current facility-administered medications for this visit.    SURGICAL HISTORY:  Past Surgical History:  Procedure Laterality Date   ABDOMINAL HYSTERECTOMY  1979   ANTERIOR LAT LUMBAR FUSION Left 07/16/2018   Procedure: Left Lumbar two-three  Anterolateral decompression/interbody fusion with lateral plate fixation;  Surgeon: Barnett Abu, MD;  Location: [facility] OR;  Service: Neurosurgery;  Laterality: Left;   APPENDECTOMY     COLON RESECTION  07/2019   DOPPLER ECHOCARDIOGRAPHY  02/23/2011   LVEF>50%   FRACTURE SURGERY     KNEE ARTHROSCOPY Right    "twice before the replacement" (08/19/2013)   LEFT OOPHORECTOMY Left 1979   LUMBAR DISC SURGERY  1984   "ruptured disc"   MELANOMA EXCISION  ~ 2010   "my lower back" (08/19/2013)   ORIF HIP FRACTURE Left    x 2 age 49 and 4   PARTIAL NEPHRECTOMY Right 2005; ~ 2008   PERICARDIAL WINDOW  ~ 2012   POSTERIOR LUMBAR FUSION  2002?; 03/2012   REDUCTION MAMMAPLASTY Bilateral ~ 2008   RIGHT OOPHORECTOMY  ~ 2007   "it had cancer in it" (08/19/2013)   SHOULDER ARTHROSCOPY W/ ROTATOR CUFF REPAIR Right 1990's   TONSILLECTOMY  1954   TOTAL HIP ARTHROPLASTY Right 02/18/2019   Procedure: TOTAL HIP ARTHROPLASTY;  Surgeon: Teryl Lucy, MD;   Location: [facility] OR;  Service: Orthopedics;  Laterality: Right;   TOTAL HIP REVISION Right 03/27/2019   Procedure: TOTAL HIP REVISION;  Surgeon: Durene Romans, MD;  Location: [facility];  Service: Orthopedics;  Laterality: Right;   TOTAL HIP REVISION Right 08/12/2020   Procedure: RIGHT TOTAL HIP REVISION TO CONSTRAINED LINER;  Surgeon: Durene Romans, MD;  Location: [facility];  Service: Orthopedics;  Laterality: Right;  90 mins   TOTAL KNEE ARTHROPLASTY Right 1990's   TOTAL KNEE ARTHROPLASTY Left 08/02/2015   Procedure: LEFT TOTAL KNEE ARTHROPLASTY;  Surgeon: Durene Romans, MD;  Location: [facility];  Service: Orthopedics;  Laterality: Left;  TOTAL SHOULDER REPLACEMENT Left 2006?   TUBAL LIGATION  1952   VIDEO ASSISTED THORACOSCOPY (VATS)/WEDGE RESECTION Right 09/28/2017   Procedure: RIGHT VIDEO ASSISTED THORACOSCOPY (VATS)/WEDGE RESECTION, LYMPH NODE DISSECTION ;  Surgeon: Loreli Slot, MD;  Location: [facility] OR;  Service: Thoracic;  Laterality: Right;    REVIEW OF SYSTEMS:   Review of Systems  Constitutional: Negative for appetite change, chills, fatigue, fever and unexpected weight change.  HENT:   Negative for mouth sores, nosebleeds, sore throat and trouble swallowing.   Eyes: Negative for eye problems and icterus.  Respiratory: Negative for cough, hemoptysis, shortness of breath and wheezing.   Cardiovascular: Negative for chest pain and leg swelling.  Gastrointestinal: Negative for abdominal pain, constipation, diarrhea, nausea and vomiting.  Genitourinary: Negative for bladder incontinence, difficulty urinating, dysuria, frequency and hematuria.   Musculoskeletal: Negative for back pain, gait problem, neck pain and neck stiffness.  Skin: Negative for itching and rash.  Neurological: Negative for dizziness, extremity weakness, gait problem, headaches, light-headedness and seizures.  Hematological: Negative for adenopathy. Does not bruise/bleed easily.  Psychiatric/Behavioral: Negative for  confusion, depression and sleep disturbance. The patient is not nervous/anxious.     PHYSICAL EXAMINATION:  There were no vitals taken for this visit.  ECOG PERFORMANCE STATUS: {CHL ONC ECOG [id number]  Physical Exam  Constitutional: Oriented to person, place, and time and well-developed, well-nourished, and in no distress. No distress.  HENT:  Head: Normocephalic and atraumatic.  Mouth/Throat: Oropharynx is clear and [name]. No oropharyngeal exudate.  Eyes: Conjunctivae are normal. Right eye exhibits no discharge. Left eye exhibits no discharge. No scleral icterus.  Neck: Normal range of motion. Neck supple.  Cardiovascular: Normal rate, regular rhythm, normal heart sounds and intact distal pulses.   Pulmonary/Chest: Effort normal and breath sounds normal. No respiratory distress. No wheezes. No rales.  Abdominal: Soft. Bowel sounds are normal. Exhibits no distension and no mass. There is no tenderness.  Musculoskeletal: Normal range of motion. Exhibits no edema.  Lymphadenopathy:    No cervical adenopathy.  Neurological: Alert and oriented to person, place, and time. Exhibits normal muscle tone. Gait normal. Coordination normal.  Skin: Skin is warm and dry. No rash noted. Not diaphoretic. No erythema. No pallor.  Psychiatric: Mood, memory and judgment normal.  Vitals reviewed.  LABORATORY DATA: Lab Results  Component Value Date   WBC 6.1 08/20/2023   HGB 9.0 (L) 08/20/2023   HCT 28.1 (L) 08/20/2023   MCV 88.2 08/20/2023   PLT 180.0 08/20/2023      Chemistry      Component Value Date/Time   NA 136 08/20/2023 1606   NA 144 03/13/2018 1427   K 4.3 08/20/2023 1606   CL 102 08/20/2023 1606   CO2 24 08/20/2023 1606   BUN 44 (H) 08/20/2023 1606   BUN 42 (H) 03/13/2018 1427   CREATININE 2.30 (H) 08/20/2023 1606   CREATININE 1.90 (H) 12/27/2022 1429   CREATININE 1.33 (H) 09/02/2015 1220      Component Value Date/Time   CALCIUM 9.6 08/20/2023 1606   ALKPHOS 85  12/27/2022 1429   AST 24 12/27/2022 1429   ALT 17 12/27/2022 1429   BILITOT 0.4 12/27/2022 1429       RADIOGRAPHIC STUDIES:  MR CERVICAL SPINE WO CONTRAST  Result Date: 09/01/2023  [facility] [address], [address] [place], [place] [address] [phone] NEUROIMAGING REPORT STUDY DATE: 08/31/2023 PATIENT NAME: Daylen R Heo DOB: 1950/11/18 MRN: [id number] EXAM: MRI of the cervical spine ORDERING CLINICIAN: Gerlene Burdock  Aida Puffer, MD. PhD CLINICAL HISTORY: 72 year old woman with gait disturbance and ataxia COMPARISON FILMS: None available TECHNIQUE: MRI of the cervical spine was obtained utilizing 3 mm sagittal slices from the posterior fossa down to the T3-4 level with T1, T2 and inversion recovery views. In addition 4 mm axial slices from C2-3 down to T1-2 level were included with T2 and gradient echo views. CONTRAST: None IMAGING SITE: [facility], [address] [place], [place], [place] FINDINGS: :  On sagittal images, the spine is imaged from above the cervicomedullary junction to T2.  The visible brain and the cervicomedullary junction appears normal.  Paravertebral soft tissue appears normal.  The spinal cord is of normal caliber and signal.   There is 3 mm anterolisthesis of C3 upon C4 and 2 mm retrolisthesis of C5 upon C6.  There is mild reversal of the cervical curvature.  The vertebral bodies have normal signal.  The discs and interspaces were further evaluated on axial views from C2 to T1 as follows: C2-C3: There is mild disc bulging and minimal uncovertebral spurring but no significant foraminal narrowing.  No spinal stenosis or nerve root compression. C3-C4: There is 3 mm anterolisthesis, disc bulging, mild facet hypertrophy and ligamenta flava hypertrophy combining to cause mild to moderate spinal stenosis (AP diameter 8.3 mm), moderate right greater than left foraminal narrowing.  No definite nerve root compression noted though the degenerative changes encroach upon the C4 nerve  roots. C4-C5: There is disc bulging and mild uncovertebral spurring and mild facet hypertrophy causing mild to moderate left and minimal right foraminal narrowing but no spinal stenosis or nerve root compression. C5-C6: There is trace retrolisthesis, disc bulging, uncovertebral spurring and mild facet hypertrophy and ligamenta flava hypertrophy causing moderate spinal stenosis and moderate right greater than left foraminal narrowing.  There is no definite nerve root compression of the degenerative changes encroach upon the C6 nerve roots C6-C7: There is broad disc protrusion, uncovertebral spurring, facet hypertrophy and ligamenta flava hypertrophy causing moderate spinal stenosis, moderate left foraminal narrowing and mild to moderate right foraminal narrowing.  There does not appear to be any nerve root compression. C7-T1: This level appears normal.   This MRI of the cervical spine without contrast shows the following: The spinal cord is normal signal.  At C3-C4, there is mild anterolisthesis and other degenerative change causing mild to moderate spinal stenosis and moderate right greater than left foraminal narrowing.  There is no definite nerve root compression, the degenerative changes encroach upon the C4 nerve roots. At C5-C6, there is trace retrolisthesis and other degenerative change causing moderate spinal stenosis and moderate right greater than left foraminal narrowing.  There is no definite nerve root compression though the degenerative changes encroach upon the C6 nerve roots. At C6-C7, there are degenerative changes causing moderate spinal stenosis and moderate left greater than right foraminal narrowing.  There is no nerve root compression. Milder degenerative changes at the other cervical levels as detailed above not leading to spinal stenosis or nerve root compression. INTERPRETING PHYSICIAN: Richard A. Epimenio Foot, MD, PhD, FAAN Certified in  Neuroimaging by [employer]   MR  BRAIN WO CONTRAST  Result Date: 09/01/2023  [facility] [address] [place], [place] [address] [phone] NEUROIMAGING REPORT STUDY DATE: 08/31/2023 PATIENT NAME: Jakai R Teague DOB: Mar 15, 1951 MRN: [id number] EXAM: MRI Brain without contrast ORDERING CLINICIAN: Richard A. Epimenio Foot, MD. PhD CLINICAL HISTORY: 72 year old woman with ataxia COMPARISON FILMS: 02/11/2010 TECHNIQUE: MRI of the brain without contrast  was obtained utilizing 5 mm axial slices with T1, T2, T2 flair, SWI and diffusion weighted views.  T1 sagittal and T2 coronal views were obtained. CONTRAST: none IMAGING SITE: [facility], [address] [place], [place] FINDINGS: On sagittal images, the spinal cord is imaged caudally to C3 and is normal in caliber.   The contents of the posterior fossa are of normal size and position.   The pituitary gland and optic chiasm appear normal.    Mild generalized cortical atrophy, progressed compared to the 2011 MRI.[place]  There are no abnormal extra-axial collections of fluid.  Few scattered T2/FLAIR hyperintense foci in the hemispheres consistent with minimal chronic microvascular ischemic changes associated with aging and progressed compared to the 2011 MRI.  The cerebellum and brainstem appears normal.   The deep gray matter appears normal. Diffusion weighted images are normal.  Susceptibility weighted images are normal.  The orbits appear normal.   The VIIth/VIIIth nerve complex appears normal.  The mastoid air cells appear normal.  The paranasal sinuses appear normal.  Flow voids are identified within the major intracerebral arteries.     This MRI of the brain without contrast shows the following: Mild generalized cortical atrophy, slightly more than typical for age and progressed compared to the 2011 MRI. Few scattered T2/FLAIR hyperintense foci in the hemispheres consistent with mild chronic microvascular ischemic change that would be normal for age. No acute findings.  INTERPRETING PHYSICIAN: Richard A. Epimenio Foot, MD, PhD, FAAN Certified in  Neuroimaging by [employer]     ASSESSMENT/PLAN:  This is a very pleasant 72 year old Caucasian female with a history of stage Ia non-small cell lung cancer, adenocarcinoma.  She is status post a wedge resection of the right upper lobe which was performed under the care of Dr. Dorris Fetch on September 28, 2017.  The patient has been on observation since that time and feeling well. She was lost to follow up since 2023.   She had a repeat CT in 2024 showing ***  The patient was seen with Dr. Arbutus Ped. Dr. Arbutus Ped recommends arranging for repeat CT scan.   ***We will see her back to review the scan in   Patient recently had a restaging CT scan performed.  Dr. Arbutus Ped personally and independently reviewed the scan and discussed results with the patient today.  The scan did not show any evidence of disease progression or recurrent disease.  Dr. Arbutus Ped recommend that the patient continue on observation with a restaging CT scan of the chest in 1 year.  She will continue to take her iron supplement for her stable but chronic anemia.    She will follow with her eye doctor for her visual changes.    I advised her to talk to her family doctor about recommendations for management of her insomnia. She is presently taking several medications that should cause drowsiness. She reports she has been on [place] for several years.    No orders of the defined types were placed in this encounter.    I spent {CHL ONC TIME VISIT - ZOXWR:[id number]} counseling the patient face to face. The total time spent in the appointment was {CHL ONC TIME VISIT - BJYNW:[id number]}.  Solenne Manwarren L Jendayi Berling, PA-C 09/03/23

## 2023-09-06 ENCOUNTER — Inpatient Hospital Stay: Payer: Medicare HMO

## 2023-09-06 ENCOUNTER — Telehealth: Payer: Self-pay | Admitting: Medical Oncology

## 2023-09-06 ENCOUNTER — Other Ambulatory Visit: Payer: Self-pay | Admitting: Physician Assistant

## 2023-09-06 ENCOUNTER — Inpatient Hospital Stay: Payer: Medicare HMO | Admitting: Physician Assistant

## 2023-09-06 ENCOUNTER — Other Ambulatory Visit: Payer: Self-pay

## 2023-09-06 ENCOUNTER — Other Ambulatory Visit: Payer: Self-pay | Admitting: Medical Oncology

## 2023-09-06 DIAGNOSIS — D649 Anemia, unspecified: Secondary | ICD-10-CM

## 2023-09-06 DIAGNOSIS — C3491 Malignant neoplasm of unspecified part of right bronchus or lung: Secondary | ICD-10-CM

## 2023-09-06 NOTE — Telephone Encounter (Signed)
Gave CS number to dtr. To schedule CT scan.

## 2023-09-06 NOTE — Progress Notes (Unsigned)
Dtr to scheduled Ct scan

## 2023-09-19 ENCOUNTER — Ambulatory Visit (HOSPITAL_BASED_OUTPATIENT_CLINIC_OR_DEPARTMENT_OTHER): Payer: Medicare HMO

## 2023-09-19 ENCOUNTER — Other Ambulatory Visit: Payer: Self-pay

## 2023-09-19 ENCOUNTER — Inpatient Hospital Stay: Payer: Medicare HMO | Attending: Internal Medicine

## 2023-09-19 ENCOUNTER — Ambulatory Visit (HOSPITAL_COMMUNITY)
Admission: RE | Admit: 2023-09-19 | Discharge: 2023-09-19 | Disposition: A | Discharge status: Home / Self Care | Payer: Medicare HMO | Source: Ambulatory Visit | Attending: Physician Assistant

## 2023-09-19 ENCOUNTER — Other Ambulatory Visit: Payer: Self-pay | Admitting: Physician Assistant

## 2023-09-19 DIAGNOSIS — R0609 Other forms of dyspnea: Secondary | ICD-10-CM | POA: Insufficient documentation

## 2023-09-19 DIAGNOSIS — I509 Heart failure, unspecified: Secondary | ICD-10-CM | POA: Diagnosis not present

## 2023-09-19 DIAGNOSIS — C3491 Malignant neoplasm of unspecified part of right bronchus or lung: Secondary | ICD-10-CM

## 2023-09-19 DIAGNOSIS — D649 Anemia, unspecified: Secondary | ICD-10-CM | POA: Insufficient documentation

## 2023-09-19 DIAGNOSIS — Z85118 Personal history of other malignant neoplasm of bronchus and lung: Secondary | ICD-10-CM | POA: Insufficient documentation

## 2023-09-19 LAB — CMP (CANCER CENTER ONLY)
ALT: 14 U/L (ref 0–44)
AST: 23 U/L (ref 15–41)
Albumin: 4 g/dL (ref 3.5–5.0)
Alkaline Phosphatase: 71 U/L (ref 38–126)
Anion gap: 7 (ref 5–15)
BUN: 52 mg/dL — ABNORMAL HIGH (ref 8–23)
CO2: 29 mmol/L (ref 22–32)
Calcium: 10.5 mg/dL — ABNORMAL HIGH (ref 8.9–10.3)
Chloride: 100 mmol/L (ref 98–111)
Creatinine: 2.5 mg/dL — ABNORMAL HIGH (ref 0.44–1.00)
GFR, Estimated: 20 mL/min — ABNORMAL LOW (ref >60)
Glucose, Bld: 84 mg/dL (ref 70–99)
Potassium: 4.2 mmol/L (ref 3.5–5.1)
Sodium: 136 mmol/L (ref 135–145)
Total Bilirubin: 0.4 mg/dL (ref <1.2)
Total Protein: 6.9 g/dL (ref 6.5–8.1)

## 2023-09-19 LAB — ECHOCARDIOGRAM COMPLETE
Area-P 1/2: 3.53 cm2
S' Lateral: 3.3 cm

## 2023-09-19 LAB — IRON AND IRON BINDING CAPACITY (CC-WL,HP ONLY)
Iron: 41 ug/dL (ref 28–170)
Saturation Ratios: 9 % — ABNORMAL LOW (ref 10.4–31.8)
TIBC: 458 ug/dL — ABNORMAL HIGH (ref 250–450)
UIBC: 417 ug/dL (ref 148–442)

## 2023-09-19 LAB — FERRITIN: Ferritin: 14 ng/mL (ref 11–307)

## 2023-09-25 ENCOUNTER — Ambulatory Visit: Payer: Medicare HMO | Admitting: Physician Assistant

## 2023-09-25 ENCOUNTER — Other Ambulatory Visit: Payer: Medicare HMO

## 2023-09-27 ENCOUNTER — Ambulatory Visit: Payer: Medicare HMO | Admitting: Internal Medicine

## 2023-09-27 ENCOUNTER — Other Ambulatory Visit: Payer: Medicare HMO

## 2023-10-10 ENCOUNTER — Telehealth: Payer: Self-pay | Admitting: Medical Oncology

## 2023-10-10 NOTE — Telephone Encounter (Signed)
Worsening mental status-Dtr called -Dashae is stumbling around , still falling all the time. Symptoms getting worse.  Enid Derry  cancelled her appt with [facility] for tomorrow.   Got lost -Silveria left [place] to drive to PT and never got there. She got lost . She was in [place] and said she was at [employer] . Her granddtr went to [employer] and followed her home and noticed at times Carisha  was not driving in her lane. Some weeks she will go to bed for 3 days and then "pop-up and be  wonderful".  I instructed Carole contact PCP.

## 2023-10-10 NOTE — Telephone Encounter (Signed)
Refused ED-[name] told me that [name]'s PCP told her  to take [name]  to ED. [name] said pt refused to go because she would have to sit too long in the waiting room   I told [name] what to tell ED . [name] [name] said that pt will be here tomorrow for her appt.  ED precautions given to [name].

## 2023-10-11 ENCOUNTER — Inpatient Hospital Stay: Payer: Medicare HMO | Attending: Internal Medicine

## 2023-10-11 ENCOUNTER — Inpatient Hospital Stay: Payer: Medicare HMO | Admitting: Internal Medicine

## 2023-10-11 VITALS — BP 114/58 | HR 97 | Temp 98.3°F | Resp 16 | Ht 65.5 in | Wt 155.2 lb

## 2023-10-11 DIAGNOSIS — C3491 Malignant neoplasm of unspecified part of right bronchus or lung: Secondary | ICD-10-CM

## 2023-10-11 DIAGNOSIS — D649 Anemia, unspecified: Secondary | ICD-10-CM | POA: Diagnosis not present

## 2023-10-11 DIAGNOSIS — Z902 Acquired absence of lung [part of]: Secondary | ICD-10-CM | POA: Insufficient documentation

## 2023-10-11 DIAGNOSIS — Z08 Encounter for follow-up examination after completed treatment for malignant neoplasm: Secondary | ICD-10-CM | POA: Insufficient documentation

## 2023-10-11 DIAGNOSIS — C3490 Malignant neoplasm of unspecified part of unspecified bronchus or lung: Secondary | ICD-10-CM | POA: Diagnosis not present

## 2023-10-11 DIAGNOSIS — Z85118 Personal history of other malignant neoplasm of bronchus and lung: Secondary | ICD-10-CM | POA: Insufficient documentation

## 2023-10-11 LAB — CBC WITH DIFFERENTIAL (CANCER CENTER ONLY)
Abs Immature Granulocytes: 0.02 10*3/uL (ref 0.00–0.07)
Basophils Absolute: 0.1 10*3/uL (ref 0.0–0.1)
Basophils Relative: 1 %
Eosinophils Absolute: 0.1 10*3/uL (ref 0.0–0.5)
Eosinophils Relative: 1 %
HCT: 29.8 % — ABNORMAL LOW (ref 36.0–46.0)
Hemoglobin: 9.5 g/dL — ABNORMAL LOW (ref 12.0–15.0)
Immature Granulocytes: 0 %
Lymphocytes Relative: 15 %
Lymphs Abs: 1.2 10*3/uL (ref 0.7–4.0)
MCH: 27.5 pg (ref 26.0–34.0)
MCHC: 31.9 g/dL (ref 30.0–36.0)
MCV: 86.4 fL (ref 80.0–100.0)
Monocytes Absolute: 0.6 10*3/uL (ref 0.1–1.0)
Monocytes Relative: 8 %
Neutro Abs: 6.1 10*3/uL (ref 1.7–7.7)
Neutrophils Relative %: 75 %
Platelet Count: 182 10*3/uL (ref 150–400)
RBC: 3.45 MIL/uL — ABNORMAL LOW (ref 3.87–5.11)
RDW: 15.3 % (ref 11.5–15.5)
WBC Count: 8.1 10*3/uL (ref 4.0–10.5)
nRBC: 0 % (ref 0.0–0.2)

## 2023-10-11 LAB — SAMPLE TO BLOOD BANK

## 2023-10-11 NOTE — Progress Notes (Signed)
[facility] [phone]   [phone]  OFFICE PROGRESS NOTE  Tracey Harries, MD [address] [place] [address]  DIAGNOSIS: Stage IA (T1a, N0, M0) non-small cell lung cancer, well-differentiated adenocarcinoma diagnosed in November 2018.  PRIOR THERAPY:  Status post wedge resection of the right upper lobe under the care of Dr. Dorris Fetch on September 28, 2017.  CURRENT THERAPY: Observation.  INTERVAL HISTORY: [name] 72 y.o. female returns to the clinic today for annual follow-up visit accompanied by her daughter.Discussed the use of AI scribe software for clinical note transcription with the patient, who gave verbal consent to proceed.  History of Present Illness   The patient, a 72 year old with a history of stage 1 lung cancer, presents with progressive weakness, shortness of breath, and cognitive changes. She reports difficulty walking without assistance and becoming easily fatigued, with symptoms of dyspnea on exertion. She also describes episodes of confusion, including an incident of becoming lost while driving. The patient notes intermittent discoloration of her feet, with toes turning black at times. She also complains of a persistently dry, burning mouth.  The patient has a history of lung cancer, for which she underwent lobectomy in December 2018. Since then, she has been under surveillance with annual scans. The most recent scan, conducted last month, showed no concerning findings.  The patient also has a history of anemia, which has been stable over the past few years. She has been taking over-the-counter iron supplements once a day. She also reports taking vitamin B12 supplements. The patient underwent a colectomy a few years ago due to an obstruction, which may affect her absorption of iron.  The patient's kidney function is chronically elevated, and she is under the care of a nephrologist. She also has a  cardiologist, and recent tests for congestive heart failure were negative. Despite these multiple health issues, there is no clear cause for the patient's current symptoms of weakness, shortness of breath, and confusion.         MEDICAL HISTORY: Past Medical History:  Diagnosis Date   Anemia    Anxiety    Arthritis    "everywhere" (08/19/2013)   Bilateral carpal tunnel syndrome    Chest pain at rest, rule out pericarditis 11/27/2012   CHF (congestive heart failure) (HCC)    Chronic kidney disease    dr Isabel Caprice   Chronic lower back pain    Closed displaced fracture of right femoral neck (HCC) 02/18/2019   Complication of anesthesia    "woke up twice during knee replacement" (08/19/2013)   Depression    Dyspnea    w/ exertion     Fibromyalgia    Headache(784.0)    hx of migraines    History of pericarditis, with pericardial window in 2009 11/27/2012   Hx of ovarian cancer 11/27/2012   Hypothyroidism 11/27/2012   Iron deficiency anemia    Kidney carcinoma (HCC)    "both kidneys; ~ 3 yr apart" (08/19/2013)   Lung cancer (HCC) 2018   Melanoma of back (HCC)    Migraines    Pneumonia    "several times; including today", RUL/notes 08/19/2013   Squamous carcinoma    "face, side of my nose, chest; used acid to get rid of them" (08/19/2013)   Venous insufficiency 11/27/2012    ALLERGIES:  is allergic to amoxicillin and ampicillin.  MEDICATIONS:  Current Outpatient Medications  Medication Sig Dispense Refill   acetaminophen (TYLENOL) 500 MG tablet  Take 2 tablets (1,000 mg total) by mouth every 8 (eight) hours. 30 tablet 0   albuterol (VENTOLIN HFA) 108 (90 Base) MCG/ACT inhaler Inhale 2 puffs into the lungs every 6 (six) hours as needed for wheezing or shortness of breath. 8 g 2   buPROPion (WELLBUTRIN SR) 150 MG 12 hr tablet TAKE 1 TABLET BY MOUTH DAILY (Patient taking differently: Take 150 mg by mouth 2 (two) times daily.) 30 tablet 0   D-Mannose 500 MG CAPS Take 500 mg by mouth daily.      diclofenac Sodium (VOLTAREN) 1 % GEL Apply topically.     ergocalciferol (VITAMIN D2) 1.25 MG (50000 UT) capsule Take 50,000 Units by mouth once a week.     escitalopram (LEXAPRO) 10 MG tablet Take 10 mg by mouth daily.     ferrous sulfate (FERROUSUL) 325 (65 FE) MG tablet Take 1 tablet (325 mg total) by mouth 3 (three) times daily with meals. (Patient taking differently: Take 325 mg by mouth daily with breakfast.)  3   imiquimod (ALDARA) 5 % cream Apply 1 application topically at bedtime.     levothyroxine (SYNTHROID) 75 MCG tablet Take 75 mcg by mouth daily before breakfast.     methocarbamol (ROBAXIN) 500 MG tablet Take 1 tablet (500 mg total) by mouth every 6 (six) hours as needed for muscle spasms. 40 tablet 0   morphine (MS CONTIN) 30 MG 12 hr tablet Take 1 tablet (30 mg total) by mouth every 12 (twelve) hours. 60 tablet 0   Multiple Vitamin (MULTIVITAMIN WITH MINERALS) TABS tablet Take 1 tablet by mouth daily.     pantoprazole (PROTONIX) 40 MG tablet Take 40 mg by mouth daily.     triamterene-hydrochlorothiazide (MAXZIDE) 75-50 MG tablet Take 1 tablet by mouth daily.     No current facility-administered medications for this visit.    SURGICAL HISTORY:  Past Surgical History:  Procedure Laterality Date   ABDOMINAL HYSTERECTOMY  1979   ANTERIOR LAT LUMBAR FUSION Left 07/16/2018   Procedure: Left Lumbar two-three  Anterolateral decompression/interbody fusion with lateral plate fixation;  Surgeon: Barnett Abu, MD;  Location: [facility] OR;  Service: Neurosurgery;  Laterality: Left;   APPENDECTOMY     COLON RESECTION  07/2019   DOPPLER ECHOCARDIOGRAPHY  02/23/2011   LVEF>50%   FRACTURE SURGERY     KNEE ARTHROSCOPY Right    "twice before the replacement" (08/19/2013)   LEFT OOPHORECTOMY Left 1979   LUMBAR DISC SURGERY  1984   "ruptured disc"   MELANOMA EXCISION  ~ 2010   "my lower back" (08/19/2013)   ORIF HIP FRACTURE Left    x 2 age 32 and 4   PARTIAL NEPHRECTOMY Right 2005; ~ 2008    PERICARDIAL WINDOW  ~ 2012   POSTERIOR LUMBAR FUSION  2002?; 03/2012   REDUCTION MAMMAPLASTY Bilateral ~ 2008   RIGHT OOPHORECTOMY  ~ 2007   "it had cancer in it" (08/19/2013)   SHOULDER ARTHROSCOPY W/ ROTATOR CUFF REPAIR Right 1990's   TONSILLECTOMY  1954   TOTAL HIP ARTHROPLASTY Right 02/18/2019   Procedure: TOTAL HIP ARTHROPLASTY;  Surgeon: Teryl Lucy, MD;  Location: [facility] OR;  Service: Orthopedics;  Laterality: Right;   TOTAL HIP REVISION Right 03/27/2019   Procedure: TOTAL HIP REVISION;  Surgeon: Durene Romans, MD;  Location: [facility];  Service: Orthopedics;  Laterality: Right;   TOTAL HIP REVISION Right 08/12/2020   Procedure: RIGHT TOTAL HIP REVISION TO CONSTRAINED LINER;  Surgeon: Durene Romans, MD;  Location: [facility];  Service: Orthopedics;  Laterality: Right;  90 mins   TOTAL KNEE ARTHROPLASTY Right 1990's   TOTAL KNEE ARTHROPLASTY Left 08/02/2015   Procedure: LEFT TOTAL KNEE ARTHROPLASTY;  Surgeon: Durene Romans, MD;  Location: [facility];  Service: Orthopedics;  Laterality: Left;   TOTAL SHOULDER REPLACEMENT Left 2006?   TUBAL LIGATION  1952   VIDEO ASSISTED THORACOSCOPY (VATS)/WEDGE RESECTION Right 09/28/2017   Procedure: RIGHT VIDEO ASSISTED THORACOSCOPY (VATS)/WEDGE RESECTION, LYMPH NODE DISSECTION ;  Surgeon: Loreli Slot, MD;  Location: [facility] OR;  Service: Thoracic;  Laterality: Right;    REVIEW OF SYSTEMS:  Constitutional: positive for fatigue Eyes: negative Ears, nose, mouth, throat, and face: negative Respiratory: positive for dyspnea on exertion Cardiovascular: negative Gastrointestinal: negative Genitourinary:negative Integument/breast: negative Hematologic/lymphatic: negative Musculoskeletal:positive for muscle weakness Neurological: negative Behavioral/Psych: negative Endocrine: negative Allergic/Immunologic: negative   PHYSICAL EXAMINATION: General appearance: alert, cooperative, fatigued, and no distress Head: Normocephalic, without obvious abnormality,  atraumatic Neck: no adenopathy, no JVD, supple, symmetrical, trachea midline, and thyroid not enlarged, symmetric, no tenderness/mass/nodules Lymph nodes: Cervical, supraclavicular, and axillary nodes normal. Resp: clear to auscultation bilaterally Back: symmetric, no curvature. ROM normal. No CVA tenderness. Cardio: regular rate and rhythm, S1, S2 normal, no murmur, click, rub or gallop GI: soft, non-tender; bowel sounds normal; no masses,  no organomegaly Extremities: extremities normal, atraumatic, no cyanosis or edema Neurologic: Alert and oriented X 3, normal strength and tone. Normal symmetric reflexes. Normal coordination and gait  ECOG PERFORMANCE STATUS: 1 - Symptomatic but completely ambulatory  Blood pressure (!) 114/58, pulse 97, temperature 98.3 F (36.8 C), temperature source Temporal, resp. rate 16, height 5' 5.5" (1.664 m), weight 155 lb 3.2 oz (70.4 kg), SpO2 98%.  LABORATORY DATA: Lab Results  Component Value Date   WBC 6.1 08/20/2023   HGB 9.0 (L) 08/20/2023   HCT 28.1 (L) 08/20/2023   MCV 88.2 08/20/2023   PLT 180.0 08/20/2023      Chemistry      Component Value Date/Time   NA 136 09/19/2023 1320   NA 144 03/13/2018 1427   K 4.2 09/19/2023 1320   CL 100 09/19/2023 1320   CO2 29 09/19/2023 1320   BUN 52 (H) 09/19/2023 1320   BUN 42 (H) 03/13/2018 1427   CREATININE 2.50 (H) 09/19/2023 1320   CREATININE 1.33 (H) 09/02/2015 1220      Component Value Date/Time   CALCIUM 10.5 (H) 09/19/2023 1320   ALKPHOS 71 09/19/2023 1320   AST 23 09/19/2023 1320   ALT 14 09/19/2023 1320   BILITOT 0.4 09/19/2023 1320       RADIOGRAPHIC STUDIES: CT CHEST WO CONTRAST Result Date: 10/05/2023 CLINICAL DATA:  Non-small cell lung cancer, nonmetastatic, assess treatment response. * Tracking Code: BO * EXAM: CT CHEST WITHOUT CONTRAST TECHNIQUE: Multidetector CT imaging of the chest was performed following the standard protocol without IV contrast. RADIATION DOSE REDUCTION:  This exam was performed according to the departmental dose-optimization program which includes automated exposure control, adjustment of the mA and/or kV according to patient size and/or use of iterative reconstruction technique. COMPARISON:  12/27/2022 FINDINGS: Cardiovascular: Atherosclerotic calcification of the aorta, aortic valve and coronary arteries. Heart is at the upper limits of normal in size to mildly enlarged. No pericardial effusion. Mediastinum/Nodes: No pathologically enlarged mediastinal or axillary lymph nodes. Hilar regions are difficult to definitively evaluate without IV contrast. Esophagus is grossly unremarkable. Lungs/Pleura: Postoperative scarring related to right upper lobe wedge resection. No suspicious pulmonary nodules. No pleural fluid. Airway is unremarkable. Upper  Abdomen: Small low-attenuation lesion in the left kidney. No specific follow-up necessary. Musculoskeletal: Degenerative changes in the spine. L1 vertebral body augmentation. Partially imaged L2 hardware. Old anterior right rib fracture. Left shoulder arthroplasty. Advanced degenerative changes in the right shoulder. Old T12 compression fracture. IMPRESSION: 1. Right upper lobe wedge resection. No evidence of recurrent or metastatic disease. 2. Aortic atherosclerosis (ICD10-I70.0). Coronary artery calcification. Electronically Signed   By: Leanna Battles M.D.   On: 10/05/2023 11:58   ECHOCARDIOGRAM COMPLETE Result Date: 09/19/2023    ECHOCARDIOGRAM REPORT   Patient Name:   [name] Date of Exam: 09/19/2023 Medical Rec #:  [id number]     Height:       65.5 in Accession #:    [id number]    Weight:       166.6 lb Date of Birth:  24-Apr-1951     BSA:          1.841 m Patient Age:    72 years      BP:           127/87 mmHg Patient Gender: F             HR:           86 bpm. Exam Location:  [address] Procedure: 2D Echo, Cardiac Doppler, Color Doppler and 3D Echo Indications:    Dyspnea R06.00  History:        Patient  has prior history of Echocardiogram examinations, most                 recent 10/20/2013. CHF; CKD.  Sonographer:    Thurman Coyer RDCS Referring Phys: [id number] Ruby [employer]a COBB IMPRESSIONS  1. Left ventricular ejection fraction, by estimation, is 55 to 60%. The left ventricle has normal function. The left ventricle has no regional wall motion abnormalities. Left ventricular diastolic parameters are consistent with Grade I diastolic dysfunction (impaired relaxation).  2. Right ventricular systolic function is normal. The right ventricular size is normal. There is normal pulmonary artery systolic pressure.  3. Left atrial size was mildly dilated.  4. The mitral valve is abnormal. Mild mitral valve regurgitation. No evidence of mitral stenosis.  5. The aortic valve is tricuspid. There is mild calcification of the aortic valve. Aortic valve regurgitation is trivial. Aortic valve sclerosis is present, with no evidence of aortic valve stenosis.  6. The inferior vena cava is normal in size with greater than 50% respiratory variability, suggesting right atrial pressure of 3 mmHg. FINDINGS  Left Ventricle: Left ventricular ejection fraction, by estimation, is 55 to 60%. The left ventricle has normal function. The left ventricle has no regional wall motion abnormalities. The left ventricular internal cavity size was normal in size. There is  no left ventricular hypertrophy. Left ventricular diastolic parameters are consistent with Grade I diastolic dysfunction (impaired relaxation). Right Ventricle: The right ventricular size is normal. No increase in right ventricular wall thickness. Right ventricular systolic function is normal. There is normal pulmonary artery systolic pressure. The tricuspid regurgitant velocity is 2.04 m/s, and  with an assumed right atrial pressure of 3 mmHg, the estimated right ventricular systolic pressure is 19.6 mmHg. Left Atrium: Left atrial size was mildly dilated. Right Atrium: Right atrial  size was normal in size. Pericardium: There is no evidence of pericardial effusion. Mitral Valve: The mitral valve is abnormal. There is mild thickening of the mitral valve leaflet(s). There is mild calcification of the mitral valve leaflet(s). Mild mitral valve regurgitation. No evidence  of mitral valve stenosis. Tricuspid Valve: The tricuspid valve is normal in structure. Tricuspid valve regurgitation is mild . No evidence of tricuspid stenosis. Aortic Valve: The aortic valve is tricuspid. There is mild calcification of the aortic valve. Aortic valve regurgitation is trivial. Aortic valve sclerosis is present, with no evidence of aortic valve stenosis. Pulmonic Valve: The pulmonic valve was normal in structure. Pulmonic valve regurgitation is not visualized. No evidence of pulmonic stenosis. Aorta: The aortic root is normal in size and structure. Venous: The inferior vena cava is normal in size with greater than 50% respiratory variability, suggesting right atrial pressure of 3 mmHg. IAS/Shunts: No atrial level shunt detected by color flow Doppler.  LEFT VENTRICLE PLAX 2D LVIDd:         4.50 cm   Diastology LVIDs:         3.30 cm   LV e' medial:    7.42 cm/s LV PW:         1.00 cm   LV E/e' medial:  6.8 LV IVS:        0.80 cm   LV e' lateral:   11.20 cm/s LVOT diam:     2.10 cm   LV E/e' lateral: 4.5 LV SV:         47 LV SV Index:   25 LVOT Area:     3.46 cm                           3D Volume EF:                          3D EF:        57 %                          LV EDV:       125 ml                          LV ESV:       54 ml                          LV SV:        71 ml RIGHT VENTRICLE            IVC RV Basal diam:  2.40 cm    IVC diam: 1.70 cm RV Mid diam:    2.20 cm RV S prime:     7.80 cm/s TAPSE (M-mode): 1.3 cm LEFT ATRIUM             Index        RIGHT ATRIUM          Index LA diam:        3.70 cm 2.01 cm/m   RA Area:     9.26 cm LA Vol (A2C):   34.5 ml 18.74 ml/m  RA Volume:   15.10 ml 8.20 ml/m LA  Vol (A4C):   35.4 ml 19.23 ml/m LA Biplane Vol: 38.2 ml 20.75 ml/m  AORTIC VALVE LVOT Vmax:   69.20 cm/s LVOT Vmean:  48.800 cm/s LVOT VTI:    0.135 m  AORTA Ao Root diam: 2.90 cm Ao Asc diam:  3.40 cm MITRAL VALVE               TRICUSPID VALVE MV  Area (PHT): 3.53 cm    TR Peak grad:   16.6 mmHg MV Decel Time: 215 msec    TR Vmax:        204.00 cm/s MV E velocity: 50.80 cm/s MV A velocity: 95.90 cm/s  SHUNTS MV E/A ratio:  0.53        Systemic VTI:  0.14 m                            Systemic Diam: 2.10 cm Charlton Haws MD Electronically signed by Charlton Haws MD Signature Date/Time: 09/19/2023/3:44:29 PM    Final     ASSESSMENT AND PLAN: This is a very pleasant 72 years old white female with history of a stage IA non-small cell lung cancer, adenocarcinoma status post wedge resection of the right upper lobe in December 2018.   The patient has been in observation since that time and no concerning complaints except for the increasing fatigue and weakness in her lower extremities recently.  She had extensive workup that was unrevealing including MRI of the brain that was unremarkable for any disease metastasis to the brain.  She also had CT scan of the chest performed recently that showed no evidence for disease recurrence.    Lung Cancer (Stage I) Stage I lung cancer diagnosed in November 2018, treated with lobectomy in December 2018. Recent chest CT and brain MRI show no evidence of metastasis. Current symptoms of weakness, confusion, and difficulty walking are not attributed to cancer recurrence. Symptoms may be due to age-related changes or other medical conditions. - Continue annual surveillance with chest CT - Follow up in one year  Anemia of Chronic Disease Chronic anemia with hemoglobin levels fluctuating between 9.5 and 11.3 over the past few years, likely multifactorial due to chronic kidney disease and history of colon resection. Anemia can cause fatigue and weakness. Iron absorption may be  impaired due to colon resection. - Continue iron supplements once daily - Take iron with vitamin C or orange juice to enhance absorption - Consider increasing iron supplementation if symptoms persist - Continue vitamin B12 supplementation  Chronic Kidney Disease Elevated kidney function markers. Chronic kidney disease contributing to anemia and overall fatigue. - Continue follow-up with nephrologist in January  Hypercalcemia Mildly elevated calcium levels. No current intake of calcium supplements or excessive dietary calcium. Monitoring calcium levels to avoid complications. - Monitor calcium levels - Avoid calcium supplements and excessive dietary calcium  Hypomagnesemia Low magnesium level (1.4). Importance of magnesium supplementation discussed. - Take magnesium oxide or magnesium citrate, 1-2 tablets daily  General Health Maintenance Multiple chronic conditions requiring ongoing management. Encouraged to maintain physical activity to improve overall health. Benefits of mild exercise to improve strength and reduce fatigue discussed. - Engage in mild exercise, such as walking 15-20 minutes daily - Continue taking multivitamins and supplements as prescribed  Follow-up - Follow up with primary care physician, cardiologist, nephrologist, and neurologist as scheduled - Return to hematology/oncology in one year for annual surveillance.   The patient was advised to call immediately if she has any concerning symptoms in the interval. The patient voices understanding of current disease status and treatment options and is in agreement with the current care plan. All questions were answered. The patient knows to call the clinic with any problems, questions or concerns. We can certainly see the patient much sooner if necessary. The total time spent in the appointment was 30 minutes.   Disclaimer: This note  was dictated with voice recognition software. Similar sounding words can inadvertently  be transcribed and may not be corrected upon review.

## 2023-10-12 ENCOUNTER — Telehealth: Payer: Self-pay | Admitting: Neurology

## 2023-10-12 NOTE — Telephone Encounter (Signed)
Pt's daughter is asking for a call to discuss pt's symptoms worsening, she is wanting to talk RN before scheduling an appointment for pt.

## 2023-10-15 MED ORDER — DONEPEZIL HCL 5 MG PO TABS: 5.0000 mg | ORAL_TABLET | Freq: Every day | ORAL | 5 refills | Status: DC

## 2023-10-15 NOTE — Telephone Encounter (Signed)
Call to daughter who reports worsening memory and difficulty walking. She is requiring more assistance with walking and got lost for 3 hours the other day. She reports being winded after walking and taking longer to walker distances. She has been in the bed for about weeks saying that she is just tired. I advised her to reach out to PCP for those symptoms but I would follow up with Dr. Epimenio Foot on memory and gait issues. She is in remission for lung and kidney cancer and last scan shows no metastases per daughter. Daughter also wants to know if her mother has lewy body dementia as it was discussed at last visit. Daughter appreciative of call.

## 2023-10-15 NOTE — Telephone Encounter (Signed)
Call to daughter carole, she verbalized understanding of Dr. Bonnita Hollow recommendations. She is in agreement to try the donepezil and reach out to PCP for fatigue. Daughter also states that she has been sleep deprived. I advised to not over stimulate to induce tiredness because that can contribute to increased agitation and fatigue. Daughter verbalized understanding.

## 2023-10-31 ENCOUNTER — Telehealth: Payer: Self-pay | Admitting: Neurology

## 2023-10-31 ENCOUNTER — Other Ambulatory Visit: Payer: Self-pay | Admitting: Nephrology

## 2023-10-31 DIAGNOSIS — N184 Chronic kidney disease, stage 4 (severe): Secondary | ICD-10-CM

## 2023-10-31 NOTE — Telephone Encounter (Signed)
 I called patient's daughter, Okey Regal, per DPR, to discuss further.  No answer, left a voicemail asking her to call us back.  Please route to POD 1 when patient's daughter calls back.

## 2023-10-31 NOTE — Telephone Encounter (Signed)
 Pt's daughter states pt believes donepezil  (ARICEPT ) 5 MG tablet is causing her to stay awake vs sleeping, leg pain, and spouts of memory loss. States she's personally not sure if the new medication is the cause of new symptoms but would like to speak with someone to discuss. Requesting call back

## 2023-11-01 ENCOUNTER — Ambulatory Visit
Admission: RE | Admit: 2023-11-01 | Discharge: 2023-11-01 | Disposition: A | Discharge status: Home / Self Care | Payer: No Typology Code available for payment source | Source: Ambulatory Visit | Attending: Nephrology | Admitting: Nephrology

## 2023-11-01 DIAGNOSIS — N184 Chronic kidney disease, stage 4 (severe): Secondary | ICD-10-CM

## 2023-11-02 NOTE — Telephone Encounter (Signed)
[name], please advise. Thanks.

## 2023-11-05 NOTE — Telephone Encounter (Signed)
 I had recommended she contact her heart doctor because she was having trouble with fluid retention/overload which is contributing to this sensation. Her BNP was elevated and she has diastolic heart failure. She also has significant kidney disease, which contributes to the fluid retention. She needs to schedule an appt with them ASAP.

## 2023-11-06 NOTE — Telephone Encounter (Signed)
 Pt's daughter returning call. Requesting call back

## 2023-11-06 NOTE — Telephone Encounter (Signed)
 Called daughter/Carol. Her mother feels donepezil  caused SE. Slept two nights/only 2 hr. Causing leg pain. Stopped taking donepezil . She took medication for about 2 wk. Leg pain improved after stopping but insomnia ongoing. Daughter trying to get POA.   Daughter states mother now has stage 4 kidney disease. They are going to do iron infusion, pending scheduling. This should help fatigue. Unsteady while walking. This is not new. Present at visit with Dr. Vear.   She took ambien  for years and eventually was ineffective. Would not want this. Asking if there is another medication that be tried. Thinks she may have also tried Lunesta in the past. Not on any current sleep meds. Takes morphine  daily for chronic pain.  Aware I will send to MD for review and call back with his recommendations. She states if he wants her to come in for appt, she is agreeable to bring her.

## 2023-11-07 MED ORDER — DOXEPIN HCL 10 MG PO CAPS: 10.0000 mg | ORAL_CAPSULE | Freq: Every day | ORAL | 5 refills | Status: DC

## 2023-11-07 NOTE — Telephone Encounter (Signed)
 Called daughter back. Relayed Dr. Thom Fleeting recommendation. She is agreeable, rx called into pharmacy. However, she is still concerned about ongoing balance/walking issues and worsening memory. Wondering if she should come in to see MD. I placed on hold and spoke w/ Dr. Godwin Lat. He agreed to see her for further evaluation. Scheduled work in for 11/08/23 at 1:30pm. Asked they check in at 1:00pm.

## 2023-11-07 NOTE — Addendum Note (Signed)
 Addended by: Beauford Bounds L on: 11/07/2023 09:30 AM   Modules accepted: Orders

## 2023-11-08 ENCOUNTER — Encounter: Payer: Self-pay | Admitting: Neurology

## 2023-11-08 ENCOUNTER — Ambulatory Visit (INDEPENDENT_AMBULATORY_CARE_PROVIDER_SITE_OTHER): Payer: No Typology Code available for payment source | Admitting: Neurology

## 2023-11-08 ENCOUNTER — Telehealth: Payer: Self-pay | Admitting: Neurology

## 2023-11-08 VITALS — BP 164/83 | HR 87 | Ht 66.0 in | Wt 156.0 lb

## 2023-11-08 DIAGNOSIS — R413 Other amnesia: Secondary | ICD-10-CM | POA: Diagnosis not present

## 2023-11-08 DIAGNOSIS — G629 Polyneuropathy, unspecified: Secondary | ICD-10-CM

## 2023-11-08 DIAGNOSIS — N184 Chronic kidney disease, stage 4 (severe): Secondary | ICD-10-CM | POA: Diagnosis not present

## 2023-11-08 DIAGNOSIS — R270 Ataxia, unspecified: Secondary | ICD-10-CM

## 2023-11-08 DIAGNOSIS — R269 Unspecified abnormalities of gait and mobility: Secondary | ICD-10-CM | POA: Diagnosis not present

## 2023-11-08 MED ORDER — GALANTAMINE HYDROBROMIDE 4 MG PO TABS
ORAL_TABLET | ORAL | 5 refills | Status: DC
  Filled 2024-01-03: qty 60, 30d supply, fill #0

## 2023-11-08 NOTE — Telephone Encounter (Signed)
Pt's daughter called to ask if [facility] accept [facility]. Informed her to we do accept that insurance

## 2023-11-08 NOTE — Progress Notes (Signed)
[facility]  PATIENT: [name] DOB: [birth year]  REFERRING DOCTOR OR PCP: Tracey Harries, MD SOURCE: Patient, notes from primary care imaging and lab results, MRI images personally reviewed.  CT scan images reviewed  _________________________________   HISTORICAL  CHIEF COMPLAINT:  Chief Complaint  Patient presents with   Room 11    Pt is here with her Daughter. Pt's daughter states that pt's gait is off. Pt's daughter states that pt hasn't been sleeping well. Pt's daughter states that pt's memory has gotten worse since last appointment. Pt's daughter states that pt couldn't tolerate Donepezil well, her legs hurt while on it.      HISTORY OF PRESENT ILLNESS:  [name] is a 73 y.o. woman with memory decline and gait difficulties.   UPDATE 11/08/2023 Since the last visit, she had the ATN profile -- amyloid ratios were normal though [id number] was elevated.  Additionally, NfL was elevated.  Additionally she also had an MRI of the brain showing Few scattered T2/FLAIR hyperintense foci in the hemispheres consistent with mild chronic microvascular ischemic change that would be normal for age.  Brain volume was mildly reduced but could be normal for age.  She feels her memory is poor.  Her daughter feels she has worsened since last visit 2-3 months ago.   She did not tolerate donepezil as it made her legs hurt.    She spends most of the day at home with her dogs and goes shopping once or twice a week (drives but very close to home) .   The daughter reports that she has done online shopping and then does not remember buying the item .      She continues to have a poor gait.  She feels that she is the same as she was around the time of the last visit though the daughter feels she is doing worse.  She denies any recent falls.  MRI of the cervical spine does show moderate spinal stenosis but there is no cord compression and no abnormal signal within the spinal cord.  She does  not note any change in her bladder.  MRI brain 08/31/2023 showed Mild generalized cortical atrophy, slightly more than typical for age and progressed compared to the 2011 MRI.    Mild chronic microvascular ischemic change   She has CRF (BUN is 52 and crt is 2.5 last month).  Of note, the renal function has worsened over the last year with BUN going from 35-52 and creatinine going from 1.5-2.5 (GFR 35 to 20).  She is going to get an iron infusion.    She has a f/u with her nephrologist soon.   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   At C3-C4, there is mild anterolisthesis and other degenerative change causing mild to moderate spinal stenosis and moderate right greater than left foraminal narrowing.  There is no definite nerve root compression, the degenerative changes encroach upon the C4 nerve roots. At C5-C6, there is trace retrolisthesis and other degenerative change causing moderate spinal stenosis and moderate right greater than left foraminal narrowing.  There is no definite nerve root compression though the degenerative changes encroach upon the C6 nerve roots. At C6-C7, there are degenerative changes causing moderate spinal stenosis and moderate left greater than right foraminal narrowing.  There is no nerve root compression. Milder degenerative changes at the other cervical levels as detailed above not leading to spinal  stenosis or nerve root compression.   Memory History: She is a 73 year old woman who has had progressive cognitive changes over the last couple years.   Her daughter notes that [name] has more memory issues and confusion than she does.   Dannah feels the memory issues are just minimal and due to age   Her daughter notes she has missed several appointments due to not remembering.  She has poor time management.   She has trouble following recipes and doing financial management,     I reviewed a CT scan from  04/09/2021 and it showed mild atrophy.      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   She has been told she had mild CHF in past.   No arrhythmias.     Due to chroni pain she is on morphine sulfate 30 mg bid.  Also on clonazepam, Wellbutrin, Lexapro      11/08/2023    1:34 PM 07/25/2023    2:21 PM  Montreal Cognitive Assessment   Visuospatial/ Executive (0/5) 4 4  Naming (0/3) 3 3  Attention: Read list of digits (0/2) 2 2  Attention: Read list of letters (0/1) 1 1  Attention: Serial 7 subtraction starting at 100 (0/3) 0 3  Language: Repeat phrase (0/2) 2 2  Language : Fluency (0/1) 1 1  Abstraction (0/2) 2 2  Delayed Recall (0/5) 0 0  Orientation (0/6) 4 6  Total 19 24  Adjusted Score (based on education) 20 25    15  minutes later got 2/5 spontaneously and 3 with category prompt   IMAGING MRI of the lumbar spine 07/12/2023 shows fusion from L3-S1.  The L3-L4 interspace is hard to evaluate though there could be spinal stenosis.  There does appear to be moderate spinal stenosis at L2-L3 above the fusion.  MRI of the thoracic spine 04/08/2023 shows multilevel disc protrusions.  There is minimal spinal stenosis and various degrees of foraminal narrowing at T10-L1 due to disc and facet changes..    CT scan of the head 04/11/2021 showed mild atrophy.  Minimal chronic microvascular ischemic changes.  No acute findings.  REVIEW OF SYSTEMS: Constitutional: No fevers, chills, sweats, or change in appetite Eyes: No visual changes, double vision, eye pain Ear, nose and throat: No hearing loss, ear pain, nasal congestion, sore throat Cardiovascular: No chest pain, palpitations Respiratory:  No shortness of breath at rest or with exertion.   No wheezes GastrointestinaI: No nausea, vomiting, diarrhea, abdominal pain, fecal incontinence Genitourinary:  No dysuria, urinary retention or frequency.  No nocturia. Musculoskeletal: Chronic spine and joint pain.   Multiple joint operations.   Integumentary: No rash, pruritus, skin lesions Neurological: as above Psychiatric: Has depression and anxiety.  Endocrine: No palpitations, diaphoresis, change in appetite, change in weigh or increased thirst Hematologic/Lymphatic:  No anemia, purpura, petechiae. Allergic/Immunologic: No itchy/runny eyes, nasal congestion, recent allergic reactions, rashes  ALLERGIES: Allergies  Allergen Reactions   Amoxicillin Other (See Comments)    UNSPECIFIED REACTION  Has patient had a PCN reaction causing immediate rash, facial/tongue/throat swelling, SOB or lightheadedness with hypotension: No Has patient had a PCN reaction causing severe rash involving mucus membranes or skin necrosis: No Has patient had a PCN reaction that required hospitalization No Has patient had a PCN reaction occurring within the last 10 years: No If all of the above answers are "NO", then may proceed with Cephalosporin use.   Donepezil  Leg pain   Ampicillin Rash and Other (See Comments)    Has patient had a PCN reaction causing immediate rash, facial/tongue/throat swelling, SOB or lightheadedness with hypotension: No Has patient had a PCN reaction causing severe rash involving mucus membranes or skin necrosis: No Has patient had a PCN reaction that required hospitalization No Has patient had a PCN reaction occurring within the last 10 years: No If all of the above answers are "NO", then may proceed with Cephalosporin use.    HOME MEDICATIONS:   PAST MEDICAL HISTORY: Past Medical History:  Diagnosis Date   Anemia    Anxiety    Arthritis    "everywhere" (08/19/2013)   Bilateral carpal tunnel syndrome    Chest pain at rest, rule out pericarditis 11/27/2012   CHF (congestive heart failure) (HCC)    Chronic kidney disease    dr Isabel Caprice   Chronic lower back pain    Closed displaced fracture of right femoral neck (HCC) 02/18/2019   Complication of anesthesia    "woke up twice during  knee replacement" (08/19/2013)   Depression    Dyspnea    w/ exertion     Fibromyalgia    Headache(784.0)    hx of migraines    History of pericarditis, with pericardial window in 2009 11/27/2012   Hx of ovarian cancer 11/27/2012   Hypothyroidism 11/27/2012   Iron deficiency anemia    Kidney carcinoma (HCC)    "both kidneys; ~ 3 yr apart" (08/19/2013)   Lung cancer (HCC) 2018   Melanoma of back (HCC)    Migraines    Pneumonia    "several times; including today", RUL/notes 08/19/2013   Squamous carcinoma    "face, side of my nose, chest; used acid to get rid of them" (08/19/2013)   Venous insufficiency 11/27/2012    PAST SURGICAL HISTORY: Past Surgical History:  Procedure Laterality Date   ABDOMINAL HYSTERECTOMY  1979   ANTERIOR LAT LUMBAR FUSION Left 07/16/2018   Procedure: Left Lumbar two-three  Anterolateral decompression/interbody fusion with lateral plate fixation;  Surgeon: Barnett Abu, MD;  Location: [facility] OR;  Service: Neurosurgery;  Laterality: Left;   APPENDECTOMY     COLON RESECTION  07/2019   DOPPLER ECHOCARDIOGRAPHY  02/23/2011   LVEF>50%   FRACTURE SURGERY     KNEE ARTHROSCOPY Right    "twice before the replacement" (08/19/2013)   LEFT OOPHORECTOMY Left 1979   LUMBAR DISC SURGERY  1984   "ruptured disc"   MELANOMA EXCISION  ~ 2010   "my lower back" (08/19/2013)   ORIF HIP FRACTURE Left    x 2 age 10 and 4   PARTIAL NEPHRECTOMY Right 2005; ~ 2008   PERICARDIAL WINDOW  ~ 2012   POSTERIOR LUMBAR FUSION  2002?; 03/2012   REDUCTION MAMMAPLASTY Bilateral ~ 2008   RIGHT OOPHORECTOMY  ~ 2007   "it had cancer in it" (08/19/2013)   SHOULDER ARTHROSCOPY W/ ROTATOR CUFF REPAIR Right 1990's   TONSILLECTOMY  1954   TOTAL HIP ARTHROPLASTY Right 02/18/2019   Procedure: TOTAL HIP ARTHROPLASTY;  Surgeon: Teryl Lucy, MD;  Location: [facility] OR;  Service: Orthopedics;  Laterality: Right;   TOTAL HIP REVISION Right 03/27/2019   Procedure: TOTAL HIP REVISION;  Surgeon: Durene Romans, MD;   Location: [facility];  Service: Orthopedics;  Laterality: Right;   TOTAL HIP REVISION Right 08/12/2020   Procedure: RIGHT TOTAL HIP REVISION TO CONSTRAINED LINER;  Surgeon: Durene Romans, MD;  Location: [facility];  Service: Orthopedics;  Laterality: Right;  90 mins   TOTAL KNEE ARTHROPLASTY Right 1990's   TOTAL KNEE ARTHROPLASTY Left 08/02/2015   Procedure: LEFT TOTAL KNEE ARTHROPLASTY;  Surgeon: Durene Romans, MD;  Location: [facility];  Service: Orthopedics;  Laterality: Left;   TOTAL SHOULDER REPLACEMENT Left 2006?   TUBAL LIGATION  1952   VIDEO ASSISTED THORACOSCOPY (VATS)/WEDGE RESECTION Right 09/28/2017   Procedure: RIGHT VIDEO ASSISTED THORACOSCOPY (VATS)/WEDGE RESECTION, LYMPH NODE DISSECTION ;  Surgeon: Loreli Slot, MD;  Location: [facility] OR;  Service: Thoracic;  Laterality: Right;    FAMILY HISTORY: Family History  Problem Relation Age of Onset   Cancer Mother 36       Stomach   Cancer Father 63       Lung cancer   Bipolar disorder Sister     SOCIAL HISTORY: Social History   Socioeconomic History   Marital status: Married    Spouse name: Not on file   Number of children: 2   Years of education: Not on file   Highest education level: Not on file  Occupational History   Not on file  Tobacco Use   Smoking status: Former    Current packs/day: 0.00    Average packs/day: 1.5 packs/day for 18.0 years (27.0 ttl pk-yrs)    Types: Cigarettes    Start date: 09/23/1967    Quit date: 09/22/1985    Years since quitting: 38.1   Smokeless tobacco: Never  Vaping Use   Vaping status: Never Used  Substance and Sexual Activity   Alcohol use: No   Drug use: No    Comment: prescribed   Sexual activity: Not Currently  Other Topics Concern   Not on file  Social History Narrative   Married.  Lives with husband.  Two children one boy and one girl.  Three granddaughters and two great granddaughters.    Social Drivers of Corporate investment banker Strain: Low Risk  (08/31/2023)    [facility]   Overall Financial Resource Strain (CARDIA)    Difficulty of Paying Living Expenses: Not hard at all  Food Insecurity: No Food Insecurity (08/31/2023)   Received from [facility]   Hunger Vital Sign    Worried About Running Out of Food in the Last Year: Never true    Ran Out of Food in the Last Year: Never true  Transportation Needs: No Transportation Needs (08/31/2023)   Received from [facility] - Transportation    Lack of Transportation (Medical): No    Lack of Transportation (Non-Medical): No  Physical Activity: Insufficiently Active (08/14/2022)   Received [facility]   Exercise Vital Sign    Days of Exercise per Week: 7 days    Minutes of Exercise per Session: 10 min  Stress: Stress Concern Present (08/14/2022)   Received from [facility] - Occupational Stress Questionnaire    Feeling of Stress : To some extent  Social Connections: Unknown (06/22/2023)   Received from [facility]   Social Network    Social Network: Not on file  Intimate Partner Violence: Unknown (06/22/2023)   Received from [facility]   HITS    Physically Hurt: Not on file    Insult or Talk Down To: Not on file    Threaten Physical Harm: Not on file    Scream or Curse: Not on file       PHYSICAL EXAM  Vitals:   11/08/23 1330  BP: Marland Kitchen)  164/83  Pulse: 87  Weight: 156 lb (70.8 kg)  Height: 5\' 6"  (1.676 m)    Body mass index is 25.18 kg/m.   General: The patient is well-developed and well-nourished and in no acute distress  HEENT:  Head is /AT.  Sclera are anicteric.   Skin: Extremities are without rash or  edema.  Musculoskeletal:  Back is nontender  Neurologic Exam  Mental status: The patient is alert and oriented x 3 at the time of the examination.  She had reduced short-term memory.  Executive function was mildly reduced.  Speech was normal.  .  Cranial  nerves: Extraocular movements are full.  Facial strength and sensation was normal.. No dysarthria is noted.   No obvious hearing deficits are noted.  Motor:  Muscle bulk is normal.   Tone is normal. Strength is  5 / 5 in all 4 extremities.   Sensory: Sensory testing is intact to pinprick, soft touch and vibration sensation in the arms.  She has reduced vibration sensation at the toes (25%) and ankles (50%) but normal sensation at the knees.  Pinprick sensation was fine.  Coordination: Cerebellar testing reveals good finger-nose-finger and heel-to-shin bilaterally.  Gait and station: She uses her arms to get up out of the chair.  The gait has a mildly reduced stride..  She takes 5 steps to turn 180 degrees.  She has retropulsion.  Exam essentially unchanged compared to October 2024.  Romberg is negative.   Reflexes: Deep tendon reflexes are symmetric and normal bilaterally.   Plantar responses are flexor.    DIAGNOSTIC DATA (LABS, IMAGING, TESTING) - I reviewed patient records, labs, notes, testing and imaging myself where available.  Lab Results  Component Value Date   WBC 8.1 10/11/2023   HGB 9.5 (L) 10/11/2023   HCT 29.8 (L) 10/11/2023   MCV 86.4 10/11/2023   PLT 182 10/11/2023      Component Value Date/Time   NA 136 09/19/2023 1320   NA 144 03/13/2018 1427   K 4.2 09/19/2023 1320   CL 100 09/19/2023 1320   CO2 29 09/19/2023 1320   GLUCOSE 84 09/19/2023 1320   BUN 52 (H) 09/19/2023 1320   BUN 42 (H) 03/13/2018 1427   CREATININE 2.50 (H) 09/19/2023 1320   CREATININE 1.33 (H) 09/02/2015 1220   CALCIUM 10.5 (H) 09/19/2023 1320   PROT 6.9 09/19/2023 1320   PROT 6.4 07/25/2023 1529   ALBUMIN 4.0 09/19/2023 1320   ALBUMIN 4.4 03/13/2018 1427   AST 23 09/19/2023 1320   ALT 14 09/19/2023 1320   ALKPHOS 71 09/19/2023 1320   BILITOT 0.4 09/19/2023 1320   GFRNONAA 20 (L) 09/19/2023 1320   GFRNONAA 42 (L) 09/02/2015 1220   GFRAA 49 (L) 03/09/2020 2027   GFRAA 55 (L) 11/13/2019  1024   GFRAA 48 (L) 09/02/2015 1220   Lab Results  Component Value Date   CHOL 205 (H) 11/15/2017   HDL 71 11/15/2017   LDLCALC 117 (H) 11/15/2017   TRIG 85 11/15/2017   CHOLHDL 2.9 11/15/2017   Lab Results  Component Value Date   HGBA1C 5.5 12/04/2018   Lab Results  Component Value Date   VITAMINB12 520 07/25/2023   Lab Results  Component Value Date   TSH 1.67 08/20/2023       ASSESSMENT AND PLAN  Memory loss  Gait disturbance  Polyneuropathy  Ataxia  Chronic renal failure, stage 4 (severe) (HCC)  Etiology of memory disturbance is unclear.  The blood work was not  typical for Alzheimer's disease and she does not have medial temporal atrophy out of proportion to other areas.  Therefore, Alzheimer's is not likely.  I am concerned that she has had worsening renal function and her BUN is now above 50.  It is possible that some of the worsening in the last couple months could be due to to this issue.  Additionally, she is on very high dose morphine which might also be playing some role she.  She did not tolerate donepezil so I will have her try galantamine.  Because of her renal failure we will have her start at the lowest dose Gait issues are likely multifactorial.  Although she does have moderate spinal stenosis, she does not appear to have evidence of spinal cord compression. Return in 6 months or sooner if there are new or worsening neurologic symptoms.  42-minute office visit with the majority of the time spent face-to-face for history and physical, discussion/counseling and decision-making.  Additional time with record review and documentation.  This visit is part of a comprehensive longitudinal care medical relationship regarding the patients primary diagnosis of dementia and related concerns.  Judythe Postema A. Epimenio Foot, MD, [facility] 11/08/2023, 8:24 PM Certified in Neurology, Clinical Neurophysiology, Sleep Medicine and Neuroimaging  [name] [address], [address] [place], [place] [address] [phone]

## 2023-11-12 ENCOUNTER — Other Ambulatory Visit: Payer: Medicare HMO

## 2023-11-12 ENCOUNTER — Ambulatory Visit: Payer: Medicare HMO | Admitting: Internal Medicine

## 2023-11-14 ENCOUNTER — Ambulatory Visit: Payer: Medicare HMO | Admitting: Nurse Practitioner

## 2023-12-06 ENCOUNTER — Other Ambulatory Visit (HOSPITAL_COMMUNITY): Payer: Self-pay

## 2023-12-06 MED ORDER — MORPHINE SULFATE 30 MG PO TABS
30.0000 mg | ORAL_TABLET | Freq: Two times a day (BID) | ORAL | 0 refills | Status: DC
  Filled 2023-12-06: qty 14, 7d supply, fill #0

## 2023-12-07 ENCOUNTER — Other Ambulatory Visit: Payer: Self-pay

## 2023-12-07 DIAGNOSIS — D631 Anemia in chronic kidney disease: Secondary | ICD-10-CM | POA: Insufficient documentation

## 2023-12-10 ENCOUNTER — Encounter: Payer: Self-pay | Admitting: Nephrology

## 2023-12-10 DIAGNOSIS — D509 Iron deficiency anemia, unspecified: Secondary | ICD-10-CM | POA: Insufficient documentation

## 2023-12-11 ENCOUNTER — Other Ambulatory Visit (HOSPITAL_COMMUNITY): Payer: Self-pay

## 2023-12-11 ENCOUNTER — Encounter: Payer: Self-pay | Admitting: Nephrology

## 2023-12-11 MED ORDER — MORPHINE SULFATE 30 MG PO TABS
30.0000 mg | ORAL_TABLET | Freq: Two times a day (BID) | ORAL | 0 refills | Status: DC | PRN
  Filled 2023-12-14: qty 60, 30d supply, fill #0

## 2023-12-14 ENCOUNTER — Telehealth: Payer: Self-pay | Admitting: Pharmacy Technician

## 2023-12-14 ENCOUNTER — Other Ambulatory Visit (HOSPITAL_COMMUNITY): Payer: Self-pay

## 2023-12-14 NOTE — Telephone Encounter (Signed)
Dr. Allena Katz, [facility] note:  Patient will be scheduled as soon as possible.  Auth Submission: NO AUTH NEEDED Site of care: Site of care: [facility] Payer: [employer] MEDICARE Medication & CPT/J Code(s) submitted: Feraheme (ferumoxytol) [id number] Route of submission (phone, fax, portal):  Phone # Fax # Auth type: Buy/Bill PB Units/visits requested: 2 DOSES Reference number:  Approval from: 12/14/23 to 05/12/24

## 2023-12-25 ENCOUNTER — Ambulatory Visit: Payer: Medicare Other

## 2023-12-25 VITALS — BP 130/69 | HR 76 | Temp 98.5°F | Resp 16 | Ht 65.0 in

## 2023-12-25 DIAGNOSIS — N186 End stage renal disease: Secondary | ICD-10-CM | POA: Diagnosis not present

## 2023-12-25 DIAGNOSIS — D509 Iron deficiency anemia, unspecified: Secondary | ICD-10-CM

## 2023-12-25 DIAGNOSIS — D631 Anemia in chronic kidney disease: Secondary | ICD-10-CM

## 2023-12-25 MED ORDER — ACETAMINOPHEN 325 MG PO TABS: 650.0000 mg | ORAL_TABLET | Freq: Once | ORAL | Status: DC

## 2023-12-25 MED ORDER — DIPHENHYDRAMINE HCL 25 MG PO CAPS: 25.0000 mg | ORAL_CAPSULE | Freq: Once | ORAL | Status: DC

## 2023-12-25 MED ORDER — SODIUM CHLORIDE 0.9 % IV SOLN
510.0000 mg | Freq: Once | INTRAVENOUS | Status: AC
  Administered 2023-12-25: 510 mg via INTRAVENOUS
  Filled 2023-12-25: qty 17

## 2023-12-25 NOTE — Progress Notes (Signed)
 Diagnosis: Iron Deficiency Anemia  Provider:  Chilton Greathouse MD  Procedure: IV Infusion  IV Type: Peripheral, IV Location: L Antecubital  Feraheme (Ferumoxytol), Dose: 510 mg  Infusion Start Time: 1442  Infusion Stop Time: 1457  Patient refused pre-medications. Nurse educated patient and stressed the importance of taking pre-medications as a precaution in the event of a medication reaction. Patient verbalized understanding.   Post Infusion IV Care: Observation period completed and Peripheral IV Discontinued  Discharge: Condition: Good, Destination: Home . AVS Provided   Pt stayed 23 minutes of 30, stated needed to leave to go eat, she has another appt after this visit and would like time to eat prior to that appt  Performed by:  Loney Hering, LPN

## 2023-12-31 ENCOUNTER — Other Ambulatory Visit (HOSPITAL_COMMUNITY): Payer: Self-pay

## 2023-12-31 MED ORDER — DONEPEZIL HCL 5 MG PO TABS
5.0000 mg | ORAL_TABLET | Freq: Every day | ORAL | 3 refills | Status: DC
  Filled 2023-12-31 – 2024-01-03 (×3): qty 90, 90d supply, fill #0

## 2024-01-01 ENCOUNTER — Other Ambulatory Visit (HOSPITAL_COMMUNITY): Payer: Self-pay

## 2024-01-01 ENCOUNTER — Ambulatory Visit: Payer: Medicare Other

## 2024-01-01 VITALS — BP 154/73 | HR 77 | Temp 97.4°F | Resp 18 | Ht 65.0 in | Wt 144.8 lb

## 2024-01-01 DIAGNOSIS — N186 End stage renal disease: Secondary | ICD-10-CM

## 2024-01-01 DIAGNOSIS — D631 Anemia in chronic kidney disease: Secondary | ICD-10-CM

## 2024-01-01 DIAGNOSIS — D509 Iron deficiency anemia, unspecified: Secondary | ICD-10-CM

## 2024-01-01 MED ORDER — DIPHENHYDRAMINE HCL 25 MG PO CAPS: 25.0000 mg | ORAL_CAPSULE | Freq: Once | ORAL | Status: DC

## 2024-01-01 MED ORDER — ACETAMINOPHEN 325 MG PO TABS: 650.0000 mg | ORAL_TABLET | Freq: Once | ORAL | Status: DC

## 2024-01-01 MED ORDER — ESCITALOPRAM OXALATE 10 MG PO TABS
10.0000 mg | ORAL_TABLET | Freq: Every day | ORAL | 3 refills | Status: AC
  Filled 2024-01-01: qty 90, 90d supply, fill #0
  Filled 2024-03-11: qty 90, 90d supply, fill #1
  Filled 2024-06-21: qty 90, 90d supply, fill #2
  Filled 2024-09-09: qty 90, 90d supply, fill #3

## 2024-01-01 MED ORDER — FERUMOXYTOL INJECTION 510 MG/17 ML
510.0000 mg | Freq: Once | INTRAVENOUS | Status: AC
  Administered 2024-01-01: 510 mg via INTRAVENOUS
  Filled 2024-01-01: qty 17

## 2024-01-01 MED ORDER — GALANTAMINE HYDROBROMIDE 4 MG PO TABS
4.0000 mg | ORAL_TABLET | Freq: Two times a day (BID) | ORAL | 3 refills | Status: DC
  Filled 2024-01-01: qty 60, 30d supply, fill #0

## 2024-01-01 NOTE — Progress Notes (Signed)
 Diagnosis: Iron Deficiency Anemia  Provider:  Chilton Greathouse MD  Procedure: IV Infusion  IV Type: Peripheral, IV Location: L Antecubital  Feraheme (Ferumoxytol), Dose: 510 mg  Infusion Start Time: 1535  Infusion Stop Time: 1552  Post Infusion IV Care: Patient declined observation and Peripheral IV Discontinued  Discharge: Condition: Good, Destination: Home . AVS Declined  Performed by:  Adriana Mccallum, RN    Patient refused pre-medications. Nurse educated patient and stressed the importance of taking pre-medications as a precaution in the event of a medication reaction. Patient verbalized understanding.

## 2024-01-01 NOTE — Patient Instructions (Signed)

## 2024-01-02 ENCOUNTER — Other Ambulatory Visit (HOSPITAL_COMMUNITY): Payer: Self-pay

## 2024-01-03 ENCOUNTER — Other Ambulatory Visit (HOSPITAL_COMMUNITY): Payer: Self-pay

## 2024-01-04 ENCOUNTER — Other Ambulatory Visit (HOSPITAL_COMMUNITY): Payer: Self-pay

## 2024-01-14 ENCOUNTER — Other Ambulatory Visit (HOSPITAL_COMMUNITY): Payer: Self-pay

## 2024-01-14 MED ORDER — MORPHINE SULFATE 30 MG PO TABS
30.0000 mg | ORAL_TABLET | Freq: Two times a day (BID) | ORAL | 0 refills | Status: DC | PRN
  Filled 2024-01-14: qty 6, 3d supply, fill #0

## 2024-01-17 ENCOUNTER — Other Ambulatory Visit (HOSPITAL_COMMUNITY): Payer: Self-pay

## 2024-01-17 MED ORDER — CYCLOBENZAPRINE HCL 10 MG PO TABS
10.0000 mg | ORAL_TABLET | Freq: Every day | ORAL | 1 refills | Status: DC | PRN
  Filled 2024-01-17: qty 30, 30d supply, fill #0
  Filled 2024-04-11: qty 30, 30d supply, fill #1

## 2024-01-17 MED ORDER — MORPHINE SULFATE 30 MG PO TABS
30.0000 mg | ORAL_TABLET | ORAL | 0 refills | Status: DC
  Filled 2024-01-17: qty 60, 30d supply, fill #0

## 2024-01-24 ENCOUNTER — Other Ambulatory Visit (HOSPITAL_COMMUNITY): Payer: Self-pay

## 2024-01-24 MED ORDER — FUROSEMIDE 20 MG PO TABS
20.0000 mg | ORAL_TABLET | Freq: Every day | ORAL | 6 refills | Status: AC
  Filled 2024-01-24: qty 30, 30d supply, fill #0
  Filled 2024-03-05: qty 30, 30d supply, fill #1

## 2024-01-31 ENCOUNTER — Other Ambulatory Visit (HOSPITAL_COMMUNITY): Payer: Self-pay

## 2024-01-31 ENCOUNTER — Ambulatory Visit: Payer: No Typology Code available for payment source | Admitting: Nurse Practitioner

## 2024-01-31 DIAGNOSIS — D509 Iron deficiency anemia, unspecified: Secondary | ICD-10-CM

## 2024-01-31 MED ORDER — GALANTAMINE HYDROBROMIDE 8 MG PO TABS
ORAL_TABLET | ORAL | 3 refills | Status: AC
  Filled 2024-01-31: qty 60, 30d supply, fill #0
  Filled 2024-03-01: qty 60, 30d supply, fill #1
  Filled 2024-03-27: qty 60, 30d supply, fill #2
  Filled 2024-04-26: qty 60, 30d supply, fill #3
  Filled 2024-05-26: qty 60, 30d supply, fill #4
  Filled 2024-06-19: qty 60, 30d supply, fill #5
  Filled 2024-07-24: qty 60, 30d supply, fill #6
  Filled 2024-08-18: qty 60, 30d supply, fill #7
  Filled 2024-09-09 – 2024-09-16 (×2): qty 60, 30d supply, fill #8
  Filled 2024-10-16: qty 60, 30d supply, fill #9
  Filled 2024-11-17: qty 60, 30d supply, fill #10

## 2024-02-01 ENCOUNTER — Other Ambulatory Visit (HOSPITAL_COMMUNITY): Payer: Self-pay

## 2024-02-13 ENCOUNTER — Other Ambulatory Visit (HOSPITAL_COMMUNITY): Payer: Self-pay

## 2024-02-13 MED ORDER — MORPHINE SULFATE 30 MG PO TABS
30.0000 mg | ORAL_TABLET | Freq: Two times a day (BID) | ORAL | 0 refills | Status: DC | PRN
  Filled 2024-02-13 – 2024-02-15 (×2): qty 60, 30d supply, fill #0

## 2024-02-14 ENCOUNTER — Other Ambulatory Visit (HOSPITAL_BASED_OUTPATIENT_CLINIC_OR_DEPARTMENT_OTHER): Payer: Self-pay

## 2024-02-14 ENCOUNTER — Other Ambulatory Visit (HOSPITAL_COMMUNITY): Payer: Self-pay

## 2024-02-15 ENCOUNTER — Other Ambulatory Visit (HOSPITAL_COMMUNITY): Payer: Self-pay

## 2024-03-01 ENCOUNTER — Other Ambulatory Visit (HOSPITAL_COMMUNITY): Payer: Self-pay

## 2024-03-03 ENCOUNTER — Other Ambulatory Visit (HOSPITAL_COMMUNITY): Payer: Self-pay

## 2024-03-04 ENCOUNTER — Other Ambulatory Visit (HOSPITAL_COMMUNITY): Payer: Self-pay

## 2024-03-11 ENCOUNTER — Other Ambulatory Visit (HOSPITAL_COMMUNITY): Payer: Self-pay

## 2024-03-12 ENCOUNTER — Other Ambulatory Visit (HOSPITAL_COMMUNITY): Payer: Self-pay

## 2024-03-12 MED ORDER — MORPHINE SULFATE 30 MG PO TABS
30.0000 mg | ORAL_TABLET | Freq: Two times a day (BID) | ORAL | 0 refills | Status: DC | PRN
  Filled 2024-03-12 – 2024-03-14 (×2): qty 60, 30d supply, fill #0

## 2024-03-14 ENCOUNTER — Other Ambulatory Visit (HOSPITAL_COMMUNITY): Payer: Self-pay

## 2024-03-24 ENCOUNTER — Other Ambulatory Visit (HOSPITAL_COMMUNITY): Payer: Self-pay

## 2024-03-26 ENCOUNTER — Encounter (HOSPITAL_BASED_OUTPATIENT_CLINIC_OR_DEPARTMENT_OTHER)

## 2024-03-26 ENCOUNTER — Ambulatory Visit: Admitting: Nurse Practitioner

## 2024-03-27 ENCOUNTER — Other Ambulatory Visit (HOSPITAL_COMMUNITY): Payer: Self-pay

## 2024-03-28 ENCOUNTER — Other Ambulatory Visit (HOSPITAL_COMMUNITY): Payer: Self-pay

## 2024-03-28 ENCOUNTER — Other Ambulatory Visit (HOSPITAL_BASED_OUTPATIENT_CLINIC_OR_DEPARTMENT_OTHER): Payer: Self-pay

## 2024-03-31 ENCOUNTER — Other Ambulatory Visit (HOSPITAL_COMMUNITY): Payer: Self-pay

## 2024-04-07 ENCOUNTER — Other Ambulatory Visit: Payer: Self-pay | Admitting: Neurological Surgery

## 2024-04-07 DIAGNOSIS — M48062 Spinal stenosis, lumbar region with neurogenic claudication: Secondary | ICD-10-CM

## 2024-04-10 ENCOUNTER — Other Ambulatory Visit (HOSPITAL_COMMUNITY): Payer: Self-pay

## 2024-04-10 MED ORDER — MORPHINE SULFATE 30 MG PO TABS
30.0000 mg | ORAL_TABLET | Freq: Two times a day (BID) | ORAL | 0 refills | Status: DC | PRN
  Filled 2024-04-10: qty 60, 30d supply, fill #0
  Filled ????-??-??: fill #0

## 2024-04-10 MED ORDER — PREDNISONE 10 MG (21) PO TBPK
ORAL_TABLET | ORAL | 0 refills | Status: AC
  Filled 2024-04-10: qty 21, 6d supply, fill #0

## 2024-04-11 ENCOUNTER — Other Ambulatory Visit (HOSPITAL_COMMUNITY): Payer: Self-pay

## 2024-04-16 ENCOUNTER — Ambulatory Visit
Admission: RE | Admit: 2024-04-16 | Discharge: 2024-04-16 | Disposition: A | Discharge status: Home / Self Care | Source: Ambulatory Visit | Attending: Neurological Surgery | Admitting: Neurological Surgery

## 2024-04-16 DIAGNOSIS — M48062 Spinal stenosis, lumbar region with neurogenic claudication: Secondary | ICD-10-CM

## 2024-04-16 MED ORDER — GADOTERIDOL 279.3 MG/ML IV SOLN
6.5000 mL | Freq: Once | INTRAVENOUS | Status: AC | PRN
  Administered 2024-04-16: 6.5 mL via INTRAVENOUS

## 2024-04-24 ENCOUNTER — Other Ambulatory Visit (HOSPITAL_COMMUNITY): Payer: Self-pay

## 2024-04-24 MED ORDER — ROSUVASTATIN CALCIUM 10 MG PO TABS
10.0000 mg | ORAL_TABLET | Freq: Every day | ORAL | 3 refills | Status: DC
  Filled 2024-04-24: qty 100, 100d supply, fill #0

## 2024-04-28 ENCOUNTER — Other Ambulatory Visit (HOSPITAL_COMMUNITY): Payer: Self-pay

## 2024-04-29 ENCOUNTER — Other Ambulatory Visit (HOSPITAL_COMMUNITY): Payer: Self-pay

## 2024-04-30 ENCOUNTER — Other Ambulatory Visit (HOSPITAL_COMMUNITY): Payer: Self-pay

## 2024-05-01 ENCOUNTER — Other Ambulatory Visit (HOSPITAL_COMMUNITY): Payer: Self-pay

## 2024-05-07 ENCOUNTER — Other Ambulatory Visit (HOSPITAL_COMMUNITY): Payer: Self-pay

## 2024-05-07 ENCOUNTER — Telehealth (HOSPITAL_COMMUNITY): Payer: Self-pay

## 2024-05-07 MED ORDER — MORPHINE SULFATE 30 MG PO TABS
30.0000 mg | ORAL_TABLET | Freq: Two times a day (BID) | ORAL | 0 refills | Status: DC | PRN
  Filled 2024-05-07 – 2024-05-10 (×2): qty 60, 30d supply, fill #0

## 2024-05-07 NOTE — Telephone Encounter (Signed)
 Auth Submission: NO AUTH NEEDED Site of care: Site of care: [facility] Payer: [employer] Medicare Medication & CPT/J Code(s) submitted: Feraheme (ferumoxytol ) [id number] Diagnosis Code: D63.1 Route of submission (phone, fax, portal):  Phone # Fax # Auth type: Buy/Bill HB Units/visits requested: 510mg  x 2 doses Reference number: [id number] Approval from: 05/07/24 to 09/07/24

## 2024-05-08 ENCOUNTER — Other Ambulatory Visit (HOSPITAL_COMMUNITY): Payer: Self-pay

## 2024-05-09 ENCOUNTER — Other Ambulatory Visit (HOSPITAL_COMMUNITY): Payer: Self-pay

## 2024-05-10 ENCOUNTER — Other Ambulatory Visit (HOSPITAL_COMMUNITY): Payer: Self-pay

## 2024-05-13 ENCOUNTER — Other Ambulatory Visit (HOSPITAL_COMMUNITY): Payer: Self-pay

## 2024-05-13 MED ORDER — CYCLOBENZAPRINE HCL 10 MG PO TABS
10.0000 mg | ORAL_TABLET | Freq: Every day | ORAL | 1 refills | Status: DC | PRN
  Filled 2024-05-13: qty 30, 30d supply, fill #0
  Filled 2024-06-19: qty 30, 30d supply, fill #1

## 2024-05-16 ENCOUNTER — Other Ambulatory Visit (HOSPITAL_COMMUNITY): Payer: Self-pay | Admitting: *Deleted

## 2024-05-20 ENCOUNTER — Encounter (HOSPITAL_COMMUNITY)
Admission: RE | Admit: 2024-05-20 | Discharge: 2024-05-20 | Disposition: A | Discharge status: Home / Self Care | Source: Ambulatory Visit | Attending: Nephrology | Admitting: Nephrology

## 2024-05-20 DIAGNOSIS — D631 Anemia in chronic kidney disease: Secondary | ICD-10-CM | POA: Diagnosis not present

## 2024-05-20 DIAGNOSIS — N186 End stage renal disease: Secondary | ICD-10-CM | POA: Diagnosis present

## 2024-05-20 MED ORDER — SODIUM CHLORIDE 0.9 % IV SOLN
510.0000 mg | INTRAVENOUS | Status: DC
  Administered 2024-05-20: 510 mg via INTRAVENOUS
  Filled 2024-05-20: qty 510

## 2024-05-26 ENCOUNTER — Other Ambulatory Visit (HOSPITAL_COMMUNITY): Payer: Self-pay

## 2024-05-27 ENCOUNTER — Other Ambulatory Visit (HOSPITAL_COMMUNITY): Payer: Self-pay

## 2024-05-27 ENCOUNTER — Encounter (HOSPITAL_COMMUNITY)
Admission: RE | Admit: 2024-05-27 | Discharge: 2024-05-27 | Disposition: A | Discharge status: Home / Self Care | Source: Ambulatory Visit | Attending: Nephrology | Admitting: Nephrology

## 2024-05-27 DIAGNOSIS — N186 End stage renal disease: Secondary | ICD-10-CM | POA: Diagnosis present

## 2024-05-27 DIAGNOSIS — D631 Anemia in chronic kidney disease: Secondary | ICD-10-CM | POA: Diagnosis not present

## 2024-05-27 MED ORDER — SODIUM CHLORIDE 0.9 % IV SOLN
510.0000 mg | INTRAVENOUS | Status: DC
  Administered 2024-05-27: 510 mg via INTRAVENOUS
  Filled 2024-05-27: qty 510

## 2024-05-27 MED ORDER — LEVOTHYROXINE SODIUM 100 MCG PO TABS
100.0000 ug | ORAL_TABLET | Freq: Every day | ORAL | 3 refills | Status: AC
  Filled 2024-05-27: qty 100, 100d supply, fill #0
  Filled 2024-08-28: qty 100, 100d supply, fill #1

## 2024-05-28 ENCOUNTER — Other Ambulatory Visit: Payer: Self-pay | Admitting: Neurological Surgery

## 2024-06-09 NOTE — Pre-Procedure Instructions (Signed)
 Surgical Instructions   Your procedure is scheduled on June 16, 2024. Report to [facility] Main Entrance A at 12:30 A.M., then check in with the Admitting office. Any questions or running late day of surgery: call [phone]  Questions prior to your surgery date: call [phone], Monday-Friday, 8am-4pm. If you experience any cold or flu symptoms such as cough, fever, chills, shortness of breath, etc. between now and your scheduled surgery, please notify us  at the above number.     Remember:  Do not eat after midnight the night before your surgery  You may drink clear liquids until 11:30 AM the morning of your surgery.   Clear liquids allowed are: Water , Non-Citrus Juices (without pulp), Carbonated Beverages, Clear Tea (no milk, honey, etc.), Black Coffee Only (NO MILK, CREAM OR POWDERED CREAMER of any kind), and Gatorade.    Take these medicines the morning of surgery with A SIP OF WATER : cyclobenzaprine  (FLEXERIL )  escitalopram  (LEXAPRO )  galantamine  (RAZADYNE )  levothyroxine  (SYNTHROID )  morphine  (MS CONTIN )    One week prior to surgery, STOP taking any Aspirin  (unless otherwise instructed by your surgeon) Aleve, Naproxen, Ibuprofen, Motrin, Advil, Goody's, BC's, all herbal medications, fish oil, and non-prescription vitamins.                     Do NOT Smoke (Tobacco/Vaping) for 24 hours prior to your procedure.  If you use a CPAP at night, you may bring your mask/headgear for your overnight stay.   You will be asked to remove any contacts, glasses, piercing's, hearing aid's, dentures/partials prior to surgery. Please bring cases for these items if needed.    Patients discharged the day of surgery will not be allowed to drive home, and someone needs to stay with them for 24 hours.  SURGICAL WAITING ROOM VISITATION Patients may have no more than 2 support people in the waiting area - these visitors may rotate.   Pre-op nurse will coordinate an appropriate time for 1  ADULT support person, who may not rotate, to accompany patient in pre-op.  Children under the age of 97 must have an adult with them who is not the patient and must remain in the main waiting area with an adult.  If the patient needs to stay at the hospital during part of their recovery, the visitor guidelines for inpatient rooms apply.  Please refer to the [facility] website for the visitor guidelines for any additional information.   If you received a COVID test during your pre-op visit  it is requested that you wear a mask when out in public, stay away from anyone that may not be feeling well and notify your surgeon if you develop symptoms. If you have been in contact with anyone that has tested positive in the last 10 days please notify you surgeon.      Pre-operative 5 CHG Bathing Instructions   You can play a key role in reducing the risk of infection after surgery. Your skin needs to be as free of germs as possible. You can reduce the number of germs on your skin by washing with CHG (chlorhexidine  gluconate) soap before surgery. CHG is an antiseptic soap that kills germs and continues to kill germs even after washing.   DO NOT use if you have an allergy to chlorhexidine /CHG or antibacterial soaps. If your skin becomes reddened or irritated, stop using the CHG and notify one of our RNs at [phone].   Please shower with the CHG soap starting 4  days before surgery using the following schedule:     Please keep in mind the following:  DO NOT shave, including legs and underarms, starting the day of your first shower.   You may shave your face at any point before/day of surgery.  Place clean sheets on your bed the day you start using CHG soap. Use a clean washcloth (not used since being washed) for each shower. DO NOT sleep with pets once you start using the CHG.   CHG Shower Instructions:  Wash your face and private area with normal soap. If you choose to wash your hair, wash  first with your normal shampoo.  After you use shampoo/soap, rinse your hair and body thoroughly to remove shampoo/soap residue.  Turn the water  OFF and apply about 3 tablespoons (45 ml) of CHG soap to a CLEAN washcloth.  Apply CHG soap ONLY FROM YOUR NECK DOWN TO YOUR TOES (washing for 3-5 minutes)  DO NOT use CHG soap on face, private areas, open wounds, or sores.  Pay special attention to the area where your surgery is being performed.  If you are having back surgery, having someone wash your back for you may be helpful. Wait 2 minutes after CHG soap is applied, then you may rinse off the CHG soap.  Pat dry with a clean towel  Put on clean clothes/pajamas   If you choose to wear lotion, please use ONLY the CHG-compatible lotions that are listed below.  Additional instructions for the day of surgery: DO NOT APPLY any lotions, deodorants, cologne, or perfumes.   Do not bring valuables to the hospital. [facility] is not responsible for any belongings/valuables. Do not wear nail polish, gel polish, artificial nails, or any other type of covering on natural nails (fingers and toes) Do not wear jewelry or makeup Put on clean/comfortable clothes.  Please brush your teeth.  Ask your nurse before applying any prescription medications to the skin.     CHG Compatible Lotions   Aveeno Moisturizing lotion  Cetaphil Moisturizing Cream  Cetaphil Moisturizing Lotion  Clairol Herbal Essence Moisturizing Lotion, Dry Skin  Clairol Herbal Essence Moisturizing Lotion, Extra Dry Skin  Clairol Herbal Essence Moisturizing Lotion, Normal Skin  Curel Age Defying Therapeutic Moisturizing Lotion with Alpha Hydroxy  Curel Extreme Care Body Lotion  Curel Soothing Hands Moisturizing Hand Lotion  Curel Therapeutic Moisturizing Cream, Fragrance-Free  Curel Therapeutic Moisturizing Lotion, Fragrance-Free  Curel Therapeutic Moisturizing Lotion, Original Formula  Eucerin Daily Replenishing Lotion  Eucerin Dry  Skin Therapy Plus Alpha Hydroxy Crme  Eucerin Dry Skin Therapy Plus Alpha Hydroxy Lotion  Eucerin Original Crme  Eucerin Original Lotion  Eucerin Plus Crme Eucerin Plus Lotion  Eucerin TriLipid Replenishing Lotion  Keri Anti-Bacterial Hand Lotion  Keri Deep Conditioning Original Lotion Dry Skin Formula Softly Scented  Keri Deep Conditioning Original Lotion, Fragrance Free Sensitive Skin Formula  Keri Lotion Fast Absorbing Fragrance Free Sensitive Skin Formula  Keri Lotion Fast Absorbing Softly Scented Dry Skin Formula  Keri Original Lotion  Keri Skin Renewal Lotion Keri Silky Smooth Lotion  Keri Silky Smooth Sensitive Skin Lotion  Nivea Body Creamy Conditioning Oil  Nivea Body Extra Enriched Lotion  Nivea Body Original Lotion  Nivea Body Sheer Moisturizing Lotion Nivea Crme  Nivea Skin Firming Lotion  NutraDerm 30 Skin Lotion  NutraDerm Skin Lotion  NutraDerm Therapeutic Skin Cream  NutraDerm Therapeutic Skin Lotion  ProShield Protective Hand Cream  Provon moisturizing lotion  Please read over the following fact sheets that you were  given.

## 2024-06-10 ENCOUNTER — Other Ambulatory Visit: Payer: Self-pay

## 2024-06-10 ENCOUNTER — Encounter (HOSPITAL_COMMUNITY): Payer: Self-pay

## 2024-06-10 ENCOUNTER — Encounter (HOSPITAL_COMMUNITY)
Admission: RE | Admit: 2024-06-10 | Discharge: 2024-06-10 | Disposition: A | Discharge status: Home / Self Care | Source: Ambulatory Visit | Attending: Neurological Surgery | Admitting: Neurological Surgery

## 2024-06-10 VITALS — BP 162/86 | HR 83 | Temp 98.1°F | Resp 16 | Ht 65.0 in | Wt 157.0 lb

## 2024-06-10 DIAGNOSIS — Z85528 Personal history of other malignant neoplasm of kidney: Secondary | ICD-10-CM | POA: Insufficient documentation

## 2024-06-10 DIAGNOSIS — Z8543 Personal history of malignant neoplasm of ovary: Secondary | ICD-10-CM | POA: Diagnosis not present

## 2024-06-10 DIAGNOSIS — Z01810 Encounter for preprocedural cardiovascular examination: Secondary | ICD-10-CM | POA: Diagnosis present

## 2024-06-10 DIAGNOSIS — Z85118 Personal history of other malignant neoplasm of bronchus and lung: Secondary | ICD-10-CM | POA: Diagnosis not present

## 2024-06-10 DIAGNOSIS — R0609 Other forms of dyspnea: Secondary | ICD-10-CM | POA: Diagnosis not present

## 2024-06-10 DIAGNOSIS — I5032 Chronic diastolic (congestive) heart failure: Secondary | ICD-10-CM | POA: Diagnosis not present

## 2024-06-10 DIAGNOSIS — Z96612 Presence of left artificial shoulder joint: Secondary | ICD-10-CM | POA: Diagnosis not present

## 2024-06-10 DIAGNOSIS — Z9889 Other specified postprocedural states: Secondary | ICD-10-CM | POA: Diagnosis not present

## 2024-06-10 DIAGNOSIS — F0390 Unspecified dementia without behavioral disturbance: Secondary | ICD-10-CM | POA: Insufficient documentation

## 2024-06-10 DIAGNOSIS — Z87891 Personal history of nicotine dependence: Secondary | ICD-10-CM | POA: Diagnosis not present

## 2024-06-10 DIAGNOSIS — M48062 Spinal stenosis, lumbar region with neurogenic claudication: Secondary | ICD-10-CM | POA: Insufficient documentation

## 2024-06-10 DIAGNOSIS — Z96641 Presence of right artificial hip joint: Secondary | ICD-10-CM | POA: Diagnosis not present

## 2024-06-10 DIAGNOSIS — N184 Chronic kidney disease, stage 4 (severe): Secondary | ICD-10-CM | POA: Diagnosis not present

## 2024-06-10 DIAGNOSIS — I2510 Atherosclerotic heart disease of native coronary artery without angina pectoris: Secondary | ICD-10-CM | POA: Insufficient documentation

## 2024-06-10 DIAGNOSIS — Z96653 Presence of artificial knee joint, bilateral: Secondary | ICD-10-CM | POA: Insufficient documentation

## 2024-06-10 DIAGNOSIS — Z85820 Personal history of malignant melanoma of skin: Secondary | ICD-10-CM | POA: Insufficient documentation

## 2024-06-10 DIAGNOSIS — M797 Fibromyalgia: Secondary | ICD-10-CM | POA: Diagnosis not present

## 2024-06-10 DIAGNOSIS — Z01818 Encounter for other preprocedural examination: Secondary | ICD-10-CM | POA: Diagnosis not present

## 2024-06-10 DIAGNOSIS — D631 Anemia in chronic kidney disease: Secondary | ICD-10-CM | POA: Diagnosis not present

## 2024-06-10 DIAGNOSIS — I872 Venous insufficiency (chronic) (peripheral): Secondary | ICD-10-CM | POA: Insufficient documentation

## 2024-06-10 DIAGNOSIS — Z981 Arthrodesis status: Secondary | ICD-10-CM | POA: Insufficient documentation

## 2024-06-10 DIAGNOSIS — E039 Hypothyroidism, unspecified: Secondary | ICD-10-CM | POA: Diagnosis not present

## 2024-06-10 DIAGNOSIS — Z01812 Encounter for preprocedural laboratory examination: Secondary | ICD-10-CM | POA: Diagnosis present

## 2024-06-10 HISTORY — DX: Atherosclerotic heart disease of native coronary artery without angina pectoris: I25.10

## 2024-06-10 HISTORY — DX: Unspecified dementia, unspecified severity, without behavioral disturbance, psychotic disturbance, mood disturbance, and anxiety: F03.90

## 2024-06-10 LAB — BASIC METABOLIC PANEL WITH GFR
Anion gap: 10 (ref 5–15)
BUN: 39 mg/dL — ABNORMAL HIGH (ref 8–23)
CO2: 20 mmol/L — ABNORMAL LOW (ref 22–32)
Calcium: 10.3 mg/dL (ref 8.9–10.3)
Chloride: 107 mmol/L (ref 98–111)
Creatinine, Ser: 1.66 mg/dL — ABNORMAL HIGH (ref 0.44–1.00)
GFR, Estimated: 32 mL/min — ABNORMAL LOW (ref >60)
Glucose, Bld: 78 mg/dL (ref 70–99)
Potassium: 4.5 mmol/L (ref 3.5–5.1)
Sodium: 137 mmol/L (ref 135–145)

## 2024-06-10 LAB — CBC
HCT: 34.3 % — ABNORMAL LOW (ref 36.0–46.0)
Hemoglobin: 10.8 g/dL — ABNORMAL LOW (ref 12.0–15.0)
MCH: 31.6 pg (ref 26.0–34.0)
MCHC: 31.5 g/dL (ref 30.0–36.0)
MCV: 100.3 fL — ABNORMAL HIGH (ref 80.0–100.0)
Platelets: 131 K/uL — ABNORMAL LOW (ref 150–400)
RBC: 3.42 MIL/uL — ABNORMAL LOW (ref 3.87–5.11)
RDW: 13 % (ref 11.5–15.5)
WBC: 5.9 K/uL (ref 4.0–10.5)
nRBC: 0 % (ref 0.0–0.2)

## 2024-06-10 LAB — SURGICAL PCR SCREEN
MRSA, PCR: NEGATIVE
Staphylococcus aureus: POSITIVE — AB

## 2024-06-10 NOTE — Progress Notes (Signed)
 PCP - [name] with [facility] Cardiologist - Dr. Marcello Lennox - last office visit 09/28/2022 with 4 month follow-up, but pt has not been back. Nephrologist - Dr. Gordy Blanch  PPM/ICD - Denies Device Orders - n/a Rep Notified - n/a  Chest x-ray - n/a EKG - 06/10/2024 Stress Test - 12/19/2012 ECHO - 09/19/2023 Cardiac Cath - Denies  Sleep Study - Denies CPAP - n/a  No DM  Last dose of GLP1 agonist- n/a GLP1 instructions: n/a  Blood Thinner Instructions: n/a Aspirin  Instructions: n/a  ERAS Protcol - Clear liquids until 1130 morning of surgery PRE-SURGERY Ensure or G2- n/a  COVID TEST- n/a   Anesthesia review: Yes. Discussed with [name], PA-C. Pt was supposed to complete a heart monitor/stress test after cardiology appointment in 2023, but she did not complete. MD wanted a four month follow-up for which she did not return. She had an echo completed 08/2023 which showed mild diastolic dysfunction and also noted to follow-up with cardiologist, which she did not do. She denies any CP at PAT appointment. She is able to do all ADLs at home without cardiopulmonary symptoms. Pt states she is unable to walk up two flights of stairs without getting SOB, but wonder how much is related to current back issues.   Patient denies shortness of breath, fever, cough and chest pain at PAT appointment. Pt denies any respiratory illness/infection in the last two months   All instructions explained to the patient, with a verbal understanding of the material. Patient agrees to go over the instructions while at home for a better understanding. Patient also instructed to self quarantine after being tested for COVID-19. The opportunity to ask questions was provided.

## 2024-06-10 NOTE — Progress Notes (Signed)
 Anesthesia Chart Review:  Case: [id number] Date/Time: 06/16/24 1419   Procedure: LUMBAR LAMINECTOMY/DECOMPRESSION MICRODISCECTOMY 2 LEVELS (Back) - Sublaminar decompression - L1-L2 - L2-L3   Anesthesia type: General   Diagnosis: Spinal stenosis, lumbar region, with neurogenic claudication [M48.062]   Pre-op diagnosis: Spinal stenosis lumbar region with neurogenic claudication   [facility] ROOM [facility] / [facility]   Surgeons: Colon Shove, MD       DISCUSSION:  Reviewed with Epifanio Charleston, MD. Recommend preoperative cardiology evaluation for elective surgery.   Patient and daughter notified. I also notified Nikki at Dr. Thayer office.   VS: BP (!) 162/86   Pulse 83   Temp 36.7 C   Resp 16   Ht 5' 5 (1.651 m)   Wt 71.2 kg   SpO2 100%   BMI 26.13 kg/m   PROVIDERS: Pura Lenis, MD   LABS: {CHL AN LABS REVIEWED:112001::Labs reviewed: Acceptable for surgery.} (all labs ordered are listed, but only abnormal results are displayed)  Labs Reviewed  SURGICAL PCR SCREEN  BASIC METABOLIC PANEL WITH GFR  CBC     IMAGES:   EKG:   CV:  Past Medical History:  Diagnosis Date   Anemia    Anxiety    Arthritis    everywhere (08/19/2013)   Bilateral carpal tunnel syndrome    Chest pain at rest, rule out pericarditis 11/27/2012   CHF (congestive heart failure) (HCC)    Chronic kidney disease    dr alline   Chronic lower back pain    Closed displaced fracture of right femoral neck (HCC) 02/18/2019   Complication of anesthesia    woke up twice during knee replacement (08/19/2013)   Coronary artery disease    Dementia (HCC)    Depression    Dyspnea    w/ exertion     Fibromyalgia    Headache(784.0)    hx of migraines. none for a long time   History of pericarditis, with pericardial window in 2009 11/27/2012   Hx of ovarian cancer 11/27/2012   Hypothyroidism 11/27/2012   Iron deficiency anemia    Kidney carcinoma (HCC)    both kidneys; ~ 3 yr apart  (08/19/2013)   Lung cancer (HCC) 2018   Melanoma of back (HCC)    Migraines    Pneumonia    several times; including today, RUL/notes 08/19/2013   Squamous carcinoma    face, side of my nose, chest; used acid to get rid of them (08/19/2013)   Venous insufficiency 11/27/2012    Past Surgical History:  Procedure Laterality Date   ABDOMINAL HYSTERECTOMY  1979   ANTERIOR LAT LUMBAR FUSION Left 07/16/2018   Procedure: Left Lumbar two-three  Anterolateral decompression/interbody fusion with lateral plate fixation;  Surgeon: Colon Shove, MD;  Location: [facility] OR;  Service: Neurosurgery;  Laterality: Left;   APPENDECTOMY     COLON RESECTION  07/2019   DOPPLER ECHOCARDIOGRAPHY  02/23/2011   LVEF>50%   FRACTURE SURGERY     KNEE ARTHROSCOPY Right    twice before the replacement (08/19/2013)   LEFT OOPHORECTOMY Left 1979   LUMBAR DISC SURGERY  1984   ruptured disc   MELANOMA EXCISION  ~ 2010   my lower back (08/19/2013)   ORIF HIP FRACTURE Left    x 2 age 79 and 4   PARTIAL NEPHRECTOMY Right 2005; ~ 2008   PERICARDIAL WINDOW  ~ 2012   POSTERIOR LUMBAR FUSION  2002?; 03/2012   REDUCTION MAMMAPLASTY Bilateral ~ 2008   RIGHT OOPHORECTOMY  ~  2007   it had cancer in it (08/19/2013)   SHOULDER ARTHROSCOPY W/ ROTATOR CUFF REPAIR Right 1990's   TONSILLECTOMY  1954   TOTAL HIP ARTHROPLASTY Right 02/18/2019   Procedure: TOTAL HIP ARTHROPLASTY;  Surgeon: Josefina Chew, MD;  Location: [facility];  Service: Orthopedics;  Laterality: Right;   TOTAL HIP REVISION Right 03/27/2019   Procedure: TOTAL HIP REVISION;  Surgeon: Ernie Cough, MD;  Location: [facility]S;  Service: Orthopedics;  Laterality: Right;   TOTAL HIP REVISION Right 08/12/2020   Procedure: RIGHT TOTAL HIP REVISION TO CONSTRAINED LINER;  Surgeon: Ernie Cough, MD;  Location: [facility];  Service: Orthopedics;  Laterality: Right;  90 mins   TOTAL KNEE ARTHROPLASTY Right 1990's   TOTAL KNEE ARTHROPLASTY Left 08/02/2015   Procedure: LEFT  TOTAL KNEE ARTHROPLASTY;  Surgeon: Cough Ernie, MD;  Location: [facility];  Service: Orthopedics;  Laterality: Left;   TOTAL SHOULDER REPLACEMENT Left 2006?   TUBAL LIGATION     VIDEO ASSISTED THORACOSCOPY (VATS)/WEDGE RESECTION Right 09/28/2017   Procedure: RIGHT VIDEO ASSISTED THORACOSCOPY (VATS)/WEDGE RESECTION, LYMPH NODE DISSECTION ;  Surgeon: Kerrin Elspeth BROCKS, MD;  Location: [facility];  Service: Thoracic;  Laterality: Right;    MEDICATIONS:  acetaminophen  (TYLENOL ) 500 MG tablet   albuterol  (VENTOLIN  HFA) 108 (90 Base) MCG/ACT inhaler   buPROPion  (WELLBUTRIN  SR) 150 MG 12 hr tablet   cyclobenzaprine  (FLEXERIL ) 10 MG tablet   doxepin  (SINEQUAN ) 10 MG capsule   escitalopram  (LEXAPRO ) 10 MG tablet   ferrous sulfate  (FERROUSUL) 325 (65 FE) MG tablet   furosemide  (LASIX ) 20 MG tablet   galantamine  (RAZADYNE ) 4 MG tablet   galantamine  (RAZADYNE ) 8 MG tablet   levothyroxine  (SYNTHROID ) 100 MCG tablet   MAGNESIUM  PO   methocarbamol  (ROBAXIN ) 500 MG tablet   morphine  (MS CONTIN ) 30 MG 12 hr tablet   morphine  (MSIR) 30 MG tablet   morphine  (MSIR) 30 MG tablet   morphine  (MSIR) 30 MG tablet   morphine  (MSIR) 30 MG tablet   No current facility-administered medications for this encounter.

## 2024-06-11 ENCOUNTER — Other Ambulatory Visit (HOSPITAL_COMMUNITY): Payer: Self-pay

## 2024-06-11 ENCOUNTER — Telehealth: Payer: Self-pay | Admitting: Nurse Practitioner

## 2024-06-11 DIAGNOSIS — R0609 Other forms of dyspnea: Secondary | ICD-10-CM

## 2024-06-11 MED ORDER — MORPHINE SULFATE 30 MG PO TABS
30.0000 mg | ORAL_TABLET | Freq: Two times a day (BID) | ORAL | 0 refills | Status: AC | PRN
  Filled 2024-06-11: qty 60, 30d supply, fill #0

## 2024-06-11 NOTE — Telephone Encounter (Signed)
 PT has been scheduled for a PFT in nov. The current order will expire in OCT can a new order be placed to cover the the APT.

## 2024-06-12 NOTE — Telephone Encounter (Signed)
 New order has been placed.

## 2024-06-13 NOTE — Anesthesia Preprocedure Evaluation (Signed)
 Anesthesia Evaluation    Airway        Dental   Pulmonary former smoker          Cardiovascular      Neuro/Psych    GI/Hepatic   Endo/Other    Renal/GU      Musculoskeletal   Abdominal   Peds  Hematology   Anesthesia Other Findings   Reproductive/Obstetrics                              Anesthesia Physical Anesthesia Plan  ASA:   Anesthesia Plan:    Post-op Pain Management:    Induction:   PONV Risk Score and Plan:   Airway Management Planned:   Additional Equipment:   Intra-op Plan:   Post-operative Plan:   Informed Consent:   Plan Discussed with:   Anesthesia Plan Comments: (See PAT note written by Daisa Stennis, PA-C. Preoperative [facility] cardiology evaluation with Dr. Uvaldo on 06/13/24 at 3:15 PM. Disposition still pending as of 5:15 PM on 06/13/2024. Dr. Thayer staff to follow-up by 06/13/24 AM.   )        Anesthesia Quick Evaluation

## 2024-06-13 NOTE — Progress Notes (Signed)
 Office Follow up Note  06/13/2024  Interval History:  [name] is  73 y.o. yrs old female who returns in follow-up today. She does have longstanding history of hypertension, history of partial left nephrectomy for RCC, CKD stage III/4, anemia secondary to CKD, mild hyperlipidemia comes here for follow-up visit. She initially came to see me for evaluation of exertional dyspnea.  Stress test was recommended which she has not completed yet.  There is no report of orthopnea or PND or lower extreme edema.  She gets shortness of breath upon walking more than 200 feet.  She does have issue with chronic back pain and is going to have neuro surgery.  Prior to surgery, she is here for cardiovascular workup/evaluation.  Since I saw her more than a year ago, there is no report of chest pain or discomfort.  Patient is fairly functional.  She is limited in terms of exercise/brisk walking because of low back pain and sciatica.  I cannot assess her functional status with confidence. There is no report of orthopnea or PND.  No lower extremity edema.  No chest pain at rest.  Today, she was accompanied by her granddaughter.   Currently she does not smoke, does not drink alcohol.  She is a status post quit smoking, has more than 10-pack-year history.  She is retired.  No family history of sudden cardiac death or premature coronary artery disease.    EKG today shows sinus rhythm, no ischemia, no ectopies, poor R wave progression of the precordial leads.   CARDIOVASCULAR WORK UP  Echocardiogram December 2024,     1. Left ventricular ejection fraction, by estimation, is 55 to 60%. The left ventricle has normal function. The left ventricle has no regional wall motion abnormalities. Left ventricular diastolic parameters are consistent with Grade I diastolic  dysfunction (impaired relaxation).   2. Right ventricular systolic function  is normal. The right ventricular size is normal. There is normal pulmonary artery systolic pressure.   3. Left atrial size was mildly dilated.   4. The mitral valve is abnormal. Mild mitral valve regurgitation. No evidence of mitral stenosis.   5. The aortic valve is tricuspid. There is mild calcification of the aortic valve. Aortic valve regurgitation is trivial. Aortic valve sclerosis is present, with no evidence of aortic valve stenosis.   6. The inferior vena cava is normal in size with greater than 50% respiratory variability, suggesting right atrial pressure of 3 mmHg.   Medications:  Current Medications[1]  REVIEW OF SYSTEMS: The patient denies nausea, vomiting, diarrhea, fever, chills, or bleeding. All other systems are reviewed and are negative except for that mentioned in the history of present illness.  LABS:   Lab Results  Component Value Date   Cholesterol, Total 222 (H) 12/08/2019   Cholesterol, Total 247 (H) 11/14/2018   Lab Results  Component Value Date   HDL 88 12/08/2019   HDL 83 11/14/2018   Lab Results  Component Value Date   LDL 106 (H) 12/08/2019   LDL 147 (H) 11/14/2018   Lab Results  Component Value Date   Triglycerides 166 (H) 12/08/2019   Triglycerides 85 11/14/2018   No results found for: [facility]  Lab Results  Component Value Date   WBC 6.5 08/31/2023   HGB 9.7 (L) 08/31/2023   HCT 27.7 (L) 08/31/2023   MCV 85.0 08/31/2023   Plt Ct 203 08/31/2023    Lab Results  Component Value Date   Creatinine 2.45 (H) 08/31/2023  BUN 56 (H) 08/31/2023   Sodium 138 08/31/2023   Potassium 3.3 (L) 08/31/2023   Chloride 97 08/31/2023   CO2 21 08/31/2023   Lab Results  Component Value Date   TSH 1.690 06/21/2021    Results for orders placed or performed during the hospital encounter of 07/24/19  Hemoglobin A1c  Result Value Ref Range   Hemoglobin A1c 5.2 4.8 - 5.6 %   Narrative   Reference Interval:                  4.8 - 5.6% Increased Risk  for Diabetes:         5.7 - 6.4% Diabetes:                             >=6.5% Glycemic Control for Adults with Diabetes:                        <7.0%     Lab Results  Component Value Date   INR 1.2 01/09/2020   PHYSICAL EXAM:  Vital Signs:   BP 123/67 (BP Location: Left Upper Arm, Patient Position: Sitting)   Pulse 80   Ht 5' 5.5 (1.664 m)   Wt 163 lb (73.9 kg)   SpO2 91%   BMI 26.71 kg/m  Constitutional: Well-nourished well-developed ,  no acute distress.  HENT:normocephalic,atraumatic,oropharynx moist,no oral exudates.   Neck: Supple. No JVD. Carotid bruits: Absent bilaterally. Cardiovascular: RRR. No murmur, No gallop. No rub. Respiratory: Mildly decreased breath sounds bilaterally.  No wheezes or crackles noted GI: Soft, nontender, nondistended, with normal bowel sounds, no masses or hepatosplenomegaly.  Skin: Warm, dry, no erythema, no rash. Musculoskeletal: No edema, no tenderness, no cyanosis, no clubbing. Pulses: 1+ and intact bilaterally  Impression and plan:    1. Dyspnea, unspecified type  NM Heart Spect Ph Stress (C)    2. Pre-operative cardiovascular examination  ECG 12 lead   NM Heart Spect Ph Stress (C)    3. Chronic kidney disease, stage 3b (*)      4. Anemia, unspecified type      5. Coronary artery calcification      6. Hyperlipidemia, unspecified hyperlipidemia type        She does have chronic exertional dyspnea.  She is not in heart failure clinically.  She had a history of smoking in the past.  Possibly has COPD.  She also has chronic anemia secondary to CKD stage III/stage IV at baseline.  Given poor functional status, will consider stress test prior to low back surgery.  I reviewed recent echocardiogram from last year, no major structural abnormality.  CT chest in the past showed coronary artery calcification.  If the stress test comes as low risk feature, she does not need further cardiac workup.  She does have mild hyperlipidemia  with  coronary artery calcification but we discussed about statin, patient refused it.  Follow-up in 6 to 9 months.  Will request nuclear stress test sooner as she is planning to have surgery as soon as possible.    We had discussion about importance of life style modification, keeping BP, blood sugar and cholesterol, body weight at goal, role of regular exercise (at least 30 minutes of moderate intensity aerobic exercise 5 days in a week), following low fat, low carb, low salt heart healthy diet, abstinence from smoking and drinking etc.  We also discussed about visiting PCP visit for non  cardiac issues, importance of regular follow up, health screening  and medication compliance.  Marcello MARLA Lennox, MD    Note: This documented was generated using voice recognition software. There may be unintended transcription errors that were not detected upon document review.               [1]  Current Outpatient Medications:  .  albuterol  sulfate HFA (PROVENTIL ,VENTOLIN ,PROAIR ) 108 (90 Base) MCG/ACT inhaler, Inhale two puffs into the lungs every 6 (six) hours as needed., Disp: , Rfl:  .  buPROPion  (WELLBUTRIN  SR) 150 mg 12 hr tablet, TAKE 1 TABLET BY MOUTH TWICE DAILY, Disp: 180 tablet, Rfl: 3 .  busPIRone  (BUSPAR ) 15 MG tablet, TAKE ONE-HALF TABLET BY MOUTH TWICE DAILY AS NEEDED (Patient not taking: Reported on 06/13/2024), Disp: 30 tablet, Rfl: 2 .  cyclobenzaprine  (FLEXERIL ) 10 mg tablet, Take one tablet (10 mg dose) by mouth daily as needed., Disp: , Rfl:  .  ergocalciferol (VITAMIN D2) 50,000 units CAPS capsule, TAKE ONE CAPSULE BY MOUTH ONCE a WEEK, Disp: 12 capsule, Rfl: 0 .  escitalopram  oxalate (LEXAPRO ) 10 mg tablet, TAKE ONE TABLET BY MOUTH DAILY, Disp: 30 tablet, Rfl: 2 .  ferrous sulfate  (FERROUS SULFATE ) 325 (65 FE) MG tablet, Take one tablet (325 mg dose) by mouth daily., Disp: 30 tablet, Rfl: 3 .  galantamine  (RAZADYNE ) 8 mg tablet, Take 1 tablet (8 mg total) by mouth 2 (two) times a day.,  Disp: , Rfl:  .  galantamine  (RAZADYNE ) 8 mg tablet, Take one tablet (8 mg dose) by mouth 2 (two) times daily., Disp: , Rfl:  .  levothyroxine  sodium (SYNTHROID ,LEVOTHROID,LEVOXYL ) 75 mcg tablet, TAKE 1 TABLET BY MOUTH ONCE DAILY, Disp: 90 tablet, Rfl: 0 .  loperamide (IMODIUM) 2 MG capsule, Take one capsule (2 mg dose) by mouth 4 (four) times daily., Disp: 240 capsule, Rfl: 6 .  methocarbamol  (ROBAXIN ) 500 MG tablet, Take one tablet (500 mg dose) by mouth 2 (two) times daily., Disp: 180 tablet, Rfl: 3 .  morphine  sulfate ER (MS CONTIN ) 30 mg 12 hr tablet, Take one tablet (30 mg dose) by mouth 2 (two) times daily., Disp: 60 tablet, Rfl: 0 .  Naloxone  HCl 4 MG/0.1ML LIQD nasal spray, one spray by Nasal route once as needed for up to 1 dose., Disp: 1 each, Rfl: 0 .  pantoprazole  sodium (PROTONIX ) 40 mg tablet, Take one tablet (40 mg dose) by mouth daily., Disp: 90 tablet, Rfl: 1 .  phentermine (ADIPEX-P) 37.5 MG tablet, Take one tablet (37.5 mg dose) by mouth every morning. (Patient not taking: Reported on 06/13/2024), Disp: , Rfl:  .  rosuvastatin  calcium  (CRESTOR ) 10 mg tablet, Take one tablet (10 mg dose) by mouth at bedtime., Disp: , Rfl:  .  Suvorexant (BELSOMRA) 5 MG TABS, Take one tablet (5 mg dose) by mouth at bedtime. (Patient not taking: Reported on 06/13/2024), Disp: , Rfl:  .  triamterene -hydrochlorothiazide  (MAXZIDE ) 75-50 mg per tablet, Take one tablet by mouth daily. (Patient not taking: Reported on 06/13/2024), Disp: , Rfl:  .  Vitamins/Minerals TABS, Take by mouth daily. (Patient not taking: Reported on 06/13/2024), Disp: , Rfl:

## 2024-06-16 ENCOUNTER — Ambulatory Visit (HOSPITAL_COMMUNITY): Admission: RE | Admit: 2024-06-16 | Source: Home / Self Care | Admitting: Neurological Surgery

## 2024-06-16 NOTE — H&P (Signed)
 [name] is an 73 y.o. female.   Chief Complaint: Back and bilateral leg pain HPI: Patient is a 74 year old individual whose had significant back and bilateral leg pain has been progressively worse over the last couple of years.  She has had previous surgical decompression and arthrodesis from L3 to the sacrum.  An MRI was performed about a month ago which showed this progression.  She has previously responded well to intermittent epidural steroid injections but these have become refractory.  She has been advised regarding surgical decompression at L1-2 and L2-3.  Past Medical History:  Diagnosis Date   Anemia    Anxiety    Arthritis    everywhere (08/19/2013)   Bilateral carpal tunnel syndrome    Chest pain at rest, rule out pericarditis 11/27/2012   CHF (congestive heart failure) (HCC)    Chronic kidney disease    dr alline   Chronic lower back pain    Closed displaced fracture of right femoral neck (HCC) 02/18/2019   Complication of anesthesia    woke up twice during knee replacement (08/19/2013)   Coronary artery disease    Dementia (HCC)    Depression    Dyspnea    w/ exertion     Fibromyalgia    Headache(784.0)    hx of migraines. none for a long time   History of pericarditis, with pericardial window in 2009 11/27/2012   Hx of ovarian cancer 11/27/2012   Hypothyroidism 11/27/2012   Iron deficiency anemia    Kidney carcinoma (HCC)    both kidneys; ~ 3 yr apart (08/19/2013)   Lung cancer (HCC) 2018   Melanoma of back (HCC)    Migraines    Pneumonia    several times; including today, RUL/notes 08/19/2013   Squamous carcinoma    face, side of my nose, chest; used acid to get rid of them (08/19/2013)   Venous insufficiency 11/27/2012    Past Surgical History:  Procedure Laterality Date   ABDOMINAL HYSTERECTOMY  1979   ANTERIOR LAT LUMBAR FUSION Left 07/16/2018   Procedure: Left Lumbar two-three  Anterolateral decompression/interbody fusion with lateral  plate fixation;  Surgeon: Colon Shove, MD;  Location: [facility] OR;  Service: Neurosurgery;  Laterality: Left;   APPENDECTOMY     COLON RESECTION  07/2019   DOPPLER ECHOCARDIOGRAPHY  02/23/2011   LVEF>50%   FRACTURE SURGERY     KNEE ARTHROSCOPY Right    twice before the replacement (08/19/2013)   LEFT OOPHORECTOMY Left 1979   LUMBAR DISC SURGERY  1984   ruptured disc   MELANOMA EXCISION  ~ 2010   my lower back (08/19/2013)   ORIF HIP FRACTURE Left    x 2 age 20 and 4   PARTIAL NEPHRECTOMY Right 2005; ~ 2008   PERICARDIAL WINDOW  ~ 2012   POSTERIOR LUMBAR FUSION  2002?; 03/2012   REDUCTION MAMMAPLASTY Bilateral ~ 2008   RIGHT OOPHORECTOMY  ~ 2007   it had cancer in it (08/19/2013)   SHOULDER ARTHROSCOPY W/ ROTATOR CUFF REPAIR Right 1990's   TONSILLECTOMY  1954   TOTAL HIP ARTHROPLASTY Right 02/18/2019   Procedure: TOTAL HIP ARTHROPLASTY;  Surgeon: Josefina Chew, MD;  Location: [facility] OR;  Service: Orthopedics;  Laterality: Right;   TOTAL HIP REVISION Right 03/27/2019   Procedure: TOTAL HIP REVISION;  Surgeon: Ernie Cough, MD;  Location: [facility];  Service: Orthopedics;  Laterality: Right;   TOTAL HIP REVISION Right 08/12/2020   Procedure: RIGHT TOTAL HIP REVISION TO CONSTRAINED LINER;  Surgeon: Ernie Cough, MD;  Location: [facility];  Service: Orthopedics;  Laterality: Right;  90 mins   TOTAL KNEE ARTHROPLASTY Right 1990's   TOTAL KNEE ARTHROPLASTY Left 08/02/2015   Procedure: LEFT TOTAL KNEE ARTHROPLASTY;  Surgeon: Cough Ernie, MD;  Location: [facility];  Service: Orthopedics;  Laterality: Left;   TOTAL SHOULDER REPLACEMENT Left 2006?   TUBAL LIGATION     VIDEO ASSISTED THORACOSCOPY (VATS)/WEDGE RESECTION Right 09/28/2017   Procedure: RIGHT VIDEO ASSISTED THORACOSCOPY (VATS)/WEDGE RESECTION, LYMPH NODE DISSECTION ;  Surgeon: Kerrin Elspeth BROCKS, MD;  Location: [facility] OR;  Service: Thoracic;  Laterality: Right;    Family History  Problem Relation Age of Onset   Cancer Mother 85        Stomach   Cancer Father 73       Lung cancer   Bipolar disorder Sister    Social History:  reports that she quit smoking about 38 years ago. Her smoking use included cigarettes. She started smoking about 56 years ago. She has a 27 pack-year smoking history. She has never used smokeless tobacco. She reports that she does not drink alcohol and does not use drugs.  Allergies:  Allergies  Allergen Reactions   Amoxicillin Other (See Comments)    UNSPECIFIED REACTION  Has patient had a PCN reaction causing immediate rash, facial/tongue/throat swelling, SOB or lightheadedness with hypotension: No Has patient had a PCN reaction causing severe rash involving mucus membranes or skin necrosis: No Has patient had a PCN reaction that required hospitalization No Has patient had a PCN reaction occurring within the last 10 years: No If all of the above answers are NO, then may proceed with Cephalosporin use.   Donepezil      Leg pain   Ampicillin Rash and Other (See Comments)    Has patient had a PCN reaction causing immediate rash, facial/tongue/throat swelling, SOB or lightheadedness with hypotension: No Has patient had a PCN reaction causing severe rash involving mucus membranes or skin necrosis: No Has patient had a PCN reaction that required hospitalization No Has patient had a PCN reaction occurring within the last 10 years: No If all of the above answers are NO, then may proceed with Cephalosporin use.    No medications prior to admission.    No results found for this or any previous visit (from the past 48 hours). No results found.  Review of Systems  There were no vitals taken for this visit. Physical Exam Constitutional:      Appearance: Normal appearance.  HENT:     Head: Normocephalic and atraumatic.     Right Ear: Tympanic membrane, ear canal and external ear normal.     Left Ear: Tympanic membrane, ear canal and external ear normal.     Nose: Nose normal.      Mouth/Throat:     Mouth: Mucous membranes are moist.     Pharynx: Oropharynx is clear.  Eyes:     Conjunctiva/sclera: Conjunctivae normal.     Pupils: Pupils are equal, round, and reactive to light.  Cardiovascular:     Rate and Rhythm: Normal rate and regular rhythm.     Pulses: Normal pulses.  Pulmonary:     Effort: Pulmonary effort is normal.     Breath sounds: Normal breath sounds.  Abdominal:     General: Abdomen is flat.     Palpations: Abdomen is soft.  Musculoskeletal:        General: Normal range of motion.     Cervical back: Normal  range of motion and neck supple.  Skin:    General: Skin is warm and dry.     Capillary Refill: Capillary refill takes less than 2 seconds.  Neurological:     Mental Status: She is alert.     Comments: Generalized weakness in the lower extremities with decreased tone and bulk in the iliopsoas in the quads absent reflexes in the patella and the Achilles both.  Upper extremity reflexes are intact.  Upper extremity strength and cranial nerve examination is normal.  Psychiatric:        Mood and Affect: Mood normal.        Behavior: Behavior normal.        Thought Content: Thought content normal.        Judgment: Judgment normal.      Assessment/Plan Lumbar spinal stenosis L1-2 and L2-3.  Plan lumbar laminectomy L1-2 and L2-3.  Victory JINNY Gens, MD 06/16/2024, 8:04 AM

## 2024-06-19 ENCOUNTER — Other Ambulatory Visit (HOSPITAL_COMMUNITY): Payer: Self-pay

## 2024-06-20 ENCOUNTER — Other Ambulatory Visit (HOSPITAL_COMMUNITY): Payer: Self-pay

## 2024-06-21 ENCOUNTER — Other Ambulatory Visit (HOSPITAL_COMMUNITY): Payer: Self-pay

## 2024-07-01 ENCOUNTER — Other Ambulatory Visit (HOSPITAL_COMMUNITY): Payer: Self-pay

## 2024-07-01 MED ORDER — CYCLOBENZAPRINE HCL 10 MG PO TABS
10.0000 mg | ORAL_TABLET | Freq: Two times a day (BID) | ORAL | 1 refills | Status: AC | PRN
  Filled 2024-08-15: qty 60, 30d supply, fill #0

## 2024-07-01 MED ORDER — MORPHINE SULFATE 30 MG PO TABS
30.0000 mg | ORAL_TABLET | Freq: Two times a day (BID) | ORAL | 0 refills | Status: DC | PRN
  Filled 2024-07-11: qty 60, 30d supply, fill #0

## 2024-07-03 ENCOUNTER — Other Ambulatory Visit (HOSPITAL_COMMUNITY): Payer: Self-pay

## 2024-07-07 ENCOUNTER — Encounter (HOSPITAL_COMMUNITY): Payer: Self-pay | Admitting: Neurological Surgery

## 2024-07-07 ENCOUNTER — Other Ambulatory Visit: Payer: Self-pay

## 2024-07-07 NOTE — Progress Notes (Signed)
 SDW call  Patient's daughter and DPOA Terry was given pre-op instructions over the phone. She verbalized understanding of instructions provided. She denied patient having SOB, fever, cough or CP    PCP - Milo Costa with [facility] Cardiologist - Dr. Marcello Lennox Pulmonary:  Nephrologist: Dr. Gordy Blanch   PPM/ICD - denies Device Orders - na Rep Notified - na   Chest x-ray - 08/20/2023 EKG -  06/10/2024 Stress Test -06/20/2024 ECHO - 09/19/2023 Cardiac Cath - denies  Sleep Study/sleep apnea/CPAP: denies  Non-diabetic  Blood Thinner Instructions: denies Aspirin  Instructions:denies   ERAS Protcol - Clears until 0430  Anesthesia review: Yes. Isaiah reviewed 06/13/2024. Patient needed stress test prior to surgery  Your procedure is scheduled on Tuesday July 08, 2024  Report to [facility] Main Entrance A at  0530  A.M., then check in with the Admitting office.  Call this number if you have problems the morning of surgery:  [phone]   If you have any questions prior to your surgery date call [phone]: Open Monday-Friday 8am-4pm If you experience any cold or flu symptoms such as cough, fever, chills, shortness of breath, etc. between now and your scheduled surgery, please notify us  at the above number     Remember:  Do not eat after midnight the night before your surgery  You may drink clear liquids until  0430   the morning of your surgery.   Clear liquids allowed are: Water , Non-Citrus Juices (without pulp), Carbonated Beverages, Clear Tea, Black Coffee ONLY (NO MILK, CREAM OR POWDERED CREAMER of any kind), and Gatorade   Take these medicines the morning of surgery with A SIP OF WATER :  Lexapro , galantamine , synthroid , MS contin   As needed: Flexeril , morphine   As of today, STOP taking any Aspirin  (unless otherwise instructed by your surgeon) Aleve, Naproxen, Ibuprofen, Motrin, Advil, Goody's, BC's, all herbal medications, fish oil, and all vitamins.

## 2024-07-07 NOTE — Anesthesia Preprocedure Evaluation (Signed)
 Anesthesia Evaluation  Patient identified by MRN, date of birth, ID band Patient awake    Reviewed: Allergy & Precautions, H&P , NPO status , Patient's Chart, lab work & pertinent test results  History of Anesthesia Complications Negative for: history of anesthetic complications  Airway Mallampati: II  TM Distance: >3 FB Neck ROM: Full    Dental  (+) Upper Dentures   Pulmonary sleep apnea , former smoker Hx of lung cancer   Pulmonary exam normal breath sounds clear to auscultation       Cardiovascular hypertension, + CAD and +CHF  Normal cardiovascular exam Rhythm:Regular Rate:Normal  Nuclear stress test 06/20/2024: IMPRESSION:  No evidence of inducible ischemia. LVEF 73%.  Echo 09/19/2023: IMPRESSIONS  1. Left ventricular ejection fraction, by estimation, is 55 to 60%. The  left ventricle has normal function. The left ventricle has no regional  wall motion abnormalities. Left ventricular diastolic parameters are  consistent with Grade I diastolic  dysfunction (impaired relaxation).  2. Right ventricular systolic function is normal. The right ventricular  size is normal. There is normal pulmonary artery systolic pressure.  3. Left atrial size was mildly dilated.  4. The mitral valve is abnormal. Mild mitral valve regurgitation. No  evidence of mitral stenosis.  5. The aortic valve is tricuspid. There is mild calcification of the  aortic valve. Aortic valve regurgitation is trivial. Aortic valve  sclerosis is present, with no evidence of aortic valve stenosis.  6. The inferior vena cava is normal in size with greater than 50%  respiratory variability, suggesting right atrial pressure of 3 mmHg.     Neuro/Psych  Headaches, Seizures -,  PSYCHIATRIC DISORDERS Anxiety Depression   Dementia    GI/Hepatic negative GI ROS, Neg liver ROS,,,  Endo/Other  Hypothyroidism    Renal/GU Renal disease  negative  genitourinary   Musculoskeletal  (+) Arthritis ,  Fibromyalgia -  Abdominal   Peds negative pediatric ROS (+)  Hematology  (+) Blood dyscrasia, anemia   Anesthesia Other Findings   Reproductive/Obstetrics negative OB ROS                              Anesthesia Physical Anesthesia Plan  ASA: 3  Anesthesia Plan: General   Post-op Pain Management: Ofirmev  IV (intra-op)*   Induction: Intravenous  PONV Risk Score and Plan: 3 and Ondansetron , Dexamethasone  and Treatment may vary due to age or medical condition  Airway Management Planned: Oral ETT  Additional Equipment: None  Intra-op Plan:   Post-operative Plan: Extubation in OR  Informed Consent: I have reviewed the patients History and Physical, chart, labs and discussed the procedure including the risks, benefits and alternatives for the proposed anesthesia with the patient or authorized representative who has indicated his/her understanding and acceptance.     Dental advisory given  Plan Discussed with: CRNA  Anesthesia Plan Comments: (PAT note written 07/07/2024 by Dawna Jakes, PA-C. Rescheduled from August until she completed cardiology evaluation and is now s/p non-ischemic stress test 06/20/2024.  )         Anesthesia Quick Evaluation

## 2024-07-07 NOTE — Progress Notes (Signed)
 Anesthesia Chart Review: SAME DAY WORK-UP  Case: [id number] Date/Time: 07/08/24 0715   Procedure: LUMBAR LAMINECTOMY/DECOMPRESSION MICRODISCECTOMY 2 LEVELS (Back) - Sublaminar decompression - L1-L2 - L2-L3   Anesthesia type: General   Diagnosis: Spinal stenosis, lumbar region, with neurogenic claudication [M48.062]   Pre-op diagnosis: Spinal stenosis lumbar region with neurogenic claudication   [facility] ROOM 20 / [facility]   Surgeons: Colon Shove, MD       DISCUSSION: Patient is a 73 year old female scheduled for the above procedure. Surgery was initially scheduled for 06/16/2024, but it was delayed until she could be re-evaluated by cardiology given incomplete testing from 09/2022 visit. Since then she had a non-ischemic stress test on 06/20/2024 (see below).  History includes former smoker (quit 09/22/1985), pericarditis (s/p pericardial window 2009), CAD (coronary calcifications), chronic diastolic CHF, chronic exertional dyspnea, lung cancer (RUL wedge resection, mediastinal LN dissection 09/28/2017), renal cancer (s/p bilateral partial nephrectomies ~ 2005-2008), CKD (stage III-IV), skin cancer (melanoma excision ~ 2010), right ovarian cancer (s/p hysterectomy/BSO), hypothyroidism, anemia, venous insufficiency (s/p endovascular laser ablation left GSV 04/14/2013), fibromyalgia, colonic stricture (s/p robotic total colectomy with ileorectal anastomosis 07/24/2019), osteoarthritis (left TSA, bilateral TKA, right THA), spinal surgery (L3-sacrum fusion; L5-S1 PLIF 04/10/2011; L2-3 decompression 07/16/2018), dementia.     Last visit with pulmonology was on 08/20/23 with Malachy Crank, NP she was seen for overdue follow-up with worsening exertional dyspnea for about 6 months. Walk test was without desaturations. Etiology of DOE unclear, although deconditioning likely contributing. PFTs, TTE, BNP, CXR, TSH, CBC, BMET included in work-up. BNP elevated at 270, Creatinine 2.30 (known CKD), TSH 1.67, H/H  9.0/28.1 (down fro 11.3/34.4 12/27/22). CXR was concerning for new RUL nodule, but 09/19/2023 chest CT showed no evidence of recurrent or metastatic lung cancer. She did have aortic and coronary calcifications. TTE on 09/19/23 showed LVEF 55-60%, no RWMA, grade 1 DD, normal RV systolic function, normal PASP, mild MR. She was referred to nephrology and also advised to follow-up with cardiology for diastolic dysfunction and elevated BNP. When I evaluated her on 06/10/2024, she reported breathing improved since then and feels her exertional dyspnea is at baseline. She is unable to go up a flight of stairs without DOE. She denied LE edema, syncope, palpitations, SOB at rest, and chest pain. Activity currently more limited due to her back issues that acutely worsening in the last several months, but had been able to do ADLs without any CV issues.      05/01/2024 nephrology visit with Dr. Tobie is scheduled under Media tab.  Her CKD is thought to be multifactorial, but largely from loss of functional renal mass s/p previous bilateral partial nephrectomies. Lab had shown improvement in the previous six months with eGRF ~ 31, up from ~ 22. As of 06/10/2024, BUN 39, Creatinine 1.66, eGFR 32.  Last [facility] cardiology evaluation with Dr. Uvaldo was on 06/13/2024. Previous visit was on 09/28/2022 for dyspnea, HLD, and coronary calcifications on imaging. Nuclear stress test and Holter monitor had been considered at that time, but she did not schedule. She was hesitant to start statin therapy. At her more recent August visit, she did not have any clinical signs of HF. Known chronic DOE. 08/2023 TTE results reviewed. Given overall poor functional status, a nuclear stress test was again recommended and if comes as low risk feature, she does not need further cardiac workup. She still declined statin therapy. 06/20/2024 stress test was non-ischemic, EF 73%.  Anesthesia team to evaluate on the day of  surgery.    VS:  Wt Readings  from Last 3 Encounters:  06/10/24 71.2 kg  01/01/24 65.7 kg  11/08/23 70.8 kg   BP Readings from Last 3 Encounters:  06/10/24 (!) 162/86  05/27/24 (!) 149/82  05/20/24 (!) 160/76   Pulse Readings from Last 3 Encounters:  06/10/24 83  05/27/24 70  05/20/24 71     PROVIDERS: Pura Lenis, MD is listed as PCP. Appears she had a new patient visit with Auston Opal, DO on 03/27/2024. Tobie Heinz, MD is nephrologist Sherrod Sherrod, MD is HEM-ONC Vear Ade, MD is neurologist Arrie Champagne, MD is Geriatric Medicine ([facility]) Shelah Charleston, MD is pulmonologist, although recently she is primarily followed by Malachy Crank, NP Uvaldo Fusi, MD is cardiologist   LABS: 06/10/2024 lab results noted. H/H 10.8/34.3 appear stable. PLT 131. BUN 39, Cr 1.66, eGFR 32--improved since 09/19/23. Lab Results  Component Value Date   WBC 5.9 06/10/2024   HGB 10.8 (L) 06/10/2024   HCT 34.3 (L) 06/10/2024   PLT 131 (L) 06/10/2024   GLUCOSE 78 06/10/2024   ALT 14 09/19/2023   AST 23 09/19/2023   NA 137 06/10/2024   K 4.5 06/10/2024   CL 107 06/10/2024   CREATININE 1.66 (H) 06/10/2024   BUN 39 (H) 06/10/2024   CO2 20 (L) 06/10/2024   TSH 1.67 08/20/2023    IMAGES: MRI L-spine 04/16/2024: IMPRESSION: 1. Slightly worsened severe spinal canal stenosis and severe bilateral neural foraminal stenosis at L1-L2. 2. Unchanged severe spinal canal stenosis at L2-L3. 3. Unchanged moderate left L5-S1 neural foraminal stenosis.   US  Renal 11/01/2023: IMPRESSION: 1. No hydronephrosis. 2. There is a 1.8 cm echogenic mass within the right kidney. 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 Recommend follow-up on short-term renal ultrasound.   CT Chest 09/19/2023: IMPRESSION: 1. Right upper lobe wedge resection. No evidence of recurrent or metastatic disease. 2. Aortic atherosclerosis (ICD10-I70.0). Coronary artery calcification.   MRI C-spine  08/31/23: IMPRESSION: The spinal cord is normal signal.   At C3-C4, there is mild anterolisthesis and other degenerative change causing mild to moderate spinal stenosis and moderate right greater than left foraminal narrowing.  There is no definite nerve root compression, the degenerative changes encroach upon the C4 nerve roots. At C5-C6, there is trace retrolisthesis and other degenerative change causing moderate spinal stenosis and moderate right greater than left foraminal narrowing.  There is no definite nerve root compression though the degenerative changes encroach upon the C6 nerve roots. At C6-C7, there are degenerative changes causing moderate spinal stenosis and moderate left greater than right foraminal narrowing.  There is no nerve root compression. Milder degenerative changes at the other cervical levels as detailed above not leading to spinal stenosis or nerve root compression.   MRI Brain 08/31/23: IMPRESSION: Mild generalized cortical atrophy, slightly more than typical for age and progressed compared to the 2011 MRI. Few scattered T2/FLAIR hyperintense foci in the hemispheres consistent with mild chronic microvascular ischemic change that would be normal for age. No acute findings.     EKG: EKG 06/13/2024 ([facility]): Per Narrative in CE: Sinus Rhythm  Low voltage in precordial leads.   EKG 06/10/2024: Normal sinus rhythm with sinus arrhythmia Cannot rule out Anterior infarct , age undetermined Abnormal ECG   CV: Nuclear stress test 06/20/2024: IMPRESSION:  No evidence of inducible ischemia. LVEF 73%.  Echo 09/19/2023: IMPRESSIONS   1. Left ventricular ejection fraction, by estimation, is 55 to 60%. The  left ventricle has normal function.  The left ventricle has no regional  wall motion abnormalities. Left ventricular diastolic parameters are  consistent with Grade I diastolic  dysfunction (impaired relaxation).   2. Right ventricular systolic function is normal. The  right ventricular  size is normal. There is normal pulmonary artery systolic pressure.   3. Left atrial size was mildly dilated.   4. The mitral valve is abnormal. Mild mitral valve regurgitation. No  evidence of mitral stenosis.   5. The aortic valve is tricuspid. There is mild calcification of the  aortic valve. Aortic valve regurgitation is trivial. Aortic valve  sclerosis is present, with no evidence of aortic valve stenosis.   6. The inferior vena cava is normal in size with greater than 50%  respiratory variability, suggesting right atrial pressure of 3 mmHg.    Past Medical History:  Diagnosis Date   Anemia    Anxiety    Arthritis    everywhere (08/19/2013)   Bilateral carpal tunnel syndrome    Chest pain at rest, rule out pericarditis 11/27/2012   CHF (congestive heart failure) (HCC)    Chronic kidney disease    dr alline   Chronic lower back pain    Closed displaced fracture of right femoral neck (HCC) 02/18/2019   Complication of anesthesia    woke up twice during knee replacement (08/19/2013)   Coronary artery disease    Dementia (HCC)    Depression    Dyspnea    w/ exertion     Fibromyalgia    Headache(784.0)    hx of migraines. none for a long time   History of pericarditis, with pericardial window in 2009 11/27/2012   Hx of ovarian cancer 11/27/2012   Hypothyroidism 11/27/2012   Iron deficiency anemia    Kidney carcinoma (HCC)    both kidneys; ~ 3 yr apart (08/19/2013)   Lung cancer (HCC) 2018   Melanoma of back (HCC)    Migraines    Pneumonia    several times; including today, RUL/notes 08/19/2013   Squamous carcinoma    face, side of my nose, chest; used acid to get rid of them (08/19/2013)   Venous insufficiency 11/27/2012    Past Surgical History:  Procedure Laterality Date   ABDOMINAL HYSTERECTOMY  1979   ANTERIOR LAT LUMBAR FUSION Left 07/16/2018   Procedure: Left Lumbar two-three  Anterolateral decompression/interbody fusion with  lateral plate fixation;  Surgeon: Colon Shove, MD;  Location: [facility];  Service: Neurosurgery;  Laterality: Left;   APPENDECTOMY     COLON RESECTION  07/2019   DOPPLER ECHOCARDIOGRAPHY  02/23/2011   LVEF>50%   FRACTURE SURGERY     KNEE ARTHROSCOPY Right    twice before the replacement (08/19/2013)   LEFT OOPHORECTOMY Left 1979   LUMBAR DISC SURGERY  1984   ruptured disc   MELANOMA EXCISION  ~ 2010   my lower back (08/19/2013)   ORIF HIP FRACTURE Left    x 2 age 21 and 4   PARTIAL NEPHRECTOMY Right 2005; ~ 2008   PERICARDIAL WINDOW  ~ 2012   POSTERIOR LUMBAR FUSION  2002?; 03/2012   REDUCTION MAMMAPLASTY Bilateral ~ 2008   RIGHT OOPHORECTOMY  ~ 2007   it had cancer in it (08/19/2013)   SHOULDER ARTHROSCOPY W/ ROTATOR CUFF REPAIR Right 1990's   TONSILLECTOMY  1954   TOTAL HIP ARTHROPLASTY Right 02/18/2019   Procedure: TOTAL HIP ARTHROPLASTY;  Surgeon: Josefina Chew, MD;  Location: [facility];  Service: Orthopedics;  Laterality: Right;   TOTAL HIP  REVISION Right 03/27/2019   Procedure: TOTAL HIP REVISION;  Surgeon: Ernie Cough, MD;  Location: [facility]S;  Service: Orthopedics;  Laterality: Right;   TOTAL HIP REVISION Right 08/12/2020   Procedure: RIGHT TOTAL HIP REVISION TO CONSTRAINED LINER;  Surgeon: Ernie Cough, MD;  Location: [facility];  Service: Orthopedics;  Laterality: Right;  90 mins   TOTAL KNEE ARTHROPLASTY Right 1990's   TOTAL KNEE ARTHROPLASTY Left 08/02/2015   Procedure: LEFT TOTAL KNEE ARTHROPLASTY;  Surgeon: Cough Ernie, MD;  Location: [facility];  Service: Orthopedics;  Laterality: Left;   TOTAL SHOULDER REPLACEMENT Left 2006?   TUBAL LIGATION     VIDEO ASSISTED THORACOSCOPY (VATS)/WEDGE RESECTION Right 09/28/2017   Procedure: RIGHT VIDEO ASSISTED THORACOSCOPY (VATS)/WEDGE RESECTION, LYMPH NODE DISSECTION ;  Surgeon: Kerrin Elspeth BROCKS, MD;  Location: [facility];  Service: Thoracic;  Laterality: Right;    MEDICATIONS: No current facility-administered medications for this  encounter.    acetaminophen  (TYLENOL ) 500 MG tablet   albuterol  (VENTOLIN  HFA) 108 (90 Base) MCG/ACT inhaler   buPROPion  (WELLBUTRIN  SR) 150 MG 12 hr tablet   cyclobenzaprine  (FLEXERIL ) 10 MG tablet   doxepin  (SINEQUAN ) 10 MG capsule   escitalopram  (LEXAPRO ) 10 MG tablet   ferrous sulfate  (FERROUSUL) 325 (65 FE) MG tablet   furosemide  (LASIX ) 20 MG tablet   galantamine  (RAZADYNE ) 4 MG tablet   galantamine  (RAZADYNE ) 8 MG tablet   levothyroxine  (SYNTHROID ) 100 MCG tablet   MAGNESIUM  PO   methocarbamol  (ROBAXIN ) 500 MG tablet   morphine  (MS CONTIN ) 30 MG 12 hr tablet   morphine  (MSIR) 30 MG tablet   morphine  (MSIR) 30 MG tablet   morphine  (MSIR) 30 MG tablet   morphine  (MSIR) 30 MG tablet   [START ON 07/11/2024] morphine  (MSIR) 30 MG tablet    Isaiah Ruder, PA-C Surgical Short Stay/Anesthesiology [facility] Phone [phone] [facility] Phone [phone] 07/07/2024 10:11 AM

## 2024-07-08 ENCOUNTER — Encounter (HOSPITAL_COMMUNITY)
Admission: RE | Disposition: A | Discharge status: Home / Self Care | Payer: Self-pay | Source: Home / Self Care | Attending: Neurological Surgery

## 2024-07-08 ENCOUNTER — Encounter (HOSPITAL_COMMUNITY): Payer: Self-pay | Admitting: Vascular Surgery

## 2024-07-08 ENCOUNTER — Ambulatory Visit (HOSPITAL_COMMUNITY): Admitting: Vascular Surgery

## 2024-07-08 ENCOUNTER — Other Ambulatory Visit: Payer: Self-pay

## 2024-07-08 ENCOUNTER — Other Ambulatory Visit (HOSPITAL_COMMUNITY): Payer: Self-pay

## 2024-07-08 ENCOUNTER — Encounter (HOSPITAL_COMMUNITY): Admission: RE | Payer: Self-pay | Source: Home / Self Care

## 2024-07-08 ENCOUNTER — Encounter (HOSPITAL_COMMUNITY): Payer: Self-pay | Admitting: Neurological Surgery

## 2024-07-08 ENCOUNTER — Ambulatory Visit (HOSPITAL_COMMUNITY)

## 2024-07-08 ENCOUNTER — Observation Stay (HOSPITAL_COMMUNITY)
Admission: RE | Admit: 2024-07-08 | Discharge: 2024-07-09 | Disposition: A | Discharge status: Home / Self Care | Attending: Neurological Surgery | Admitting: Neurological Surgery

## 2024-07-08 ENCOUNTER — Ambulatory Visit (HOSPITAL_BASED_OUTPATIENT_CLINIC_OR_DEPARTMENT_OTHER): Admitting: Vascular Surgery

## 2024-07-08 DIAGNOSIS — N189 Chronic kidney disease, unspecified: Secondary | ICD-10-CM | POA: Insufficient documentation

## 2024-07-08 DIAGNOSIS — Z981 Arthrodesis status: Secondary | ICD-10-CM | POA: Insufficient documentation

## 2024-07-08 DIAGNOSIS — I509 Heart failure, unspecified: Secondary | ICD-10-CM | POA: Insufficient documentation

## 2024-07-08 DIAGNOSIS — I2510 Atherosclerotic heart disease of native coronary artery without angina pectoris: Secondary | ICD-10-CM

## 2024-07-08 DIAGNOSIS — M4726 Other spondylosis with radiculopathy, lumbar region: Secondary | ICD-10-CM | POA: Diagnosis not present

## 2024-07-08 DIAGNOSIS — I130 Hypertensive heart and chronic kidney disease with heart failure and stage 1 through stage 4 chronic kidney disease, or unspecified chronic kidney disease: Secondary | ICD-10-CM | POA: Diagnosis not present

## 2024-07-08 DIAGNOSIS — I10 Essential (primary) hypertension: Secondary | ICD-10-CM

## 2024-07-08 DIAGNOSIS — M48062 Spinal stenosis, lumbar region with neurogenic claudication: Secondary | ICD-10-CM

## 2024-07-08 DIAGNOSIS — Z87891 Personal history of nicotine dependence: Secondary | ICD-10-CM | POA: Diagnosis not present

## 2024-07-08 DIAGNOSIS — E039 Hypothyroidism, unspecified: Secondary | ICD-10-CM | POA: Insufficient documentation

## 2024-07-08 DIAGNOSIS — M79661 Pain in right lower leg: Secondary | ICD-10-CM | POA: Diagnosis present

## 2024-07-08 HISTORY — PX: LUMBAR LAMINECTOMY/DECOMPRESSION MICRODISCECTOMY: SHX5026

## 2024-07-08 SURGERY — LUMBAR LAMINECTOMY/DECOMPRESSION MICRODISCECTOMY 2 LEVELS
Anesthesia: General | Site: Back

## 2024-07-08 MED ORDER — OXYCODONE HCL 5 MG PO TABS
5.0000 mg | ORAL_TABLET | Freq: Once | ORAL | Status: AC | PRN
  Administered 2024-07-08: 5 mg via ORAL

## 2024-07-08 MED ORDER — PHENYLEPHRINE 80 MCG/ML (10ML) SYRINGE FOR IV PUSH (FOR BLOOD PRESSURE SUPPORT)
PREFILLED_SYRINGE | INTRAVENOUS | Status: DC | PRN
Start: 2024-07-08 — End: 2024-07-08
  Administered 2024-07-08: 80 ug via INTRAVENOUS

## 2024-07-08 MED ORDER — POLYETHYLENE GLYCOL 3350 17 G PO PACK: 17.0000 g | PACK | Freq: Every day | ORAL | Status: DC | PRN

## 2024-07-08 MED ORDER — LEVOTHYROXINE SODIUM 100 MCG PO TABS
100.0000 ug | ORAL_TABLET | Freq: Every day | ORAL | Status: DC
  Administered 2024-07-09: 100 ug via ORAL
  Filled 2024-07-08: qty 1

## 2024-07-08 MED ORDER — BISACODYL 10 MG RE SUPP: 10.0000 mg | Freq: Every day | RECTAL | Status: DC | PRN

## 2024-07-08 MED ORDER — PHENYLEPHRINE 80 MCG/ML (10ML) SYRINGE FOR IV PUSH (FOR BLOOD PRESSURE SUPPORT)
PREFILLED_SYRINGE | INTRAVENOUS | Status: AC
  Filled 2024-07-08: qty 10

## 2024-07-08 MED ORDER — PHENYLEPHRINE HCL-NACL 20-0.9 MG/250ML-% IV SOLN
INTRAVENOUS | Status: DC | PRN
Start: 2024-07-08 — End: 2024-07-08
  Administered 2024-07-08: 20 ug/min via INTRAVENOUS

## 2024-07-08 MED ORDER — ROCURONIUM BROMIDE 10 MG/ML (PF) SYRINGE
PREFILLED_SYRINGE | INTRAVENOUS | Status: AC
  Filled 2024-07-08: qty 10

## 2024-07-08 MED ORDER — 0.9 % SODIUM CHLORIDE (POUR BTL) OPTIME
TOPICAL | Status: DC | PRN
  Administered 2024-07-08: 1000 mL

## 2024-07-08 MED ORDER — ACETAMINOPHEN 325 MG PO TABS
650.0000 mg | ORAL_TABLET | ORAL | Status: DC | PRN
  Administered 2024-07-09: 650 mg via ORAL
  Filled 2024-07-08: qty 2

## 2024-07-08 MED ORDER — SODIUM CHLORIDE 0.9% FLUSH
3.0000 mL | Freq: Two times a day (BID) | INTRAVENOUS | Status: DC
  Administered 2024-07-08 (×2): 3 mL via INTRAVENOUS

## 2024-07-08 MED ORDER — ROCURONIUM BROMIDE 10 MG/ML (PF) SYRINGE
PREFILLED_SYRINGE | INTRAVENOUS | Status: DC | PRN
  Administered 2024-07-08: 40 mg via INTRAVENOUS
  Administered 2024-07-08 (×3): 20 mg via INTRAVENOUS

## 2024-07-08 MED ORDER — THROMBIN 5000 UNITS EX KIT
PACK | CUTANEOUS | Status: AC
  Filled 2024-07-08: qty 1

## 2024-07-08 MED ORDER — ALUM & MAG HYDROXIDE-SIMETH 200-200-20 MG/5ML PO SUSP: 30.0000 mL | Freq: Four times a day (QID) | ORAL | Status: DC | PRN

## 2024-07-08 MED ORDER — CHLORHEXIDINE GLUCONATE 4 % EX SOLN
1.0000 | CUTANEOUS | 1 refills | Status: AC
  Filled 2024-07-08: qty 946, 30d supply, fill #0
  Filled 2024-07-31 – 2024-09-10 (×6): qty 946, 30d supply, fill #1

## 2024-07-08 MED ORDER — DEXAMETHASONE SODIUM PHOSPHATE 10 MG/ML IJ SOLN
INTRAMUSCULAR | Status: AC
  Filled 2024-07-08: qty 1

## 2024-07-08 MED ORDER — DROPERIDOL 2.5 MG/ML IJ SOLN
0.6250 mg | Freq: Once | INTRAMUSCULAR | Status: AC | PRN
  Administered 2024-07-08: 0.625 mg via INTRAVENOUS

## 2024-07-08 MED ORDER — FENTANYL CITRATE (PF) 100 MCG/2ML IJ SOLN
INTRAMUSCULAR | Status: AC
  Filled 2024-07-08: qty 2

## 2024-07-08 MED ORDER — LIDOCAINE 2% (20 MG/ML) 5 ML SYRINGE
INTRAMUSCULAR | Status: AC
Start: 2024-07-08 — End: 2024-07-08
  Filled 2024-07-08: qty 5

## 2024-07-08 MED ORDER — THROMBIN 5000 UNITS EX SOLR: OROMUCOSAL | Status: DC | PRN

## 2024-07-08 MED ORDER — PROPOFOL 10 MG/ML IV BOLUS
INTRAVENOUS | Status: DC | PRN
  Administered 2024-07-08: 100 mg via INTRAVENOUS

## 2024-07-08 MED ORDER — SUGAMMADEX SODIUM 200 MG/2ML IV SOLN
INTRAVENOUS | Status: DC | PRN
  Administered 2024-07-08: 200 mg via INTRAVENOUS

## 2024-07-08 MED ORDER — SODIUM CHLORIDE 0.9% FLUSH: 3.0000 mL | INTRAVENOUS | Status: DC | PRN

## 2024-07-08 MED ORDER — GALANTAMINE HYDROBROMIDE 4 MG PO TABS
8.0000 mg | ORAL_TABLET | Freq: Two times a day (BID) | ORAL | Status: DC
  Administered 2024-07-08 – 2024-07-09 (×2): 8 mg via ORAL
  Filled 2024-07-08 (×3): qty 2

## 2024-07-08 MED ORDER — FERROUS SULFATE 325 (65 FE) MG PO TABS
325.0000 mg | ORAL_TABLET | Freq: Every day | ORAL | Status: DC
  Administered 2024-07-09: 325 mg via ORAL
  Filled 2024-07-08: qty 1

## 2024-07-08 MED ORDER — SENNA 8.6 MG PO TABS
1.0000 | ORAL_TABLET | Freq: Two times a day (BID) | ORAL | Status: DC
  Administered 2024-07-08: 8.6 mg via ORAL
  Filled 2024-07-08 (×3): qty 1

## 2024-07-08 MED ORDER — DOCUSATE SODIUM 100 MG PO CAPS
100.0000 mg | ORAL_CAPSULE | Freq: Two times a day (BID) | ORAL | Status: DC
  Administered 2024-07-08: 100 mg via ORAL
  Filled 2024-07-08 (×3): qty 1

## 2024-07-08 MED ORDER — ONDANSETRON HCL 4 MG/2ML IJ SOLN
INTRAMUSCULAR | Status: DC | PRN
  Administered 2024-07-08: 4 mg via INTRAVENOUS

## 2024-07-08 MED ORDER — ACETAMINOPHEN 10 MG/ML IV SOLN: 1000.0000 mg | Freq: Once | INTRAVENOUS | Status: DC | PRN

## 2024-07-08 MED ORDER — MENTHOL 3 MG MT LOZG: 1.0000 | LOZENGE | OROMUCOSAL | Status: DC | PRN

## 2024-07-08 MED ORDER — CHLORHEXIDINE GLUCONATE CLOTH 2 % EX PADS: 6.0000 | MEDICATED_PAD | Freq: Once | CUTANEOUS | Status: DC

## 2024-07-08 MED ORDER — MAGNESIUM 200 MG PO TABS: ORAL_TABLET | Freq: Every day | ORAL | Status: DC

## 2024-07-08 MED ORDER — DROPERIDOL 2.5 MG/ML IJ SOLN
INTRAMUSCULAR | Status: AC
  Filled 2024-07-08: qty 2

## 2024-07-08 MED ORDER — MORPHINE SULFATE 15 MG PO TABS
30.0000 mg | ORAL_TABLET | Freq: Two times a day (BID) | ORAL | Status: DC | PRN
  Administered 2024-07-08 – 2024-07-09 (×2): 30 mg via ORAL
  Filled 2024-07-08 (×2): qty 2

## 2024-07-08 MED ORDER — PHENOL 1.4 % MT LIQD: 1.0000 | OROMUCOSAL | Status: DC | PRN

## 2024-07-08 MED ORDER — ONDANSETRON HCL 4 MG/2ML IJ SOLN
INTRAMUSCULAR | Status: AC
  Filled 2024-07-08: qty 2

## 2024-07-08 MED ORDER — MIDAZOLAM HCL 2 MG/2ML IJ SOLN
INTRAMUSCULAR | Status: AC
  Filled 2024-07-08: qty 2

## 2024-07-08 MED ORDER — LIDOCAINE HCL (CARDIAC) PF 100 MG/5ML IV SOSY
PREFILLED_SYRINGE | INTRAVENOUS | Status: DC | PRN
  Administered 2024-07-08: 60 mg via INTRAVENOUS

## 2024-07-08 MED ORDER — MUPIROCIN 2 % EX OINT
1.0000 | TOPICAL_OINTMENT | Freq: Two times a day (BID) | CUTANEOUS | 0 refills | Status: AC
  Filled 2024-07-08: qty 22, 11d supply, fill #0
  Filled 2024-07-12: qty 22, 11d supply, fill #1

## 2024-07-08 MED ORDER — ESCITALOPRAM OXALATE 10 MG PO TABS
10.0000 mg | ORAL_TABLET | Freq: Every day | ORAL | Status: DC
  Administered 2024-07-09: 10 mg via ORAL
  Filled 2024-07-08: qty 1

## 2024-07-08 MED ORDER — MORPHINE SULFATE (PF) 4 MG/ML IV SOLN
4.0000 mg | INTRAVENOUS | Status: DC | PRN
  Administered 2024-07-08 – 2024-07-09 (×3): 4 mg via INTRAVENOUS
  Filled 2024-07-08 (×3): qty 1

## 2024-07-08 MED ORDER — FUROSEMIDE 20 MG PO TABS: 20.0000 mg | ORAL_TABLET | Freq: Every day | ORAL | Status: DC

## 2024-07-08 MED ORDER — ORAL CARE MOUTH RINSE: 15.0000 mL | Freq: Once | OROMUCOSAL | Status: AC

## 2024-07-08 MED ORDER — LIDOCAINE-EPINEPHRINE 1 %-1:100000 IJ SOLN
INTRAMUSCULAR | Status: AC
  Filled 2024-07-08: qty 1

## 2024-07-08 MED ORDER — CEFAZOLIN SODIUM-DEXTROSE 2-4 GM/100ML-% IV SOLN
2.0000 g | INTRAVENOUS | Status: AC
  Administered 2024-07-08: 2 g via INTRAVENOUS
  Filled 2024-07-08: qty 100

## 2024-07-08 MED ORDER — LIDOCAINE-EPINEPHRINE (PF) 1 %-1:200000 IJ SOLN
INTRAMUSCULAR | Status: DC | PRN
  Administered 2024-07-08: 10 mL via SURGICAL_CAVITY

## 2024-07-08 MED ORDER — METHOCARBAMOL 500 MG PO TABS
500.0000 mg | ORAL_TABLET | Freq: Four times a day (QID) | ORAL | Status: DC | PRN
  Administered 2024-07-08 – 2024-07-09 (×3): 500 mg via ORAL
  Filled 2024-07-08 (×3): qty 1

## 2024-07-08 MED ORDER — ACETAMINOPHEN 10 MG/ML IV SOLN
INTRAVENOUS | Status: DC | PRN
  Administered 2024-07-08: 1000 mg via INTRAVENOUS

## 2024-07-08 MED ORDER — CEFAZOLIN SODIUM-DEXTROSE 2-4 GM/100ML-% IV SOLN
2.0000 g | Freq: Three times a day (TID) | INTRAVENOUS | Status: AC
  Administered 2024-07-08 (×2): 2 g via INTRAVENOUS
  Filled 2024-07-08 (×2): qty 100

## 2024-07-08 MED ORDER — LACTATED RINGERS IV SOLN: INTRAVENOUS | Status: DC

## 2024-07-08 MED ORDER — BUPIVACAINE HCL 0.5 % IJ SOLN
INTRAMUSCULAR | Status: DC | PRN
  Administered 2024-07-08: 20 mL

## 2024-07-08 MED ORDER — OXYCODONE HCL 5 MG/5ML PO SOLN: 5.0000 mg | Freq: Once | ORAL | Status: AC | PRN

## 2024-07-08 MED ORDER — DEXAMETHASONE SODIUM PHOSPHATE 10 MG/ML IJ SOLN
INTRAMUSCULAR | Status: DC | PRN
  Administered 2024-07-08: 8 mg via INTRAVENOUS

## 2024-07-08 MED ORDER — ACETAMINOPHEN 650 MG RE SUPP: 650.0000 mg | RECTAL | Status: DC | PRN

## 2024-07-08 MED ORDER — METHOCARBAMOL 1000 MG/10ML IJ SOLN: 500.0000 mg | Freq: Four times a day (QID) | INTRAMUSCULAR | Status: DC | PRN

## 2024-07-08 MED ORDER — FENTANYL CITRATE (PF) 250 MCG/5ML IJ SOLN
INTRAMUSCULAR | Status: AC
  Filled 2024-07-08: qty 5

## 2024-07-08 MED ORDER — FENTANYL CITRATE (PF) 100 MCG/2ML IJ SOLN
25.0000 ug | INTRAMUSCULAR | Status: DC | PRN
  Administered 2024-07-08 (×2): 50 ug via INTRAVENOUS
  Administered 2024-07-08 (×2): 25 ug via INTRAVENOUS

## 2024-07-08 MED ORDER — FENTANYL CITRATE (PF) 250 MCG/5ML IJ SOLN
INTRAMUSCULAR | Status: DC | PRN
  Administered 2024-07-08: 50 ug via INTRAVENOUS
  Administered 2024-07-08 (×2): 75 ug via INTRAVENOUS
  Administered 2024-07-08: 50 ug via INTRAVENOUS

## 2024-07-08 MED ORDER — DOXEPIN HCL 10 MG PO CAPS: 10.0000 mg | ORAL_CAPSULE | Freq: Every day | ORAL | Status: DC

## 2024-07-08 MED ORDER — BUPROPION HCL ER (SR) 150 MG PO TB12: 150.0000 mg | ORAL_TABLET | Freq: Every day | ORAL | Status: DC

## 2024-07-08 MED ORDER — PROPOFOL 10 MG/ML IV BOLUS
INTRAVENOUS | Status: AC
  Filled 2024-07-08: qty 20

## 2024-07-08 MED ORDER — SODIUM CHLORIDE 0.9 % IV SOLN
250.0000 mL | INTRAVENOUS | Status: AC
  Administered 2024-07-08: 250 mL via INTRAVENOUS

## 2024-07-08 MED ORDER — MORPHINE SULFATE ER 30 MG PO TBCR: 30.0000 mg | EXTENDED_RELEASE_TABLET | Freq: Two times a day (BID) | ORAL | Status: DC

## 2024-07-08 MED ORDER — ALBUMIN HUMAN 5 % IV SOLN: INTRAVENOUS | Status: DC | PRN

## 2024-07-08 MED ORDER — FLEET ENEMA RE ENEM: 1.0000 | ENEMA | Freq: Once | RECTAL | Status: DC | PRN

## 2024-07-08 MED ORDER — OXYCODONE HCL 5 MG PO TABS
ORAL_TABLET | ORAL | Status: AC
  Filled 2024-07-08: qty 1

## 2024-07-08 MED ORDER — ALBUTEROL SULFATE (2.5 MG/3ML) 0.083% IN NEBU: 2.5000 mg | INHALATION_SOLUTION | Freq: Four times a day (QID) | RESPIRATORY_TRACT | Status: DC | PRN

## 2024-07-08 MED ORDER — ONDANSETRON HCL 4 MG/2ML IJ SOLN: 4.0000 mg | Freq: Four times a day (QID) | INTRAMUSCULAR | Status: DC | PRN

## 2024-07-08 MED ORDER — CHLORHEXIDINE GLUCONATE 0.12 % MT SOLN
15.0000 mL | Freq: Once | OROMUCOSAL | Status: AC
  Administered 2024-07-08: 15 mL via OROMUCOSAL
  Filled 2024-07-08: qty 15

## 2024-07-08 MED ORDER — ONDANSETRON HCL 4 MG PO TABS: 4.0000 mg | ORAL_TABLET | Freq: Four times a day (QID) | ORAL | Status: DC | PRN

## 2024-07-08 MED ORDER — BUPIVACAINE HCL (PF) 0.5 % IJ SOLN
INTRAMUSCULAR | Status: AC
  Filled 2024-07-08: qty 30

## 2024-07-08 SURGICAL SUPPLY — 41 items
BAG COUNTER SPONGE SURGICOUNT (BAG) ×1 IMPLANT
BAND RUBBER #18 3X1/16 STRL (MISCELLANEOUS) ×2 IMPLANT
BLADE CLIPPER SURG (BLADE) IMPLANT
BUR ACORN 6.0 (BURR) IMPLANT
BUR MATCHSTICK NEURO 3.0 LAGG (BURR) ×1 IMPLANT
CANISTER SUCTION 3000ML PPV (SUCTIONS) ×1 IMPLANT
DERMABOND ADVANCED .7 DNX12 (GAUZE/BANDAGES/DRESSINGS) ×1 IMPLANT
DEVICE DISSECT PLASMABLAD 3.0S (MISCELLANEOUS) ×1 IMPLANT
DRAPE HALF SHEET 40X57 (DRAPES) IMPLANT
DRAPE LAPAROTOMY 100X72X124 (DRAPES) ×1 IMPLANT
DRAPE MICROSCOPE SLANT 54X150 (MISCELLANEOUS) IMPLANT
DURAPREP 26ML APPLICATOR (WOUND CARE) ×1 IMPLANT
ELECTRODE REM PT RTRN 9FT ADLT (ELECTROSURGICAL) ×1 IMPLANT
GAUZE 4X4 16PLY ~~LOC~~+RFID DBL (SPONGE) IMPLANT
GAUZE SPONGE 4X4 12PLY STRL (GAUZE/BANDAGES/DRESSINGS) ×1 IMPLANT
GLOVE BIOGEL PI IND STRL 8.5 (GLOVE) ×1 IMPLANT
GLOVE ECLIPSE 8.5 STRL (GLOVE) ×1 IMPLANT
GOWN STRL REUS W/ TWL LRG LVL3 (GOWN DISPOSABLE) IMPLANT
GOWN STRL REUS W/ TWL XL LVL3 (GOWN DISPOSABLE) IMPLANT
GOWN STRL REUS W/TWL 2XL LVL3 (GOWN DISPOSABLE) ×1 IMPLANT
HEMOSTAT POWDER KIT SURGIFOAM (HEMOSTASIS) ×1 IMPLANT
KIT BASIN OR (CUSTOM PROCEDURE TRAY) ×1 IMPLANT
KIT TURNOVER KIT B (KITS) ×1 IMPLANT
NDL HYPO 22X1.5 SAFETY MO (MISCELLANEOUS) ×1 IMPLANT
NDL SPNL 20GX3.5 QUINCKE YW (NEEDLE) IMPLANT
NEEDLE HYPO 22X1.5 SAFETY MO (MISCELLANEOUS) ×1 IMPLANT
NEEDLE SPNL 20GX3.5 QUINCKE YW (NEEDLE) IMPLANT
NS IRRIG 1000ML POUR BTL (IV SOLUTION) ×1 IMPLANT
PACK LAMINECTOMY NEURO (CUSTOM PROCEDURE TRAY) ×1 IMPLANT
PAD ARMBOARD POSITIONER FOAM (MISCELLANEOUS) ×3 IMPLANT
PATTIES SURGICAL .5 X1 (DISPOSABLE) ×1 IMPLANT
SPIKE FLUID TRANSFER (MISCELLANEOUS) ×1 IMPLANT
SPONGE SURGIFOAM ABS GEL SZ50 (HEMOSTASIS) IMPLANT
SUT VIC AB 1 CT1 18XBRD ANBCTR (SUTURE) ×1 IMPLANT
SUT VIC AB 2-0 CP2 18 (SUTURE) ×1 IMPLANT
SUT VIC AB 3-0 SH 8-18 (SUTURE) ×1 IMPLANT
SUT VIC AB 4-0 RB1 18 (SUTURE) ×1 IMPLANT
TOWEL GREEN STERILE (TOWEL DISPOSABLE) ×1 IMPLANT
TOWEL GREEN STERILE FF (TOWEL DISPOSABLE) ×1 IMPLANT
TUBING FEATHERFLOW (TUBING) ×1 IMPLANT
WATER STERILE IRR 1000ML POUR (IV SOLUTION) ×1 IMPLANT

## 2024-07-08 NOTE — Transfer of Care (Signed)
 Immediate Anesthesia Transfer of Care Note  Patient: [name]  Procedure(s) Performed: LUMBAR LAMINECTOMY/DECOMPRESSION MICRODISCECTOMY 2 LEVELS (Back)  Patient Location: PACU  Anesthesia Type:General  Level of Consciousness: awake, alert , oriented, drowsy, and patient cooperative  Airway & Oxygen Therapy: Patient Spontanous Breathing and Patient connected to nasal cannula oxygen  Post-op Assessment: Report given to RN and Post -op Vital signs reviewed and stable  Post vital signs: Reviewed and stable  Last Vitals:  Vitals Value Taken Time  BP 152/78 07/08/24 10:07  Temp    Pulse 83 07/08/24 10:09  Resp 13 07/08/24 10:09  SpO2 96 % 07/08/24 10:09  Vitals shown include unfiled device data.  Last Pain:  Vitals:   07/08/24 0626  TempSrc:   PainSc: 7       Patients Stated Pain Goal: 4 (07/08/24 0626)  Complications: No notable events documented.

## 2024-07-08 NOTE — Plan of Care (Signed)

## 2024-07-08 NOTE — Op Note (Signed)
 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  Neurogenic claudication.  Lumbar radiculopathy.  History of fusion L3 to sacrum. Postoperative diagnosis: Same Procedure: Laminectomy and sublaminar decompression L2-3 and L1-2. Surgeon: Victory Gens Anesthesia: General endotracheal Indications: Clara Herbison is a 74 year old individual whose had significant back and bilateral lower extremity pain.  She has undergone previous decompressions and fusions from L3 to the sacrum over the years and had increasing back pain and leg pain develop over the past couple of years she is initially treated well and responded to epidural steroid injections but these have become refractory.  A recent MRI shows high-grade stenosis at L1-2 and L2-3.  Procedure: The patient was brought to the operating room supine on the stretcher.  After the smooth induction of general endotracheal anesthesia, she was carefully turned prone.  The back was prepped with alcohol DuraPrep and draped in a sterile fashion.  Localizing x-ray was obtained to identify the L2-3 interspace.  The skin above this area was infiltrated with lidocaine  1% with 1-100,000 epinephrine  mixed 50-50 with half percent Marcaine .  10 cc was used.  Incision was created and carried down to the lumbodorsal fascia.  The fascia was opened on either side of midline to expose the spinous process and the interlaminar space at L2-3 and L1-2.  Dissection was then carried out bilaterally at L2-3 but on the right side only at L2-3 and L1-2.  Second localizing radiographs were obtained the lamina were significantly bunched together in this region and the laminotomy was started at what was presumed to be L2-3.  The dissection went slowly as there was a severe stenosis in this region gradually the dura was identified was carefully dissected and substantial thickened redundant ligamentous material was removed from this region allowing for good  decompression.  By palpation identified what I presume to be the interlaminar space at L4 1 to this however turned out to be T12-L1 a small laminotomy was created in this region and no thickened redundant ligamentous material was encountered by palpating carefully I noticed that the L1 lamina had severely encroached over the L2 lamina and there was a small space between them this was then enlarged with a laminotomy being created here identified severe thickened redundant yellow ligament and thickened bone consistent with the stenosis.  This area was decompressed.  The sublaminar decompression was performed using 2 mm Kerrison punch and a curved Kerrison punch to remove redundant ligamentous material common dural tube could be decompressed far to the opposite side and no laminotomy was created on the left side at either L1-2 or L2-3.  Once an adequate decompression was noted and sounded out the common dural tube and the takeoff of the individual nerve roots could be surrounded freely into the foramen we then obtained hemostasis with some Surgifoam and thrombin .  The bony edges were checked for hemostasis.  The retractors were removed and 25 cc of half percent Marcaine  was injected into the paraspinous fascia in this region.  Lumbodorsal fascia was then reapproximated with #1 Vicryl in interrupted fashion 2-0 Vicryl was used in the subcutaneous tissues and 3-0 Vicryl subcuticularly along with 4-0 Vicryl for the final subcuticular closure Dermabond was placed on the skin blood loss for this procedure was estimated 400 cc copious irrigation was performed during the procedure also and the actual blood loss in my estimation would be approximately 300 cc.  Patient tolerated procedure well was returned to recovery room stable condition.

## 2024-07-08 NOTE — Progress Notes (Signed)
 Patient ID: [name], female   DOB: 03-12-1951, 73 y.o.   MRN: [id number] Alert and awake and doing well overall.  Legs feel better back is sore from surgery.  Hopeful for discharge in the morning

## 2024-07-08 NOTE — H&P (Signed)
 [name] is an 73 y.o. female.   Chief Complaint: Back and bilateral leg pain, difficulty walking HPI: [name] is a 74 year old individual's been a longstanding patient of practice.  She initially had some degenerative changes that require decompression and fusion secondary to spondylolisthesis at L3-4 and L4-5 she subsequently developed further problems at L5-S1 last several years she has been complaining of increasing back pain and difficulty walking and recent MRI demonstrates that she has evolved and developed severe stenosis at L1-2 and L2-3 secondary to a combination of disc degeneration and facet hypertrophy.  She is advised regarding the need for surgery to decompress the L1-2 and L2-3 levels and she is now admitted for this.  Past Medical History:  Diagnosis Date   Anemia    Anxiety    Arthritis    everywhere (08/19/2013)   Bilateral carpal tunnel syndrome    Chest pain at rest, rule out pericarditis 11/27/2012   CHF (congestive heart failure) (HCC)    Chronic kidney disease    dr alline   Chronic lower back pain    Closed displaced fracture of right femoral neck (HCC) 02/18/2019   Complication of anesthesia    woke up twice during knee replacement (08/19/2013)   Coronary artery disease    Dementia (HCC)    Depression    Dyspnea    w/ exertion     Fibromyalgia    Headache(784.0)    hx of migraines. none for a long time   History of pericarditis, with pericardial window in 2009 11/27/2012   Hx of ovarian cancer 11/27/2012   Hypothyroidism 11/27/2012   Iron deficiency anemia    Kidney carcinoma (HCC)    both kidneys; ~ 3 yr apart (08/19/2013)   Lung cancer (HCC) 2018   Melanoma of back (HCC)    Migraines    Pneumonia    several times; including today, RUL/notes 08/19/2013   Squamous carcinoma    face, side of my nose, chest; used acid to get rid of them (08/19/2013)   Venous insufficiency 11/27/2012    Past Surgical History:  Procedure Laterality  Date   ABDOMINAL HYSTERECTOMY  1979   ANTERIOR LAT LUMBAR FUSION Left 07/16/2018   Procedure: Left Lumbar two-three  Anterolateral decompression/interbody fusion with lateral plate fixation;  Surgeon: Colon Shove, MD;  Location: [facility];  Service: Neurosurgery;  Laterality: Left;   APPENDECTOMY     COLON RESECTION  07/2019   DOPPLER ECHOCARDIOGRAPHY  02/23/2011   LVEF>50%   FRACTURE SURGERY     KNEE ARTHROSCOPY Right    twice before the replacement (08/19/2013)   LEFT OOPHORECTOMY Left 1979   LUMBAR DISC SURGERY  1984   ruptured disc   MELANOMA EXCISION  ~ 2010   my lower back (08/19/2013)   ORIF HIP FRACTURE Left    x 2 age 87 and 4   PARTIAL NEPHRECTOMY Right 2005; ~ 2008   PERICARDIAL WINDOW  ~ 2012   POSTERIOR LUMBAR FUSION  2002?; 03/2012   REDUCTION MAMMAPLASTY Bilateral ~ 2008   RIGHT OOPHORECTOMY  ~ 2007   it had cancer in it (08/19/2013)   SHOULDER ARTHROSCOPY W/ ROTATOR CUFF REPAIR Right 1990's   TONSILLECTOMY  1954   TOTAL HIP ARTHROPLASTY Right 02/18/2019   Procedure: TOTAL HIP ARTHROPLASTY;  Surgeon: Josefina Chew, MD;  Location: [facility];  Service: Orthopedics;  Laterality: Right;   TOTAL HIP REVISION Right 03/27/2019   Procedure: TOTAL HIP REVISION;  Surgeon: Ernie Cough, MD;  Location: [facility];  Service: Orthopedics;  Laterality: Right;   TOTAL HIP REVISION Right 08/12/2020   Procedure: RIGHT TOTAL HIP REVISION TO CONSTRAINED LINER;  Surgeon: Ernie Cough, MD;  Location: [facility];  Service: Orthopedics;  Laterality: Right;  90 mins   TOTAL KNEE ARTHROPLASTY Right 1990's   TOTAL KNEE ARTHROPLASTY Left 08/02/2015   Procedure: LEFT TOTAL KNEE ARTHROPLASTY;  Surgeon: Cough Ernie, MD;  Location: [facility];  Service: Orthopedics;  Laterality: Left;   TOTAL SHOULDER REPLACEMENT Left 2006?   TUBAL LIGATION     VIDEO ASSISTED THORACOSCOPY (VATS)/WEDGE RESECTION Right 09/28/2017   Procedure: RIGHT VIDEO ASSISTED THORACOSCOPY (VATS)/WEDGE RESECTION, LYMPH NODE DISSECTION  ;  Surgeon: Kerrin Elspeth BROCKS, MD;  Location: [facility];  Service: Thoracic;  Laterality: Right;    Family History  Problem Relation Age of Onset   Cancer Mother 16       Stomach   Cancer Father 44       Lung cancer   Bipolar disorder Sister    Social History:  reports that she quit smoking about 38 years ago. Her smoking use included cigarettes. She started smoking about 56 years ago. She has a 27 pack-year smoking history. She has never used smokeless tobacco. She reports that she does not drink alcohol and does not use drugs.  Allergies:  Allergies  Allergen Reactions   Amoxicillin Other (See Comments)    UNSPECIFIED REACTION  Has patient had a PCN reaction causing immediate rash, facial/tongue/throat swelling, SOB or lightheadedness with hypotension: No Has patient had a PCN reaction causing severe rash involving mucus membranes or skin necrosis: No Has patient had a PCN reaction that required hospitalization No Has patient had a PCN reaction occurring within the last 10 years: No If all of the above answers are NO, then may proceed with Cephalosporin use.   Donepezil      Leg pain   Ampicillin Rash and Other (See Comments)    Has patient had a PCN reaction causing immediate rash, facial/tongue/throat swelling, SOB or lightheadedness with hypotension: No Has patient had a PCN reaction causing severe rash involving mucus membranes or skin necrosis: No Has patient had a PCN reaction that required hospitalization No Has patient had a PCN reaction occurring within the last 10 years: No If all of the above answers are NO, then may proceed with Cephalosporin use.    Medications Prior to Admission  Medication Sig Dispense Refill   acetaminophen  (TYLENOL ) 500 MG tablet Take 2 tablets (1,000 mg total) by mouth every 8 (eight) hours. 30 tablet 0   cyclobenzaprine  (FLEXERIL ) 10 MG tablet Take 1 tablet (10 mg total) by mouth every 12 (twelve) hours as needed. 60 tablet 1    escitalopram  (LEXAPRO ) 10 MG tablet Take 1 tablet (10 mg total) by mouth daily. 90 tablet 3   ferrous sulfate  (FERROUSUL) 325 (65 FE) MG tablet Take 1 tablet (325 mg total) by mouth 3 (three) times daily with meals. (Patient taking differently: Take 325 mg by mouth daily with breakfast.)  3   galantamine  (RAZADYNE ) 8 MG tablet Take 1 tablet (8 mg total) by mouth 2 (two) times a day. 180 tablet 3   levothyroxine  (SYNTHROID ) 100 MCG tablet Take 1 tablet by mouth in the morning on an empty stomach. 100 tablet 3   MAGNESIUM  PO Take 1 tablet by mouth at bedtime.     morphine  (MS CONTIN ) 30 MG 12 hr tablet Take 1 tablet (30 mg total) by mouth every 12 (twelve) hours. 60 tablet  0   albuterol  (VENTOLIN  HFA) 108 (90 Base) MCG/ACT inhaler Inhale 2 puffs into the lungs every 6 (six) hours as needed for wheezing or shortness of breath. (Patient not taking: Reported on 06/06/2024) 8 g 2   buPROPion  (WELLBUTRIN  SR) 150 MG 12 hr tablet TAKE 1 TABLET BY MOUTH DAILY (Patient not taking: Reported on 06/06/2024) 30 tablet 0   doxepin  (SINEQUAN ) 10 MG capsule Take 1 capsule (10 mg total) by mouth at bedtime. (Patient not taking: Reported on 06/06/2024) 30 capsule 5   furosemide  (LASIX ) 20 MG tablet Take 1 tablet (20 mg total) by mouth daily. (Patient not taking: Reported on 06/06/2024) 30 tablet 6   galantamine  (RAZADYNE ) 4 MG tablet Take 1 tablet by mouth every day x 2 weeks then increase to 1 tablet twice daily (Patient not taking: Reported on 06/06/2024) 60 tablet 5   methocarbamol  (ROBAXIN ) 500 MG tablet Take 1 tablet (500 mg total) by mouth every 6 (six) hours as needed for muscle spasms. (Patient not taking: Reported on 06/06/2024) 40 tablet 0   morphine  (MSIR) 30 MG tablet Take 1 tablet (30 mg total) by mouth 2 (two) times daily for severe pain (Patient not taking: Reported on 06/06/2024) 14 tablet 0   morphine  (MSIR) 30 MG tablet Take 1 tablet (30 mg total) by mouth 2 (two) times daily as needed for severe pain for 3 days  (Patient not taking: Reported on 06/06/2024) 6 tablet 0   morphine  (MSIR) 30 MG tablet Take 1 tablet (30 mg total) by mouth up to 2 times daily as needed for severe pain. (Patient not taking: Reported on 06/06/2024) 60 tablet 0   morphine  (MSIR) 30 MG tablet Take1 tablet Orally Up to 2 times a day as needed for severe pain. 60 tablet 0   [START ON 07/11/2024] morphine  (MSIR) 30 MG tablet Take 1 tablet (30 mg total) by mouth 2 (two) times daily as needed for severe pain. May fill 07/11/2024. 60 tablet 0    No results found for this or any previous visit (from the past 48 hours). No results found.  Review of Systems  Musculoskeletal:  Positive for back pain and gait problem.  Neurological:  Positive for weakness and numbness.  All other systems reviewed and are negative.   Blood pressure (!) 168/70, pulse 80, temperature 98.5 F (36.9 C), temperature source Oral, resp. rate 18, height 5' 5 (1.651 m), weight 72.6 kg, SpO2 97%. Physical Exam Constitutional:      Appearance: Normal appearance.  HENT:     Head: Normocephalic and atraumatic.     Right Ear: Tympanic membrane, ear canal and external ear normal.     Left Ear: Tympanic membrane, ear canal and external ear normal.     Nose: Nose normal.     Mouth/Throat:     Mouth: Mucous membranes are moist.     Pharynx: Oropharynx is clear.  Eyes:     Extraocular Movements: Extraocular movements intact.     Conjunctiva/sclera: Conjunctivae normal.     Pupils: Pupils are equal, round, and reactive to light.  Cardiovascular:     Rate and Rhythm: Normal rate and regular rhythm.     Pulses: Normal pulses.  Pulmonary:     Effort: Pulmonary effort is normal.     Breath sounds: Normal breath sounds.  Abdominal:     General: Abdomen is flat.     Palpations: Abdomen is soft.  Musculoskeletal:     Cervical back: Normal range of motion and neck  supple.     Comments: Positive straight leg raising at 15 degrees Patrick's maneuver is negative  bilaterally  Skin:    General: Skin is warm and dry.     Capillary Refill: Capillary refill takes less than 2 seconds.  Neurological:     Mental Status: She is alert.     Comments: Modest weakness in the iliopsoas and quadriceps at 4- out of 5 absent reflexes in the patella and the Achilles both  Cranial nerve examination upper extremity strength is within the limits of normal.  Psychiatric:        Mood and Affect: Mood normal.        Behavior: Behavior normal.        Thought Content: Thought content normal.        Judgment: Judgment normal.      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  History of fusion L3 to sacrum.  Plan: Sublaminar decompression L1-2 and L2-3.  Victory JINNY Gens, MD 07/08/2024, 7:40 AM

## 2024-07-08 NOTE — Anesthesia Procedure Notes (Signed)
 Procedure Name: Intubation Date/Time: 07/08/2024 7:58 AM  Performed by: Cindie Donald CROME, CRNAPre-anesthesia Checklist: Patient identified, Emergency Drugs available, Suction available and Patient being monitored Patient Re-evaluated:Patient Re-evaluated prior to induction Oxygen Delivery Method: Circle System Utilized Preoxygenation: Pre-oxygenation with 100% oxygen Induction Type: IV induction Ventilation: Mask ventilation without difficulty Laryngoscope Size: Mac and 3 Grade View: Grade I Tube type: Oral Tube size: 7.0 mm Number of attempts: 1 Airway Equipment and Method: Stylet Placement Confirmation: ETT inserted through vocal cords under direct vision, positive ETCO2 and breath sounds checked- equal and bilateral Secured at: 21 cm Tube secured with: Tape Dental Injury: Teeth and Oropharynx as per pre-operative assessment

## 2024-07-08 NOTE — Anesthesia Postprocedure Evaluation (Signed)
 Anesthesia Post Note  Patient: [name]  Procedure(s) Performed: LUMBAR LAMINECTOMY/DECOMPRESSION MICRODISCECTOMY 2 LEVELS (Back)     Patient location during evaluation: PACU Anesthesia Type: General Level of consciousness: awake and alert Pain management: pain level controlled Vital Signs Assessment: post-procedure vital signs reviewed and stable Respiratory status: spontaneous breathing, nonlabored ventilation, respiratory function stable and patient connected to nasal cannula oxygen Cardiovascular status: blood pressure returned to baseline and stable Postop Assessment: no apparent nausea or vomiting Anesthetic complications: no   No notable events documented.  Last Vitals:  Vitals:   07/08/24 1100 07/08/24 1115  BP: (!) 141/63 (!) 101/58  Pulse: 71 68  Resp: 14 13  Temp:    SpO2: 100% 97%    Last Pain:  Vitals:   07/08/24 1045  TempSrc:   PainSc: 10-Worst pain ever                 Thom JONELLE Peoples

## 2024-07-09 ENCOUNTER — Encounter (HOSPITAL_COMMUNITY): Payer: Self-pay | Admitting: Neurological Surgery

## 2024-07-09 ENCOUNTER — Other Ambulatory Visit (HOSPITAL_COMMUNITY): Payer: Self-pay

## 2024-07-09 DIAGNOSIS — M48062 Spinal stenosis, lumbar region with neurogenic claudication: Secondary | ICD-10-CM | POA: Diagnosis not present

## 2024-07-09 MED ORDER — LOPERAMIDE HCL 2 MG PO CAPS
2.0000 mg | ORAL_CAPSULE | ORAL | Status: DC | PRN
  Filled 2024-07-09: qty 2

## 2024-07-09 MED ORDER — FERROUS SULFATE 325 (65 FE) MG PO TABS: 325.0000 mg | ORAL_TABLET | Freq: Three times a day (TID) | ORAL | Status: AC

## 2024-07-09 MED ORDER — METHOCARBAMOL 500 MG PO TABS
500.0000 mg | ORAL_TABLET | Freq: Four times a day (QID) | ORAL | 3 refills | Status: AC | PRN
  Filled 2024-07-09: qty 30, 8d supply, fill #0
  Filled 2024-07-19: qty 30, 8d supply, fill #1
  Filled 2024-07-28: qty 30, 8d supply, fill #2
  Filled 2024-08-10: qty 30, 8d supply, fill #3

## 2024-07-09 NOTE — Progress Notes (Signed)
 PT Cancellation and Discharge Note  Patient Details Name: [name] MRN: [id number] DOB: Jul 06, 1951   Cancelled Treatment:    Reason Eval/Treat Not Completed: PT screened, no needs identified, will sign off   [name] 07/09/2024, 10:41 AM

## 2024-07-09 NOTE — Evaluation (Signed)
 Occupational Therapy Evaluation Patient Details Name: [name] MRN: [id number] DOB: [name] Today's Date: 07/09/2024   History of Present Illness   73 yo s/p microdiscectomy L1-2 L2-3 PMH arthritis, anxiety, CHF, CAD, dementia, depression, fibromyalgia, ovarian CA, kideny carcinoma, lung CA, [phone] back surg, R THA, BLE knee replacement     Clinical Impressions Patient is s/p microdiscectomy L1-2 L2-3 surgery resulting in functional limitations due to the deficits listed below (see OT problem list). Pt moving well with mod cues during session to reinforce precautions. Pt will have family (A) upon d/c and at adequate level.  Patient will benefit from skilled OT acutely to increase independence and safety with ADLS to allow discharge home.      If plan is discharge home, recommend the following:   Assist for transportation     Functional Status Assessment   Patient has had a recent decline in their functional status and demonstrates the ability to make significant improvements in function in a reasonable and predictable amount of time.     Equipment Recommendations   None recommended by OT     Recommendations for Other Services         Precautions/Restrictions   Precautions Precautions: Back Recall of Precautions/Restrictions: Intact Precaution/Restrictions Comments: back Restrictions Weight Bearing Restrictions Per Provider Order: No     Mobility Bed Mobility Overal bed mobility: Modified Independent             General bed mobility comments: cues for return to bed precautions with mod cues but no physical A    Transfers Overall transfer level: Modified independent Equipment used: Rolling walker (2 wheels)                      Balance Overall balance assessment: Mild deficits observed, not formally tested                                         ADL either performed or assessed with clinical judgement    ADL Overall ADL's : Needs assistance/impaired Eating/Feeding: Modified independent   Grooming: Modified independent               Lower Body Dressing: Supervision/safety;Sit to/from stand Lower Body Dressing Details (indicate cue type and reason): able to figure 4 cross to don socks Toilet Transfer: Supervision/safety;Rolling walker (2 wheels)           Functional mobility during ADLs: Modified independent;Rolling walker (2 wheels)       Vision Baseline Vision/History: 1 Wears glasses Ability to See in Adequate Light: 0 Adequate Vision Assessment?: No apparent visual deficits     Perception         Praxis         Pertinent Vitals/Pain Pain Assessment Pain Assessment: Faces Faces Pain Scale: Hurts whole lot Pain Location: back Pain Descriptors / Indicators: Discomfort, Grimacing Pain Intervention(s): Monitored during session, Repositioned, Limited activity within patient's tolerance     Extremity/Trunk Assessment Upper Extremity Assessment Upper Extremity Assessment: Overall WFL for tasks assessed   Lower Extremity Assessment Lower Extremity Assessment: Overall WFL for tasks assessed   Cervical / Trunk Assessment Cervical / Trunk Assessment: Back Surgery   Communication Communication Communication: No apparent difficulties   Cognition Arousal: Alert Behavior During Therapy: WFL for tasks assessed/performed Cognition: No apparent impairments             OT - Cognition  Comments: needs reinforcement of precautions                 Following commands: Intact       Cueing  General Comments   Cueing Techniques: Verbal cues      Exercises     Shoulder Instructions      Home Living Family/patient expects to be discharged to:: Private residence Living Arrangements: Spouse/significant other Available Help at Discharge: Family Type of Home: House   Entrance Stairs-Number of Steps: 4-5 Entrance Stairs-Rails: None Home Layout: One  level     Bathroom Shower/Tub: Producer, television/film/video: Handicapped height Bathroom Accessibility: Yes   Home Equipment: Agricultural consultant (2 wheels);Shower seat;Cane - single point          Prior Functioning/Environment Prior Level of Function : Independent/Modified Independent;Driving                    OT Problem List: Decreased activity tolerance;Impaired balance (sitting and/or standing);Decreased safety awareness;Decreased knowledge of use of DME or AE;Decreased knowledge of precautions   OT Treatment/Interventions: Self-care/ADL training;Therapeutic exercise;Energy conservation;DME and/or AE instruction;Therapeutic activities;Patient/family education      OT Goals(Current goals can be found in the care plan section)   Acute Rehab OT Goals Patient Stated Goal: to go home and sleep OT Goal Formulation: With patient Time For Goal Achievement: 07/23/24 Potential to Achieve Goals: Good   OT Frequency:  Min 2X/week    Co-evaluation              AM-PAC OT 6 Clicks Daily Activity     Outcome Measure Help from another person eating meals?: None Help from another person taking care of personal grooming?: None Help from another person toileting, which includes using toliet, bedpan, or urinal?: None Help from another person bathing (including washing, rinsing, drying)?: None Help from another person to put on and taking off regular upper body clothing?: None Help from another person to put on and taking off regular lower body clothing?: None 6 Click Score: 24   End of Session Equipment Utilized During Treatment: Gait belt;Rolling walker (2 wheels) Nurse Communication: Mobility status;Precautions  Activity Tolerance: Patient tolerated treatment well Patient left: in bed;with call bell/phone within reach  OT Visit Diagnosis: Unsteadiness on feet (R26.81);Muscle weakness (generalized) (M62.81)                Time: 9179-9160 OT Time Calculation  (min): 19 min Charges:  OT General Charges $OT Visit: 1 Visit OT Evaluation $OT Eval Moderate Complexity: 1 Mod   Brynn, OTR/L  Acute Rehabilitation Services Office: [phone] .   [name] 07/09/2024, 9:26 AM

## 2024-07-09 NOTE — Discharge Summary (Signed)
 Physician Discharge Summary  Patient ID: [name] MRN: [id number] DOB/AGE: [birth year] 73 y.o.  Admit date: 07/08/2024 Discharge date: 07/09/2024  Admission Diagnoses: Lumbar spinal stenosis with neurogenic claudication  Discharge Diagnoses: Lumbar spinal stenosis with neurogenic claudication Principal Problem:   Lumbar stenosis with neurogenic claudication   Discharged Condition: good  Hospital Course: Patient tolerated surgery well  Consults: None  Significant Diagnostic Studies: None  Treatments: surgery: See op note  Discharge Exam: Blood pressure (!) 130/45, pulse 74, temperature 97.8 F (36.6 C), temperature source Oral, resp. rate 16, height 5' 5 (1.651 m), weight 72.6 kg, SpO2 97%. Incision is clean and dry Station and gait are intact.  Disposition: Discharge disposition: 01-Home or Self Care       Discharge Instructions     Call MD for:  redness, tenderness, or signs of infection (pain, swelling, redness, odor or green/yellow discharge around incision site)   Complete by: As directed    Call MD for:  severe uncontrolled pain   Complete by: As directed    Call MD for:  temperature >100.4   Complete by: As directed    Diet - low sodium heart healthy   Complete by: As directed    Discharge wound care:   Complete by: As directed    Okay to shower. Do not apply salves or appointments to incision. No heavy lifting with the upper extremities greater than 10 pounds. May resume driving when not requiring pain medication and patient feels comfortable with doing so.   Incentive spirometry RT   Complete by: As directed    Increase activity slowly   Complete by: As directed       Allergies as of 07/09/2024       Reactions   Amoxicillin Other (See Comments)   UNSPECIFIED REACTION    Donepezil  Other (See Comments)   Leg pain   Ampicillin Rash        Medication List     TAKE these medications    albuterol  108 (90 Base) MCG/ACT inhaler Commonly  known as: VENTOLIN  HFA Inhale 2 puffs into the lungs every 6 (six) hours as needed for wheezing or shortness of breath.   Betasept  Surgical Scrub 4 % external liquid Generic drug: chlorhexidine  Apply 15 mLs (1 Application total) topically as directed for 30 doses. Use as directed daily for 5 days every other week for 6 weeks.   cyclobenzaprine  10 MG tablet Commonly known as: FLEXERIL  Take 1 tablet (10 mg total) by mouth every 12 (twelve) hours as needed. What changed: when to take this   escitalopram  10 MG tablet Commonly known as: LEXAPRO  Take 1 tablet (10 mg total) by mouth daily.   ferrous sulfate  325 (65 FE) MG tablet Commonly known as: FerrouSul Take 1 tablet (325 mg total) by mouth 3 (three) times daily with meals. What changed: when to take this   fluconazole 150 MG tablet Commonly known as: DIFLUCAN TAKE ONE TABLET BY MOUTH EVERY DAY FOR THREE DAYS   furosemide  20 MG tablet Commonly known as: LASIX  Take 1 tablet (20 mg total) by mouth daily.   galantamine  8 MG tablet Commonly known as: RAZADYNE  Take 1 tablet (8 mg total) by mouth 2 (two) times a day.   levothyroxine  100 MCG tablet Commonly known as: SYNTHROID  Take 1 tablet by mouth in the morning on an empty stomach.   MAGNESIUM  PO Take 2 tablets by mouth at bedtime.   methocarbamol  500 MG tablet Commonly known as: ROBAXIN  Take 1 tablet (  500 mg total) by mouth every 6 (six) hours as needed for muscle spasms.   morphine  30 MG tablet Commonly known as: MSIR Take1 tablet Orally Up to 2 times a day as needed for severe pain. What changed: when to take this   morphine  30 MG tablet Commonly known as: MSIR Take 1 tablet (30 mg total) by mouth 2 (two) times daily as needed for severe pain. May fill 07/11/2024. Start taking on: July 11, 2024 What changed: Another medication with the same name was changed. Make sure you understand how and when to take each.   mupirocin  ointment 2 % Commonly known as:  BACTROBAN  Place 1 Application into the nose 2 (two) times daily for 60 doses. Use as directed 2 times daily for 5 days every other week for 6 weeks.   OVER THE COUNTER MEDICATION Take 2 tablets by mouth at bedtime. [place]   Vitamin D (Ergocalciferol) 1.25 MG (50000 UNIT) Caps capsule Commonly known as: DRISDOL Take 50,000 Units by mouth once a week.               Discharge Care Instructions  (From admission, onward)           Start     Ordered   07/09/24 0000  Discharge wound care:       Comments: Okay to shower. Do not apply salves or appointments to incision. No heavy lifting with the upper extremities greater than 10 pounds. May resume driving when not requiring pain medication and patient feels comfortable with doing so.   07/09/24 0933             Signed: [name] 07/09/2024, 9:34 AM

## 2024-07-09 NOTE — Progress Notes (Signed)
 Patient alert and oriented, voided, ambulate. Surgical site clean and dry no sign of infection. D/c instructions explain and given all questions answered to the patient's satisfaction.

## 2024-07-11 ENCOUNTER — Other Ambulatory Visit (HOSPITAL_COMMUNITY): Payer: Self-pay

## 2024-07-15 ENCOUNTER — Other Ambulatory Visit (HOSPITAL_COMMUNITY): Payer: Self-pay

## 2024-07-19 ENCOUNTER — Other Ambulatory Visit (HOSPITAL_COMMUNITY): Payer: Self-pay

## 2024-07-21 ENCOUNTER — Other Ambulatory Visit (HOSPITAL_COMMUNITY): Payer: Self-pay

## 2024-07-24 ENCOUNTER — Other Ambulatory Visit (HOSPITAL_COMMUNITY): Payer: Self-pay

## 2024-07-25 ENCOUNTER — Other Ambulatory Visit (HOSPITAL_COMMUNITY): Payer: Self-pay

## 2024-07-25 MED ORDER — MORPHINE SULFATE ER BEADS 30 MG PO CP24
30.0000 mg | ORAL_CAPSULE | Freq: Three times a day (TID) | ORAL | 0 refills | Status: DC
  Filled 2024-07-25 – 2024-07-28 (×3): qty 60, 20d supply, fill #0

## 2024-07-26 ENCOUNTER — Other Ambulatory Visit (HOSPITAL_COMMUNITY): Payer: Self-pay

## 2024-07-28 ENCOUNTER — Other Ambulatory Visit (HOSPITAL_COMMUNITY): Payer: Self-pay

## 2024-07-29 ENCOUNTER — Other Ambulatory Visit (HOSPITAL_COMMUNITY): Payer: Self-pay

## 2024-07-29 MED ORDER — MORPHINE SULFATE 30 MG PO TABS: 30.0000 mg | ORAL_TABLET | Freq: Two times a day (BID) | ORAL | 0 refills | Status: DC | PRN

## 2024-07-30 ENCOUNTER — Other Ambulatory Visit (HOSPITAL_COMMUNITY): Payer: Self-pay

## 2024-07-30 MED ORDER — MORPHINE SULFATE 15 MG PO TABS
15.0000 mg | ORAL_TABLET | ORAL | 0 refills | Status: AC | PRN
  Filled 2024-07-30: qty 30, 5d supply, fill #0

## 2024-07-31 ENCOUNTER — Other Ambulatory Visit (HOSPITAL_COMMUNITY): Payer: Self-pay

## 2024-07-31 ENCOUNTER — Other Ambulatory Visit: Payer: Self-pay

## 2024-07-31 MED ORDER — MORPHINE SULFATE 30 MG PO TABS
30.0000 mg | ORAL_TABLET | Freq: Two times a day (BID) | ORAL | 0 refills | Status: AC | PRN
  Filled 2024-08-10: qty 60, 30d supply, fill #0

## 2024-08-01 ENCOUNTER — Other Ambulatory Visit (HOSPITAL_COMMUNITY): Payer: Self-pay

## 2024-08-01 MED ORDER — MORPHINE SULFATE 15 MG PO TABS
15.0000 mg | ORAL_TABLET | ORAL | 0 refills | Status: AC
  Filled 2024-08-06: qty 15, 3d supply, fill #0

## 2024-08-04 ENCOUNTER — Other Ambulatory Visit (HOSPITAL_COMMUNITY): Payer: Self-pay

## 2024-08-05 ENCOUNTER — Other Ambulatory Visit (HOSPITAL_COMMUNITY): Payer: Self-pay

## 2024-08-06 ENCOUNTER — Other Ambulatory Visit: Payer: Self-pay

## 2024-08-06 ENCOUNTER — Other Ambulatory Visit (HOSPITAL_COMMUNITY): Payer: Self-pay

## 2024-08-10 ENCOUNTER — Other Ambulatory Visit (HOSPITAL_COMMUNITY): Payer: Self-pay

## 2024-08-11 ENCOUNTER — Other Ambulatory Visit (HOSPITAL_COMMUNITY): Payer: Self-pay

## 2024-08-15 ENCOUNTER — Other Ambulatory Visit (HOSPITAL_COMMUNITY): Payer: Self-pay

## 2024-08-16 ENCOUNTER — Other Ambulatory Visit (HOSPITAL_COMMUNITY): Payer: Self-pay

## 2024-08-18 ENCOUNTER — Other Ambulatory Visit (HOSPITAL_COMMUNITY): Payer: Self-pay

## 2024-08-18 MED ORDER — JOURNAVX 50 MG PO TABS
50.0000 mg | ORAL_TABLET | Freq: Every day | ORAL | 0 refills | Status: AC
  Filled 2024-08-18: qty 30, 30d supply, fill #0

## 2024-08-19 ENCOUNTER — Other Ambulatory Visit (HOSPITAL_COMMUNITY): Payer: Self-pay

## 2024-08-21 ENCOUNTER — Other Ambulatory Visit (HOSPITAL_COMMUNITY): Payer: Self-pay

## 2024-08-27 ENCOUNTER — Encounter

## 2024-08-27 ENCOUNTER — Ambulatory Visit: Admitting: Nurse Practitioner

## 2024-09-01 ENCOUNTER — Other Ambulatory Visit (HOSPITAL_COMMUNITY): Payer: Self-pay

## 2024-09-01 MED ORDER — CALCITRIOL 0.25 MCG PO CAPS
0.2500 ug | ORAL_CAPSULE | Freq: Every day | ORAL | 12 refills | Status: AC
  Filled 2024-09-01: qty 30, 30d supply, fill #0
  Filled 2024-09-26 – 2024-10-01 (×2): qty 30, 30d supply, fill #1
  Filled 2024-10-26: qty 30, 30d supply, fill #2
  Filled 2024-11-25: qty 30, 30d supply, fill #3

## 2024-09-02 ENCOUNTER — Other Ambulatory Visit (HOSPITAL_COMMUNITY): Payer: Self-pay

## 2024-09-02 MED ORDER — MORPHINE SULFATE 30 MG PO TABS
30.0000 mg | ORAL_TABLET | Freq: Two times a day (BID) | ORAL | 0 refills | Status: AC
  Filled 2024-09-02 – 2024-09-09 (×2): qty 60, 30d supply, fill #0

## 2024-09-02 MED ORDER — LIDOCAINE 5 % EX PTCH
1.0000 | MEDICATED_PATCH | Freq: Every day | CUTANEOUS | 5 refills | Status: AC
  Filled 2024-09-02: qty 30, 30d supply, fill #0
  Filled 2024-09-26: qty 30, 30d supply, fill #1

## 2024-09-02 MED ORDER — MELOXICAM 15 MG PO TABS
15.0000 mg | ORAL_TABLET | Freq: Every day | ORAL | 1 refills | Status: AC
  Filled 2024-09-02: qty 30, 30d supply, fill #0
  Filled 2024-09-26 – 2024-10-01 (×2): qty 30, 30d supply, fill #1

## 2024-09-02 MED ORDER — CYCLOBENZAPRINE HCL 10 MG PO TABS
10.0000 mg | ORAL_TABLET | Freq: Two times a day (BID) | ORAL | 1 refills | Status: AC | PRN
  Filled 2024-09-02 – 2024-10-01 (×3): qty 60, 30d supply, fill #0
  Filled 2024-10-18 – 2024-10-24 (×2): qty 60, 30d supply, fill #1

## 2024-09-03 ENCOUNTER — Other Ambulatory Visit (HOSPITAL_COMMUNITY): Payer: Self-pay

## 2024-09-04 ENCOUNTER — Other Ambulatory Visit: Payer: Self-pay

## 2024-09-04 ENCOUNTER — Other Ambulatory Visit (HOSPITAL_COMMUNITY): Payer: Self-pay

## 2024-09-05 ENCOUNTER — Other Ambulatory Visit: Payer: Self-pay

## 2024-09-05 ENCOUNTER — Other Ambulatory Visit (HOSPITAL_COMMUNITY): Payer: Self-pay

## 2024-09-09 ENCOUNTER — Other Ambulatory Visit (HOSPITAL_COMMUNITY): Payer: Self-pay

## 2024-09-10 ENCOUNTER — Other Ambulatory Visit (HOSPITAL_COMMUNITY): Payer: Self-pay

## 2024-09-16 ENCOUNTER — Other Ambulatory Visit (HOSPITAL_COMMUNITY): Payer: Self-pay

## 2024-09-17 ENCOUNTER — Other Ambulatory Visit (HOSPITAL_COMMUNITY): Payer: Self-pay

## 2024-09-22 ENCOUNTER — Other Ambulatory Visit (HOSPITAL_COMMUNITY): Payer: Self-pay

## 2024-09-30 ENCOUNTER — Other Ambulatory Visit (HOSPITAL_COMMUNITY): Payer: Self-pay

## 2024-09-30 MED ORDER — MORPHINE SULFATE 30 MG PO TABS
30.0000 mg | ORAL_TABLET | Freq: Two times a day (BID) | ORAL | 0 refills | Status: DC | PRN
  Filled 2024-10-11: qty 60, 30d supply, fill #0

## 2024-10-01 ENCOUNTER — Other Ambulatory Visit (HOSPITAL_COMMUNITY): Payer: Self-pay

## 2024-10-11 ENCOUNTER — Other Ambulatory Visit (HOSPITAL_COMMUNITY): Payer: Self-pay

## 2024-10-13 ENCOUNTER — Other Ambulatory Visit: Payer: Self-pay | Admitting: Neurological Surgery

## 2024-10-13 DIAGNOSIS — M48062 Spinal stenosis, lumbar region with neurogenic claudication: Secondary | ICD-10-CM

## 2024-10-17 ENCOUNTER — Other Ambulatory Visit (HOSPITAL_COMMUNITY): Payer: Self-pay

## 2024-10-18 ENCOUNTER — Other Ambulatory Visit (HOSPITAL_COMMUNITY): Payer: Self-pay

## 2024-10-20 ENCOUNTER — Other Ambulatory Visit (HOSPITAL_COMMUNITY): Payer: Self-pay

## 2024-10-29 ENCOUNTER — Other Ambulatory Visit (HOSPITAL_COMMUNITY): Payer: Self-pay

## 2024-10-29 MED ORDER — METHOCARBAMOL 500 MG PO TABS
ORAL_TABLET | ORAL | 0 refills | Status: AC
  Filled 2024-10-29: qty 40, 10d supply, fill #0

## 2024-10-30 ENCOUNTER — Other Ambulatory Visit (HOSPITAL_COMMUNITY): Payer: Self-pay

## 2024-11-04 ENCOUNTER — Other Ambulatory Visit (HOSPITAL_COMMUNITY): Payer: Self-pay

## 2024-11-04 MED ORDER — MORPHINE SULFATE 30 MG PO TABS
30.0000 mg | ORAL_TABLET | Freq: Two times a day (BID) | ORAL | 0 refills | Status: AC | PRN
  Filled 2024-11-10: qty 60, 30d supply, fill #0

## 2024-11-10 ENCOUNTER — Other Ambulatory Visit (HOSPITAL_COMMUNITY): Payer: Self-pay

## 2024-11-17 ENCOUNTER — Other Ambulatory Visit (HOSPITAL_COMMUNITY): Payer: Self-pay

## 2024-11-18 ENCOUNTER — Other Ambulatory Visit (HOSPITAL_COMMUNITY): Payer: Self-pay

## 2024-11-28 ENCOUNTER — Other Ambulatory Visit
